# Patient Record
Sex: Male | Born: 1940 | Race: White | Hispanic: No | Smoking: Never smoker
Health system: Southern US, Community
[De-identification: ages and names within clinical notes are randomized; demographics above are authoritative.]

## PROBLEM LIST (undated history)

## (undated) DIAGNOSIS — E785 Hyperlipidemia, unspecified: Secondary | ICD-10-CM

## (undated) DIAGNOSIS — J849 Interstitial pulmonary disease, unspecified: Secondary | ICD-10-CM

## (undated) DIAGNOSIS — I639 Cerebral infarction, unspecified: Secondary | ICD-10-CM

## (undated) DIAGNOSIS — I714 Abdominal aortic aneurysm, without rupture, unspecified: Secondary | ICD-10-CM

## (undated) DIAGNOSIS — I219 Acute myocardial infarction, unspecified: Secondary | ICD-10-CM

## (undated) DIAGNOSIS — M1990 Unspecified osteoarthritis, unspecified site: Secondary | ICD-10-CM

## (undated) DIAGNOSIS — M810 Age-related osteoporosis without current pathological fracture: Secondary | ICD-10-CM

## (undated) DIAGNOSIS — I509 Heart failure, unspecified: Secondary | ICD-10-CM

## (undated) DIAGNOSIS — N289 Disorder of kidney and ureter, unspecified: Secondary | ICD-10-CM

## (undated) DIAGNOSIS — I2510 Atherosclerotic heart disease of native coronary artery without angina pectoris: Secondary | ICD-10-CM

## (undated) HISTORY — DX: Hyperlipidemia, unspecified: E78.5

## (undated) HISTORY — PX: CORONARY STENT PLACEMENT: SHX1402

## (undated) HISTORY — DX: Abdominal aortic aneurysm, without rupture: I71.4

## (undated) HISTORY — PX: JOINT REPLACEMENT: SHX530

## (undated) HISTORY — DX: Age-related osteoporosis without current pathological fracture: M81.0

## (undated) HISTORY — DX: Abdominal aortic aneurysm, without rupture, unspecified: I71.40

## (undated) HISTORY — DX: Disorder of kidney and ureter, unspecified: N28.9

## (undated) HISTORY — DX: Acute myocardial infarction, unspecified: I21.9

## (undated) HISTORY — DX: Heart failure, unspecified: I50.9

## (undated) HISTORY — PX: EYE SURGERY: SHX253

## (undated) HISTORY — PX: CORONARY ANGIOPLASTY: SHX604

## (undated) HISTORY — PX: KNEE SURGERY: SHX244

---

## 2008-11-09 ENCOUNTER — Ambulatory Visit: Payer: Self-pay | Admitting: Cardiology

## 2008-11-09 ENCOUNTER — Inpatient Hospital Stay (HOSPITAL_COMMUNITY): Admission: EM | Admit: 2008-11-09 | Discharge: 2008-11-13 | Payer: Self-pay | Admitting: Cardiology

## 2008-11-09 DIAGNOSIS — I219 Acute myocardial infarction, unspecified: Secondary | ICD-10-CM

## 2008-11-09 HISTORY — DX: Acute myocardial infarction, unspecified: I21.9

## 2008-11-11 ENCOUNTER — Encounter: Payer: Self-pay | Admitting: Cardiology

## 2008-11-21 ENCOUNTER — Ambulatory Visit: Payer: Self-pay | Admitting: Cardiology

## 2008-11-21 LAB — CONVERTED CEMR LAB
ALT: 17 units/L (ref 0–53)
AST: 17 units/L (ref 0–37)
Albumin: 3.5 g/dL (ref 3.5–5.2)
Alkaline Phosphatase: 52 units/L (ref 39–117)
BUN: 20 mg/dL (ref 6–23)
Basophils Absolute: 0.1 10*3/uL (ref 0.0–0.1)
Basophils Relative: 0.6 % (ref 0.0–3.0)
Bilirubin, Direct: 0.1 mg/dL (ref 0.0–0.3)
CO2: 29 meq/L (ref 19–32)
Calcium: 9.1 mg/dL (ref 8.4–10.5)
Chloride: 105 meq/L (ref 96–112)
Cholesterol: 132 mg/dL (ref 0–200)
Creatinine, Ser: 1.4 mg/dL (ref 0.4–1.5)
Eosinophils Absolute: 0.4 10*3/uL (ref 0.0–0.7)
Eosinophils Relative: 4.8 % (ref 0.0–5.0)
GFR calc Af Amer: 65 mL/min
GFR calc non Af Amer: 54 mL/min
Glucose, Bld: 210 mg/dL — ABNORMAL HIGH (ref 70–99)
HCT: 43 % (ref 39.0–52.0)
HDL: 33.7 mg/dL — ABNORMAL LOW (ref >39.0)
Hemoglobin: 15 g/dL (ref 13.0–17.0)
LDL Cholesterol: 70 mg/dL (ref 0–99)
Lymphocytes Relative: 22.2 % (ref 12.0–46.0)
MCHC: 34.9 g/dL (ref 30.0–36.0)
MCV: 93.6 fL (ref 78.0–100.0)
Monocytes Absolute: 0.9 10*3/uL (ref 0.1–1.0)
Monocytes Relative: 9.7 % (ref 3.0–12.0)
Neutro Abs: 5.7 10*3/uL (ref 1.4–7.7)
Neutrophils Relative %: 62.7 % (ref 43.0–77.0)
Platelets: 204 10*3/uL (ref 150–400)
Potassium: 4.4 meq/L (ref 3.5–5.1)
RBC: 4.6 M/uL (ref 4.22–5.81)
RDW: 12.3 % (ref 11.5–14.6)
Sodium: 138 meq/L (ref 135–145)
Total Bilirubin: 0.9 mg/dL (ref 0.3–1.2)
Total CHOL/HDL Ratio: 3.9
Total Protein: 7.2 g/dL (ref 6.0–8.3)
Triglycerides: 141 mg/dL (ref 0–149)
VLDL: 28 mg/dL (ref 0–40)
WBC: 9.1 10*3/uL (ref 4.5–10.5)

## 2008-12-02 ENCOUNTER — Encounter: Payer: Self-pay | Admitting: Cardiology

## 2008-12-02 ENCOUNTER — Ambulatory Visit: Payer: Self-pay

## 2008-12-02 ENCOUNTER — Ambulatory Visit: Payer: Self-pay | Admitting: Cardiology

## 2008-12-02 LAB — CONVERTED CEMR LAB
BUN: 16 mg/dL (ref 6–23)
CO2: 29 meq/L (ref 19–32)
Calcium: 9.2 mg/dL (ref 8.4–10.5)
Chloride: 104 meq/L (ref 96–112)
Creatinine, Ser: 1.3 mg/dL (ref 0.4–1.5)
GFR calc non Af Amer: 58.48 mL/min (ref >60)
Glucose, Bld: 208 mg/dL — ABNORMAL HIGH (ref 70–99)
INR: 1 (ref 0.8–1.0)
Potassium: 4.4 meq/L (ref 3.5–5.1)
Prothrombin Time: 11.1 s (ref 10.9–13.3)
Sodium: 140 meq/L (ref 135–145)

## 2008-12-06 ENCOUNTER — Inpatient Hospital Stay (HOSPITAL_COMMUNITY): Admission: RE | Admit: 2008-12-06 | Discharge: 2008-12-07 | Payer: Self-pay | Admitting: Cardiology

## 2008-12-06 ENCOUNTER — Ambulatory Visit: Payer: Self-pay | Admitting: Cardiology

## 2008-12-26 ENCOUNTER — Encounter: Payer: Self-pay | Admitting: Cardiology

## 2008-12-26 ENCOUNTER — Ambulatory Visit: Payer: Self-pay | Admitting: Cardiology

## 2008-12-26 DIAGNOSIS — I2510 Atherosclerotic heart disease of native coronary artery without angina pectoris: Secondary | ICD-10-CM

## 2009-01-27 ENCOUNTER — Ambulatory Visit: Payer: Self-pay | Admitting: Cardiology

## 2009-01-27 DIAGNOSIS — E7800 Pure hypercholesterolemia, unspecified: Secondary | ICD-10-CM

## 2009-01-27 DIAGNOSIS — E119 Type 2 diabetes mellitus without complications: Secondary | ICD-10-CM

## 2009-05-12 ENCOUNTER — Ambulatory Visit: Payer: Self-pay | Admitting: Cardiology

## 2009-05-21 ENCOUNTER — Telehealth: Payer: Self-pay | Admitting: Cardiology

## 2009-05-22 ENCOUNTER — Telehealth: Payer: Self-pay | Admitting: Cardiology

## 2009-06-11 ENCOUNTER — Telehealth: Payer: Self-pay | Admitting: Cardiology

## 2009-07-03 ENCOUNTER — Telehealth: Payer: Self-pay | Admitting: Cardiology

## 2009-07-19 ENCOUNTER — Encounter: Payer: Self-pay | Admitting: Cardiology

## 2009-07-28 ENCOUNTER — Telehealth: Payer: Self-pay | Admitting: Cardiology

## 2009-10-06 ENCOUNTER — Telehealth: Payer: Self-pay | Admitting: Cardiology

## 2009-10-09 ENCOUNTER — Encounter (INDEPENDENT_AMBULATORY_CARE_PROVIDER_SITE_OTHER): Payer: Self-pay

## 2009-10-09 ENCOUNTER — Ambulatory Visit: Payer: Self-pay | Admitting: Cardiology

## 2009-10-10 ENCOUNTER — Inpatient Hospital Stay (HOSPITAL_COMMUNITY): Admission: EM | Admit: 2009-10-10 | Discharge: 2009-10-14 | Payer: Self-pay | Admitting: Emergency Medicine

## 2009-10-10 ENCOUNTER — Ambulatory Visit: Payer: Self-pay | Admitting: Cardiovascular Disease

## 2009-10-14 ENCOUNTER — Encounter: Payer: Self-pay | Admitting: Cardiology

## 2009-10-14 ENCOUNTER — Telehealth: Payer: Self-pay | Admitting: Cardiovascular Disease

## 2009-10-15 ENCOUNTER — Telehealth: Payer: Self-pay | Admitting: Cardiology

## 2009-10-28 ENCOUNTER — Telehealth: Payer: Self-pay | Admitting: Cardiology

## 2009-11-06 ENCOUNTER — Ambulatory Visit: Payer: Self-pay | Admitting: Cardiology

## 2009-12-08 ENCOUNTER — Ambulatory Visit: Payer: Self-pay | Admitting: Cardiology

## 2009-12-09 LAB — CONVERTED CEMR LAB
BUN: 19 mg/dL (ref 6–23)
Basophils Absolute: 0 10*3/uL (ref 0.0–0.1)
Basophils Relative: 0.1 % (ref 0.0–3.0)
CO2: 27 meq/L (ref 19–32)
Calcium: 9.2 mg/dL (ref 8.4–10.5)
Chloride: 102 meq/L (ref 96–112)
Cholesterol: 126 mg/dL (ref 0–200)
Creatinine, Ser: 1.4 mg/dL (ref 0.4–1.5)
Direct LDL: 67.7 mg/dL
Eosinophils Absolute: 0.3 10*3/uL (ref 0.0–0.7)
Eosinophils Relative: 4 % (ref 0.0–5.0)
GFR calc non Af Amer: 53.53 mL/min (ref >60)
Glucose, Bld: 350 mg/dL — ABNORMAL HIGH (ref 70–99)
HCT: 49 % (ref 39.0–52.0)
HDL: 38.5 mg/dL — ABNORMAL LOW (ref >39.00)
Hemoglobin: 16.3 g/dL (ref 13.0–17.0)
Lymphocytes Relative: 23.8 % (ref 12.0–46.0)
Lymphs Abs: 1.7 10*3/uL (ref 0.7–4.0)
MCHC: 33.3 g/dL (ref 30.0–36.0)
MCV: 94.8 fL (ref 78.0–100.0)
Monocytes Absolute: 0.8 10*3/uL (ref 0.1–1.0)
Monocytes Relative: 11.1 % (ref 3.0–12.0)
Neutro Abs: 4.3 10*3/uL (ref 1.4–7.7)
Neutrophils Relative %: 61 % (ref 43.0–77.0)
Platelets: 172 10*3/uL (ref 150.0–400.0)
Potassium: 5.3 meq/L — ABNORMAL HIGH (ref 3.5–5.1)
RBC: 5.17 M/uL (ref 4.22–5.81)
RDW: 12.2 % (ref 11.5–14.6)
Sodium: 136 meq/L (ref 135–145)
Total CHOL/HDL Ratio: 3
Triglycerides: 236 mg/dL — ABNORMAL HIGH (ref 0.0–149.0)
VLDL: 47.2 mg/dL — ABNORMAL HIGH (ref 0.0–40.0)
WBC: 7.1 10*3/uL (ref 4.5–10.5)

## 2009-12-21 ENCOUNTER — Ambulatory Visit: Payer: Self-pay | Admitting: Internal Medicine

## 2009-12-21 ENCOUNTER — Telehealth: Payer: Self-pay | Admitting: Nurse Practitioner

## 2010-01-19 ENCOUNTER — Ambulatory Visit: Payer: Self-pay | Admitting: Cardiology

## 2010-05-04 ENCOUNTER — Ambulatory Visit: Payer: Self-pay | Admitting: Cardiology

## 2010-05-04 DIAGNOSIS — I959 Hypotension, unspecified: Secondary | ICD-10-CM

## 2010-05-14 LAB — CONVERTED CEMR LAB
BUN: 16 mg/dL (ref 6–23)
Basophils Absolute: 0.1 10*3/uL (ref 0.0–0.1)
Basophils Relative: 0.7 % (ref 0.0–3.0)
CO2: 26 meq/L (ref 19–32)
Calcium: 9.6 mg/dL (ref 8.4–10.5)
Chloride: 105 meq/L (ref 96–112)
Creatinine, Ser: 1.3 mg/dL (ref 0.4–1.5)
Eosinophils Absolute: 0.3 10*3/uL (ref 0.0–0.7)
Eosinophils Relative: 3.7 % (ref 0.0–5.0)
GFR calc non Af Amer: 57.72 mL/min (ref >60)
Glucose, Bld: 190 mg/dL — ABNORMAL HIGH (ref 70–99)
HCT: 44 % (ref 39.0–52.0)
Hemoglobin: 15.3 g/dL (ref 13.0–17.0)
Lymphocytes Relative: 23.8 % (ref 12.0–46.0)
Lymphs Abs: 2.2 10*3/uL (ref 0.7–4.0)
MCHC: 34.9 g/dL (ref 30.0–36.0)
MCV: 95.6 fL (ref 78.0–100.0)
Monocytes Absolute: 0.9 10*3/uL (ref 0.1–1.0)
Monocytes Relative: 10.2 % (ref 3.0–12.0)
Neutro Abs: 5.6 10*3/uL (ref 1.4–7.7)
Neutrophils Relative %: 61.6 % (ref 43.0–77.0)
Platelets: 192 10*3/uL (ref 150.0–400.0)
Potassium: 5 meq/L (ref 3.5–5.1)
RBC: 4.6 M/uL (ref 4.22–5.81)
RDW: 13.1 % (ref 11.5–14.6)
Sodium: 137 meq/L (ref 135–145)
WBC: 9 10*3/uL (ref 4.5–10.5)

## 2010-06-30 ENCOUNTER — Ambulatory Visit: Payer: Self-pay | Admitting: Cardiology

## 2010-06-30 ENCOUNTER — Ambulatory Visit (HOSPITAL_COMMUNITY): Admission: RE | Admit: 2010-06-30 | Discharge: 2010-06-30 | Payer: Self-pay | Admitting: Cardiology

## 2010-06-30 DIAGNOSIS — R05 Cough: Secondary | ICD-10-CM

## 2010-06-30 DIAGNOSIS — R059 Cough, unspecified: Secondary | ICD-10-CM | POA: Insufficient documentation

## 2010-07-01 ENCOUNTER — Telehealth: Payer: Self-pay | Admitting: Cardiology

## 2010-07-02 LAB — CONVERTED CEMR LAB
BUN: 26 mg/dL — ABNORMAL HIGH (ref 6–23)
Basophils Absolute: 0.1 10*3/uL (ref 0.0–0.1)
Basophils Relative: 0.7 % (ref 0.0–3.0)
CO2: 23 meq/L (ref 19–32)
Calcium: 9.3 mg/dL (ref 8.4–10.5)
Chloride: 107 meq/L (ref 96–112)
Creatinine, Ser: 1.8 mg/dL — ABNORMAL HIGH (ref 0.4–1.5)
Eosinophils Absolute: 0.4 10*3/uL (ref 0.0–0.7)
Eosinophils Relative: 3.4 % (ref 0.0–5.0)
GFR calc non Af Amer: 39.99 mL/min (ref >60)
Glucose, Bld: 197 mg/dL — ABNORMAL HIGH (ref 70–99)
HCT: 43.9 % (ref 39.0–52.0)
Hemoglobin: 15 g/dL (ref 13.0–17.0)
Lymphocytes Relative: 18.1 % (ref 12.0–46.0)
Lymphs Abs: 2.1 10*3/uL (ref 0.7–4.0)
MCHC: 34.3 g/dL (ref 30.0–36.0)
MCV: 95.6 fL (ref 78.0–100.0)
Monocytes Absolute: 0.8 10*3/uL (ref 0.1–1.0)
Monocytes Relative: 6.7 % (ref 3.0–12.0)
Neutro Abs: 8.2 10*3/uL — ABNORMAL HIGH (ref 1.4–7.7)
Neutrophils Relative %: 71.1 % (ref 43.0–77.0)
Platelets: 212 10*3/uL (ref 150.0–400.0)
Potassium: 4.4 meq/L (ref 3.5–5.1)
RBC: 4.59 M/uL (ref 4.22–5.81)
RDW: 12.5 % (ref 11.5–14.6)
Sodium: 137 meq/L (ref 135–145)
WBC: 11.5 10*3/uL — ABNORMAL HIGH (ref 4.5–10.5)

## 2010-07-28 ENCOUNTER — Encounter: Payer: Self-pay | Admitting: Cardiology

## 2010-08-14 ENCOUNTER — Encounter: Payer: Self-pay | Admitting: Cardiology

## 2010-08-14 ENCOUNTER — Ambulatory Visit: Payer: Self-pay | Admitting: Cardiology

## 2010-08-14 DIAGNOSIS — J984 Other disorders of lung: Secondary | ICD-10-CM

## 2010-08-20 ENCOUNTER — Inpatient Hospital Stay (HOSPITAL_COMMUNITY): Admission: EM | Admit: 2010-08-20 | Discharge: 2009-12-23 | Payer: Self-pay | Admitting: Emergency Medicine

## 2010-10-13 NOTE — Assessment & Plan Note (Signed)
Summary: f41m   Visit Type:  3 months follow up Primary Provider:  Mechele Collin  CC:  Low blood pressure.  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  CAD: No chest pain, SOB, orthopnea or PND. He does not report any swelling in extremities. He can walk >2blocks without getting any chest pain/SOB. 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  HTN: He has been having Low BP recently with an episode of dizzyness last week after working out in sun. His BP at that time was 72/50.   Diabetes is under better control; acc to him but he dioes not remember his last HBa1c. Hba1c in hospital in April was 10.4.  No other complaints.  Problems Prior to Update: 1)  Hypotension  (ICD-458.9) 2)  Aodm  (ICD-250.00) 3)  Aodm  (ICD-250.00) 4)  Hypercholesterolemia Iia  (ICD-272.0) 5)  Cad, Native Vessel  (ICD-414.01)  Medications Prior to Update: 1)  Toprol Xl 50 Mg Xr24h-Tab (Metoprolol Succinate) .... Take 1 Tablet By Mouth Once A Day 2)  Nitroglycerin 0.4 Mg Subl (Nitroglycerin) .... One Tablet Under Tongue Every 5 Minutes As Needed For Chest Pain---May Repeat Times Three 3)  Metformin Hcl 500 Mg Tabs (Metformin Hcl) .... Take 1 Tablet By Mouth Two Times A Day 4)  Levemir 100 Unit/ml Soln (Insulin Detemir) .... 40 Units Daily 5)  Simvastatin 40 Mg Tabs (Simvastatin) .... Take One Tablet By Mouth Daily At Bedtime 6)  Glipizide 10 Mg Tabs (Glipizide) .... Take 1 Tablet By Mouth Two Times A Day 7)  Isosorbide Mononitrate Cr 30 Mg Xr24h-Tab (Isosorbide Mononitrate) .... Take One-Half  Tablet By Mouth Daily 8)  Effient 10 Mg Tabs (Prasugrel Hcl) .... Take 1 Tablet By Mouth Once A Day  Current Medications (verified): 1)  Toprol Xl 50 Mg Xr24h-Tab (Metoprolol Succinate) .... Take 1 Tablet By Mouth Once A Day 2)   Nitroglycerin 0.4 Mg Subl (Nitroglycerin) .... One Tablet Under Tongue Every 5 Minutes As Needed For Chest Pain---May Repeat Times Three 3)  Metformin Hcl 500 Mg Tabs (Metformin Hcl) .... Take 1 Tablet By Mouth Two Times A Day 4)  Levemir 100 Unit/ml Soln (Insulin Detemir) .... 50 Units At Night and 10 Units Am. 5)  Simvastatin 40 Mg Tabs (Simvastatin) .... Take One Tablet By Mouth Daily At Bedtime 6)  Glipizide 10 Mg Tabs (Glipizide) .... Take 1 Tablet By Mouth Two Times A Day 7)  Isosorbide Mononitrate Cr 30 Mg Xr24h-Tab (Isosorbide Mononitrate) .... Take One-Half  Tablet By Mouth Daily 8)  Effient 10 Mg Tabs (Prasugrel Hcl) .... Take 1 Tablet By Mouth Once A Day 9)  Lisinopril 2.5 Mg Tabs (Lisinopril) .... Take One Tablet By Mouth Daily  Allergies (verified): No Known Drug Allergies  Past History:  Past Medical History: Last updated: 11/20/2008  1. Diabetes.   2. Dyslipidemia.   3. Renal insufficiency  4. Myocardial infarction on February 27/10    Past Surgical History: Last updated: 11/20/2008 Knee surgery  Family History: Last updated: 11/20/2008  Positive for CAD.  He has had 2 brothers, died from   acute MIs.   Social History: Last updated: 11/20/2008 The patient lives in [place] with his family.   He denies any tobacco use.   Family History: Reviewed history from 11/20/2008  and no changes required.  Positive for CAD.  He has had 2 brothers, died from   acute MIs.   Social History: Reviewed history from 11/20/2008 and no changes required. The patient lives in [place] with his family.   He denies any tobacco use.   Review of Systems      See HPI  Vital Signs:  Patient profile:   70 year old male Height:      69 inches Weight:      192.25 pounds BMI:     28.49 Pulse rate:   74 / minute Pulse rhythm:   regular Resp:     18 per minute BP sitting:   90 / 60  (left arm) Cuff size:   large  Vitals Entered By: Vikki Ports (May 04, 2010 9:55 AM)  Physical Exam  Additional Exam:  Gen: AOx3, in no acute distress Eyes: PERRL, EOMI ENT:MMM, No erythema noted in posterior pharynx Neck: No JVD, No LAP Chest: Bibasilar crackles,  good respiratory effort, no  CVS: regular rhythmic rate, NO M/R/G, S1 S2 normal Abdo: soft,ND, BS+x4, Non tender and No hepatosplenomegaly EXT: No odema noted Neuro: Non focal, gait is normal Skin: no rashes noted.    Cardiac Cath  Procedure date:  12/22/2009  Findings:      FINAL ASSESSMENT: 1. Severe ostial diagonal stenosis with successful cutting balloon     angioplasty. 2. Continued patency of the left anterior descending (coronary artery)     and obtuse marginal stents with otherwise nonobstructive coronary     artery disease.   RECOMMENDATIONS:  We will continue the patient's dual antiplatelet therapy with aspirin and Plavix and we will check P2Y12 platelet testing in the morning to see if the patient may benefit from prasugrel.  EKG  Procedure date:  05/04/2010  Findings:      NSR. RBBB.  Inferior MI, old.  LAFB.  Bifascicular block.  Impression & Recommendations:  Problem # 1:  CAD, NATIVE VESSEL (ICD-414.01) Assessment Unchanged No chest pain or SOB. On aspirin and prsugrel as he was Plavix non responder.  The following medications were removed from the medication list:    Isosorbide Mononitrate Cr 30 Mg Xr24h-tab (Isosorbide mononitrate) .Marland Kitchen... Take one-half  tablet by mouth daily His updated medication list for this problem includes:    Toprol Xl 50 Mg Xr24h-tab (Metoprolol succinate) .Marland Kitchen... Take 1 tablet by mouth once a day    Nitroglycerin 0.4 Mg Subl (Nitroglycerin) ..... One tablet under tongue every 5 minutes as needed for chest pain---may repeat times three    Effient 10 Mg Tabs (Prasugrel hcl) .Marland Kitchen... Take 1 tablet by mouth once a day  Orders: EKG w/ Interpretation (93000) TLB-BMP (Basic Metabolic Panel-BMET) (80048-METABOL) TLB-CBC Platelet -  w/Differential (85025-CBCD)  < Template too large >  Problem # 2:  HYPOTENSION (ICD-458.9) Assessment: New Patient has had a couple fo very low BP readings recently specially last week when dixxy along with BP 72/50. We will stop Isosorbide nitrate and Lisinopril at this time. Have him take his BP readings repeatedly in next 4 weeks adn follow up after 4 weeks. We will check his CBC as this could be related to Bleeding as he is on prasulgrel which has higher risk of bleeding as compared to plavix. Repeat BP checked manually was 104/68.  Problem # 3:  AODM (ICD-250.00) Assessment: Unchanged Managed by Dr Pollyann Kennedy up in [place], [place]. He says that he is improving with his CBG levels. His updated medication  list for this problem includes:    Metformin Hcl 500 Mg Tabs (Metformin hcl) .Marland Kitchen... Take 1 tablet by mouth two times a day    Levemir 100 Unit/ml Soln (Insulin detemir) .Marland KitchenMarland KitchenMarland KitchenMarland Kitchen 50 units at night and 10 units am.    Glipizide 10 Mg Tabs (Glipizide) .Marland Kitchen... Take 1 tablet by mouth two times a day  Orders: EKG w/ Interpretation (93000) TLB-BMP (Basic Metabolic Panel-BMET) (80048-METABOL) TLB-CBC Platelet - w/Differential (85025-CBCD)  Labs Reviewed: Creat: 1.4 (12/08/2009)     Problem # 4:  HYPERCHOLESTEROLEMIA  IIA (ICD-272.0) Assessment: Unchanged Continue Simvastatin 40. His updated medication list for this problem includes:    Simvastatin 40 Mg Tabs (Simvastatin) .Marland Kitchen... Take one tablet by mouth daily at bedtime  Orders: EKG w/ Interpretation (93000) TLB-BMP (Basic Metabolic Panel-BMET) (80048-METABOL) TLB-CBC Platelet - w/Differential (85025-CBCD)  CHOL: 126 (12/08/2009)   LDL: 70 (11/21/2008)   HDL: 38.50 (12/08/2009)   TG: 236.0 (12/08/2009)  Patient Instructions: 1)  Your physician recommends that you schedule a follow-up appointment in: 1 MONTH 2)  Your physician recommends that you have lab work today: CBC, BMP 3)  Your physician has recommended you make the following change  in your medication: STOP Lisinopril, STOP Isosorbide MN  Vital Signs:  Patient profile:   70 year old male Height:      69 inches Weight:      192.25 pounds BMI:     28.49 Pulse rate:   74 / minute Pulse rhythm:   regular Resp:     18 per minute BP sitting:   90 / 60  (left arm) Cuff size:   large  Vitals Entered By: Vikki Ports (May 04, 2010 9:55 AM)   Problems Prior to Update: 1)  Hypotension  (ICD-458.9) 2)  Aodm  (ICD-250.00) 3)  Aodm  (ICD-250.00) 4)  Hypercholesterolemia Iia  (ICD-272.0) 5)  Cad, Native Vessel  (ICD-414.01)  Medications Prior to Update: 1)  Toprol Xl 50 Mg Xr24h-Tab (Metoprolol Succinate) .... Take 1 Tablet By Mouth Once A Day 2)  Nitroglycerin 0.4 Mg Subl (Nitroglycerin) .... One Tablet Under Tongue Every 5 Minutes As Needed For Chest Pain---May Repeat Times Three 3)  Metformin Hcl 500 Mg Tabs (Metformin Hcl) .... Take 1 Tablet By Mouth Two Times A Day 4)  Levemir 100 Unit/ml Soln (Insulin Detemir) .... 40 Units Daily 5)  Simvastatin 40 Mg Tabs (Simvastatin) .... Take One Tablet By Mouth Daily At Bedtime 6)  Glipizide 10 Mg Tabs (Glipizide) .... Take 1 Tablet By Mouth Two Times A Day 7)  Isosorbide Mononitrate Cr 30 Mg Xr24h-Tab (Isosorbide Mononitrate) .... Take One-Half  Tablet By Mouth Daily 8)  Effient 10 Mg Tabs (Prasugrel Hcl) .... Take 1 Tablet By Mouth Once A Day  Current Medications (verified): 1)  Toprol Xl 50 Mg Xr24h-Tab (Metoprolol Succinate) .... Take 1 Tablet By Mouth Once A Day 2)  Nitroglycerin 0.4 Mg Subl (Nitroglycerin) .... One Tablet Under Tongue Every 5 Minutes As Needed For Chest Pain---May Repeat Times Three 3)  Metformin Hcl 500 Mg Tabs (Metformin Hcl) .... Take 1 Tablet By Mouth Two Times A Day 4)  Levemir 100 Unit/ml Soln (Insulin Detemir) .... 50 Units At Night and 10 Units Am. 5)  Simvastatin 40 Mg Tabs (Simvastatin) .... Take One Tablet By Mouth Daily At Bedtime 6)  Glipizide 10 Mg Tabs (Glipizide) .... Take 1 Tablet  By Mouth Two Times A Day 7)  Isosorbide Mononitrate Cr 30 Mg Xr24h-Tab (Isosorbide Mononitrate) .... Take One-Half  Tablet By  Mouth Daily 8)  Effient 10 Mg Tabs (Prasugrel Hcl) .... Take 1 Tablet By Mouth Once A Day 9)  Lisinopril 2.5 Mg Tabs (Lisinopril) .... Take One Tablet By Mouth Daily  Allergies (verified): No Known Drug Allergies   Patient Instructions: 1)  Your physician recommends that you schedule a follow-up appointment in: 1 MONTH 2)  Your physician recommends that you have lab work today: CBC, BMP 3)  Your physician has recommended you make the following change in your medication: STOP Lisinopril, STOP Isosorbide MN  Appended Document: f11m Dr Eben Burow and I saw the patient together.  He was evaluated, and I agree with the notes. CBC and BMET were checked, and were satisfactory.  TS

## 2010-10-13 NOTE — Progress Notes (Signed)
Summary: Cardiology Phone Note - Chest Pain  Phone Note Call from Patient   Caller: Spouse Summary of Call: received call from ms. Theroux on the evening of 4/9, stating that her husband, the patient, developed c/p @ rest not relieved after 2 sl ntg tabs.  i advised that they call 911 immediatley for transport to their local hospital [facility]).  She agreed to call 911 and was grateful for the call back. Initial call taken by: Creig Hines, ANP-BC,  December 21, 2009 10:20 AM

## 2010-10-13 NOTE — Progress Notes (Signed)
Summary: Med Change  Phone Note Call from Patient   Caller: Lucas Morgan's wife Summary of Call: I spoke with the Lucas Morgan's wife and the Lucas Morgan is taking Lisinopril and had stopped Toprol XL.  I instructed her to STOP Lisinopril and START Toprol XL.  Rx sent to pharmacy.  I will have Dr Riley Kill review the Lucas Morgan's chest x-ray and labs.  Initial call taken by: Julieta Gutting, RN, BSN,  July 01, 2010 9:28 AM  Follow-up for Phone Call        Lucas Morgan wife states pharmacy says rx has not been called in. Lucas Morgan wife wants to know lab results. Follow-up by: Roe Coombs,  July 01, 2010 1:13 PM  Additional Follow-up for Phone Call Additional follow up Details #1::        Prescription called into the pharmacy. Julieta Gutting, RN, BSN  July 01, 2010 2:19 PM     Additional Follow-up for Phone Call Additional follow up Details #2::    Dr Riley Kill called and spoke with the Lucas Morgan's wife about lab and chest x-ray results.  Dr Riley Kill would like the Lucas Morgan to start Levaquin 500mg  by mouth once daily for 7 days #7, RF O.  Rx called to the pharmacy. Dr Riley Kill will call the Lucas Morgan's PCP tomorrow to arrange OV and CT scan.  Follow-up by: Julieta Gutting, RN, BSN,  July 01, 2010 6:58 PM  New/Updated Medications: LEVAQUIN 500 MG TABS (LEVOFLOXACIN) take one tablet by mouth daily for 7 days Prescriptions: TOPROL XL 50 MG XR24H-TAB (METOPROLOL SUCCINATE) Take 1 tablet by mouth once a day  #30 x 6   Entered by:   Julieta Gutting, RN, BSN   Authorized by:   Ronaldo Miyamoto, MD, [facility]   Signed by:   Julieta Gutting, RN, BSN on 07/01/2010   Method used:   Electronically to        [facility] [address] (retail)       [address] [place]  [address]       Ph: [phone]       Fax: [phone]   RxID:   [id number]

## 2010-10-13 NOTE — Assessment & Plan Note (Signed)
Summary: 6wk f/u   Visit Type:  POST-HOSPITAL Primary Provider:  Mechele Collin  CC:  No complains.  History of Present Illness: Feeling really good at this point.  No chest pain.  Mowing grass.  Rides a riding mower.     Current Medications (verified): 1)  Toprol Xl 50 Mg Xr24h-Tab (Metoprolol Succinate) .... Take 1 Tablet By Mouth Once A Day 2)  Nitroglycerin 0.4 Mg Subl (Nitroglycerin) .... One Tablet Under Tongue Every 5 Minutes As Needed For Chest Pain---May Repeat Times Three 3)  Metformin Hcl 500 Mg Tabs (Metformin Hcl) .... Take 1 Tablet By Mouth Two Times A Day 4)  Levemir 100 Unit/ml Soln (Insulin Detemir) .... 40 Units Daily 5)  Simvastatin 40 Mg Tabs (Simvastatin) .... Take One Tablet By Mouth Daily At Bedtime 6)  Glipizide 10 Mg Tabs (Glipizide) .... Take 1 Tablet By Mouth Two Times A Day 7)  Isosorbide Mononitrate Cr 30 Mg Xr24h-Tab (Isosorbide Mononitrate) .... Take One-Half  Tablet By Mouth Daily 8)  Effient 10 Mg Tabs (Prasugrel Hcl) .... Take 1 Tablet By Mouth Once A Day  Allergies (verified): No Known Drug Allergies  Vital Signs:  Patient profile:   70 year old male Height:      69 inches Weight:      196 pounds BMI:     29.05 Pulse rate:   70 / minute Pulse rhythm:   regular Resp:     18 per minute BP sitting:   102 / 68  (left arm) Cuff size:   large  Vitals Entered By: Vikki Ports (Jan 19, 2010 12:34 PM)  Physical Exam  General:  Well developed, well nourished, in no acute distress. Head:  normocephalic and atraumatic Eyes:  PERRLA/EOM intact; conjunctiva and lids normal. Ears:  TM's intact and clear with normal canals and hearing Lungs:  Clear bilaterally to auscultation and percussion. Heart:  PMI non displaced.  Normal S1 and S2.  No def murmur.   Abdomen:  Bowel sounds positive; abdomen soft and non-tender without masses, organomegaly, or hernias noted. No hepatosplenomegaly. Extremities:  No clubbing or cyanosis. Neurologic:  Alert and oriented x  3.   EKG  Procedure date:  01/19/2010  Findings:      NSR.  RBBB.  LAFB.  Inferior MI, old.    Impression & Recommendations:  Problem # 1:  CAD, NATIVE VESSEL (ICD-414.01)  Doing well.  No evidence of bleeding.  Tolerating meds well.  Had diagonal dilated through stent, and good results without recurrent symptoms.  The following medications were removed from the medication list:    Aspirin 81 Mg Tbec (Aspirin) .Marland Kitchen... Take one tablet by mouth daily    Plavix 75 Mg Tabs (Clopidogrel bisulfate) .Marland Kitchen... Take one tablet by mouth daily His updated medication list for this problem includes:    Toprol Xl 50 Mg Xr24h-tab (Metoprolol succinate) .Marland Kitchen... Take 1 tablet by mouth once a day    Nitroglycerin 0.4 Mg Subl (Nitroglycerin) ..... One tablet under tongue every 5 minutes as needed for chest pain---may repeat times three    Isosorbide Mononitrate Cr 30 Mg Xr24h-tab (Isosorbide mononitrate) .Marland Kitchen... Take one-half  tablet by mouth daily    Effient 10 Mg Tabs (Prasugrel hcl) .Marland Kitchen... Take 1 tablet by mouth once a day  Orders: EKG w/ Interpretation (93000)  Problem # 2:  HYPERCHOLESTEROLEMIA  IIA (ICD-272.0)  followed by Ms. Elliott in [place].  Encourage patient compliance.   His updated medication list for this problem includes:  Simvastatin 40 Mg Tabs (Simvastatin) .Marland Kitchen... Take one tablet by mouth daily at bedtime  His updated medication list for this problem includes:    Simvastatin 40 Mg Tabs (Simvastatin) .Marland Kitchen... Take one tablet by mouth daily at bedtime  Orders: EKG w/ Interpretation (93000)  Problem # 3:  AODM (ICD-250.00) stress importance of diabetes control on patient.   The following medications were removed from the medication list:    Aspirin 81 Mg Tbec (Aspirin) .Marland Kitchen... Take one tablet by mouth daily His updated medication list for this problem includes:    Metformin Hcl 500 Mg Tabs (Metformin hcl) .Marland Kitchen... Take 1 tablet by mouth two times a day    Levemir 100 Unit/ml Soln (Insulin  detemir) .Marland KitchenMarland KitchenMarland KitchenMarland Kitchen 40 units daily    Glipizide 10 Mg Tabs (Glipizide) .Marland Kitchen... Take 1 tablet by mouth two times a day  Patient Instructions: 1)  Your physician recommends that you schedule a follow-up appointment in: 3 MONTHS 2)  Your physician recommends that you continue on your current medications as directed. Please refer to the Current Medication list given to you today.

## 2010-10-13 NOTE — Progress Notes (Signed)
Summary: calling with question about medication  Phone Note Call from Patient Call back at Home Phone [phone]   Caller: Spouse/Barbara Summary of Call: Pt wife calling regarding the pt medication Novolog Initial call taken by: Judie Grieve,  October 15, 2009 8:54 AM  Follow-up for Phone Call        Wife had a question about what insulin the pt was taking before he went in the hospital and what the hospital had him on. I ask her to call his PCP to get that straightened out. She understands.  Follow-up by: Duncan Dull, RN, BSN,  October 15, 2009 9:14 AM

## 2010-10-13 NOTE — Progress Notes (Signed)
Summary: "Spells"  Phone Note Call from Patient Call back at Home Phone [phone]   Caller: Spouse/ Reason for Call: Talk to Nurse Summary of Call: pt having weakness... only happens with activity Initial call taken by: Migdalia Dk,  October 06, 2009 10:05 AM  Follow-up for Phone Call        I spoke with the pt and he has been having "spells" at least once a week for the past 2 months.  The spells come and go and last about 10 minutes.  The pt does get pale with these episodes. The pt c/o chest tightness and pressure (5/10).  The pt said it feels like indigestion and he feels like he needs to burp.  The pt will lay down and the tightness goes away.  The pt has not taken NTG during these symptoms.  The pt's last "spell" was Friday and he has been doing fine otherwise.  I attempted to make the pt an appt with the DOD but he would like to see Dr Riley Kill.  I will call the pt with an appt for this week after reviewing Dr Rosalyn Charters schedule. Follow-up by: Julieta Gutting, RN, BSN,  October 06, 2009 10:35 AM  Additional Follow-up for Phone Call Additional follow up Details #1::        I arranged appt for the pt to see Dr Riley Kill on 10/09/09 at 3:15.  Pt's wife aware. Additional Follow-up by: Julieta Gutting, RN, BSN,  October 07, 2009 9:38 AM

## 2010-10-13 NOTE — Assessment & Plan Note (Signed)
Summary: 4wk f/u    Visit Type:  4 weeks follow up Primary Provider:  Mechele Collin  CC:  No cardiac complains.  History of Present Illness: Currently he is doing well, and doing ok per wife as well.  Has not noted any blood in stool.  When he was not on isosorbide, he was having some chest pain, which improved when we went up on isosorbide.    Current Medications (verified): 1)  Toprol Xl 50 Mg Xr24h-Tab (Metoprolol Succinate) .... Take 1 Tablet By Mouth Once A Day 2)  Nitroglycerin 0.4 Mg Subl (Nitroglycerin) .... One Tablet Under Tongue Every 5 Minutes As Needed For Chest Pain---May Repeat Times Three 3)  Aspirin Ec 325 Mg Tbec (Aspirin) .... Take One Tablet By Mouth Daily 4)  Plavix 75 Mg Tabs (Clopidogrel Bisulfate) .... Take One Tablet By Mouth Daily 5)  Metformin Hcl 500 Mg Tabs (Metformin Hcl) .... Take 1 Tablet By Mouth Two Times A Day 6)  Novolin 70/30 70-30 % Susp (Insulin Isophane & Regular) .... 30 Units At Bedtime 7)  Simvastatin 40 Mg Tabs (Simvastatin) .... Take One Tablet By Mouth Daily At Bedtime 8)  Glipizide 10 Mg Tabs (Glipizide) .... Take 1 Tablet By Mouth Two Times A Day 9)  Isosorbide Mononitrate Cr 30 Mg Xr24h-Tab (Isosorbide Mononitrate) .... Take One Tablet By Mouth Daily  Allergies (verified): No Known Drug Allergies  Vital Signs:  Patient profile:   70 year old male Height:      69 inches Weight:      196.50 pounds BMI:     29.12 Pulse rate:   83 / minute Pulse rhythm:   regular Resp:     18 per minute BP sitting:   83 / 53  (left arm) Cuff size:   large  Vitals Entered By: Vikki Ports (December 08, 2009 9:17 AM)  Physical Exam  General:  Well developed, well nourished, in no acute distress. Head:  normocephalic and atraumatic Eyes:  PERRLA/EOM intact; conjunctiva and lids normal. Lungs:  Clear bilaterally to auscultation and percussion.  Some expiratory wheezes.   Heart:  Non-displaced PMI, chest non-tender; regular rate and rhythm, S1, S2 without  murmurs, rubs or gallops. Carotid upstroke normal, no bruit.  Abdomen:  Bowel sounds positive; abdomen soft and non-tender without masses, organomegaly, or hernias noted. No hepatosplenomegaly. Extremities:  No clubbing or cyanosis. Neurologic:  Alert and oriented x 3.   Impression & Recommendations:  Problem # 1:  CAD, NATIVE VESSEL (ICD-414.01)   No symptoms, but BP low.  He does not feel it.  No angina.  Therefore, will cut down medications at present and reduce isosorbide to 15 mg.  Also reduce ASA to 81mg , and check CBC to make sure not anemic. His updated medication list for this problem includes:    Toprol Xl 50 Mg Xr24h-tab (Metoprolol succinate) .Marland Kitchen... Take 1 tablet by mouth once a day    Nitroglycerin 0.4 Mg Subl (Nitroglycerin) ..... One tablet under tongue every 5 minutes as needed for chest pain---may repeat times three    Aspirin Ec 325 Mg Tbec (Aspirin) .Marland Kitchen... Take one tablet by mouth daily    Plavix 75 Mg Tabs (Clopidogrel bisulfate) .Marland Kitchen... Take one tablet by mouth daily    Isosorbide Mononitrate Cr 30 Mg Xr24h-tab (Isosorbide mononitrate) .Marland Kitchen... Take one tablet by mouth daily  Orders: TLB-CBC Platelet - w/Differential (85025-CBCD) TLB-BMP (Basic Metabolic Panel-BMET) (80048-METABOL) TLB-Lipid Panel (80061-LIPID)  Problem # 2:  HYPERCHOLESTEROLEMIA  IIA (ICD-272.0)  Check lipid and  liver profile.  His updated medication list for this problem includes:    Simvastatin 40 Mg Tabs (Simvastatin) .Marland Kitchen... Take one tablet by mouth daily at bedtime  His updated medication list for this problem includes:    Simvastatin 40 Mg Tabs (Simvastatin) .Marland Kitchen... Take one tablet by mouth daily at bedtime  Orders: TLB-CBC Platelet - w/Differential (85025-CBCD) TLB-BMP (Basic Metabolic Panel-BMET) (80048-METABOL) TLB-Lipid Panel (80061-LIPID)  Problem # 3:  AODM (ICD-250.00)  Check glucose. His updated medication list for this problem includes:    Aspirin 81 Mg Tbec (Aspirin) .Marland Kitchen... Take one  tablet by mouth daily    Metformin Hcl 500 Mg Tabs (Metformin hcl) .Marland Kitchen... Take 1 tablet by mouth two times a day    Novolin 70/30 70-30 % Susp (Insulin isophane & regular) .Marland KitchenMarland KitchenMarland KitchenMarland Kitchen 30 units at bedtime    Glipizide 10 Mg Tabs (Glipizide) .Marland Kitchen... Take 1 tablet by mouth two times a day  His updated medication list for this problem includes:    Aspirin 81 Mg Tbec (Aspirin) .Marland Kitchen... Take one tablet by mouth daily    Metformin Hcl 500 Mg Tabs (Metformin hcl) .Marland Kitchen... Take 1 tablet by mouth two times a day    Novolin 70/30 70-30 % Susp (Insulin isophane & regular) .Marland KitchenMarland KitchenMarland KitchenMarland Kitchen 30 units at bedtime    Glipizide 10 Mg Tabs (Glipizide) .Marland Kitchen... Take 1 tablet by mouth two times a day  Patient Instructions: 1)  Your physician recommends that you schedule a follow-up appointment in: 6 WEEKS 2)  Your physician recommends that you have lab work today: CBC, BMP, LIPID 3)  Your physician has recommended you make the following change in your medication: DEREASE Aspirin to 81mg  once a day, DECREASE Isosorbide MN to 30mg  one-half tablet daily 4)  Your physician has requested that you regularly monitor and record your blood pressure readings at home.  Please use the same machine at the same time of day to check your readings and record them to bring to your follow-up visit. Prescriptions: ISOSORBIDE MONONITRATE CR 30 MG XR24H-TAB (ISOSORBIDE MONONITRATE) Take one-half  tablet by mouth daily  #30 x 6   Entered by:   Julieta Gutting, RN, BSN   Authorized by:   Ronaldo Miyamoto, MD, [facility]   Signed by:   Julieta Gutting, RN, BSN on 12/08/2009   Method used:   Electronically to        [facility] [address]ail[facility]       [address], [place]  [address]       Ph: [phone]       Fax: [phone]   RxID:   [id number]

## 2010-10-13 NOTE — Assessment & Plan Note (Signed)
Summary: chest pain   Visit Type:  Follow-up Primary Provider:  Mechele Collin  CC:  Chest tightness.  History of Present Illness: Had one episode of chest pain, but he forgot and took the wrong medication.  He otherwise is without symptoms.  He underwent cath, and has high grade diagonal, with evidence of ischemia on radionuclear imaging.  However symptoms are minimal.  Dr. Excell Seltzer, Dr. Juanda Chance, and I reviewed films and leaned toward medical treatment if symptoms controlled as the diag dilitation likely would result in some stent deformation.    Current Medications (verified): 1)  Toprol Xl 50 Mg Xr24h-Tab (Metoprolol Succinate) .... Take 1 Tablet By Mouth Once A Day 2)  Nitroglycerin 0.4 Mg Subl (Nitroglycerin) .... One Tablet Under Tongue Every 5 Minutes As Needed For Chest Pain---May Repeat Times Three 3)  Aspirin Ec 325 Mg Tbec (Aspirin) .... Take One Tablet By Mouth Daily 4)  Plavix 75 Mg Tabs (Clopidogrel Bisulfate) .... Take One Tablet By Mouth Daily 5)  Metformin Hcl 500 Mg Tabs (Metformin Hcl) .... Take 1 Tablet By Mouth Two Times A Day 6)  Novolin 70/30 70-30 % Susp (Insulin Isophane & Regular) .... 30 Units At Bedtime 7)  Simvastatin 40 Mg Tabs (Simvastatin) .... Take One Tablet By Mouth Daily At Bedtime 8)  Glipizide 10 Mg Tabs (Glipizide) .... Take 1 Tablet By Mouth Two Times A Day 9)  Isosorbide Mononitrate Cr 30 Mg Xr24h-Tab (Isosorbide Mononitrate) .... Take One Tablet By Mouth Daily  Allergies (verified): No Known Drug Allergies  Vital Signs:  Patient profile:   70 year old male Height:      69 inches Weight:      199.50 pounds BMI:     29.57 Pulse rate:   70 / minute Pulse rhythm:   regular Resp:     18 per minute BP sitting:   107 / 60  (left arm) Cuff size:   large  Vitals Entered By: Vikki Ports (November 06, 2009 9:56 AM)  Physical Exam  General:  Well developed, well nourished, in no acute distress. Lungs:  Slight decrease chest sounds.   Slight ronchii R  base. Heart:  Non-displaced PMI, chest non-tender; regular rate and rhythm, S1, S2 without murmurs, rubs or gallops. Carotid upstroke normal, no bruit.  Abdomen:  Bowel sounds positive; abdomen soft and non-tender without masses, organomegaly, or hernias noted. No hepatosplenomegaly. Extremities:  No clubbing or cyanosis. Neurologic:  Alert and oriented x 3.   EKG  Procedure date:  11/06/2009  Findings:      NSR.  Inferior MI.  RBBB.   Cardiac Cath  Procedure date:  10/13/2009  Findings:       CONCLUSION:  Coronary artery disease status post prior percutaneous   coronary intervention procedures as described above with 0% stenosis at   the stent site in the proximal left anterior descending with 90% ostial   stenosis of a jailed diagonal branch, 0% stenosis in the stent site in   the circumflex marginal vessel, and 70% narrowing in a small nondominant   right coronary artery with mid inferior wall hypokinesis and an   estimated fraction of 55%.      RECOMMENDATIONS:  I think the culprit is likely the diagonal branch   which is jailed by the stent in the proximal LAD.  This is a difficult   lesion to treat percutaneously.  The takeoff of the ostial lesion would   make crossing difficult.  In order to fix the lesion, it  probably would   require an additional stent which the patient would need bifurcation   stenting with 2 stents in the LAD and diagonal branch.  Before   considering taking this on, we will plan   to evaluate him with a Myoview scan and try initial medical therapy.  If   he has recurrent symptoms and his Myoview scan shows ischemia in the   distribution of his diagonal branch, them we may consider percutaneous   intervention.         Nuclear ETT  Procedure date:  10/14/2009  Findings:       Mildly decreased left ventricular ejection fraction of 43% is   calculated on gated SPECT images at stress.   This is derived from an end-diastolic volume calculation of  129 ml   and end-systolic volume of 73 ml.   Wall motion analysis reveals mild hypokinesia of the inferior and   lateral walls of the left ventricle.    IMPRESSION:   Large perfusion defect identified at the lateral and inferior walls   of the left ventricle with evidence of significant reperfusion at   the lateral wall portion of the defect on resting exam.   Mildly decreased left ventricle ejection fraction of 43%.   Hypokinesia of the lateral and inferior walls of the left   ventricle.    Read By:  Lollie Marrow,  M.D.  Impression & Recommendations:  Problem # 1:  CAD, NATIVE VESSEL (ICD-414.01)  Stable symptoms.  No current complaints.  Long discussion and reviewed angios today in office.  Continue medical therapy.  Films reviewed with colleaugues and patient.  Nature of PCI, risks and benefits, and downsides reviewed.  He has no current symptoms other than mild on meds, and would prefer to continue this approach at present.  Continue close followup. His updated medication list for this problem includes:    Toprol Xl 50 Mg Xr24h-tab (Metoprolol succinate) .Marland Kitchen... Take 1 tablet by mouth once a day    Nitroglycerin 0.4 Mg Subl (Nitroglycerin) ..... One tablet under tongue every 5 minutes as needed for chest pain---may repeat times three    Aspirin Ec 325 Mg Tbec (Aspirin) .Marland Kitchen... Take one tablet by mouth daily    Plavix 75 Mg Tabs (Clopidogrel bisulfate) .Marland Kitchen... Take one tablet by mouth daily    Isosorbide Mononitrate Cr 30 Mg Xr24h-tab (Isosorbide mononitrate) .Marland Kitchen... Take one tablet by mouth daily  Orders: EKG w/ Interpretation (93000)  Problem # 2:  HYPERCHOLESTEROLEMIA  IIA (ICD-272.0)  Continue same. His updated medication list for this problem includes:    Simvastatin 40 Mg Tabs (Simvastatin) .Marland Kitchen... Take one tablet by mouth daily at bedtime  Orders: EKG w/ Interpretation ([address])  Patient Instructions: 1)  Your physician recommends that you schedule a follow-up appointment in: 4  WEEKS 2)  Your physician recommends that you continue on your current medications as directed. Please refer to the Current Medication list given to you today.

## 2010-10-13 NOTE — Progress Notes (Signed)
Summary: chest pain   Phone Note Call from Patient Call back at Home Phone [phone]   Caller: Spouse Summary of Call: has chest tightness and burning Initial call taken by: Migdalia Dk,  October 28, 2009 4:20 PM  Follow-up for Phone Call        isosorbide 30mg  daily, plavix 75 mg , metotprolol 50 mg, sl ntg  chest tightness in center of chest started 1 hour ago, no SOB N/V or any other s/s.  per wife pt hasnt tried the SL ntg yet but will.  Reviewed instructions on how to use nitroglycerin and will call back to check on him.  recent cath and the attempt was to treat him medically. Follow-up by: Charolotte Capuchin, RN,  October 28, 2009 4:26 PM  Additional Follow-up for Phone Call Additional follow up Details #1::        PER WIFE - TIGHTNESS WAS RELEAVED BY ONE SL NTG.  THEY HAVE NOT TAKEN HIS BP BUT WILL START CHECKING IT SO THAT DR Kaila Devries WILL KNOW WHAT IT IS IF PT'S ISOSORBIDE NEEDS TO BE INCREASED.  WILL SEND TO DR Riley Kill AND LAUREN FOR FOLLOW UP. Additional Follow-up by: Charolotte Capuchin, RN,  October 28, 2009 5:13 PM    Additional Follow-up for Phone Call Additional follow up Details #2::    I discussed this pt with Dr Riley Kill and he would like  to see this pt on his next scheduled day in the office.  The pt will also increase Isosorbide MN to 60mg  daily and start monitoring his BP.  I spoke with the pt's wife and arranged a follow-up appt on 11/06/09.  The pt's wife will check his BP tomorrow morning before giving his medications and call me with this reading.  Julieta Gutting, RN, BSN  October 28, 2009 5:57 PM  Additional Follow-up for Phone Call Additional follow up Details #3:: Details for Additional Follow-up Action Taken: Reviewed with Ms. Manson Passey and agree.  Any increase in symptoms and he should come for evaluation.  Additional Follow-up by: Ronaldo Miyamoto, MD, [facility],  October 29, 2009 8:47 AM  New/Updated Medications: ISOSORBIDE MONONITRATE CR 60 MG  XR24H-TAB (ISOSORBIDE MONONITRATE) Take one tablet by mouth daily Prescriptions: ISOSORBIDE MONONITRATE CR 60 MG XR24H-TAB (ISOSORBIDE MONONITRATE) Take one tablet by mouth daily  #30 x 3   Entered by:   Julieta Gutting, RN, BSN   Authorized by:   Ronaldo Miyamoto, MD, [facility]   Signed by:   Julieta Gutting, RN, BSN on 10/28/2009   Method used:   [facility] [address]       [address], [place]  [address]       Ph: [phone]       Fax: [phone]   RxID:   [id number]   Appended Document: chest pain  I called and spoke with the pt's wife to see how the pt's BP was this morning and it was in the 120s.  The pt's wife noticed last night that the pt mixed up his medications yesterday.  The pt's wife has his meds seperated into morning and evening and she noticed that the pt took his evening medications as his morning medications, so the pt did not take his Isosorbide yesterday morning.  This is the first time the pt has had chest tightness since leaving the hospital.  I told the pt's wife that missing that  dose could have caused his chest tightness.  At this time I will have the pt remain on Isosorbide 30mg  daily since the pt had symptoms after missing his dose.  I told the pt's wife to call the office if her husband had any further symptoms prior to his Dr Riley Kill appt on 11/06/09. Pt's wife agrees with plan.   Clinical Lists Changes  Medications: Changed medication from ISOSORBIDE MONONITRATE CR 60 MG XR24H-TAB (ISOSORBIDE MONONITRATE) Take one tablet by mouth daily to ISOSORBIDE MONONITRATE CR 30 MG XR24H-TAB (ISOSORBIDE MONONITRATE) Take one tablet by mouth daily      Appended Document: chest pain  reviewed in detail with nursing and patient at follow up.

## 2010-10-13 NOTE — Progress Notes (Signed)
Summary: FYI  Phone Note Call from Patient   Caller: Spouse Reason for Call: Acute Illness Summary of Call: please make Dr Excell Seltzer aware that they are still at the hospital awaiting dc Initial call taken by: Migdalia Dk,  October 14, 2009 3:51 PM  Follow-up for Phone Call        10/14/09-4pm--dr Benjamyn Hestand already addressed problem--nt Follow-up by: Ledon Snare, RN,  October 14, 2009 3:55 PM

## 2010-10-13 NOTE — Letter (Signed)
Summary: Cardiac Catheterization Instructions- [facility]  [facility], Main Office  [address]   [place], [place] [address]   Phone: [phone]  Fax: [phone]     10/09/2009 MRN: [id number]  Lucas Morgan [address], [place]  [address]  Dear Mr. Bayly,   You are scheduled for a Cardiac Catheterization on Monday October 20, 2009 with Dr. Riley Kill.  Please arrive to the 1st floor of the [facility] at [facility] at 6:30 am on the day of your procedure. Please do not arrive before 6:30 a.m. Call the Heart and Vascular Center at [phone] if you are unable to make your appointmnet. The Code to get into the parking garage under the building is 0900. Take the elevators to the 1st floor. You must have someone to drive you home. Someone must be with you for the first 24 hours after you arrive home. Please wear clothes that are easy to get on and off and wear slip-on shoes. Do not eat or drink after midnight except water with your medications that morning. (PLEASE DRINK A GLASS OF JUICE PRIOR TO PROCEDURE) Bring all your medications and current insurance cards with you.  _X__ DO NOT take these medications before your procedure: DO NOT TAKE GLIPIZIDE THE MORNING OF PROCEDURE.  TAKE HALF OF YOUR NOVOLIN DOSE THE NIGHT PRIOR TO PROCEDURE.  DO NOT TAKE METFORMIN THE NIGHT BEFORE, MORNING OF, OR 48 HOURS AFTER PROCEDURE.  _x__ Make sure you take your aspirin and plavix.  _x__ You may take ALL of your medications with water that morning.   PLEASE COME INTO THE OFFICE ON 10/15/09 OR 10/16/09 FOR PRE-PROCEDURE BLOODWORK.  LAB HOURS 8:30-2:00 AND 2:30-5:00  The usual length of stay after your procedure is 2 to 3 hours. This can vary.  If you have any questions, please call the office at the number listed above.   Julieta Gutting, RN, BSN

## 2010-10-13 NOTE — Assessment & Plan Note (Signed)
Summary: ROV   Visit Type:  Follow-up Primary Provider:  Mechele Collin  CC:  Chest pressure- tightness and spells.  History of Present Illness: About two months he noticed that he was getting tight when he went out in the cold, or when he was stirring around.  His wife says he would get quite pale.  Symptoms have not progressed at all.  It may come on from time to time, but it is variable.  He goes sits down and it goes away.  It is not a consistent thing.  Patient does not smoke.  He thinks the cold air causes it.  It has not gotten any worse, and occurs once every week or two weeks.  Duration of symptoms are about thirty minutes.  As soon as he relaxes, it all goes away.   Denies any rest pain whatsoever, or progression of pattern.  Current Medications (verified): 1)  Toprol Xl 25 Mg Xr24h-Tab (Metoprolol Succinate) .... Take 1 Tablet By Mouth Once A Day 2)  Nitroglycerin 0.4 Mg Subl (Nitroglycerin) .... One Tablet Under Tongue Every 5 Minutes As Needed For Chest Pain---May Repeat Times Three 3)  Aspirin Ec 325 Mg Tbec (Aspirin) .... Take One Tablet By Mouth Daily 4)  Plavix 75 Mg Tabs (Clopidogrel Bisulfate) .... Take One Tablet By Mouth Daily 5)  Metformin Hcl 500 Mg Tabs (Metformin Hcl) .... Take 1 Tablet By Mouth Two Times A Day 6)  Novolin 70/30 70-30 % Susp (Insulin Isophane & Regular) .... As Directed 7)  Simvastatin 40 Mg Tabs (Simvastatin) .... Take One Tablet By Mouth Daily At Bedtime 8)  Glipizide 10 Mg Tabs (Glipizide) .... Take 1 Tablet By Mouth Two Times A Day  Allergies (verified): No Known Drug Allergies  Vital Signs:  Patient profile:   70 year old male Height:      69 inches Weight:      198.75 pounds BMI:     29.46 Pulse rate:   70 / minute Pulse rhythm:   regular Resp:     18 per minute BP sitting:   116 / 70  (left arm) Cuff size:   large  Vitals Entered By: Vikki Ports (October 09, 2009 3:28 PM)  Physical Exam  General:  Well developed, well nourished, in  no acute distress. Head:  normocephalic and atraumatic Neck:  Neck supple, no JVD. No masses, thyromegaly or abnormal cervical nodes. Chest Wall:  no deformities or breast masses noted Lungs:  Clear bilaterally to auscultation and percussion. Heart:  Non-displaced PMI, chest non-tender; regular rate and rhythm, S1, S2 without murmurs, rubs or gallops. Carotid upstroke normal, no bruit. Normal abdominal aortic size, no bruits. Femorals normal pulses, no bruits. Pedals normal pulses. No edema, no varicosities. Abdomen:  Bowel sounds positive; abdomen soft and non-tender without masses, organomegaly, or hernias noted. No hepatosplenomegaly. Rectal:  normal external exam Pulses:  pulses normal in all 4 extremities Extremities:  No clubbing or cyanosis. Neurologic:  Alert and oriented x 3.   EKG  Procedure date:  10/09/2009  Findings:      NSR.  RBBB.  LAD.  RBBB.  Inferior MI of indeterminate age.  Impression & Recommendations:  Problem # 1:  CAD, NATIVE VESSEL (ICD-414.01) Currently with some recurrence of symptoms after stenting of both CFX and LAD.  Has residual diag ostial disease not dilated previously.  Has symptoms produced by over exertion, and yet new in onset, and history of dm.  Therefore, favor cardiac cath.  Have recommended patient come  in next week for diagnostic, but wife insistent that we wait until I return following week.  I explained risks, but she and the patient do not see this as urgent.  Will arrange for the following Monday.  Risk and benefits reviewed with patient and they consent to proceed.  Will cut insulin dose in half.   His updated medication list for this problem includes:    Toprol Xl 25 Mg Xr24h-tab (Metoprolol succinate) .Marland Kitchen... Take 1 tablet by mouth once a day    Nitroglycerin 0.4 Mg Subl (Nitroglycerin) ..... One tablet under tongue every 5 minutes as needed for chest pain---may repeat times three    Aspirin Ec 325 Mg Tbec (Aspirin) .Marland Kitchen... Take one tablet by  mouth daily    Plavix 75 Mg Tabs (Clopidogrel bisulfate) .Marland Kitchen... Take one tablet by mouth daily  Problem # 2:  HYPERCHOLESTEROLEMIA  IIA (ICD-272.0) Continue to monitor. The following medications were removed from the medication list:    Simvastatin 20 Mg Tabs (Simvastatin) .Marland Kitchen... Take 1 tablet by mouth once a day at bedtime His updated medication list for this problem includes:    Simvastatin 40 Mg Tabs (Simvastatin) .Marland Kitchen... 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 Take 1 tablet by mouth two times a day    Lantus 100 Unit/ml Soln (Insulin glargine) ..... Injct 40 units at bedtime His updated medication list for this problem includes:    Aspirin Ec 325 Mg Tbec (Aspirin) .Marland Kitchen... Take one tablet by mouth daily    Metformin Hcl 500 Mg Tabs (Metformin hcl) .Marland Kitchen... Take 1 tablet by mouth two times a day    Novolin 70/30 70-30 % Susp (Insulin isophane & regular) .Marland Kitchen... As directed    Glipizide 10 Mg Tabs (Glipizide) .Marland Kitchen... Take 1 tablet by mouth two times a day  Patient Instructions: 1)  Your physician recommends that you schedule a follow-up appointment in: 1 MONTH 2)  Your physician has requested that you have a cardiac catheterization.  Cardiac catheterization is used to diagnose and/or treat various heart conditions. Doctors may recommend this procedure for a number of different reasons. The most common reason is to evaluate chest pain. Chest pain can be a symptom of coronary artery disease (CAD), and cardiac catheterization can show whether plaque is narrowing or blocking your heart's arteries. This procedure is also used to evaluate the valves, as well as measure the blood flow and oxygen levels in different parts of your heart.  For further information please visit [email or web address]   Please follow instruction sheet, as given. 3)  PLEASE GO TO THE ER IF YOU HAVE ANY FURTHER EPISODES OF CHEST PAIN

## 2010-10-13 NOTE — Assessment & Plan Note (Signed)
Summary: f2w   Visit Type:  2 weeks follow up Primary Provider:  Pollyann Kennedy  [facility])  CC:  No cardiac complaints.  History of Present Illness: He is doing much better.  Cough has resolved.  I spoke with his primary MD, and they did a CT scan of the chest.   She told him he would need another in a year.  Denies chest pain.  Remains stable on effient, without any evidence of bleeding by history.   Problems Prior to Update: 1)  Pulmonary Nodule  (ICD-518.89) 2)  Cough  (ICD-786.2) 3)  Hypotension  (ICD-458.9) 4)  Aodm  (ICD-250.00) 5)  Aodm  (ICD-250.00) 6)  Hypercholesterolemia Iia  (ICD-272.0) 7)  Cad, Native Vessel  (ICD-414.01)  Current Medications (verified): 1)  Toprol Xl 50 Mg Xr24h-Tab (Metoprolol Succinate) .... Take 1 Tablet By Mouth Once A Day 2)  Nitroglycerin 0.4 Mg Subl (Nitroglycerin) .... One Tablet Under Tongue Every 5 Minutes As Needed For Chest Pain---May Repeat Times Three 3)  Levemir 100 Unit/ml Soln (Insulin Detemir) .... 56 Units Pm 4)  Simvastatin 40 Mg Tabs (Simvastatin) .... Take One Tablet By Mouth Daily At Bedtime 5)  Glipizide 10 Mg Tabs (Glipizide) .... Take 1 Tablet By Mouth Two Times A Day 6)  Effient 10 Mg Tabs (Prasugrel Hcl) .... Take 1 Tablet By Mouth Once A Day 7)  Aspirin 81 Mg Tbec (Aspirin) .... Take One Tablet By Mouth Daily 8)  Novolog 100 Unit/ml Soln (Insulin Aspart) .... 4 Units Before Dinner  Allergies (verified): No Known Drug Allergies  Past History:  Past Medical History: Last updated: 11/20/2008  1. Diabetes.   2. Dyslipidemia.   3. Renal insufficiency  4. Myocardial infarction on February 27/10    Past Surgical History: Last updated: 11/20/2008 Knee surgery  Family History: Last updated: 11/20/2008  Positive for CAD.  He has had 2 brothers, died from   acute MIs.   Social History: Last updated: 11/20/2008 The patient lives in [place] with his family.   He denies any tobacco use.   Vital Signs:  Patient  profile:   70 year old male Height:      69 inches Weight:      190.50 pounds BMI:     28.23 Pulse rate:   64 / minute Pulse rhythm:   regular Resp:     18 per minute BP sitting:   118 / 64  (left arm) Cuff size:   large  Vitals Entered By: Vikki Ports (August 14, 2010 10:20 AM)  Physical Exam  General:  Well developed, well nourished, in no acute distress. Head:  normocephalic and atraumatic Eyes:  PERRLA/EOM intact; conjunctiva and lids normal. Lungs:  Ronchii with some inspiratory crackles in the R base.   Heart:  PMI non displaced. Normal S1 and S2.  No murmur. Abdomen:  Bowel sounds positive; abdomen soft and non-tender without masses, organomegaly, or hernias noted. No hepatosplenomegaly. Extremities:  No clubbing or cyanosis. Neurologic:  Alert and oriented x 3.   EKG  Procedure date:  08/14/2010  Findings:      NSR.  RBBB.  Impression & Recommendations:  Problem # 1:  CAD, NATIVE VESSEL (ICD-414.01) Continues to remain stable at the present time.  Denies chest pain.  Will continue DAPT.  His updated medication list for this problem includes:    Toprol Xl 50 Mg Xr24h-tab (Metoprolol succinate) .Marland Kitchen... Take 1 tablet by mouth once a day    Nitroglycerin 0.4 Mg Subl (Nitroglycerin) .Marland KitchenMarland KitchenMarland KitchenMarland Kitchen  One tablet under tongue every 5 minutes as needed for chest pain---may repeat times three    Effient 10 Mg Tabs (Prasugrel hcl) .Marland Kitchen... Take 1 tablet by mouth once a day    Aspirin 81 Mg Tbec (Aspirin) .Marland Kitchen... Take one tablet by mouth daily  Orders: EKG w/ Interpretation (93000)  Problem # 2:  PULMONARY NODULE (ICD-518.89) See results of CXR.  I recommend CT in [place], and that has been done.  Dr. Pollyann Kennedy reveiwed that result with the family yesterday.  She told them the report would be faxed.  Will be followed in [place]  Problem # 3:  HYPERCHOLESTEROLEMIA  IIA (ICD-272.0) followed in [place].  His updated medication list for this problem includes:    Simvastatin 40 Mg Tabs  (Simvastatin) .Marland Kitchen... Take one tablet by mouth daily at bedtime  Patient Instructions: 1)  Your physician recommends that you schedule a follow-up appointment in: 3 MONTHS 2)  Your physician recommends that you continue on your current medications as directed. Please refer to the Current Medication list given to you today.

## 2010-10-13 NOTE — Assessment & Plan Note (Signed)
Summary: ROV   Visit Type:  Follow-up Primary Provider:  Mechele Collin  CC:  No complaints.  History of Present Illness: Overall pretty stable.  No chest pain.  Tolerating meds well.  Sugars are up and down.  He says he is not taking Toprol.  Not sure if he stopped Lisinopril. Main complaint is that of dry, hacking cough.    Current Medications (verified): 1)  Toprol Xl 50 Mg Xr24h-Tab (Metoprolol Succinate) .... Take 1 Tablet By Mouth Once A Day 2)  Nitroglycerin 0.4 Mg Subl (Nitroglycerin) .... One Tablet Under Tongue Every 5 Minutes As Needed For Chest Pain---May Repeat Times Three 3)  Metformin Hcl 500 Mg Tabs (Metformin Hcl) .... Take 1 Tablet By Mouth Two Times A Day 4)  Levemir 100 Unit/ml Soln (Insulin Detemir) .... 50 Units At Night and 10 Units Am. 5)  Simvastatin 40 Mg Tabs (Simvastatin) .... Take One Tablet By Mouth Daily At Bedtime 6)  Glipizide 10 Mg Tabs (Glipizide) .... Take 1 Tablet By Mouth Two Times A Day 7)  Effient 10 Mg Tabs (Prasugrel Hcl) .... Take 1 Tablet By Mouth Once A Day 8)  Aspirin 81 Mg Tbec (Aspirin) .... Take One Tablet By Mouth Daily  Allergies (verified): No Known Drug Allergies  Vital Signs:  Patient profile:   70 year old male Height:      69 inches Weight:      188.50 pounds BMI:     27.94 Pulse rate:   101 / minute Pulse rhythm:   regular Resp:     18 per minute BP sitting:   106 / 66  (left arm) Cuff size:   large  Vitals Entered By: Vikki Ports (June 30, 2010 2:59 PM)  Physical Exam  General:  Well developed, well nourished, in no acute distress. Head:  normocephalic and atraumatic Eyes:  PERRLA/EOM intact; conjunctiva and lids normal. Lungs:  Crackles R lower lung Heart:  PMI non displaced.  Normal S1 and S2.   Abdomen:  Bowel sounds positive; abdomen soft and non-tender without masses, organomegaly, or hernias noted. No hepatosplenomegaly. Pulses:  pulses normal in all 4 extremities Extremities:  No clubbing or  cyanosis. Neurologic:  Alert and oriented x 3.        CXR  Procedure date:  12/20/2009  Findings:       Clinical Data: Chest pain    CHEST - 2 VIEW    Comparison: 10/10/2009    Findings: Heart is upper limits normal in size.  There is stable   chronic bronchitic changes.  No acute opacities or effusions.   Degenerative changes in the thoracic spine and shoulders.    IMPRESSION:   Bronchitic changes, stable.    Read By:  Charlett Nose,  M.D.   Released By:  Charlett Nose,  M.D.  Impression & Recommendations:  Problem # 1:  CAD, NATIVE VESSEL (ICD-414.01) Cardiac situation stable.  No chest pain at present.  HR is clearly faster than it was, and I am suspicious that she got medications confused.  HR faster, patient has cough--instructions page from last visit said to stop Lisinopril.  She will go home, check, and call us back.  His updated medication list for this problem includes:    Toprol Xl 50 Mg Xr24h-tab (Metoprolol succinate) .Marland Kitchen... Take 1 tablet by mouth once a day    Nitroglycerin 0.4 Mg Subl (Nitroglycerin) ..... One tablet under tongue every 5 minutes as needed for chest pain---may repeat times three    Effient  10 Mg Tabs (Prasugrel hcl) .Marland Kitchen... Take 1 tablet by mouth once a day    Aspirin 81 Mg Tbec (Aspirin) .Marland Kitchen... Take one tablet by mouth daily  Orders: TLB-BMP (Basic Metabolic Panel-BMET) (80048-METABOL) TLB-CBC Platelet - w/Differential (85025-CBCD) EKG w/ Interpretation (93000)  Problem # 2:  COUGH (ICD-786.2) Has crackles in RLL, and also cough.  Prior Xray shows bronchitic changes.  Will recheck CXR and check wbc.  Has had several episodes of pneumonia.   His updated medication list for this problem includes:    Toprol Xl 50 Mg Xr24h-tab (Metoprolol succinate) .Marland Kitchen... Take 1 tablet by mouth once a day    Nitroglycerin 0.4 Mg Subl (Nitroglycerin) ..... One tablet under tongue every 5 minutes as needed for chest pain---may repeat times three    Effient 10 Mg Tabs  (Prasugrel hcl) .Marland Kitchen... Take 1 tablet by mouth once a day    Aspirin 81 Mg Tbec (Aspirin) .Marland Kitchen... Take one tablet by mouth daily  Problem # 3:  HYPERCHOLESTEROLEMIA  IIA (ICD-272.0) on LDL. His updated medication list for this problem includes:    Simvastatin 40 Mg Tabs (Simvastatin) .Marland Kitchen... Take one tablet by mouth daily at bedtime  Orders: TLB-BMP (Basic Metabolic Panel-BMET) (80048-METABOL) TLB-CBC Platelet - w/Differential (85025-CBCD)  Patient Instructions: 1)  Your physician recommends that you schedule a follow-up appointment in: 2 WEEKS 2)  Your physician recommends that you have lab work today: BMP and CBC 3)  Chest X-ray will be obtained today. 4)  Please call the office with a current list of your medications.

## 2010-11-12 ENCOUNTER — Ambulatory Visit: Payer: Self-pay | Admitting: Cardiology

## 2010-11-29 LAB — CBC
HCT: 44.5 % (ref 39.0–52.0)
HCT: 47.2 % (ref 39.0–52.0)
HCT: 49 % (ref 39.0–52.0)
Hemoglobin: 16 g/dL (ref 13.0–17.0)
Hemoglobin: 16.7 g/dL (ref 13.0–17.0)
MCHC: 34 g/dL (ref 30.0–36.0)
MCHC: 34 g/dL (ref 30.0–36.0)
MCHC: 34.2 g/dL (ref 30.0–36.0)
MCHC: 34.4 g/dL (ref 30.0–36.0)
MCV: 93.5 fL (ref 78.0–100.0)
MCV: 94.8 fL (ref 78.0–100.0)
MCV: 95.6 fL (ref 78.0–100.0)
Platelets: 139 10*3/uL — ABNORMAL LOW (ref 150–400)
Platelets: 140 10*3/uL — ABNORMAL LOW (ref 150–400)
Platelets: 150 10*3/uL (ref 150–400)
Platelets: 151 10*3/uL (ref 150–400)
RBC: 5.13 MIL/uL (ref 4.22–5.81)
RDW: 12.4 % (ref 11.5–15.5)
RDW: 12.7 % (ref 11.5–15.5)
RDW: 12.8 % (ref 11.5–15.5)
WBC: 7.1 10*3/uL (ref 4.0–10.5)
WBC: 8.1 10*3/uL (ref 4.0–10.5)
WBC: 9 10*3/uL (ref 4.0–10.5)

## 2010-11-29 LAB — BASIC METABOLIC PANEL
BUN: 19 mg/dL (ref 6–23)
BUN: 19 mg/dL (ref 6–23)
BUN: 21 mg/dL (ref 6–23)
BUN: 21 mg/dL (ref 6–23)
CO2: 23 mEq/L (ref 19–32)
Calcium: 8.8 mg/dL (ref 8.4–10.5)
Calcium: 8.9 mg/dL (ref 8.4–10.5)
Chloride: 102 mEq/L (ref 96–112)
Chloride: 104 mEq/L (ref 96–112)
Chloride: 98 mEq/L (ref 96–112)
Chloride: 99 mEq/L (ref 96–112)
Creatinine, Ser: 1.36 mg/dL (ref 0.4–1.5)
Creatinine, Ser: 1.38 mg/dL (ref 0.4–1.5)
Creatinine, Ser: 1.43 mg/dL (ref 0.4–1.5)
GFR calc Af Amer: 60 mL/min — ABNORMAL LOW (ref >60)
GFR calc Af Amer: >60 mL/min (ref >60)
GFR calc Af Amer: >60 mL/min (ref >60)
GFR calc non Af Amer: 52 mL/min — ABNORMAL LOW (ref >60)
GFR calc non Af Amer: 53 mL/min — ABNORMAL LOW (ref >60)
Glucose, Bld: 261 mg/dL — ABNORMAL HIGH (ref 70–99)
Glucose, Bld: 290 mg/dL — ABNORMAL HIGH (ref 70–99)
Potassium: 3.9 mEq/L (ref 3.5–5.1)
Potassium: 4.5 mEq/L (ref 3.5–5.1)
Potassium: 4.6 mEq/L (ref 3.5–5.1)
Sodium: 131 mEq/L — ABNORMAL LOW (ref 135–145)
Sodium: 135 mEq/L (ref 135–145)

## 2010-11-29 LAB — TSH: TSH: 1.687 u[IU]/mL (ref 0.350–4.500)

## 2010-11-29 LAB — GLUCOSE, CAPILLARY
Glucose-Capillary: 206 mg/dL — ABNORMAL HIGH (ref 70–99)
Glucose-Capillary: 247 mg/dL — ABNORMAL HIGH (ref 70–99)
Glucose-Capillary: 262 mg/dL — ABNORMAL HIGH (ref 70–99)
Glucose-Capillary: 267 mg/dL — ABNORMAL HIGH (ref 70–99)
Glucose-Capillary: 270 mg/dL — ABNORMAL HIGH (ref 70–99)
Glucose-Capillary: 278 mg/dL — ABNORMAL HIGH (ref 70–99)
Glucose-Capillary: 321 mg/dL — ABNORMAL HIGH (ref 70–99)
Glucose-Capillary: 328 mg/dL — ABNORMAL HIGH (ref 70–99)
Glucose-Capillary: 361 mg/dL — ABNORMAL HIGH (ref 70–99)

## 2010-11-29 LAB — CK TOTAL AND CKMB (NOT AT ARMC)
CK, MB: 5.5 ng/mL — ABNORMAL HIGH (ref 0.3–4.0)
Relative Index: 0.7 (ref 0.0–2.5)
Total CK: 746 U/L — ABNORMAL HIGH (ref 7–232)

## 2010-11-29 LAB — DIFFERENTIAL
Basophils Absolute: 0.1 10*3/uL (ref 0.0–0.1)
Basophils Relative: 1 % (ref 0–1)
Eosinophils Absolute: 0.2 10*3/uL (ref 0.0–0.7)
Eosinophils Relative: 3 % (ref 0–5)
Lymphocytes Relative: 27 % (ref 12–46)
Lymphs Abs: 1.9 10*3/uL (ref 0.7–4.0)
Monocytes Absolute: 0.8 10*3/uL (ref 0.1–1.0)
Monocytes Relative: 11 % (ref 3–12)
Neutro Abs: 4.1 10*3/uL (ref 1.7–7.7)
Neutrophils Relative %: 58 % (ref 43–77)

## 2010-11-29 LAB — POCT I-STAT, CHEM 8
BUN: 21 mg/dL (ref 6–23)
Calcium, Ion: 1.18 mmol/L (ref 1.12–1.32)
Chloride: 103 mEq/L (ref 96–112)
Creatinine, Ser: 0.8 mg/dL (ref 0.4–1.5)
Glucose, Bld: 386 mg/dL — ABNORMAL HIGH (ref 70–99)
HCT: 49 % (ref 39.0–52.0)
Hemoglobin: 16.7 g/dL (ref 13.0–17.0)
Potassium: 4.4 mEq/L (ref 3.5–5.1)
Sodium: 135 mEq/L (ref 135–145)
TCO2: 25 mmol/L (ref 0–100)

## 2010-11-29 LAB — HEPARIN LEVEL (UNFRACTIONATED)
Heparin Unfractionated: 0.37 IU/mL (ref 0.30–0.70)
Heparin Unfractionated: 0.44 IU/mL (ref 0.30–0.70)
Heparin Unfractionated: 0.55 IU/mL (ref 0.30–0.70)
Heparin Unfractionated: <0.1 IU/mL — ABNORMAL LOW (ref 0.30–0.70)

## 2010-11-29 LAB — CARDIAC PANEL(CRET KIN+CKTOT+MB+TROPI)
CK, MB: 3.6 ng/mL (ref 0.3–4.0)
CK, MB: 3.8 ng/mL (ref 0.3–4.0)
Relative Index: INVALID (ref 0.0–2.5)
Total CK: 95 U/L (ref 7–232)
Troponin I: 0.21 ng/mL — ABNORMAL HIGH (ref 0.00–0.06)
Troponin I: 0.29 ng/mL — ABNORMAL HIGH (ref 0.00–0.06)

## 2010-11-29 LAB — APTT: aPTT: 30 seconds (ref 24–37)

## 2010-11-29 LAB — TROPONIN I: Troponin I: 0.07 ng/mL — ABNORMAL HIGH (ref 0.00–0.06)

## 2010-11-29 LAB — LIPID PANEL
Cholesterol: 124 mg/dL (ref 0–200)
LDL Cholesterol: 54 mg/dL (ref 0–99)
Total CHOL/HDL Ratio: 3.6 RATIO
Triglycerides: 182 mg/dL — ABNORMAL HIGH (ref <150)
VLDL: 36 mg/dL (ref 0–40)

## 2010-11-29 LAB — POCT CARDIAC MARKERS
CKMB, poc: 2.6 ng/mL (ref 1.0–8.0)
Myoglobin, poc: 94.2 ng/mL (ref 12–200)
Troponin i, poc: <0.05 ng/mL (ref 0.00–0.09)

## 2010-11-29 LAB — MRSA PCR SCREENING: MRSA by PCR: NEGATIVE

## 2010-12-02 LAB — CBC
HCT: 42.2 % (ref 39.0–52.0)
HCT: 43.1 % (ref 39.0–52.0)
HCT: 44.8 % (ref 39.0–52.0)
Hemoglobin: 14.4 g/dL (ref 13.0–17.0)
Hemoglobin: 14.8 g/dL (ref 13.0–17.0)
Hemoglobin: 15.2 g/dL (ref 13.0–17.0)
MCHC: 34.2 g/dL (ref 30.0–36.0)
MCHC: 34.3 g/dL (ref 30.0–36.0)
MCHC: 33.9 g/dL (ref 30.0–36.0)
MCV: 94.9 fL (ref 78.0–100.0)
MCV: 96.2 fL (ref 78.0–100.0)
Platelets: 137 10*3/uL — ABNORMAL LOW (ref 150–400)
Platelets: 151 10*3/uL (ref 150–400)
RBC: 4.54 MIL/uL (ref 4.22–5.81)
RBC: 4.84 MIL/uL (ref 4.22–5.81)
RDW: 12.2 % (ref 11.5–15.5)
RDW: 12.5 % (ref 11.5–15.5)
RDW: 12.4 % (ref 11.5–15.5)
WBC: 7.6 10*3/uL (ref 4.0–10.5)

## 2010-12-02 LAB — BASIC METABOLIC PANEL
BUN: 17 mg/dL (ref 6–23)
CO2: 25 mEq/L (ref 19–32)
CO2: 24 mEq/L (ref 19–32)
Calcium: 8.6 mg/dL (ref 8.4–10.5)
Glucose, Bld: 111 mg/dL — ABNORMAL HIGH (ref 70–99)
Glucose, Bld: 258 mg/dL — ABNORMAL HIGH (ref 70–99)
Potassium: 3.8 mEq/L (ref 3.5–5.1)
Potassium: 3.9 mEq/L (ref 3.5–5.1)
Sodium: 140 mEq/L (ref 135–145)
Sodium: 133 mEq/L — ABNORMAL LOW (ref 135–145)

## 2010-12-02 LAB — COMPREHENSIVE METABOLIC PANEL
ALT: 18 U/L (ref 0–53)
AST: 44 U/L — ABNORMAL HIGH (ref 0–37)
Albumin: 3.5 g/dL (ref 3.5–5.2)
Alkaline Phosphatase: 58 U/L (ref 39–117)
Chloride: 103 mEq/L (ref 96–112)
GFR calc Af Amer: >60 mL/min (ref >60)
GFR calc non Af Amer: 58 mL/min — ABNORMAL LOW (ref >60)
Potassium: 3.9 mEq/L (ref 3.5–5.1)
Total Bilirubin: 0.6 mg/dL (ref 0.3–1.2)

## 2010-12-02 LAB — CK TOTAL AND CKMB (NOT AT ARMC)
CK, MB: 25.1 ng/mL (ref 0.3–4.0)
Total CK: 464 U/L — ABNORMAL HIGH (ref 7–232)

## 2010-12-02 LAB — GLUCOSE, CAPILLARY
Glucose-Capillary: 124 mg/dL — ABNORMAL HIGH (ref 70–99)
Glucose-Capillary: 189 mg/dL — ABNORMAL HIGH (ref 70–99)
Glucose-Capillary: 230 mg/dL — ABNORMAL HIGH (ref 70–99)
Glucose-Capillary: 233 mg/dL — ABNORMAL HIGH (ref 70–99)
Glucose-Capillary: 333 mg/dL — ABNORMAL HIGH (ref 70–99)

## 2010-12-02 LAB — POCT CARDIAC MARKERS: Myoglobin, poc: 361 ng/mL (ref 12–200)

## 2010-12-02 LAB — URINALYSIS, ROUTINE W REFLEX MICROSCOPIC
Glucose, UA: >1000 mg/dL — AB
Ketones, ur: NEGATIVE mg/dL
Leukocytes, UA: NEGATIVE
Nitrite: NEGATIVE
Protein, ur: NEGATIVE mg/dL
pH: 5.5 (ref 5.0–8.0)

## 2010-12-02 LAB — HEMOGLOBIN A1C
Hgb A1c MFr Bld: 10.4 % — ABNORMAL HIGH (ref 4.6–6.1)
Mean Plasma Glucose: 252 mg/dL

## 2010-12-02 LAB — LIPASE, BLOOD: Lipase: 25 U/L (ref 11–59)

## 2010-12-02 LAB — DIFFERENTIAL
Lymphocytes Relative: 24 % (ref 12–46)
Lymphs Abs: 1.8 10*3/uL (ref 0.7–4.0)
Monocytes Relative: 12 % (ref 3–12)
Neutro Abs: 4.6 10*3/uL (ref 1.7–7.7)
Neutrophils Relative %: 60 % (ref 43–77)

## 2010-12-02 LAB — PROTIME-INR: INR: 0.98 (ref 0.00–1.49)

## 2010-12-02 LAB — CARDIAC PANEL(CRET KIN+CKTOT+MB+TROPI)
CK, MB: 16.1 ng/mL (ref 0.3–4.0)
Relative Index: 6.5 — ABNORMAL HIGH (ref 0.0–2.5)
Total CK: 249 U/L — ABNORMAL HIGH (ref 7–232)
Total CK: 398 U/L — ABNORMAL HIGH (ref 7–232)
Troponin I: 5.84 ng/mL (ref 0.00–0.06)
Troponin I: 8.74 ng/mL (ref 0.00–0.06)

## 2010-12-02 LAB — LIPID PANEL
Cholesterol: 117 mg/dL (ref 0–200)
HDL: 31 mg/dL — ABNORMAL LOW (ref >39)

## 2010-12-02 LAB — TROPONIN I: Troponin I: 2.34 ng/mL (ref 0.00–0.06)

## 2010-12-02 LAB — MRSA PCR SCREENING: MRSA by PCR: NEGATIVE

## 2010-12-02 LAB — URINE MICROSCOPIC-ADD ON

## 2010-12-07 ENCOUNTER — Encounter: Payer: Self-pay | Admitting: *Deleted

## 2010-12-16 ENCOUNTER — Encounter: Payer: Self-pay | Admitting: Cardiology

## 2010-12-16 ENCOUNTER — Ambulatory Visit (INDEPENDENT_AMBULATORY_CARE_PROVIDER_SITE_OTHER): Payer: Medicare Other | Admitting: Cardiology

## 2010-12-16 DIAGNOSIS — I2510 Atherosclerotic heart disease of native coronary artery without angina pectoris: Secondary | ICD-10-CM

## 2010-12-16 DIAGNOSIS — E7800 Pure hypercholesterolemia, unspecified: Secondary | ICD-10-CM

## 2010-12-16 NOTE — Assessment & Plan Note (Signed)
See my note above.  We talked for a long time about DAPT, risk of bleed, benefit after one year, etc.  He will come off Efient at the present time.  We reviewed in detail.  Followup in one year or sooner should problems arise.

## 2010-12-16 NOTE — Progress Notes (Signed)
HPI:  Patient is in for followup.  He is doing well.  He says his CT is scheduled for next month.  Denies angina.  We reviewed his data.  Now out about a year from the time of cutting balloon in the diagonal.  Has DES in two vessels, both second generation stents.  He wants to stop Effient although he denies current bleeding.   Current Outpatient Prescriptions  Medication Sig Dispense Refill  . aspirin 81 MG tablet Take 81 mg by mouth daily.        Marland Kitchen glipiZIDE (GLUCOTROL) 10 MG tablet Take 10 mg by mouth 2 (two) times daily before a meal.        . insulin detemir (LEVEMIR) 100 UNIT/ML injection Inject 60 Units into the skin at bedtime.       . metoprolol (TOPROL-XL) 50 MG 24 hr tablet Take 50 mg by mouth daily.        . nitroGLYCERIN (NITROSTAT) 0.4 MG SL tablet Place 0.4 mg under the tongue every 5 (five) minutes as needed.        . prasugrel (EFFIENT) 10 MG TABS Take 10 mg by mouth daily.        . simvastatin (ZOCOR) 40 MG tablet Take 40 mg by mouth at bedtime.        . insulin aspart (NOVOLOG) 100 UNIT/ML injection Inject 4 Units into the skin. Before dinner,         No Known Allergies  Past Medical History  Diagnosis Date  . Diabetes mellitus   . Dyslipidemia   . Renal insufficiency   . MI (myocardial infarction) feb 27/10    Past Surgical History  Procedure Date  . Knee surgery     Family History  Problem Relation Age of Onset  . Heart attack Brother     x2 brothers    History   Social History  . Marital Status: Married    Spouse Name: N/A    Number of Children: N/A  . Years of Education: N/A   Occupational History  . Not on file.   Social History Main Topics  . Smoking status: Never Smoker   . Smokeless tobacco: Not on file  . Alcohol Use: No  . Drug Use: No  . Sexually Active: Not on file   Other Topics Concern  . Not on file   Social History Narrative   The patient lives in [place] with the family. He denies any tobacco use.     ROS: Please see the HPI.  All other systems reviewed and negative.  PHYSICAL EXAM:  BP 122/60  Pulse 61  Ht 5\' 9"  (1.753 m)  Wt 188 lb (85.276 kg)  BMI 27.76 kg/m2  General: Well developed, well nourished, in no acute distress. Head:  Normocephalic and atraumatic. Neck: no JVD Lungs: Prolonged expiration with decrease in BS.   Heart: Normal S1 and S2.  No murmur, rubs or gallops.  Abdomen:  Normal bowel sounds; soft; non tender; no organomegaly Pulses: Pulses normal in all 4 extremities. Extremities: No clubbing or cyanosis. No edema. Neurologic: Alert and oriented x 3.  EKG:  NSR with RBBB and LAFB.  Old inferior Q waves.    ASSESSMENT AND PLAN:

## 2010-12-16 NOTE — Patient Instructions (Addendum)
Your physician wants you to follow-up in: 6 MONTHS. You will receive a reminder letter in the mail two months in advance. If you don't receive a letter, please call our office to schedule the follow-up appointment.  Your physician has recommended you make the following change in your medication: STOP Effient

## 2010-12-16 NOTE — Assessment & Plan Note (Signed)
Managed by primary care. 

## 2010-12-24 LAB — COMPREHENSIVE METABOLIC PANEL
ALT: 27 U/L (ref 0–53)
AST: 37 U/L (ref 0–37)
AST: 52 U/L — ABNORMAL HIGH (ref 0–37)
Albumin: 3.1 g/dL — ABNORMAL LOW (ref 3.5–5.2)
Albumin: 3.3 g/dL — ABNORMAL LOW (ref 3.5–5.2)
Alkaline Phosphatase: 49 U/L (ref 39–117)
BUN: 22 mg/dL (ref 6–23)
CO2: 29 mEq/L (ref 19–32)
Calcium: 9 mg/dL (ref 8.4–10.5)
Chloride: 101 mEq/L (ref 96–112)
GFR calc Af Amer: >60 mL/min (ref >60)
Potassium: 4.5 mEq/L (ref 3.5–5.1)
Sodium: 135 mEq/L (ref 135–145)
Total Bilirubin: 0.8 mg/dL (ref 0.3–1.2)
Total Protein: 6.8 g/dL (ref 6.0–8.3)

## 2010-12-24 LAB — GLUCOSE, CAPILLARY
Glucose-Capillary: 228 mg/dL — ABNORMAL HIGH (ref 70–99)
Glucose-Capillary: 323 mg/dL — ABNORMAL HIGH (ref 70–99)
Glucose-Capillary: 158 mg/dL — ABNORMAL HIGH (ref 70–99)
Glucose-Capillary: 219 mg/dL — ABNORMAL HIGH (ref 70–99)
Glucose-Capillary: 252 mg/dL — ABNORMAL HIGH (ref 70–99)

## 2010-12-24 LAB — CBC
HCT: 40.9 % (ref 39.0–52.0)
Hemoglobin: 14.6 g/dL (ref 13.0–17.0)
Hemoglobin: 15.9 g/dL (ref 13.0–17.0)
Hemoglobin: 14 g/dL (ref 13.0–17.0)
MCHC: 34.2 g/dL (ref 30.0–36.0)
MCV: 94.4 fL (ref 78.0–100.0)
Platelets: 131 10*3/uL — ABNORMAL LOW (ref 150–400)
RBC: 4.95 MIL/uL (ref 4.22–5.81)
RBC: 4.33 MIL/uL (ref 4.22–5.81)
RDW: 13.1 % (ref 11.5–15.5)
RDW: 13.2 % (ref 11.5–15.5)
WBC: 6.9 10*3/uL (ref 4.0–10.5)

## 2010-12-24 LAB — BASIC METABOLIC PANEL
CO2: 27 mEq/L (ref 19–32)
Calcium: 8.6 mg/dL (ref 8.4–10.5)
Calcium: 9 mg/dL (ref 8.4–10.5)
Chloride: 103 mEq/L (ref 96–112)
GFR calc Af Amer: >60 mL/min (ref >60)
GFR calc non Af Amer: 55 mL/min — ABNORMAL LOW (ref >60)
Glucose, Bld: 190 mg/dL — ABNORMAL HIGH (ref 70–99)
Glucose, Bld: 211 mg/dL — ABNORMAL HIGH (ref 70–99)
Potassium: 3.9 mEq/L (ref 3.5–5.1)
Sodium: 134 mEq/L — ABNORMAL LOW (ref 135–145)
Sodium: 136 mEq/L (ref 135–145)

## 2010-12-24 LAB — HEPATIC FUNCTION PANEL
Albumin: 3 g/dL — ABNORMAL LOW (ref 3.5–5.2)
Alkaline Phosphatase: 48 U/L (ref 39–117)
Bilirubin, Direct: <0.1 mg/dL (ref 0.0–0.3)
Total Bilirubin: 0.5 mg/dL (ref 0.3–1.2)

## 2010-12-29 LAB — GLUCOSE, CAPILLARY
Glucose-Capillary: 180 mg/dL — ABNORMAL HIGH (ref 70–99)
Glucose-Capillary: 181 mg/dL — ABNORMAL HIGH (ref 70–99)
Glucose-Capillary: 196 mg/dL — ABNORMAL HIGH (ref 70–99)
Glucose-Capillary: 240 mg/dL — ABNORMAL HIGH (ref 70–99)

## 2010-12-29 LAB — CARDIAC PANEL(CRET KIN+CKTOT+MB+TROPI)
CK, MB: 342.2 ng/mL — ABNORMAL HIGH (ref 0.3–4.0)
Relative Index: 9.6 — ABNORMAL HIGH (ref 0.0–2.5)
Troponin I: >100 ng/mL (ref 0.00–0.06)

## 2010-12-29 LAB — LIPID PANEL
LDL Cholesterol: 53 mg/dL (ref 0–99)
Total CHOL/HDL Ratio: 3.5 RATIO
Triglycerides: 143 mg/dL (ref <150)
VLDL: 29 mg/dL (ref 0–40)

## 2010-12-29 LAB — CBC
HCT: 41.1 % (ref 39.0–52.0)
Platelets: 181 10*3/uL (ref 150–400)
RDW: 13 % (ref 11.5–15.5)

## 2010-12-29 LAB — COMPREHENSIVE METABOLIC PANEL
ALT: 53 U/L (ref 0–53)
AST: 254 U/L — ABNORMAL HIGH (ref 0–37)
Calcium: 8.7 mg/dL (ref 8.4–10.5)
Creatinine, Ser: 1.34 mg/dL (ref 0.4–1.5)
GFR calc non Af Amer: 53 mL/min — ABNORMAL LOW (ref >60)
Potassium: 4.1 mEq/L (ref 3.5–5.1)
Sodium: 135 mEq/L (ref 135–145)
Total Bilirubin: 0.6 mg/dL (ref 0.3–1.2)

## 2010-12-29 LAB — APTT: aPTT: 34 seconds (ref 24–37)

## 2010-12-29 LAB — PROTIME-INR: Prothrombin Time: 14.2 seconds (ref 11.6–15.2)

## 2011-01-09 ENCOUNTER — Other Ambulatory Visit: Payer: Self-pay | Admitting: Cardiology

## 2011-01-09 DIAGNOSIS — I2510 Atherosclerotic heart disease of native coronary artery without angina pectoris: Secondary | ICD-10-CM

## 2011-01-26 NOTE — Cardiovascular Report (Signed)
NAMEJOHNNY, Morgan NO.:  [id number]   MEDICAL RECORD NO.:  [id number]          PATIENT TYPE:  INP   LOCATION:  2502                         FACILITY:  [facility]   PHYSICIAN:  Arturo Morton. Riley Kill, MD, FACCDATE OF BIRTH:  31-Dec-1940   DATE OF PROCEDURE:  12/06/2008  DATE OF DISCHARGE:                            CARDIAC CATHETERIZATION   INDICATIONS:  Mr. Trentman is well known to me.  Approximately 1 month  ago, he presented with an acute myocardial infarction and had a total  occlusion of his circumflex.  This was successfully stented using a drug-  eluting stent.  The patient is an insulin-dependent diabetic.  We had  been following him.  He has known disease with a high-grade obstruction  in the mid left anterior descending artery with a moderately high-grade  obstruction in the diagonal as well just above the LAD area.  Dr. Excell Seltzer  and I and Dr. Juanda Chance had originally reviewed his films, and the  recommendation at that time was eventually to probably open the LAD  and/or diagonal.  Electively, the patient has had multiple weeks of  recovery now.  Followup ejection fraction does introduce some moderate  reduction in LV function.  We have discussed the situation with him in  detail twice in the office, and our general recommendation has been to  come in for restudy with possible revascularization.  The patient  fortunately has been asymptomatic.  Unfortunately, we did not feel that  a radionuclide imaging study would be to helpful given the circumflex  defect.  His BUN and creatinine were stable, and he was brought in today  for further evaluation.   PROCEDURE:  Percutaneous stenting of the left anterior descending artery  using a drug-eluting stent.   DESCRIPTION OF PROCEDURE:  The patient was brought to the  catheterization laboratory and prepped and draped in usual fashion.  Through an anterior puncture, the femoral artery was entered and we used  a 5-French  sheath.  Diagnostic views were then obtained.  I then  reviewed the films with Dr. Clifton James and I had previously reviewed the  old films with Dr. Juanda Chance.  There was a high-grade lesion of the LAD,  and the diagonal had a lesion of approximately 80% as well.  It looks  slightly less severe than on the previous study and it had a favorable  angle of takeoff from the LAD itself.  After careful discussion, and  consideration of thought, we elected to recommend percutaneous stenting  of the LAD.  I had extensively discussed all the options with the  patient's family and the patient prior to the intervention.  The patient  was then given bivalirudin and the sheath was upgraded to a 7-French  sheath.  We used a 7-French guide in order to have options with regard  to do stenting and dilatation.  We elected to protect the diagonal using  a traverse wire.  A [place] wire was placed down into the LAD.  The  strategy for this lesion was to dilate the main LAD, and only treat the  diagonal for  reduction in flow.  Because of his insulin-dependent  diabetes, it was felt that the likelihood of restenosis would be high at  the ostium of the diagonal, and there was good TIMI 3 flow.  The LAD was  dilated using a 2.5 x 12 apex balloon.  There was no reduction in flow  in the diagonal, and no real change in the appearance of the vessel with  this.  As a result, we elected to pullback the wire, and the LAD was  then stented using a 15 x 2.75 XIENCE drug-eluting platform.  This was  deployed between 13 and 14 atmospheres, and subsequently postdilated  using a 3.25 x 12 [place] Voyager balloon with several post dilatations to  make sure that the entire stent was dilated.  There was marked  improvement the LAD, and no deterioration in the appearance of the  diagonal.  Diagonal flow remained at TIMI 3 flow through the stent and  appeared no worse.  As a result, we elected to manage this  conservatively.  All catheters  were subsequently removed, and a 7-French  sheath sewn into place.  He was taken to the 2500 unit for convalescent  care.   ANGIOGRAPHIC DATA:  1. The left main is free of critical disease.  2. The circumflex is a dominant vessel.  The circumflex had been      previously stented with a drug-eluting stent in the large OM and      this appear to be widely patent without significant obstruction.      The AV circumflex coursing around posteriorly also remains widely      patent.  3. The LAD courses to the apex.  There was an eccentric 80% stenosis      at the takeoff of the moderate-sized diagonal.  Following stenting,      the LAD was reduced to 0% residual luminal narrowing with an      excellent angiographic result with no evidence of obvious edge      tear.  The diagonal itself remains patent, with residual 80%      stenosis, but TIMI 3 flow.  Based upon our preprocedural treatment      strategy, this was felt to be satisfactory.   CONCLUSION:  Successful percutaneous stenting of the left anterior  descending artery with initial wire protection of an ostial diagonal  stenosis, which remained patent following stenting.  The angle of  incidence of the diagonal was favorable and there was not large plaque  burden along the margins of the diagonal and is therefore stable.   DISPOSITION:  The patient will remain on aspirin and Plavix.  He will  have follow up with me in the office.  As an addendum, 300 mg of oral  clopidogrel was given prior to the procedure.  The patient had been on  chronic Plavix.      Arturo Morton. Riley Kill, MD, [facility]  Electronically Signed     TDS/MEDQ  D:  12/06/2008  T:  12/07/2008  Job:  [id number]

## 2011-01-26 NOTE — H&P (Signed)
Lucas Morgan, Lucas Morgan NO.:  [id number]   MEDICAL RECORD NO.:  [id number]          PATIENT TYPE:  INP   LOCATION:  2904                         FACILITY:  [facility]   PHYSICIAN:  Arturo Morton. Riley Kill, MD, FACCDATE OF BIRTH:  1941/03/27   DATE OF ADMISSION:  11/09/2008  DATE OF DISCHARGE:                              HISTORY & PHYSICAL   Lucas Morgan is a 70 year old Caucasian gentleman taken urgently to the  cath lab for a code STEMI.  Lucas Morgan was in his usual state of health  until today, he was out this afternoon about 1:30 chopping wood, when he  had a sudden onset of nausea, vomiting, diaphoresis, and chest  discomfort.  EMS was called.  The patient received nitroglycerin  sublingual x1 en route without relief.  On arrival to [facility],  EKG showed inferior infarction.  The patient was loaded with 600 of  Plavix, 4000 units of heparin, started on nitroglycerin drip, Zofran for  his nausea, and morphine 2 mg IV given.  Blood pressure stable at 140/83  and heart rate in the 90s.  The patient is currently rating his pain as  4 on a scale of 1-10.  The patient was previously seen by Dr. Earna Coder at  [place], [place] for questionable sinus tach.  Apparently, he had an  echocardiogram, but reportedly refused further workup including  catheterization at that time.  Details are somewhat sketchy.   PAST MEDICAL HISTORY:  1. Diabetes.  2. Dyslipidemia.  3. Remote knee surgery.   SOCIAL HISTORY:  The patient lives in [place] with his family.  He denies any tobacco use.   FAMILY HISTORY:  Positive for CAD.  He has had 2 brothers, died from  acute MIs.   REVIEW OF SYSTEMS:  Positive for diaphoresis, chest pain, shortness of  breath, nausea, and vomiting.  All other systems reviewed and negative.   ALLERGIES:  No known drug allergies.   CURRENT MEDICATIONS:  Blood pressure pill, although the patient states  he took an aspirin today.   PHYSICAL  EXAMINATION:  VITAL SIGNS:  Heart rate is 90, blood pressure  143/87, and respirations 22.  The patient is currently on 100% non-  rebreather, rating his pain a 4 on a scale of 1-10.  HEENT:  Unremarkable.  NECK:  Supple without bruits or JVD.  Note, assessment done in a supine  position.  CARDIOVASCULAR:  S1 and S2, slightly tachy.  LUNGS:  Clear to auscultation bilaterally.  SKIN:  Warm, mildly clammy.  ABDOMEN:  Soft, nontender, and positive bowel sounds.  LOWER EXTREMITIES:  Without clubbing, cyanosis, or edema.  NEUROLOGIC:  He is alert and oriented x3.   Chest x-ray is pending.  EKG obtained here is pending.  EKG obtained at  [facility] showing an inferior MI.  Lab work is pending.   DIAGNOSIS:  Code ST-segment elevation myocardial infarction.   The patient is being taken urgently to the cath lab for intervention by  Dr. Shawnie Pons.  Risks and benefits of catheterization have been  discussed with the patient.  The patient verbalizes  understanding and  agrees to proceed with cardiac catheterization emergently.      Dorian Pod, ACNP      Arturo Morton. Riley Kill, MD, [facility]  Electronically Signed    MB/MEDQ  D:  11/09/2008  T:  11/10/2008  Job:  [id number]

## 2011-01-26 NOTE — Assessment & Plan Note (Signed)
[facility]                            CARDIOLOGY OFFICE NOTE   Lucas Morgan, Lucas Morgan                         MRN:          [id number]  DATE:11/21/2008                            DOB:          01-04-1941    HISTORY OF PRESENT ILLNESS:  Lucas Morgan is in for a followup visit.  In  general, he is doing really quite well.  He has been walking up to a  mile a day without any significant chest discomfort.  The patient came  in with a circumflex infarct, had a drug-eluting stent placed.  He is an  insulin-dependent diabetic.  The patient had a significant lesion of the  left anterior descending artery, being about 75-80% as well as an 80-90%  lesion of the diagonal.  Dr. Excell Seltzer and I reviewed these films and our  leaning was towards possibility of percutaneous intervention.  He denies  any ongoing progressive chest pain.  He has been not short of breath.  We have talked about the various options, and I reviewed the various  options with the patient in detail today.  We have talked about going  back in and doing percutaneous intervention of the left anterior  descending artery and involving the diagonal.  I have also talked to him  about the possibility of revascularization surgery, but he is on  clopidogrel.  I have planned to review the films with the surgeons, and  our current plan is to bring him back in for restudy within another 2  weeks with hopefully maximum recovery of his LV function.   MEDICATIONS:  Include:  1. Lantus insulin.  2. Plavix 75 mg daily.  3. Toprol-XL 25 mg daily.  4. Enteric-coated aspirin 325 mg daily.  5. Simvastatin 20 mg daily.  6. Amaryl 4 mg b.i.d.   PHYSICAL EXAMINATION:  GENERAL:  He is alert and oriented, in no  distress.  VITAL SIGNS:  Blood pressure is 104/70, pulse is 69.  LUNGS:  The lung fields are clear.  CARDIAC:  Rhythm is regular.  EXTREMITIES:  Reveal no edema.  The groin is healing well with a small  amount  of ecchymosis.   The electrocardiogram demonstrates normal sinus rhythm.  There is right  bundle-branch block with possible left atrial enlargement.  There is T-  wave inversion in the inferior and somewhat in the lateral leads in V5  and V6.   IMPRESSION:  1. Recent inferolateral wall myocardial infarction treated with      stenting of the circumflex marginal.  2. Fairly high-grade residual disease of the left anterior descending      (coronary artery) and diagonal.   PLAN:  1. Return to clinic in 10 days.  2. At that time, plans will made for repeat catheterization on      Tuesday.  3. He is to continue to walk between half a mile and a mile a day.  4. We will review the films in the interim.  5. A 2-D echocardiogram will be obtained to reassess his left      ventricle recovery.  6. BMET, CBC, lipid and liver profile will be obtained today to      optimize medical therapy.     Arturo Morton. Riley Kill, MD, [facility]  Electronically Signed    TDS/MedQ  DD: 11/21/2008  DT: 11/21/2008  Job #: [id number]   cc:   Mariana Kaufman, MD in [place]

## 2011-01-26 NOTE — H&P (Signed)
Lucas Morgan, Lucas Morgan NO.:  [id number]   MEDICAL RECORD NO.:  [id number]          PATIENT TYPE:  OIB   LOCATION:  2899                         FACILITY:  [facility]   PHYSICIAN:  Arturo Morton. Riley Kill, MD, FACCDATE OF BIRTH:  28-Apr-1941   DATE OF ADMISSION:  12/06/2008  DATE OF DISCHARGE:                              HISTORY & PHYSICAL   CHIEF COMPLAINT:  I have heart disease.   HISTORY OF PRESENT ILLNESS:  Lucas Morgan is a 70 year old gentleman from  [place].  He presented to the [facility] Emergency Room with an acute  myocardial infarction.  There was some ST elevation inferiorly.  The  patient has insulin-dependent diabetes.  He was brought to the  catheterization laboratory emergently.  With this, ventriculography was  done in the RAO projection with inferior hypokinesis.  He had a diabetic-  appearing coronaries with a dominant circumflex.  There was a stump  occlusion of the first marginal.  He had residual high-grade disease of  the left anterior descending artery with a focal lesion of the LAD, and  a modestly high-grade disease of the diagonal.  We have treated  conservatively, and overall he has gotten along well since discharge  from the hospital.  He has not had recurrent symptoms.  He does,  however, have residual high-grade LAD disease.  As a result, he is  brought back in for repeat hospitalization and repeat catheterization.   PAST MEDICAL HISTORY:  The patient has:  1. Insulin-dependent diabetes.  2. Dyslipidemia.  3. Remote knee surgery.  4. 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   SOCIAL HISTORY:  The patient lives in [place] with his family  and he does not use tobacco.   FAMILY HISTORY:  He had 2 brothers who died from acute myocardial  infarction.   REVIEW OF SYSTEMS:  The patient has been getting along reasonably well  without significant chest pain.  His shortness of breath is improved.  He has had no  change in his stools.  The color of stools had been  normal.  Complete 10-point review of systems is negative.   ALLERGIES:  No known drug allergies.   MEDICATIONS:  1. Lantus insulin 25 units nightly.  2. Plavix 75 mg daily.  3. Toprol-XL 25 daily.  4. Enteric-coated aspirin 325 daily.  5. Simvastatin 20 daily.  6. Enbrel 4 b.i.d.   PHYSICAL EXAMINATION:  GENERAL:  He is alert and oriented, in no  distress.  VITAL SIGNS:  Blood pressure is 114/76, pulse 68.  LUNGS:  Lung fields clear.  CARDIAC:  Rhythm is regular.  There is an S4 gallop.  ABDOMEN:  Soft without obvious hepatosplenomegaly.  EXTREMITIES:  Femoral pulses are intact.  Distal pulses are intact.  NEUROLOGIC:  Nonfocal.   The electrocardiogram demonstrates normal sinus rhythm.  There is a low  atrial location possibly.  There is a right bundle branch block which is  incomplete.  There is T-wave inversion noted in the inferior leads,  evolutionary from his infarction.   IMPRESSION:  1. Prior acute lateral wall infarction.  2. High-grade residual left anterior descending artery diagonal      disease.   RECOMMENDATIONS:  We will recommend restudy at the present time.  Consideration might be given to percutaneous intervention of the LAD  with possible deferral of the diagonal.  Some of the findings will  depend upon the restudy.  Risks, benefits, and alternatives have been  discussed with the patient and his family in 2 office visits.      Arturo Morton. Riley Kill, MD, [facility]  Electronically Signed     TDS/MEDQ  D:  12/06/2008  T:  12/06/2008  Job:  [id number]

## 2011-01-26 NOTE — Discharge Summary (Signed)
NAMEKNOWLEDGE, ESCANDON NO.:  [id number]   MEDICAL RECORD NO.:  [id number]          PATIENT TYPE:  INP   LOCATION:  2502                         FACILITY:  [facility]   PHYSICIAN:  Arturo Morton. Riley Kill, MD, FACCDATE OF BIRTH:  07/29/1941   DATE OF ADMISSION:  12/06/2008  DATE OF DISCHARGE:  12/07/2008                               DISCHARGE SUMMARY   PRIMARY CARDIOLOGIST:  Maisie Fus D. Riley Kill, MD, [facility]   DISCHARGE DIAGNOSES:  1. Coronary artery disease (status post percutaneous coronary      intervention with drug-eluting stent to left anterior descending).  2. History of acute lateral wall infarction, treated with percutaneous      coronary intervention.   SECONDARY DIAGNOSES:  1. Insulin-dependent diabetes mellitus.  2. Dyslipidemia.  3. Remote knee surgery.   ALLERGIES:  NKDA.   PROCEDURES PERFORMED DURING THIS HOSPITALIZATION:  1. The patient had an EKG performed on December 06, 2008, that showed      sinus rhythm with a rate of 67 bpm, T-wave flattening in V4 and V5,      and T-wave inversion in V6.  Otherwise, no acute changes, no      significant Q-waves, incomplete right bundle-branch block.  2. Cardiac catheterization on December 06, 2008.  Successful percutaneous      stenting of the left anterior descending artery with initial wire      protection of an ostial diagonal stenosis, which remained patent      following stenting.  The angle of incidence of the diagonal was      favorable and there was not a large plaque burden along the margins      of the diagonal and is therefore stable.  3. The patient had an EKG on December 07, 2008, with no significant      changes from prior EKG.   BRIEF HISTORY OF PRESENT ILLNESS:  Mr. Turano is a 70 year old male who  presented to [facility] ED with an acute myocardial infarction.  There was  some ST elevation inferiorly.  The patient has insulin-dependent  diabetes.  He was brought to the cath lab emergently.  With this,  ventriculography was done in the RAO projection with inferior  hypokinesis.  He had diabetic appearing coronary arteries with a  dominant circumflex.  There was a stump occlusion of the first marginal.  He had residual high-grade disease of the LAD without focal lesion and a  modestly high-grade disease of diagonal.  We have treated him  conservatively and overall he has felt well since discharge from the  hospital.  He has not had recurrent symptoms.  He does, however, have  residual high-grade LAD disease.  As a result, he was brought back in  for repeat hospitalization and repeat catheterization.   HOSPITAL COURSE:  The patient admitted and underwent procedures as  described above.  The patient's vital signs remained stable and he  tolerated his procedures well without significant complications.  He was  deemed stable enough for discharge on the morning of December 07, 2008,  with most recent vital signs being temperature 97.7 degrees  Fahrenheit,  BP 102/65, pulse 72, respirations 18, and O2 saturation 95% on room air.  Please note, he was enrolled in [place] registry, clinical trial after  being fully informed of the risk, benefits, participation, and signing  the informed consent document.  The patient will be scheduled for follow  up with Dr. Riley Kill at a to be to determine date, office will call the  patient.  The patient received all his post cath, medication list, and  followup instructions in oral and written form and at the time of  discharge, he had no questions or concerns that had not been addressed.   DISCHARGE MEDICATIONS:  1. Lantus 25 units subcu injection nightly.  2. Plavix 75 mg p.o. daily.  3. Toprol XL 25 mg p.o. daily.  4. Enteric-coated aspirin 325 mg p.o. daily.  5. Simvastatin 20 mg p.o. daily.  6. Amaryl 4 mg p.o. b.i.d.   DURATION OF DISCHARGE ENCOUNTER INCLUDING PHYSICIAN TIME:  35 minutes.      Jarrett Ables, PAC      Arturo Morton. Riley Kill, MD, [facility]   Electronically Signed    MS/MEDQ  D:  12/07/2008  T:  12/07/2008  Job:  [id number]

## 2011-01-26 NOTE — Assessment & Plan Note (Signed)
[facility]                            CARDIOLOGY OFFICE NOTE   BRONSON, BRESSMAN                         MRN:          [id number]  DATE:12/02/2008                            DOB:          Aug 07, 1941    Mr. Youngberg is in for a followup visit.  He returns in followup about 2  weeks after his last visit.  From a clinical standpoint, he continues to  do well.  He denies any ongoing chest pain.   MEDICATIONS:  1. Lantus insulin 25 units q.h.s.  2. Plavix 75 mg daily.  3. Toprol-XL 25 daily.  4. Enteric-coated aspirin 325 mg daily.  5. Simvastatin 20 mg daily.  6. Amaryl 4 mg p.o. b.i.d.   On physical examination, he is alert and oriented, in no distress.  The  blood pressure is 114/76.  The pulse is 68.  The lung fields are clear.  The cardiac rhythm is regular.  There is an S4 gallop.  His groins look  good.   Electrocardiogram today demonstrates normal sinus rhythm with a right  bundle-branch block and T-wave abnormality demonstrating inferior  ischemia.  Echocardiography demonstrates mild increase in aortic valve  thickening with lower normal aortic valve leaflet excursion, ejection  fraction estimate is 45%.  There is mild mitral valvular regurgitation.   IMPRESSION:  1. Coronary artery disease, status post percutaneous stenting of the      circumflex in the setting of acute myocardial infarction with 2.5 x      23 XIENCE V drug-eluting stent.  2. Residual 80% high-grade focal stenosis in the left anterior      descending artery with residual 80% diagonal stenosis.   RECOMMENDATIONS:  I have reviewed the films in detail previously with  Dr. Excell Seltzer.  We have given the patient time to recover from his  myocardial infarction.  He has residual high-grade disease of the LAD.  He has underlying diabetes, and we debated the merits of various  approaches.  At the present time, we will recommend repeat coronary  arteriography.  He is diabetic with  high grade LAD disease.  Depending  upon the findings, we may opt for either dilatation of the LAD and/or  diagonal, or possibly even revascularization surgery.  The risks,  benefits, and alternatives have been discussed with the patient in  detail.  He would prefer percutaneous interventional approach, but I  have discussed with him the long-term implications  of the findings.  We will limit his exposure to contrast because of his  kidneys since we now have echocardiographic information.      Arturo Morton. Riley Kill, MD, [facility]  Electronically Signed    TDS/MedQ  DD: 12/06/2008  DT: 12/06/2008  Job #: [id number]

## 2011-01-26 NOTE — Cardiovascular Report (Signed)
NAMEBERRY, GODSEY NO.:  [id number]   MEDICAL RECORD NO.:  [id number]          PATIENT TYPE:  INP   LOCATION:  2904                         FACILITY:  [facility]   PHYSICIAN:  Arturo Morton. Riley Kill, MD, FACCDATE OF BIRTH:  1941/06/06   DATE OF PROCEDURE:  DATE OF DISCHARGE:                            CARDIAC CATHETERIZATION   INDICATIONS:  Mr. Canal is a 70 year old gentleman from [place].  He  presented today to the [facility] Emergency Room with an acute myocardial  infarction.  EKG revealed right bundle-branch block and evidence of some  inferior ST elevation.  He was seen in the [facility] Emergency Room,  given intravenous heparin and oral clopidogrel.  Following this, he was  transferred promptly by ambulance to the [facility] for re-  perfusion therapy.  He arrived and was evaluated.  Allergies were  reviewed.  Potential risks assessed.  He was quickly prepped for cardiac  catheterization.   PROCEDURES:  1. Left heart catheterization.  2. Selective coronary arteriography.  3. Selective left ventriculography.  4. Percutaneous stenting of the circumflex marginal with a 2.5 x 23      Xience V drug-eluting stent.  5. Femoral closure using an Angio-Seal device.   DESCRIPTION OF PROCEDURE:  The patient was brought to the  catheterization laboratory as noted and prepped and draped in the usual  fashion.  Emergency consent was obtained.  Through an anterior puncture,  the right femoral artery was easily entered and a 6-French sheath was  then placed.  Following this, bivalve routing was given according to  protocol.  The patient had been pretreated with intravenous heparin as  well as 600 mg of oral clopidogrel.  Following this, ACT was  subsequently checked.  Views of the left coronary artery were obtained  demonstrating a total occlusion of the large first marginal with an  evidence of a dominant circumflex system.  Following this, the right  coronary  artery was quickly injected.  ISTAT was obtained and  demonstrated a creatinine of 1.5, therefore every attempt was made to  limit contrast load.  With an appropriate ACT, a JL 3.5 guiding catheter  was then utilized.  We placed a [place] wire down across the total  occlusion.  Predilatation was done with both a 2-mm and 2.25-mm apex  balloons.  Reperfusion was established within 27 minutes of arrival at  [facility].  Following this, we elected to stent with a drug-  eluting stent given the patient's history of diabetes mellitus.  A 2.5 x  23 Xience eluting stent was then placed at the lesion site.  The stent  was then delivered at 14 atmospheres.  There was marked improvement in  the appearance of the artery.  Following this, we elected to post dilate  with a 3.0 x 20 [place] Voyager balloon.  This was taken up to approximately  10 atmospheres and then 12 atmospheres in the middle of the stent.  The  proximal stent was only dilated to about 6 atmospheres.  We subsequently  went back, and postdilated with 3.0 x 12 [place]  Voyager at the proximal end  of the stent and this was taken up to 15-16 atmospheres.  There was  marked improvement in the appearance of the artery.  Subsequently,  catheters were removed.  We did do an RAO cranial to better lay out the  LAD.  We elected to measure left ventricular pressures, and do only a 5  mL hand injection in order to limit contrast load.  Total contrast load  was less than 150 mL.  Following the removal of the pigtail, the  patient's groin was then prepped with Betadine for approximately 5  minutes.  Gloves were exchanged.  An Angio-Seal closure device was then  carefully placed in the right femoral artery without difficulty.  Excellent hemostasis was achieved.  Bivalve routing was subsequently  discontinued.  He was taken to the holding area and subsequently to the  CCU in satisfactory clinical condition.  I explained the results of the  procedure  to the patient and then subsequently to the family.   HEMODYNAMIC DATA:  1. Central aortic pressure was 135/79, mean 105.  2. Left ventricular pressure 128/28.  3. There was no gradient on pullback across the aortic valve.  As an      addendum, intracoronary verapamil was administered to improve      distal perfusion.  Intracoronary nitroglycerin was also done to      maximally vasodilate the vessel.   First medical contact at [facility] was at 14:24.  Code STEMI was  called at 14:49.  the patient arrived at [facility] at 16:04.  first inflation was performed at 16:30.  [place] door in and door out  time was 43 minutes.  [place] door to first device was 26 minutes.  First medical contact to first device was 124 minutes.   ANGIOGRAPHIC DATA:  1. Ventriculography was done with a hand injection.  Therefore,      complete regional wall motion was not assessed.  However, the      inferior wall appeared to move reasonably well.  Ejection fraction      estimate by hand would be in the range of about 50%.  2. The right coronary artery is a small nondominant vessel.  3. The overall coronaries have a modest diabetic appearance.  The left      is a dominant system.  4. The left main is without critical narrowing.  5. The LAD demonstrates some luminal irregularity.  Just after the      takeoff of the diagonal, there is an 80% area of focal stenosis      that overlies and proximal to the septal perforator.  The diagonal      itself is a fairly large vessel, and this vessel has an ostial      stenosis in the range of about 80%.  There is some mid plaquing of      this vessel as well.  The distal LAD has a firm, modest typical      diabetic appearance.  6. The circumflex coronary artery demonstrates a first marginal      branch, which has a stump occlusion.  The AV circumflex results in      a dominant vessel.  There is a small marginal branch, then several      small  posterior vessels constituting the posterior descending      system.  Importantly, the marginal was opened from 100 down to 0%      following stenting.  This is about 2.7 to 3-mm artery.  The vessel      courses distally, and does supply a branch that goes underneath the      heart compatible with a proportion of the inferior wall being      supplied by the distal portions of this vessel.  The vessel distal      to the stent demonstrated some mild spasm, but after intracoronary      nitroglycerin, opened up nicely.   CONCLUSIONS:  1. Reasonably preserved overall left ventricular function with an      estimated ejection fraction of approximately 50%.  2. Acute myocardial infarction due to total occlusion of the first      marginal branch with successful reperfusion using a drug-eluting      stent.  This was utilized in context of the patient's diabetes.  3. High-grade bifurcational stenosis of the LAD diagonal system.  The      actual takeoff of the diagonal is independent of the LAD stenosis.  4. Mild creatinine elevation at 1.5 with attempts to minimize contrast      load.   PLAN:  1. The patient will be treated with aspirin and clopidogrel.  2. Beta-blockade and probably ACE inhibition will be added if the      patient's creatinine remained stable.  3. I will plan to review with my colleagues a residual LAD diagonal      disease.  Potential options will be discussed in detail with my      colleagues.      Arturo Morton. Riley Kill, MD, [facility]  Electronically Signed     TDS/MEDQ  D:  11/09/2008  T:  11/10/2008  Job:  [id number]   cc:   Arturo Morton. Riley Kill, MD, [facility]  CV Laboratory  Dr. Earna Coder

## 2011-01-26 NOTE — Discharge Summary (Signed)
NAMECRUZ, BONG NO.:  [id number]   MEDICAL RECORD NO.:  [id number]          PATIENT TYPE:  INP   LOCATION:  4711                         FACILITY:  [facility]   PHYSICIAN:  Arturo Morton. Riley Kill, MD, FACCDATE OF BIRTH:  [birth year]   DATE OF ADMISSION:  11/09/2008  DATE OF DISCHARGE:  11/13/2008                               DISCHARGE SUMMARY   PRIMARY CARDIOLOGIST:  Maisie Fus D. Riley Kill, MD, [facility]   PRIMARY CARE PHYSICIAN:  Dr. Richarda Blade in [place].   PROCEDURES PERFORMED DURING HOSPITALIZATION:  1. Cardiac catheterization dated November 09, 2008.  A:  Total occlusion of OM-1, residual LAD/diagonal disease.  1. Status post drug-eluting stent to the OM-1 with successful      reperfusion.  This is a Xience 2.5-mm x 23-mm drug-eluting stent.   FINAL DISCHARGE DIAGNOSES:  1. ST-elevated myocardial infarction on November 09, 2008.  2. Status post drug-eluting stent to the first obtuse marginal      secondary to 100% occlusion.  3. Diabetes.  4. Renal insufficiency.   HOSPITAL COURSE:  This is a 70 year old gentleman who was admitted on  November 09, 2008, emergently to the Cardiac Catheterization Lab for a  code STEMI.  The patient had no prior history of coronary artery disease  with a long history of diabetes and hypercholesterolemia.  The patient  was seen by Dr. Shawnie Pons and taken urgently to the lab and  catheterization revealed 100% occlusion of the OM-1 which had successful  PCI using a drug-eluting stent reducing it from 100% to 0%.  The patient  had some residual disease in the first diagonal of the LAD and we will  have intervention on that at a later date.   It was found that the patient had some mild renal insufficiency post  procedure with elevated LFTs as well.  His troponins were greater than  100.  The patient was hydrated and monitored closely.   The patient was also found to have uncontrolled diabetes.  He had been  on Glucophage,  metformin, and Amaryl prior to admission.  The Glucophage  and metformin were discontinued and the patient was started on  subcutaneous insulin.  The patient was given diabetic instruction on  diet along with instructions on self injection.  The patient's  hemoglobin A1c was found to be greater than 8 on admission.  Chest x-ray  also revealed some bronchitic-type lung changes with no effusions or  focal infiltrates and the patient did have some inspiratory wheezing  throughout hospitalization.   The patient's creatinine did improve throughout hospitalization with  highest at 1.49, but discharge creatinine was 1.3.  The patient's LFTs  were also monitored and they did improve with an AST decreasing from 98-  52.  The patient was continued on low-dose simvastatin with titration to  be completed as an outpatient depending upon LFTs returning to normal.   On the day of discharge, the patient was seen and examined by Dr. Shawnie Pons and found to be stable.  He was mildly hypotensive with a blood  pressure of 94/60 and heart rate  of 73, respirations 20.  He was  afebrile.  His creatinine was 1.3 and the patient was anxious to return  home.  The patient's groin site was inspected without evidence of  bleeding, hematoma, or signs of infection.   DISCHARGE LABORATORY DATA:  Sodium 135, potassium 3.8, chloride 103, CO2  of 25, BUN 20, creatinine 1.3, and glucose 189.  AST 52 and ALT 31.  Troponin greater than 100 and decreased to 56.  Hemoglobin 14.3,  hematocrit 41.1, white blood cells 10.6, and platelets 181.  EKG dated  November 11, 2008, revealing normal sinus rhythm, right bundle-branch block  with inferior Q-waves noted.   DISCHARGE MEDICATIONS:  1. Lantus insulin 15 units at bedtime.  2. Plavix 75 mg p.o. daily.  3. Toprol-XL 25 mg daily.  4. Nitroglycerin 0.4 mg as needed for chest pain.  5. Aspirin 325 daily.  6. Simvastatin 20 mg daily.  7. Amaryl 4 mg twice daily.   Note, the  patient has been advised to not take metformin or Actos as an  outpatient, but to only take medications listed above.   FOLLOWUP PLANS AND APPOINTMENT:  1,  The patient will follow up with Dr.  Shawnie Pons on Thursday, November 21, 2008, at 2:45 p.m.  1. Discussion will be made at that time for staged PCI of the first      diagonal LAD at Dr. Rosalyn Charters discretion.  2. The patient will need followup lipids and LFTs within 6 weeks to      evaluate liver status with use of statin.  3. The patient is to follow up with primary care physician, Dr. Richarda Blade in [place].  She will continue to monitor diabetes      treatment.  4. The patient has been given postcardiac catheterization instructions      with particular emphasis on the right groin site for evidence of      bleeding, hematoma, or signs of infection.   Time spent with the patient to include physician time 40 minutes.      Bettey Mare. Lyman Bishop, NP      Arturo Morton. Riley Kill, MD, [facility]  Electronically Signed    KML/MEDQ  D:  11/13/2008  T:  11/13/2008  Job:  [id number]   cc:   Graciella Belton Dr. Mechele Collin

## 2011-07-19 ENCOUNTER — Telehealth: Payer: Self-pay | Admitting: Cardiology

## 2011-07-19 NOTE — Telephone Encounter (Signed)
Patient's wife called to let Dr. Rosalyn Charters nurse know that she will bring pt's disability papers today at the front desk for MD. It only need to fill out  a small part  of one  page and sign,  And she can send it in to the insurance.

## 2011-07-19 NOTE — Telephone Encounter (Signed)
New message Please call about paperwork about what he can physically do.  So he can turn it in to [employer].

## 2011-07-20 ENCOUNTER — Telehealth: Payer: Self-pay | Admitting: Cardiology

## 2011-07-20 NOTE — Telephone Encounter (Signed)
Walk in Pt Form " Pt Dropped off Physicians Statement" sent to Lauren  07/20/11/km

## 2011-07-22 NOTE — Telephone Encounter (Signed)
I spoke with the pt's wife and made her aware that I did get the paperwork she brought into the office.  I will have Dr Riley Kill complete this paperwork when he is back in the office.  The form will need to be faxed and the pt would like a copy of form mailed to his home.

## 2011-07-22 NOTE — Telephone Encounter (Signed)
Pt wife calling to make sure that Lauren received pt disability paper work pt brought in. Please return call to discuss further.

## 2011-07-23 ENCOUNTER — Ambulatory Visit: Payer: Medicare Other | Admitting: Cardiology

## 2011-07-27 NOTE — Telephone Encounter (Signed)
I spoke with the pt's wife and made her aware that the pt's paperwork is in Dr Rosalyn Charters folder for completion. Dr Riley Kill will be back in the office next week.

## 2011-07-27 NOTE — Telephone Encounter (Signed)
Pt's wife is calling about the paperwork. She wants you to call her

## 2011-08-03 NOTE — Telephone Encounter (Signed)
F/up previous call Pt wants to know about paperwork Please call

## 2011-08-03 NOTE — Telephone Encounter (Signed)
I spoke with the pt's wife and let her know that the form was completed today and will be faxed to [phone]. I will also mail a copy of the paperwork to the pt's home.

## 2011-08-17 ENCOUNTER — Ambulatory Visit: Payer: Medicare Other | Admitting: Cardiology

## 2011-09-14 ENCOUNTER — Other Ambulatory Visit: Payer: Self-pay | Admitting: Cardiology

## 2011-10-14 ENCOUNTER — Ambulatory Visit: Payer: Medicare Other | Admitting: Cardiology

## 2012-03-21 ENCOUNTER — Ambulatory Visit: Payer: Medicare Other | Admitting: Cardiology

## 2012-04-05 ENCOUNTER — Ambulatory Visit (INDEPENDENT_AMBULATORY_CARE_PROVIDER_SITE_OTHER): Payer: Medicare Other | Admitting: Cardiology

## 2012-04-05 ENCOUNTER — Encounter: Payer: Self-pay | Admitting: Cardiology

## 2012-04-05 VITALS — BP 108/63 | HR 80 | Ht 69.0 in | Wt 196.8 lb

## 2012-04-05 DIAGNOSIS — E7800 Pure hypercholesterolemia, unspecified: Secondary | ICD-10-CM

## 2012-04-05 DIAGNOSIS — I2510 Atherosclerotic heart disease of native coronary artery without angina pectoris: Secondary | ICD-10-CM

## 2012-04-05 DIAGNOSIS — J984 Other disorders of lung: Secondary | ICD-10-CM

## 2012-04-05 NOTE — Assessment & Plan Note (Signed)
He is scheduled to get his lipid and liver profile with his primary care Dr. Dr. Jorene Guest next week in [place]

## 2012-04-05 NOTE — Assessment & Plan Note (Signed)
Patient did not have his followup CAT scan and then was was anticipated. His wife got sick, he thinks he just forgot. We will try to find out this was done, in the meantime we will make arrangements for a followup noncontrast CT interfacility here. He is wife will go over to the facility at which she had this, and see if they can get the records.

## 2012-04-05 NOTE — Assessment & Plan Note (Signed)
No recurrent symptoms at the present time. He continues to get along reasonably well.

## 2012-04-05 NOTE — Progress Notes (Signed)
   HPI:  Patient seen today in followup. From a clinical standpoint, is getting along well. He has not smoked in nearly 10 years. He did have an abnormal chest x-ray, and had a CAT scan done. Repeat was scheduled for one year, but his insurance changed and he was not able to follow with his primary care Dr. Mechele Collin in [place].  Now seeing Dr. Jorene Guest in [place].  No chest pain.  No cough or weight loss.    Current Outpatient Prescriptions  Medication Sig Dispense Refill  . aspirin 81 MG tablet Take 81 mg by mouth daily.        Marland Kitchen glipiZIDE (GLUCOTROL) 10 MG tablet Take 10 mg by mouth 2 (two) times daily before a meal.        . insulin aspart (NOVOLOG) 100 UNIT/ML injection Inject 4 Units into the skin. Before dinner,       . insulin glargine (LANTUS) 100 UNIT/ML injection Inject 80 Units into the skin at bedtime. 80 to 85 units at night      . metoprolol (TOPROL-XL) 50 MG 24 hr tablet TAKE 1 TABLET BY MOUTH ONCE A DAY  30 tablet  6  . nitroGLYCERIN (NITROSTAT) 0.4 MG SL tablet Place 0.4 mg under the tongue every 5 (five) minutes as needed.        . simvastatin (ZOCOR) 40 MG tablet Take 40 mg by mouth at bedtime.          No Known Allergies  Past Medical History  Diagnosis Date  . Diabetes mellitus   . Dyslipidemia   . Renal insufficiency   . MI (myocardial infarction) feb 27/10    Past Surgical History  Procedure Date  . Knee surgery     Family History  Problem Relation Age of Onset  . Heart attack Brother     x2 brothers    History   Social History  . Marital Status: Married    Spouse Name: N/A    Number of Children: N/A  . Years of Education: N/A   Occupational History  . Not on file.   Social History Main Topics  . Smoking status: Never Smoker   . Smokeless tobacco: Not on file  . Alcohol Use: No  . Drug Use: No  . Sexually Active: Not on file   Other Topics Concern  . Not on file   Social History Narrative   The patient lives in [place] with  the family. He denies any tobacco use.    ROS: Please see the HPI.  All other systems reviewed and negative.  PHYSICAL EXAM:  BP 108/63  Pulse 80  Ht 5\' 9"  (1.753 m)  Wt 196 lb 12.8 oz (89.268 kg)  BMI 29.06 kg/m2  General: Well developed, well nourished, in no acute distress. Head:  Normocephalic and atraumatic. Neck: no JVD Lungs: basilar ronchii.  No definite rales.   Heart: Normal S1 and S2.  No murmur, rubs or gallops.  Abdomen:  Normal bowel sounds; soft; non tender; no organomegaly Pulses: Pulses normal in all 4 extremities. Extremities: No clubbing or cyanosis. No edema. Neurologic: Alert and oriented x 3.  EKG:  NSR.  RBBB. LAFB/  Inferior MI, old. No change from prior tracing.   ASSESSMENT AND PLAN:

## 2012-04-05 NOTE — Patient Instructions (Addendum)
Your physician recommends that you schedule a follow-up appointment in: 3 MONTHS with Dr Riley Kill  Your physician recommends that you continue on your current medications as directed. Please refer to the Current Medication list given to you today.  Non-Cardiac CT scanning, (CAT scanning), is a noninvasive, special x-ray that produces cross-sectional images of the body using x-rays and a computer. CT scans help physicians diagnose and treat medical conditions. For some CT exams, a contrast material is used to enhance visibility in the area of the body being studied. CT scans provide greater clarity and reveal more details than regular x-ray exams. (CT of Chest without contrast)

## 2012-04-11 ENCOUNTER — Ambulatory Visit (INDEPENDENT_AMBULATORY_CARE_PROVIDER_SITE_OTHER)
Admission: RE | Admit: 2012-04-11 | Discharge: 2012-04-11 | Disposition: A | Discharge status: Home / Self Care | Payer: Medicare Other | Source: Ambulatory Visit | Attending: Cardiology | Admitting: Cardiology

## 2012-04-11 DIAGNOSIS — E7800 Pure hypercholesterolemia, unspecified: Secondary | ICD-10-CM

## 2012-04-11 DIAGNOSIS — J984 Other disorders of lung: Secondary | ICD-10-CM

## 2012-04-11 DIAGNOSIS — I2510 Atherosclerotic heart disease of native coronary artery without angina pectoris: Secondary | ICD-10-CM

## 2012-04-13 ENCOUNTER — Telehealth: Payer: Self-pay | Admitting: Cardiology

## 2012-04-13 DIAGNOSIS — R9389 Abnormal findings on diagnostic imaging of other specified body structures: Secondary | ICD-10-CM

## 2012-04-13 NOTE — Telephone Encounter (Signed)
Pt would like the results of his CT scan

## 2012-04-13 NOTE — Telephone Encounter (Signed)
Discussed with Dr Riley Kill  Patient needs to be set up with pulmonary and also needs to have an ultrasound of his thyroid.  I have called the patient and let him know the results.  He says they brought in CD's of the CT's from [place] when they had last appointment with Dr Riley Kill.  We will need to find these and have them sent over to [facility] so Dr  Purcell Mouton can compare the studies.  I let him know I would order the ultrasound and put in the referral for pulmonary.

## 2012-04-14 ENCOUNTER — Telehealth: Payer: Self-pay | Admitting: Cardiology

## 2012-04-14 NOTE — Telephone Encounter (Signed)
Spoke to patient's wife she stated husband did get a phone call yesterday 04/13/12 with results of chest ct, but did not understand what was told to him.States husband is hard of hearing.Wife was told Dr.Stuckey's nurse not in office today will forward message to her and she will call wife back next week.

## 2012-04-14 NOTE — Telephone Encounter (Signed)
Pt's wife calling re ct scan

## 2012-04-18 NOTE — Telephone Encounter (Signed)
CT scan has been compared and appended.  I spoke with the pt's wife and made her aware of CT findings.  Orders have been placed for thyroid ultrasound and referral to pulmonary. At this time the pt does not want to proceed with any further testing.  The pt's wife said she has tried to explain to the pt why he needs to have further testing.   I asked her to have the pt call our office and I would be happy to explain to the pt why further testing is recommended. At this time our patient care coordinator will hold off on contacting the pt for appointments until the pt agrees to have further testing. I will make Dr Riley Kill aware of this information.

## 2012-04-18 NOTE — Telephone Encounter (Signed)
CT scan has been compared and appended.  I will contact the pt's wife and make her aware that our [facility] will contact the pt to schedule thyroid ultrasound and referral to pulmonary. Please see further documentation in 04/14/12 phone note.

## 2012-04-18 NOTE — Telephone Encounter (Signed)
F/u   Patient wife calling for test result update

## 2012-04-24 ENCOUNTER — Other Ambulatory Visit: Payer: Self-pay | Admitting: Cardiology

## 2012-04-24 NOTE — Telephone Encounter (Signed)
Fax Received. Refill Completed. Lucas Morgan (R.M.A)   

## 2012-06-05 NOTE — Telephone Encounter (Signed)
I had spoken with him further, and he said he would agree to further testing.  Lauren--please make appointments.  Thanks. TS

## 2012-06-19 NOTE — Telephone Encounter (Signed)
Our patient care coordinator has called the pt's home multiple times to arrange appointments.  Lucas Morgan has spoken with the pt's wife and she will not make the pt's appointments and states that the pt will call our office.  The pt is scheduled to see Dr Riley Kill on 07/05/12 and we will discuss the need for appointments at that time if the pt has not contacted our office.

## 2012-07-05 ENCOUNTER — Ambulatory Visit: Payer: Medicare Other | Admitting: Cardiology

## 2012-08-08 ENCOUNTER — Ambulatory Visit: Payer: Medicare Other | Admitting: Cardiology

## 2012-09-18 ENCOUNTER — Ambulatory Visit: Payer: Medicare Other | Admitting: Cardiology

## 2012-10-06 ENCOUNTER — Ambulatory Visit: Payer: Medicare Other | Admitting: Cardiology

## 2012-10-27 ENCOUNTER — Ambulatory Visit: Payer: Medicare Other | Admitting: Cardiology

## 2013-01-11 ENCOUNTER — Emergency Department (HOSPITAL_COMMUNITY): Payer: Medicare Other

## 2013-01-11 ENCOUNTER — Encounter (HOSPITAL_COMMUNITY): Payer: Self-pay | Admitting: Emergency Medicine

## 2013-01-11 ENCOUNTER — Emergency Department (HOSPITAL_COMMUNITY)
Admission: EM | Admit: 2013-01-11 | Discharge: 2013-01-11 | Disposition: A | Discharge status: Home / Self Care | Payer: Medicare Other | Attending: Emergency Medicine | Admitting: Emergency Medicine

## 2013-01-11 DIAGNOSIS — Y92009 Unspecified place in unspecified non-institutional (private) residence as the place of occurrence of the external cause: Secondary | ICD-10-CM | POA: Insufficient documentation

## 2013-01-11 DIAGNOSIS — M5136 Other intervertebral disc degeneration, lumbar region: Secondary | ICD-10-CM

## 2013-01-11 DIAGNOSIS — Z79899 Other long term (current) drug therapy: Secondary | ICD-10-CM | POA: Insufficient documentation

## 2013-01-11 DIAGNOSIS — Z794 Long term (current) use of insulin: Secondary | ICD-10-CM | POA: Insufficient documentation

## 2013-01-11 DIAGNOSIS — E119 Type 2 diabetes mellitus without complications: Secondary | ICD-10-CM | POA: Insufficient documentation

## 2013-01-11 DIAGNOSIS — E785 Hyperlipidemia, unspecified: Secondary | ICD-10-CM | POA: Insufficient documentation

## 2013-01-11 DIAGNOSIS — M51379 Other intervertebral disc degeneration, lumbosacral region without mention of lumbar back pain or lower extremity pain: Secondary | ICD-10-CM | POA: Insufficient documentation

## 2013-01-11 DIAGNOSIS — Z87448 Personal history of other diseases of urinary system: Secondary | ICD-10-CM | POA: Insufficient documentation

## 2013-01-11 DIAGNOSIS — S32000A Wedge compression fracture of unspecified lumbar vertebra, initial encounter for closed fracture: Secondary | ICD-10-CM

## 2013-01-11 DIAGNOSIS — W010XXA Fall on same level from slipping, tripping and stumbling without subsequent striking against object, initial encounter: Secondary | ICD-10-CM | POA: Insufficient documentation

## 2013-01-11 DIAGNOSIS — M5137 Other intervertebral disc degeneration, lumbosacral region: Secondary | ICD-10-CM | POA: Insufficient documentation

## 2013-01-11 DIAGNOSIS — Y939 Activity, unspecified: Secondary | ICD-10-CM | POA: Insufficient documentation

## 2013-01-11 DIAGNOSIS — S32009A Unspecified fracture of unspecified lumbar vertebra, initial encounter for closed fracture: Secondary | ICD-10-CM | POA: Insufficient documentation

## 2013-01-11 DIAGNOSIS — I252 Old myocardial infarction: Secondary | ICD-10-CM | POA: Insufficient documentation

## 2013-01-11 DIAGNOSIS — Z7982 Long term (current) use of aspirin: Secondary | ICD-10-CM | POA: Insufficient documentation

## 2013-01-11 LAB — GLUCOSE, CAPILLARY

## 2013-01-11 MED ORDER — ONDANSETRON HCL 4 MG PO TABS
4.0000 mg | ORAL_TABLET | Freq: Once | ORAL | Status: AC
  Administered 2013-01-11: 4 mg via ORAL
  Filled 2013-01-11: qty 1

## 2013-01-11 MED ORDER — HYDROCODONE-ACETAMINOPHEN 5-325 MG PO TABS: ORAL_TABLET | ORAL | Status: DC

## 2013-01-11 MED ORDER — METHOCARBAMOL 500 MG PO TABS: 500.0000 mg | ORAL_TABLET | Freq: Three times a day (TID) | ORAL | Status: DC

## 2013-01-11 MED ORDER — HYDROCODONE-ACETAMINOPHEN 5-325 MG PO TABS
1.0000 | ORAL_TABLET | Freq: Once | ORAL | Status: AC
  Administered 2013-01-11: 1 via ORAL
  Filled 2013-01-11: qty 1

## 2013-01-11 MED ORDER — IBUPROFEN 800 MG PO TABS
800.0000 mg | ORAL_TABLET | Freq: Once | ORAL | Status: AC
  Administered 2013-01-11: 800 mg via ORAL
  Filled 2013-01-11: qty 1

## 2013-01-11 NOTE — ED Notes (Signed)
Slipped at home on Sunday hurting to lower middle back. Pt able to walk with difficulty and pain.

## 2013-01-11 NOTE — ED Provider Notes (Signed)
History     CSN: [id number]  Arrival date & time 01/11/13  5621   First MD Initiated Contact with Patient 01/11/13 1028      Chief Complaint  Patient presents with  . Fall  . Back Pain    (Consider location/radiation/quality/duration/timing/severity/associated sxs/prior treatment) Patient is a 72 y.o. male presenting with fall and back pain. The history is provided by the patient.  Fall The accident occurred more than 2 days ago. Fall occurred: Pt slipped on a wet floor and injured the lower back. He landed on a hard floor. Point of impact: left side and lower back. Pain location: lower back. The pain is at a severity of 10/10. The pain is moderate. He was ambulatory at the scene. Pertinent negatives include no fever, no numbness, no abdominal pain, no bowel incontinence, no hematuria and no loss of consciousness. Exacerbated by: change of position and standing.  Back Pain Associated symptoms: no abdominal pain, no bowel incontinence, no chest pain, no dysuria, no fever and no numbness     Past Medical History  Diagnosis Date  . Diabetes mellitus   . Dyslipidemia   . Renal insufficiency   . MI (myocardial infarction) feb 27/10    Past Surgical History  Procedure Laterality Date  . Knee surgery    . Coronary stent placement      Family History  Problem Relation Age of Onset  . Heart attack Brother     x2 brothers    History  Substance Use Topics  . Smoking status: Never Smoker   . Smokeless tobacco: Not on file  . Alcohol Use: No      Review of Systems  Constitutional: Negative for fever and activity change.       All ROS Neg except as noted in HPI  HENT: Negative for nosebleeds and neck pain.   Eyes: Negative for photophobia and discharge.  Respiratory: Negative for cough, shortness of breath and wheezing.   Cardiovascular: Negative for chest pain and palpitations.  Gastrointestinal: Negative for abdominal pain, blood in stool and bowel incontinence.   Genitourinary: Negative for dysuria, frequency and hematuria.  Musculoskeletal: Positive for back pain. Negative for arthralgias.  Skin: Negative.   Neurological: Negative for dizziness, seizures, loss of consciousness, speech difficulty and numbness.  Psychiatric/Behavioral: Negative for hallucinations and confusion.    Allergies  Review of patient's allergies indicates no known allergies.  Home Medications   Current Outpatient Rx  Name  Route  Sig  Dispense  Refill  . aspirin 81 MG tablet   Oral   Take 81 mg by mouth daily.           Marland Kitchen glipiZIDE (GLUCOTROL) 10 MG tablet   Oral   Take 10 mg by mouth 2 (two) times daily before a meal.           . insulin glargine (LANTUS) 100 UNIT/ML injection   Subcutaneous   Inject 80 Units into the skin at bedtime. 80 to 85 units at night         . simvastatin (ZOCOR) 40 MG tablet   Oral   Take 40 mg by mouth at bedtime.             BP 125/74  Pulse 100  Temp(Src) 97.4 F (36.3 C) (Oral)  Resp 18  Ht 5\' 8"  (1.727 m)  Wt 190 lb (86.183 kg)  BMI 28.9 kg/m2  SpO2 95%  Physical Exam  Nursing note and vitals reviewed. Constitutional: He is oriented  to person, place, and time. He appears well-developed and well-nourished.  Non-toxic appearance.  HENT:  Head: Normocephalic.  Right Ear: Tympanic membrane and external ear normal.  Left Ear: Tympanic membrane and external ear normal.  Eyes: EOM and lids are normal. Pupils are equal, round, and reactive to light.  Neck: Normal range of motion. Neck supple. Carotid bruit is not present.  Cardiovascular: Normal rate, regular rhythm, normal heart sounds, intact distal pulses and normal pulses.   Pulmonary/Chest: Breath sounds normal. No respiratory distress.  Coarse breath sounds present with rhonchi bilaterally.  Abdominal: Soft. Bowel sounds are normal. There is no tenderness. There is no guarding.  Musculoskeletal: Normal range of motion.  No bruising of the back noted. No  palpable step off of the lumbar area. Mild to moderate tenderness in the sacral coccyx area to palpation. Pain to the lower back with change of position. No hot areas appreciated.  Lymphadenopathy:       Head (right side): No submandibular adenopathy present.       Head (left side): No submandibular adenopathy present.    He has no cervical adenopathy.  Neurological: He is alert and oriented to person, place, and time. He has normal strength. No cranial nerve deficit or sensory deficit.  Skin: Skin is warm and dry.  Psychiatric: He has a normal mood and affect. His speech is normal.    ED Course  Procedures (including critical care time)  Labs Reviewed  GLUCOSE, CAPILLARY - Abnormal; Notable for the following:    Glucose-Capillary 184 (*)    All other components within normal limits   No results found.   No diagnosis found.    MDM  I have reviewed nursing notes, vital signs, and all appropriate lab and imaging results for this patient. Patient states that he slipped on wet floor on April 27. He sustained injury to the lower back. He has not had any hemoptysis, or hematochezia. He's had increasing pain and stiffness of his lower back area.  After the examination and explanation of the plan, the family reports that the patient was seen earlier today at the [facility] family medicine facility. They report that the practitioners there, the patient to have a" CT scan". Patient will receive medication for his discomfort and proceed with imaging.  CT scan of L spine reveals compression fx of L4, and DDD of the L4L5 area. Xray of the sacrum/coccyx is negative for fx. Results given to pt and family. Results called to [facility]. Plan - norco and robaxin ordered.     Kathie Dike, PA-C 01/15/13 2250

## 2013-01-11 NOTE — ED Notes (Signed)
Pt's daughter states they have a wrap around corsett in the car he dad has been using. RX given to have pt fitted if he feels like the one he has is not working

## 2013-01-17 NOTE — ED Provider Notes (Signed)
Medical screening examination/treatment/procedure(s) were performed by non-physician practitioner and as supervising physician I was immediately available for consultation/collaboration.  Allysen Lazo R. Shanvi Moyd, MD 01/17/13 0804 

## 2013-02-10 ENCOUNTER — Emergency Department (HOSPITAL_COMMUNITY)
Admission: EM | Admit: 2013-02-10 | Discharge: 2013-02-10 | Disposition: A | Discharge status: Home / Self Care | Payer: Medicare Other | Attending: Emergency Medicine | Admitting: Emergency Medicine

## 2013-02-10 ENCOUNTER — Encounter (HOSPITAL_COMMUNITY): Payer: Self-pay | Admitting: *Deleted

## 2013-02-10 DIAGNOSIS — S32000D Wedge compression fracture of unspecified lumbar vertebra, subsequent encounter for fracture with routine healing: Secondary | ICD-10-CM

## 2013-02-10 DIAGNOSIS — Z7982 Long term (current) use of aspirin: Secondary | ICD-10-CM | POA: Insufficient documentation

## 2013-02-10 DIAGNOSIS — M549 Dorsalgia, unspecified: Secondary | ICD-10-CM

## 2013-02-10 DIAGNOSIS — S32009A Unspecified fracture of unspecified lumbar vertebra, initial encounter for closed fracture: Secondary | ICD-10-CM | POA: Insufficient documentation

## 2013-02-10 DIAGNOSIS — E785 Hyperlipidemia, unspecified: Secondary | ICD-10-CM | POA: Insufficient documentation

## 2013-02-10 DIAGNOSIS — Z87448 Personal history of other diseases of urinary system: Secondary | ICD-10-CM | POA: Insufficient documentation

## 2013-02-10 DIAGNOSIS — IMO0002 Reserved for concepts with insufficient information to code with codable children: Secondary | ICD-10-CM | POA: Insufficient documentation

## 2013-02-10 DIAGNOSIS — E119 Type 2 diabetes mellitus without complications: Secondary | ICD-10-CM | POA: Insufficient documentation

## 2013-02-10 DIAGNOSIS — W010XXA Fall on same level from slipping, tripping and stumbling without subsequent striking against object, initial encounter: Secondary | ICD-10-CM | POA: Insufficient documentation

## 2013-02-10 DIAGNOSIS — Y92009 Unspecified place in unspecified non-institutional (private) residence as the place of occurrence of the external cause: Secondary | ICD-10-CM | POA: Insufficient documentation

## 2013-02-10 DIAGNOSIS — Y93E5 Activity, floor mopping and cleaning: Secondary | ICD-10-CM | POA: Insufficient documentation

## 2013-02-10 DIAGNOSIS — Z79899 Other long term (current) drug therapy: Secondary | ICD-10-CM | POA: Insufficient documentation

## 2013-02-10 DIAGNOSIS — R209 Unspecified disturbances of skin sensation: Secondary | ICD-10-CM | POA: Insufficient documentation

## 2013-02-10 DIAGNOSIS — I252 Old myocardial infarction: Secondary | ICD-10-CM | POA: Insufficient documentation

## 2013-02-10 DIAGNOSIS — Z9861 Coronary angioplasty status: Secondary | ICD-10-CM | POA: Insufficient documentation

## 2013-02-10 MED ORDER — OXYCODONE-ACETAMINOPHEN 7.5-325 MG PO TABS: 1.0000 | ORAL_TABLET | ORAL | Status: DC | PRN

## 2013-02-10 MED ORDER — OXYCODONE-ACETAMINOPHEN 5-325 MG PO TABS
2.0000 | ORAL_TABLET | Freq: Once | ORAL | Status: AC
  Administered 2013-02-10: 2 via ORAL
  Filled 2013-02-10: qty 2

## 2013-02-10 MED ORDER — NAPROXEN 500 MG PO TABS: 500.0000 mg | ORAL_TABLET | Freq: Two times a day (BID) | ORAL | Status: DC

## 2013-02-10 NOTE — ED Notes (Signed)
Pt's MRI scheduled for 4pm Monday, pt verbalized understanding

## 2013-02-10 NOTE — ED Notes (Signed)
Pt alert & oriented x4, stable gait. Patient given discharge instructions, paperwork & prescription(s). Patient  instructed to stop at the registration desk to finish any additional paperwork. Patient verbalized understanding. Pt left department w/ no further questions. 

## 2013-02-10 NOTE — ED Notes (Signed)
Pt states that he injured his lower back three weeks ago after a fall in the kitchen, has been seen by [facility] with no improvement, presents to er with c/o pain is not getting any better, denies any new injury. No problems with urinary or bowel movement,

## 2013-02-10 NOTE — ED Provider Notes (Signed)
History    This chart was scribed for Vida Roller, MD by Leone Payor, ED Scribe. This patient was seen in room APA10/[facility]10 and the patient's care was started 7:44 AM.   CSN: [id number]  Arrival date & time 02/10/13  0722   First MD Initiated Contact with Patient 02/10/13 0725      Chief Complaint  Patient presents with  . Back Pain     The history is provided by the patient. No language interpreter was used.    HPI Comments: Lucas Morgan is a 72 y.o. male who presents to the Emergency Department complaining of ongoing, constant, unchanged lower back pain starting 3 weeks ago after a fall. Reports he slipped and fell in the kitchen while mopping a wet floor. He had a CT scan of his back 2 weeks ago that showed a fracture. He denies having similar back pain prior to his fall. Pain is worsened with standing and alleviated by sitting down. States it does not hurt him to climb stairs. He is taking Hydrocodone 7.5 and robaxin 500 along with advil. He denies abdominal pain, dysuria, numbness in legs, change in bowel or bladder function. Pt is able to ambulate and denies numbness, urinary complaints, or fever. No pathelogical red flags. Pt has had multiple evaluations and is being followed closely by family doctor.  Past Medical History  Diagnosis Date  . Diabetes mellitus   . Dyslipidemia   . Renal insufficiency   . MI (myocardial infarction) feb 27/10    Past Surgical History  Procedure Laterality Date  . Knee surgery    . Coronary stent placement      Family History  Problem Relation Age of Onset  . Heart attack Brother     x2 brothers    History  Substance Use Topics  . Smoking status: Never Smoker   . Smokeless tobacco: Not on file  . Alcohol Use: No      Review of Systems  Constitutional: Negative for fever.  Musculoskeletal: Positive for back pain.  Neurological: Positive for numbness.     Allergies  Review of patient's allergies indicates no known  allergies.  Home Medications   Current Outpatient Rx  Name  Route  Sig  Dispense  Refill  . aspirin 81 MG tablet   Oral   Take 81 mg by mouth daily.           Marland Kitchen glipiZIDE (GLUCOTROL) 10 MG tablet   Oral   Take 10 mg by mouth 2 (two) times daily before a meal.           . HYDROcodone-acetaminophen (NORCO/VICODIN) 5-325 MG per tablet      1 or 2 po q4h prn pain   20 tablet   0   . insulin glargine (LANTUS) 100 UNIT/ML injection   Subcutaneous   Inject 80 Units into the skin at bedtime. 80 to 85 units at night         . methocarbamol (ROBAXIN) 500 MG tablet   Oral   Take 1 tablet (500 mg total) by mouth 3 (three) times daily.   21 tablet   0   . naproxen (NAPROSYN) 500 MG tablet   Oral   Take 1 tablet (500 mg total) by mouth 2 (two) times daily with a meal.   30 tablet   0   . oxyCODONE-acetaminophen (PERCOCET) 7.5-325 MG per tablet   Oral   Take 1 tablet by mouth every 4 (four) hours as needed  for pain.   20 tablet   0   . simvastatin (ZOCOR) 40 MG tablet   Oral   Take 40 mg by mouth at bedtime.             BP 142/84  Pulse 85  Temp(Src) 97 F (36.1 C) (Oral)  Resp 20  SpO2 98%  Physical Exam  Nursing note and vitals reviewed. Constitutional: He is oriented to person, place, and time. He appears well-developed and well-nourished. No distress.  HENT:  Head: Normocephalic and atraumatic.  Eyes: Conjunctivae are normal. Right eye exhibits no discharge. Left eye exhibits no discharge.  Neck: Neck supple. No tracheal deviation present.  Cardiovascular: Normal rate, regular rhythm and normal heart sounds.   Pulmonary/Chest: Effort normal and breath sounds normal. No respiratory distress.  Abdominal: Soft. There is no tenderness.  Musculoskeletal: Normal range of motion. He exhibits no edema and no tenderness.  No tenderness over lumbar or sacral spine.     Neurological: He is alert and oriented to person, place, and time.  Neurologic  exam:  Speech clear, pupils equal round reactive to light, extraocular movements intact  Normal peripheral visual fields Cranial nerves III through XII normal including no facial droop Follows commands, moves all extremities x4, normal strength to bilateral upper and lower extremities at all major muscle groups including grip Sensation normal to light touch and pinprick Coordination intact, no limb ataxia, finger-nose-finger normal Rapid alternating movements normal No pronator drift Gait normal     Skin: Skin is warm and dry.  Psychiatric: He has a normal mood and affect. His behavior is normal.    ED Course  Procedures (including critical care time)  DIAGNOSTIC STUDIES: Oxygen Saturation is 98% on room air, normal by my interpretation.    COORDINATION OF CARE: 7:44 AM Discussed treatment plan with pt at bedside and pt agreed to plan.   Labs Reviewed - No data to display No results found.   1. Compression fracture of lumbar vertebra, with routine healing, subsequent encounter   2. Back pain       MDM  Pt is well appearing and though he has RLB pain he has no red flags for pathologic sources - he has CT confirming superior endplate compression fracture and some spinal stenosis - MRI ordered, pt otherwise appears stable and has been given return precautions.  The pt was given opiate type medications while in the emergency dept.  The patient was instructed on the possible side effects and potential allergic reactions associated with said medications and agreed to their use.  I also instructed the patient not to perform certain activities after use of these medications such as driving a vehicle and performing child care.  They were instructed not to ingest alcohol or other medications that may cause excessive sleepiness, tranquilizers or CNS depressant medications.  They have expressed their understanding.  If the pt was given opiate medications for home by prescription they were  reminded of these precautions as well.  Meds given in ED:  Medications  oxyCODONE-acetaminophen (PERCOCET/ROXICET) 5-325 MG per tablet 2 tablet (2 tablets Oral Given 02/10/13 0800)    Discharge Medication List as of 02/10/2013  7:53 AM    START taking these medications   Details  naproxen (NAPROSYN) 500 MG tablet Take 1 tablet (500 mg total) by mouth 2 (two) times daily with a meal., Starting 02/10/2013, Until Discontinued, Print    oxyCODONE-acetaminophen (PERCOCET) 7.5-325 MG per tablet Take 1 tablet by mouth every 4 (four)  hours as needed for pain., Starting 02/10/2013, Until Discontinued, Print             I personally performed the services described in this documentation, which was scribed in my presence. The recorded information has been reviewed and is accurate.      Vida Roller, MD 02/10/13 [phone]

## 2013-02-12 ENCOUNTER — Ambulatory Visit (HOSPITAL_COMMUNITY)
Admission: RE | Admit: 2013-02-12 | Discharge: 2013-02-12 | Disposition: A | Discharge status: Home / Self Care | Payer: Medicare Other | Source: Ambulatory Visit | Attending: Emergency Medicine | Admitting: Emergency Medicine

## 2013-02-12 ENCOUNTER — Ambulatory Visit (HOSPITAL_COMMUNITY)
Admit: 2013-02-12 | Discharge: 2013-02-12 | Disposition: A | Discharge status: Home / Self Care | Payer: Medicare Other | Source: Ambulatory Visit | Attending: Emergency Medicine | Admitting: Emergency Medicine

## 2013-02-12 ENCOUNTER — Other Ambulatory Visit (HOSPITAL_COMMUNITY): Payer: Self-pay | Admitting: Emergency Medicine

## 2013-02-12 DIAGNOSIS — I714 Abdominal aortic aneurysm, without rupture, unspecified: Secondary | ICD-10-CM | POA: Insufficient documentation

## 2013-02-12 DIAGNOSIS — Z139 Encounter for screening, unspecified: Secondary | ICD-10-CM

## 2013-02-12 DIAGNOSIS — M545 Low back pain, unspecified: Secondary | ICD-10-CM | POA: Insufficient documentation

## 2013-02-12 DIAGNOSIS — R262 Difficulty in walking, not elsewhere classified: Secondary | ICD-10-CM | POA: Insufficient documentation

## 2013-03-12 ENCOUNTER — Ambulatory Visit: Payer: Medicare Other | Admitting: Physician Assistant

## 2013-03-12 ENCOUNTER — Encounter: Payer: Self-pay | Admitting: Physician Assistant

## 2013-03-12 ENCOUNTER — Ambulatory Visit (INDEPENDENT_AMBULATORY_CARE_PROVIDER_SITE_OTHER): Payer: Medicare Other | Admitting: Physician Assistant

## 2013-03-12 VITALS — BP 141/77 | HR 87 | Ht 69.0 in | Wt 185.0 lb

## 2013-03-12 DIAGNOSIS — I714 Abdominal aortic aneurysm, without rupture, unspecified: Secondary | ICD-10-CM | POA: Insufficient documentation

## 2013-03-12 DIAGNOSIS — E785 Hyperlipidemia, unspecified: Secondary | ICD-10-CM

## 2013-03-12 DIAGNOSIS — R918 Other nonspecific abnormal finding of lung field: Secondary | ICD-10-CM

## 2013-03-12 DIAGNOSIS — E041 Nontoxic single thyroid nodule: Secondary | ICD-10-CM

## 2013-03-12 DIAGNOSIS — I2510 Atherosclerotic heart disease of native coronary artery without angina pectoris: Secondary | ICD-10-CM

## 2013-03-12 NOTE — Progress Notes (Signed)
[address], [address] [place], [place]  [address] Phone: [phone] Fax:  [phone]  Date:  03/12/2013   ID:  Lucas Morgan, DOB 01/17/1941, MRN [id number]  PCP:  Alleen Borne  Cardiologist:  Dr.  Shawnie Pons => Dr. Tonny Bollman     History of Present Illness: Lucas Morgan is a 72 y.o. male who returns for f/u.  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 Echo 11/2008: EF 45%, mild MR, LAE.  Last LHC 12/2009 the setting of non-STEMI: Proximal LAD stent patent, ostial Dx 95-99%, and OM1 stent patent. PCI: Cutting Balloon angioplasty to the ostial diagonal. Patient has a history of being a non-responder Plavix. In 2011 he was switched to Effient.  Last seen by Dr. Riley Kill 03/2012.  At that time, the patient had a history of an abnormal chest x-ray and CT. He apparently had a lung nodule. Followup CT was never performed prior to his visit with Dr. Riley Kill. Chest CT was arranged and performed 04/11/12 and demonstrated extensive chronic lung disease with biapical bleb formation, peripheral fibrosis and bronchiectasis; ill-defined dependent nodular densities at both lung bases; a few prominent mediastinal lymph nodes; aortic and coronary artery atherosclerosis; left thyroid nodule. Previous scan was obtained and an addendum was made to the report dated 04/14/12. Most of the mediastinal and hilar lymph nodes appeared unchanged when compared to a prior study. Findings were all felt to be likely postinflammatory. Pulmonary findings were similar to prior studies and most likely postinflammatory. Mediastinal adenopathy was likely felt to be reactive. Plan was to arrange referral to pulmonology and also to schedule a thyroid ultrasound. Patient missed several points with Dr. Riley Kill after this visit.  No thyroid U/S was done.  Most recently he was seen in the ED with lumbar compression fx.   MRI  02/12/13:  IMPRESSION:  Most notable finding on the present examination is the progressive compression of the L4 vertebral body superimposed upon degenerative changes as detailed above.  Fusiform aneurysm of the abdominal aorta with focal posterior bulge at the L2-3 level and maximal transverse dimension of 3.1 cm.  He has apparently seen someone for his back who recommended he see cardiology for the AAA noted.  He has continued LBP with LLE radicular symptoms.  No plans for surgery at this time.  Patient denies chest pain, significant dyspnea, orthopnea, PND, edema.  No syncope.  He describes NYHA Class II symptoms.    Labs (10/11):  K 4.4, Cr 1.8, ALT 18, Hgb 15   Wt Readings from Last 3 Encounters:  03/12/13 185 lb (83.915 kg)  01/11/13 190 lb (86.183 kg)  04/05/12 196 lb 12.8 oz (89.268 kg)     Past Medical History  Diagnosis Date  . Diabetes mellitus   . Dyslipidemia   . Renal insufficiency   . MI (myocardial infarction) feb 27/10    Current Outpatient Prescriptions  Medication Sig Dispense Refill  . aspirin 81 MG tablet Take 81 mg by mouth daily.        Marland Kitchen glipiZIDE (GLUCOTROL) 10 MG tablet Take 10 mg by mouth 2 (two) times daily before a meal.        . HYDROcodone-acetaminophen (NORCO/VICODIN) 5-325 MG per tablet 1 or 2 po q4h prn pain  20 tablet  0  . insulin glargine (LANTUS) 100 UNIT/ML injection Inject 80 Units into the skin  at bedtime. 80 to 85 units at night      . methocarbamol (ROBAXIN) 500 MG tablet Take 1 tablet (500 mg total) by mouth 3 (three) times daily.  21 tablet  0  . oxyCODONE-acetaminophen (PERCOCET) 7.5-325 MG per tablet Take 1 tablet by mouth every 4 (four) hours as needed for pain.  20 tablet  0  . simvastatin (ZOCOR) 40 MG tablet Take 40 mg by mouth at bedtime.         No current facility-administered medications for this visit.    Allergies:   No Known Allergies  Social History:  The patient  reports that he has never smoked. He does not have any  smokeless tobacco history on file. He reports that he does not drink alcohol or use illicit drugs.   ROS:  Please see the history of present illness.   All other systems reviewed and negative.   PHYSICAL EXAM: VS:  BP 141/77  Pulse 87  Ht 5\' 9"  (1.753 m)  Wt 185 lb (83.915 kg)  BMI 27.31 kg/m2 Well nourished, well developed, in no acute distress HEENT: normal Neck: no JVD Cardiac:  normal S1, S2; RRR; no murmur Lungs:  clear to auscultation bilaterally, no wheezing, rhonchi or rales Abd: soft, nontender, no hepatomegaly, no pulsatile masses Ext: no edema MSK:  Neg SLR test bilaterally Skin: warm and dry Neuro:  CNs 2-12 intact, no focal abnormalities noted  EKG:  NSR, HR 87, LAFB, RBBB, no change from prior tracing.     ASSESSMENT AND PLAN:  1. CAD:  No angina.  Continue ASA and statin.  He no longer takes Toprol due to a reported hx of low BP.  2. Lung Nodules:  I had a long d/w the patient today regarding the importance of follow up on this.  I recommended referral to pulmonology.  He states he is more concerned about his back.  After a prolonged discussion, he agrees to f/u with CLAGGETT,ELIN, PA-C to arrange referral to pulmonology.   3. Thyroid Nodule:  As above, the patient is not interested in f/u of this at this time.  He agrees to see his PCP, CLAGGETT,ELIN, PA-C regarding further testing and follow up.  4. AAA:  Schedule f/u abdominal U/S.  He prefers this be done at [facility] which is closer to his home. 5. Hyperlipidemia:  Continue statin. 6. Disposition:  F/u with Dr. Tonny Bollman 3-4 mos.  Signed, Tereso Newcomer, PA-C  03/12/2013 12:16 PM

## 2013-03-12 NOTE — Patient Instructions (Signed)
PLEASE SCHEDULE TO HAVE AN ABDOMINAL AORTIC U/S DONE AT [facility] PER PT REQUEST; DX AAA  PLEASE FOLLOW UP WITH DR. Excell Seltzer IN 3-4 MONTHS   MAKE SURE TO FOLLOW UP WITH YOUR PRIMARY CARE PHYSICIAN ABOUT LUNG NODULES AND THYROID NODULE

## 2013-03-22 ENCOUNTER — Telehealth: Payer: Self-pay | Admitting: Physician Assistant

## 2013-03-22 ENCOUNTER — Encounter: Payer: Self-pay | Admitting: Physician Assistant

## 2013-03-22 ENCOUNTER — Telehealth: Payer: Self-pay

## 2013-03-22 NOTE — Telephone Encounter (Signed)
s/w pt's daughter Eunice Blase who has been notified about pt's Abdominal U/S  that was done @ [facility]. Per Bing Neighbors. PA very small AAA 3.3 cm, repeat Abdominal U/S in 1 yr. Daughter verbalized plan of care.

## 2013-03-22 NOTE — Telephone Encounter (Signed)
Abdominal U/S 7/14: mild aneurysmal dilatation 3x3 cm; cholelithiasis without evid of cholecystitis Very small AAA Repeat in 1 year. Tereso Newcomer, PA-C   03/22/2013 2:02 PM

## 2013-03-30 ENCOUNTER — Encounter: Payer: Self-pay | Admitting: Physician Assistant

## 2013-05-01 ENCOUNTER — Ambulatory Visit: Payer: Medicare Other | Admitting: Cardiology

## 2013-05-11 ENCOUNTER — Telehealth: Payer: Self-pay | Admitting: Cardiovascular Disease

## 2013-05-11 NOTE — Telephone Encounter (Signed)
New Problem  Pt does not want to disclose information.

## 2013-05-11 NOTE — Telephone Encounter (Signed)
I called the [place] primary care offices and they currently do not have any samples of Lantus.  I did find information about the pt assistance program and will mail this to the pt's home. I spoke with Kennon Rounds Pharm-D and she said the pt should contact PCP and see if Lantus can be changed to another form of insulin. I spoke with the pt's wife and made her aware of this information.

## 2013-05-11 NOTE — Telephone Encounter (Signed)
I spoke with the pt's wife and she said the pt has gone into the donut hole and his Lantus is $364/month.  She states that our office is the last place that might possibly to be able to help them.  They have contacted the PCP and health department and cannot get any assistance. I made her aware that we do not keep samples of this medication in our office and that I can try to get information about pt assistance program.

## 2013-06-20 ENCOUNTER — Encounter: Payer: Self-pay | Admitting: Cardiology

## 2013-07-04 ENCOUNTER — Ambulatory Visit: Payer: Medicare Other | Admitting: Cardiology

## 2013-07-30 ENCOUNTER — Ambulatory Visit: Payer: Medicare Other | Admitting: Cardiology

## 2013-09-13 HISTORY — PX: BACK SURGERY: SHX140

## 2013-09-24 ENCOUNTER — Other Ambulatory Visit (HOSPITAL_COMMUNITY): Payer: Self-pay | Admitting: Orthopedic Surgery

## 2013-09-24 ENCOUNTER — Encounter: Payer: Self-pay | Admitting: Cardiology

## 2013-09-24 ENCOUNTER — Ambulatory Visit (INDEPENDENT_AMBULATORY_CARE_PROVIDER_SITE_OTHER): Payer: Medicare Other | Admitting: Cardiology

## 2013-09-24 VITALS — BP 121/77 | HR 92 | Ht 68.0 in | Wt 174.0 lb

## 2013-09-24 DIAGNOSIS — M545 Low back pain, unspecified: Secondary | ICD-10-CM

## 2013-09-24 DIAGNOSIS — I2510 Atherosclerotic heart disease of native coronary artery without angina pectoris: Secondary | ICD-10-CM

## 2013-09-24 DIAGNOSIS — E785 Hyperlipidemia, unspecified: Secondary | ICD-10-CM

## 2013-09-24 DIAGNOSIS — I714 Abdominal aortic aneurysm, without rupture, unspecified: Secondary | ICD-10-CM

## 2013-09-24 NOTE — Patient Instructions (Signed)
Your physician recommends that you schedule a follow-up appointment in: 1 year with Dr. Branch. You should receive a letter in the mail in 10 months. If you do not receive this letter by November 2015 call our office to schedule this appointment.   Your physician recommends that you continue on your current medications as directed. Please refer to the Current Medication list given to you today.  

## 2013-09-24 NOTE — Progress Notes (Signed)
Clinical Summary Lucas Morgan is a 73 y.o.male former patient of Dr Riley Kill, last seen by PA Alben Spittle. This is our first visit together.    1. CAD - prior inferior MI 10/2008 with DES to LCX and staged PCI of the LAD with a DES. LVEF reported at 45% by clinic notes, do not see actual echo report in our system - last cath 12/2009 with patent stents, severe diagonal disease that was opened with cutting balloon angioplasty.  - reported non-responder to plavix, previously managed with effient. Reported low blood pressures on metoprolol, has been stopped. Limited by low bp  - no recent chest pain. No SOB or DOE. Fairly sedentary lifestyle after recent back injury. - compliant with meds  2. Hyperlipidemia - followed by PCP  3. Abdominal aortic aneurysm - incidental finding on lumbar MRI 02/2013 - 03/2013 AAA ultrasoudn showed 3x 3 cm dilatation, mild   Past Medical History  Diagnosis Date  . Diabetes mellitus   . Dyslipidemia   . Renal insufficiency   . MI (myocardial infarction) feb 27/10  . AAA (abdominal aortic aneurysm)     a. Abd U/S 7/14: mild aneurysmal dilatation 3x3 cm; cholelithiasis without evid of cholecystitis => repeat 1 year     No Known Allergies   Current Outpatient Prescriptions  Medication Sig Dispense Refill  . aspirin 81 MG tablet Take 81 mg by mouth daily.        Marland Kitchen glipiZIDE (GLUCOTROL) 10 MG tablet Take 10 mg by mouth 2 (two) times daily before a meal.        . HYDROcodone-acetaminophen (NORCO/VICODIN) 5-325 MG per tablet 1 or 2 po q4h prn pain  20 tablet  0  . insulin glargine (LANTUS) 100 UNIT/ML injection Inject 80 Units into the skin at bedtime. 80 to 85 units at night      . methocarbamol (ROBAXIN) 500 MG tablet Take 1 tablet (500 mg total) by mouth 3 (three) times daily.  21 tablet  0  . oxyCODONE-acetaminophen (PERCOCET) 7.5-325 MG per tablet Take 1 tablet by mouth every 4 (four) hours as needed for pain.  20 tablet  0  . simvastatin (ZOCOR) 40 MG  tablet Take 40 mg by mouth at bedtime.         No current facility-administered medications for this visit.     Past Surgical History  Procedure Laterality Date  . Knee surgery    . Coronary stent placement       No Known Allergies    Family History  Problem Relation Age of Onset  . Heart attack Brother     x2 brothers     Social History Mr. Cranshaw reports that he has never smoked. He does not have any smokeless tobacco history on file. Mr. Rossin reports that he does not drink alcohol.   Review of Systems CONSTITUTIONAL: No weight loss, fever, chills, weakness or fatigue.  HEENT: Eyes: No visual loss, blurred vision, double vision or yellow sclerae.No hearing loss, sneezing, congestion, runny nose or sore throat.  SKIN: No rash or itching.  CARDIOVASCULAR: per HPI RESPIRATORY: No shortness of breath, cough or sputum.  GASTROINTESTINAL: No anorexia, nausea, vomiting or diarrhea. No abdominal pain or blood.  GENITOURINARY: No burning on urination, no polyuria NEUROLOGICAL: No headache, dizziness, syncope, paralysis, ataxia, numbness or tingling in the extremities. No change in bowel or bladder control.  MUSCULOSKELETAL: low back pain LYMPHATICS: No enlarged nodes. No history of splenectomy.  PSYCHIATRIC: No history of depression or  anxiety.  ENDOCRINOLOGIC: No reports of sweating, cold or heat intolerance. No polyuria or polydipsia.  Marland Kitchen.   Physical Examination p 92 bp 121/77 Wt 174 lbs BMI 26 Gen: resting comfortably, no acute distress HEENT: no scleral icterus, pupils equal round and reactive, no palptable cervical adenopathy,  CV: RRR, no m/r/g, no JVD, no carotid bruits Resp: Clear to auscultation bilaterally GI: abdomen is soft, non-tender, non-distended, normal bowel sounds, no hepatosplenomegaly MSK: extremities are warm, no edema.  Skin: warm, no rash Neuro:  no focal deficits Psych: appropriate affect   Diagnostic Studies Cath 12/2009 PROCEDURAL  FINDINGS:  The right coronary artery is small and nondominant.  There is no significant obstructive disease.  Left mainstem:  The left main is widely patent.  There is no significant stenosis.  The left main divides into the LAD and left circumflex.  LAD:  The LAD is patent throughout its course.  There is a widely patent stent in the proximal LAD with no significant in-stent restenosis. There are luminal irregularities throughout the mid LAD without obstructive disease.  The first diagonal is a large Kripa Foskey with severe ostial narrowing.  There is TIMI III flow but the ostium of the diagonal appears hypodense with 95-99% stenosis.  Left circumflex:  The circumflex is patent.  The first OM Issa Luster has a patent stent and it is a large Shin Lamour.  The stent has no significant restenosis.  The AV groove circumflex courses down and has minor luminal irregularities but no significant stenosis is noted.  The left circumflex is dominant and supplies a small left PDA.  FINAL ASSESSMENT: 1. Severe ostial diagonal stenosis with successful cutting balloon     angioplasty. 2. Continued patency of the left anterior descending (coronary artery)     and obtuse marginal stents with otherwise nonobstructive coronary     artery disease.   Assessment and Plan  1. CAD - no current symptoms - continue risk factor modification and secondary prevention - consider ACE-I in setting of known CAD and diabetes  2. Hyperlipidemia - followed by PCP, no recent panel in our system - in setting of known CAD, consider changing to high dose statin (atorva 80 or crestor 20) based on most recent lipid guidelines  3. Abdominal aortic aneurysm - mild dilatation by recent US, continue to follow.    Follow up 1 year   Antoine PocheJonathan F. Maryn Freelove, M.D., F.A.C.C.

## 2013-09-26 ENCOUNTER — Ambulatory Visit (HOSPITAL_COMMUNITY)
Admission: RE | Admit: 2013-09-26 | Discharge: 2013-09-26 | Disposition: A | Discharge status: Home / Self Care | Payer: Medicare Other | Source: Ambulatory Visit | Attending: Orthopedic Surgery | Admitting: Orthopedic Surgery

## 2013-09-26 DIAGNOSIS — M5126 Other intervertebral disc displacement, lumbar region: Secondary | ICD-10-CM | POA: Insufficient documentation

## 2013-09-26 DIAGNOSIS — M8448XA Pathological fracture, other site, initial encounter for fracture: Secondary | ICD-10-CM | POA: Insufficient documentation

## 2013-09-26 DIAGNOSIS — M545 Low back pain, unspecified: Secondary | ICD-10-CM | POA: Insufficient documentation

## 2013-09-26 DIAGNOSIS — M48061 Spinal stenosis, lumbar region without neurogenic claudication: Secondary | ICD-10-CM | POA: Insufficient documentation

## 2014-04-29 ENCOUNTER — Other Ambulatory Visit: Payer: Self-pay | Admitting: Neurosurgery

## 2014-04-29 DIAGNOSIS — M48061 Spinal stenosis, lumbar region without neurogenic claudication: Secondary | ICD-10-CM

## 2014-04-30 ENCOUNTER — Ambulatory Visit
Admission: RE | Admit: 2014-04-30 | Discharge: 2014-04-30 | Disposition: A | Discharge status: Home / Self Care | Payer: Medicare Other | Source: Ambulatory Visit | Attending: Neurosurgery | Admitting: Neurosurgery

## 2014-04-30 VITALS — BP 113/60 | HR 76

## 2014-04-30 DIAGNOSIS — M48061 Spinal stenosis, lumbar region without neurogenic claudication: Secondary | ICD-10-CM

## 2014-04-30 MED ORDER — IOHEXOL 180 MG/ML  SOLN
15.0000 mL | Freq: Once | INTRAMUSCULAR | Status: AC | PRN
  Administered 2014-04-30: 15 mL via INTRATHECAL

## 2014-04-30 MED ORDER — DIAZEPAM 5 MG PO TABS
5.0000 mg | ORAL_TABLET | Freq: Once | ORAL | Status: AC
  Administered 2014-04-30: 5 mg via ORAL

## 2014-04-30 NOTE — Discharge Instructions (Signed)

## 2014-06-10 ENCOUNTER — Encounter: Payer: Self-pay | Admitting: Cardiology

## 2014-06-11 ENCOUNTER — Encounter: Payer: Self-pay | Admitting: Cardiology

## 2014-09-19 ENCOUNTER — Other Ambulatory Visit: Payer: Self-pay | Admitting: Neurosurgery

## 2014-09-23 ENCOUNTER — Encounter: Payer: Self-pay | Admitting: Cardiology

## 2014-09-25 ENCOUNTER — Encounter: Payer: Self-pay | Admitting: *Deleted

## 2014-09-25 ENCOUNTER — Ambulatory Visit (INDEPENDENT_AMBULATORY_CARE_PROVIDER_SITE_OTHER): Payer: Medicare Other | Admitting: Cardiology

## 2014-09-25 ENCOUNTER — Encounter: Payer: Self-pay | Admitting: Cardiology

## 2014-09-25 VITALS — BP 128/80 | HR 85 | Ht 69.0 in | Wt 178.0 lb

## 2014-09-25 DIAGNOSIS — Z01810 Encounter for preprocedural cardiovascular examination: Secondary | ICD-10-CM

## 2014-09-25 DIAGNOSIS — I714 Abdominal aortic aneurysm, without rupture, unspecified: Secondary | ICD-10-CM

## 2014-09-25 DIAGNOSIS — E785 Hyperlipidemia, unspecified: Secondary | ICD-10-CM

## 2014-09-25 DIAGNOSIS — I25700 Atherosclerosis of coronary artery bypass graft(s), unspecified, with unstable angina pectoris: Secondary | ICD-10-CM

## 2014-09-25 NOTE — Patient Instructions (Signed)
Your physician wants you to follow-up in: 1 year with Dr. Lurena JoinerBranch You will receive a reminder letter in the mail two months in advance. If you don't receive a letter, please call our office to schedule the follow-up appointment.  Your physician recommends that you continue on your current medications as directed. Please refer to the Current Medication list given to you today.  Your physician has requested that you have a lexiscan myoview. For further information please visit [email or web address]. Please follow instruction sheet, as given.  AAA US  [facility]!!

## 2014-09-25 NOTE — Progress Notes (Addendum)
Clinical Summary [name] is a 74 y.o.male seen today for follow up of the following medical problems.   1. CAD - prior inferior MI 10/2008 with DES to LCX and staged PCI of the LAD with a DES.  - last cath 12/2009 with patent stents, severe diagonal disease that was opened with cutting balloon angioplasty. - echo 05/2014 LVEF 50-55%, abnormal diastolic function, multiple WMAs.  - reported non-responder to plavix, previously managed with effient. Reported low blood pressures on metoprolol, has been stopped.   - no recent chest pain since last visit. No SOB or DOE.  - compliant with meds  2. Hyperlipidemia - followed by PCP  3. Abdominal aortic aneurysm - incidental finding on lumbar MRI 02/2013 - 03/2013 AAA ultrasound showed 3x 3 cm dilatation, mild  4. Preoperative cardiac evaluation - patient being considered for lower back surgery   Past Medical History  Diagnosis Date  . Diabetes mellitus   . Dyslipidemia   . Renal insufficiency   . MI (myocardial infarction) feb 27/10  . AAA (abdominal aortic aneurysm)     a. Abd U/S 7/14: mild aneurysmal dilatation 3x3 cm; cholelithiasis without evid of cholecystitis => repeat 1 year     No Known Allergies   Current Outpatient Prescriptions  Medication Sig Dispense Refill  . aspirin 81 MG tablet Take 81 mg by mouth daily.      Marland Kitchen. glipiZIDE (GLUCOTROL) 10 MG tablet Take 10 mg by mouth 2 (two) times daily before a meal.      . insulin glargine (LANTUS) 100 UNIT/ML injection Inject 50 Units into the skin at bedtime.     . simvastatin (ZOCOR) 40 MG tablet Take 40 mg by mouth at bedtime.       No current facility-administered medications for this visit.     Past Surgical History  Procedure Laterality Date  . Knee surgery    . Coronary stent placement       No Known Allergies    Family History  Problem Relation Age of Onset  . Heart attack Brother     x2 brothers     Social History [name] reports that he  quit smoking about 21 years ago. He does not have any smokeless tobacco history on file. [name] reports that he does not drink alcohol.   Review of Systems CONSTITUTIONAL: No weight loss, fever, chills, weakness or fatigue.  HEENT: Eyes: No visual loss, blurred vision, double vision or yellow sclerae.No hearing loss, sneezing, congestion, runny nose or sore throat.  SKIN: No rash or itching.  CARDIOVASCULAR:  RESPIRATORY: No shortness of breath, cough or sputum.  GASTROINTESTINAL: No anorexia, nausea, vomiting or diarrhea. No abdominal pain or blood.  GENITOURINARY: No burning on urination, no polyuria NEUROLOGICAL: No headache, dizziness, syncope, paralysis, ataxia, numbness or tingling in the extremities. No change in bowel or bladder control.  MUSCULOSKELETAL: + back pain LYMPHATICS: No enlarged nodes. No history of splenectomy.  PSYCHIATRIC: No history of depression or anxiety.  ENDOCRINOLOGIC: No reports of sweating, cold or heat intolerance. No polyuria or polydipsia.  Marland Kitchen.   Physical Examination p 85 bp 128/80 Wt 178 lbs BMI 26 Gen: resting comfortably, no acute distress HEENT: no scleral icterus, pupils equal round and reactive, no palptable cervical adenopathy,  CV: RRR, no m/r/g, no JVD Resp: Clear to auscultation bilaterally GI: abdomen is soft, non-tender, non-distended, normal bowel sounds, no hepatosplenomegaly MSK: extremities are warm, no edema.  Skin: warm, no rash Neuro:  no  focal deficits Psych: appropriate affect   Diagnostic Studies Cath 12/2009 PROCEDURAL FINDINGS: The right coronary artery is small and nondominant. There is no significant obstructive disease.  Left mainstem: The left main is widely patent. There is no significant stenosis. The left main divides into the LAD and left circumflex.  LAD: The LAD is patent throughout its course. There is a widely patent stent in the proximal LAD with no significant in-stent restenosis. There are  luminal irregularities throughout the mid LAD without obstructive disease. The first diagonal is a large Murry Khiev with severe ostial narrowing. There is TIMI III flow but the ostium of the diagonal appears hypodense with 95-99% stenosis.  Left circumflex: The circumflex is patent. The first OM Atianna Haidar has a patent stent and it is a large Lovie Zarling. The stent has no significant restenosis. The AV groove circumflex courses down and has minor luminal irregularities but no significant stenosis is noted. The left circumflex is dominant and supplies a small left PDA.  FINAL ASSESSMENT: 1. Severe ostial diagonal stenosis with successful cutting balloon  angioplasty. 2. Continued patency of the left anterior descending (coronary artery)  and obtuse marginal stents with otherwise nonobstructive coronary  artery disease.  10/2009 MPI IMPRESSION: Large perfusion defect identified at the lateral and inferior walls of the left ventricle with evidence of significant reperfusion at the lateral wall portion of the defect on resting exam. Mildly decreased left ventricle ejection fraction of 43%. Hypokinesia of the lateral and inferior walls of the left ventricle.  05/2014 Echo LVEF 50-55%, abnormal diastolic function.   Assessment and Plan  1. CAD - no current symptoms - continue risk factor modification and secondary prevention   2. Hyperlipidemia - followed by PCP, no recent panel in our system - in setting of known CAD, consider changing to high dose statin (atorva 80 or crestor 20) based on most recent lipid guidelines  3. Abdominal aortic aneurysm - will repeat AAA [place]  4. Preoperative cardiac evaluation - patient being considered for intermediate risk lower back surgery. He has no active acute cardiac conditions. Unable to assess his exercise functional capacity by history, he is severely limited by lower back pain. Known hx of CAD with prior stending. Will obtain Lexiscan MPI  to better risk stratify prior to elective surgery   F/u 6 months      Antoine Poche, M.D    10/10/14 Addendum Stress test reviewed. No evidence of current ischemia, only prior scar. Mildly decreased LVEF by MPI however low normal by echo 05/2014. Recommend proceeding with surgery as planned. May hold ASA as needed.   Dominga Ferry MD

## 2014-09-26 ENCOUNTER — Inpatient Hospital Stay (HOSPITAL_COMMUNITY): Admission: RE | Admit: 2014-09-26 | Payer: Self-pay | Source: Ambulatory Visit

## 2014-09-27 ENCOUNTER — Inpatient Hospital Stay (HOSPITAL_COMMUNITY): Admission: RE | Admit: 2014-09-27 | Payer: Medicare Other | Source: Ambulatory Visit | Admitting: Neurosurgery

## 2014-09-27 ENCOUNTER — Encounter (HOSPITAL_COMMUNITY): Admission: RE | Payer: Self-pay | Source: Ambulatory Visit

## 2014-09-27 SURGERY — LUMBAR LAMINECTOMY/DECOMPRESSION MICRODISCECTOMY 1 LEVEL
Anesthesia: General | Site: Back

## 2014-10-03 ENCOUNTER — Encounter (INDEPENDENT_AMBULATORY_CARE_PROVIDER_SITE_OTHER): Payer: Medicare Other

## 2014-10-03 ENCOUNTER — Telehealth: Payer: Self-pay | Admitting: *Deleted

## 2014-10-03 DIAGNOSIS — I714 Abdominal aortic aneurysm, without rupture, unspecified: Secondary | ICD-10-CM

## 2014-10-03 NOTE — Telephone Encounter (Signed)
Pt wife made aware, forwarded to Dr. Donnie Mesalaggett

## 2014-10-03 NOTE — Telephone Encounter (Signed)
-----   Message from Antoine PocheJonathan F Branch, MD sent at 10/03/2014 11:47 AM EST ----- Small stable in size abdominal aneurysm, unchanged from prior study. We will continue to monitor, nothing else to do at this time  Dominga FerryJ Branch MD

## 2014-10-04 ENCOUNTER — Encounter (HOSPITAL_COMMUNITY): Payer: Medicare Other

## 2014-10-04 ENCOUNTER — Inpatient Hospital Stay (HOSPITAL_COMMUNITY): Admission: RE | Admit: 2014-10-04 | Payer: Medicare Other | Source: Ambulatory Visit

## 2014-10-10 ENCOUNTER — Ambulatory Visit (HOSPITAL_COMMUNITY)
Admission: RE | Admit: 2014-10-10 | Discharge: 2014-10-10 | Disposition: A | Discharge status: Home / Self Care | Payer: Medicare Other | Source: Ambulatory Visit | Attending: Cardiology | Admitting: Cardiology

## 2014-10-10 ENCOUNTER — Encounter (HOSPITAL_COMMUNITY)
Admission: RE | Admit: 2014-10-10 | Discharge: 2014-10-10 | Disposition: A | Discharge status: Home / Self Care | Payer: Medicare Other | Source: Ambulatory Visit | Attending: Cardiology | Admitting: Cardiology

## 2014-10-10 ENCOUNTER — Encounter (HOSPITAL_COMMUNITY): Payer: Self-pay

## 2014-10-10 DIAGNOSIS — Z01818 Encounter for other preprocedural examination: Secondary | ICD-10-CM | POA: Diagnosis not present

## 2014-10-10 DIAGNOSIS — I2510 Atherosclerotic heart disease of native coronary artery without angina pectoris: Secondary | ICD-10-CM | POA: Diagnosis not present

## 2014-10-10 DIAGNOSIS — I25700 Atherosclerosis of coronary artery bypass graft(s), unspecified, with unstable angina pectoris: Secondary | ICD-10-CM | POA: Diagnosis not present

## 2014-10-10 MED ORDER — SODIUM CHLORIDE 0.9 % IJ SOLN
10.0000 mL | INTRAMUSCULAR | Status: DC | PRN
  Administered 2014-10-10: 10 mL via INTRAVENOUS
  Filled 2014-10-10: qty 10

## 2014-10-10 MED ORDER — TECHNETIUM TC 99M SESTAMIBI - CARDIOLITE
10.0000 | Freq: Once | INTRAVENOUS | Status: AC | PRN
  Administered 2014-10-10: 07:00:00 10 via INTRAVENOUS

## 2014-10-10 MED ORDER — REGADENOSON 0.4 MG/5ML IV SOLN
INTRAVENOUS | Status: AC
  Administered 2014-10-10: 0.4 mg via INTRAVENOUS
  Filled 2014-10-10: qty 5

## 2014-10-10 MED ORDER — TECHNETIUM TC 99M SESTAMIBI GENERIC - CARDIOLITE
30.0000 | Freq: Once | INTRAVENOUS | Status: AC | PRN
  Administered 2014-10-10: 30 via INTRAVENOUS

## 2014-10-10 MED ORDER — REGADENOSON 0.4 MG/5ML IV SOLN
0.4000 mg | Freq: Once | INTRAVENOUS | Status: AC | PRN
  Administered 2014-10-10: 0.4 mg via INTRAVENOUS

## 2014-10-10 MED ORDER — SODIUM CHLORIDE 0.9 % IJ SOLN
INTRAMUSCULAR | Status: AC
  Administered 2014-10-10: 10 mL via INTRAVENOUS
  Filled 2014-10-10: qty 3

## 2014-10-10 NOTE — Progress Notes (Signed)
Stress Lab Nurses Notes - Lucas Morgan  Lucas Morgan 10/10/2014 Reason for doing test: CAD and Surgical Clearance Type of test: Marlane HatcherLexiscan Cardiolite Nurse performing test: Lucas PoissonPhyllis Billingsly, RN Nuclear Medicine Tech: Lucas Morgan Echo Tech: Not Applicable MD performing test: Lucas Morgan Family MD: Lucas PattyElin Claggett PA-C Test explained and consent signed: Yes.   IV started: Saline lock flushed, No redness or edema and Saline lock started in radiology Symptoms: None Treatment/Intervention: None Reason test stopped: protocol completed After recovery IV was: Discontinued via X-ray tech and No redness or edema Patient to return to Nuc. Med at : 9:30 Patient discharged: Home Patient's Condition upon discharge was: stable Comments: During test BP 129/73 & HR 101.  Recovery BP 141/83 & HR 86 .  Symptoms resolved in recovery. Lucas Morgan, Lucas Morgan

## 2014-10-11 ENCOUNTER — Telehealth: Payer: Self-pay | Admitting: *Deleted

## 2014-10-11 NOTE — Telephone Encounter (Signed)
-----   Message from Jonathan F BrAntoine Pocheanch, MD sent at 10/10/2014  6:41 PM EST ----- Please let patient know stress test overall looks good. Only evidence of old blockages, no new ones. He is ok for planned surgery, please see my clinic note with the addendum I added to his surgeon.   Dominga FerryJ Branch MD

## 2014-10-11 NOTE — Telephone Encounter (Signed)
Pt made aware, forwarded to Dr. Jordan LikesPool and Dr. Donnie Mesalaggett

## 2014-10-15 ENCOUNTER — Encounter (HOSPITAL_COMMUNITY): Payer: Self-pay | Admitting: Vascular Surgery

## 2014-10-15 NOTE — Progress Notes (Signed)
Anesthesia Chart Review: Patient is a 74 year old male posted for one level lumbar laminectomy/decompression microdiscectomy on 10/18/14. I was just faxed cardiac clearance records this afternoon.  I don't see a PAT appointment scheduled as of yet.  History includes former smoker, DM2, CAD/posteroinferior MI s/p DES to LCX and staged DES LAD '10 and cutting balloon angioplasty to DIAG '11 (reported non-responder to Plavix and was previously managed on Effient), AAA, CKD, dyslipidemia. PCP is listed as Scientific laboratory technicianlin Claggett, PA-C. Cardiologist is Dr. Dina RichJonathan Branch.   10/10/14 Nuclear stress test: 1. Large inferior and inferoapical infarct with no peri-infarct ischemia. 2. The inferior wall was hypokinetic. 3. Left ventricular ejection fraction 44% 4. Intermediate-risk stress test findings*. Intermediate risk study based on borderline decreased left ventricular ejection fraction, recommend correlating with echocardiogram. There is no current myocardium at jeopardy.  Report reviewed by Dr. Wyline MoodBranch who wrote, "No evidence of current ischemia, only prior scar. Mildly decreased LVEF by MPI however low normal by echo 05/2014. Recommend proceeding with surgery as planned. May hold ASA as needed."  06/11/14 Echo [facility]([place]): Mild concentric LVH. Global LV wall motion and contractility are WNL. Estimated LVEF 50-55%. Abnormal LV diastolic function. The basal anterolateral, mid anterolateral, and mid inferolateral wall segments are hypokinetic (score 2). Mild AV sclerosis. Trace MR.  12/22/09 Cardiac cath: 1. The right coronary artery is small and nondominant. There is no significant obstructive disease. 2. Left mainstem: The left main is widely patent. There is no significant stenosis. The left main divides into the LAD and left circumflex. 3. LAD: The LAD is patent throughout its course. There is a widely patent stent in the proximal LAD with no significant in-stent restenosis. There are luminal irregularities  throughout the mid LAD without obstructive disease. The first diagonal is a large branch with severe ostial narrowing. There is TIMI III flow but the ostium of the diagonal appears hypodense with 95-99% stenosis. 4. Left circumflex: The circumflex is patent. The first OM branch has a patent stent and it is a large branch. The stent has no significant restenosis. The AV groove circumflex courses down and has minor luminal irregularities but no significant stenosis is noted. The left circumflex is dominant and supplies a small left PDA. FINAL ASSESSMENT: 1. Severe ostial diagonal stenosis with successful cutting balloon angioplasty. 2. Continued patency of the left anterior descending (coronary artery) and obtuse marginal stents with otherwise nonobstructive coronary artery disease.  06/10/14 EKG: SR, probable LAE, right BBB, LAD, consider LAFB. He had findings for right BBB, LAD, consider inferior infarct on prior EKG from 06/20/13.  10/03/14 abdominal aorta duplex: Stable dimensions of small, infrarenal fusiform AAA at 2.9 cm X 2.9 cm.  Normal caliber common and EIA bilaterally.  IVC is patent.  Aorta-iliac atherosclerosis, without stenosis.  F/U in one year.   Preoperative labs are pending his PAT visit.  Hopefully we can still get him an appointment, otherwise labs will be done on the day of surgery if not done elsewhere in the past two weeks.  Velna Ochsllison Benedetto Ryder, PA-C [facility]esthesiology Phone [phone] 10/15/2014 5:14 PM

## 2014-10-17 ENCOUNTER — Encounter (HOSPITAL_COMMUNITY): Payer: Self-pay

## 2014-10-17 MED ORDER — DEXAMETHASONE SODIUM PHOSPHATE 10 MG/ML IJ SOLN
10.0000 mg | INTRAMUSCULAR | Status: AC
  Administered 2014-10-18: 10 mg via INTRAVENOUS
  Filled 2014-10-17: qty 1

## 2014-10-17 MED ORDER — MUPIROCIN 2 % EX OINT
1.0000 "application " | TOPICAL_OINTMENT | Freq: Once | CUTANEOUS | Status: AC
  Administered 2014-10-18: 1 via TOPICAL
  Filled 2014-10-17: qty 22

## 2014-10-17 MED ORDER — CEFAZOLIN SODIUM-DEXTROSE 2-3 GM-% IV SOLR
2.0000 g | INTRAVENOUS | Status: AC
  Administered 2014-10-18: 2 g via INTRAVENOUS
  Filled 2014-10-17: qty 50

## 2014-10-18 ENCOUNTER — Inpatient Hospital Stay (HOSPITAL_COMMUNITY): Payer: Medicare Other | Admitting: Vascular Surgery

## 2014-10-18 ENCOUNTER — Inpatient Hospital Stay (HOSPITAL_COMMUNITY): Payer: Medicare Other

## 2014-10-18 ENCOUNTER — Observation Stay (HOSPITAL_COMMUNITY): Payer: Medicare Other

## 2014-10-18 ENCOUNTER — Encounter (HOSPITAL_COMMUNITY): Payer: Self-pay | Admitting: Surgery

## 2014-10-18 ENCOUNTER — Encounter (HOSPITAL_COMMUNITY)
Admission: RE | Disposition: A | Discharge status: Home / Self Care | Payer: Self-pay | Source: Ambulatory Visit | Attending: Neurosurgery

## 2014-10-18 ENCOUNTER — Observation Stay (HOSPITAL_COMMUNITY)
Admission: RE | Admit: 2014-10-18 | Discharge: 2014-10-19 | Disposition: A | Discharge status: Home / Self Care | Payer: Medicare Other | Source: Ambulatory Visit | Attending: Neurosurgery | Admitting: Neurosurgery

## 2014-10-18 DIAGNOSIS — M1990 Unspecified osteoarthritis, unspecified site: Secondary | ICD-10-CM | POA: Insufficient documentation

## 2014-10-18 DIAGNOSIS — Z7982 Long term (current) use of aspirin: Secondary | ICD-10-CM | POA: Insufficient documentation

## 2014-10-18 DIAGNOSIS — E119 Type 2 diabetes mellitus without complications: Secondary | ICD-10-CM | POA: Insufficient documentation

## 2014-10-18 DIAGNOSIS — Z794 Long term (current) use of insulin: Secondary | ICD-10-CM | POA: Insufficient documentation

## 2014-10-18 DIAGNOSIS — M48061 Spinal stenosis, lumbar region without neurogenic claudication: Secondary | ICD-10-CM | POA: Diagnosis present

## 2014-10-18 DIAGNOSIS — M48062 Spinal stenosis, lumbar region with neurogenic claudication: Secondary | ICD-10-CM | POA: Diagnosis present

## 2014-10-18 DIAGNOSIS — E785 Hyperlipidemia, unspecified: Secondary | ICD-10-CM | POA: Insufficient documentation

## 2014-10-18 DIAGNOSIS — Z419 Encounter for procedure for purposes other than remedying health state, unspecified: Secondary | ICD-10-CM

## 2014-10-18 DIAGNOSIS — I2510 Atherosclerotic heart disease of native coronary artery without angina pectoris: Secondary | ICD-10-CM | POA: Diagnosis not present

## 2014-10-18 DIAGNOSIS — I213 ST elevation (STEMI) myocardial infarction of unspecified site: Secondary | ICD-10-CM | POA: Diagnosis not present

## 2014-10-18 DIAGNOSIS — J45909 Unspecified asthma, uncomplicated: Secondary | ICD-10-CM

## 2014-10-18 DIAGNOSIS — Z87891 Personal history of nicotine dependence: Secondary | ICD-10-CM | POA: Insufficient documentation

## 2014-10-18 DIAGNOSIS — M4806 Spinal stenosis, lumbar region: Principal | ICD-10-CM | POA: Insufficient documentation

## 2014-10-18 DIAGNOSIS — I714 Abdominal aortic aneurysm, without rupture: Secondary | ICD-10-CM | POA: Diagnosis not present

## 2014-10-18 HISTORY — DX: Unspecified osteoarthritis, unspecified site: M19.90

## 2014-10-18 HISTORY — DX: Atherosclerotic heart disease of native coronary artery without angina pectoris: I25.10

## 2014-10-18 HISTORY — PX: LUMBAR LAMINECTOMY/DECOMPRESSION MICRODISCECTOMY: SHX5026

## 2014-10-18 LAB — GLUCOSE, CAPILLARY
GLUCOSE-CAPILLARY: 108 mg/dL — AB (ref 70–99)
GLUCOSE-CAPILLARY: 129 mg/dL — AB (ref 70–99)
Glucose-Capillary: 165 mg/dL — ABNORMAL HIGH (ref 70–99)
Glucose-Capillary: 295 mg/dL — ABNORMAL HIGH (ref 70–99)
Glucose-Capillary: 361 mg/dL — ABNORMAL HIGH (ref 70–99)

## 2014-10-18 LAB — CBC WITH DIFFERENTIAL/PLATELET
Basophils Absolute: 0.1 10*3/uL (ref 0.0–0.1)
Basophils Relative: 1 % (ref 0–1)
EOS ABS: 0.2 10*3/uL (ref 0.0–0.7)
Eosinophils Relative: 3 % (ref 0–5)
HCT: 46.4 % (ref 39.0–52.0)
Hemoglobin: 16.6 g/dL (ref 13.0–17.0)
LYMPHS ABS: 1.8 10*3/uL (ref 0.7–4.0)
LYMPHS PCT: 23 % (ref 12–46)
MCH: 31.9 pg (ref 26.0–34.0)
MCHC: 35.8 g/dL (ref 30.0–36.0)
MCV: 89.1 fL (ref 78.0–100.0)
MONOS PCT: 12 % (ref 3–12)
Monocytes Absolute: 1 10*3/uL (ref 0.1–1.0)
NEUTROS ABS: 5.1 10*3/uL (ref 1.7–7.7)
NEUTROS PCT: 61 % (ref 43–77)
PLATELETS: 171 10*3/uL (ref 150–400)
RBC: 5.21 MIL/uL (ref 4.22–5.81)
RDW: 12.4 % (ref 11.5–15.5)
WBC: 8.1 10*3/uL (ref 4.0–10.5)

## 2014-10-18 LAB — BASIC METABOLIC PANEL
ANION GAP: 7 (ref 5–15)
BUN: 18 mg/dL (ref 6–23)
CO2: 26 mmol/L (ref 19–32)
CREATININE: 1.29 mg/dL (ref 0.50–1.35)
Calcium: 9 mg/dL (ref 8.4–10.5)
Chloride: 105 mmol/L (ref 96–112)
GFR calc non Af Amer: 53 mL/min — ABNORMAL LOW (ref >90)
GFR, EST AFRICAN AMERICAN: 62 mL/min — AB (ref >90)
GLUCOSE: 106 mg/dL — AB (ref 70–99)
Potassium: 4 mmol/L (ref 3.5–5.1)
SODIUM: 138 mmol/L (ref 135–145)

## 2014-10-18 LAB — SURGICAL PCR SCREEN
MRSA, PCR: NEGATIVE
Staphylococcus aureus: NEGATIVE

## 2014-10-18 SURGERY — LUMBAR LAMINECTOMY/DECOMPRESSION MICRODISCECTOMY 1 LEVEL
Anesthesia: General | Site: Back

## 2014-10-18 MED ORDER — MENTHOL 3 MG MT LOZG: 1.0000 | LOZENGE | OROMUCOSAL | Status: DC | PRN

## 2014-10-18 MED ORDER — NEOSTIGMINE METHYLSULFATE 10 MG/10ML IV SOLN
INTRAVENOUS | Status: DC | PRN
  Administered 2014-10-18: 3 mg via INTRAVENOUS

## 2014-10-18 MED ORDER — HYDROMORPHONE HCL 1 MG/ML IJ SOLN
INTRAMUSCULAR | Status: AC
  Filled 2014-10-18: qty 1

## 2014-10-18 MED ORDER — PROPOFOL 10 MG/ML IV BOLUS
INTRAVENOUS | Status: DC | PRN
  Administered 2014-10-18: 100 mg via INTRAVENOUS

## 2014-10-18 MED ORDER — GLYCOPYRROLATE 0.2 MG/ML IJ SOLN
INTRAMUSCULAR | Status: AC
  Filled 2014-10-18: qty 2

## 2014-10-18 MED ORDER — BUPIVACAINE HCL (PF) 0.25 % IJ SOLN
INTRAMUSCULAR | Status: DC | PRN
  Administered 2014-10-18: 20 mL

## 2014-10-18 MED ORDER — ARTIFICIAL TEARS OP OINT
TOPICAL_OINTMENT | OPHTHALMIC | Status: DC | PRN
  Administered 2014-10-18: 1 via OPHTHALMIC

## 2014-10-18 MED ORDER — LIDOCAINE HCL (CARDIAC) 20 MG/ML IV SOLN
INTRAVENOUS | Status: DC | PRN
  Administered 2014-10-18: 40 mg via INTRAVENOUS

## 2014-10-18 MED ORDER — ACETAMINOPHEN 650 MG RE SUPP: 650.0000 mg | RECTAL | Status: DC | PRN

## 2014-10-18 MED ORDER — HYDROMORPHONE HCL 1 MG/ML IJ SOLN
0.2500 mg | INTRAMUSCULAR | Status: DC | PRN
  Administered 2014-10-18 (×4): 0.5 mg via INTRAVENOUS

## 2014-10-18 MED ORDER — MIDAZOLAM HCL 2 MG/2ML IJ SOLN
INTRAMUSCULAR | Status: AC
  Filled 2014-10-18: qty 2

## 2014-10-18 MED ORDER — ONDANSETRON HCL 4 MG/2ML IJ SOLN
INTRAMUSCULAR | Status: DC | PRN
  Administered 2014-10-18: 4 mg via INTRAVENOUS

## 2014-10-18 MED ORDER — PROPOFOL 10 MG/ML IV BOLUS
INTRAVENOUS | Status: AC
  Filled 2014-10-18: qty 20

## 2014-10-18 MED ORDER — HEMOSTATIC AGENTS (NO CHARGE) OPTIME
TOPICAL | Status: DC | PRN
  Administered 2014-10-18: 1 via TOPICAL

## 2014-10-18 MED ORDER — SODIUM CHLORIDE 0.9 % IJ SOLN
3.0000 mL | INTRAMUSCULAR | Status: DC | PRN
Start: 2014-10-18 — End: 2014-10-19

## 2014-10-18 MED ORDER — INFLUENZA VAC SPLIT QUAD 0.5 ML IM SUSY
0.5000 mL | PREFILLED_SYRINGE | INTRAMUSCULAR | Status: DC
  Filled 2014-10-18: qty 0.5

## 2014-10-18 MED ORDER — ROCURONIUM BROMIDE 100 MG/10ML IV SOLN
INTRAVENOUS | Status: DC | PRN
  Administered 2014-10-18: 50 mg via INTRAVENOUS

## 2014-10-18 MED ORDER — ALUM & MAG HYDROXIDE-SIMETH 200-200-20 MG/5ML PO SUSP: 30.0000 mL | Freq: Four times a day (QID) | ORAL | Status: DC | PRN

## 2014-10-18 MED ORDER — LACTATED RINGERS IV SOLN
INTRAVENOUS | Status: DC | PRN
  Administered 2014-10-18: 09:00:00 via INTRAVENOUS

## 2014-10-18 MED ORDER — PHENYLEPHRINE HCL 10 MG/ML IJ SOLN
10.0000 mg | INTRAVENOUS | Status: DC | PRN
  Administered 2014-10-18: 40 ug/min via INTRAVENOUS

## 2014-10-18 MED ORDER — ACETAMINOPHEN 325 MG PO TABS: 650.0000 mg | ORAL_TABLET | ORAL | Status: DC | PRN

## 2014-10-18 MED ORDER — ALBUTEROL SULFATE HFA 108 (90 BASE) MCG/ACT IN AERS
INHALATION_SPRAY | RESPIRATORY_TRACT | Status: DC | PRN
  Administered 2014-10-18 (×4): 3 via RESPIRATORY_TRACT

## 2014-10-18 MED ORDER — SODIUM CHLORIDE 0.9 % IV SOLN: 250.0000 mL | INTRAVENOUS | Status: DC

## 2014-10-18 MED ORDER — EPHEDRINE SULFATE 50 MG/ML IJ SOLN
INTRAMUSCULAR | Status: AC
  Filled 2014-10-18: qty 1

## 2014-10-18 MED ORDER — SENNA 8.6 MG PO TABS
1.0000 | ORAL_TABLET | Freq: Two times a day (BID) | ORAL | Status: DC
  Administered 2014-10-18: 8.6 mg via ORAL
  Filled 2014-10-18 (×3): qty 1

## 2014-10-18 MED ORDER — HYDROMORPHONE HCL 1 MG/ML IJ SOLN: 0.5000 mg | INTRAMUSCULAR | Status: DC | PRN

## 2014-10-18 MED ORDER — ROCURONIUM BROMIDE 50 MG/5ML IV SOLN
INTRAVENOUS | Status: AC
  Filled 2014-10-18: qty 1

## 2014-10-18 MED ORDER — SODIUM CHLORIDE 0.9 % IJ SOLN
3.0000 mL | Freq: Two times a day (BID) | INTRAMUSCULAR | Status: DC
  Administered 2014-10-18: 3 mL via INTRAVENOUS

## 2014-10-18 MED ORDER — FENTANYL CITRATE 0.05 MG/ML IJ SOLN
INTRAMUSCULAR | Status: AC
  Filled 2014-10-18: qty 5

## 2014-10-18 MED ORDER — KETOROLAC TROMETHAMINE 30 MG/ML IJ SOLN
15.0000 mg | Freq: Four times a day (QID) | INTRAMUSCULAR | Status: DC
  Administered 2014-10-18 – 2014-10-19 (×3): 15 mg via INTRAVENOUS
  Filled 2014-10-18 (×6): qty 1

## 2014-10-18 MED ORDER — ONDANSETRON HCL 4 MG/2ML IJ SOLN: 4.0000 mg | INTRAMUSCULAR | Status: DC | PRN

## 2014-10-18 MED ORDER — MIDAZOLAM HCL 2 MG/2ML IJ SOLN: 0.5000 mg | Freq: Once | INTRAMUSCULAR | Status: DC | PRN

## 2014-10-18 MED ORDER — KETOROLAC TROMETHAMINE 15 MG/ML IJ SOLN
INTRAMUSCULAR | Status: DC | PRN
  Administered 2014-10-18: 15 mg via INTRAVENOUS

## 2014-10-18 MED ORDER — NEOSTIGMINE METHYLSULFATE 10 MG/10ML IV SOLN
INTRAVENOUS | Status: AC
  Filled 2014-10-18: qty 1

## 2014-10-18 MED ORDER — SUCCINYLCHOLINE CHLORIDE 20 MG/ML IJ SOLN
INTRAMUSCULAR | Status: AC
  Filled 2014-10-18: qty 1

## 2014-10-18 MED ORDER — CYCLOBENZAPRINE HCL 10 MG PO TABS
10.0000 mg | ORAL_TABLET | Freq: Three times a day (TID) | ORAL | Status: DC | PRN
  Administered 2014-10-18: 10 mg via ORAL

## 2014-10-18 MED ORDER — ONDANSETRON HCL 4 MG/2ML IJ SOLN
INTRAMUSCULAR | Status: AC
Start: 2014-10-18 — End: 2014-10-18
  Filled 2014-10-18: qty 2

## 2014-10-18 MED ORDER — MEPERIDINE HCL 25 MG/ML IJ SOLN: 6.2500 mg | INTRAMUSCULAR | Status: DC | PRN

## 2014-10-18 MED ORDER — PHENOL 1.4 % MT LIQD
1.0000 | OROMUCOSAL | Status: DC | PRN
Start: 2014-10-18 — End: 2014-10-19

## 2014-10-18 MED ORDER — SODIUM CHLORIDE 0.9 % IR SOLN
Status: DC | PRN
  Administered 2014-10-18: 09:00:00

## 2014-10-18 MED ORDER — OXYCODONE-ACETAMINOPHEN 5-325 MG PO TABS: 1.0000 | ORAL_TABLET | ORAL | Status: DC | PRN

## 2014-10-18 MED ORDER — FENTANYL CITRATE 0.05 MG/ML IJ SOLN
INTRAMUSCULAR | Status: DC | PRN
  Administered 2014-10-18: 250 ug via INTRAVENOUS

## 2014-10-18 MED ORDER — HYDROCODONE-ACETAMINOPHEN 10-325 MG PO TABS
1.0000 | ORAL_TABLET | Freq: Four times a day (QID) | ORAL | Status: DC | PRN
  Administered 2014-10-18: 1 via ORAL
  Filled 2014-10-18: qty 1

## 2014-10-18 MED ORDER — LACTATED RINGERS IV SOLN
INTRAVENOUS | Status: DC
  Administered 2014-10-18: 07:00:00 via INTRAVENOUS

## 2014-10-18 MED ORDER — CYCLOBENZAPRINE HCL 10 MG PO TABS
ORAL_TABLET | ORAL | Status: AC
  Filled 2014-10-18: qty 1

## 2014-10-18 MED ORDER — PROMETHAZINE HCL 25 MG/ML IJ SOLN: 6.2500 mg | INTRAMUSCULAR | Status: DC | PRN

## 2014-10-18 MED ORDER — THROMBIN 5000 UNITS EX SOLR
CUTANEOUS | Status: DC | PRN
  Administered 2014-10-18 (×2): 5000 [IU] via TOPICAL

## 2014-10-18 MED ORDER — INSULIN GLARGINE 100 UNIT/ML ~~LOC~~ SOLN
70.0000 [IU] | Freq: Every day | SUBCUTANEOUS | Status: DC
  Administered 2014-10-18: 70 [IU] via SUBCUTANEOUS
  Filled 2014-10-18 (×2): qty 0.7

## 2014-10-18 MED ORDER — ARTIFICIAL TEARS OP OINT
TOPICAL_OINTMENT | OPHTHALMIC | Status: AC
  Filled 2014-10-18: qty 3.5

## 2014-10-18 MED ORDER — 0.9 % SODIUM CHLORIDE (POUR BTL) OPTIME
TOPICAL | Status: DC | PRN
  Administered 2014-10-18: 1000 mL

## 2014-10-18 MED ORDER — CEFAZOLIN SODIUM 1-5 GM-% IV SOLN
1.0000 g | Freq: Three times a day (TID) | INTRAVENOUS | Status: AC
  Administered 2014-10-18 (×2): 1 g via INTRAVENOUS
  Filled 2014-10-18 (×2): qty 50

## 2014-10-18 MED ORDER — GLYCOPYRROLATE 0.2 MG/ML IJ SOLN
INTRAMUSCULAR | Status: DC | PRN
  Administered 2014-10-18: .4 mg via INTRAVENOUS

## 2014-10-18 MED ORDER — SODIUM CHLORIDE 0.9 % IJ SOLN
INTRAMUSCULAR | Status: AC
  Filled 2014-10-18: qty 10

## 2014-10-18 SURGICAL SUPPLY — 49 items
BAG DECANTER FOR FLEXI CONT (MISCELLANEOUS) ×3 IMPLANT
BENZOIN TINCTURE PRP APPL 2/3 (GAUZE/BANDAGES/DRESSINGS) ×3 IMPLANT
BLADE CLIPPER SURG (BLADE) IMPLANT
BRUSH SCRUB EZ PLAIN DRY (MISCELLANEOUS) ×3 IMPLANT
BUR CUTTER 7.0 ROUND (BURR) ×3 IMPLANT
CANISTER SUCT 3000ML (MISCELLANEOUS) ×3 IMPLANT
CLOSURE WOUND 1/2 X4 (GAUZE/BANDAGES/DRESSINGS) ×1
CONT SPEC 4OZ CLIKSEAL STRL BL (MISCELLANEOUS) ×3 IMPLANT
DECANTER SPIKE VIAL GLASS SM (MISCELLANEOUS) ×3 IMPLANT
DRAPE LAPAROTOMY 100X72X124 (DRAPES) ×3 IMPLANT
DRAPE MICROSCOPE LEICA (MISCELLANEOUS) ×3 IMPLANT
DRAPE POUCH INSTRU U-SHP 10X18 (DRAPES) ×3 IMPLANT
DRAPE PROXIMA HALF (DRAPES) ×3 IMPLANT
DRAPE SURG 17X23 STRL (DRAPES) ×6 IMPLANT
DRSG OPSITE POSTOP 3X4 (GAUZE/BANDAGES/DRESSINGS) ×3 IMPLANT
DURAPREP 26ML APPLICATOR (WOUND CARE) ×3 IMPLANT
ELECT REM PT RETURN 9FT ADLT (ELECTROSURGICAL) ×3
ELECTRODE REM PT RTRN 9FT ADLT (ELECTROSURGICAL) ×1 IMPLANT
GAUZE SPONGE 4X4 12PLY STRL (GAUZE/BANDAGES/DRESSINGS) ×3 IMPLANT
GAUZE SPONGE 4X4 16PLY XRAY LF (GAUZE/BANDAGES/DRESSINGS) IMPLANT
GLOVE ECLIPSE 7.5 STRL STRAW (GLOVE) ×12 IMPLANT
GLOVE ECLIPSE 9.0 STRL (GLOVE) ×3 IMPLANT
GLOVE EXAM NITRILE LRG STRL (GLOVE) IMPLANT
GLOVE EXAM NITRILE MD LF STRL (GLOVE) IMPLANT
GLOVE EXAM NITRILE XL STR (GLOVE) IMPLANT
GLOVE EXAM NITRILE XS STR PU (GLOVE) IMPLANT
GLOVE INDICATOR 7.5 STRL GRN (GLOVE) ×6 IMPLANT
GOWN STRL REUS W/ TWL LRG LVL3 (GOWN DISPOSABLE) IMPLANT
GOWN STRL REUS W/ TWL XL LVL3 (GOWN DISPOSABLE) ×3 IMPLANT
GOWN STRL REUS W/TWL 2XL LVL3 (GOWN DISPOSABLE) ×3 IMPLANT
GOWN STRL REUS W/TWL LRG LVL3 (GOWN DISPOSABLE)
GOWN STRL REUS W/TWL XL LVL3 (GOWN DISPOSABLE) ×6
KIT BASIN OR (CUSTOM PROCEDURE TRAY) ×3 IMPLANT
KIT ROOM TURNOVER OR (KITS) ×3 IMPLANT
LIQUID BAND (GAUZE/BANDAGES/DRESSINGS) ×3 IMPLANT
NEEDLE HYPO 22GX1.5 SAFETY (NEEDLE) ×3 IMPLANT
NEEDLE SPNL 22GX3.5 QUINCKE BK (NEEDLE) ×3 IMPLANT
NS IRRIG 1000ML POUR BTL (IV SOLUTION) ×3 IMPLANT
PACK LAMINECTOMY NEURO (CUSTOM PROCEDURE TRAY) ×3 IMPLANT
PAD ARMBOARD 7.5X6 YLW CONV (MISCELLANEOUS) ×9 IMPLANT
RUBBERBAND STERILE (MISCELLANEOUS) ×6 IMPLANT
SPONGE SURGIFOAM ABS GEL SZ50 (HEMOSTASIS) ×3 IMPLANT
STRIP CLOSURE SKIN 1/2X4 (GAUZE/BANDAGES/DRESSINGS) ×2 IMPLANT
SUT VIC AB 2-0 CT1 18 (SUTURE) ×3 IMPLANT
SUT VIC AB 3-0 SH 8-18 (SUTURE) ×3 IMPLANT
SYR 20ML ECCENTRIC (SYRINGE) ×3 IMPLANT
TOWEL OR 17X24 6PK STRL BLUE (TOWEL DISPOSABLE) ×3 IMPLANT
TOWEL OR 17X26 10 PK STRL BLUE (TOWEL DISPOSABLE) ×3 IMPLANT
WATER STERILE IRR 1000ML POUR (IV SOLUTION) ×3 IMPLANT

## 2014-10-18 NOTE — Brief Op Note (Signed)
10/18/2014  10:59 AM  PATIENT:  Lucas Morgan  74 y.o. male  PRE-OPERATIVE DIAGNOSIS:  stenosis  POST-OPERATIVE DIAGNOSIS:  stenosis  PROCEDURE:  Procedure(s): LUMBAR LAMINECTOMY/DECOMPRESSION MICRODISCECTOMYLUMBAR THREE-FOUR  (N/A)  SURGEON:  Surgeon(s) and Role:    * Temple PaciniHenry A Lavena Loretto, MD - Primary    * Barnett AbuHenry Elsner, MD - Assisting  PHYSICIAN ASSISTANT:   ASSISTANTS:    ANESTHESIA:   general  EBL:     BLOOD ADMINISTERED:none  DRAINS: none   LOCAL MEDICATIONS USED:  MARCAINE     SPECIMEN:  No Specimen  DISPOSITION OF SPECIMEN:  N/A  COUNTS:  YES  TOURNIQUET:  * No tourniquets in log *  DICTATION: .Dragon Dictation  PLAN OF CARE: Admit for overnight observation  PATIENT DISPOSITION:  PACU - hemodynamically stable.   Delay start of Pharmacological VTE agent (>24hrs) due to surgical blood loss or risk of bleeding: yes

## 2014-10-18 NOTE — Anesthesia Preprocedure Evaluation (Addendum)
Anesthesia Evaluation  Patient identified by MRN, date of birth, ID band Patient awake    Reviewed: Allergy & Precautions, NPO status , Patient's Chart, lab work & pertinent test results  History of Anesthesia Complications Negative for: history of anesthetic complications  Airway Mallampati: II  TM Distance: >3 FB Neck ROM: Full    Dental  (+) Edentulous Upper, Edentulous Lower   Pulmonary COPDformer smoker (quit '95),    + wheezing (improved with albuterol)      Cardiovascular - angina+ CAD, + Past MI, + Cardiac Stents and + Peripheral Vascular Disease (AAA 3x3cm) Rhythm:Regular Rate:Normal  1/16 Myoview: 1. Large inferior and inferoapical infarct with no peri-infarct Ischemia 2. The inferior wall was hypokinetic.3. Left ventricular ejection fraction 44% 4. Intermediate-risk stress test findings*.  06/11/14 Echo [facility]([place]): Mild concentric LVH. Global LV wall motion and contractility are WNL. Estimated LVEF 50-55%. Abnormal LV diastolic function. The basal anterolateral, mid anterolateral, and mid inferolateral wall segments are hypokinetic (score 2). Mild AV sclerosis. Trace MR.   Neuro/Psych Chronic back pain: narcotics negative neurological ROS     GI/Hepatic negative GI ROS, Neg liver ROS,   Endo/Other  diabetes (glu 108), Insulin Dependent  Renal/GU Renal InsufficiencyRenal disease (creat 1.29)     Musculoskeletal  (+) Arthritis -,   Abdominal   Peds  Hematology negative hematology ROS (+)   Anesthesia Other Findings   Reproductive/Obstetrics                          Anesthesia Physical Anesthesia Plan  ASA: III  Anesthesia Plan: General   Post-op Pain Management:    Induction: Intravenous  Airway Management Planned: Oral ETT  Additional Equipment:   Intra-op Plan:   Post-operative Plan: Extubation in OR  Informed Consent: I have reviewed the patients History and Physical,  chart, labs and discussed the procedure including the risks, benefits and alternatives for the proposed anesthesia with the patient or authorized representative who has indicated his/her understanding and acceptance.   Dental advisory given  Plan Discussed with: CRNA and Surgeon  Anesthesia Plan Comments: (Plan routine monitors, GETA Pre-treat with albuterol MDI)        Anesthesia Quick Evaluation

## 2014-10-18 NOTE — Op Note (Signed)
Date of procedure: 10/18/2014  Date of dictation: Same  Service: Neurosurgery  Preoperative diagnosis: L3-L4 stenosis with neurogenic claudication  Postoperative diagnosis: Same  Procedure Name: Bilateral L3-L4 decompressive laminotomies with bilateral L3 and L4 decompressive foraminotomies  Surgeon:Deija Buhrman A.Nguyen Butler, M.D.  Asst. Surgeon: Danielle DessElsner  Anesthesia: General  Indication: 74 year old male with progressive back and bilateral lower extremity pain. Patient status post previous L1 and L4 vertebral plasties. At L3-4 the patient has evidence of moderately severe stenosis. Patient has failed conservative management and presents now for decompressive surgery.  Operative note: After induction of anesthesia, patient position prone onto Wilson frame and a properly padded. Lumbar region prepped and draped. Incision made overlying L3-4. Dissection performed bilaterally. Retractor placed. X-ray taken. Level confirmed. Bilateral decompressive laminotomies performed using high-speed drill and Kerrison rongeurs to remove the inferior aspect lamina of L3 medial aspect the L3-4 facet joint and the superior rim of the L4 lamina. Ligament flavum was elevated and resected thecal fashion. Underlying thecal sac identified. Decompressive foraminotomies and performed on course exiting L3 and L4 nerve roots bilaterally. At this point a very thorough decompression been achieved. There was no evidence of injury to thecal sac or nerve roots. Wound was then irrigated out like solution. Gelfoam was placed topically for hemostasis. Microscope and retractor system were removed. Hemostasis muscles she will chart. Wounds and close in layers with Vicryl sutures. Steri-Strips triggers were applied. No apparent complications . Patient tolerated the procedure well. He returns to the recovery room postop.

## 2014-10-18 NOTE — Anesthesia Postprocedure Evaluation (Signed)
  Anesthesia Post-op Note  Patient: Lucas Morgan  Procedure(s) Performed: Procedure(s): LUMBAR LAMINECTOMY/DECOMPRESSION MICRODISCECTOMYLUMBAR THREE-FOUR  (N/A)  Patient Location: PACU  Anesthesia Type:General  Level of Consciousness: awake, alert , oriented and patient cooperative  Airway and Oxygen Therapy: Patient Spontanous Breathing  Post-op Pain: none  Post-op Assessment: Post-op Vital signs reviewed, Patient's Cardiovascular Status Stable, Respiratory Function Stable, Patent Airway, No signs of Nausea or vomiting and Pain level controlled  Post-op Vital Signs: Reviewed and stable  Last Vitals:  Filed Vitals:   10/18/14 1240  BP:   Pulse:   Temp: 36.2 C  Resp:     Complications: No apparent anesthesia complications

## 2014-10-18 NOTE — Transfer of Care (Signed)
Immediate Anesthesia Transfer of Care Note  Patient: Lucas Morgan  Procedure(s) Performed: Procedure(s): LUMBAR LAMINECTOMY/DECOMPRESSION MICRODISCECTOMYLUMBAR THREE-FOUR  (N/A)  Patient Location: PACU  Anesthesia Type:General  Level of Consciousness: awake, oriented and patient cooperative  Airway & Oxygen Therapy: Patient Spontanous Breathing and Patient connected to face mask oxygen  Post-op Assessment: Report given to RN, Post -op Vital signs reviewed and stable, Patient moving all extremities and Patient moving all extremities X 4  Post vital signs: Reviewed and stable  Last Vitals:  Filed Vitals:   10/18/14 0706  BP: 127/71  Pulse: 73  Temp: 36.5 C  Resp: 20    Complications: No apparent anesthesia complications

## 2014-10-18 NOTE — Anesthesia Procedure Notes (Signed)
Procedure Name: Intubation Date/Time: 10/18/2014 9:47 AM Performed by: Izora Gala Pre-anesthesia Checklist: Patient identified, Emergency Drugs available, Suction available and Patient being monitored Patient Re-evaluated:Patient Re-evaluated prior to inductionOxygen Delivery Method: Circle system utilized Preoxygenation: Pre-oxygenation with 100% oxygen Intubation Type: IV induction Ventilation: Mask ventilation without difficulty Laryngoscope Size: Miller and 3 Grade View: Grade I Tube type: Oral Tube size: 7.5 mm Number of attempts: 1 Airway Equipment and Method: Stylet,  LTA kit utilized and Bite block Placement Confirmation: ETT inserted through vocal cords under direct vision,  positive ETCO2 and breath sounds checked- equal and bilateral Secured at: 22 cm Tube secured with: Tape Dental Injury: Teeth and Oropharynx as per pre-operative assessment

## 2014-10-18 NOTE — H&P (Signed)
Lucas Morgan is an 74 y.o. male.   Chief Complaint: Bilateral leg pain HPI: 74 year old male status post L1 and L4 osteoporotic compression fracture status post vertebroplasty presents with progressive symptoms of back and lower extremity pain consistent with neurogenic claudication. Workup demonstrates evidence of significant stenosis at L3-4. Patient presents now for decompressive surgery at L3-4.  Past Medical History  Diagnosis Date  . Diabetes mellitus   . Dyslipidemia   . Renal insufficiency   . MI (myocardial infarction) feb 27/10  . AAA (abdominal aortic aneurysm)     a. Abd U/S 7/14: mild aneurysmal dilatation 3x3 cm; cholelithiasis without evid of cholecystitis => repeat 1 year  . Coronary artery disease   . Arthritis     stenosis, lumbar region    Past Surgical History  Procedure Laterality Date  . Knee surgery    . Coronary stent placement    . Coronary angioplasty    . Back surgery  2015    lumbar fusion  . Joint replacement Left   . 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 Heart attack Brother     x2 brothers   Social History:  reports that he quit smoking about 21 years ago. He has never used smokeless tobacco. He reports that he does not drink alcohol or use illicit drugs.  Allergies: No Known Allergies  Medications Prior to Admission  Medication Sig Dispense Refill  . aspirin 81 MG tablet Take 81 mg by mouth daily.      Marland Kitchen HYDROcodone-acetaminophen (NORCO) 10-325 MG per tablet Take 1 tablet by mouth every 6 (six) hours as needed (pain).   0  . insulin glargine (LANTUS) 100 UNIT/ML injection Inject 70 Units into the skin at bedtime.       Results for orders placed or performed during the hospital encounter of 10/18/14 (from the past 48 hour(s))  Glucose, capillary     Status: Abnormal   Collection Time: 10/18/14  7:05 AM  Result Value Ref Range   Glucose-Capillary 108 (H)  70 - 99 mg/dL  Basic metabolic panel     Status: Abnormal   Collection Time: 10/18/14  7:08 AM  Result Value Ref Range   Sodium 138 135 - 145 mmol/L   Potassium 4.0 3.5 - 5.1 mmol/L   Chloride 105 96 - 112 mmol/L   CO2 26 19 - 32 mmol/L   Glucose, Bld 106 (H) 70 - 99 mg/dL   BUN 18 6 - 23 mg/dL   Creatinine, Ser 1.29 0.50 - 1.35 mg/dL   Calcium 9.0 8.4 - 10.5 mg/dL   GFR calc non Af Amer 53 (L) >90 mL/min   GFR calc Af Amer 62 (L) >90 mL/min    Comment: (NOTE) The eGFR has been calculated using the CKD EPI equation. This calculation has not been validated in all clinical situations. eGFR's persistently <90 mL/min signify possible Chronic Kidney Disease.    Anion gap 7 5 - 15  CBC WITH DIFFERENTIAL     Status: None   Collection Time: 10/18/14  7:08 AM  Result Value Ref Range   WBC 8.1 4.0 - 10.5 K/uL   RBC 5.21 4.22 - 5.81 MIL/uL   Hemoglobin 16.6 13.0 - 17.0 g/dL   HCT 46.4 39.0 - 52.0 %   MCV 89.1 78.0 - 100.0 fL   MCH 31.9 26.0 - 34.0 pg  MCHC 35.8 30.0 - 36.0 g/dL   RDW 12.4 11.5 - 15.5 %   Platelets 171 150 - 400 K/uL   Neutrophils Relative % 61 43 - 77 %   Neutro Abs 5.1 1.7 - 7.7 K/uL   Lymphocytes Relative 23 12 - 46 %   Lymphs Abs 1.8 0.7 - 4.0 K/uL   Monocytes Relative 12 3 - 12 %   Monocytes Absolute 1.0 0.1 - 1.0 K/uL   Eosinophils Relative 3 0 - 5 %   Eosinophils Absolute 0.2 0.0 - 0.7 K/uL   Basophils Relative 1 0 - 1 %   Basophils Absolute 0.1 0.0 - 0.1 K/uL   No results found.  Review of Systems  All other systems reviewed and are negative.   Blood pressure 127/71, pulse 73, temperature 97.7 F (36.5 C), temperature source Oral, resp. rate 20, SpO2 98 %. Physical Exam  Constitutional: He is oriented to person, place, and time. He appears well-developed and well-nourished. No distress.  HENT:  Head: Normocephalic and atraumatic.  Right Ear: External ear normal.  Left Ear: External ear normal.  Nose: Nose normal.  Mouth/Throat: Oropharynx is  clear and moist.  Eyes: Conjunctivae and EOM are normal. Pupils are equal, round, and reactive to light.  Neck: Normal range of motion. Neck supple. No tracheal deviation present. No thyromegaly present.  Cardiovascular: Normal rate, regular rhythm, normal heart sounds and intact distal pulses.  Exam reveals no friction rub.   No murmur heard. Respiratory: Effort normal and breath sounds normal. No respiratory distress. He has no wheezes.  GI: Soft. Bowel sounds are normal. He exhibits no distension. There is no tenderness.  Musculoskeletal: Normal range of motion. He exhibits no edema or tenderness.  Neurological: He is alert and oriented to person, place, and time. He has normal reflexes. No cranial nerve deficit. Coordination normal.  Skin: Skin is warm and dry. No rash noted. He is not diaphoretic. No erythema. No pallor.  Psychiatric: He has a normal mood and affect. His behavior is normal. Judgment and thought content normal.     Assessment/Plan L3-4 stenosis with neurogenic claudication. Plan L3-4 bilateral decompressive laminotomies and foraminotomies. Risks and benefits been explained. Patient wishes to proceed.  Savon Cobbs A 10/18/2014, 8:37 AM

## 2014-10-19 DIAGNOSIS — M4806 Spinal stenosis, lumbar region: Secondary | ICD-10-CM | POA: Diagnosis not present

## 2014-10-19 LAB — GLUCOSE, CAPILLARY: GLUCOSE-CAPILLARY: 267 mg/dL — AB (ref 70–99)

## 2014-10-19 MED ORDER — HYDROCODONE-ACETAMINOPHEN 10-325 MG PO TABS: 1.0000 | ORAL_TABLET | Freq: Four times a day (QID) | ORAL | Status: DC | PRN

## 2014-10-19 NOTE — Progress Notes (Signed)
Pt doing well. Pt and daughter given D/C instructions with Rx, verbal understanding was provided. Pt's incision is covered with Honeycomb dressing and is clean and dry with no sign of infection. Pt's IV was removed prior to D/C. Pt D/C'd home via wheelchair @ 0915 per MD order. Pt is stable @ D/C and has no other needs at this time. Rema FendtAshley Zeniya Lapidus, RN

## 2014-10-19 NOTE — Discharge Summary (Signed)
Physician Discharge Summary  Patient ID: Lucas Morgan MRN: [id number] DOB/AGE: 73/09/1940 73 y.o.  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 rate 18, SpO2 96 %. Neurologic: Alert and oriented X 3, normal strength and tone. Normal symmetric reflexes. Normal coordination and gait Wound:CDI  Disposition: Home     Medication List    TAKE these medications        aspirin 81 MG tablet  Take 81 mg by mouth daily.     HYDROcodone-acetaminophen 10-325 MG per tablet  Commonly known as:  NORCO  Take 1 tablet by mouth every 6 (six) hours as needed (pain).     HYDROcodone-acetaminophen 10-325 MG per tablet  Commonly known as:  NORCO  Take 1 tablet by mouth every 6 (six) hours as needed (pain).     insulin glargine 100 UNIT/ML injection  Commonly known as:  LANTUS  Inject 70 Units into the skin at bedtime.         Signed: Dorian HeckleSTERN,Bodey Frizell D, MD 10/19/2014, 8:50 AM

## 2014-10-19 NOTE — Discharge Instructions (Signed)
Wound Care °Keep incision covered and dry for one week.  If you shower prior to then, cover incision with plastic wrap.  °You may remove outer bandage after one week and shower.  °Do not put any creams, lotions, or ointments on incision. °Leave steri-strips on neck.  They will fall off by themselves. °Activity °Walk each and every day, increasing distance each day. °No lifting greater than 5 lbs.  Avoid excessive neck motion. °No driving for 2 weeks; may ride as a passenger locally. °If provided with back brace, wear when out of bed.  It is not necessary to wear brace in bed. °Diet °Resume your normal diet.  °Return to Work °Will be discussed at you follow up appointment. °Call Your Doctor If Any of These Occur °Redness, drainage, or swelling at the wound.  °Temperature greater than 101 degrees. °Severe pain not relieved by pain medication. °Incision starts to come apart. °Follow Up Appt °Call today for appointment in 1-2 weeks ([phone]) or for problems.  If you have any hardware placed in your spine, you will need an x-ray before your appointment. °

## 2014-10-19 NOTE — Progress Notes (Signed)
UR completed 

## 2014-10-21 ENCOUNTER — Encounter (HOSPITAL_COMMUNITY): Payer: Self-pay | Admitting: Neurosurgery

## 2014-12-25 ENCOUNTER — Institutional Professional Consult (permissible substitution): Payer: Medicare Other | Admitting: Internal Medicine

## 2015-03-03 ENCOUNTER — Encounter (HOSPITAL_COMMUNITY): Payer: Self-pay | Admitting: *Deleted

## 2015-03-03 ENCOUNTER — Emergency Department (HOSPITAL_COMMUNITY): Payer: Medicare Other

## 2015-03-03 ENCOUNTER — Emergency Department (HOSPITAL_COMMUNITY)
Admission: EM | Admit: 2015-03-03 | Discharge: 2015-03-04 | Disposition: A | Discharge status: Home / Self Care | Payer: Medicare Other | Attending: Emergency Medicine | Admitting: Emergency Medicine

## 2015-03-03 DIAGNOSIS — R112 Nausea with vomiting, unspecified: Secondary | ICD-10-CM | POA: Diagnosis not present

## 2015-03-03 DIAGNOSIS — Z87891 Personal history of nicotine dependence: Secondary | ICD-10-CM | POA: Diagnosis not present

## 2015-03-03 DIAGNOSIS — I2510 Atherosclerotic heart disease of native coronary artery without angina pectoris: Secondary | ICD-10-CM | POA: Diagnosis not present

## 2015-03-03 DIAGNOSIS — T184XXA Foreign body in colon, initial encounter: Secondary | ICD-10-CM | POA: Insufficient documentation

## 2015-03-03 DIAGNOSIS — M545 Low back pain: Secondary | ICD-10-CM | POA: Diagnosis not present

## 2015-03-03 DIAGNOSIS — Y929 Unspecified place or not applicable: Secondary | ICD-10-CM | POA: Insufficient documentation

## 2015-03-03 DIAGNOSIS — Z794 Long term (current) use of insulin: Secondary | ICD-10-CM | POA: Insufficient documentation

## 2015-03-03 DIAGNOSIS — Z8619 Personal history of other infectious and parasitic diseases: Secondary | ICD-10-CM | POA: Diagnosis not present

## 2015-03-03 DIAGNOSIS — Z87448 Personal history of other diseases of urinary system: Secondary | ICD-10-CM | POA: Insufficient documentation

## 2015-03-03 DIAGNOSIS — G8929 Other chronic pain: Secondary | ICD-10-CM | POA: Diagnosis not present

## 2015-03-03 DIAGNOSIS — M549 Dorsalgia, unspecified: Secondary | ICD-10-CM | POA: Diagnosis present

## 2015-03-03 DIAGNOSIS — Z9861 Coronary angioplasty status: Secondary | ICD-10-CM | POA: Insufficient documentation

## 2015-03-03 DIAGNOSIS — E119 Type 2 diabetes mellitus without complications: Secondary | ICD-10-CM | POA: Insufficient documentation

## 2015-03-03 DIAGNOSIS — X58XXXA Exposure to other specified factors, initial encounter: Secondary | ICD-10-CM | POA: Insufficient documentation

## 2015-03-03 DIAGNOSIS — T189XXA Foreign body of alimentary tract, part unspecified, initial encounter: Secondary | ICD-10-CM

## 2015-03-03 DIAGNOSIS — Y939 Activity, unspecified: Secondary | ICD-10-CM | POA: Diagnosis not present

## 2015-03-03 DIAGNOSIS — Y999 Unspecified external cause status: Secondary | ICD-10-CM | POA: Insufficient documentation

## 2015-03-03 DIAGNOSIS — M47896 Other spondylosis, lumbar region: Secondary | ICD-10-CM | POA: Insufficient documentation

## 2015-03-03 DIAGNOSIS — Z7982 Long term (current) use of aspirin: Secondary | ICD-10-CM | POA: Diagnosis not present

## 2015-03-03 DIAGNOSIS — I252 Old myocardial infarction: Secondary | ICD-10-CM | POA: Diagnosis not present

## 2015-03-03 LAB — URINALYSIS, ROUTINE W REFLEX MICROSCOPIC
BILIRUBIN URINE: NEGATIVE
GLUCOSE, UA: 100 mg/dL — AB
HGB URINE DIPSTICK: NEGATIVE
KETONES UR: NEGATIVE mg/dL
LEUKOCYTES UA: NEGATIVE
Nitrite: NEGATIVE
PROTEIN: NEGATIVE mg/dL
Specific Gravity, Urine: 1.015 (ref 1.005–1.030)
Urobilinogen, UA: 0.2 mg/dL (ref 0.0–1.0)
pH: 7 (ref 5.0–8.0)

## 2015-03-03 LAB — BASIC METABOLIC PANEL
ANION GAP: 8 (ref 5–15)
BUN: 18 mg/dL (ref 6–20)
CHLORIDE: 102 mmol/L (ref 101–111)
CO2: 25 mmol/L (ref 22–32)
Calcium: 9 mg/dL (ref 8.9–10.3)
Creatinine, Ser: 1.22 mg/dL (ref 0.61–1.24)
GFR calc non Af Amer: 57 mL/min — ABNORMAL LOW (ref >60)
Glucose, Bld: 190 mg/dL — ABNORMAL HIGH (ref 65–99)
POTASSIUM: 3.8 mmol/L (ref 3.5–5.1)
SODIUM: 135 mmol/L (ref 135–145)

## 2015-03-03 LAB — CBC WITH DIFFERENTIAL/PLATELET
BASOS ABS: 0.1 10*3/uL (ref 0.0–0.1)
BASOS PCT: 1 % (ref 0–1)
Eosinophils Absolute: 0.3 10*3/uL (ref 0.0–0.7)
Eosinophils Relative: 3 % (ref 0–5)
HCT: 47.6 % (ref 39.0–52.0)
Hemoglobin: 16.8 g/dL (ref 13.0–17.0)
Lymphocytes Relative: 25 % (ref 12–46)
Lymphs Abs: 2.7 10*3/uL (ref 0.7–4.0)
MCH: 31.8 pg (ref 26.0–34.0)
MCHC: 35.3 g/dL (ref 30.0–36.0)
MCV: 90.2 fL (ref 78.0–100.0)
MONOS PCT: 8 % (ref 3–12)
Monocytes Absolute: 0.9 10*3/uL (ref 0.1–1.0)
NEUTROS PCT: 63 % (ref 43–77)
Neutro Abs: 6.8 10*3/uL (ref 1.7–7.7)
PLATELETS: 184 10*3/uL (ref 150–400)
RBC: 5.28 MIL/uL (ref 4.22–5.81)
RDW: 12.7 % (ref 11.5–15.5)
WBC: 10.7 10*3/uL — ABNORMAL HIGH (ref 4.0–10.5)

## 2015-03-03 LAB — TROPONIN I

## 2015-03-03 MED ORDER — HYDROCODONE-ACETAMINOPHEN 5-325 MG PO TABS
2.0000 | ORAL_TABLET | Freq: Once | ORAL | Status: AC
  Administered 2015-03-03: 2 via ORAL
  Filled 2015-03-03: qty 2

## 2015-03-03 MED ORDER — ONDANSETRON 4 MG PO TBDP
4.0000 mg | ORAL_TABLET | Freq: Once | ORAL | Status: AC
  Administered 2015-03-03: 4 mg via ORAL
  Filled 2015-03-03: qty 1

## 2015-03-03 NOTE — ED Notes (Signed)
Patient complaining of lower back pain for a few days, ear pain with balance deficits, and difficulty sleeping. Family at bedside at this time. No needs voiced.

## 2015-03-03 NOTE — ED Notes (Signed)
Back pain, nausea, vomiting,   Being treated for lyme disease,  Had a tick removed this week  From lt lat chest that is red.

## 2015-03-03 NOTE — ED Provider Notes (Signed)
CSN: [id number]     Arrival date & time 03/03/15  1803 History  This chart was scribed for Glynn Octave, MD by Evon Slack, ED Scribe. This patient was seen in room APA04/APA04 and the patient's care was started at 10:05 PM.     Chief Complaint  Patient presents with  . Back Pain   The history is provided by the patient. No language interpreter was used.   HPI Comments: Lucas Morgan is a 74 y.o. male who presents to the Emergency Department complaining of worsening back pain onset February 2016. Pt reports recently having back surgery in February and has had pain since but states that its recently worsening. Pt states that the pain is radiating both of his legs. Family states that his knees intermittently give out on him as well. Pt is prescribed Norco 3x daily for his back pain but states that he has not had any today due to the recent antibiotic he has been taking making him sick. Pt report HX of tick bite on left chest 6 days prior. Family states that he is having worsening redness to the area of the tick bite. Family states that he is being treated for lyme disease. She states he was prescribed doxycyline which is making him sick every time he takes it. Pt family reports nausea and vomiting whenever he takes the doxycycline.  Patient states he only has dry heaves. Pt denies fever, CP, SOB bowel/bladder incontinence or dizziness.      Past Medical History  Diagnosis Date  . Diabetes mellitus   . Dyslipidemia   . MI (myocardial infarction) feb 27/10  . AAA (abdominal aortic aneurysm)     a. Abd U/S 7/14: mild aneurysmal dilatation 3x3 cm; cholelithiasis without evid of cholecystitis => repeat 1 year  . Coronary artery disease   . Arthritis     stenosis, lumbar region  . Renal insufficiency    Past Surgical History  Procedure Laterality Date  . Knee surgery    . Coronary stent placement    . Back surgery  2015    lumbar fusion  . Joint replacement Left   . 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 Lumbar laminectomy/decompression microdiscectomy N/A 10/18/2014    Procedure: LUMBAR LAMINECTOMY/DECOMPRESSION MICRODISCECTOMYLUMBAR THREE-FOUR ;  Surgeon: Temple Pacini, MD;  Location: [facility];  Service: Neurosurgery;  Laterality: N/A;  . Coronary angioplasty     Family History  Problem Relation Age of Onset  . Heart attack Brother     x2 brothers   History  Substance Use Topics  . Smoking status: Former Smoker    Quit date: 09/13/1993  . Smokeless tobacco: Never Used  . Alcohol Use: No    Review of Systems  Constitutional: Negative for fever.  Respiratory: Negative for shortness of breath.   Cardiovascular: Negative for chest pain.  Gastrointestinal: Positive for nausea and vomiting. Negative for abdominal pain.  Musculoskeletal: Positive for back pain.  Neurological: Negative for dizziness.  All other systems reviewed and are negative.     Allergies  Review of patient's allergies indicates no known allergies.  Home Medications   Prior to Admission medications   Medication Sig Start Date End Date Taking? Authorizing Provider  ALPRAZolam Prudy Feeler) 0.5 MG tablet Take 0.5 mg by mouth 2 (two) times daily as needed. 02/21/15  Yes Historical Provider, MD  aspirin 81 MG tablet Take 81 mg by mouth daily.  Yes Historical Provider, MD  doxycycline (VIBRAMYCIN) 100 MG capsule Take 100 mg by mouth every 12 (twelve) hours. 21 day supply starting 02/19/15 02/19/15  Yes Historical Provider, MD  HYDROcodone-acetaminophen (NORCO) 10-325 MG per tablet Take 1 tablet by mouth every 6 (six) hours as needed (pain). 10/19/14  Yes Maeola Harman, MD  LANTUS SOLOSTAR 100 UNIT/ML Solostar Pen Inject 100 Units into the skin at bedtime. 02/26/15  Yes Historical Provider, MD  ondansetron (ZOFRAN) 4 MG tablet Take 1 tablet (4 mg total) by mouth every 6 (six) hours. 03/04/15   Glynn Octave, MD   BP 136/74 mmHg  Pulse 86  Temp(Src) 97.7 F (36.5 C)  (Oral)  Resp 18  Ht  (1.753 m)  Wt 180 lb (81.647 kg)  BMI 26.57 kg/m2  SpO2 95%   Physical Exam  Constitutional: He is oriented to person, place, and time. He appears well-developed and well-nourished. No distress.  HENT:  Head: Normocephalic and atraumatic.  Mouth/Throat: Oropharynx is clear and moist. No oropharyngeal exudate.  Eyes: Conjunctivae and EOM are normal. Pupils are equal, round, and reactive to light.  Neck: Normal range of motion. Neck supple.  No meningismus.  Cardiovascular: Normal rate, regular rhythm, normal heart sounds and intact distal pulses.   No murmur heard. Pulmonary/Chest: Effort normal and breath sounds normal. No respiratory distress.  Left chest wall 3 cm area of erythema at site of tick bite.    Abdominal: Soft. There is no tenderness. There is no rebound and no guarding.  Musculoskeletal: Normal range of motion. He exhibits tenderness. He exhibits no edema.  Right para lumbar spinal tenderness. 5/5 strength in bilateral lower extremities. Ankle plantar and dorsiflexion intact. Great toe extension intact bilaterally. +2 DP and PT pulses. +2 patellar reflexes bilaterally. Normal gait.   Neurological: He is alert and oriented to person, place, and time. No cranial nerve deficit. He exhibits normal muscle tone. Coordination normal.  No ataxia on finger to nose bilaterally. No pronator drift. 5/5 strength throughout. CN 2-12 intact. Negative Romberg. Equal grip strength. Sensation intact. Gait is normal.   Skin: Skin is warm.  Psychiatric: He has a normal mood and affect. His behavior is normal.  Nursing note and vitals reviewed.   ED Course  Procedures (including critical care time) DIAGNOSTIC STUDIES: Oxygen Saturation is 98% on RA, normal by my interpretation.    COORDINATION OF CARE: 10:19 PM-Discussed treatment plan with pt at bedside and pt agreed to plan.     Labs Review Labs Reviewed  CBC WITH DIFFERENTIAL/PLATELET - Abnormal;  Notable for the following:    WBC 10.7 (*)    All other components within normal limits  BASIC METABOLIC PANEL - Abnormal; Notable for the following:    Glucose, Bld 190 (*)    GFR calc non Af Amer 57 (*)    All other components within normal limits  URINALYSIS, ROUTINE W REFLEX MICROSCOPIC (NOT AT  [facility]) - Abnormal; Notable for the following:    Glucose, UA 100 (*)    All other components within normal limits  TROPONIN I    Imaging Review Ct Renal Stone Study  03/03/2015   CLINICAL DATA:  Acute onset of back pain, nausea and vomiting. Currently being treated for Lyme disease. Initial encounter.  EXAM: CT ABDOMEN AND PELVIS WITHOUT CONTRAST  TECHNIQUE: Multidetector CT imaging of the abdomen and pelvis was performed following the standard protocol without IV contrast.  COMPARISON:  CT of the lumbar spine performed 04/30/2014, and MRI of the  lumbar spine performed 09/26/2013  FINDINGS: Bilateral lower lobe bronchiectasis is noted, with underlying patchy bilateral airspace opacification. This is similar in appearance to 2014 and likely reflects chronic sequelae of prior infection. Mild coronary artery calcifications are seen.  The liver and spleen are unremarkable in appearance. A few stones are seen dependently within the gallbladder. The gallbladder is otherwise unremarkable. The pancreas and adrenal glands are unremarkable.  A 1.2 cm hypodensity is noted at the anterior aspect of the upper pole of the left kidney, likely reflecting a small cyst. Mild nonspecific perinephric stranding is noted bilaterally. The kidneys are otherwise unremarkable. There is no evidence of hydronephrosis. No renal or ureteral stones are seen.  No free fluid is identified. The small bowel is unremarkable in appearance. The stomach is within normal limits. No acute vascular abnormalities are seen.  There is mild aneurysmal dilatation of the infrarenal abdominal aorta, measuring up to 3.2 cm in AP dimension. Mild thrombus is  noted along the distal abdominal aorta, without significant luminal narrowing. Mild scattered calcification is seen along the abdominal aorta and branches. Aneurysmal dilatation resolves proximal to the aortic bifurcation.  The appendix is not well characterized; there is no evidence for appendicitis.  There is a high density thin triangular structure measuring 1.7 cm along the mid transverse colon. This may reflect a small ingested bone fragment. The colon is otherwise unremarkable in appearance.  The bladder is mildly distended and grossly unremarkable. The prostate is borderline normal in size. No inguinal lymphadenopathy is seen.  No acute osseous abnormalities are identified. Compression deformities at vertebral bodies L1 and L4 are relatively stable from prior studies, with associated changes of vertebroplasty at both levels.  IMPRESSION: 1. No acute abnormality seen to explain the patient's symptoms. 2. Chronic compression deformities at L1 and L4 are unchanged in appearance, with associated changes of vertebroplasty again noted at both levels. 3. High-density thin triangular structure measuring 1.7 cm along the mid transverse colon. This may reflect a small ingested bone fragment. The colon is otherwise unremarkable in appearance. This remains within the lumen of the colon. 4. Bilateral lower lobe bronchiectasis, with underlying patchy bilateral airspace opacification, similar in appearance to 2014 and likely reflecting chronic sequelae of prior infection. 5. Cholelithiasis; gallbladder otherwise unremarkable. 6. Mild coronary artery calcifications seen. 7. Mild aneurysmal dilatation of the infrarenal abdominal aorta to 3.2 cm in AP dimension. Mild thrombus along the distal abdominal aorta, without significant luminal narrowing. Mild scattered calcification along the abdominal aorta and its branches. Recommend followup by ultrasound in 3 years. This recommendation follows ACR consensus guidelines: White  Paper of the ACR Incidental Findings Committee II on Vascular Findings. J Am Coll Radiol 2013; 16:109-604 8. Small left renal cyst noted.   Electronically Signed   By: Roanna Raider M.D.   On: 03/03/2015 23:27     EKG Interpretation   Date/Time:  Monday March 03 2015 23:45:34 EDT Ventricular Rate:  84 PR Interval:  205 QRS Duration: 111 QT Interval:  389 QTC Calculation: 460 R Axis:   -43 Text Interpretation:  Sinus rhythm Probable left atrial enlargement  Incomplete RBBB and LAFB No significant change was found Confirmed by  Manus Gunning  MD, Sherod Cisse [phone]) on 03/04/2015 12:17:34 AM      MDM   Final diagnoses:  Back pain  FB GI (foreign body in gastrointestinal tract), initial encounter   acute on chronic back pain since February. No recent change. Did not take pain medicine today. No focal weakness, numbness  or tingling. No bowel or bladder incontinence.  Being treated with empiric doxycycline for tick bite he sustained last week. States is making him nauseated. Denies any chest pain or shortness of breath. UA negative  Imaging shows stable AAA. Stable compression deformities of lumbar spine. Incidental finding of foreign body in the transverse colon. Patient does not know what this is.  Dw/ Dr. Derrell Lolling who defers management to GI.  Discussed with Dr. Darrick Penna of GI who recommends x-ray in 2 days to assess progression. She will contact patient. Patient with no abdominal pain  Ambulatory in the ED and pain controlled with home pain meds.  No nausea or vomiting.  Follow up for Xray regarding colon FB>  Return to the ED with abdominal pain, fever, vomiting, or any other concerns.  I personally performed the services described in this documentation, which was scribed in my presence. The recorded information has been reviewed and is accurate.      Glynn Octave, MD 03/04/15 [id number]

## 2015-03-03 NOTE — ED Notes (Signed)
Patient ambulated with assist, tolerated well. 

## 2015-03-04 ENCOUNTER — Telehealth: Payer: Self-pay | Admitting: Gastroenterology

## 2015-03-04 LAB — CBG MONITORING, ED: Glucose-Capillary: 159 mg/dL — ABNORMAL HIGH (ref 65–99)

## 2015-03-04 MED ORDER — ONDANSETRON HCL 4 MG PO TABS: 4.0000 mg | ORAL_TABLET | Freq: Four times a day (QID) | ORAL | Status: DC

## 2015-03-04 NOTE — Telephone Encounter (Signed)
Pt scheduled for 03/07/15, pt aware and confirmed.

## 2015-03-04 NOTE — ED Notes (Signed)
Patient verbalizes understanding of discharge instructions, prescription medications, home care and follow up care. Patient ambulatory out of department at this time with family. 

## 2015-03-04 NOTE — Telephone Encounter (Signed)
CALLED PT. SPOKE TO DAUGHTER. EXPLAINED CT SHOWS STOOL INCLUDING A SMALL BONE IN HIS COLON. I PERSONALLY CT WITH DR. Tyron Russell.  PT RECENTLY MADE CHICKEN AND DUMPLINGS. REASSURED DAUGHTER IT SHOULD PASS WITHOUT ANY PROBLEMS.  DAUGHTER CONCERNED THAT THE MALE DR. IN ED WAS RUDE AND ABOUT THE WAIT IN THE ED. REFERRED TO PT RELATIONS.  SEE DR. BRANCH TO EVALUATE FOR CLAUDICATION/VASCULAR DISEASE. LAST SEEN JAN/FEB 2016 FOR AAA/CAD. SHE WILL CONTACT DR. BRANCH FOR AN APPT IN [place]. PH# FOR [facility] GIVEN TO DAUGHTER. FOLLOW UP WITH PAIN MANAGEMENT IN [place], [place].

## 2015-03-04 NOTE — Discharge Instructions (Signed)
Back Pain, Adult Take nausea medication as prescribed. Follow-up with Dr. Darrick Penna for an x-ray in 2 days. Return to the ED with worsening abdominal pain, vomiting, inability to eat or drink or any other concerns. Low back pain is very common. About 1 in 5 people have back pain.The cause of low back pain is rarely dangerous. The pain often gets better over time.About half of people with a sudden onset of back pain feel better in just 2 weeks. About 8 in 10 people feel better by 6 weeks.  CAUSES Some common causes of back pain include:  Strain of the muscles or ligaments supporting the spine.  Wear and tear (degeneration) of the spinal discs.  Arthritis.  Direct injury to the back. DIAGNOSIS Most of the time, the direct cause of low back pain is not known.However, back pain can be treated effectively even when the exact cause of the pain is unknown.Answering your caregiver's questions about your overall health and symptoms is one of the most accurate ways to make sure the cause of your pain is not dangerous. If your caregiver needs more information, he or she may order lab work or imaging tests (X-rays or MRIs).However, even if imaging tests show changes in your back, this usually does not require surgery. HOME CARE INSTRUCTIONS For many people, back pain returns.Since low back pain is rarely dangerous, it is often a condition that people can learn to [facility] their own.   Remain active. It is stressful on the back to sit or stand in one place. Do not sit, drive, or stand in one place for more than 30 minutes at a time. Take short walks on level surfaces as soon as pain allows.Try to increase the length of time you walk each day.  Do not stay in bed.Resting more than 1 or 2 days can delay your recovery.  Do not avoid exercise or work.Your body is made to move.It is not dangerous to be active, even though your back may hurt.Your back will likely heal faster if you return to being active  before your pain is gone.  Pay attention to your body when you bend and lift. Many people have less discomfortwhen lifting if they bend their knees, keep the load close to their bodies,and avoid twisting. Often, the most comfortable positions are those that put less stress on your recovering back.  Find a comfortable position to sleep. Use a firm mattress and lie on your side with your knees slightly bent. If you lie on your back, put a pillow under your knees.  Only take over-the-counter or prescription medicines as directed by your caregiver. Over-the-counter medicines to reduce pain and inflammation are often the most helpful.Your caregiver may prescribe muscle relaxant drugs.These medicines help dull your pain so you can more quickly return to your normal activities and healthy exercise.  Put ice on the injured area.  Put ice in a plastic bag.  Place a towel between your skin and the bag.  Leave the ice on for 15-20 minutes, 03-04 times a day for the first 2 to 3 days. After that, ice and heat may be alternated to reduce pain and spasms.  Ask your caregiver about trying back exercises and gentle massage. This may be of some benefit.  Avoid feeling anxious or stressed.Stress increases muscle tension and can worsen back pain.It is important to recognize when you are anxious or stressed and learn ways to manage it.Exercise is a great option. SEEK MEDICAL CARE IF:  You have  pain that is not relieved with rest or medicine.  You have pain that does not improve in 1 week.  You have new symptoms.  You are generally not feeling well. SEEK IMMEDIATE MEDICAL CARE IF:   You have pain that radiates from your back into your legs.  You develop new bowel or bladder control problems.  You have unusual weakness or numbness in your arms or legs.  You develop nausea or vomiting.  You develop abdominal pain.  You feel faint. Document Released: 08/30/2005 Document Revised: 02/29/2012  Document Reviewed: 01/01/2014 [facility] Patient Information 2015 [place], [place]. This information is not intended to replace advice given to you by your health care provider. Make sure you discuss any questions you have with your health care provider.

## 2015-03-04 NOTE — Telephone Encounter (Signed)
Thanks for message Dr Darrick Penna, Geanie [place] can we please add patient on for Friday to evalute his leg pains  Dominga Ferry MD

## 2015-03-05 NOTE — Telephone Encounter (Signed)
REVIEWED.  

## 2015-03-07 ENCOUNTER — Ambulatory Visit (INDEPENDENT_AMBULATORY_CARE_PROVIDER_SITE_OTHER): Payer: Medicare Other | Admitting: Cardiology

## 2015-03-07 ENCOUNTER — Encounter: Payer: Self-pay | Admitting: Cardiology

## 2015-03-07 VITALS — BP 142/87 | HR 92 | Ht 69.0 in | Wt 173.4 lb

## 2015-03-07 DIAGNOSIS — M79606 Pain in leg, unspecified: Secondary | ICD-10-CM

## 2015-03-07 NOTE — Patient Instructions (Signed)
Your physician wants you to follow-up in: 6 months with Dr. Lurena Joiner will receive a reminder letter in the mail two months in advance. If you don't receive a letter, please call our office to schedule the follow-up appointment.  Your physician recommends that you continue on your current medications as directed. Please refer to the Current Medication list given to you today.  Your physician has requested that you have an ankle brachial index (ABI). During this test an ultrasound and blood pressure cuff are used to evaluate the arteries that supply the arms and legs with blood. Allow thirty minutes for this exam. There are no restrictions or special instructions.  Thank you for choosing [facility]!!

## 2015-03-07 NOTE — Progress Notes (Signed)
Clinical Summary Lucas Morgan is a 74 y.o.male seen today for follow up of the following medical problems. This is a focused visit on his history of leg pains, for more detailed medical history refer to previous clinic notes  1. Leg pain - history of lumbar stenosis with neurogenic claudication, s/p laminectomy 10/2014.  - leg symptoms have not improved since back surgery - describes tingling feeling bilateral calves. Worst with walking. No foot sores.    Past Medical History  Diagnosis Date  . Diabetes mellitus   . Dyslipidemia   . MI (myocardial infarction) feb 27/10  . AAA (abdominal aortic aneurysm)     a. Abd U/S 7/14: mild aneurysmal dilatation 3x3 cm; cholelithiasis without evid of cholecystitis => repeat 1 year  . Coronary artery disease   . Arthritis     stenosis, lumbar region  . Renal insufficiency      No Known Allergies   Current Outpatient Prescriptions  Medication Sig Dispense Refill  . ALPRAZolam (XANAX) 0.5 MG tablet Take 0.5 mg by mouth 2 (two) times daily as needed.  0  . aspirin 81 MG tablet Take 81 mg by mouth daily.      Marland Kitchen doxycycline (VIBRAMYCIN) 100 MG capsule Take 100 mg by mouth every 12 (twelve) hours. 21 day supply starting 02/19/15  0  . HYDROcodone-acetaminophen (NORCO) 10-325 MG per tablet Take 1 tablet by mouth every 6 (six) hours as needed (pain). 60 tablet 0  . LANTUS SOLOSTAR 100 UNIT/ML Solostar Pen Inject 100 Units into the skin at bedtime.  0  . ondansetron (ZOFRAN) 4 MG tablet Take 1 tablet (4 mg total) by mouth every 6 (six) hours. 12 tablet 0   No current facility-administered medications for this visit.     Past Surgical History  Procedure Laterality Date  . Knee surgery    . Coronary stent placement    . Back surgery  2015    lumbar fusion  . Joint replacement Left   . 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 Lumbar laminectomy/decompression microdiscectomy N/A 10/18/2014    Procedure: LUMBAR  LAMINECTOMY/DECOMPRESSION MICRODISCECTOMYLUMBAR THREE-FOUR ;  Surgeon: Temple Pacini, MD;  Location: [facility];  Service: Neurosurgery;  Laterality: N/A;  . Coronary angioplasty       No Known Allergies    Family History  Problem Relation Age of Onset  . Heart attack Brother     x2 brothers     Social History Lucas Morgan reports that he quit smoking about 21 years ago. He has never used smokeless tobacco. Lucas Morgan reports that he does not drink alcohol.   Review of Systems CONSTITUTIONAL: No weight loss, fever, chills, weakness or fatigue.  HEENT: Eyes: No visual loss, blurred vision, double vision or yellow sclerae.No hearing loss, sneezing, congestion, runny nose or sore throat.  SKIN: No rash or itching.  CARDIOVASCULAR:no chest pain, no palpitaitons  RESPIRATORY: No shortness of breath, cough or sputum.  GASTROINTESTINAL: No anorexia, nausea, vomiting or diarrhea. No abdominal pain or blood.  GENITOURINARY: No burning on urination, no polyuria NEUROLOGICAL: No headache, dizziness, syncope, paralysis, ataxia, numbness or tingling in the extremities. No change in bowel or bladder control.  MUSCULOSKELETAL: back pain, leg pains LYMPHATICS: No enlarged nodes. No history of splenectomy.  PSYCHIATRIC: No history of depression or anxiety.  ENDOCRINOLOGIC: No reports of sweating, cold or heat intolerance. No polyuria or polydipsia.  Marland Kitchen   Physical  Examination Filed Vitals:   03/07/15 1139  BP: 142/87  Pulse: 92   Filed Vitals:   03/07/15 1139  Height: 5\' 9"  (1.753 m)  Weight: 173 lb 6.4 oz (78.654 kg)    Gen: resting comfortably, no acute distress HEENT: no scleral icterus, pupils equal round and reactive, no palptable cervical adenopathy,  CV: RRR, no m/r/g. Decreased bilateral DP pulses Resp: Clear to auscultation bilaterally GI: abdomen is soft, non-tender, non-distended, normal bowel sounds, no hepatosplenomegaly MSK: extremities are warm, no edema.  Skin:  warm, no rash Neuro:  no focal deficits Psych: appropriate affect   Diagnostic Studies Cath 12/2009 PROCEDURAL FINDINGS: The right coronary artery is small and nondominant. There is no significant obstructive disease.  Left mainstem: The left main is widely patent. There is no significant stenosis. The left main divides into the LAD and left circumflex.  LAD: The LAD is patent throughout its course. There is a widely patent stent in the proximal LAD with no significant in-stent restenosis. There are luminal irregularities throughout the mid LAD without obstructive disease. The first diagonal is a large Garlin Batdorf with severe ostial narrowing. There is TIMI III flow but the ostium of the diagonal appears hypodense with 95-99% stenosis.  Left circumflex: The circumflex is patent. The first OM Davieon Stockham has a patent stent and it is a large Julyana [place]. The stent has no significant restenosis. The AV groove circumflex courses down and has minor luminal irregularities but no significant stenosis is noted. The left circumflex is dominant and supplies a small left PDA.  FINAL ASSESSMENT: 1. Severe ostial diagonal stenosis with successful cutting balloon  angioplasty. 2. Continued patency of the left anterior descending (coronary artery)  and obtuse marginal stents with otherwise nonobstructive coronary  artery disease.  10/2009 MPI IMPRESSION: Large perfusion defect identified at the lateral and inferior walls of the left ventricle with evidence of significant reperfusion at the lateral wall portion of the defect on resting exam. Mildly decreased left ventricle ejection fraction of 43%. Hypokinesia of the lateral and inferior walls of the left ventricle.  05/2014 Echo LVEF 50-55%, abnormal diastolic function.      Assessment and Plan  1. Leg pain - potntially vascular clauditation, will obtain ABI   F/u 6 months     Antoine Poche, M.D

## 2015-03-12 ENCOUNTER — Ambulatory Visit (INDEPENDENT_AMBULATORY_CARE_PROVIDER_SITE_OTHER): Payer: Medicare Other

## 2015-03-12 DIAGNOSIS — M79606 Pain in leg, unspecified: Secondary | ICD-10-CM | POA: Diagnosis not present

## 2015-03-14 ENCOUNTER — Encounter: Payer: Self-pay | Admitting: Gastroenterology

## 2015-03-14 ENCOUNTER — Telehealth: Payer: Self-pay | Admitting: *Deleted

## 2015-03-14 NOTE — Telephone Encounter (Signed)
-----   Message from Antoine PocheJonathan F Branch, MD sent at 03/12/2015  1:26 PM EDT ----- Normal circulation in legs based on ABIs. Does not appear his leg pains are related to circulation  Dominga FerryJ Branch MD

## 2015-03-14 NOTE — Telephone Encounter (Signed)
Pt aware, routed to pcp 

## 2015-03-18 ENCOUNTER — Encounter: Payer: Medicare Other | Admitting: Cardiology

## 2015-04-08 ENCOUNTER — Encounter: Payer: Self-pay | Admitting: Nurse Practitioner

## 2015-04-08 ENCOUNTER — Ambulatory Visit (HOSPITAL_COMMUNITY)
Admission: RE | Admit: 2015-04-08 | Discharge: 2015-04-08 | Disposition: A | Discharge status: Home / Self Care | Payer: Medicare Other | Source: Ambulatory Visit | Attending: Nurse Practitioner | Admitting: Nurse Practitioner

## 2015-04-08 ENCOUNTER — Ambulatory Visit (INDEPENDENT_AMBULATORY_CARE_PROVIDER_SITE_OTHER): Payer: Medicare Other | Admitting: Nurse Practitioner

## 2015-04-08 VITALS — BP 115/61 | HR 87 | Temp 97.8°F | Ht 69.0 in | Wt 177.8 lb

## 2015-04-08 DIAGNOSIS — X58XXXD Exposure to other specified factors, subsequent encounter: Secondary | ICD-10-CM | POA: Diagnosis not present

## 2015-04-08 DIAGNOSIS — T184XXA Foreign body in colon, initial encounter: Secondary | ICD-10-CM | POA: Insufficient documentation

## 2015-04-08 DIAGNOSIS — T184XXD Foreign body in colon, subsequent encounter: Secondary | ICD-10-CM

## 2015-04-08 NOTE — Assessment & Plan Note (Signed)
74 year old male in the emergency room approximately 1 month ago with abdominal pain. On CT imaging without contrast to his found to have a triangular foreign object in his transverse colon likely chicken bone remnant from a chicken and dumpling meal he had previously eaten. He was assured that it would likely passed without issue. Follows up with [place] today to reevaluate. His abdominal pain has since resolved. He has no current GI complaints and is asymptomatic from a GI standpoint. I will check an abdominal x-ray today just to verify no residual foreign body. Return for follow-up as needed for any GI complaints.

## 2015-04-08 NOTE — Patient Instructions (Signed)
1. Have your x-ray done when you're able. 2. Return for follow-up if he needed for any stomach/colon problems.

## 2015-04-08 NOTE — Progress Notes (Signed)
Primary Care Physician:  Phyllis Ginger Primary Gastroenterologist:  Dr. Darrick Penna  Chief Complaint  Patient presents with  . Abdominal Pain    HPI:   74 year old male presents for ER follow-up. She was seen in the emergency room 03/03/15 for abdominal pain and back pain and was subsequently found to have a small aortic aneurysm and a small piece of ingested bone in his intestines. Was referred to cardiology and GI. Per telephone notes Dr. Darrick Penna contact the patient's daughter and reassured her that the small piece of bone fragment was likely from her recent chicken and dumplings meal and should pass normally without problem. Per PCP notes, which are reviewed in addition to ER notes, patient has had interval improvement in his abdominal pain since ER discharge.  Today he states his abdominal pain is resolved. Continues to have back pain. Has had spinal fusion for fractured vertebrae. Has had chronic joint pain with worsening pain with walking. Has started doing pool exercises at a friends pool which has helped him quite a bit. Denies hematochezia and melena, N/V, passed FOB. Denies any GI signs or symptoms currently.  Past Medical History  Diagnosis Date  . Diabetes mellitus   . Dyslipidemia   . MI (myocardial infarction) feb 27/10  . AAA (abdominal aortic aneurysm)     a. Abd U/S 7/14: mild aneurysmal dilatation 3x3 cm; cholelithiasis without evid of cholecystitis => repeat 1 year  . Coronary artery disease   . Arthritis     stenosis, lumbar region  . Renal insufficiency     Past Surgical History  Procedure Laterality Date  . Knee surgery    . Coronary stent placement    . Back surgery  2015    lumbar fusion  . Joint replacement Left   . 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 Lumbar laminectomy/decompression microdiscectomy N/A 10/18/2014    Procedure: LUMBAR LAMINECTOMY/DECOMPRESSION MICRODISCECTOMYLUMBAR THREE-FOUR ;  Surgeon: Temple Pacini,  MD;  Location: [facility];  Service: Neurosurgery;  Laterality: N/A;  . Coronary angioplasty      Current Outpatient Prescriptions  Medication Sig Dispense Refill  . aspirin 81 MG tablet Take 81 mg by mouth daily.      Marland Kitchen glipiZIDE (GLUCOTROL) 10 MG tablet Take 10 mg by mouth daily before breakfast.     . HYDROcodone-acetaminophen (NORCO) 10-325 MG per tablet Take 1 tablet by mouth every 6 (six) hours as needed (pain). 60 tablet 0  . LANTUS SOLOSTAR 100 UNIT/ML Solostar Pen Inject 75 Units into the skin at bedtime.   0  . lisinopril (PRINIVIL,ZESTRIL) 5 MG tablet Take 5 mg by mouth daily.     . traZODone (DESYREL) 50 MG tablet     . ALPRAZolam (XANAX) 0.5 MG tablet Take 0.5 mg by mouth 2 (two) times daily as needed.  0  . doxycycline (VIBRAMYCIN) 100 MG capsule Take 100 mg by mouth every 12 (twelve) hours. 21 day supply starting 02/19/15  0  . ondansetron (ZOFRAN) 4 MG tablet Take 1 tablet (4 mg total) by mouth every 6 (six) hours. (Patient not taking: Reported on 04/08/2015) 12 tablet 0   No current facility-administered medications for this visit.    Allergies as of 04/08/2015  . (No Known Allergies)    Family History  Problem Relation Age of Onset  . Heart attack Brother     x2 brothers    History   Social  History  . Marital Status: Married    Spouse Name: N/A  . Number of Children: N/A  . Years of Education: N/A   Occupational History  . Not on file.   Social History Main Topics  . Smoking status: Former Smoker    Quit date: 09/13/1993  . Smokeless tobacco: Never Used  . Alcohol Use: No  . Drug Use: No  . Sexual Activity: Not on file   Other Topics Concern  . Not on file   Social History Narrative   The patient lives in [place] with the family. He denies any tobacco use.    Review of Systems: 10 point ROS negative except as per HPI.    Physical Exam: BP 115/61 mmHg  Pulse 87  Temp(Src) 97.8 F (36.6 C) (Oral)  Ht  (1.753 m)  Wt 177 lb  12.8 oz (80.65 kg)  BMI 26.24 kg/m2 General:   Alert and oriented. Pleasant and cooperative. Well-nourished and well-developed.  Head:  Normocephalic and atraumatic. Eyes:  Without icterus, sclera clear and conjunctiva pink.  Ears:  Normal auditory acuity. Cardiovascular:  S1, S2 present without murmurs appreciated. Normal pulses noted. Extremities without clubbing or edema. Respiratory:  Clear to auscultation bilaterally. No wheezes, rales, or rhonchi. No distress.  Gastrointestinal:  +BS, soft, non-tender and non-distended. No HSM noted. No guarding or rebound. No masses appreciated.  Rectal:  Deferred  Neurologic:  Alert and oriented x4;  grossly normal neurologically. Psych:  Alert and cooperative. Normal mood and affect. Heme/Lymph/Immune: No excessive bruising noted.    04/08/2015 11:09 AM

## 2015-04-15 NOTE — Progress Notes (Signed)
CC'ED TO PCP 

## 2015-04-21 ENCOUNTER — Other Ambulatory Visit (HOSPITAL_COMMUNITY): Payer: Self-pay | Admitting: Orthopedic Surgery

## 2015-04-21 DIAGNOSIS — M545 Low back pain: Secondary | ICD-10-CM

## 2015-05-02 ENCOUNTER — Ambulatory Visit (HOSPITAL_COMMUNITY)
Admission: RE | Admit: 2015-05-02 | Discharge: 2015-05-02 | Disposition: A | Discharge status: Home / Self Care | Payer: Medicare Other | Source: Ambulatory Visit | Attending: Orthopedic Surgery | Admitting: Orthopedic Surgery

## 2015-05-02 DIAGNOSIS — I714 Abdominal aortic aneurysm, without rupture: Secondary | ICD-10-CM | POA: Insufficient documentation

## 2015-05-02 DIAGNOSIS — R531 Weakness: Secondary | ICD-10-CM | POA: Diagnosis not present

## 2015-05-02 DIAGNOSIS — M4806 Spinal stenosis, lumbar region: Secondary | ICD-10-CM | POA: Insufficient documentation

## 2015-05-02 DIAGNOSIS — M4856XG Collapsed vertebra, not elsewhere classified, lumbar region, subsequent encounter for fracture with delayed healing: Secondary | ICD-10-CM | POA: Diagnosis not present

## 2015-05-02 DIAGNOSIS — M5126 Other intervertebral disc displacement, lumbar region: Secondary | ICD-10-CM | POA: Insufficient documentation

## 2015-05-02 DIAGNOSIS — M545 Low back pain: Secondary | ICD-10-CM | POA: Insufficient documentation

## 2015-05-02 DIAGNOSIS — R200 Anesthesia of skin: Secondary | ICD-10-CM | POA: Diagnosis not present

## 2015-05-24 ENCOUNTER — Emergency Department (HOSPITAL_COMMUNITY): Payer: Medicare Other

## 2015-05-24 ENCOUNTER — Encounter (HOSPITAL_COMMUNITY): Payer: Self-pay | Admitting: Emergency Medicine

## 2015-05-24 ENCOUNTER — Inpatient Hospital Stay (HOSPITAL_COMMUNITY)
Admission: EM | Admit: 2015-05-24 | Discharge: 2015-05-27 | DRG: 065 | Disposition: A | Discharge status: Rehabilitation Facility | Payer: Medicare Other | Attending: Internal Medicine | Admitting: Internal Medicine

## 2015-05-24 DIAGNOSIS — N189 Chronic kidney disease, unspecified: Secondary | ICD-10-CM | POA: Diagnosis not present

## 2015-05-24 DIAGNOSIS — Z7982 Long term (current) use of aspirin: Secondary | ICD-10-CM

## 2015-05-24 DIAGNOSIS — E1122 Type 2 diabetes mellitus with diabetic chronic kidney disease: Secondary | ICD-10-CM | POA: Diagnosis present

## 2015-05-24 DIAGNOSIS — I959 Hypotension, unspecified: Secondary | ICD-10-CM | POA: Diagnosis present

## 2015-05-24 DIAGNOSIS — I714 Abdominal aortic aneurysm, without rupture: Secondary | ICD-10-CM | POA: Diagnosis present

## 2015-05-24 DIAGNOSIS — N183 Chronic kidney disease, stage 3 (moderate): Secondary | ICD-10-CM | POA: Diagnosis present

## 2015-05-24 DIAGNOSIS — I63312 Cerebral infarction due to thrombosis of left middle cerebral artery: Secondary | ICD-10-CM | POA: Diagnosis not present

## 2015-05-24 DIAGNOSIS — I6529 Occlusion and stenosis of unspecified carotid artery: Secondary | ICD-10-CM | POA: Insufficient documentation

## 2015-05-24 DIAGNOSIS — M545 Low back pain: Secondary | ICD-10-CM | POA: Diagnosis present

## 2015-05-24 DIAGNOSIS — I63412 Cerebral infarction due to embolism of left middle cerebral artery: Secondary | ICD-10-CM | POA: Diagnosis not present

## 2015-05-24 DIAGNOSIS — N182 Chronic kidney disease, stage 2 (mild): Secondary | ICD-10-CM | POA: Diagnosis not present

## 2015-05-24 DIAGNOSIS — I5022 Chronic systolic (congestive) heart failure: Secondary | ICD-10-CM | POA: Diagnosis present

## 2015-05-24 DIAGNOSIS — M48062 Spinal stenosis, lumbar region with neurogenic claudication: Secondary | ICD-10-CM | POA: Diagnosis present

## 2015-05-24 DIAGNOSIS — G8929 Other chronic pain: Secondary | ICD-10-CM | POA: Diagnosis present

## 2015-05-24 DIAGNOSIS — I63419 Cerebral infarction due to embolism of unspecified middle cerebral artery: Secondary | ICD-10-CM | POA: Diagnosis not present

## 2015-05-24 DIAGNOSIS — Z87891 Personal history of nicotine dependence: Secondary | ICD-10-CM

## 2015-05-24 DIAGNOSIS — E118 Type 2 diabetes mellitus with unspecified complications: Secondary | ICD-10-CM | POA: Diagnosis present

## 2015-05-24 DIAGNOSIS — Z888 Allergy status to other drugs, medicaments and biological substances status: Secondary | ICD-10-CM | POA: Diagnosis not present

## 2015-05-24 DIAGNOSIS — I69320 Aphasia following cerebral infarction: Secondary | ICD-10-CM | POA: Diagnosis not present

## 2015-05-24 DIAGNOSIS — Z794 Long term (current) use of insulin: Secondary | ICD-10-CM

## 2015-05-24 DIAGNOSIS — I6789 Other cerebrovascular disease: Secondary | ICD-10-CM | POA: Diagnosis not present

## 2015-05-24 DIAGNOSIS — I69351 Hemiplegia and hemiparesis following cerebral infarction affecting right dominant side: Secondary | ICD-10-CM | POA: Diagnosis not present

## 2015-05-24 DIAGNOSIS — M4806 Spinal stenosis, lumbar region: Secondary | ICD-10-CM | POA: Diagnosis present

## 2015-05-24 DIAGNOSIS — E785 Hyperlipidemia, unspecified: Secondary | ICD-10-CM | POA: Diagnosis present

## 2015-05-24 DIAGNOSIS — I252 Old myocardial infarction: Secondary | ICD-10-CM

## 2015-05-24 DIAGNOSIS — I2510 Atherosclerotic heart disease of native coronary artery without angina pectoris: Secondary | ICD-10-CM | POA: Diagnosis not present

## 2015-05-24 DIAGNOSIS — R05 Cough: Secondary | ICD-10-CM

## 2015-05-24 DIAGNOSIS — I129 Hypertensive chronic kidney disease with stage 1 through stage 4 chronic kidney disease, or unspecified chronic kidney disease: Secondary | ICD-10-CM | POA: Diagnosis present

## 2015-05-24 DIAGNOSIS — I429 Cardiomyopathy, unspecified: Secondary | ICD-10-CM | POA: Diagnosis not present

## 2015-05-24 DIAGNOSIS — R059 Cough, unspecified: Secondary | ICD-10-CM

## 2015-05-24 DIAGNOSIS — I6340 Cerebral infarction due to embolism of unspecified cerebral artery: Secondary | ICD-10-CM | POA: Diagnosis present

## 2015-05-24 DIAGNOSIS — D72829 Elevated white blood cell count, unspecified: Secondary | ICD-10-CM | POA: Diagnosis present

## 2015-05-24 DIAGNOSIS — G8190 Hemiplegia, unspecified affecting unspecified side: Secondary | ICD-10-CM | POA: Diagnosis not present

## 2015-05-24 DIAGNOSIS — N179 Acute kidney failure, unspecified: Secondary | ICD-10-CM | POA: Diagnosis present

## 2015-05-24 DIAGNOSIS — I69920 Aphasia following unspecified cerebrovascular disease: Secondary | ICD-10-CM | POA: Diagnosis not present

## 2015-05-24 DIAGNOSIS — I639 Cerebral infarction, unspecified: Secondary | ICD-10-CM | POA: Diagnosis present

## 2015-05-24 DIAGNOSIS — R4701 Aphasia: Secondary | ICD-10-CM | POA: Diagnosis not present

## 2015-05-24 DIAGNOSIS — I6522 Occlusion and stenosis of left carotid artery: Secondary | ICD-10-CM | POA: Diagnosis not present

## 2015-05-24 HISTORY — DX: Cerebral infarction, unspecified: I63.9

## 2015-05-24 LAB — PROTIME-INR
INR: 1.09 (ref 0.00–1.49)
PROTHROMBIN TIME: 14.3 s (ref 11.6–15.2)

## 2015-05-24 LAB — GLUCOSE, CAPILLARY: Glucose-Capillary: 203 mg/dL — ABNORMAL HIGH (ref 65–99)

## 2015-05-24 LAB — I-STAT CHEM 8, ED
BUN: 43 mg/dL — AB (ref 6–20)
CHLORIDE: 100 mmol/L — AB (ref 101–111)
Calcium, Ion: 1.18 mmol/L (ref 1.13–1.30)
Creatinine, Ser: 1.8 mg/dL — ABNORMAL HIGH (ref 0.61–1.24)
Glucose, Bld: 181 mg/dL — ABNORMAL HIGH (ref 65–99)
HEMATOCRIT: 46 % (ref 39.0–52.0)
Hemoglobin: 15.6 g/dL (ref 13.0–17.0)
Potassium: 4.5 mmol/L (ref 3.5–5.1)
SODIUM: 136 mmol/L (ref 135–145)
TCO2: 25 mmol/L (ref 0–100)

## 2015-05-24 LAB — COMPREHENSIVE METABOLIC PANEL
ALBUMIN: 3.3 g/dL — AB (ref 3.5–5.0)
ALK PHOS: 52 U/L (ref 38–126)
ALT: 14 U/L — ABNORMAL LOW (ref 17–63)
ANION GAP: 9 (ref 5–15)
AST: 17 U/L (ref 15–41)
BUN: 37 mg/dL — ABNORMAL HIGH (ref 6–20)
CHLORIDE: 103 mmol/L (ref 101–111)
CO2: 23 mmol/L (ref 22–32)
Calcium: 9 mg/dL (ref 8.9–10.3)
Creatinine, Ser: 1.87 mg/dL — ABNORMAL HIGH (ref 0.61–1.24)
GFR calc Af Amer: 39 mL/min — ABNORMAL LOW (ref >60)
GFR calc non Af Amer: 34 mL/min — ABNORMAL LOW (ref >60)
GLUCOSE: 182 mg/dL — AB (ref 65–99)
POTASSIUM: 4.5 mmol/L (ref 3.5–5.1)
SODIUM: 135 mmol/L (ref 135–145)
Total Bilirubin: 0.7 mg/dL (ref 0.3–1.2)
Total Protein: 6.6 g/dL (ref 6.5–8.1)

## 2015-05-24 LAB — CBC
HCT: 43.2 % (ref 39.0–52.0)
Hemoglobin: 15.4 g/dL (ref 13.0–17.0)
MCH: 31.8 pg (ref 26.0–34.0)
MCHC: 35.6 g/dL (ref 30.0–36.0)
MCV: 89.3 fL (ref 78.0–100.0)
PLATELETS: 214 10*3/uL (ref 150–400)
RBC: 4.84 MIL/uL (ref 4.22–5.81)
RDW: 12.5 % (ref 11.5–15.5)
WBC: 14.1 10*3/uL — ABNORMAL HIGH (ref 4.0–10.5)

## 2015-05-24 LAB — I-STAT TROPONIN, ED: Troponin i, poc: 0.02 ng/mL (ref 0.00–0.08)

## 2015-05-24 LAB — ETHANOL: Alcohol, Ethyl (B): <5 mg/dL (ref <5)

## 2015-05-24 LAB — DIFFERENTIAL
BASOS PCT: 0 % (ref 0–1)
Basophils Absolute: 0 10*3/uL (ref 0.0–0.1)
EOS ABS: 0.2 10*3/uL (ref 0.0–0.7)
EOS PCT: 1 % (ref 0–5)
LYMPHS PCT: 17 % (ref 12–46)
Lymphs Abs: 2.4 10*3/uL (ref 0.7–4.0)
Monocytes Absolute: 1.4 10*3/uL — ABNORMAL HIGH (ref 0.1–1.0)
Monocytes Relative: 10 % (ref 3–12)
NEUTROS PCT: 72 % (ref 43–77)
Neutro Abs: 10.1 10*3/uL — ABNORMAL HIGH (ref 1.7–7.7)

## 2015-05-24 LAB — CBG MONITORING, ED: GLUCOSE-CAPILLARY: 187 mg/dL — AB (ref 65–99)

## 2015-05-24 LAB — APTT: aPTT: 27 seconds (ref 24–37)

## 2015-05-24 MED ORDER — ASPIRIN 325 MG PO TABS
325.0000 mg | ORAL_TABLET | Freq: Every day | ORAL | Status: DC
  Administered 2015-05-24 – 2015-05-27 (×4): 325 mg via ORAL
  Filled 2015-05-24 (×5): qty 1

## 2015-05-24 MED ORDER — ACETAMINOPHEN 650 MG RE SUPP: 650.0000 mg | RECTAL | Status: DC | PRN

## 2015-05-24 MED ORDER — ALPRAZOLAM 0.5 MG PO TABS
0.5000 mg | ORAL_TABLET | Freq: Two times a day (BID) | ORAL | Status: DC
  Administered 2015-05-24 – 2015-05-27 (×6): 0.5 mg via ORAL
  Filled 2015-05-24 (×7): qty 1

## 2015-05-24 MED ORDER — SODIUM CHLORIDE 0.9 % IV SOLN
INTRAVENOUS | Status: DC
  Administered 2015-05-24: 23:00:00 via INTRAVENOUS

## 2015-05-24 MED ORDER — INSULIN GLARGINE 100 UNIT/ML SOLOSTAR PEN: 30.0000 [IU] | PEN_INJECTOR | Freq: Every day | SUBCUTANEOUS | Status: DC

## 2015-05-24 MED ORDER — ACETAMINOPHEN 325 MG PO TABS
650.0000 mg | ORAL_TABLET | ORAL | Status: DC | PRN
  Administered 2015-05-24: 650 mg via ORAL
  Filled 2015-05-24: qty 2

## 2015-05-24 MED ORDER — HYDROCODONE-ACETAMINOPHEN 10-325 MG PO TABS
1.0000 | ORAL_TABLET | Freq: Four times a day (QID) | ORAL | Status: DC | PRN
  Administered 2015-05-25 – 2015-05-27 (×6): 1 via ORAL
  Filled 2015-05-24 (×6): qty 1

## 2015-05-24 MED ORDER — ASPIRIN 300 MG RE SUPP: 300.0000 mg | Freq: Every day | RECTAL | Status: DC

## 2015-05-24 MED ORDER — SENNOSIDES-DOCUSATE SODIUM 8.6-50 MG PO TABS
1.0000 | ORAL_TABLET | Freq: Every evening | ORAL | Status: DC | PRN
  Administered 2015-05-26: 1 via ORAL
  Filled 2015-05-24: qty 1

## 2015-05-24 MED ORDER — SIMVASTATIN 40 MG PO TABS
40.0000 mg | ORAL_TABLET | Freq: Every day | ORAL | Status: DC
  Administered 2015-05-24 – 2015-05-26 (×3): 40 mg via ORAL
  Filled 2015-05-24 (×3): qty 1

## 2015-05-24 MED ORDER — INSULIN GLARGINE 100 UNIT/ML ~~LOC~~ SOLN
30.0000 [IU] | Freq: Every day | SUBCUTANEOUS | Status: DC
  Administered 2015-05-24 – 2015-05-26 (×3): 30 [IU] via SUBCUTANEOUS
  Filled 2015-05-24 (×4): qty 0.3

## 2015-05-24 MED ORDER — STROKE: EARLY STAGES OF RECOVERY BOOK
Freq: Once | Status: AC
  Administered 2015-05-24: 23:00:00

## 2015-05-24 MED ORDER — SODIUM CHLORIDE 0.9 % IV BOLUS (SEPSIS)
500.0000 mL | Freq: Once | INTRAVENOUS | Status: AC
  Administered 2015-05-24: 500 mL via INTRAVENOUS

## 2015-05-24 NOTE — ED Notes (Signed)
Pt given ice water per Dr. Adela Glimpse

## 2015-05-24 NOTE — Consult Note (Signed)
Stroke Consult    Chief Complaint: right sided weakness and aphasia HPI: KEYNAN HEFFERN is an 74 y.o. male hx of HTN, DM, CAD, recent CVA 4 days ago presenting with worsening of his speech and right sided weakness. Per EMS he had a confirmed stroke on Tuesday for which he was treated at [facility]. He had aphasia and right sided weakness which did not resolve from that stroke. Today at 1300 family notes that his speech difficulties and weakness appeared to worsen. Though they note it seemed worse when he woke up. Family is not present/able to be reached so it is unclear how severe his symptoms were after recent stroke and how worse his symptoms are now.   CT head imaging reviewed with neuroradiology, shows well defined area of cytotoxic edema affecting the left superior frontal cortex and subcortical white matter consistent with a late acute cerebral infarction.   Date last known well: 05/20/2015 Time last known well:  unclear tPA Given: no, recent CVA 4 days ago without resolution of symptoms, unclear last seen well Modified Rankin: unclear  Past Medical History  Diagnosis Date  . Diabetes mellitus   . Dyslipidemia   . MI (myocardial infarction) feb 27/10  . AAA (abdominal aortic aneurysm)     a. Abd U/S 7/14: mild aneurysmal dilatation 3x3 cm; cholelithiasis without evid of cholecystitis => repeat 1 year  . Coronary artery disease   . Arthritis     stenosis, lumbar region  . Renal insufficiency     Past Surgical History  Procedure Laterality Date  . Knee surgery    . Coronary stent placement    . Back surgery  2015    lumbar fusion  . Joint replacement Left   . 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 Lumbar laminectomy/decompression microdiscectomy N/A 10/18/2014    Procedure: LUMBAR LAMINECTOMY/DECOMPRESSION MICRODISCECTOMYLUMBAR THREE-FOUR ;  Surgeon: Temple Pacini, MD;  Location: [facility];  Service: Neurosurgery;  Laterality: N/A;  .  Coronary angioplasty      Family History  Problem Relation Age of Onset  . Heart attack Brother     x2 brothers   Social History:  reports that he quit smoking about 21 years ago. He has never used smokeless tobacco. He reports that he does not drink alcohol or use illicit drugs.  Allergies: No Known Allergies   (Not in a hospital admission)  ROS: Out of a complete 14 system review, the patient complains of only the following symptoms, and all other reviewed systems are negative. + speech difficulty, right sided weakness  Physical Examination: There were no vitals filed for this visit. Physical Exam  Constitutional: He appears well-developed and well-nourished.  Psych: Affect appropriate to situation Eyes: No scleral injection HENT: No OP obstrucion Head: Normocephalic.  Cardiovascular: Normal rate and regular rhythm.  Respiratory: Effort normal and breath sounds normal.  GI: Soft. Bowel sounds are normal. No distension. There is no tenderness.  Skin: WDI  Neurologic Examination: Mental Status: Alert, oriented, makes eye contact. Expressive> receptive aphasia. Mild dysarthria. Will intermittently follow simple commands, unable to follow multistep Cranial Nerves: II: unable to visualize optic discs, visual fields grossly normal, pupils equal, round, reactive to light and accommodation III,IV, VI: ptosis not present, extra-ocular motions intact bilaterally V,VII: right facial droop, facial light touch sensation normal bilaterally VIII: hearing normal bilaterally IX,X: gag reflex present XI: trapezius strength/neck flexion strength normal bilaterally XII: tongue strength  normal  Motor: Right : Upper extremity    Left:     Upper extremity 4+/5 deltoid       5/5 deltoid 4+/5 biceps      5/5 biceps  4+/5 triceps      5/5 triceps 4+/5 hand grip      5/5 hand grip  Lower extremity     Lower extremity 4+/5 hip flexor      5/5 hip flexor 5-/5 quadricep      5/5  quadriceps  5-/5 hamstrings     5/5 hamstrings 5-/5 plantar flexion       5/5 plantar flexion 5-/5 plantar extension     5/5 plantar extension Tone and bulk:normal tone throughout; no atrophy noted Sensory: Pinprick and light touch intact throughout, bilaterally Deep Tendon Reflexes: 2+ and symmetric throughout Plantars: Right: downgoing   Left: downgoing Cerebellar: Difficulty to test as not following commands Gait: deferred  Laboratory Studies:   Basic Metabolic Panel:  Recent Labs Lab 05/24/15 1747  NA 136  K 4.5  CL 100*  GLUCOSE 181*  BUN 43*  CREATININE 1.80*    Liver Function Tests: No results for input(s): AST, ALT, ALKPHOS, BILITOT, PROT, ALBUMIN in the last 168 hours. No results for input(s): LIPASE, AMYLASE in the last 168 hours. No results for input(s): AMMONIA in the last 168 hours.  CBC:  Recent Labs Lab 05/24/15 1740 05/24/15 1747  WBC 14.1*  --   NEUTROABS 10.1*  --   HGB 15.4 15.6  HCT 43.2 46.0  MCV 89.3  --   PLT 214  --     Cardiac Enzymes: No results for input(s): CKTOTAL, CKMB, CKMBINDEX, TROPONINI in the last 168 hours.  BNP: Invalid input(s): POCBNP  CBG:  Recent Labs Lab 05/24/15 1741  GLUCAP 187*    Microbiology: Results for orders placed or performed during the hospital encounter of 10/18/14  Surgical pcr screen     Status: None   Collection Time: 10/18/14  7:20 AM  Result Value Ref Range Status   MRSA, PCR NEGATIVE NEGATIVE Final   Staphylococcus aureus NEGATIVE NEGATIVE Final    Comment:        The Xpert SA Assay (FDA approved for NASAL specimens in patients over 24 years of age), is one component of a comprehensive surveillance program.  Test performance has been validated by [facility] for patients greater than or equal to 42 year old. It is not intended to diagnose infection nor to guide or monitor treatment.     Coagulation Studies: No results for input(s): LABPROT, INR in the last 72  hours.  Urinalysis: No results for input(s): COLORURINE, LABSPEC, PHURINE, GLUCOSEU, HGBUR, BILIRUBINUR, KETONESUR, PROTEINUR, UROBILINOGEN, NITRITE, LEUKOCYTESUR in the last 168 hours.  Invalid input(s): APPERANCEUR  Lipid Panel:     Component Value Date/Time   CHOL  12/21/2009 0450    117        ATP III CLASSIFICATION:  <200     mg/dL   Desirable  161-096  mg/dL   Borderline High  >=045    mg/dL   High          TRIG 409* 12/21/2009 0450   HDL 31* 12/21/2009 0450   CHOLHDL 3.8 12/21/2009 0450   VLDL 33 12/21/2009 0450   LDLCALC  12/21/2009 0450    53        Total Cholesterol/HDL:CHD Risk Coronary Heart Disease Risk Table  Men   Women  1/2 Average Risk   3.4   3.3  Average Risk       5.0   4.4  2 X Average Risk   9.6   7.1  3 X Average Risk  23.4   11.0        Use the calculated Patient Ratio above and the CHD Risk Table to determine the patient's CHD Risk.        ATP III CLASSIFICATION (LDL):  <100     mg/dL   Optimal  478-295  mg/dL   Near or Above                    Optimal  130-159  mg/dL   Borderline  621-308  mg/dL   High  >657     mg/dL   Very High    QION6E:  Lab Results  Component Value Date   HGBA1C * 12/21/2009    10.4 (NOTE) The ADA recommends the following therapeutic goal for glycemic control related to Hgb A1c measurement: Goal of therapy: <6.5 Hgb A1c  Reference: American Diabetes Association: Clinical Practice Recommendations 2010, Diabetes Care, 2010, 33: (Suppl  1).    Urine Drug Screen:  No results found for: LABOPIA, COCAINSCRNUR, LABBENZ, AMPHETMU, THCU, LABBARB  Alcohol Level: No results for input(s): ETH in the last 168 hours.  Other results:  Imaging: No results found.  Assessment: 74 y.o. male hx of HTN, CAD, DM, recent CVA 4 days ago with residual aphasia and right sided weakness. Presents today as code stroke with reported worsening of his symptoms. Due to recent stroke with unclear baseline and last seen well  he is not a tPA or intervention candidate. Admit for further workup.    Plan: 1. MRI brain and MRA head 2. Get records from May 3. Hold on further stroke workup pending records 4. ASA 325mg  daily 5. Rehab evaluation 6. Check CBC, UA, CMP, CXR   Elspeth Cho, DO [facility] [phone]  If 7pm- 7am, please page neurology on call as listed in AMION. 05/24/2015, 6:03 PM

## 2015-05-24 NOTE — ED Notes (Signed)
Neuro MD waiting for family to arrive for more information. Unable to reach by phone.

## 2015-05-24 NOTE — ED Notes (Signed)
Had a stroke on Tuesday, seen in [facility]; right deficits and aphasia. Family called EMS because symptoms seemed worse today. Family has not arrived. Patient alert and oriented. Moves all extremities, right weaker. Garbled speech. Right facial droop. Sent to CT on arrival.

## 2015-05-24 NOTE — ED Provider Notes (Signed)
CSN: [id number]     Arrival date & time 05/24/15  1739 History   First MD Initiated Contact with Patient 05/24/15 1822     Chief Complaint  Patient presents with  . Code Stroke     HPI Had a stroke on Tuesday, seen in [facility]; right deficits and aphasia. Family called EMS because symptoms seemed worse today. Family has not arrived. Patient alert and oriented. Moves all extremities, right weaker. Garbled speech. Right facial droop. Sent to CT on arrival. Past Medical History  Diagnosis Date  . Diabetes mellitus   . Dyslipidemia   . MI (myocardial infarction) 11/09/2008    2.5 x 23 Xience V DES to the CFX  . AAA (abdominal aortic aneurysm)     a. Abd U/S 7/14: mild aneurysmal dilatation 3x3 cm; cholelithiasis without evid of cholecystitis => repeat 1 year  . Coronary artery disease   . Arthritis     stenosis, lumbar region  . Renal insufficiency   . Stroke    Past Surgical History  Procedure Laterality Date  . Knee surgery    . Coronary stent placement    . Back surgery  2015    lumbar fusion  . Joint replacement Left   . 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 Lumbar laminectomy/decompression microdiscectomy N/A 10/18/2014    Procedure: LUMBAR LAMINECTOMY/DECOMPRESSION MICRODISCECTOMYLUMBAR THREE-FOUR ;  Surgeon: Temple Pacini, MD;  Location: [facility] NEURO ORS;  Service: Neurosurgery;  Laterality: N/A;  . Coronary angioplasty    . Ep implantable device N/A 05/26/2015    Procedure: Loop Recorder Insertion;  Surgeon: Marinus Maw, MD;  Location: [facility] INVASIVE CV LAB;  Service: Cardiovascular;  Laterality: N/A;  . Tee without cardioversion N/A 05/26/2015    Procedure: TRANSESOPHAGEAL ECHOCARDIOGRAM (TEE);  Surgeon: Vesta Mixer, MD;  Location: [facility] ENDOSCOPY;  Service: Cardiovascular;  Laterality: N/A;   Family History  Problem Relation Age of Onset  . Heart attack Brother     x2 brothers  . CAD Brother    Social History  Substance Use Topics   . Smoking status: Former Smoker    Quit date: 09/13/1993  . Smokeless tobacco: Never Used  . Alcohol Use: No    Review of Systems  Neurological: Positive for dizziness and speech difficulty.  All other systems reviewed and are negative.     Allergies  Trazodone and nefazodone  Home Medications   Prior to Admission medications   Medication Sig Start Date End Date Taking? Authorizing Provider  ALPRAZolam Prudy Feeler) 0.5 MG tablet Take 0.5 mg by mouth 2 (two) times daily.  02/21/15  Yes Historical Provider, MD  glipiZIDE (GLUCOTROL) 10 MG tablet Take 10 mg by mouth daily before breakfast.  04/07/15  Yes Historical Provider, MD  simvastatin (ZOCOR) 40 MG tablet Take 40 mg by mouth at bedtime. 05/08/15  Yes Historical Provider, MD  aspirin 325 MG tablet Take 1 tablet (325 mg total) by mouth daily. 05/27/15   Shanker Levora Dredge, MD  LANTUS SOLOSTAR 100 UNIT/ML Solostar Pen Inject 30 Units into the skin at bedtime. 05/27/15   Shanker Levora Dredge, MD   BP 99/51 mmHg  Pulse 99  Temp(Src) 98.3 F (36.8 C) (Oral)  Resp 18  Wt 168 lb 11.2 oz (76.522 kg)  SpO2 96% Physical Exam  Constitutional: He is oriented to person, place, and time. He appears well-developed and well-nourished. No distress.  HENT:  Head: Normocephalic and atraumatic.  Eyes: Pupils are equal, round, and reactive to light.  Neck: Normal range of motion.  Cardiovascular: Normal rate and intact distal pulses.   Pulmonary/Chest: No respiratory distress.  Abdominal: Normal appearance. He exhibits no distension.  Musculoskeletal: Normal range of motion.  Neurological: He is alert and oriented to person, place, and time. No cranial nerve deficit. GCS eye subscore is 4. GCS verbal subscore is 5. GCS motor subscore is 6.  Skin: Skin is warm and dry. No rash noted.  Psychiatric: He has a normal mood and affect. His behavior is normal.  Nursing note and vitals reviewed.   ED Course  Procedures (including critical care time) Labs  Review Labs Reviewed  CBC - Abnormal; Notable for the following:    WBC 14.1 (*)    All other components within normal limits  DIFFERENTIAL - Abnormal; Notable for the following:    Neutro Abs 10.1 (*)    Monocytes Absolute 1.4 (*)    All other components within normal limits  COMPREHENSIVE METABOLIC PANEL - Abnormal; Notable for the following:    Glucose, Bld 182 (*)    BUN 37 (*)    Creatinine, Ser 1.87 (*)    Albumin 3.3 (*)    ALT 14 (*)    GFR calc non Af Amer 34 (*)    GFR calc Af Amer 39 (*)    All other components within normal limits  HEMOGLOBIN A1C - Abnormal; Notable for the following:    Hgb A1c MFr Bld 7.5 (*)    All other components within normal limits  LIPID PANEL - Abnormal; Notable for the following:    HDL 33 (*)    All other components within normal limits  GLUCOSE, CAPILLARY - Abnormal; Notable for the following:    Glucose-Capillary 203 (*)    All other components within normal limits  CBC - Abnormal; Notable for the following:    MCHC 36.7 (*)    All other components within normal limits  BASIC METABOLIC PANEL - Abnormal; Notable for the following:    Glucose, Bld 119 (*)    BUN 26 (*)    Creatinine, Ser 1.31 (*)    GFR calc non Af Amer 52 (*)    All other components within normal limits  GLUCOSE, CAPILLARY - Abnormal; Notable for the following:    Glucose-Capillary 212 (*)    All other components within normal limits  GLUCOSE, CAPILLARY - Abnormal; Notable for the following:    Glucose-Capillary 261 (*)    All other components within normal limits  GLUCOSE, CAPILLARY - Abnormal; Notable for the following:    Glucose-Capillary 123 (*)    All other components within normal limits  CBC - Abnormal; Notable for the following:    WBC 11.6 (*)    All other components within normal limits  CREATININE, SERUM - Abnormal; Notable for the following:    Creatinine, Ser 1.39 (*)    GFR calc non Af Amer 49 (*)    GFR calc Af Amer 56 (*)    All other  components within normal limits  GLUCOSE, CAPILLARY - Abnormal; Notable for the following:    Glucose-Capillary 107 (*)    All other components within normal limits  GLUCOSE, CAPILLARY - Abnormal; Notable for the following:    Glucose-Capillary 242 (*)    All other components within normal limits  GLUCOSE, CAPILLARY - Abnormal; Notable for the following:    Glucose-Capillary 243 (*)    All other components within normal  limits  BASIC METABOLIC PANEL - Abnormal; Notable for the following:    Sodium 134 (*)    Chloride 100 (*)    Glucose, Bld 171 (*)    Creatinine, Ser 1.31 (*)    GFR calc non Af Amer 52 (*)    All other components within normal limits  GLUCOSE, CAPILLARY - Abnormal; Notable for the following:    Glucose-Capillary 172 (*)    All other components within normal limits  GLUCOSE, CAPILLARY - Abnormal; Notable for the following:    Glucose-Capillary 232 (*)    All other components within normal limits  GLUCOSE, CAPILLARY - Abnormal; Notable for the following:    Glucose-Capillary 184 (*)    All other components within normal limits  I-STAT CHEM 8, ED - Abnormal; Notable for the following:    Chloride 100 (*)    BUN 43 (*)    Creatinine, Ser 1.80 (*)    Glucose, Bld 181 (*)    All other components within normal limits  CBG MONITORING, ED - Abnormal; Notable for the following:    Glucose-Capillary 187 (*)    All other components within normal limits  URINE CULTURE  ETHANOL  PROTIME-INR  APTT  CBC  I-STAT TROPOININ, ED    Imaging Review No results found. I have personally reviewed and evaluated these images and lab results as part of my medical decision-making.   EKG Interpretation   Date/Time:  Saturday May 24 2015 18:10:00 EDT Ventricular Rate:  84 PR Interval:  167 QRS Duration: 131 QT Interval:  426 QTC Calculation: 504 R Axis:   -53 Text Interpretation:  Sinus rhythm Multiform ventricular premature  complexes RBBB and LAFB Left ventricular  hypertrophy Lateral infarct, age  indeterminate Abnormal ekg Confirmed by Radford Pax  MD, Rosmarie Esquibel [phone]) on  05/24/2015 6:23:59 PM     Results for orders placed or performed during the hospital encounter of 05/27/15  Glucose, capillary  Result Value Ref Range   Glucose-Capillary 341 (H) 65 - 99 mg/dL  Glucose, capillary  Result Value Ref Range   Glucose-Capillary 173 (H) 65 - 99 mg/dL  Glucose, capillary  Result Value Ref Range   Glucose-Capillary 223 (H) 65 - 99 mg/dL   Comment 1 Notify RN   Glucose, capillary  Result Value Ref Range   Glucose-Capillary 206 (H) 65 - 99 mg/dL   Comment 1 Notify RN   Basic metabolic panel  Result Value Ref Range   Sodium 134 (L) 135 - 145 mmol/L   Potassium 3.9 3.5 - 5.1 mmol/L   Chloride 98 (L) 101 - 111 mmol/L   CO2 26 22 - 32 mmol/L   Glucose, Bld 250 (H) 65 - 99 mg/dL   BUN 22 (H) 6 - 20 mg/dL   Creatinine, Ser 6.04 (H) 0.61 - 1.24 mg/dL   Calcium 9.0 8.9 - 54.0 mg/dL   GFR calc non Af Amer 51 (L) >60 mL/min   GFR calc Af Amer 60 (L) >60 mL/min   Anion gap 10 5 - 15  Glucose, capillary  Result Value Ref Range   Glucose-Capillary 192 (H) 65 - 99 mg/dL  Glucose, capillary  Result Value Ref Range   Glucose-Capillary 169 (H) 65 - 99 mg/dL  Glucose, capillary  Result Value Ref Range   Glucose-Capillary 151 (H) 65 - 99 mg/dL  Glucose, capillary  Result Value Ref Range   Glucose-Capillary 155 (H) 65 - 99 mg/dL  Basic metabolic panel  Result Value Ref Range   Sodium 137  135 - 145 mmol/L   Potassium 3.9 3.5 - 5.1 mmol/L   Chloride 106 101 - 111 mmol/L   CO2 25 22 - 32 mmol/L   Glucose, Bld 114 (H) 65 - 99 mg/dL   BUN 18 6 - 20 mg/dL   Creatinine, Ser 5.28 0.61 - 1.24 mg/dL   Calcium 8.8 (L) 8.9 - 10.3 mg/dL   GFR calc non Af Amer >60 >60 mL/min   GFR calc Af Amer >60 >60 mL/min   Anion gap 6 5 - 15  Glucose, capillary  Result Value Ref Range   Glucose-Capillary 256 (H) 65 - 99 mg/dL  Glucose, capillary  Result Value Ref Range    Glucose-Capillary 101 (H) 65 - 99 mg/dL  Urinalysis, Routine w reflex microscopic (not at [facility])  Result Value Ref Range   Color, Urine YELLOW YELLOW   APPearance CLEAR CLEAR   Specific Gravity, Urine 1.013 1.005 - 1.030   pH 6.0 5.0 - 8.0   Glucose, UA 250 (A) NEGATIVE mg/dL   Hgb urine dipstick NEGATIVE NEGATIVE   Bilirubin Urine NEGATIVE NEGATIVE   Ketones, ur NEGATIVE NEGATIVE mg/dL   Protein, ur NEGATIVE NEGATIVE mg/dL   Urobilinogen, UA 1.0 0.0 - 1.0 mg/dL   Nitrite NEGATIVE NEGATIVE   Leukocytes, UA NEGATIVE NEGATIVE  Glucose, capillary  Result Value Ref Range   Glucose-Capillary 101 (H) 65 - 99 mg/dL  Glucose, capillary  Result Value Ref Range   Glucose-Capillary 206 (H) 65 - 99 mg/dL  Basic metabolic panel  Result Value Ref Range   Sodium 136 135 - 145 mmol/L   Potassium 4.1 3.5 - 5.1 mmol/L   Chloride 102 101 - 111 mmol/L   CO2 24 22 - 32 mmol/L   Glucose, Bld 176 (H) 65 - 99 mg/dL   BUN 21 (H) 6 - 20 mg/dL   Creatinine, Ser 4.13 0.61 - 1.24 mg/dL   Calcium 9.0 8.9 - 24.4 mg/dL   GFR calc non Af Amer 56 (L) >60 mL/min   GFR calc Af Amer >60 >60 mL/min   Anion gap 10 5 - 15  Glucose, capillary  Result Value Ref Range   Glucose-Capillary 132 (H) 65 - 99 mg/dL   Comment 1 Notify RN   Glucose, capillary  Result Value Ref Range   Glucose-Capillary 160 (H) 65 - 99 mg/dL  Glucose, capillary  Result Value Ref Range   Glucose-Capillary 146 (H) 65 - 99 mg/dL  Glucose, capillary  Result Value Ref Range   Glucose-Capillary 188 (H) 65 - 99 mg/dL  Glucose, capillary  Result Value Ref Range   Glucose-Capillary 119 (H) 65 - 99 mg/dL  Glucose, capillary  Result Value Ref Range   Glucose-Capillary 124 (H) 65 - 99 mg/dL  Glucose, capillary  Result Value Ref Range   Glucose-Capillary 169 (H) 65 - 99 mg/dL  Glucose, capillary  Result Value Ref Range   Glucose-Capillary 105 (H) 65 - 99 mg/dL  Glucose, capillary  Result Value Ref Range   Glucose-Capillary 263 (H) 65 -  99 mg/dL   Comment 1 Notify RN   Glucose, capillary  Result Value Ref Range   Glucose-Capillary 117 (H) 65 - 99 mg/dL  Glucose, capillary  Result Value Ref Range   Glucose-Capillary 68 65 - 99 mg/dL  Glucose, capillary  Result Value Ref Range   Glucose-Capillary 117 (H) 65 - 99 mg/dL  Glucose, capillary  Result Value Ref Range   Glucose-Capillary 202 (H) 65 - 99 mg/dL  Creatinine, serum  Result Value Ref Range   Creatinine, Ser 1.30 (H) 0.61 - 1.24 mg/dL   GFR calc non Af Amer 53 (L) >60 mL/min   GFR calc Af Amer >60 >60 mL/min  Glucose, capillary  Result Value Ref Range   Glucose-Capillary 222 (H) 65 - 99 mg/dL   Comment 1 Notify RN   Glucose, capillary  Result Value Ref Range   Glucose-Capillary 110 (H) 65 - 99 mg/dL   Comment 1 Notify RN   Glucose, capillary  Result Value Ref Range   Glucose-Capillary 76 65 - 99 mg/dL  Glucose, capillary  Result Value Ref Range   Glucose-Capillary 144 (H) 65 - 99 mg/dL  Glucose, capillary  Result Value Ref Range   Glucose-Capillary 211 (H) 65 - 99 mg/dL  Glucose, capillary  Result Value Ref Range   Glucose-Capillary 102 (H) 65 - 99 mg/dL  Glucose, capillary  Result Value Ref Range   Glucose-Capillary 135 (H) 65 - 99 mg/dL  Glucose, capillary  Result Value Ref Range   Glucose-Capillary 98 65 - 99 mg/dL  Glucose, capillary  Result Value Ref Range   Glucose-Capillary 134 (H) 65 - 99 mg/dL  Glucose, capillary  Result Value Ref Range   Glucose-Capillary 78 65 - 99 mg/dL   Dg Chest 2 View  1/91/4782   CLINICAL DATA:  Cough.  Recent stroke.  EXAM: CHEST  2 VIEW  COMPARISON:  10/18/2014  FINDINGS: The patient is mildly rotated to the right which partially limits evaluation of the mediastinum. Cardiomediastinal silhouette is grossly unchanged, with cardiac silhouette upper limits of normal in size. The patient has taken a slightly shallower inspiration than on the prior study. Chronic interstitial coarsening is again seen and may  reflect underlying COPD/chronic bronchitis. There is slightly increased heterogeneous opacity in the right lung base which projects posteriorly on the lateral image. No pleural effusion or pneumothorax is identified. Degenerative changes are noted at the [facility] joints.  IMPRESSION: COPD with mild right basilar infiltrate versus atelectasis.   Electronically Signed   By: Sebastian Ache M.D.   On: 05/25/2015 12:17   Dg Abd 1 View  05/30/2015   CLINICAL DATA:  Abdominal pain and tenderness  EXAM: ABDOMEN - 1 VIEW  COMPARISON:  04/08/2015  FINDINGS: The bowel gas pattern is normal. Moderate stool burden identified throughout the colon. No radio-opaque calculi or other significant radiographic abnormality are seen.  IMPRESSION: 1. Moderate stool burden suggesting constipation.   Electronically Signed   By: Signa Kell M.D.   On: 05/30/2015 08:58   Ct Head Wo Contrast  05/24/2015   CLINICAL DATA:  Code stroke. RIGHT-sided weakness. Symptoms began 4 days ago.  EXAM: CT HEAD WITHOUT CONTRAST  TECHNIQUE: Contiguous axial images were obtained from the base of the skull through the vertex without intravenous contrast.  COMPARISON:  CT head 06/10/2014.  MR brain 06/10/2014.  FINDINGS: Well-defined area of cytotoxic edema affects the LEFT superior frontal cortex and subcortical white matter consistent with an ischemic event onset 4 days ago. No hemorrhagic transformation. No other areas of acute infarction are suspected.  No CT findings to suggest proximal vascular thrombosis.  Mild atrophy. Chronic LEFT frontal and parietal subcortical infarction related to previous LEFT MCA territory ischemic insult documented on September 2015 MR. An additional LEFT parietal insult predated that stroke.  Hypoattenuation of the periventricular and subcortical regions consistent with small vessel disease.  No mass lesion, hydrocephalus, or extra-axial fluid.  Calvarium intact.  No sinus or mastoid air fluid level.  IMPRESSION: Well-defined  area of cytotoxic edema affecting the LEFT superior frontal cortex and subcortical white matter consistent with a late acute cerebral infarction. No features to suggest proximal vascular occlusion or hemorrhagic transformation.  Chronic changes as described.  Critical Value/emergent results were called by telephone at the time of interpretation on 05/24/2015 at 6:00 pm to Dr. Nelva Nay , who verbally acknowledged these results.   Electronically Signed   By: Elsie Stain M.D.   On: 05/24/2015 18:01   Ct Angio Neck W/cm &/or Wo/cm  05/28/2015   CLINICAL DATA:  Functional deficits secondary to bilateral multi vascular territory infarcts.  EXAM: CT ANGIOGRAPHY NECK  TECHNIQUE: Multidetector CT imaging of the neck was performed using the standard protocol during bolus administration of intravenous contrast. Multiplanar CT image reconstructions and MIPs were obtained to evaluate the vascular anatomy. Carotid stenosis measurements (when applicable) are obtained utilizing NASCET criteria, using the distal internal carotid diameter as the denominator.  CONTRAST:  50mL OMNIPAQUE IOHEXOL 350 MG/ML SOLN  COMPARISON:  MRI of the brain May 25, 2015  FINDINGS: Normal appearance of the thoracic arch, normal branch pattern, mild calcific atherosclerosis. The origins of the innominate, left Common carotid artery and subclavian artery are widely patent.  Bilateral Common carotid arteries are widely patent, coursing in a straight line fashion. Mild intimal thickening of the distal Common carotid arteries. Calcific atherosclerosis of bilateral carotid bulbs. 15 mm segment of 50% stenosis RIGHT ICA origin. 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 No pseudoaneurysm. Normal appearance of the included internal carotid arteries.  Left vertebral artery is dominant. Normal  appearance of the vertebral arteries, which appear widely patent. RIGHT vertebral artery predominately terminates in the posterior inferior cerebellar artery.  No contrast extravasation.  17 mm LEFT thyroid nodule. LEFT parotid sialolith. Subcentimeter radiopaque foreign body within the RIGHT superficial neck subcutaneous fat may represent catheter fragment or other non metallic object. No acute osseous process though bone windows have not been submitted ; severe RIGHT C5-6 neural foraminal narrowing. Mild bronchial wall thickening and patchy upper lobe airspace opacities which could represent atelectasis or infiltrate, biapical bullous changes.  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  50% stenosis RIGHT ICA origin.  17 mm LEFT thyroid nodule for which follow-up dedicated thyroid sonogram is recommended on a nonemergent basis.   Electronically Signed   By: Awilda Metro M.D.   On: 05/28/2015 22:33   Mr Brain Wo Contrast  05/25/2015   CLINICAL DATA:  Recent left-sided infarct with right-sided weakness and aphasia. The weakness has improved but the aphasia continues. The patient was discharged from an outside hospital 2 days ago.  EXAM: MRI HEAD WITHOUT CONTRAST  MRA HEAD WITHOUT CONTRAST  TECHNIQUE: Multiplanar, multiecho pulse sequences of the brain and surrounding structures were obtained without intravenous contrast. Angiographic images of the head were obtained using MRA technique without contrast.  COMPARISON:  None.  CT head without contrast 05/24/2015.  FINDINGS: MRI HEAD FINDINGS  The acute/subacute cortical and subcortical infarct in the high left frontal lobe is confirmed. This involves the [employer]. Additional scattered foci are present in the frontal lobe and parietal lobe on the left and what appears to be watershed distribution. There is  additional involvement of  the more superior and medial primary motor cortex. Portions of the inferior parietal and left occipital lobe are noted as well.  Additionally, at least 4 punctate areas of restricted diffusion are present in the right parietal and medial occipital lobe. There is no hemorrhage associated with these lesions. T2 changes are evident within the areas of acute/subacute infarct.  Moderate periventricular and subcortical T2 changes are additionally present bilaterally. Brainstem is unremarkable. Flow is present in the major intracranial arteries.  The study is moderately degraded by patient motion and could not be completed.  The left globe is collapsed with increased signal. The right globe is intact. The orbits are otherwise unremarkable. The paranasal sinuses and mastoid air cells are clear. Midline structures are unremarkable.  MRA HEAD FINDINGS  The internal carotid arteries are within normal limits from the high cervical segments through the ICA termini bilaterally. Moderate narrowing is present in the mid right A1 segment. More mild narrowing is present in the mid left A1 segment. The M1 segments are intact. The left ACA is duplicated. The anterior communicating artery appears to be patent. The MCA bifurcations are intact bilaterally. There is moderate attenuation of MCA branch vessels distally, right greater than left.  The right vertebral artery terminates at the PICA. The left vertebral artery is within normal limits. The PICA origin is visualized and normal. The basilar artery is within normal limits. Both posterior cerebral arteries originate from the basilar tip. There is some attenuation of distal PCA branch vessels.  IMPRESSION: 1. Acute/subacute nonhemorrhagic infarct involving the left frontal lobe to level of the precentral gyrus. 2. Additional smaller non confluent punctate areas of infarction within the more anterior left frontal lobe, the left parietal lobe, and left occipital  lobe seen to follow a watershed distribution. This suggests more proximal disease, potentially in the neck, or and episode of hypotension. 3. At least 4 punctate foci of acute nonhemorrhagic infarct are noted within the right parietal and occipital lobe. Given the bilateral distribution, a central embolic source also needs to be considered. 4. Age advanced atrophy and diffuse white matter disease. 5. Moderate right and mild left mid A1 segment stenoses. 6. Moderate distal small vessel disease, most evident within the right MCA branch vessels.   Electronically Signed   By: Marin Roberts M.D.   On: 05/25/2015 10:41   Mr Maxine Glenn Head/brain Wo Cm  05/25/2015   CLINICAL DATA:  Recent left-sided infarct with right-sided weakness and aphasia. The weakness has improved but the aphasia continues. The patient was discharged from an outside hospital 2 days ago.  EXAM: MRI HEAD WITHOUT CONTRAST  MRA HEAD WITHOUT CONTRAST  TECHNIQUE: Multiplanar, multiecho pulse sequences of the brain and surrounding structures were obtained without intravenous contrast. Angiographic images of the head were obtained using MRA technique without contrast.  COMPARISON:  None.  CT head without contrast 05/24/2015.  FINDINGS: MRI HEAD FINDINGS  The acute/subacute cortical and subcortical infarct in the high left frontal lobe is confirmed. This involves the [employer]. Additional scattered foci are present in the frontal lobe and parietal lobe on the left and what appears to be watershed distribution. There is additional involvement of the more superior and medial primary motor cortex. Portions of the inferior parietal and left occipital lobe are noted as well.  Additionally, at least 4 punctate areas of restricted diffusion are present in the right parietal and medial occipital lobe. There is no hemorrhage associated with these lesions. T2 changes are evident within the areas  of acute/subacute infarct.  Moderate periventricular and  subcortical T2 changes are additionally present bilaterally. Brainstem is unremarkable. Flow is present in the major intracranial arteries.  The study is moderately degraded by patient motion and could not be completed.  The left globe is collapsed with increased signal. The right globe is intact. The orbits are otherwise unremarkable. The paranasal sinuses and mastoid air cells are clear. Midline structures are unremarkable.  MRA HEAD FINDINGS  The internal carotid arteries are within normal limits from the high cervical segments through the ICA termini bilaterally. Moderate narrowing is present in the mid right A1 segment. More mild narrowing is present in the mid left A1 segment. The M1 segments are intact. The left ACA is duplicated. The anterior communicating artery appears to be patent. The MCA bifurcations are intact bilaterally. There is moderate attenuation of MCA branch vessels distally, right greater than left.  The right vertebral artery terminates at the PICA. The left vertebral artery is within normal limits. The PICA origin is visualized and normal. The basilar artery is within normal limits. Both posterior cerebral arteries originate from the basilar tip. There is some attenuation of distal PCA branch vessels.  IMPRESSION: 1. Acute/subacute nonhemorrhagic infarct involving the left frontal lobe to level of the precentral gyrus. 2. Additional smaller non confluent punctate areas of infarction within the more anterior left frontal lobe, the left parietal lobe, and left occipital lobe seen to follow a watershed distribution. This suggests more proximal disease, potentially in the neck, or and episode of hypotension. 3. At least 4 punctate foci of acute nonhemorrhagic infarct are noted within the right parietal and occipital lobe. Given the bilateral distribution, a central embolic source also needs to be considered. 4. Age advanced atrophy and diffuse white matter disease. 5. Moderate right and mild  left mid A1 segment stenoses. 6. Moderate distal small vessel disease, most evident within the right MCA branch vessels.   Electronically Signed   By: Marin Roberts M.D.   On: 05/25/2015 10:41    Neurology consulted. 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

## 2015-05-24 NOTE — ED Notes (Signed)
Attempted report 

## 2015-05-24 NOTE — ED Notes (Signed)
Admitting at bedside 

## 2015-05-24 NOTE — ED Notes (Signed)
Family at the bedside speaking with the neurologist

## 2015-05-24 NOTE — H&P (Signed)
PCP:  Abran Richard PA-C    Referring provider [place]   Chief Complaint:  Right side weakness  HPI: Lucas Morgan is a 74 y.o. male   has a past medical history of Diabetes mellitus; Dyslipidemia; MI (myocardial infarction) (feb 27/10); AAA (abdominal aortic aneurysm); Coronary artery disease; Arthritis; Renal insufficiency; and Stroke.   Presented with  Per Family patient had a stroke on Tuesday 4 days ago and was seen at [facility]. Family states they did MRI but not sure about the echo. At the time he presented with aphasia and right-sided weakness which lasted only few  hours. The aphasia seem to continue. He was discharged from Hospital yesterday.  His symptoms were worse today after we woke up from a nap. He had more pronounced right side weakness and garbled speech. Family is unsure if the change occurred when he woke up or little bit later. Patient was brought to emergency department by EMS. CT of the head showed well-defined area of edema affecting left superior frontal cortex and subcortical white matter consistent with late acute CVA. Patient was seen by Elspeth Cho with neurology Recommend admission to medicine with obtaining MRI of the brain and MRA. Make sure patient on daily aspirin.  Hospitalist was called for admission for neurological findings consistent with stroke of unknown chronicity. Of note patient was also noted to have elevated creatinine up from baseline 1.2 today 1.8. Family thinks he has been drinking plenty of fluids. No recent vomiting but some nausea. He has chronic dry cough.  Of note while at [facility] he had severe constipation. After he was treated for that he had a loose BM. Per family he had a stool study done and was started on Flagyl but they were never told why. Since discharge he have not had anymore diarrhea. HE only had 1 tablet of Flagyl.   Patient has hx of DM 2 which has been well controlled.  Patient has chronic back pain  he went to  [facility] on Friday 2 nd September and had an epidural injection done.  Review of Systems:    Pertinent positives include: Right-sided weakness and aphasia,  nausea,   Constitutional:  No weight loss, night sweats, Fevers, chills, fatigue, weight loss  HEENT:  No headaches, Difficulty swallowing,Tooth/dental problems,Sore throat,  No sneezing, itching, ear ache, nasal congestion, post nasal drip,  Cardio-vascular:  No chest pain, Orthopnea, PND, anasarca, dizziness, palpitations.no Bilateral lower extremity swelling  GI:  No heartburn, indigestion, abdominal pain,vomiting, diarrhea, change in bowel habits, loss of appetite, melena, blood in stool, hematemesis Resp:  no shortness of breath at rest. No dyspnea on exertion, No excess mucus, no productive cough, No non-productive cough, No coughing up of blood.No change in color of mucus.No wheezing. Skin:  no rash or lesions. No jaundice GU:  no dysuria, change in color of urine, no urgency or frequency. No straining to urinate.  No flank pain.  Musculoskeletal:  No joint pain or no joint swelling. No decreased range of motion. No back pain.  Psych:  No change in mood or affect. No depression or anxiety. No memory loss.  Neuro: no localizing neurological complaints, no tingling, no weakness, no double vision, no gait abnormality, no slurred speech, no confusion  Otherwise ROS are negative except for above, 10 systems were reviewed  Past Medical History: Past Medical History  Diagnosis Date  . Diabetes mellitus   . Dyslipidemia   . MI (myocardial infarction) feb 27/10  . AAA (abdominal aortic  aneurysm)     a. Abd U/S 7/14: mild aneurysmal dilatation 3x3 cm; cholelithiasis without evid of cholecystitis => repeat 1 year  . Coronary artery disease   . Arthritis     stenosis, lumbar region  . Renal insufficiency   . Stroke    Past Surgical History  Procedure Laterality Date  . Knee surgery    . Coronary stent placement    .  Back surgery  2015    lumbar fusion  . Joint replacement Left   . 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 Lumbar laminectomy/decompression microdiscectomy N/A 10/18/2014    Procedure: LUMBAR LAMINECTOMY/DECOMPRESSION MICRODISCECTOMYLUMBAR THREE-FOUR ;  Surgeon: Temple Pacini, MD;  Location: [facility];  Service: Neurosurgery;  Laterality: N/A;  . Coronary angioplasty       Medications: Prior to Admission medications   Medication Sig Start Date End Date Taking? Authorizing Provider  ALPRAZolam Prudy Feeler) 0.5 MG tablet Take 0.5 mg by mouth 2 (two) times daily as needed. 02/21/15   Historical Provider, MD  aspirin 81 MG tablet Take 81 mg by mouth daily.      Historical Provider, MD  doxycycline (VIBRAMYCIN) 100 MG capsule Take 100 mg by mouth every 12 (twelve) hours. 21 day supply starting 02/19/15 02/19/15   Historical Provider, MD  glipiZIDE (GLUCOTROL) 10 MG tablet Take 10 mg by mouth daily before breakfast.  04/07/15   Historical Provider, MD  HYDROcodone-acetaminophen (NORCO) 10-325 MG per tablet Take 1 tablet by mouth every 6 (six) hours as needed (pain). 10/19/14   Maeola Harman, MD  LANTUS SOLOSTAR 100 UNIT/ML Solostar Pen Inject 100 Units into the skin at bedtime.  02/26/15   Historical Provider, MD  lisinopril (PRINIVIL,ZESTRIL) 2.5 MG tablet Take 2.5 mg by mouth daily. 05/23/15   Historical Provider, MD  lisinopril (PRINIVIL,ZESTRIL) 5 MG tablet Take 5 mg by mouth daily.  04/07/15   Historical Provider, MD  metroNIDAZOLE (FLAGYL) 500 MG tablet Take 500 mg by mouth. 05/23/15   Historical Provider, MD  ondansetron (ZOFRAN) 4 MG tablet Take 1 tablet (4 mg total) by mouth every 6 (six) hours. Patient not taking: Reported on 04/08/2015 03/04/15   Glynn Octave, MD  simvastatin (ZOCOR) 40 MG tablet Take 40 mg by mouth at bedtime. 05/08/15   Historical Provider, MD  traZODone (DESYREL) 50 MG tablet  04/07/15   Historical Provider, MD    Allergies:  No Known Allergies  Social  History:  Ambulatory  Independently  Lives at home  With family    reports that he quit smoking about 21 years ago. He has never used smokeless tobacco. He reports that he does not drink alcohol or use illicit drugs.    Family History: family history includes CAD in his brother; Heart attack in his brother.    Physical Exam: Patient Vitals for the past 24 hrs:  BP Pulse Resp SpO2  05/24/15 1901 104/60 mmHg 87 17 91 %  05/24/15 1845 98/61 mmHg 84 20 92 %  05/24/15 1830 135/85 mmHg 90 23 95 %  05/24/15 1815 108/55 mmHg 81 17 92 %    1. General:  in No Acute distress 2. Psychological: Alert and  Oriented 3. Head/ENT:     Dry Mucous Membranes                          Head Non traumatic, neck supple  Normal   Dentition 4. SKIN:   decreased Skin turgor,  Skin clean Dry and intact no rash 5. Heart: Regular rate and rhythm no Murmur, Rub or gallop 6. Lungs: Clear to auscultation bilaterally, no wheezes or crackles   7. Abdomen: Soft, non-tender, Non distended 8. Lower extremities: no clubbing, cyanosis, or edema 9. Neurologically right side weakness, right arm drift 10. MSK: Normal range of motion  body mass index is unknown because there is no weight on file.   Labs on Admission:   Results for orders placed or performed during the hospital encounter of 05/24/15 (from the past 24 hour(s))  Ethanol     Status: None   Collection Time: 05/24/15  5:40 PM  Result Value Ref Range   Alcohol, Ethyl (B) <5 <5 mg/dL  Protime-INR     Status: None   Collection Time: 05/24/15  5:40 PM  Result Value Ref Range   Prothrombin Time 14.3 11.6 - 15.2 seconds   INR 1.09 0.00 - 1.49  APTT     Status: None   Collection Time: 05/24/15  5:40 PM  Result Value Ref Range   aPTT 27 24 - 37 seconds  CBC     Status: Abnormal   Collection Time: 05/24/15  5:40 PM  Result Value Ref Range   WBC 14.1 (H) 4.0 - 10.5 K/uL   RBC 4.84 4.22 - 5.81 MIL/uL   Hemoglobin 15.4 13.0 -  17.0 g/dL   HCT 40.9 81.1 - 91.4 %   MCV 89.3 78.0 - 100.0 fL   MCH 31.8 26.0 - 34.0 pg   MCHC 35.6 30.0 - 36.0 g/dL   RDW 78.2 95.6 - 21.3 %   Platelets 214 150 - 400 K/uL  Differential     Status: Abnormal   Collection Time: 05/24/15  5:40 PM  Result Value Ref Range   Neutrophils Relative % 72 43 - 77 %   Neutro Abs 10.1 (H) 1.7 - 7.7 K/uL   Lymphocytes Relative 17 12 - 46 %   Lymphs Abs 2.4 0.7 - 4.0 K/uL   Monocytes Relative 10 3 - 12 %   Monocytes Absolute 1.4 (H) 0.1 - 1.0 K/uL   Eosinophils Relative 1 0 - 5 %   Eosinophils Absolute 0.2 0.0 - 0.7 K/uL   Basophils Relative 0 0 - 1 %   Basophils Absolute 0.0 0.0 - 0.1 K/uL  Comprehensive metabolic panel     Status: Abnormal   Collection Time: 05/24/15  5:40 PM  Result Value Ref Range   Sodium 135 135 - 145 mmol/L   Potassium 4.5 3.5 - 5.1 mmol/L   Chloride 103 101 - 111 mmol/L   CO2 23 22 - 32 mmol/L   Glucose, Bld 182 (H) 65 - 99 mg/dL   BUN 37 (H) 6 - 20 mg/dL   Creatinine, Ser 0.86 (H) 0.61 - 1.24 mg/dL   Calcium 9.0 8.9 - 57.8 mg/dL   Total Protein 6.6 6.5 - 8.1 g/dL   Albumin 3.3 (L) 3.5 - 5.0 g/dL   AST 17 15 - 41 U/L   ALT 14 (L) 17 - 63 U/L   Alkaline Phosphatase 52 38 - 126 U/L   Total Bilirubin 0.7 0.3 - 1.2 mg/dL   GFR calc non Af Amer 34 (L) >60 mL/min   GFR calc Af Amer 39 (L) >60 mL/min   Anion gap 9 5 - 15  CBG monitoring, ED     Status: Abnormal   Collection Time: 05/24/15  5:41 PM  Result Value Ref Range   Glucose-Capillary 187 (H) 65 - 99 mg/dL  I-stat troponin, ED (not at [facility], [facility])     Status: None   Collection Time: 05/24/15  5:46 PM  Result Value Ref Range   Troponin i, poc 0.02 0.00 - 0.08 ng/mL   Comment 3          I-Stat Chem 8, ED  (not at [facility], [facility])     Status: Abnormal   Collection Time: 05/24/15  5:47 PM  Result Value Ref Range   Sodium 136 135 - 145 mmol/L   Potassium 4.5 3.5 - 5.1 mmol/L   Chloride 100 (L) 101 - 111 mmol/L   BUN 43 (H) 6 - 20 mg/dL   Creatinine, Ser 4.09 (H)  0.61 - 1.24 mg/dL   Glucose, Bld 811 (H) 65 - 99 mg/dL   Calcium, Ion 9.14 7.82 - 1.30 mmol/L   TCO2 25 0 - 100 mmol/L   Hemoglobin 15.6 13.0 - 17.0 g/dL   HCT 95.6 21.3 - 08.6 %    UA odered  Lab Results  Component Value Date   HGBA1C * 12/21/2009    10.4 (NOTE) The ADA recommends the following therapeutic goal for glycemic control related to Hgb A1c measurement: Goal of therapy: <6.5 Hgb A1c  Reference: American Diabetes Association: Clinical Practice Recommendations 2010, Diabetes Care, 2010, 33: (Suppl  1).    CrCl cannot be calculated (Unknown ideal weight.).  BNP (last 3 results) No results for input(s): PROBNP in the last 8760 hours.  Other results:  I have pearsonaly reviewed this: ECG REPORT  Rate: 84  Rhythm: RBBB with PVC's ST&T Change: no ischemic changes QTC 507  There were no vitals filed for this visit.   Cultures: No results found for: SDES, SPECREQUEST, CULT, REPTSTATUS   Radiological Exams on Admission: Ct Head Wo Contrast  05/24/2015   CLINICAL DATA:  Code stroke. RIGHT-sided weakness. Symptoms began 4 days ago.  EXAM: CT HEAD WITHOUT CONTRAST  TECHNIQUE: Contiguous axial images were obtained from the base of the skull through the vertex without intravenous contrast.  COMPARISON:  CT head 06/10/2014.  MR brain 06/10/2014.  FINDINGS: Well-defined area of cytotoxic edema affects the LEFT superior frontal cortex and subcortical white matter consistent with an ischemic event onset 4 days ago. No hemorrhagic transformation. No other areas of acute infarction are suspected.  No CT findings to suggest proximal vascular thrombosis.  Mild atrophy. Chronic LEFT frontal and parietal subcortical infarction related to previous LEFT MCA territory ischemic insult documented on September 2015 MR. An additional LEFT parietal insult predated that stroke.  Hypoattenuation of the periventricular and subcortical regions consistent with small vessel disease.  No mass lesion,  hydrocephalus, or extra-axial fluid.  Calvarium intact.  No sinus or mastoid air fluid level.  IMPRESSION: Well-defined area of cytotoxic edema affecting the LEFT superior frontal cortex and subcortical white matter consistent with a late acute cerebral infarction. No features to suggest proximal vascular occlusion or hemorrhagic transformation.  Chronic changes as described.  Critical Value/emergent results were called by telephone at the time of interpretation on 05/24/2015 at 6:00 pm to Dr. Nelva Nay , who verbally acknowledged these results.   Electronically Signed   By: Elsie Stain M.D.   On: 05/24/2015 18:01    Chart has been reviewed  Family   at  Bedside  plan of care was discussed with  Daughter, America Brown [phone], He also has a daughter who lives with him  Eunice Blase [phone] Assessment/Plan  74 yo With hx of CAD, DM2 was admitted to [facility] ago with CVA and was discharged to home yesterday developed worsened symptoms. CT showing subacute CVA. Per neurology will obtain MRI/MRA and records from [facility] to avoid test multiplication.   Present on Admission:  . CVA (cerebral infarction) -  - will admit based on TIA/CVA protocol, await results of MRA/MRI,and records from [place], obtain cardiac enzymes,  ECG,   Lipid panel, TSH. Order PT/OT evaluation. Will make sure patient is on antiplatelet agent.   Neurology consulted.    Aspiration precaution and swallow eval . CAD, NATIVE VESSEL - currently stable , continue aspirin and statin . Lumbar stenosis with neurogenic claudication - continue pain managemnt . 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 Acute on chronic renal failure - rehydrate and check urine electrolytes . Leukocytosis - no fever or chills, hold off on Flagyl for now, since no diarrhea, apears dehydrated, possibly hemoconcentration Hypotension - will hold lisinopril, rehydrate, per family no hx of high  blood pressure, usually runs low Repeat BP has improved up to 107/80.    Prophylaxis: SCD   CODE STATUS:  FULL CODE   as per patient    Disposition:   likely will need placement for rehabilitation                  Other plan as per orders.  I have spent a total of 55 min on this admission  Tawonna Esquer 05/24/2015, 7:12 PM  [facility]  Pager [phone]   after 2 AM please page floor coverage PA If 7AM-7PM, please contact the day team taking care of the patient  [email or web address]  Password [email or web address]

## 2015-05-25 ENCOUNTER — Inpatient Hospital Stay (HOSPITAL_COMMUNITY): Payer: Medicare Other

## 2015-05-25 DIAGNOSIS — M4806 Spinal stenosis, lumbar region: Secondary | ICD-10-CM

## 2015-05-25 LAB — CBC
HEMATOCRIT: 43.6 % (ref 39.0–52.0)
Hemoglobin: 16 g/dL (ref 13.0–17.0)
MCH: 32.2 pg (ref 26.0–34.0)
MCHC: 36.7 g/dL — ABNORMAL HIGH (ref 30.0–36.0)
MCV: 87.7 fL (ref 78.0–100.0)
Platelets: 208 10*3/uL (ref 150–400)
RBC: 4.97 MIL/uL (ref 4.22–5.81)
RDW: 12.5 % (ref 11.5–15.5)
WBC: 10.5 10*3/uL (ref 4.0–10.5)

## 2015-05-25 LAB — LIPID PANEL
CHOLESTEROL: 119 mg/dL (ref 0–200)
HDL: 33 mg/dL — AB (ref >40)
LDL Cholesterol: 60 mg/dL (ref 0–99)
Total CHOL/HDL Ratio: 3.6 RATIO
Triglycerides: 129 mg/dL (ref <150)
VLDL: 26 mg/dL (ref 0–40)

## 2015-05-25 LAB — BASIC METABOLIC PANEL
ANION GAP: 7 (ref 5–15)
BUN: 26 mg/dL — ABNORMAL HIGH (ref 6–20)
CO2: 24 mmol/L (ref 22–32)
Calcium: 8.9 mg/dL (ref 8.9–10.3)
Chloride: 105 mmol/L (ref 101–111)
Creatinine, Ser: 1.31 mg/dL — ABNORMAL HIGH (ref 0.61–1.24)
GFR calc Af Amer: >60 mL/min (ref >60)
GFR calc non Af Amer: 52 mL/min — ABNORMAL LOW (ref >60)
GLUCOSE: 119 mg/dL — AB (ref 65–99)
POTASSIUM: 4.2 mmol/L (ref 3.5–5.1)
Sodium: 136 mmol/L (ref 135–145)

## 2015-05-25 LAB — GLUCOSE, CAPILLARY
GLUCOSE-CAPILLARY: 261 mg/dL — AB (ref 65–99)
Glucose-Capillary: 212 mg/dL — ABNORMAL HIGH (ref 65–99)

## 2015-05-25 MED ORDER — INSULIN ASPART 100 UNIT/ML ~~LOC~~ SOLN
0.0000 [IU] | Freq: Three times a day (TID) | SUBCUTANEOUS | Status: DC
  Administered 2015-05-25 – 2015-05-26 (×2): 3 [IU] via SUBCUTANEOUS
  Administered 2015-05-26: 1 [IU] via SUBCUTANEOUS
  Administered 2015-05-27: 3 [IU] via SUBCUTANEOUS
  Administered 2015-05-27 (×2): 2 [IU] via SUBCUTANEOUS

## 2015-05-25 MED ORDER — ENOXAPARIN SODIUM 40 MG/0.4ML ~~LOC~~ SOLN
40.0000 mg | SUBCUTANEOUS | Status: DC
  Administered 2015-05-25: 40 mg via SUBCUTANEOUS
  Filled 2015-05-25: qty 0.4

## 2015-05-25 NOTE — Progress Notes (Signed)
STROKE TEAM PROGRESS NOTE  Hx Lucas Morgan is an 73 y.o. male hx of HTN, DM, CAD, recent CVA 4 days ago presenting with worsening of his speech and right sided weakness. Per EMS he had a confirmed stroke on Tuesday for which he was treated at [facility]. He had aphasia and right sided weakness which did not resolve from that stroke. Today at 1300 family notes that his speech difficulties and weakness appeared to worsen. Though they note it seemed worse when he woke up. Family is not present/able to be reached so it is unclear how severe his symptoms were after recent stroke and how worse his symptoms are now.   CT head imaging reviewed with neuroradiology, shows well defined area of cytotoxic edema affecting the left superior frontal cortex and subcortical white matter consistent with a late acute cerebral infarction.   Date last known well: 05/20/2015 Time last known well: unclear tPA Given: no, recent CVA 4 days ago without resolution of symptoms, unclear last seen well Modified Rankin: unclear      SUBJECTIVE (INTERVAL HISTORY) Multiple family members present. Dr.Rhiana Morash answered all questions. The patient feels he is somewhat improved.   OBJECTIVE Temp:  [97.5 F (36.4 C)-97.8 F (36.6 C)] 97.7 F (36.5 C) (09/11 0800) Pulse Rate:  [76-97] 79 (09/11 0800) Cardiac Rhythm:  [-] Normal sinus rhythm (09/11 0700) Resp:  [12-23] 16 (09/11 0800) BP: (96-149)/(53-88) 120/72 mmHg (09/11 0800) SpO2:  [91 %-99 %] 99 % (09/11 0800) Weight:  [76.522 kg (168 lb 11.2 oz)] 76.522 kg (168 lb 11.2 oz) (09/10 2138)  CBC:  Recent Labs Lab 05/24/15 1740 05/24/15 1747 05/25/15 0705  WBC 14.1*  --  10.5  NEUTROABS 10.1*  --   --   HGB 15.4 15.6 16.0  HCT 43.2 46.0 43.6  MCV 89.3  --  87.7  PLT 214  --  208    Basic Metabolic Panel:  Recent Labs Lab 05/24/15 1740 05/24/15 1747 05/25/15 0705  NA 135 136 136  K 4.5 4.5 4.2  CL 103 100* 105  CO2 23  --  24  GLUCOSE 182*  181* 119*  BUN 37* 43* 26*  CREATININE 1.87* 1.80* 1.31*  CALCIUM 9.0  --  8.9    Lipid Panel:    Component Value Date/Time   CHOL 119 05/25/2015 0705   TRIG 129 05/25/2015 0705   HDL 33* 05/25/2015 0705   CHOLHDL 3.6 05/25/2015 0705   VLDL 26 05/25/2015 0705   LDLCALC 60 05/25/2015 0705   HgbA1c:  Lab Results  Component Value Date   HGBA1C * 12/21/2009    10.4 (NOTE) The ADA recommends the following therapeutic goal for glycemic control related to Hgb A1c measurement: Goal of therapy: <6.5 Hgb A1c  Reference: American Diabetes Association: Clinical Practice Recommendations 2010, Diabetes Care, 2010, 33: (Suppl  1).   Urine Drug Screen: No results found for: LABOPIA, COCAINSCRNUR, LABBENZ, AMPHETMU, THCU, LABBARB    IMAGING  Ct Head Wo Contrast 05/24/2015    Well-defined area of cytotoxic edema affecting the LEFT superior frontal cortex and subcortical white matter consistent with a late acute cerebral infarction. No features to suggest proximal vascular occlusion or hemorrhagic transformation.  Chronic changes as described.    MR MRA head/brain without contrast  05/25/2015 1. Acute/subacute nonhemorrhagic infarct involving the left frontal lobe to level of the precentral gyrus. 2. Additional smaller non confluent punctate areas of infarction within the more anterior left frontal lobe, the left parietal lobe, and  left occipital lobe seen to follow a watershed distribution.This suggests more proximal disease, potentially in the neck, or and episode of hypotension. 3. At least 4 punctate foci of acute nonhemorrhagic infarct are noted within the right parietal and occipital lobe. Given the bilateral distribution, a central embolic source also needs to be considered. 4. Age advanced atrophy and diffuse white matter disease. 5. Moderate right and mild left mid A1 segment stenoses. 6. Moderate distal small vessel disease, most evident within the right MCA branch  vessels.   Neurologic Exam  Mental Status: Alert, oriented, thought content appropriate.  Mild aphasia  Able to follow simple commands without difficulty. Cranial Nerves: II: Discs not visualized; Visual fields grossly normal, pupils equal, round, reactive to light and accommodation III,IV, VI: ptosis not present, extra-ocular motions intact bilaterally V,VII: Slight right facial droop, facial light touch sensation normal bilaterally VIII: hearing normal bilaterally IX,X: gag reflex present XI: bilateral shoulder shrug XII: midline tongue extension Motor: Right : Upper extremity   4/5    Left:     Upper extremity   5/5  Lower extremity   5/5     Lower extremity   5/5 Tone and bulk:normal tone throughout; no atrophy noted Sensory: Pinprick and light touch intact throughout, bilaterally Cerebellar: Unable to test Gait: Not tested    ASSESSMENT/PLAN [name] is a 74 y.o. male with history of diabetes mellitus, hyperlipidemia, coronary artery disease with previous MI, abdominal aortic aneurysm, renal insufficiency, and recent previous stroke presenting with worsening speech and right hemiparesis. He did not receive IV t-PA due to late presentation.  Strokes: Multiple infarcts possibly embolic of unknown etiology  Resultant  speech difficulties was mild right hemiparesis  MRI  multiple infarcts as noted above  MRA  moderate areas of stenosis  Carotid Doppler not ordered  2D Echo not ordered  LDL 60  HgbA1c pending  VTE prophylaxis - SCDs  Diet heart healthy/carb modified Room service appropriate?: Yes; Fluid consistency:: Thin  aspirin 81 mg orally every day prior to admission, now on aspirin 325 mg orally every day  Patient counseled to be compliant with his antithrombotic medications  Ongoing aggressive stroke risk factor management  Therapy recommendations: Pending  Disposition:  Pending  Hypertension  Occasionally low blood pressures otherwise  stable. Not currently on antihypertensives medications.  Permissive hypertension (OK if < 220/120) but gradually normalize in 5-7 days  Hyperlipidemia  Home meds:  Zocor 40 mg daily resumed in hospital  LDL 60, goal < 70  Continue statin at discharge  Diabetes  HgbA1c pending, goal < 7.0  Uncontrolled  Other Stroke Risk Factors  Advanced age  Cigarette smoker, quit smoking 21 years ago.  Hx stroke/TIA  Coronary artery disease   Other Active Problems  Renal insufficiency  Plan TEE and possible loop to look for embolic source - Message sent to cardiology. NPO after midnight.  Hospital day # 1  Delton See [facility] Pager [phone] 05/25/2015, 10:03 AM     To contact Stroke Continuity provider, please refer to [email or web address]. After hours, contact General Neurology

## 2015-05-25 NOTE — Progress Notes (Signed)
PATIENT DETAILS Name: Lucas Morgan Age: 74 y.o. Sex: male Date of Birth: 1940/12/14 Admit Date: 05/24/2015 Admitting Physician Therisa Doyne, MD ZOX:[id number], JENNY B, PA-C  Subjective: Dysarthric, no movement in right upper extremity.  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 But has bilateral acute infarcts. Continue aspirin, will likely need a TEE. LDL 60 (goal<70)-continue statin. A1c pending. Now with significant dysarthria and significant right upper extremity weakness.  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 Creatinine improved following hydration.  Type 2 diabetes: CBGs moderately well controlled-continue Lantus 30 units, add SSI  Dyslipidemia: Continue statin  Essential hypertension: Allow permissive hypertension-especially with extension of acute CVA. Hold all anti-hypertensives  Leukocytosis: Resolved, afebrile. Chest x-ray suggestive of right basilar infiltrate vs atelectasis-monitor off Abx.   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  MEDICATIONS: Scheduled Meds: . ALPRAZolam  0.5 mg Oral BID  . aspirin  300 mg Rectal Daily   Or  . aspirin  325 mg Oral Daily  . insulin glargine  30 Units Subcutaneous QHS  . simvastatin  40 mg Oral QHS   Continuous Infusions: .  sodium chloride 75 mL/hr at 05/24/15 2244   PRN Meds:.acetaminophen **OR** acetaminophen, HYDROcodone-acetaminophen, senna-docusate    PHYSICAL EXAM: Vital signs in last 24 hours: Filed Vitals:   05/25/15 0345 05/25/15 0545 05/25/15 0800 05/25/15 1032  BP: 116/58 123/64 120/72 128/73  Pulse: 82 76 79 90  Temp: 97.7 F (36.5 C) 97.8 F (36.6 C) 97.7 F (36.5 C) 97.8 F (36.6 C)  TempSrc: Oral Oral Oral Oral  Resp: 20 20 16 16   Weight:      SpO2: 97% 96% 99% 98%    Weight change:  Filed Weights   05/24/15 2138  Weight: 76.522 kg (168 lb 11.2 oz)   Body mass index is 24.9 kg/(m^2).   Gen Exam: Awake,but dysarthric Neck: Supple, No JVD. Chest: B/L Clear.   CVS: S1 S2 Regular, no murmurs.  Abdomen: soft, BS +, non tender, non distended.  Extremities: no edema, lower extremities warm to touch. Neurologic: RUE 0/5,RLE 4/5, LUE/LLE-5/5 Skin: No Rash.   Wounds: N/A.   Intake/Output from previous day:  Intake/Output Summary (Last 24 hours) at 05/25/15 1324 Last data filed at 05/25/15 0000  Gross per 24 hour  Intake    240 ml  Output      0 ml  Net    240 ml     LAB RESULTS: CBC  Recent Labs Lab 05/24/15 1740 05/24/15 1747 05/25/15 0705  WBC 14.1*  --  10.5  HGB 15.4 15.6 16.0  HCT 43.2 46.0 43.6  PLT 214  --  208  MCV 89.3  --  87.7  MCH 31.8  --  32.2  MCHC 35.6  --  36.7*  RDW 12.5  --  12.5  LYMPHSABS 2.4  --   --   MONOABS 1.4*  --   --   EOSABS 0.2  --   --   BASOSABS 0.0  --   --     Chemistries   Recent Labs Lab 05/24/15 1740 05/24/15 1747 05/25/15 0705  NA 135 136 136  K 4.5 4.5 4.2  CL 103 100* 105  CO2 23  --  24  GLUCOSE 182* 181* 119*  BUN 37* 43* 26*  CREATININE 1.87* 1.80* 1.31*  CALCIUM 9.0  --  8.9    CBG:  Recent Labs Lab 05/24/15 1741 05/24/15 2241  GLUCAP 187* 203*    GFR Estimated Creatinine Clearance: 50.2 mL/min (by C-G formula based on Cr of 1.31).  Coagulation profile  Recent Labs Lab 05/24/15 1740   INR 1.09    Cardiac Enzymes No results for input(s): CKMB, TROPONINI, MYOGLOBIN in the last 168 hours.  Invalid input(s): CK  Invalid input(s): POCBNP No results for input(s): DDIMER in the last 72 hours. No results for input(s): HGBA1C in the last 72 hours.  Recent Labs  05/25/15 0705  CHOL 119  HDL 33*  LDLCALC 60  TRIG 478  CHOLHDL 3.6   No results for input(s): TSH, T4TOTAL, T3FREE, THYROIDAB in the last 72 hours.  Invalid input(s): FREET3 No results for input(s): VITAMINB12, FOLATE, FERRITIN, TIBC, IRON, RETICCTPCT in the last 72 hours. No results for input(s): LIPASE, AMYLASE in the last 72 hours.  Urine Studies No results for input(s): UHGB, CRYS in the last 72 hours.  Invalid input(s): UACOL, UAPR, USPG, UPH, UTP, UGL, UKET, UBIL, UNIT, UROB, ULEU, UEPI, UWBC, URBC, UBAC, CAST, UCOM, BILUA  MICROBIOLOGY: No results found for this or any previous visit (from the past 240 hour(s)).  RADIOLOGY STUDIES/RESULTS: Dg Chest 2 View  05/25/2015   CLINICAL DATA:  Cough.  Recent stroke.  EXAM: CHEST  2 VIEW  COMPARISON:  10/18/2014  FINDINGS: The patient is mildly rotated to the right which partially limits evaluation of the mediastinum. Cardiomediastinal silhouette is grossly unchanged, with cardiac silhouette upper limits of normal in size. The patient has taken a slightly shallower inspiration than on the prior study. Chronic interstitial coarsening is again seen and may reflect underlying COPD/chronic bronchitis. There is slightly increased heterogeneous opacity in the right lung base which projects posteriorly on the lateral image. No pleural effusion or pneumothorax is identified. Degenerative changes are noted at the [facility] joints.  IMPRESSION: COPD with mild right basilar infiltrate versus atelectasis.   Electronically Signed   By: Sebastian Ache M.D.   On: 05/25/2015 12:17   Ct Head Wo Contrast  05/24/2015   CLINICAL DATA:  Code stroke. RIGHT-sided weakness. Symptoms began 4  days ago.  EXAM: CT HEAD WITHOUT CONTRAST  TECHNIQUE: Contiguous axial images were obtained from the base of the skull through the vertex without intravenous contrast.  COMPARISON:  CT head 06/10/2014.  MR brain 06/10/2014.  FINDINGS: Well-defined area of cytotoxic edema affects the LEFT superior frontal cortex and subcortical white matter consistent with an ischemic event onset 4 days ago. No hemorrhagic transformation. No other areas of acute infarction are suspected.  No CT findings to suggest proximal vascular thrombosis.  Mild atrophy. Chronic LEFT frontal and parietal subcortical infarction related to previous LEFT MCA territory ischemic insult documented on September 2015 MR. An additional LEFT parietal insult predated that stroke.  Hypoattenuation of the periventricular and subcortical regions consistent with small vessel disease.  No mass lesion, hydrocephalus, or extra-axial  fluid.  Calvarium intact.  No sinus or mastoid air fluid level.  IMPRESSION: Well-defined area of cytotoxic edema affecting the LEFT superior frontal cortex and subcortical white matter consistent with a late acute cerebral infarction. No features to suggest proximal vascular occlusion or hemorrhagic transformation.  Chronic changes as described.  Critical Value/emergent results were called by telephone at the time of interpretation on 05/24/2015 at 6:00 pm to Dr. Nelva Nay , who verbally acknowledged these results.   Electronically Signed   By: Elsie Stain M.D.   On: 05/24/2015 18:01   Mr Brain Wo Contrast  05/25/2015   CLINICAL DATA:  Recent left-sided infarct with right-sided weakness and aphasia. The weakness has improved but the aphasia continues. The patient was discharged from an outside hospital 2 days ago.  EXAM: MRI HEAD WITHOUT CONTRAST  MRA HEAD WITHOUT CONTRAST  TECHNIQUE: Multiplanar, multiecho pulse sequences of the brain and surrounding structures were obtained without intravenous contrast. Angiographic images  of the head were obtained using MRA technique without contrast.  COMPARISON:  None.  CT head without contrast 05/24/2015.  FINDINGS: MRI HEAD FINDINGS  The acute/subacute cortical and subcortical infarct in the high left frontal lobe is confirmed. This involves the [employer]. Additional scattered foci are present in the frontal lobe and parietal lobe on the left and what appears to be watershed distribution. There is additional involvement of the more superior and medial primary motor cortex. Portions of the inferior parietal and left occipital lobe are noted as well.  Additionally, at least 4 punctate areas of restricted diffusion are present in the right parietal and medial occipital lobe. There is no hemorrhage associated with these lesions. T2 changes are evident within the areas of acute/subacute infarct.  Moderate periventricular and subcortical T2 changes are additionally present bilaterally. Brainstem is unremarkable. Flow is present in the major intracranial arteries.  The study is moderately degraded by patient motion and could not be completed.  The left globe is collapsed with increased signal. The right globe is intact. The orbits are otherwise unremarkable. The paranasal sinuses and mastoid air cells are clear. Midline structures are unremarkable.  MRA HEAD FINDINGS  The internal carotid arteries are within normal limits from the high cervical segments through the ICA termini bilaterally. Moderate narrowing is present in the mid right A1 segment. More mild narrowing is present in the mid left A1 segment. The M1 segments are intact. The left ACA is duplicated. The anterior communicating artery appears to be patent. The MCA bifurcations are intact bilaterally. There is moderate attenuation of MCA branch vessels distally, right greater than left.  The right vertebral artery terminates at the PICA. The left vertebral artery is within normal limits. The PICA origin is visualized and normal. The  basilar artery is within normal limits. Both posterior cerebral arteries originate from the basilar tip. There is some attenuation of distal PCA branch vessels.  IMPRESSION: 1. Acute/subacute nonhemorrhagic infarct involving the left frontal lobe to level of the precentral gyrus. 2. Additional smaller non confluent punctate areas of infarction within the more anterior left frontal lobe, the left parietal lobe, and left occipital lobe seen to follow a watershed distribution. This suggests more proximal disease, potentially in the neck, or and episode of hypotension. 3. At least 4 punctate foci of acute nonhemorrhagic infarct are noted within the right parietal and occipital lobe. Given the bilateral distribution, a central embolic source also needs to be considered. 4. Age advanced atrophy and diffuse white matter disease. 5.  Moderate right and mild left mid A1 segment stenoses. 6. Moderate distal small vessel disease, most evident within the right MCA branch vessels.   Electronically Signed   By: Marin Roberts M.D.   On: 05/25/2015 10:41   Mr Lumbar Spine Wo Contrast  05/02/2015   CLINICAL DATA:  Chronic worsening low back pain with bilateral leg weakness and numbness.  EXAM: MRI LUMBAR SPINE WITHOUT CONTRAST  TECHNIQUE: Multiplanar, multisequence MR imaging of the lumbar spine was performed. No intravenous contrast was administered.  COMPARISON:  02/12/2013  FINDINGS: There are chronic L1 and L4 vertebral body compression fractures with remote vertebral body augmentation with methylmethacrylate within the vertebral bodies. There is 9 mm of retropulsion of the superior posterior margin of the L1 vertebral body. There is approximately 70% height loss of the L1 vertebral body. There is approximately 50% height loss of the L4 vertebral body.  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  The vertebral bodies of the lumbar spine are  normal in alignment. There is normal bone marrow signal demonstrated throughout the vertebra. The intervertebral disc spaces are well-maintained.  The spinal cord is normal in signal and contour. The cord terminates normally at L1 . The nerve roots of the cauda equina and the filum terminale are normal.  The visualized portions of the SI joints are unremarkable.  There is a mild infrarenal abdominal aortic aneurysm measuring 3 cm in AP diameter.  T12-L1: Mild eccentric left broad-based disc bulge. Retropulsion of the superior posterior margin of the L1 vertebral body flattens the ventral thecal sac and results in mild spinal stenosis. No evidence of neural foraminal stenosis.  L1-L2: Mild broad-based disc bulge. No evidence of neural foraminal stenosis. No central canal stenosis.  L2-L3: Mild broad-based disc bulge. No evidence of neural foraminal stenosis. No central canal stenosis.  L3-L4: Prior laminectomy. Mild broad-based disc bulge. Moderate right and mild left facet arthropathy. Moderate right foraminal stenosis. No left foraminal stenosis. No central canal stenosis.  L4-L5: Mild broad-based disc bulge. Moderate bilateral facet arthropathy with ligamentum flavum infolding resulting in moderate spinal stenosis and left lateral recess stenosis. Severe left foraminal stenosis. Mild right foraminal stenosis.  L5-S1: No significant disc bulge. No evidence of neural foraminal stenosis. No central canal stenosis. Moderate bilateral facet arthropathy.  IMPRESSION: 1. At T12-L1 there is a mild eccentric left broad-based disc bulge. Retropulsion of the superior posterior margin of the L1 vertebral body flattens the ventral thecal sac and results in mild spinal stenosis. 2. At L3-4 there is a mild broad-based disc bulge. Prior laminectomy. Moderate right and mild left facet arthropathy. Moderate right foraminal stenosis. 3. At L4-5 there is a mild broad-based disc bulge. Moderate bilateral facet arthropathy with  ligamentum flavum infolding resulting in moderate spinal stenosis and left lateral recess stenosis. Severe left foraminal stenosis. Mild right foraminal stenosis. 4. 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 5. 3 cm infrarenal abdominal aortic aneurysm.   Electronically Signed   By: Elige Ko   On: 05/02/2015 12:07   Mr Maxine Glenn Head/brain Wo Cm  05/25/2015   CLINICAL DATA:  Recent left-sided infarct with right-sided weakness and aphasia. The weakness has improved but the aphasia continues. The patient was discharged from an outside hospital 2 days ago.  EXAM: MRI HEAD WITHOUT CONTRAST  MRA HEAD WITHOUT CONTRAST  TECHNIQUE: Multiplanar, multiecho pulse sequences of the brain and surrounding structures were obtained without intravenous  contrast. Angiographic images of the head were obtained using MRA technique without contrast.  COMPARISON:  None.  CT head without contrast 05/24/2015.  FINDINGS: MRI HEAD FINDINGS  The acute/subacute cortical and subcortical infarct in the high left frontal lobe is confirmed. This involves the [employer]. Additional scattered foci are present in the frontal lobe and parietal lobe on the left and what appears to be watershed distribution. There is additional involvement of the more superior and medial primary motor cortex. Portions of the inferior parietal and left occipital lobe are noted as well.  Additionally, at least 4 punctate areas of restricted diffusion are present in the right parietal and medial occipital lobe. There is no hemorrhage associated with these lesions. T2 changes are evident within the areas of acute/subacute infarct.  Moderate periventricular and subcortical T2 changes are additionally present bilaterally. Brainstem is unremarkable. Flow is present in the major intracranial arteries.  The study is moderately degraded by patient motion and could not be completed.  The left globe is collapsed with increased signal. The right globe is  intact. The orbits are otherwise unremarkable. The paranasal sinuses and mastoid air cells are clear. Midline structures are unremarkable.  MRA HEAD FINDINGS  The internal carotid arteries are within normal limits from the high cervical segments through the ICA termini bilaterally. Moderate narrowing is present in the mid right A1 segment. More mild narrowing is present in the mid left A1 segment. The M1 segments are intact. The left ACA is duplicated. The anterior communicating artery appears to be patent. The MCA bifurcations are intact bilaterally. There is moderate attenuation of MCA branch vessels distally, right greater than left.  The right vertebral artery terminates at the PICA. The left vertebral artery is within normal limits. The PICA origin is visualized and normal. The basilar artery is within normal limits. Both posterior cerebral arteries originate from the basilar tip. There is some attenuation of distal PCA branch vessels.  IMPRESSION: 1. Acute/subacute nonhemorrhagic infarct involving the left frontal lobe to level of the precentral gyrus. 2. Additional smaller non confluent punctate areas of infarction within the more anterior left frontal lobe, the left parietal lobe, and left occipital lobe seen to follow a watershed distribution. This suggests more proximal disease, potentially in the neck, or and episode of hypotension. 3. At least 4 punctate foci of acute nonhemorrhagic infarct are noted within the right parietal and occipital lobe. Given the bilateral distribution, a central embolic source also needs to be considered. 4. Age advanced atrophy and diffuse white matter disease. 5. Moderate right and mild left mid A1 segment stenoses. 6. Moderate distal small vessel disease, most evident within the right MCA branch vessels.   Electronically Signed   By: Marin Roberts M.D.   On: 05/25/2015 10:41    Jeoffrey Massed, MD  [facility] Pager:[phone]  If 7PM-7AM, please  contact night-coverage [email or web address] Password [email or web address] 05/25/2015, 1:24 PM   LOS: 1 day

## 2015-05-25 NOTE — Progress Notes (Signed)
Patient arrived to 5M13 alert, oriented, and no complaints at 2130.  Family at the bedside until patient was settled.

## 2015-05-25 NOTE — Progress Notes (Signed)
Patient exhibiting neuro decline. MD notified.

## 2015-05-25 NOTE — Progress Notes (Signed)
OT Cancellation Note  Patient Details Name: Lucas Morgan MRN: [id number] DOB: 09-25-1940   Cancelled Treatment:    Reason Eval/Treat Not Completed: Patient at procedure or test/ unavailable  Earlie Raveling OTR/L [phone]  05/25/2015, 9:52 AM

## 2015-05-25 NOTE — Evaluation (Signed)
Physical Therapy Evaluation Patient Details Name: GAYLE COLLARD MRN: [id number] DOB: Apr 17, 1941 Today's Date: 05/25/2015   History of Present Illness  74 y.o. 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 But has bilateral acute infarcts.  Clinical Impression  Pt admitted with the above complications. Pt currently with functional limitations due to the deficits listed below (see PT Problem List). Non-verbal throughout therapy session. Requires extra time and cues to follow directions. Pt with ataxic gait, demonstrating scissoring and loss of balance requiring mod assist to prevent falls at times. Pt will benefit from skilled PT to increase their independence and safety with mobility to allow discharge to the venue listed below.       Follow Up Recommendations CIR    Equipment Recommendations   (TBD at next venue of care)    Recommendations for Other Services Rehab consult     Precautions / Restrictions Precautions Precautions: Fall Restrictions Weight Bearing Restrictions: No      Mobility  Bed Mobility Overal bed mobility: Needs Assistance Bed Mobility: Supine to Sit     Supine to sit: Min guard     General bed mobility comments: Min guard for safety. Cues for use of RUE as able.  Did not require physical assist but uses rail.  Transfers Overall transfer level: Needs assistance Equipment used: 1 person hand held assist Transfers: Sit to/from Stand Sit to Stand: Min assist         General transfer comment: Min assist for balance upon rising, Moderate sway, reaching for rail.   Ambulation/Gait Ambulation/Gait assistance: Mod assist Ambulation Distance (Feet): 75 Feet Assistive device: 1 person hand held assist Gait Pattern/deviations: Step-through pattern;Decreased stride length;Scissoring;Ataxic;Staggering left;Staggering right;Narrow base of support Gait velocity: Decreased Gait velocity interpretation: Below normal speed for  age/gender General Gait Details: Most of bout at min assist level for stability however a couple of instances of staggering and LOB to left and right requiring mod assist to correct. Cues for awareness and correction with wide base of support, especially with turns.   Stairs            Wheelchair Mobility    Modified Rankin (Stroke Patients Only) Modified Rankin (Stroke Patients Only) Pre-Morbid Rankin Score: Moderately severe disability Modified Rankin: Moderately severe disability     Balance Overall balance assessment: Needs assistance Sitting-balance support: No upper extremity supported;Feet supported Sitting balance-Leahy Scale: Good     Standing balance support: No upper extremity supported Standing balance-Leahy Scale: Fair                               Pertinent Vitals/Pain Pain Assessment: Faces Faces Pain Scale: Hurts little more Pain Location: abdomen Pain Intervention(s): Monitored during session;Repositioned    Home Living Family/patient expects to be discharged to:: Unsure Living Arrangements: Children (daughter) Available Help at Discharge: Family;Available 24 hours/day (initially) Type of Home: House Home Access: Level entry     Home Layout: One level Home Equipment: Cane - single point;Shower seat      Prior Function Level of Independence: Independent               Hand Dominance   Dominant Hand: Right    Extremity/Trunk Assessment   Upper Extremity Assessment: Defer to OT evaluation           Lower Extremity Assessment: RLE deficits/detail;Difficult to assess due to impaired cognition RLE Deficits / Details: Grossly 4/5 strength compared to LLE  at 5/5.       Communication   Communication: Expressive difficulties  Cognition Arousal/Alertness: Awake/alert Behavior During Therapy: WFL for tasks assessed/performed Overall Cognitive Status: Difficult to assess Area of Impairment: Problem solving;Following  commands       Following Commands: Follows one step commands with increased time     Problem Solving: Slow processing;Difficulty sequencing;Requires verbal cues      General Comments General comments (skin integrity, edema, etc.): aphasic most information obtained from family. Pt with abdominal pain, RN notified.    Exercises        Assessment/Plan    PT Assessment Patient needs continued PT services  PT Diagnosis Difficulty walking;Abnormality of gait;Hemiplegia dominant side;Acute pain   PT Problem List Decreased strength;Decreased range of motion;Decreased balance;Decreased activity tolerance;Decreased mobility;Decreased coordination;Decreased cognition;Decreased knowledge of use of DME;Decreased safety awareness;Decreased knowledge of precautions;Pain  PT Treatment Interventions DME instruction;Gait training;Functional mobility training;Therapeutic activities;Therapeutic exercise;Balance training;Neuromuscular re-education;Cognitive remediation;Patient/family education   PT Goals (Current goals can be found in the Care Plan section) Acute Rehab PT Goals Patient Stated Goal: none stated PT Goal Formulation: With patient/family Time For Goal Achievement: 06/08/15 Potential to Achieve Goals: Good    Frequency Min 4X/week   Barriers to discharge        Co-evaluation               End of Session Equipment Utilized During Treatment: Gait belt Activity Tolerance: Patient tolerated treatment well Patient left: in chair;with call bell/phone within reach;with chair alarm set;with family/visitor present Nurse Communication: Mobility status;Precautions         Time: 1610-9604 PT Time Calculation (min) (ACUTE ONLY): 17 min   Charges:   PT Evaluation $Initial PT Evaluation Tier I: 1 Procedure     PT G CodesBerton Mount 05/25/2015, 4:39 PM Sunday Spill[place], [place] [phone]

## 2015-05-26 ENCOUNTER — Inpatient Hospital Stay (HOSPITAL_COMMUNITY)
Admit: 2015-05-26 | Discharge: 2015-05-26 | Disposition: A | Discharge status: Home / Self Care | Payer: Medicare Other | Attending: Physician Assistant | Admitting: Physician Assistant

## 2015-05-26 ENCOUNTER — Encounter (HOSPITAL_COMMUNITY)
Admission: EM | Disposition: A | Discharge status: Rehabilitation Facility | Payer: Self-pay | Source: Home / Self Care | Attending: Internal Medicine

## 2015-05-26 ENCOUNTER — Encounter (HOSPITAL_COMMUNITY): Payer: Self-pay | Admitting: *Deleted

## 2015-05-26 DIAGNOSIS — I63419 Cerebral infarction due to embolism of unspecified middle cerebral artery: Secondary | ICD-10-CM

## 2015-05-26 DIAGNOSIS — I639 Cerebral infarction, unspecified: Secondary | ICD-10-CM

## 2015-05-26 DIAGNOSIS — N189 Chronic kidney disease, unspecified: Secondary | ICD-10-CM

## 2015-05-26 DIAGNOSIS — R4701 Aphasia: Secondary | ICD-10-CM

## 2015-05-26 DIAGNOSIS — I2510 Atherosclerotic heart disease of native coronary artery without angina pectoris: Secondary | ICD-10-CM

## 2015-05-26 DIAGNOSIS — N182 Chronic kidney disease, stage 2 (mild): Secondary | ICD-10-CM

## 2015-05-26 DIAGNOSIS — N179 Acute kidney failure, unspecified: Secondary | ICD-10-CM

## 2015-05-26 DIAGNOSIS — E1122 Type 2 diabetes mellitus with diabetic chronic kidney disease: Secondary | ICD-10-CM

## 2015-05-26 HISTORY — PX: TEE WITHOUT CARDIOVERSION: SHX5443

## 2015-05-26 HISTORY — PX: EP IMPLANTABLE DEVICE: SHX172B

## 2015-05-26 LAB — HEMOGLOBIN A1C
HEMOGLOBIN A1C: 7.5 % — AB (ref 4.8–5.6)
MEAN PLASMA GLUCOSE: 169 mg/dL

## 2015-05-26 LAB — GLUCOSE, CAPILLARY
GLUCOSE-CAPILLARY: 107 mg/dL — AB (ref 65–99)
GLUCOSE-CAPILLARY: 243 mg/dL — AB (ref 65–99)
Glucose-Capillary: 123 mg/dL — ABNORMAL HIGH (ref 65–99)
Glucose-Capillary: 242 mg/dL — ABNORMAL HIGH (ref 65–99)

## 2015-05-26 LAB — CBC
HCT: 45.7 % (ref 39.0–52.0)
Hemoglobin: 16.3 g/dL (ref 13.0–17.0)
MCH: 32 pg (ref 26.0–34.0)
MCHC: 35.7 g/dL (ref 30.0–36.0)
MCV: 89.8 fL (ref 78.0–100.0)
PLATELETS: 201 10*3/uL (ref 150–400)
RBC: 5.09 MIL/uL (ref 4.22–5.81)
RDW: 12.3 % (ref 11.5–15.5)
WBC: 11.6 10*3/uL — ABNORMAL HIGH (ref 4.0–10.5)

## 2015-05-26 LAB — CREATININE, SERUM
CREATININE: 1.39 mg/dL — AB (ref 0.61–1.24)
GFR calc Af Amer: 56 mL/min — ABNORMAL LOW (ref >60)
GFR calc non Af Amer: 49 mL/min — ABNORMAL LOW (ref >60)

## 2015-05-26 SURGERY — LOOP RECORDER INSERTION

## 2015-05-26 SURGERY — ECHOCARDIOGRAM, TRANSESOPHAGEAL
Anesthesia: Moderate Sedation

## 2015-05-26 MED ORDER — SODIUM CHLORIDE 0.9 % IV SOLN
INTRAVENOUS | Status: DC
  Administered 2015-05-26: 10:00:00 via INTRAVENOUS

## 2015-05-26 MED ORDER — BUTAMBEN-TETRACAINE-BENZOCAINE 2-2-14 % EX AERO
INHALATION_SPRAY | CUTANEOUS | Status: DC | PRN
  Administered 2015-05-26: 2 via TOPICAL

## 2015-05-26 MED ORDER — DIPHENHYDRAMINE HCL 50 MG/ML IJ SOLN
INTRAMUSCULAR | Status: AC
  Filled 2015-05-26: qty 1

## 2015-05-26 MED ORDER — ONDANSETRON HCL 4 MG/2ML IJ SOLN: 4.0000 mg | Freq: Four times a day (QID) | INTRAMUSCULAR | Status: DC | PRN

## 2015-05-26 MED ORDER — LIDOCAINE-EPINEPHRINE 1 %-1:100000 IJ SOLN
INTRAMUSCULAR | Status: AC
  Filled 2015-05-26: qty 1

## 2015-05-26 MED ORDER — LIDOCAINE-EPINEPHRINE 1 %-1:100000 IJ SOLN
INTRAMUSCULAR | Status: DC | PRN
  Administered 2015-05-26: 10 mL

## 2015-05-26 MED ORDER — MIDAZOLAM HCL 5 MG/ML IJ SOLN
INTRAMUSCULAR | Status: AC
  Filled 2015-05-26: qty 2

## 2015-05-26 MED ORDER — MIDAZOLAM HCL 10 MG/2ML IJ SOLN
INTRAMUSCULAR | Status: DC | PRN
  Administered 2015-05-26: 2 mg via INTRAVENOUS

## 2015-05-26 MED ORDER — ACETAMINOPHEN 325 MG PO TABS
325.0000 mg | ORAL_TABLET | ORAL | Status: DC | PRN
Start: 2015-05-26 — End: 2015-05-26

## 2015-05-26 MED ORDER — HEPARIN SODIUM (PORCINE) 5000 UNIT/ML IJ SOLN
5000.0000 [IU] | Freq: Three times a day (TID) | INTRAMUSCULAR | Status: DC
  Administered 2015-05-26 (×2): 5000 [IU] via SUBCUTANEOUS
  Filled 2015-05-26 (×3): qty 1

## 2015-05-26 MED ORDER — FENTANYL CITRATE (PF) 100 MCG/2ML IJ SOLN
INTRAMUSCULAR | Status: DC | PRN
  Administered 2015-05-26: 25 ug via INTRAVENOUS

## 2015-05-26 MED ORDER — FENTANYL CITRATE (PF) 100 MCG/2ML IJ SOLN
INTRAMUSCULAR | Status: AC
  Filled 2015-05-26: qty 2

## 2015-05-26 SURGICAL SUPPLY — 2 items
LOOP REVEAL LINQSYS (Prosthesis & Implant Heart) ×3 IMPLANT
PACK LOOP INSERTION (CUSTOM PROCEDURE TRAY) ×3 IMPLANT

## 2015-05-26 NOTE — Consult Note (Signed)
Physical Medicine and Rehabilitation Consult   Reason for Consult: Ataxic gait and slurred speech Referring Physician:  Dr. Carolin Coy.    HPI: Lucas Morgan is a 74 y.o. 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  He was readmitted on  June 19, 2015 with pronounced right sided weakness and garbled speech. CT head revealed well defined area of cytotoxic edema left superior frontal cortex and subcortical white matter.   MRI/MRA brain done revealing acute/subacute hon-hemorrhagic infarct left frontal and additional areas left parietal and left occipital  (question hypoperfusion), right parietal and right occipital lobes with question of embolic source.  Age advanced atrophy with moderate distal SVD most evident in R-MCA branches.  Neurology consulted and recommended TEE as well as loop recorder for work up of embolic source. He is to continue on ASA for secondary stroke prevention.  PT evaluations done this weekend and CIR recommended for follow up therapy.    Review of Systems  Unable to perform ROS: language  Respiratory: Negative for shortness of breath.   Cardiovascular: Negative for chest pain.  Neurological: Positive for speech change and focal weakness.  Psychiatric/Behavioral: Positive for depression. The patient is nervous/anxious.       Past Medical History  Diagnosis Date  . Diabetes mellitus   . Dyslipidemia   . MI (myocardial infarction) feb 27/10  . AAA (abdominal aortic aneurysm)     a. Abd U/S 7/14: mild aneurysmal dilatation 3x3 cm; cholelithiasis without evid of cholecystitis => repeat 1 year  . Coronary artery disease   . Arthritis     stenosis, lumbar region  . Renal insufficiency   . Stroke     Past Surgical History  Procedure Laterality Date  . Knee surgery    . Coronary stent placement    . Back  surgery  2015    lumbar fusion  . Joint replacement Left   . 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 Lumbar laminectomy/decompression microdiscectomy N/A 10/18/2014    Procedure: LUMBAR LAMINECTOMY/DECOMPRESSION MICRODISCECTOMYLUMBAR THREE-FOUR ;  Surgeon: Temple Pacini, MD;  Location: [facility];  Service: Neurosurgery;  Laterality: N/A;  . Coronary angioplasty      Family History  Problem Relation Age of Onset  . Heart attack Brother     x2 brothers  . CAD Brother     Social History:  Indicated that he lives with a daughter. Per reports that he quit smoking about 21 years ago. He has never used smokeless tobacco. Per reports that he does not drink alcohol or use illicit drugs.     Allergies  Allergen Reactions  . Trazodone And Nefazodone Other (See Comments)    High blood sugar    Medications Prior to Admission  Medication Sig Dispense Refill  . ALPRAZolam (XANAX) 0.5 MG tablet Take 0.5 mg by mouth 2 (two) times daily.   0  . aspirin 81 MG tablet Take 81 mg by mouth daily.      Marland Kitchen glipiZIDE (GLUCOTROL) 10 MG tablet Take 10 mg by mouth daily before breakfast.     . HYDROcodone-acetaminophen (NORCO) 10-325 MG per tablet Take 1 tablet by mouth every 6 (six) hours as needed (pain). 60 tablet 0  . LANTUS SOLOSTAR 100 UNIT/ML Solostar Pen Inject 75 Units into  the skin at bedtime.   0  . lisinopril (PRINIVIL,ZESTRIL) 2.5 MG tablet Take 2.5 mg by mouth daily.    . metroNIDAZOLE (FLAGYL) 500 MG tablet Take 500 mg by mouth 3 (three) times daily.     . simvastatin (ZOCOR) 40 MG tablet Take 40 mg by mouth at bedtime.  6  . ondansetron (ZOFRAN) 4 MG tablet Take 1 tablet (4 mg total) by mouth every 6 (six) hours. (Patient not taking: Reported on 04/08/2015) 12 tablet 0    Home: Home Living Family/patient expects to be discharged to:: Unsure Living Arrangements: Children (daughter) Available Help at Discharge: Family, Available 24 hours/day (initially) Type  of Home: House Home Access: Level entry Home Layout: One level Home Equipment: Cane - single point, Banker History: Prior Function Level of Independence: Independent Functional Status:  Mobility: Bed Mobility Overal bed mobility: Needs Assistance Bed Mobility: Supine to Sit Supine to sit: Min guard General bed mobility comments: Min guard for safety. Cues for use of RUE as able.  Did not require physical assist but uses rail. Transfers Overall transfer level: Needs assistance Equipment used: 1 person hand held assist Transfers: Sit to/from Stand Sit to Stand: Min assist General transfer comment: Min assist for balance upon rising, Moderate sway, reaching for rail.  Ambulation/Gait Ambulation/Gait assistance: Mod assist Ambulation Distance (Feet): 75 Feet Assistive device: 1 person hand held assist Gait Pattern/deviations: Step-through pattern, Decreased stride length, Scissoring, Ataxic, Staggering left, Staggering right, Narrow base of support General Gait Details: Most of bout at min assist level for stability however a couple of instances of staggering and LOB to left and right requiring mod assist to correct. Cues for awareness and correction with wide base of support, especially with turns.  Gait velocity: Decreased Gait velocity interpretation: Below normal speed for age/gender    ADL:    Cognition: Cognition Overall Cognitive Status: Difficult to assess Orientation Level: Oriented X4 (able to nod yes and no) Cognition Arousal/Alertness: Awake/alert Behavior During Therapy: WFL for tasks assessed/performed Overall Cognitive Status: Difficult to assess Area of Impairment: Problem solving, Following commands Following Commands: Follows one step commands with increased time Problem Solving: Slow processing, Difficulty sequencing, Requires verbal cues Difficult to assess due to: Impaired communication  Blood pressure 128/68, pulse 83, temperature 98.9  F (37.2 C), temperature source Oral, resp. rate 18, weight 76.522 kg (168 lb 11.2 oz), SpO2 98 %. Physical Exam  Nursing note and vitals reviewed. Constitutional: He is oriented to person, place, and time. He appears well-developed and well-nourished.  Tearful during the exam and needed redirection.    HENT:  Head: Normocephalic and atraumatic.  Eyes: Conjunctivae are normal. Pupils are equal, round, and reactive to light.  Neck: Normal range of motion. Neck supple.  Cardiovascular: Normal rate and regular rhythm.   Respiratory: Effort normal and breath sounds normal. No respiratory distress.  GI: Soft. Bowel sounds are normal. He exhibits no distension. There is no tenderness.  Musculoskeletal: He exhibits no edema or tenderness.  Neurological: He is alert and oriented to person, place, and time.  Expressive aphasia with dysarthria. Able to answer simple Y/N questions only. Emotional. He was distracted and impulsive needing redirection to attend to task. Able to follow simple motor commands but breaks down with 2 step commands. Right sided weakness with question right inattention.   Skin: Skin is warm and dry. No erythema.  Psychiatric: His speech is slurred. He expresses impulsivity. He exhibits a depressed mood.    Results for  orders placed or performed during the hospital encounter of 05/24/15 (from the past 24 hour(s))  Glucose, capillary     Status: Abnormal   Collection Time: 05/25/15  4:33 PM  Result Value Ref Range   Glucose-Capillary 212 (H) 65 - 99 mg/dL  Glucose, capillary     Status: Abnormal   Collection Time: 05/25/15  9:52 PM  Result Value Ref Range   Glucose-Capillary 261 (H) 65 - 99 mg/dL   Comment 1 Notify RN    Comment 2 Document in Chart   Glucose, capillary     Status: Abnormal   Collection Time: 05/26/15  6:30 AM  Result Value Ref Range   Glucose-Capillary 123 (H) 65 - 99 mg/dL   Comment 1 Notify RN    Comment 2 Document in Chart    Dg Chest 2  View  05/25/2015   CLINICAL DATA:  Cough.  Recent stroke.  EXAM: CHEST  2 VIEW  COMPARISON:  10/18/2014  FINDINGS: The patient is mildly rotated to the right which partially limits evaluation of the mediastinum. Cardiomediastinal silhouette is grossly unchanged, with cardiac silhouette upper limits of normal in size. The patient has taken a slightly shallower inspiration than on the prior study. Chronic interstitial coarsening is again seen and may reflect underlying COPD/chronic bronchitis. There is slightly increased heterogeneous opacity in the right lung base which projects posteriorly on the lateral image. No pleural effusion or pneumothorax is identified. Degenerative changes are noted at the [facility] joints.  IMPRESSION: COPD with mild right basilar infiltrate versus atelectasis.   Electronically Signed   By: Sebastian Ache M.D.   On: 05/25/2015 12:17   Ct Head Wo Contrast  05/24/2015   CLINICAL DATA:  Code stroke. RIGHT-sided weakness. Symptoms began 4 days ago.  EXAM: CT HEAD WITHOUT CONTRAST  TECHNIQUE: Contiguous axial images were obtained from the base of the skull through the vertex without intravenous contrast.  COMPARISON:  CT head 06/10/2014.  MR brain 06/10/2014.  FINDINGS: Well-defined area of cytotoxic edema affects the LEFT superior frontal cortex and subcortical white matter consistent with an ischemic event onset 4 days ago. No hemorrhagic transformation. No other areas of acute infarction are suspected.  No CT findings to suggest proximal vascular thrombosis.  Mild atrophy. Chronic LEFT frontal and parietal subcortical infarction related to previous LEFT MCA territory ischemic insult documented on September 2015 MR. An additional LEFT parietal insult predated that stroke.  Hypoattenuation of the periventricular and subcortical regions consistent with small vessel disease.  No mass lesion, hydrocephalus, or extra-axial fluid.  Calvarium intact.  No sinus or mastoid air fluid level.  IMPRESSION:  Well-defined area of cytotoxic edema affecting the LEFT superior frontal cortex and subcortical white matter consistent with a late acute cerebral infarction. No features to suggest proximal vascular occlusion or hemorrhagic transformation.  Chronic changes as described.  Critical Value/emergent results were called by telephone at the time of interpretation on 05/24/2015 at 6:00 pm to Dr. Nelva Nay , who verbally acknowledged these results.   Electronically Signed   By: Elsie Stain M.D.   On: 05/24/2015 18:01   Mr Brain Wo Contrast  05/25/2015   CLINICAL DATA:  Recent left-sided infarct with right-sided weakness and aphasia. The weakness has improved but the aphasia continues. The patient was discharged from an outside hospital 2 days ago.  EXAM: MRI HEAD WITHOUT CONTRAST  MRA HEAD WITHOUT CONTRAST  TECHNIQUE: Multiplanar, multiecho pulse sequences of the brain and surrounding structures were obtained without intravenous contrast. Angiographic  images of the head were obtained using MRA technique without contrast.  COMPARISON:  None.  CT head without contrast 05/24/2015.  FINDINGS: MRI HEAD FINDINGS  The acute/subacute cortical and subcortical infarct in the high left frontal lobe is confirmed. This involves the [employer]. Additional scattered foci are present in the frontal lobe and parietal lobe on the left and what appears to be watershed distribution. There is additional involvement of the more superior and medial primary motor cortex. Portions of the inferior parietal and left occipital lobe are noted as well.  Additionally, at least 4 punctate areas of restricted diffusion are present in the right parietal and medial occipital lobe. There is no hemorrhage associated with these lesions. T2 changes are evident within the areas of acute/subacute infarct.  Moderate periventricular and subcortical T2 changes are additionally present bilaterally. Brainstem is unremarkable. Flow is present in the  major intracranial arteries.  The study is moderately degraded by patient motion and could not be completed.  The left globe is collapsed with increased signal. The right globe is intact. The orbits are otherwise unremarkable. The paranasal sinuses and mastoid air cells are clear. Midline structures are unremarkable.  MRA HEAD FINDINGS  The internal carotid arteries are within normal limits from the high cervical segments through the ICA termini bilaterally. Moderate narrowing is present in the mid right A1 segment. More mild narrowing is present in the mid left A1 segment. The M1 segments are intact. The left ACA is duplicated. The anterior communicating artery appears to be patent. The MCA bifurcations are intact bilaterally. There is moderate attenuation of MCA branch vessels distally, right greater than left.  The right vertebral artery terminates at the PICA. The left vertebral artery is within normal limits. The PICA origin is visualized and normal. The basilar artery is within normal limits. Both posterior cerebral arteries originate from the basilar tip. There is some attenuation of distal PCA branch vessels.  IMPRESSION: 1. Acute/subacute nonhemorrhagic infarct involving the left frontal lobe to level of the precentral gyrus. 2. Additional smaller non confluent punctate areas of infarction within the more anterior left frontal lobe, the left parietal lobe, and left occipital lobe seen to follow a watershed distribution. This suggests more proximal disease, potentially in the neck, or and episode of hypotension. 3. At least 4 punctate foci of acute nonhemorrhagic infarct are noted within the right parietal and occipital lobe. Given the bilateral distribution, a central embolic source also needs to be considered. 4. Age advanced atrophy and diffuse white matter disease. 5. Moderate right and mild left mid A1 segment stenoses. 6. Moderate distal small vessel disease, most evident within the right MCA branch  vessels.   Electronically Signed   By: Marin Roberts M.D.   On: 05/25/2015 10:41   Mr Maxine Glenn Head/brain Wo Cm  05/25/2015   CLINICAL DATA:  Recent left-sided infarct with right-sided weakness and aphasia. The weakness has improved but the aphasia continues. The patient was discharged from an outside hospital 2 days ago.  EXAM: MRI HEAD WITHOUT CONTRAST  MRA HEAD WITHOUT CONTRAST  TECHNIQUE: Multiplanar, multiecho pulse sequences of the brain and surrounding structures were obtained without intravenous contrast. Angiographic images of the head were obtained using MRA technique without contrast.  COMPARISON:  None.  CT head without contrast 05/24/2015.  FINDINGS: MRI HEAD FINDINGS  The acute/subacute cortical and subcortical infarct in the high left frontal lobe is confirmed. This involves the [employer]. Additional scattered foci are present in the frontal  lobe and parietal lobe on the left and what appears to be watershed distribution. There is additional involvement of the more superior and medial primary motor cortex. Portions of the inferior parietal and left occipital lobe are noted as well.  Additionally, at least 4 punctate areas of restricted diffusion are present in the right parietal and medial occipital lobe. There is no hemorrhage associated with these lesions. T2 changes are evident within the areas of acute/subacute infarct.  Moderate periventricular and subcortical T2 changes are additionally present bilaterally. Brainstem is unremarkable. Flow is present in the major intracranial arteries.  The study is moderately degraded by patient motion and could not be completed.  The left globe is collapsed with increased signal. The right globe is intact. The orbits are otherwise unremarkable. The paranasal sinuses and mastoid air cells are clear. Midline structures are unremarkable.  MRA HEAD FINDINGS  The internal carotid arteries are within normal limits from the high cervical segments  through the ICA termini bilaterally. Moderate narrowing is present in the mid right A1 segment. More mild narrowing is present in the mid left A1 segment. The M1 segments are intact. The left ACA is duplicated. The anterior communicating artery appears to be patent. The MCA bifurcations are intact bilaterally. There is moderate attenuation of MCA branch vessels distally, right greater than left.  The right vertebral artery terminates at the PICA. The left vertebral artery is within normal limits. The PICA origin is visualized and normal. The basilar artery is within normal limits. Both posterior cerebral arteries originate from the basilar tip. There is some attenuation of distal PCA branch vessels.  IMPRESSION: 1. Acute/subacute nonhemorrhagic infarct involving the left frontal lobe to level of the precentral gyrus. 2. Additional smaller non confluent punctate areas of infarction within the more anterior left frontal lobe, the left parietal lobe, and left occipital lobe seen to follow a watershed distribution. This suggests more proximal disease, potentially in the neck, or and episode of hypotension. 3. At least 4 punctate foci of acute nonhemorrhagic infarct are noted within the right parietal and occipital lobe. Given the bilateral distribution, a central embolic source also needs to be considered. 4. Age advanced atrophy and diffuse white matter disease. 5. Moderate right and mild left mid A1 segment stenoses. 6. Moderate distal small vessel disease, most evident within the right MCA branch vessels.   Electronically Signed   By: Marin Roberts M.D.   On: 05/25/2015 10:41    Assessment/Plan: Diagnosis: embolic cerebral infarcts  1. Does the need for close, 24 hr/day medical supervision in concert with the patient's rehab needs make it unreasonable for this patient to be served in a less intensive setting? Yes 2. Co-Morbidities requiring supervision/potential complications: dm2, cad, 3. Due to  bladder management, bowel management, safety, skin/wound care, disease management, medication administration, pain management and patient education, does the patient require 24 hr/day rehab nursing? Yes 4. Does the patient require coordinated care of a physician, rehab nurse, PT (1-2 hrs/day, 5 days/week), OT (1-2 hrs/day, 5 days/week) and SLP (1-2 hrs/day, 5 days/week) to address physical and functional deficits in the context of the above medical diagnosis(es)? Yes Addressing deficits in the following areas: balance, endurance, locomotion, strength, transferring, bowel/bladder control, bathing, dressing, feeding, grooming, toileting, cognition and psychosocial support 5. Can the patient actively participate in an intensive therapy program of at least 3 hrs of therapy per day at least 5 days per week? Yes 6. The potential for patient to make measurable gains while on inpatient  rehab is excellent 7. Anticipated functional outcomes upon discharge from inpatient rehab are supervision  with PT, supervision and min assist with OT, supervision and min assist with SLP. 8. Estimated rehab length of stay to reach the above functional goals is: 14-18 days 9. Does the patient have adequate social supports and living environment to accommodate these discharge functional goals? Yes and Potentially 10. Anticipated D/C setting: Home 11. Anticipated post D/C treatments: HH therapy and Outpatient therapy 12. Overall Rehab/Functional Prognosis: excellent  RECOMMENDATIONS: This patient's condition is appropriate for continued rehabilitative care in the following setting: CIR Patient has agreed to participate in recommended program. Potentially Note that insurance prior authorization may be required for reimbursement for recommended care.  Comment: Rehab Admissions Coordinator to follow up.  Thanks,  Ranelle Oyster, MD, [place] [facility]     05/26/2015

## 2015-05-26 NOTE — Care Management Note (Signed)
Case Management Note  Patient Details  Name: Lucas Morgan MRN: [id number] Date of Birth: March 01, 1941  Subjective/Objective:                    Action/Plan: Pt admitted with extension of previous CVA and new CVA. Pt is from home with family. CM will cont to follow for discharge needs.   Expected Discharge Date:                  Expected Discharge Plan:  Home/Self Care  In-House Referral:     Discharge planning Services     Post Acute Care Choice:    Choice offered to:     DME Arranged:    DME Agency:     HH Arranged:    HH Agency:     Status of Service:  In process, will continue to follow  Medicare Important Message Given:    Date Medicare IM Given:    Medicare IM give by:    Date Additional Medicare IM Given:    Additional Medicare Important Message give by:     If discussed at Long Length of Stay Meetings, dates discussed:    Additional Comments:  Florene Glen, RN 05/26/2015, 11:16 AM

## 2015-05-26 NOTE — Progress Notes (Signed)
Physical Therapy Treatment Patient Details Name: Lucas Morgan MRN: [id number] DOB: [birth year] Today's Date: 05/26/2015    History of Present Illness 74 y.o. 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 But has bilateral acute infarcts.    PT Comments    Pt con't to present with severe expressive aphasia, R sided weakness, impaired balance/increased falls risk, and impaired comprehension requiring min/modA for safe OOB mobility due freq LOB. Con't to recommend CIR upon d/c for maximal functional recovery to decrease burden of care on family.  Follow Up Recommendations  CIR     Equipment Recommendations       Recommendations for Other Services Rehab consult     Precautions / Restrictions Precautions Precautions: Fall Restrictions Weight Bearing Restrictions: No    Mobility  Bed Mobility Overal bed mobility: Needs Assistance Bed Mobility: Sit to Supine       Sit to supine: Min guard   General bed mobility comments: sitting up in chair  Transfers Overall transfer level: Needs assistance Equipment used: 1 person hand held assist Transfers: Sit to/from Stand Sit to Stand: Min assist Stand pivot transfers: Mod assist;Min assist       General transfer comment: pt requires min/modA when turning due to LOB  Ambulation/Gait Ambulation/Gait assistance: Min assist;Mod assist Ambulation Distance (Feet): 150 Feet Assistive device: 1 person hand held assist Gait Pattern/deviations: Step-through pattern Gait velocity: Decreased   General Gait Details: pt with decreased R LE foot clearance with staggering to the R, worsening with increase distance due to onset of fatigue. Pt with scissored gait pattern occasionally to correct balance.   Stairs Stairs: Yes Stairs assistance: Min assist Stair Management: Two rails Number of Stairs: 10 General stair comments: requires 2 rails and minA to maintain balance due to difficulty sequencing  Wheelchair  Mobility    Modified Rankin (Stroke Patients Only) Modified Rankin (Stroke Patients Only) Pre-Morbid Rankin Score: Moderately severe disability Modified Rankin: Moderately severe disability     Balance Overall balance assessment: Needs assistance Sitting-balance support: Feet supported Sitting balance-Leahy Scale: Fair Sitting balance - Comments: Pt occasionally loses balance to Rt when attempting to don/doff socks    Standing balance support: During functional activity Standing balance-Leahy Scale: Poor Standing balance comment: Pt requires min guard to min A for standing balance.  Intermittently loses balance during grooming and dressing tasks                     Cognition Arousal/Alertness: Awake/alert Behavior During Therapy: Impulsive Overall Cognitive Status: Difficult to assess Area of Impairment: Problem solving;Safety/judgement;Attention   Current Attention Level: Selective   Following Commands: Follows one step commands with increased time;Follows multi-step commands inconsistently Safety/Judgement: Decreased awareness of safety   Problem Solving: Requires verbal cues      Exercises      General Comments General comments (skin integrity, edema, etc.): Daughter present during eval       Pertinent Vitals/Pain Pain Assessment: No/denies pain Faces Pain Scale: No hurt    Home Living Family/patient expects to be discharged to:: Inpatient rehab Living Arrangements: Children (daughter) Available Help at Discharge: Family;Available 24 hours/day (initially) Type of Home: House Home Access: Level entry   Home Layout: One level Home Equipment: Cane - single point;Shower seat      Prior Function Level of Independence: Independent      Comments: daughter reports pt was independent with yard work, cooking, Education officer, environmental, etc    PT Goals (current goals can now be found  in the care plan section) Acute Rehab PT Goals Patient Stated Goal: to get better   Progress towards PT goals: Progressing toward goals    Frequency  Min 4X/week    PT Plan Discharge plan needs to be updated    Co-evaluation             End of Session Equipment Utilized During Treatment: Gait belt Activity Tolerance: Patient tolerated treatment well Patient left: in chair;with call bell/phone within reach;with family/visitor present     Time: 1610-9604 PT Time Calculation (min) (ACUTE ONLY): 19 min  Charges:  $Gait Training: 8-22 mins                    G Codes:      Marcene Brawn 05/26/2015, 4:46 PM  Lewis Shock, PT, DPT Pager #: [phone] Office #: [phone]

## 2015-05-26 NOTE — H&P (View-Only) (Signed)
ELECTROPHYSIOLOGY CONSULT NOTE  Patient ID: MELBURN TREIBER MRN: [id number], DOB/AGE: 73-Jul-1942   Admit date: 05/24/2015 Date of Consult: 05/26/2015  Primary Physician: Phyllis Ginger Primary Cardiologist: Dr Wyline Mood Reason for Consultation: Cryptogenic stroke - TEE pending; recommendations regarding Implantable Loop Recorder  Patient Profile: 74 y.o. male has a past medical history of Diabetes mellitus; Dyslipidemia; MI (myocardial infarction) (Feb 2010); AAA (abdominal aortic aneurysm); Coronary artery disease; Arthritis; Renal insufficiency; and recent Stroke.   History of Present Illness  Lucas Morgan was admitted on 05/24/2015 with CVA.  He first developed symptoms while in [place] on 05/20/2015. His symptoms resolved and he was discharged on 09/09. On 09/10, his symptoms worsened after he woke from a nap.  Imaging demonstrated left superior frontal cortex and subcortical white matter consistent with late acute CVA.  He has undergone workup for stroke including MRI. It showed non-hemorrhagic CVA, both watershed and bilateral distributions, raising concerns for carotid disease and central embolic disease.  The patient has been monitored on telemetry which has demonstrated sinus rhythm with no arrhythmias except occasional PVCs.  Inpatient stroke work-up is to be completed with a TEE.   His EF has been normal with an echo at [facility] 05/2014 showing an EF of 50-55%. Lab work is reviewed.  Prior to admission, the patient denies chest pain, shortness of breath, dizziness, palpitations, or syncope.  They are recovering from their stroke with plans to go to inpatient rehab at discharge.  EP has been asked to evaluate for placement of an implantable loop recorder to monitor for atrial fibrillation.  Cath results 2010: LAD 80%, D1 80%, EF 50% CFX 100%>>0% with 2.5 x 23 Xience V drug-eluting stent.  Past Medical History  Diagnosis Date  . Diabetes mellitus   . Dyslipidemia   . MI  (myocardial infarction) 11/09/2008    2.5 x 23 Xience V DES to the CFX  . AAA (abdominal aortic aneurysm)     a. Abd U/S 7/14: mild aneurysmal dilatation 3x3 cm; cholelithiasis without evid of cholecystitis => repeat 1 year  . Coronary artery disease   . Arthritis     stenosis, lumbar region  . Renal insufficiency   . Stroke      Surgical History:  Past Surgical History  Procedure Laterality Date  . Knee surgery    . Coronary stent placement    . Back surgery  2015    lumbar fusion  . Joint replacement Left   . 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 Lumbar laminectomy/decompression microdiscectomy N/A 10/18/2014    Procedure: LUMBAR LAMINECTOMY/DECOMPRESSION MICRODISCECTOMYLUMBAR THREE-FOUR ;  Surgeon: Temple Pacini, MD;  Location: [facility];  Service: Neurosurgery;  Laterality: N/A;  . Coronary angioplasty       Prescriptions prior to admission  Medication Sig Dispense Refill Last Dose  . ALPRAZolam (XANAX) 0.5 MG tablet Take 0.5 mg by mouth 2 (two) times daily.   0 05/23/2015 at Unknown time  . aspirin 81 MG tablet Take 81 mg by mouth daily.     05/24/2015 at Unknown time  . glipiZIDE (GLUCOTROL) 10 MG tablet Take 10 mg by mouth daily before breakfast.    05/23/2015 at Unknown time  . HYDROcodone-acetaminophen (NORCO) 10-325 MG per tablet Take 1 tablet by mouth every 6 (six) hours as needed (pain). 60 tablet 0 05/23/2015 at Unknown time  . LANTUS SOLOSTAR 100 UNIT/ML Solostar Pen Inject 75 Units  into the skin at bedtime.   0 05/23/2015 at Unknown time  . lisinopril (PRINIVIL,ZESTRIL) 2.5 MG tablet Take 2.5 mg by mouth daily.   05/23/2015 at Unknown time  . metroNIDAZOLE (FLAGYL) 500 MG tablet Take 500 mg by mouth 3 (three) times daily.    05/24/2015 at Unknown time  . simvastatin (ZOCOR) 40 MG tablet Take 40 mg by mouth at bedtime.  6 05/23/2015 at Unknown time  . ondansetron (ZOFRAN) 4 MG tablet Take 1 tablet (4 mg total) by mouth every 6 (six) hours. (Patient  not taking: Reported on 04/08/2015) 12 tablet 0 Not Taking at Unknown time    Inpatient Medications:  . ALPRAZolam  0.5 mg Oral BID  . aspirin  300 mg Rectal Daily   Or  . aspirin  325 mg Oral Daily  . enoxaparin (LOVENOX) injection  40 mg Subcutaneous Q24H  . insulin aspart  0-9 Units Subcutaneous TID WC  . insulin glargine  30 Units Subcutaneous QHS  . simvastatin  40 mg Oral QHS    Allergies:  Allergies  Allergen Reactions  . Trazodone And Nefazodone Other (See Comments)    High blood sugar    Social History   Social History  . Marital Status: Widowed    Spouse Name: N/A  . Number of Children: N/A  . Years of Education: N/A   Occupational History  . Retired    Social History Main Topics  . Smoking status: Former Smoker    Quit date: 09/13/1993  . Smokeless tobacco: Never Used  . Alcohol Use: No  . Drug Use: No  . Sexual Activity: Not on file   Other Topics Concern  . Not on file   Social History Narrative   The patient lives in [place] with the family. He denies any tobacco use.    Family Status  Relation Status Death Age  . Brother Deceased   . Brother Deceased     2 brothers died of acute MI's  . Mother Deceased   . Father Deceased    Family History  Problem Relation Age of Onset  . Heart attack Brother     x2 brothers  . CAD Brother       Review of Systems: Unable to obtain, pt communicates poorly. He denies any symptoms at all.  Physical Exam: Filed Vitals:   05/25/15 2122 05/26/15 0119 05/26/15 0500 05/26/15 0915  BP: 122/66 133/70 128/68 137/83  Pulse: 78 88 83 78  Temp: 98 F (36.7 C) 99.1 F (37.3 C) 98.9 F (37.2 C) 97.9 F (36.6 C)  TempSrc: Oral Oral Oral Oral  Resp: Weight:      SpO2: 98% 98% 98% 98%    GEN- The patient is well appearing, alert and oriented x 2 today.   Head- normocephalic, atraumatic; R facial droop Eyes-  Sclera clear, conjunctiva pink Ears- hearing intact Oropharynx-  clear Neck- supple Lungs- rales R base, normal work of breathing Heart- Regular rate and rhythm, no murmurs, rubs or gallops  GI- soft, NT, ND, + BS Extremities- no clubbing, cyanosis, or edema MS- no significant deformity or atrophy Skin- no rash or lesion Psych- cries when asked about his daughter   Labs:   Lab Results  Component Value Date   WBC 10.5 05/25/2015   HGB 16.0 05/25/2015   HCT 43.6 05/25/2015   MCV 87.7 05/25/2015   PLT 208 05/25/2015    Recent Labs Lab 05/24/15 1740  05/25/15 0705  NA 135  < > 136  K 4.5  < > 4.2  CL 103  < > 105  CO2 23  --  24  BUN 37*  < > 26*  CREATININE 1.87*  < > 1.31*  CALCIUM 9.0  --  8.9  PROT 6.6  --   --   BILITOT 0.7  --   --   ALKPHOS 52  --   --   ALT 14*  --   --   AST 17  --   --   GLUCOSE 182*  < > 119*  < > = values in this interval not displayed.  Lab Results  Component Value Date   CHOL 119 05/25/2015   HDL 33* 05/25/2015   LDLCALC 60 05/25/2015   LDLDIRECT 67.7 12/08/2009   TRIG 129 05/25/2015   CHOLHDL 3.6 05/25/2015    Radiology/Studies: Dg Chest 2 View 05/25/2015   CLINICAL DATA:  Cough.  Recent stroke.  EXAM: CHEST  2 VIEW  COMPARISON:  10/18/2014  FINDINGS: The patient is mildly rotated to the right which partially limits evaluation of the mediastinum. Cardiomediastinal silhouette is grossly unchanged, with cardiac silhouette upper limits of normal in size. The patient has taken a slightly shallower inspiration than on the prior study. Chronic interstitial coarsening is again seen and may reflect underlying COPD/chronic bronchitis. There is slightly increased heterogeneous opacity in the right lung base which projects posteriorly on the lateral image. No pleural effusion or pneumothorax is identified. Degenerative changes are noted at the [facility] joints.  IMPRESSION: COPD with mild right basilar infiltrate versus atelectasis.   Electronically Signed   By: Sebastian Ache M.D.   On: 05/25/2015 12:17   Ct Head Wo  Contrast 05/24/2015   CLINICAL DATA:  Code stroke. RIGHT-sided weakness. Symptoms began 4 days ago.  EXAM: CT HEAD WITHOUT CONTRAST  TECHNIQUE: Contiguous axial images were obtained from the base of the skull through the vertex without intravenous contrast.  COMPARISON:  CT head 06/10/2014.  MR brain 06/10/2014.  FINDINGS: Well-defined area of cytotoxic edema affects the LEFT superior frontal cortex and subcortical white matter consistent with an ischemic event onset 4 days ago. No hemorrhagic transformation. No other areas of acute infarction are suspected.  No CT findings to suggest proximal vascular thrombosis.  Mild atrophy. Chronic LEFT frontal and parietal subcortical infarction related to previous LEFT MCA territory ischemic insult documented on September 2015 MR. An additional LEFT parietal insult predated that stroke.  Hypoattenuation of the periventricular and subcortical regions consistent with small vessel disease.  No mass lesion, hydrocephalus, or extra-axial fluid.  Calvarium intact.  No sinus or mastoid air fluid level.  IMPRESSION: Well-defined area of cytotoxic edema affecting the LEFT superior frontal cortex and subcortical white matter consistent with a late acute cerebral infarction. No features to suggest proximal vascular occlusion or hemorrhagic transformation.  Chronic changes as described.  Critical Value/emergent results were called by telephone at the time of interpretation on 05/24/2015 at 6:00 pm to Dr. Nelva Nay , who verbally acknowledged these results.   Electronically Signed   By: Elsie Stain M.D.   On: 05/24/2015 18:01   Mr Brain Wo Contrast 05/25/2015   CLINICAL DATA:  Recent left-sided infarct with right-sided weakness and aphasia. The weakness has improved but the aphasia continues. The patient was discharged from an outside hospital 2 days ago.  EXAM: MRI HEAD WITHOUT CONTRAST  MRA HEAD WITHOUT CONTRAST  TECHNIQUE: Multiplanar, multiecho pulse sequences of the brain and  surrounding structures were obtained without intravenous contrast. Angiographic images of the head were obtained using MRA technique without contrast.  COMPARISON:  None.  CT head without contrast 05/24/2015.  FINDINGS: MRI HEAD FINDINGS  The acute/subacute cortical and subcortical infarct in the high left frontal lobe is confirmed. This involves the [employer]. Additional scattered foci are present in the frontal lobe and parietal lobe on the left and what appears to be watershed distribution. There is additional involvement of the more superior and medial primary motor cortex. Portions of the inferior parietal and left occipital lobe are noted as well.  Additionally, at least 4 punctate areas of restricted diffusion are present in the right parietal and medial occipital lobe. There is no hemorrhage associated with these lesions. T2 changes are evident within the areas of acute/subacute infarct.  Moderate periventricular and subcortical T2 changes are additionally present bilaterally. Brainstem is unremarkable. Flow is present in the major intracranial arteries.  The study is moderately degraded by patient motion and could not be completed.  The left globe is collapsed with increased signal. The right globe is intact. The orbits are otherwise unremarkable. The paranasal sinuses and mastoid air cells are clear. Midline structures are unremarkable.  MRA HEAD FINDINGS  The internal carotid arteries are within normal limits from the high cervical segments through the ICA termini bilaterally. Moderate narrowing is present in the mid right A1 segment. More mild narrowing is present in the mid left A1 segment. The M1 segments are intact. The left ACA is duplicated. The anterior communicating artery appears to be patent. The MCA bifurcations are intact bilaterally. There is moderate attenuation of MCA branch vessels distally, right greater than left.  The right vertebral artery terminates at the PICA. The left  vertebral artery is within normal limits. The PICA origin is visualized and normal. The basilar artery is within normal limits. Both posterior cerebral arteries originate from the basilar tip. There is some attenuation of distal PCA branch vessels.  IMPRESSION: 1. Acute/subacute nonhemorrhagic infarct involving the left frontal lobe to level of the precentral gyrus. 2. Additional smaller non confluent punctate areas of infarction within the more anterior left frontal lobe, the left parietal lobe, and left occipital lobe seen to follow a watershed distribution. This suggests more proximal disease, potentially in the neck, or and episode of hypotension. 3. At least 4 punctate foci of acute nonhemorrhagic infarct are noted within the right parietal and occipital lobe. Given the bilateral distribution, a central embolic source also needs to be considered. 4. Age advanced atrophy and diffuse white matter disease. 5. Moderate right and mild left mid A1 segment stenoses. 6. Moderate distal small vessel disease, most evident within the right MCA branch vessels.   Electronically Signed   By: Marin Roberts M.D.   On: 05/25/2015 10:41   Mr Maxine Glenn Head/brain Wo Cm 05/25/2015   CLINICAL DATA:  Recent left-sided infarct with right-sided weakness and aphasia. The weakness has improved but the aphasia continues. The patient was discharged from an outside hospital 2 days ago.  EXAM: MRI HEAD WITHOUT CONTRAST  MRA HEAD WITHOUT CONTRAST  TECHNIQUE: Multiplanar, multiecho pulse sequences of the brain and surrounding structures were obtained without intravenous contrast. Angiographic images of the head were obtained using MRA technique without contrast.  COMPARISON:  None.  CT head without contrast 05/24/2015.  FINDINGS: MRI HEAD FINDINGS  The acute/subacute cortical and subcortical infarct in the high left frontal lobe is confirmed. This involves the [employer]. Additional scattered  foci are present in the frontal  lobe and parietal lobe on the left and what appears to be watershed distribution. There is additional involvement of the more superior and medial primary motor cortex. Portions of the inferior parietal and left occipital lobe are noted as well.  Additionally, at least 4 punctate areas of restricted diffusion are present in the right parietal and medial occipital lobe. There is no hemorrhage associated with these lesions. T2 changes are evident within the areas of acute/subacute infarct.  Moderate periventricular and subcortical T2 changes are additionally present bilaterally. Brainstem is unremarkable. Flow is present in the major intracranial arteries.  The study is moderately degraded by patient motion and could not be completed.  The left globe is collapsed with increased signal. The right globe is intact. The orbits are otherwise unremarkable. The paranasal sinuses and mastoid air cells are clear. Midline structures are unremarkable.  MRA HEAD FINDINGS  The internal carotid arteries are within normal limits from the high cervical segments through the ICA termini bilaterally. Moderate narrowing is present in the mid right A1 segment. More mild narrowing is present in the mid left A1 segment. The M1 segments are intact. The left ACA is duplicated. The anterior communicating artery appears to be patent. The MCA bifurcations are intact bilaterally. There is moderate attenuation of MCA branch vessels distally, right greater than left.  The right vertebral artery terminates at the PICA. The left vertebral artery is within normal limits. The PICA origin is visualized and normal. The basilar artery is within normal limits. Both posterior cerebral arteries originate from the basilar tip. There is some attenuation of distal PCA branch vessels.  IMPRESSION: 1. Acute/subacute nonhemorrhagic infarct involving the left frontal lobe to level of the precentral gyrus. 2. Additional smaller non confluent punctate areas of  infarction within the more anterior left frontal lobe, the left parietal lobe, and left occipital lobe seen to follow a watershed distribution. This suggests more proximal disease, potentially in the neck, or and episode of hypotension. 3. At least 4 punctate foci of acute nonhemorrhagic infarct are noted within the right parietal and occipital lobe. Given the bilateral distribution, a central embolic source also needs to be considered. 4. Age advanced atrophy and diffuse white matter disease. 5. Moderate right and mild left mid A1 segment stenoses. 6. Moderate distal small vessel disease, most evident within the right MCA branch vessels.   Electronically Signed   By: Marin Roberts M.D.   On: 05/25/2015 10:41    12-lead ECG SR, occ PVCs, RBBB and LAFB are same as 02/2015 Prior EKG's in [facility] reviewed with no documented atrial fibrillation  Telemetry SR, occ PVCs, No afib noted on telemetry  Assessment and Plan:  1. Cryptogenic stroke The patient presents with cryptogenic stroke.  The patient has a TEE planned for this AM. Risks, benefits, and alteratives to implantable loop recorder were discussed with the patient's daughter Liliane Channel) today.   At this time, the family is very clear in their decision to proceed with implantable loop recorder if the TEE is negative.   Wound care was reviewed with the patient (keep incision clean and dry for 3 days).  Wound check scheduled for 09/22 and is on AVS.  Please call with questions.   Leanna Battles 05/26/2015  EP Attending  Patient seen and examined. Agree with the findings as documented by Theodore Demark, PA-C. The patient has had a cryptogenic stroke. He is pending TEE. If no obvious cause of the stroke  is identified, will plan to place ILR.  Leonia Reeves.D.

## 2015-05-26 NOTE — Progress Notes (Signed)
STROKE TEAM PROGRESS NOTE   SUBJECTIVE (INTERVAL HISTORY) Daughter at bedside. Patient seen in [place] for stroke last week. She shared history with Dr. Roda Shutters. Patient tearful, upset with stroke dx, but agrees he is better than last week. Admitted with Cre 1.8, which is up from baseline 1.2. Currently Cre 1.3 and his symptoms much improved.  TEE done showed EF 30-35%, loop placed.   OBJECTIVE Temp:  [97.7 F (36.5 C)-99.1 F (37.3 C)] 97.7 F (36.5 C) (09/12 1216) Pulse Rate:  [66-98] 76 (09/12 1216) Cardiac Rhythm:  [-] Normal sinus rhythm (09/12 0753) Resp:  [16-24] 18 (09/12 1216) BP: (91-142)/(39-100) 138/75 mmHg (09/12 1216) SpO2:  [96 %-99 %] 99 % (09/12 1216)  CBC:   Recent Labs Lab 05/24/15 1740 05/24/15 1747 05/25/15 0705  WBC 14.1*  --  10.5  NEUTROABS 10.1*  --   --   HGB 15.4 15.6 16.0  HCT 43.2 46.0 43.6  MCV 89.3  --  87.7  PLT 214  --  208    Basic Metabolic Panel:   Recent Labs Lab 05/24/15 1740 05/24/15 1747 05/25/15 0705  NA 135 136 136  K 4.5 4.5 4.2  CL 103 100* 105  CO2 23  --  24  GLUCOSE 182* 181* 119*  BUN 37* 43* 26*  CREATININE 1.87* 1.80* 1.31*  CALCIUM 9.0  --  8.9    Lipid Panel:     Component Value Date/Time   CHOL 119 05/25/2015 0705   TRIG 129 05/25/2015 0705   HDL 33* 05/25/2015 0705   CHOLHDL 3.6 05/25/2015 0705   VLDL 26 05/25/2015 0705   LDLCALC 60 05/25/2015 0705   HgbA1c:  Lab Results  Component Value Date   HGBA1C 7.5* 05/25/2015   Urine Drug Screen: No results found for: LABOPIA, COCAINSCRNUR, LABBENZ, AMPHETMU, THCU, LABBARB    IMAGING Ct Head Wo Contrast 05/24/2015    Well-defined area of cytotoxic edema affecting the LEFT superior frontal cortex and subcortical white matter consistent with a late acute cerebral infarction. No features to suggest proximal vascular occlusion or hemorrhagic transformation.  Chronic changes as described.    MR MRA head/brain without contrast  05/25/2015 1. Acute/subacute  nonhemorrhagic infarct involving the left frontal lobe to level of the precentral gyrus. 2. Additional smaller non confluent punctate areas of infarction within the more anterior left frontal lobe, the left parietal lobe, and left occipital lobe seen to follow a watershed distribution.This suggests more proximal disease, potentially in the neck, or and episode of hypotension. 3. At least 4 punctate foci of acute nonhemorrhagic infarct are noted within the right parietal and occipital lobe. Given the bilateral distribution, a central embolic source also needs to be considered. 4. Age advanced atrophy and diffuse white matter disease. 5. Moderate right and mild left mid A1 segment stenoses. 6. Moderate distal small vessel disease, most evident within the right MCA branch vessels.  TEE  Left Ventrical: Moderate LV dysfunction. EF 30-35% Mitral Valve: MILD MR Aortic Valve: normal AV  Tricuspid Valve: trace TR  Pulmonic Valve: normal PV Left Atrium/ Left atrial appendage: no thrombi  Atrial septum: no evidence of PFO or ASD by color Doppler flow or bubble study  Aorta: mild - moderate aortic calcifications  CUS - pending  Neurologic Exam  Mental Status: Alert, awake, intermittent crying.  Expressive aphasia.  Able to follow simple commands without difficulty. Cranial Nerves: II: Discs not visualized; Visual fields grossly normal, pupils equal, round, reactive to light and accommodation III,IV, VI: ptosis  not present, extra-ocular motions intact bilaterally V,VII: Slight right facial droop, facial light touch sensation normal bilaterally VIII: hearing normal bilaterally IX,X: gag reflex present XI: bilateral shoulder shrug XII: midline tongue extension  Motor: Right : Upper extremity   4/5    Left:     Upper extremity   5/5  Lower extremity   4+/5     Lower extremity   5/5 Tone and bulk:normal tone throughout; no atrophy noted Sensory: Pinprick and light touch intact  throughout, bilaterally Cerebellar: Unable to test Gait: Not tested    ASSESSMENT/PLAN [name] is a 74 y.o. male with history of diabetes mellitus, hyperlipidemia, coronary artery disease with previous MI, abdominal aortic aneurysm, renal insufficiency, and recent stroke presenting with worsening speech and right hemiparesis. He did not receive IV t-PA due to late presentation.  Strokes: suspect multiple bilateral anterior and posterior circulation infarcts possibly embolic of unknown etiology from last week, with recrudesce of stroke symptoms since Friday due to dehydration and elevated Cre  Resultant  speech difficulties was mild right hemiparesis (hand> Leg), both improving  MRI  multiple infarcts as above  MRA  moderate areas of stenosis  Carotid Doppler pending    TEE no source of embolus, no PFO, but EF 30-35% down from 05/2014 50-55% EF.  Loop recorder placed 05/26/15 by Dr. Ladona Ridgel  LDL 60  HgbA1c 7.5  VTE prophylaxis - SCDs Diet Heart Room service appropriate?: Yes; Fluid consistency:: Thin  aspirin 81 mg orally every day prior to admission, now on aspirin 325 mg orally every day. Continue ASA on discharge.  Patient counseled to be compliant with his antithrombotic medications  Ongoing aggressive stroke risk factor management  Therapy recommendations: CIR  Disposition:  pending (daughter lives with pt)  Essential Hypertension  Occasionally low blood pressures otherwise stable. Not currently on antihypertensives medications.  BP goal normotensive  Hyperlipidemia  Home meds:  Zocor 40 mg daily resumed in hospital  LDL 60, goal < 70  Continue statin at discharge  Diabetes type II  HgbA1c 7.5, goal < 7.0  Uncontrolled  Other Stroke Risk Factors  Advanced age  Cigarette smoker, quit smoking 21 years ago.  Hx stroke/TIA  Coronary artery disease - prior inferior MI 10/2008 with DES to LCX  Other Active Problems  Acute on chronic kidney  disease stage III  Leukocytosis  AAA  Chronic back pain  Hospital day # 2  Marvel Plan, MD PhD Stroke Neurology 05/26/2015 9:56 PM    To contact Stroke Continuity provider, please refer to [email or web address]. After hours, contact General Neurology

## 2015-05-26 NOTE — Progress Notes (Signed)
Inpatient Rehabilitation   We have received request for prescreen for possible IP Rehab.  I note pt. Has IP Rehab consult orders.  Will await consult completion then admissions coordinator will follow up.    Madelynn Malson [facility] Admissions Coordinator Cell [phone] Office [phone]

## 2015-05-26 NOTE — Interval H&P Note (Signed)
History and Physical Interval Note:  05/26/2015 10:03 AM  Lucas Morgan  has presented today for surgery, with the diagnosis of stroke  The various methods of treatment have been discussed with the patient and family. After consideration of risks, benefits and other options for treatment, the patient has consented to  Procedure(s): TRANSESOPHAGEAL ECHOCARDIOGRAM (TEE) (N/A) as a surgical intervention .  The patient's history has been reviewed, patient examined, no change in status, stable for surgery.  I have reviewed the patient's chart and labs.  Questions were answered to the patient's satisfaction.     [place], Deloris Ping

## 2015-05-26 NOTE — Progress Notes (Signed)
Nutrition Brief Note  Patient identified on the Malnutrition Screening Tool (MST) Report  Wt Readings from Last 15 Encounters:  05/24/15 168 lb 11.2 oz (76.522 kg)  05/02/15 185 lb (83.915 kg)  04/08/15 177 lb 12.8 oz (80.65 kg)  03/07/15 173 lb 6.4 oz (78.654 kg)  03/03/15 180 lb (81.647 kg)  09/25/14 178 lb (80.74 kg)  09/24/13 174 lb (78.926 kg)  03/12/13 185 lb (83.915 kg)  01/11/13 190 lb (86.183 kg)  04/05/12 196 lb 12.8 oz (89.268 kg)  12/16/10 188 lb (85.276 kg)  08/14/10 190 lb 8 oz (86.41 kg)  06/30/10 188 lb 8 oz (85.503 kg)  05/04/10 192 lb 4 oz (87.204 kg)  01/19/10 196 lb (88.905 kg)    Body mass index is 24.9 kg/(m^2). Patient meets criteria for Normal Weight based on current BMI. Pt unable to speak but, able to answer yes or no questions; family at bedside assisted in answering questions. Pt states that his appetite is good and he is eating well. He denies any unintentional weight loss and reports eating well PTA which family confirms. Per pt's daughter, pt tries to follow a low carb heart healthy diet at home. Pt denies any nutrition education needs at this time.   Current diet order is Heart Healthy, patient is consuming approximately 100% of meals at this time. Labs and medications reviewed.   No nutrition interventions warranted at this time. If nutrition issues arise, please consult RD.   Dorothea Ogle RD, LDN Inpatient Clinical Dietitian Pager: [phone] After Hours Pager: [phone]

## 2015-05-26 NOTE — Progress Notes (Addendum)
PATIENT DETAILS Name: Lucas Morgan Age: 74 y.o. Sex: male Date of Birth: 1941-05-31 Admit Date: 05/24/2015 Admitting Physician Therisa Doyne, MD NWG:[id number], JENNY B, PA-C  Subjective: Still very dysarthric-able to speak a few words, some movement in right upper extremity.  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 Improved RUQ strength. Continue aspirin, TEE negative for embolic source but EF around 08%.LDL 60 (goal<70)-continue statin. A1c 7.5-see below.Plans are for Loop recorder implantation, and await further recommendations from neurology.  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 Creatinine improved following hydration-and back to usual baseline.   Chronic systolic heart failure: EF on TEE around 30-35%, clinically compensated. Further workup deferred to the outpatient setting  Type 2 diabetes: CBGs moderately well controlled-continue Lantus 30 units, add SSI  Dyslipidemia: Continue statin  Essential hypertension: Allow permissive hypertension-especially with extension of acute CVA. Hold all anti-hypertensives  Leukocytosis: Resolved, afebrile. Chest x-ray suggestive of right basilar infiltrate vs atelectasis-monitor off Abx.   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  MEDICATIONS: Scheduled Meds: . ALPRAZolam  0.5 mg Oral BID  . aspirin  300 mg Rectal Daily   Or  . aspirin  325 mg Oral Daily  . heparin subcutaneous  5,000 Units Subcutaneous 3 times per day  . insulin aspart  0-9 Units Subcutaneous TID WC  . insulin glargine  30 Units Subcutaneous QHS  . simvastatin  40 mg Oral QHS   Continuous Infusions:   PRN Meds:.acetaminophen **OR** acetaminophen, HYDROcodone-acetaminophen, ondansetron (ZOFRAN) IV, senna-docusate    PHYSICAL EXAM: Vital signs in last 24 hours: Filed Vitals:   05/26/15 1150 05/26/15 1200 05/26/15 1216 05/26/15 1356  BP: 127/39 111/65 138/75 111/60  Pulse: 66 81 76 87  Temp:   97.7 F (36.5 C) 97.8 F (36.6 C)  TempSrc:   Oral Oral  Resp:  Weight:      SpO2: 99% 98% 99% 98%    Weight change:  Filed Weights   05/24/15 2138  Weight: 76.522 kg (168 lb 11.2 oz)   Body mass index is 24.9 kg/(m^2).   Gen Exam: Awake,but dysarthric Neck: Supple, No JVD. Chest: B/L Clear.   CVS: S1 S2 Regular, no murmurs.  Abdomen: soft, BS +, non tender, non distended.  Extremities: no edema, lower extremities warm to touch. Neurologic: RUE 4/5,RLE 4/5, LUE/LLE-5/5 Skin: No Rash.   Wounds: N/A.   Intake/Output from previous day:  Intake/Output Summary (Last 24 hours) at 05/26/15 1447 Last data filed at 05/26/15 1300  Gross per 24 hour  Intake    240 ml  Output      0 ml  Net    240 ml     LAB RESULTS: CBC  Recent Labs Lab 05/24/15 1740 05/24/15 1747 05/25/15  1610 05/26/15 1347  WBC 14.1*  --  10.5 11.6*  HGB 15.4 15.6 16.0 16.3  HCT 43.2 46.0 43.6 45.7  PLT 214  --  208 201  MCV 89.3  --  87.7 89.8  MCH 31.8  --  32.2 32.0  MCHC 35.6  --  36.7* 35.7  RDW 12.5  --  12.5 12.3  LYMPHSABS 2.4  --   --   --   MONOABS 1.4*  --   --   --   EOSABS 0.2  --   --   --   BASOSABS 0.0  --   --   --     Chemistries   Recent Labs Lab 05/24/15 1740  05/24/15 1747 05/25/15 0705 05/26/15 1347  NA 135 136 136  --   K 4.5 4.5 4.2  --   CL 103 100* 105  --   CO2 23  --  24  --   GLUCOSE 182* 181* 119*  --   BUN 37* 43* 26*  --   CREATININE 1.87* 1.80* 1.31* 1.39*  CALCIUM 9.0  --  8.9  --     CBG:  Recent Labs Lab 05/24/15 2241 05/25/15 1633 05/25/15 2152 05/26/15 0630 05/26/15 1228  GLUCAP 203* 212* 261* 123* 107*    GFR Estimated Creatinine Clearance: 47.3 mL/min (by C-G formula based on Cr of 1.39).  Coagulation profile  Recent Labs Lab 05/24/15 1740  INR 1.09    Cardiac Enzymes No results for input(s): CKMB, TROPONINI, MYOGLOBIN in the last 168 hours.  Invalid input(s): CK  Invalid input(s): POCBNP No results for input(s): DDIMER in the last 72 hours.  Recent Labs  05/25/15 0705  HGBA1C 7.5*    Recent Labs  05/25/15 0705  CHOL 119  HDL 33*  LDLCALC 60  TRIG 960  CHOLHDL 3.6   No results for input(s): TSH, T4TOTAL, T3FREE, THYROIDAB in the last 72 hours.  Invalid input(s): FREET3 No results for input(s): VITAMINB12, FOLATE, FERRITIN, TIBC, IRON, RETICCTPCT in the last 72 hours. No results for input(s): LIPASE, AMYLASE in the last 72 hours.  Urine Studies No results for input(s): UHGB, CRYS in the last 72 hours.  Invalid input(s): UACOL, UAPR, USPG, UPH, UTP, UGL, UKET, UBIL, UNIT, UROB, ULEU, UEPI, UWBC, URBC, UBAC, CAST, UCOM, BILUA  MICROBIOLOGY: No results found for this or any previous visit (from the past 240 hour(s)).  RADIOLOGY STUDIES/RESULTS: Dg Chest 2 View  05/25/2015   CLINICAL DATA:  Cough.  Recent stroke.  EXAM: CHEST  2 VIEW  COMPARISON:  10/18/2014  FINDINGS: The patient is mildly rotated to the right which partially limits evaluation of the mediastinum. Cardiomediastinal silhouette is grossly unchanged, with cardiac silhouette upper limits of normal in size. The patient has taken a slightly shallower inspiration than on the prior study. Chronic interstitial coarsening is  again seen and may reflect underlying COPD/chronic bronchitis. There is slightly increased heterogeneous opacity in the right lung base which projects posteriorly on the lateral image. No pleural effusion or pneumothorax is identified. Degenerative changes are noted at the [facility] joints.  IMPRESSION: COPD with mild right basilar infiltrate versus atelectasis.   Electronically Signed   By: Sebastian Ache M.D.   On: 05/25/2015 12:17   Ct Head Wo Contrast  05/24/2015   CLINICAL DATA:  Code stroke. RIGHT-sided weakness. Symptoms began 4 days ago.  EXAM: CT HEAD WITHOUT CONTRAST  TECHNIQUE: Contiguous axial images were obtained from the base of the skull through the vertex  without intravenous contrast.  COMPARISON:  CT head 06/10/2014.  MR brain 06/10/2014.  FINDINGS: Well-defined area of cytotoxic edema affects the LEFT superior frontal cortex and subcortical white matter consistent with an ischemic event onset 4 days ago. No hemorrhagic transformation. No other areas of acute infarction are suspected.  No CT findings to suggest proximal vascular thrombosis.  Mild atrophy. Chronic LEFT frontal and parietal subcortical infarction related to previous LEFT MCA territory ischemic insult documented on September 2015 MR. An additional LEFT parietal insult predated that stroke.  Hypoattenuation of the periventricular and subcortical regions consistent with small vessel disease.  No mass lesion, hydrocephalus, or extra-axial fluid.  Calvarium intact.  No sinus or mastoid air fluid level.  IMPRESSION: Well-defined area of cytotoxic edema affecting the LEFT superior frontal cortex and subcortical white matter consistent with a late acute cerebral infarction. No features to suggest proximal vascular occlusion or hemorrhagic transformation.  Chronic changes as described.  Critical Value/emergent results were called by telephone at the time of interpretation on 05/24/2015 at 6:00 pm to Dr. Nelva Nay , who verbally acknowledged these  results.   Electronically Signed   By: Elsie Stain M.D.   On: 05/24/2015 18:01   Mr Brain Wo Contrast  05/25/2015   CLINICAL DATA:  Recent left-sided infarct with right-sided weakness and aphasia. The weakness has improved but the aphasia continues. The patient was discharged from an outside hospital 2 days ago.  EXAM: MRI HEAD WITHOUT CONTRAST  MRA HEAD WITHOUT CONTRAST  TECHNIQUE: Multiplanar, multiecho pulse sequences of the brain and surrounding structures were obtained without intravenous contrast. Angiographic images of the head were obtained using MRA technique without contrast.  COMPARISON:  None.  CT head without contrast 05/24/2015.  FINDINGS: MRI HEAD FINDINGS  The acute/subacute cortical and subcortical infarct in the high left frontal lobe is confirmed. This involves the [employer]. Additional scattered foci are present in the frontal lobe and parietal lobe on the left and what appears to be watershed distribution. There is additional involvement of the more superior and medial primary motor cortex. Portions of the inferior parietal and left occipital lobe are noted as well.  Additionally, at least 4 punctate areas of restricted diffusion are present in the right parietal and medial occipital lobe. There is no hemorrhage associated with these lesions. T2 changes are evident within the areas of acute/subacute infarct.  Moderate periventricular and subcortical T2 changes are additionally present bilaterally. Brainstem is unremarkable. Flow is present in the major intracranial arteries.  The study is moderately degraded by patient motion and could not be completed.  The left globe is collapsed with increased signal. The right globe is intact. The orbits are otherwise unremarkable. The paranasal sinuses and mastoid air cells are clear. Midline structures are unremarkable.  MRA HEAD FINDINGS  The internal carotid arteries are within normal limits from the high cervical segments through the  ICA termini bilaterally. Moderate narrowing is present in the mid right A1 segment. More mild narrowing is present in the mid left A1 segment. The M1 segments are intact. The left ACA is duplicated. The anterior communicating artery appears to be patent. The MCA bifurcations are intact bilaterally. There is moderate attenuation of MCA branch vessels distally, right greater than left.  The right vertebral artery terminates at the PICA. The left vertebral artery is within normal limits. The PICA origin is visualized and normal. The basilar artery is within normal limits. Both posterior cerebral arteries originate from the basilar tip. There is  some attenuation of distal PCA branch vessels.  IMPRESSION: 1. Acute/subacute nonhemorrhagic infarct involving the left frontal lobe to level of the precentral gyrus. 2. Additional smaller non confluent punctate areas of infarction within the more anterior left frontal lobe, the left parietal lobe, and left occipital lobe seen to follow a watershed distribution. This suggests more proximal disease, potentially in the neck, or and episode of hypotension. 3. At least 4 punctate foci of acute nonhemorrhagic infarct are noted within the right parietal and occipital lobe. Given the bilateral distribution, a central embolic source also needs to be considered. 4. Age advanced atrophy and diffuse white matter disease. 5. Moderate right and mild left mid A1 segment stenoses. 6. Moderate distal small vessel disease, most evident within the right MCA branch vessels.   Electronically Signed   By: Marin Roberts M.D.   On: 05/25/2015 10:41   Mr Lumbar Spine Wo Contrast  05/02/2015   CLINICAL DATA:  Chronic worsening low back pain with bilateral leg weakness and numbness.  EXAM: MRI LUMBAR SPINE WITHOUT CONTRAST  TECHNIQUE: Multiplanar, multisequence MR imaging of the lumbar spine was performed. No intravenous contrast was administered.  COMPARISON:  02/12/2013  FINDINGS: There are  chronic L1 and L4 vertebral body compression fractures with remote vertebral body augmentation with methylmethacrylate within the vertebral bodies. There is 9 mm of retropulsion of the superior posterior margin of the L1 vertebral body. There is approximately 70% height loss of the L1 vertebral body. There is approximately 50% height loss of the L4 vertebral body.  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  The vertebral bodies of the lumbar spine are normal in alignment. There is normal bone marrow signal demonstrated throughout the vertebra. The intervertebral disc spaces are well-maintained.  The spinal cord is normal in signal and contour. The cord terminates normally at L1 . The nerve roots of the cauda equina and the filum terminale are normal.  The visualized portions of the SI joints are unremarkable.  There is a mild infrarenal abdominal aortic aneurysm measuring 3 cm in AP diameter.  T12-L1: Mild eccentric left broad-based disc bulge. Retropulsion of the superior posterior margin of the L1 vertebral body flattens the ventral thecal sac and results in mild spinal stenosis. No evidence of neural foraminal stenosis.  L1-L2: Mild broad-based disc bulge. No evidence of neural foraminal stenosis. No central canal stenosis.  L2-L3: Mild broad-based disc bulge. No evidence of neural foraminal stenosis. No central canal stenosis.  L3-L4: Prior laminectomy. Mild broad-based disc bulge. Moderate right and mild left facet arthropathy. Moderate right foraminal stenosis. No left foraminal stenosis. No central canal stenosis.  L4-L5: Mild broad-based disc bulge. Moderate bilateral facet arthropathy with ligamentum flavum infolding resulting in moderate spinal stenosis and left lateral recess stenosis. Severe left foraminal stenosis. Mild right foraminal stenosis.  L5-S1: No significant disc bulge. No evidence of neural foraminal stenosis. No  central canal stenosis. Moderate bilateral facet arthropathy.  IMPRESSION: 1. At T12-L1 there is a mild eccentric left broad-based disc bulge. Retropulsion of the superior posterior margin of the L1 vertebral body flattens the ventral thecal sac and results in mild spinal stenosis. 2. At L3-4 there is a mild broad-based disc bulge. Prior laminectomy. Moderate right and mild left facet arthropathy. Moderate right foraminal stenosis. 3. At L4-5 there is a mild broad-based disc bulge. Moderate bilateral facet arthropathy with ligamentum flavum infolding resulting in moderate spinal stenosis and left  lateral recess stenosis. Severe left foraminal stenosis. Mild right foraminal stenosis. 4. 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 5. 3 cm infrarenal abdominal aortic aneurysm.   Electronically Signed   By: Elige Ko   On: 05/02/2015 12:07   Mr Lucas Morgan Head/brain Wo Cm  05/25/2015   CLINICAL DATA:  Recent left-sided infarct with right-sided weakness and aphasia. The weakness has improved but the aphasia continues. The patient was discharged from an outside hospital 2 days ago.  EXAM: MRI HEAD WITHOUT CONTRAST  MRA HEAD WITHOUT CONTRAST  TECHNIQUE: Multiplanar, multiecho pulse sequences of the brain and surrounding structures were obtained without intravenous contrast. Angiographic images of the head were obtained using MRA technique without contrast.  COMPARISON:  None.  CT head without contrast 05/24/2015.  FINDINGS: MRI HEAD FINDINGS  The acute/subacute cortical and subcortical infarct in the high left frontal lobe is confirmed. This involves the [employer]. Additional scattered foci are present in the frontal lobe and parietal lobe on the left and what appears to be watershed distribution. There is additional involvement of the more superior and medial primary motor cortex. Portions of the inferior parietal and left occipital lobe are noted as well.  Additionally, at least 4 punctate areas  of restricted diffusion are present in the right parietal and medial occipital lobe. There is no hemorrhage associated with these lesions. T2 changes are evident within the areas of acute/subacute infarct.  Moderate periventricular and subcortical T2 changes are additionally present bilaterally. Brainstem is unremarkable. Flow is present in the major intracranial arteries.  The study is moderately degraded by patient motion and could not be completed.  The left globe is collapsed with increased signal. The right globe is intact. The orbits are otherwise unremarkable. The paranasal sinuses and mastoid air cells are clear. Midline structures are unremarkable.  MRA HEAD FINDINGS  The internal carotid arteries are within normal limits from the high cervical segments through the ICA termini bilaterally. Moderate narrowing is present in the mid right A1 segment. More mild narrowing is present in the mid left A1 segment. The M1 segments are intact. The left ACA is duplicated. The anterior communicating artery appears to be patent. The MCA bifurcations are intact bilaterally. There is moderate attenuation of MCA branch vessels distally, right greater than left.  The right vertebral artery terminates at the PICA. The left vertebral artery is within normal limits. The PICA origin is visualized and normal. The basilar artery is within normal limits. Both posterior cerebral arteries originate from the basilar tip. There is some attenuation of distal PCA branch vessels.  IMPRESSION: 1. Acute/subacute nonhemorrhagic infarct involving the left frontal lobe to level of the precentral gyrus. 2. Additional smaller non confluent punctate areas of infarction within the more anterior left frontal lobe, the left parietal lobe, and left occipital lobe seen to follow a watershed distribution. This suggests more proximal disease, potentially in the neck, or and episode of hypotension. 3. At least 4 punctate foci of acute nonhemorrhagic  infarct are noted within the right parietal and occipital lobe. Given the bilateral distribution, a central embolic source also needs to be considered. 4. Age advanced atrophy and diffuse white matter disease. 5. Moderate right and mild left mid A1 segment stenoses. 6. Moderate distal small vessel disease, most evident within the right MCA branch vessels.   Electronically Signed   By: Marin Roberts M.D.   On: 05/25/2015 10:41    Jeoffrey Massed, MD  [facility] Pager:[phone]  If 7PM-7AM,  please contact night-coverage [email or web address] Password [email or web address] 05/26/2015, 2:47 PM   LOS: 2 days

## 2015-05-26 NOTE — Interval H&P Note (Signed)
History and Physical Interval Note:  05/26/2015 10:16 AM  Lucas Morgan  has presented today for surgery, with the diagnosis of snycope  The various methods of treatment have been discussed with the patient and family. After consideration of risks, benefits and other options for treatment, the patient has consented to  Procedure(s): Loop Recorder Insertion (N/A) as a surgical intervention .  The patient's history has been reviewed, patient examined, no change in status, stable for surgery.  I have reviewed the patient's chart and labs.  Questions were answered to the patient's satisfaction.     Lewayne Bunting

## 2015-05-26 NOTE — Consult Note (Signed)
ELECTROPHYSIOLOGY CONSULT NOTE  Patient ID: Lucas Morgan MRN: [id number], DOB/AGE: 73-Jul-1942   Admit date: 05/24/2015 Date of Consult: 05/26/2015  Primary Physician: Phyllis Ginger Primary Cardiologist: Dr Wyline Mood Reason for Consultation: Cryptogenic stroke - TEE pending; recommendations regarding Implantable Loop Recorder  Patient Profile: 74 y.o. male has a past medical history of Diabetes mellitus; Dyslipidemia; MI (myocardial infarction) (Feb 2010); AAA (abdominal aortic aneurysm); Coronary artery disease; Arthritis; Renal insufficiency; and recent Stroke.   History of Present Illness  Lucas Morgan was admitted on 05/24/2015 with CVA.  He first developed symptoms while in [place] on 05/20/2015. His symptoms resolved and he was discharged on 09/09. On 09/10, his symptoms worsened after he woke from a nap.  Imaging demonstrated left superior frontal cortex and subcortical white matter consistent with late acute CVA.  He has undergone workup for stroke including MRI. It showed non-hemorrhagic CVA, both watershed and bilateral distributions, raising concerns for carotid disease and central embolic disease.  The patient has been monitored on telemetry which has demonstrated sinus rhythm with no arrhythmias except occasional PVCs.  Inpatient stroke work-up is to be completed with a TEE.   His EF has been normal with an echo at [facility] 05/2014 showing an EF of 50-55%. Lab work is reviewed.  Prior to admission, the patient denies chest pain, shortness of breath, dizziness, palpitations, or syncope.  They are recovering from their stroke with plans to go to inpatient rehab at discharge.  EP has been asked to evaluate for placement of an implantable loop recorder to monitor for atrial fibrillation.  Cath results 2010: LAD 80%, D1 80%, EF 50% CFX 100%>>0% with 2.5 x 23 Xience V drug-eluting stent.  Past Medical History  Diagnosis Date  . Diabetes mellitus   . Dyslipidemia   . MI  (myocardial infarction) 11/09/2008    2.5 x 23 Xience V DES to the CFX  . AAA (abdominal aortic aneurysm)     a. Abd U/S 7/14: mild aneurysmal dilatation 3x3 cm; cholelithiasis without evid of cholecystitis => repeat 1 year  . Coronary artery disease   . Arthritis     stenosis, lumbar region  . Renal insufficiency   . Stroke      Surgical History:  Past Surgical History  Procedure Laterality Date  . Knee surgery    . Coronary stent placement    . Back surgery  2015    lumbar fusion  . Joint replacement Left   . 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 Lumbar laminectomy/decompression microdiscectomy N/A 10/18/2014    Procedure: LUMBAR LAMINECTOMY/DECOMPRESSION MICRODISCECTOMYLUMBAR THREE-FOUR ;  Surgeon: Temple Pacini, MD;  Location: [facility];  Service: Neurosurgery;  Laterality: N/A;  . Coronary angioplasty       Prescriptions prior to admission  Medication Sig Dispense Refill Last Dose  . ALPRAZolam (XANAX) 0.5 MG tablet Take 0.5 mg by mouth 2 (two) times daily.   0 05/23/2015 at Unknown time  . aspirin 81 MG tablet Take 81 mg by mouth daily.     05/24/2015 at Unknown time  . glipiZIDE (GLUCOTROL) 10 MG tablet Take 10 mg by mouth daily before breakfast.    05/23/2015 at Unknown time  . HYDROcodone-acetaminophen (NORCO) 10-325 MG per tablet Take 1 tablet by mouth every 6 (six) hours as needed (pain). 60 tablet 0 05/23/2015 at Unknown time  . LANTUS SOLOSTAR 100 UNIT/ML Solostar Pen Inject 75 Units  into the skin at bedtime.   0 05/23/2015 at Unknown time  . lisinopril (PRINIVIL,ZESTRIL) 2.5 MG tablet Take 2.5 mg by mouth daily.   05/23/2015 at Unknown time  . metroNIDAZOLE (FLAGYL) 500 MG tablet Take 500 mg by mouth 3 (three) times daily.    05/24/2015 at Unknown time  . simvastatin (ZOCOR) 40 MG tablet Take 40 mg by mouth at bedtime.  6 05/23/2015 at Unknown time  . ondansetron (ZOFRAN) 4 MG tablet Take 1 tablet (4 mg total) by mouth every 6 (six) hours. (Patient  not taking: Reported on 04/08/2015) 12 tablet 0 Not Taking at Unknown time    Inpatient Medications:  . ALPRAZolam  0.5 mg Oral BID  . aspirin  300 mg Rectal Daily   Or  . aspirin  325 mg Oral Daily  . enoxaparin (LOVENOX) injection  40 mg Subcutaneous Q24H  . insulin aspart  0-9 Units Subcutaneous TID WC  . insulin glargine  30 Units Subcutaneous QHS  . simvastatin  40 mg Oral QHS    Allergies:  Allergies  Allergen Reactions  . Trazodone And Nefazodone Other (See Comments)    High blood sugar    Social History   Social History  . Marital Status: Widowed    Spouse Name: N/A  . Number of Children: N/A  . Years of Education: N/A   Occupational History  . Retired    Social History Main Topics  . Smoking status: Former Smoker    Quit date: 09/13/1993  . Smokeless tobacco: Never Used  . Alcohol Use: No  . Drug Use: No  . Sexual Activity: Not on file   Other Topics Concern  . Not on file   Social History Narrative   The patient lives in [place] with the family. He denies any tobacco use.    Family Status  Relation Status Death Age  . Brother Deceased   . Brother Deceased     2 brothers died of acute MI's  . Mother Deceased   . Father Deceased    Family History  Problem Relation Age of Onset  . Heart attack Brother     x2 brothers  . CAD Brother       Review of Systems: Unable to obtain, pt communicates poorly. He denies any symptoms at all.  Physical Exam: Filed Vitals:   05/25/15 2122 05/26/15 0119 05/26/15 0500 05/26/15 0915  BP: 122/66 133/70 128/68 137/83  Pulse: 78 88 83 78  Temp: 98 F (36.7 C) 99.1 F (37.3 C) 98.9 F (37.2 C) 97.9 F (36.6 C)  TempSrc: Oral Oral Oral Oral  Resp: Weight:      SpO2: 98% 98% 98% 98%    GEN- The patient is well appearing, alert and oriented x 2 today.   Head- normocephalic, atraumatic; R facial droop Eyes-  Sclera clear, conjunctiva pink Ears- hearing intact Oropharynx-  clear Neck- supple Lungs- rales R base, normal work of breathing Heart- Regular rate and rhythm, no murmurs, rubs or gallops  GI- soft, NT, ND, + BS Extremities- no clubbing, cyanosis, or edema MS- no significant deformity or atrophy Skin- no rash or lesion Psych- cries when asked about his daughter   Labs:   Lab Results  Component Value Date   WBC 10.5 05/25/2015   HGB 16.0 05/25/2015   HCT 43.6 05/25/2015   MCV 87.7 05/25/2015   PLT 208 05/25/2015    Recent Labs Lab 05/24/15 1740  05/25/15 0705  NA 135  < > 136  K 4.5  < > 4.2  CL 103  < > 105  CO2 23  --  24  BUN 37*  < > 26*  CREATININE 1.87*  < > 1.31*  CALCIUM 9.0  --  8.9  PROT 6.6  --   --   BILITOT 0.7  --   --   ALKPHOS 52  --   --   ALT 14*  --   --   AST 17  --   --   GLUCOSE 182*  < > 119*  < > = values in this interval not displayed.  Lab Results  Component Value Date   CHOL 119 05/25/2015   HDL 33* 05/25/2015   LDLCALC 60 05/25/2015   LDLDIRECT 67.7 12/08/2009   TRIG 129 05/25/2015   CHOLHDL 3.6 05/25/2015    Radiology/Studies: Dg Chest 2 View 05/25/2015   CLINICAL DATA:  Cough.  Recent stroke.  EXAM: CHEST  2 VIEW  COMPARISON:  10/18/2014  FINDINGS: The patient is mildly rotated to the right which partially limits evaluation of the mediastinum. Cardiomediastinal silhouette is grossly unchanged, with cardiac silhouette upper limits of normal in size. The patient has taken a slightly shallower inspiration than on the prior study. Chronic interstitial coarsening is again seen and may reflect underlying COPD/chronic bronchitis. There is slightly increased heterogeneous opacity in the right lung base which projects posteriorly on the lateral image. No pleural effusion or pneumothorax is identified. Degenerative changes are noted at the [facility] joints.  IMPRESSION: COPD with mild right basilar infiltrate versus atelectasis.   Electronically Signed   By: Sebastian Ache M.D.   On: 05/25/2015 12:17   Ct Head Wo  Contrast 05/24/2015   CLINICAL DATA:  Code stroke. RIGHT-sided weakness. Symptoms began 4 days ago.  EXAM: CT HEAD WITHOUT CONTRAST  TECHNIQUE: Contiguous axial images were obtained from the base of the skull through the vertex without intravenous contrast.  COMPARISON:  CT head 06/10/2014.  MR brain 06/10/2014.  FINDINGS: Well-defined area of cytotoxic edema affects the LEFT superior frontal cortex and subcortical white matter consistent with an ischemic event onset 4 days ago. No hemorrhagic transformation. No other areas of acute infarction are suspected.  No CT findings to suggest proximal vascular thrombosis.  Mild atrophy. Chronic LEFT frontal and parietal subcortical infarction related to previous LEFT MCA territory ischemic insult documented on September 2015 MR. An additional LEFT parietal insult predated that stroke.  Hypoattenuation of the periventricular and subcortical regions consistent with small vessel disease.  No mass lesion, hydrocephalus, or extra-axial fluid.  Calvarium intact.  No sinus or mastoid air fluid level.  IMPRESSION: Well-defined area of cytotoxic edema affecting the LEFT superior frontal cortex and subcortical white matter consistent with a late acute cerebral infarction. No features to suggest proximal vascular occlusion or hemorrhagic transformation.  Chronic changes as described.  Critical Value/emergent results were called by telephone at the time of interpretation on 05/24/2015 at 6:00 pm to Dr. Nelva Nay , who verbally acknowledged these results.   Electronically Signed   By: Elsie Stain M.D.   On: 05/24/2015 18:01   Mr Brain Wo Contrast 05/25/2015   CLINICAL DATA:  Recent left-sided infarct with right-sided weakness and aphasia. The weakness has improved but the aphasia continues. The patient was discharged from an outside hospital 2 days ago.  EXAM: MRI HEAD WITHOUT CONTRAST  MRA HEAD WITHOUT CONTRAST  TECHNIQUE: Multiplanar, multiecho pulse sequences of the brain and  surrounding structures were obtained without intravenous contrast. Angiographic images of the head were obtained using MRA technique without contrast.  COMPARISON:  None.  CT head without contrast 05/24/2015.  FINDINGS: MRI HEAD FINDINGS  The acute/subacute cortical and subcortical infarct in the high left frontal lobe is confirmed. This involves the [employer]. Additional scattered foci are present in the frontal lobe and parietal lobe on the left and what appears to be watershed distribution. There is additional involvement of the more superior and medial primary motor cortex. Portions of the inferior parietal and left occipital lobe are noted as well.  Additionally, at least 4 punctate areas of restricted diffusion are present in the right parietal and medial occipital lobe. There is no hemorrhage associated with these lesions. T2 changes are evident within the areas of acute/subacute infarct.  Moderate periventricular and subcortical T2 changes are additionally present bilaterally. Brainstem is unremarkable. Flow is present in the major intracranial arteries.  The study is moderately degraded by patient motion and could not be completed.  The left globe is collapsed with increased signal. The right globe is intact. The orbits are otherwise unremarkable. The paranasal sinuses and mastoid air cells are clear. Midline structures are unremarkable.  MRA HEAD FINDINGS  The internal carotid arteries are within normal limits from the high cervical segments through the ICA termini bilaterally. Moderate narrowing is present in the mid right A1 segment. More mild narrowing is present in the mid left A1 segment. The M1 segments are intact. The left ACA is duplicated. The anterior communicating artery appears to be patent. The MCA bifurcations are intact bilaterally. There is moderate attenuation of MCA branch vessels distally, right greater than left.  The right vertebral artery terminates at the PICA. The left  vertebral artery is within normal limits. The PICA origin is visualized and normal. The basilar artery is within normal limits. Both posterior cerebral arteries originate from the basilar tip. There is some attenuation of distal PCA branch vessels.  IMPRESSION: 1. Acute/subacute nonhemorrhagic infarct involving the left frontal lobe to level of the precentral gyrus. 2. Additional smaller non confluent punctate areas of infarction within the more anterior left frontal lobe, the left parietal lobe, and left occipital lobe seen to follow a watershed distribution. This suggests more proximal disease, potentially in the neck, or and episode of hypotension. 3. At least 4 punctate foci of acute nonhemorrhagic infarct are noted within the right parietal and occipital lobe. Given the bilateral distribution, a central embolic source also needs to be considered. 4. Age advanced atrophy and diffuse white matter disease. 5. Moderate right and mild left mid A1 segment stenoses. 6. Moderate distal small vessel disease, most evident within the right MCA branch vessels.   Electronically Signed   By: Marin Roberts M.D.   On: 05/25/2015 10:41   Mr Maxine Glenn Head/brain Wo Cm 05/25/2015   CLINICAL DATA:  Recent left-sided infarct with right-sided weakness and aphasia. The weakness has improved but the aphasia continues. The patient was discharged from an outside hospital 2 days ago.  EXAM: MRI HEAD WITHOUT CONTRAST  MRA HEAD WITHOUT CONTRAST  TECHNIQUE: Multiplanar, multiecho pulse sequences of the brain and surrounding structures were obtained without intravenous contrast. Angiographic images of the head were obtained using MRA technique without contrast.  COMPARISON:  None.  CT head without contrast 05/24/2015.  FINDINGS: MRI HEAD FINDINGS  The acute/subacute cortical and subcortical infarct in the high left frontal lobe is confirmed. This involves the [employer]. Additional scattered  foci are present in the frontal  lobe and parietal lobe on the left and what appears to be watershed distribution. There is additional involvement of the more superior and medial primary motor cortex. Portions of the inferior parietal and left occipital lobe are noted as well.  Additionally, at least 4 punctate areas of restricted diffusion are present in the right parietal and medial occipital lobe. There is no hemorrhage associated with these lesions. T2 changes are evident within the areas of acute/subacute infarct.  Moderate periventricular and subcortical T2 changes are additionally present bilaterally. Brainstem is unremarkable. Flow is present in the major intracranial arteries.  The study is moderately degraded by patient motion and could not be completed.  The left globe is collapsed with increased signal. The right globe is intact. The orbits are otherwise unremarkable. The paranasal sinuses and mastoid air cells are clear. Midline structures are unremarkable.  MRA HEAD FINDINGS  The internal carotid arteries are within normal limits from the high cervical segments through the ICA termini bilaterally. Moderate narrowing is present in the mid right A1 segment. More mild narrowing is present in the mid left A1 segment. The M1 segments are intact. The left ACA is duplicated. The anterior communicating artery appears to be patent. The MCA bifurcations are intact bilaterally. There is moderate attenuation of MCA branch vessels distally, right greater than left.  The right vertebral artery terminates at the PICA. The left vertebral artery is within normal limits. The PICA origin is visualized and normal. The basilar artery is within normal limits. Both posterior cerebral arteries originate from the basilar tip. There is some attenuation of distal PCA branch vessels.  IMPRESSION: 1. Acute/subacute nonhemorrhagic infarct involving the left frontal lobe to level of the precentral gyrus. 2. Additional smaller non confluent punctate areas of  infarction within the more anterior left frontal lobe, the left parietal lobe, and left occipital lobe seen to follow a watershed distribution. This suggests more proximal disease, potentially in the neck, or and episode of hypotension. 3. At least 4 punctate foci of acute nonhemorrhagic infarct are noted within the right parietal and occipital lobe. Given the bilateral distribution, a central embolic source also needs to be considered. 4. Age advanced atrophy and diffuse white matter disease. 5. Moderate right and mild left mid A1 segment stenoses. 6. Moderate distal small vessel disease, most evident within the right MCA branch vessels.   Electronically Signed   By: Marin Roberts M.D.   On: 05/25/2015 10:41    12-lead ECG SR, occ PVCs, RBBB and LAFB are same as 02/2015 Prior EKG's in [facility] reviewed with no documented atrial fibrillation  Telemetry SR, occ PVCs, No afib noted on telemetry  Assessment and Plan:  1. Cryptogenic stroke The patient presents with cryptogenic stroke.  The patient has a TEE planned for this AM. Risks, benefits, and alteratives to implantable loop recorder were discussed with the patient's daughter Liliane Channel) today.   At this time, the family is very clear in their decision to proceed with implantable loop recorder if the TEE is negative.   Wound care was reviewed with the patient (keep incision clean and dry for 3 days).  Wound check scheduled for 09/22 and is on AVS.  Please call with questions.   Leanna Battles 05/26/2015  EP Attending  Patient seen and examined. Agree with the findings as documented by Theodore Demark, PA-C. The patient has had a cryptogenic stroke. He is pending TEE. If no obvious cause of the stroke  is identified, will plan to place ILR.  Leonia Reeves.D.

## 2015-05-26 NOTE — Evaluation (Signed)
Occupational Therapy Evaluation Patient Details Name: Lucas Morgan MRN: [id number] DOB: Jun 29, 1941 Today's Date: 05/26/2015    History of Present Illness 74 y.o. 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 But has bilateral acute infarcts.   Clinical Impression   Pt admitted with above. He demonstrates the below listed deficits and will benefit from continued OT to maximize safety and independence with BADLs.  Pt presents to OT with Rt hemiparesis, Rt inattention, impaired cognition and communication deficits. He requires mod A, overall for ADLs.  Recommend CIR.       Follow Up Recommendations  CIR;Supervision/Assistance - 24 hour    Equipment Recommendations  3 in 1 bedside comode;Tub/shower bench    Recommendations for Other Services Rehab consult     Precautions / Restrictions Precautions Precautions: Fall      Mobility Bed Mobility Overal bed mobility: Needs Assistance Bed Mobility: Sit to Supine       Sit to supine: Min guard   General bed mobility comments: min guard for safety  Transfers Overall transfer level: Needs assistance Equipment used: 1 person hand held assist Transfers: Sit to/from [employer] Sit to Stand: Min guard Stand pivot transfers: Mod assist;Min assist       General transfer comment: Min A most of time for balance, but when fatigued and turning to Rt, lost balance x 2 requiring mod A to recover     Balance Overall balance assessment: Needs assistance Sitting-balance support: Feet supported Sitting balance-Leahy Scale: Fair Sitting balance - Comments: Pt occasionally loses balance to Rt when attempting to don/doff socks    Standing balance support: During functional activity Standing balance-Leahy Scale: Poor Standing balance comment: Pt requires min guard to min A for standing balance.  Intermittently loses balance during grooming and dressing tasks                              ADL Overall ADL's : Needs assistance/impaired Eating/Feeding: Moderate assistance;Bed level;Sitting   Grooming: Wash/dry hands;Wash/dry face;Brushing hair;Oral care;Moderate assistance;Standing Grooming Details (indicate cue type and reason): Pt requires assist for thoroughness.  Assist to locate items and min A for standing balance  Upper Body Bathing: Moderate assistance;Sitting   Lower Body Bathing: Moderate assistance;Sit to/from stand   Upper Body Dressing : Moderate assistance;Sitting   Lower Body Dressing: Moderate assistance;Sit to/from stand Lower Body Dressing Details (indicate cue type and reason): Pt able to doff socks with supervision while seated.   Requires mod A to don socks.   Toilet Transfer: Minimal assistance;Moderate assistance;Ambulation;Comfort height toilet Toilet Transfer Details (indicate cue type and reason): Pt ambulated into BR with min A, but lost balance as the turned toward rt requiring mod A to recover  Toileting- Clothing Manipulation and Hygiene: Moderate assistance;Sit to/from stand       Functional mobility during ADLs: Minimal assistance;Moderate assistance General ADL Comments: Pt demonstrates Rt inattention requiring mod cues to attend fully to Rt UE (intermittent), and cues to locate items on Rt.      Vision Vision Assessment?: Yes Additional Comments: Pt demonstrated difficulty following commands for full visual assessment.   He was noted to "loose" item on Rt during pursuits, and was inconsistent with locating items on Rt during confrontation testing.  While performing grooming, pt primarly searched for items on his Lt.  Required min - mod cues to scan to Rt.   Once cued to scan to Rt, he was able to  locate needed items    Perception Perception Perception Tested?: Yes Perception Deficits: Inattention/neglect Inattention/Neglect: Does not attend to right visual field;Does not attend to right side of body Spatial deficits: will attend to  Rt with cues    Praxis Praxis Praxis tested?: Within functional limits    Pertinent Vitals/Pain Pain Assessment: Faces Faces Pain Scale: No hurt     Hand Dominance Right   Extremity/Trunk Assessment Upper Extremity Assessment Upper Extremity Assessment: RUE deficits/detail RUE Deficits / Details: Pt able to reach ~70* forward flexion.  elbow flexion ~3/5, pt demonstrates minimal thumb movement and minimal finger flexion during functional tasks.  Pt does not flex/extend digits on command  RUE Coordination: decreased fine motor;decreased gross motor   Lower Extremity Assessment Lower Extremity Assessment: Defer to PT evaluation   Cervical / Trunk Assessment Cervical / Trunk Assessment: Other exceptions Cervical / Trunk Exceptions: Rt trunk weakness    Communication Communication Communication: Expressive difficulties   Cognition Arousal/Alertness: Awake/alert Behavior During Therapy: Impulsive;Flat affect Overall Cognitive Status: Difficult to assess Area of Impairment: Problem solving;Safety/judgement;Attention   Current Attention Level: Selective   Following Commands: Follows one step commands with increased time Safety/Judgement: Decreased awareness of safety   Problem Solving: Requires verbal cues     General Comments       Exercises       Shoulder Instructions      Home Living Family/patient expects to be discharged to:: Inpatient rehab Living Arrangements: Children (daughter) Available Help at Discharge: Family;Available 24 hours/day (initially) Type of Home: House Home Access: Level entry     Home Layout: One level               Home Equipment: Cane - single point;Shower seat          Prior Functioning/Environment Level of Independence: Independent        Comments: daughter reports pt was independent with yard work, cooking, Education officer, environmental, etc     OT Diagnosis: Generalized weakness;Cognitive deficits;Disturbance of vision;Hemiplegia  dominant side   OT Problem List: Decreased strength;Decreased range of motion;Decreased activity tolerance;Impaired balance (sitting and/or standing);Impaired vision/perception;Decreased coordination;Decreased cognition;Decreased safety awareness;Decreased knowledge of use of DME or AE;Decreased knowledge of precautions;Impaired tone;Impaired UE functional use   OT Treatment/Interventions: Self-care/ADL training;DME and/or AE instruction;Therapeutic activities;Cognitive remediation/compensation;Visual/perceptual remediation/compensation;Patient/family education;Balance training    OT Goals(Current goals can be found in the care plan section) Acute Rehab OT Goals Patient Stated Goal: to get better  OT Goal Formulation: With patient/family Time For Goal Achievement: 06/09/15 Potential to Achieve Goals: Good ADL Goals Pt Will Perform Eating: with supervision;with set-up;sitting Pt Will Perform Grooming: with supervision;standing Pt Will Perform Upper Body Bathing: with supervision;sitting Pt Will Perform Lower Body Bathing: with supervision;sit to/from stand Pt Will Perform Upper Body Dressing: with min assist;sitting Pt Will Perform Lower Body Dressing: with min assist;sit to/from stand Pt Will Transfer to Toilet: with supervision;ambulating;regular height toilet;bedside commode;grab bars Pt Will Perform Toileting - Clothing Manipulation and hygiene: with supervision;sit to/from stand Pt/caregiver will Perform Home Exercise Program: Increased ROM;Increased strength;Right Upper extremity;With Supervision;With written HEP provided Additional ADL Goal #1: Pt will locate ADL items on Rt with min verbal cues   OT Frequency: Min 2X/week   Barriers to D/C:            Co-evaluation              End of Session Nurse Communication: Mobility status  Activity Tolerance: Patient tolerated treatment well Patient left: in bed;with call bell/phone within reach;with bed  alarm set;with  family/visitor present   Time: 1315-1341 OT Time Calculation (min): 26 min Charges:  OT General Charges $OT Visit: 1 Procedure OT Evaluation $Initial OT Evaluation Tier I: 1 Procedure OT Treatments $Self Care/Home Management : 8-22 mins G-Codes:    Krystn Dermody M 06-08-15, 2:04 PM

## 2015-05-26 NOTE — CV Procedure (Signed)
    Transesophageal Echocardiogram Note  HRIDHAAN YOHN [id number] 19-Oct-1940  Procedure: Transesophageal Echocardiogram Indications: cva   Procedure Details Consent: Obtained Time Out: Verified patient identification, verified procedure, site/side was marked, verified correct patient position, special equipment/implants available, Radiology Safety Procedures followed,  medications/allergies/relevent history reviewed, required imaging and test results available.  Performed  Medications: Fentanyl: 25 mcg iv  Versed:  2 mg iv   Left Ventrical:  Moderate LV dysfunction.  EF 30-35%  Mitral Valve: MILD MR   Aortic Valve: normal AV   Tricuspid Valve: trace TR   Pulmonic Valve: normal PV  Left Atrium/ Left atrial appendage: no thrombi   Atrial septum: no evidence of PFO or ASD by color Doppler flow or bubble study   Aorta: mild - moderate aortic calcification s   Complications: No apparent complications Patient did tolerate procedure well.  Will proceed with implantable loop recorder   Alvia Grove., MD, [facility] 05/26/2015, 10:17 AM

## 2015-05-26 NOTE — Progress Notes (Signed)
[facility]  Patient Status: Active (receiving services up to time of hospitalization)  [facility] is providing the following services: RN, PT and ST  If patient discharges after hours, please call [phone].   Lucas Morgan 05/26/2015, 10:49 AM

## 2015-05-26 NOTE — Clinical Documentation Improvement (Signed)
Internal Medicine Neurology  Based on the clinical findings below, please document any associated diagnoses/conditions the patient has or may have.   Cerebral edema  Vasogenic edema  Other  Clinically Undetermined  Supporting Information:  Ct Head Wo Contrast 05/24/2015  Well-defined area of cytotoxic edema affecting the LEFT superior frontal cortex and subcortical white matter consistent with a late acute cerebral infarction. No features to suggest proximal vascular occlusion or hemorrhagic transformation. Chronic changes as described.  ASSESSMENT/PLAN Strokes: suspect multiple bilateral anterior and posterior circulation infarcts possibly embolic of unknown etiology from last week with recrudesce of stroke symptoms since Friday due to dehydration  Resultant speech difficulties was mild right hemiparesis (hand> Leg), both improving  MRI multiple infarcts as noted above  MRA moderate areas of stenosis  Treatments CT head MRA/MRI  Please exercise your independent, professional judgment when responding. A specific answer is not anticipated or expected.   Thank You, Harless Litten [facility] [employer] [facility] [place] [phone]

## 2015-05-26 NOTE — H&P (View-Only) (Signed)
PATIENT DETAILS Name: Lucas Morgan Age: 74 y.o. Sex: male Date of Birth: 1940/12/14 Admit Date: 05/24/2015 Admitting Physician Therisa Doyne, MD ZOX:[id number], JENNY B, PA-C  Subjective: Dysarthric, no movement in right upper extremity.  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 But has bilateral acute infarcts. Continue aspirin, will likely need a TEE. LDL 60 (goal<70)-continue statin. A1c pending. Now with significant dysarthria and significant right upper extremity weakness.  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 Creatinine improved following hydration.  Type 2 diabetes: CBGs moderately well controlled-continue Lantus 30 units, add SSI  Dyslipidemia: Continue statin  Essential hypertension: Allow permissive hypertension-especially with extension of acute CVA. Hold all anti-hypertensives  Leukocytosis: Resolved, afebrile. Chest x-ray suggestive of right basilar infiltrate vs atelectasis-monitor off Abx.   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  MEDICATIONS: Scheduled Meds: . ALPRAZolam  0.5 mg Oral BID  . aspirin  300 mg Rectal Daily   Or  . aspirin  325 mg Oral Daily  . insulin glargine  30 Units Subcutaneous QHS  . simvastatin  40 mg Oral QHS   Continuous Infusions: .  sodium chloride 75 mL/hr at 05/24/15 2244   PRN Meds:.acetaminophen **OR** acetaminophen, HYDROcodone-acetaminophen, senna-docusate    PHYSICAL EXAM: Vital signs in last 24 hours: Filed Vitals:   05/25/15 0345 05/25/15 0545 05/25/15 0800 05/25/15 1032  BP: 116/58 123/64 120/72 128/73  Pulse: 82 76 79 90  Temp: 97.7 F (36.5 C) 97.8 F (36.6 C) 97.7 F (36.5 C) 97.8 F (36.6 C)  TempSrc: Oral Oral Oral Oral  Resp: 20 20 16 16   Weight:      SpO2: 97% 96% 99% 98%    Weight change:  Filed Weights   05/24/15 2138  Weight: 76.522 kg (168 lb 11.2 oz)   Body mass index is 24.9 kg/(m^2).   Gen Exam: Awake,but dysarthric Neck: Supple, No JVD. Chest: B/L Clear.   CVS: S1 S2 Regular, no murmurs.  Abdomen: soft, BS +, non tender, non distended.  Extremities: no edema, lower extremities warm to touch. Neurologic: RUE 0/5,RLE 4/5, LUE/LLE-5/5 Skin: No Rash.   Wounds: N/A.   Intake/Output from previous day:  Intake/Output Summary (Last 24 hours) at 05/25/15 1324 Last data filed at 05/25/15 0000  Gross per 24 hour  Intake    240 ml  Output      0 ml  Net    240 ml     LAB RESULTS: CBC  Recent Labs Lab 05/24/15 1740 05/24/15 1747 05/25/15 0705  WBC 14.1*  --  10.5  HGB 15.4 15.6 16.0  HCT 43.2 46.0 43.6  PLT 214  --  208  MCV 89.3  --  87.7  MCH 31.8  --  32.2  MCHC 35.6  --  36.7*  RDW 12.5  --  12.5  LYMPHSABS 2.4  --   --   MONOABS 1.4*  --   --   EOSABS 0.2  --   --   BASOSABS 0.0  --   --     Chemistries   Recent Labs Lab 05/24/15 1740 05/24/15 1747 05/25/15 0705  NA 135 136 136  K 4.5 4.5 4.2  CL 103 100* 105  CO2 23  --  24  GLUCOSE 182* 181* 119*  BUN 37* 43* 26*  CREATININE 1.87* 1.80* 1.31*  CALCIUM 9.0  --  8.9    CBG:  Recent Labs Lab 05/24/15 1741 05/24/15 2241  GLUCAP 187* 203*    GFR Estimated Creatinine Clearance: 50.2 mL/min (by C-G formula based on Cr of 1.31).  Coagulation profile  Recent Labs Lab 05/24/15 1740   INR 1.09    Cardiac Enzymes No results for input(s): CKMB, TROPONINI, MYOGLOBIN in the last 168 hours.  Invalid input(s): CK  Invalid input(s): POCBNP No results for input(s): DDIMER in the last 72 hours. No results for input(s): HGBA1C in the last 72 hours.  Recent Labs  05/25/15 0705  CHOL 119  HDL 33*  LDLCALC 60  TRIG 478  CHOLHDL 3.6   No results for input(s): TSH, T4TOTAL, T3FREE, THYROIDAB in the last 72 hours.  Invalid input(s): FREET3 No results for input(s): VITAMINB12, FOLATE, FERRITIN, TIBC, IRON, RETICCTPCT in the last 72 hours. No results for input(s): LIPASE, AMYLASE in the last 72 hours.  Urine Studies No results for input(s): UHGB, CRYS in the last 72 hours.  Invalid input(s): UACOL, UAPR, USPG, UPH, UTP, UGL, UKET, UBIL, UNIT, UROB, ULEU, UEPI, UWBC, URBC, UBAC, CAST, UCOM, BILUA  MICROBIOLOGY: No results found for this or any previous visit (from the past 240 hour(s)).  RADIOLOGY STUDIES/RESULTS: Dg Chest 2 View  05/25/2015   CLINICAL DATA:  Cough.  Recent stroke.  EXAM: CHEST  2 VIEW  COMPARISON:  10/18/2014  FINDINGS: The patient is mildly rotated to the right which partially limits evaluation of the mediastinum. Cardiomediastinal silhouette is grossly unchanged, with cardiac silhouette upper limits of normal in size. The patient has taken a slightly shallower inspiration than on the prior study. Chronic interstitial coarsening is again seen and may reflect underlying COPD/chronic bronchitis. There is slightly increased heterogeneous opacity in the right lung base which projects posteriorly on the lateral image. No pleural effusion or pneumothorax is identified. Degenerative changes are noted at the [facility] joints.  IMPRESSION: COPD with mild right basilar infiltrate versus atelectasis.   Electronically Signed   By: Sebastian Ache M.D.   On: 05/25/2015 12:17   Ct Head Wo Contrast  05/24/2015   CLINICAL DATA:  Code stroke. RIGHT-sided weakness. Symptoms began 4  days ago.  EXAM: CT HEAD WITHOUT CONTRAST  TECHNIQUE: Contiguous axial images were obtained from the base of the skull through the vertex without intravenous contrast.  COMPARISON:  CT head 06/10/2014.  MR brain 06/10/2014.  FINDINGS: Well-defined area of cytotoxic edema affects the LEFT superior frontal cortex and subcortical white matter consistent with an ischemic event onset 4 days ago. No hemorrhagic transformation. No other areas of acute infarction are suspected.  No CT findings to suggest proximal vascular thrombosis.  Mild atrophy. Chronic LEFT frontal and parietal subcortical infarction related to previous LEFT MCA territory ischemic insult documented on September 2015 MR. An additional LEFT parietal insult predated that stroke.  Hypoattenuation of the periventricular and subcortical regions consistent with small vessel disease.  No mass lesion, hydrocephalus, or extra-axial  fluid.  Calvarium intact.  No sinus or mastoid air fluid level.  IMPRESSION: Well-defined area of cytotoxic edema affecting the LEFT superior frontal cortex and subcortical white matter consistent with a late acute cerebral infarction. No features to suggest proximal vascular occlusion or hemorrhagic transformation.  Chronic changes as described.  Critical Value/emergent results were called by telephone at the time of interpretation on 05/24/2015 at 6:00 pm to Dr. Nelva Nay , who verbally acknowledged these results.   Electronically Signed   By: Elsie Stain M.D.   On: 05/24/2015 18:01   Mr Brain Wo Contrast  05/25/2015   CLINICAL DATA:  Recent left-sided infarct with right-sided weakness and aphasia. The weakness has improved but the aphasia continues. The patient was discharged from an outside hospital 2 days ago.  EXAM: MRI HEAD WITHOUT CONTRAST  MRA HEAD WITHOUT CONTRAST  TECHNIQUE: Multiplanar, multiecho pulse sequences of the brain and surrounding structures were obtained without intravenous contrast. Angiographic images  of the head were obtained using MRA technique without contrast.  COMPARISON:  None.  CT head without contrast 05/24/2015.  FINDINGS: MRI HEAD FINDINGS  The acute/subacute cortical and subcortical infarct in the high left frontal lobe is confirmed. This involves the [employer]. Additional scattered foci are present in the frontal lobe and parietal lobe on the left and what appears to be watershed distribution. There is additional involvement of the more superior and medial primary motor cortex. Portions of the inferior parietal and left occipital lobe are noted as well.  Additionally, at least 4 punctate areas of restricted diffusion are present in the right parietal and medial occipital lobe. There is no hemorrhage associated with these lesions. T2 changes are evident within the areas of acute/subacute infarct.  Moderate periventricular and subcortical T2 changes are additionally present bilaterally. Brainstem is unremarkable. Flow is present in the major intracranial arteries.  The study is moderately degraded by patient motion and could not be completed.  The left globe is collapsed with increased signal. The right globe is intact. The orbits are otherwise unremarkable. The paranasal sinuses and mastoid air cells are clear. Midline structures are unremarkable.  MRA HEAD FINDINGS  The internal carotid arteries are within normal limits from the high cervical segments through the ICA termini bilaterally. Moderate narrowing is present in the mid right A1 segment. More mild narrowing is present in the mid left A1 segment. The M1 segments are intact. The left ACA is duplicated. The anterior communicating artery appears to be patent. The MCA bifurcations are intact bilaterally. There is moderate attenuation of MCA branch vessels distally, right greater than left.  The right vertebral artery terminates at the PICA. The left vertebral artery is within normal limits. The PICA origin is visualized and normal. The  basilar artery is within normal limits. Both posterior cerebral arteries originate from the basilar tip. There is some attenuation of distal PCA branch vessels.  IMPRESSION: 1. Acute/subacute nonhemorrhagic infarct involving the left frontal lobe to level of the precentral gyrus. 2. Additional smaller non confluent punctate areas of infarction within the more anterior left frontal lobe, the left parietal lobe, and left occipital lobe seen to follow a watershed distribution. This suggests more proximal disease, potentially in the neck, or and episode of hypotension. 3. At least 4 punctate foci of acute nonhemorrhagic infarct are noted within the right parietal and occipital lobe. Given the bilateral distribution, a central embolic source also needs to be considered. 4. Age advanced atrophy and diffuse white matter disease. 5.  Moderate right and mild left mid A1 segment stenoses. 6. Moderate distal small vessel disease, most evident within the right MCA branch vessels.   Electronically Signed   By: Marin Roberts M.D.   On: 05/25/2015 10:41   Mr Lumbar Spine Wo Contrast  05/02/2015   CLINICAL DATA:  Chronic worsening low back pain with bilateral leg weakness and numbness.  EXAM: MRI LUMBAR SPINE WITHOUT CONTRAST  TECHNIQUE: Multiplanar, multisequence MR imaging of the lumbar spine was performed. No intravenous contrast was administered.  COMPARISON:  02/12/2013  FINDINGS: There are chronic L1 and L4 vertebral body compression fractures with remote vertebral body augmentation with methylmethacrylate within the vertebral bodies. There is 9 mm of retropulsion of the superior posterior margin of the L1 vertebral body. There is approximately 70% height loss of the L1 vertebral body. There is approximately 50% height loss of the L4 vertebral body.  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  The vertebral bodies of the lumbar spine are  normal in alignment. There is normal bone marrow signal demonstrated throughout the vertebra. The intervertebral disc spaces are well-maintained.  The spinal cord is normal in signal and contour. The cord terminates normally at L1 . The nerve roots of the cauda equina and the filum terminale are normal.  The visualized portions of the SI joints are unremarkable.  There is a mild infrarenal abdominal aortic aneurysm measuring 3 cm in AP diameter.  T12-L1: Mild eccentric left broad-based disc bulge. Retropulsion of the superior posterior margin of the L1 vertebral body flattens the ventral thecal sac and results in mild spinal stenosis. No evidence of neural foraminal stenosis.  L1-L2: Mild broad-based disc bulge. No evidence of neural foraminal stenosis. No central canal stenosis.  L2-L3: Mild broad-based disc bulge. No evidence of neural foraminal stenosis. No central canal stenosis.  L3-L4: Prior laminectomy. Mild broad-based disc bulge. Moderate right and mild left facet arthropathy. Moderate right foraminal stenosis. No left foraminal stenosis. No central canal stenosis.  L4-L5: Mild broad-based disc bulge. Moderate bilateral facet arthropathy with ligamentum flavum infolding resulting in moderate spinal stenosis and left lateral recess stenosis. Severe left foraminal stenosis. Mild right foraminal stenosis.  L5-S1: No significant disc bulge. No evidence of neural foraminal stenosis. No central canal stenosis. Moderate bilateral facet arthropathy.  IMPRESSION: 1. At T12-L1 there is a mild eccentric left broad-based disc bulge. Retropulsion of the superior posterior margin of the L1 vertebral body flattens the ventral thecal sac and results in mild spinal stenosis. 2. At L3-4 there is a mild broad-based disc bulge. Prior laminectomy. Moderate right and mild left facet arthropathy. Moderate right foraminal stenosis. 3. At L4-5 there is a mild broad-based disc bulge. Moderate bilateral facet arthropathy with  ligamentum flavum infolding resulting in moderate spinal stenosis and left lateral recess stenosis. Severe left foraminal stenosis. Mild right foraminal stenosis. 4. 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 5. 3 cm infrarenal abdominal aortic aneurysm.   Electronically Signed   By: Elige Ko   On: 05/02/2015 12:07   Mr Maxine Glenn Head/brain Wo Cm  05/25/2015   CLINICAL DATA:  Recent left-sided infarct with right-sided weakness and aphasia. The weakness has improved but the aphasia continues. The patient was discharged from an outside hospital 2 days ago.  EXAM: MRI HEAD WITHOUT CONTRAST  MRA HEAD WITHOUT CONTRAST  TECHNIQUE: Multiplanar, multiecho pulse sequences of the brain and surrounding structures were obtained without intravenous  contrast. Angiographic images of the head were obtained using MRA technique without contrast.  COMPARISON:  None.  CT head without contrast 05/24/2015.  FINDINGS: MRI HEAD FINDINGS  The acute/subacute cortical and subcortical infarct in the high left frontal lobe is confirmed. This involves the [employer]. Additional scattered foci are present in the frontal lobe and parietal lobe on the left and what appears to be watershed distribution. There is additional involvement of the more superior and medial primary motor cortex. Portions of the inferior parietal and left occipital lobe are noted as well.  Additionally, at least 4 punctate areas of restricted diffusion are present in the right parietal and medial occipital lobe. There is no hemorrhage associated with these lesions. T2 changes are evident within the areas of acute/subacute infarct.  Moderate periventricular and subcortical T2 changes are additionally present bilaterally. Brainstem is unremarkable. Flow is present in the major intracranial arteries.  The study is moderately degraded by patient motion and could not be completed.  The left globe is collapsed with increased signal. The right globe is  intact. The orbits are otherwise unremarkable. The paranasal sinuses and mastoid air cells are clear. Midline structures are unremarkable.  MRA HEAD FINDINGS  The internal carotid arteries are within normal limits from the high cervical segments through the ICA termini bilaterally. Moderate narrowing is present in the mid right A1 segment. More mild narrowing is present in the mid left A1 segment. The M1 segments are intact. The left ACA is duplicated. The anterior communicating artery appears to be patent. The MCA bifurcations are intact bilaterally. There is moderate attenuation of MCA branch vessels distally, right greater than left.  The right vertebral artery terminates at the PICA. The left vertebral artery is within normal limits. The PICA origin is visualized and normal. The basilar artery is within normal limits. Both posterior cerebral arteries originate from the basilar tip. There is some attenuation of distal PCA branch vessels.  IMPRESSION: 1. Acute/subacute nonhemorrhagic infarct involving the left frontal lobe to level of the precentral gyrus. 2. Additional smaller non confluent punctate areas of infarction within the more anterior left frontal lobe, the left parietal lobe, and left occipital lobe seen to follow a watershed distribution. This suggests more proximal disease, potentially in the neck, or and episode of hypotension. 3. At least 4 punctate foci of acute nonhemorrhagic infarct are noted within the right parietal and occipital lobe. Given the bilateral distribution, a central embolic source also needs to be considered. 4. Age advanced atrophy and diffuse white matter disease. 5. Moderate right and mild left mid A1 segment stenoses. 6. Moderate distal small vessel disease, most evident within the right MCA branch vessels.   Electronically Signed   By: Marin Roberts M.D.   On: 05/25/2015 10:41    Jeoffrey Massed, MD  [facility] Pager:[phone]  If 7PM-7AM, please  contact night-coverage [email or web address] Password [email or web address] 05/25/2015, 1:24 PM   LOS: 1 day

## 2015-05-27 ENCOUNTER — Inpatient Hospital Stay (HOSPITAL_COMMUNITY)
Admission: EM | Admit: 2015-05-27 | Discharge: 2015-06-11 | DRG: 056 | Disposition: A | Discharge status: Home Health | Payer: Medicare Other | Source: Intra-hospital | Attending: Physical Medicine & Rehabilitation | Admitting: Physical Medicine & Rehabilitation

## 2015-05-27 ENCOUNTER — Inpatient Hospital Stay (HOSPITAL_COMMUNITY): Payer: Medicare Other

## 2015-05-27 DIAGNOSIS — E1122 Type 2 diabetes mellitus with diabetic chronic kidney disease: Secondary | ICD-10-CM | POA: Diagnosis present

## 2015-05-27 DIAGNOSIS — R109 Unspecified abdominal pain: Secondary | ICD-10-CM

## 2015-05-27 DIAGNOSIS — I714 Abdominal aortic aneurysm, without rupture: Secondary | ICD-10-CM | POA: Diagnosis present

## 2015-05-27 DIAGNOSIS — G894 Chronic pain syndrome: Secondary | ICD-10-CM | POA: Diagnosis present

## 2015-05-27 DIAGNOSIS — G8191 Hemiplegia, unspecified affecting right dominant side: Secondary | ICD-10-CM

## 2015-05-27 DIAGNOSIS — I6789 Other cerebrovascular disease: Secondary | ICD-10-CM | POA: Diagnosis not present

## 2015-05-27 DIAGNOSIS — F4322 Adjustment disorder with anxiety: Secondary | ICD-10-CM | POA: Diagnosis present

## 2015-05-27 DIAGNOSIS — Z634 Disappearance and death of family member: Secondary | ICD-10-CM

## 2015-05-27 DIAGNOSIS — E119 Type 2 diabetes mellitus without complications: Secondary | ICD-10-CM | POA: Diagnosis present

## 2015-05-27 DIAGNOSIS — E11649 Type 2 diabetes mellitus with hypoglycemia without coma: Secondary | ICD-10-CM | POA: Diagnosis not present

## 2015-05-27 DIAGNOSIS — I63412 Cerebral infarction due to embolism of left middle cerebral artery: Secondary | ICD-10-CM

## 2015-05-27 DIAGNOSIS — E118 Type 2 diabetes mellitus with unspecified complications: Secondary | ICD-10-CM | POA: Diagnosis present

## 2015-05-27 DIAGNOSIS — I129 Hypertensive chronic kidney disease with stage 1 through stage 4 chronic kidney disease, or unspecified chronic kidney disease: Secondary | ICD-10-CM | POA: Diagnosis present

## 2015-05-27 DIAGNOSIS — G936 Cerebral edema: Secondary | ICD-10-CM | POA: Diagnosis present

## 2015-05-27 DIAGNOSIS — I69392 Facial weakness following cerebral infarction: Secondary | ICD-10-CM | POA: Diagnosis not present

## 2015-05-27 DIAGNOSIS — I429 Cardiomyopathy, unspecified: Secondary | ICD-10-CM | POA: Insufficient documentation

## 2015-05-27 DIAGNOSIS — E785 Hyperlipidemia, unspecified: Secondary | ICD-10-CM | POA: Diagnosis present

## 2015-05-27 DIAGNOSIS — I6340 Cerebral infarction due to embolism of unspecified cerebral artery: Secondary | ICD-10-CM

## 2015-05-27 DIAGNOSIS — I69322 Dysarthria following cerebral infarction: Secondary | ICD-10-CM | POA: Diagnosis not present

## 2015-05-27 DIAGNOSIS — H5347 Heteronymous bilateral field defects: Secondary | ICD-10-CM | POA: Diagnosis present

## 2015-05-27 DIAGNOSIS — N182 Chronic kidney disease, stage 2 (mild): Secondary | ICD-10-CM | POA: Diagnosis present

## 2015-05-27 DIAGNOSIS — I639 Cerebral infarction, unspecified: Secondary | ICD-10-CM | POA: Diagnosis present

## 2015-05-27 DIAGNOSIS — I2510 Atherosclerotic heart disease of native coronary artery without angina pectoris: Secondary | ICD-10-CM | POA: Diagnosis present

## 2015-05-27 DIAGNOSIS — I69398 Other sequelae of cerebral infarction: Secondary | ICD-10-CM

## 2015-05-27 DIAGNOSIS — I6529 Occlusion and stenosis of unspecified carotid artery: Secondary | ICD-10-CM | POA: Insufficient documentation

## 2015-05-27 DIAGNOSIS — I10 Essential (primary) hypertension: Secondary | ICD-10-CM | POA: Diagnosis present

## 2015-05-27 DIAGNOSIS — Z87891 Personal history of nicotine dependence: Secondary | ICD-10-CM | POA: Diagnosis not present

## 2015-05-27 DIAGNOSIS — I69351 Hemiplegia and hemiparesis following cerebral infarction affecting right dominant side: Principal | ICD-10-CM

## 2015-05-27 DIAGNOSIS — N289 Disorder of kidney and ureter, unspecified: Secondary | ICD-10-CM | POA: Diagnosis present

## 2015-05-27 DIAGNOSIS — E1143 Type 2 diabetes mellitus with diabetic autonomic (poly)neuropathy: Secondary | ICD-10-CM | POA: Diagnosis present

## 2015-05-27 DIAGNOSIS — Z955 Presence of coronary angioplasty implant and graft: Secondary | ICD-10-CM | POA: Diagnosis not present

## 2015-05-27 DIAGNOSIS — I69920 Aphasia following unspecified cerebrovascular disease: Secondary | ICD-10-CM | POA: Diagnosis not present

## 2015-05-27 DIAGNOSIS — G8190 Hemiplegia, unspecified affecting unspecified side: Secondary | ICD-10-CM | POA: Diagnosis not present

## 2015-05-27 DIAGNOSIS — K5900 Constipation, unspecified: Secondary | ICD-10-CM | POA: Diagnosis present

## 2015-05-27 DIAGNOSIS — I7771 Dissection of carotid artery: Secondary | ICD-10-CM | POA: Diagnosis present

## 2015-05-27 DIAGNOSIS — E860 Dehydration: Secondary | ICD-10-CM | POA: Diagnosis present

## 2015-05-27 DIAGNOSIS — I252 Old myocardial infarction: Secondary | ICD-10-CM

## 2015-05-27 DIAGNOSIS — F5104 Psychophysiologic insomnia: Secondary | ICD-10-CM | POA: Diagnosis present

## 2015-05-27 DIAGNOSIS — I6522 Occlusion and stenosis of left carotid artery: Secondary | ICD-10-CM | POA: Diagnosis not present

## 2015-05-27 DIAGNOSIS — I69320 Aphasia following cerebral infarction: Secondary | ICD-10-CM | POA: Diagnosis not present

## 2015-05-27 LAB — BASIC METABOLIC PANEL
Anion gap: 8 (ref 5–15)
BUN: 19 mg/dL (ref 6–20)
CHLORIDE: 100 mmol/L — AB (ref 101–111)
CO2: 26 mmol/L (ref 22–32)
CREATININE: 1.31 mg/dL — AB (ref 0.61–1.24)
Calcium: 9.1 mg/dL (ref 8.9–10.3)
GFR calc non Af Amer: 52 mL/min — ABNORMAL LOW (ref >60)
Glucose, Bld: 171 mg/dL — ABNORMAL HIGH (ref 65–99)
POTASSIUM: 4.5 mmol/L (ref 3.5–5.1)
SODIUM: 134 mmol/L — AB (ref 135–145)

## 2015-05-27 LAB — CBC
HEMATOCRIT: 44.9 % (ref 39.0–52.0)
Hemoglobin: 15.9 g/dL (ref 13.0–17.0)
MCH: 31.5 pg (ref 26.0–34.0)
MCHC: 35.4 g/dL (ref 30.0–36.0)
MCV: 89.1 fL (ref 78.0–100.0)
PLATELETS: 189 10*3/uL (ref 150–400)
RBC: 5.04 MIL/uL (ref 4.22–5.81)
RDW: 12.4 % (ref 11.5–15.5)
WBC: 8.3 10*3/uL (ref 4.0–10.5)

## 2015-05-27 LAB — GLUCOSE, CAPILLARY
GLUCOSE-CAPILLARY: 232 mg/dL — AB (ref 65–99)
GLUCOSE-CAPILLARY: 184 mg/dL — AB (ref 65–99)
Glucose-Capillary: 172 mg/dL — ABNORMAL HIGH (ref 65–99)

## 2015-05-27 MED ORDER — SENNOSIDES-DOCUSATE SODIUM 8.6-50 MG PO TABS
1.0000 | ORAL_TABLET | Freq: Every evening | ORAL | Status: DC | PRN
  Administered 2015-05-30: 1 via ORAL
  Filled 2015-05-27 (×2): qty 1

## 2015-05-27 MED ORDER — ALUM & MAG HYDROXIDE-SIMETH 200-200-20 MG/5ML PO SUSP
30.0000 mL | ORAL | Status: DC | PRN
Start: 2015-05-27 — End: 2015-06-11
  Administered 2015-06-01: 30 mL via ORAL
  Filled 2015-05-27: qty 30

## 2015-05-27 MED ORDER — FLEET ENEMA 7-19 GM/118ML RE ENEM: 1.0000 | ENEMA | Freq: Once | RECTAL | Status: DC | PRN

## 2015-05-27 MED ORDER — INSULIN ASPART 100 UNIT/ML ~~LOC~~ SOLN
0.0000 [IU] | Freq: Three times a day (TID) | SUBCUTANEOUS | Status: DC
  Administered 2015-05-28: 5 [IU] via SUBCUTANEOUS
  Administered 2015-05-28: 3 [IU] via SUBCUTANEOUS
  Administered 2015-05-28: 5 [IU] via SUBCUTANEOUS
  Administered 2015-05-29 (×3): 3 [IU] via SUBCUTANEOUS
  Administered 2015-05-30: 5 [IU] via SUBCUTANEOUS
  Administered 2015-05-31: 3 [IU] via SUBCUTANEOUS
  Administered 2015-05-31: 2 [IU] via SUBCUTANEOUS
  Administered 2015-05-31 – 2015-06-01 (×2): 3 [IU] via SUBCUTANEOUS
  Administered 2015-06-02: 5 [IU] via SUBCUTANEOUS
  Administered 2015-06-03 – 2015-06-04 (×2): 2 [IU] via SUBCUTANEOUS
  Administered 2015-06-05 – 2015-06-06 (×3): 3 [IU] via SUBCUTANEOUS
  Administered 2015-06-07: 11 [IU] via SUBCUTANEOUS
  Administered 2015-06-08: 5 [IU] via SUBCUTANEOUS
  Administered 2015-06-09 – 2015-06-10 (×3): 3 [IU] via SUBCUTANEOUS
  Administered 2015-06-11: 2 [IU] via SUBCUTANEOUS

## 2015-05-27 MED ORDER — HYDROCODONE-ACETAMINOPHEN 10-325 MG PO TABS
1.0000 | ORAL_TABLET | Freq: Four times a day (QID) | ORAL | Status: DC | PRN
  Administered 2015-05-27 – 2015-06-05 (×24): 1 via ORAL
  Filled 2015-05-27 (×27): qty 1

## 2015-05-27 MED ORDER — ASPIRIN 325 MG PO TABS: 325.0000 mg | ORAL_TABLET | Freq: Every day | ORAL | Status: DC

## 2015-05-27 MED ORDER — INFLUENZA VAC SPLIT QUAD 0.5 ML IM SUSY
0.5000 mL | PREFILLED_SYRINGE | INTRAMUSCULAR | Status: AC
  Administered 2015-05-28: 0.5 mL via INTRAMUSCULAR
  Filled 2015-05-27: qty 0.5

## 2015-05-27 MED ORDER — SIMVASTATIN 20 MG PO TABS
40.0000 mg | ORAL_TABLET | Freq: Every day | ORAL | Status: DC
  Administered 2015-05-27 – 2015-06-10 (×15): 40 mg via ORAL
  Filled 2015-05-27 (×17): qty 2

## 2015-05-27 MED ORDER — ASPIRIN EC 325 MG PO TBEC
325.0000 mg | DELAYED_RELEASE_TABLET | Freq: Every day | ORAL | Status: DC
  Administered 2015-05-28 – 2015-06-11 (×15): 325 mg via ORAL
  Filled 2015-05-27 (×15): qty 1

## 2015-05-27 MED ORDER — ALPRAZOLAM 0.25 MG PO TABS
0.5000 mg | ORAL_TABLET | Freq: Every day | ORAL | Status: DC
  Administered 2015-05-27: 0.5 mg via ORAL
  Administered 2015-05-28 – 2015-06-03 (×8): 1 mg via ORAL
  Administered 2015-06-04 – 2015-06-10 (×8): 0.5 mg via ORAL
  Filled 2015-05-27 (×6): qty 4
  Filled 2015-05-27 (×3): qty 2
  Filled 2015-05-27 (×2): qty 4
  Filled 2015-05-27 (×4): qty 2

## 2015-05-27 MED ORDER — TRAMADOL HCL 50 MG PO TABS
50.0000 mg | ORAL_TABLET | Freq: Four times a day (QID) | ORAL | Status: DC | PRN
Start: 2015-05-27 — End: 2015-06-11
  Administered 2015-05-27 – 2015-06-05 (×9): 50 mg via ORAL
  Filled 2015-05-27 (×9): qty 1

## 2015-05-27 MED ORDER — PROCHLORPERAZINE 25 MG RE SUPP: 12.5000 mg | Freq: Four times a day (QID) | RECTAL | Status: DC | PRN

## 2015-05-27 MED ORDER — INSULIN ASPART 100 UNIT/ML ~~LOC~~ SOLN
3.0000 [IU] | Freq: Three times a day (TID) | SUBCUTANEOUS | Status: DC
  Administered 2015-05-28 – 2015-06-05 (×20): 3 [IU] via SUBCUTANEOUS

## 2015-05-27 MED ORDER — INSULIN ASPART 100 UNIT/ML ~~LOC~~ SOLN
0.0000 [IU] | Freq: Every day | SUBCUTANEOUS | Status: DC
  Administered 2015-05-27: 4 [IU] via SUBCUTANEOUS
  Administered 2015-05-29 – 2015-06-01 (×2): 3 [IU] via SUBCUTANEOUS
  Administered 2015-06-02 – 2015-06-10 (×4): 2 [IU] via SUBCUTANEOUS

## 2015-05-27 MED ORDER — ENOXAPARIN SODIUM 40 MG/0.4ML ~~LOC~~ SOLN
40.0000 mg | SUBCUTANEOUS | Status: DC
  Administered 2015-05-27 – 2015-06-10 (×15): 40 mg via SUBCUTANEOUS
  Filled 2015-05-27 (×15): qty 0.4

## 2015-05-27 MED ORDER — ALPRAZOLAM 0.25 MG PO TABS
0.5000 mg | ORAL_TABLET | Freq: Every day | ORAL | Status: DC
  Administered 2015-05-28 – 2015-06-11 (×14): 0.5 mg via ORAL
  Filled 2015-05-27 (×15): qty 2

## 2015-05-27 MED ORDER — PNEUMOCOCCAL VAC POLYVALENT 25 MCG/0.5ML IJ INJ
0.5000 mL | INJECTION | INTRAMUSCULAR | Status: AC
  Administered 2015-05-28: 0.5 mL via INTRAMUSCULAR
  Filled 2015-05-27: qty 0.5

## 2015-05-27 MED ORDER — GUAIFENESIN-DM 100-10 MG/5ML PO SYRP: 5.0000 mL | ORAL_SOLUTION | Freq: Four times a day (QID) | ORAL | Status: DC | PRN

## 2015-05-27 MED ORDER — METHOCARBAMOL 500 MG PO TABS
500.0000 mg | ORAL_TABLET | Freq: Four times a day (QID) | ORAL | Status: DC | PRN
  Administered 2015-05-27 – 2015-06-04 (×3): 500 mg via ORAL
  Filled 2015-05-27 (×4): qty 1

## 2015-05-27 MED ORDER — INSULIN GLARGINE 100 UNIT/ML ~~LOC~~ SOLN
40.0000 [IU] | Freq: Every day | SUBCUTANEOUS | Status: DC
  Administered 2015-05-27 – 2015-05-28 (×2): 40 [IU] via SUBCUTANEOUS
  Filled 2015-05-27 (×3): qty 0.4

## 2015-05-27 MED ORDER — ACETAMINOPHEN 325 MG PO TABS
325.0000 mg | ORAL_TABLET | ORAL | Status: DC | PRN
  Filled 2015-05-27: qty 2

## 2015-05-27 MED ORDER — LANTUS SOLOSTAR 100 UNIT/ML ~~LOC~~ SOPN: 30.0000 [IU] | PEN_INJECTOR | Freq: Every day | SUBCUTANEOUS | Status: DC

## 2015-05-27 MED ORDER — PROCHLORPERAZINE MALEATE 5 MG PO TABS
5.0000 mg | ORAL_TABLET | Freq: Four times a day (QID) | ORAL | Status: DC | PRN
  Administered 2015-05-28: 10 mg via ORAL
  Filled 2015-05-27: qty 2

## 2015-05-27 MED ORDER — BISACODYL 10 MG RE SUPP
10.0000 mg | Freq: Every day | RECTAL | Status: DC | PRN
  Administered 2015-05-28 – 2015-06-07 (×2): 10 mg via RECTAL
  Filled 2015-05-27 (×2): qty 1

## 2015-05-27 MED ORDER — PROCHLORPERAZINE EDISYLATE 5 MG/ML IJ SOLN: 5.0000 mg | Freq: Four times a day (QID) | INTRAMUSCULAR | Status: DC | PRN

## 2015-05-27 NOTE — Progress Notes (Signed)
Physical Therapy Treatment Patient Details Name: MARYLAND LUPPINO MRN: [id number] DOB: [birth year] Today's Date: 05/27/2015    History of Present Illness 74 y.o. 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 But has bilateral acute infarcts.    PT Comments    Pt remains to have impaired balance, R sided weakness, decreased insight to safety and expressive aphasia. Pt con't to require min/modA for ambulation due to freq episodes of LOB. Con't to recommend CIR for maximal functional recovery.   Follow Up Recommendations  CIR     Equipment Recommendations       Recommendations for Other Services Rehab consult     Precautions / Restrictions Precautions Precautions: Fall Precaution Comments: expressive aphasia Restrictions Weight Bearing Restrictions: No    Mobility  Bed Mobility Overal bed mobility: Needs Assistance Bed Mobility: Supine to Sit     Supine to sit: Min guard     General bed mobility comments: min guard for safety due to impulsivity  Transfers Overall transfer level: Needs assistance Equipment used: 1 person hand held assist Transfers: Sit to/from Stand Sit to Stand: Min assist         General transfer comment: minA due to stagger left/right  Ambulation/Gait Ambulation/Gait assistance: Min assist;Mod assist Ambulation Distance (Feet): 150 Feet Assistive device: None Gait Pattern/deviations: Step-through pattern;Shuffle Gait velocity: dec   General Gait Details: pt at much slower cadence this date and reaching for hallway rails and furniture to hold onto. pt with 2 episodes of LOB during turing to R requring modA to regain balance. pt with slow reactiontime   Stairs            Wheelchair Mobility    Modified Rankin (Stroke Patients Only) Modified Rankin (Stroke Patients Only) Pre-Morbid Rankin Score: Slight disability Modified Rankin: Moderately severe disability     Balance Overall balance assessment: Needs  assistance         Standing balance support: During functional activity Standing balance-Leahy Scale: Poor                      Cognition Arousal/Alertness: Awake/alert Behavior During Therapy: WFL for tasks assessed/performed Overall Cognitive Status: Impaired/Different from baseline Area of Impairment: Problem solving;Safety/judgement;Attention   Current Attention Level: Selective   Following Commands: Follows one step commands with increased time;Follows multi-step commands inconsistently Safety/Judgement: Decreased awareness of safety   Problem Solving: Requires verbal cues      Exercises      General Comments General comments (skin integrity, edema, etc.): pt unable to navigate back to room required max v/c's      Pertinent Vitals/Pain Pain Assessment: No/denies pain    Home Living     Available Help at Discharge: Family;Available 24 hours/day Type of Home: House              Prior Function            PT Goals (current goals can now be found in the care plan section) Progress towards PT goals: Progressing toward goals    Frequency  Min 4X/week    PT Plan Discharge plan needs to be updated    Co-evaluation             End of Session Equipment Utilized During Treatment: Gait belt Activity Tolerance: Patient tolerated treatment well Patient left: in chair;with call bell/phone within reach;with chair alarm set     Time: 4098-1191 PT Time Calculation (min) (ACUTE ONLY): 23 min  Charges:  $Gait Training:  23-37 mins                    G Codes:      Marcene Brawn 05/27/2015, 12:56 PM   Lewis Shock, PT, DPT Pager #: [phone] Office #: [phone]

## 2015-05-27 NOTE — Progress Notes (Signed)
Pt admitted to unit at 1830. Reviewed rehab schedule and safety plan with pt with pt nodding to understanding. Family to arrive this later this evening. Bedalarm on, call bell within reach, SRx3 in place.

## 2015-05-27 NOTE — Progress Notes (Signed)
I met with pt at bedside and then contacted his daughter, [name], by phone. She prefers inpt rehab for her son was previous pt at [facility]. They can provide 24/7 assist for pt at d/c. He was completely independent and driving pta. I discussed with Dr. Nigel Bridgeman and he is ready for d/c. I will seek insurance approval for possible admit today. [phone]

## 2015-05-27 NOTE — PMR Pre-admission (Signed)
PMR Admission Coordinator Pre-Admission Assessment  Patient: Lucas Morgan is an 74 y.o., male MRN: [id number] DOB: 1941/02/19 Height:   Weight: 76.522 kg (168 lb 11.2 oz)              Insurance Information HMO: yes    PPO:      PCP:      IPA:      80/20:      OTHER: Medicare replacement policy PRIMARY: [employer] Medicare      Policy#: [id number]      Subscriber: pt CM Name: Gweneth Dimitri      Phone#: [phone]     Fax#: UHC online portal Pre-Cert#: [id number]      Employer: retired approved for 7 days. F/U CM Amy Coltrane phone [phone] EMR access Benefits:  Phone #: [phone]     Name: 05/27/15 Eff. Date: 04/14/15     Deduct: none      Out of Pocket Max: [phone]      Life Max: none CIR: $430 copay per day days 1-4 then covers 100%      SNF: no copay days 1-20; $160 copay per day days 21-62; no copay days 63-100 Outpatient: $40 copay per visit     Co-Pay: no visit limit Home Health: 100%      Co-Pay: no visit limit DME: 80%     Co-Pay: 20% Providers: in network  SECONDARY: none       Medicaid Application Date:       Case Manager:  Disability Application Date:       Case Worker:  Daughter to check into medicaid eligibility in [place]  Emergency Contact Information Contact Information    Name Relation Home Work Mobile   [place] Daughter [phone]  [phone]   [place],[name] Daughter [phone]  [phone]   [place], [place]  [phone]       Current Medical History  Patient Admitting Diagnosis: embolic cerebral infarcts  History of Present Illness: Lucas Morgan is a 74 y.o. 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 He was readmitted on 16-Jun-2015 with pronounced right sided weakness and garbled speech. He was noted ot be dehydrated with BUN/Cr- 37/1.87 and was started on IVF for hydration. CT head  revealed well defined area of cytotoxic edema left superior frontal cortex and subcortical white matter consistent with late acute cerebral infarction.  MRI/MRA brain done revealing acute/subacute hon-hemorrhagic infarct left frontal and additional areas left parietal and left occipital (question hypoperfusion), right parietal and right occipital lobes with question of embolic source. Age advanced atrophy with moderate distal SVD most evident in R-MCA branches. Carotid dopplers with 1- 39% ICA stenosis bilaterally. Neurology consulted felt that patient had recurrent symptoms due to dehydration and episode of hypotension and recommended work up for embolic source. TEE done revealing EF 30-35%, no PFO or ASD and loop recorder placed by Dr Melburn Popper. 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 Patient with resultant right sided weakness, right inattention, aphasia with expressive > receptive deficits.   Total: 10 NIH    Past Medical History  Past Medical History  Diagnosis Date  . Diabetes mellitus   . Dyslipidemia   . MI (myocardial infarction) 11/09/2008    2.5 x 23 Xience V DES to the CFX  . AAA (abdominal aortic aneurysm)     a. Abd  U/S 7/14: mild aneurysmal dilatation 3x3 cm; cholelithiasis without evid of cholecystitis => repeat 1 year  . Coronary artery disease   . Arthritis     stenosis, lumbar region  . Renal insufficiency   . Stroke     Family History  family history includes CAD in his brother; Heart attack in his brother.  Prior Rehab/Hospitalizations:  Has the patient had major surgery during 100 days prior to admission? No  Current Medications   Current facility-administered medications:  .  acetaminophen (TYLENOL) tablet 650 mg, 650 mg, Oral, Q4H PRN, 650 mg at 05/24/15 2344 **OR** acetaminophen (TYLENOL) suppository 650 mg, 650 mg, Rectal, Q4H PRN, Therisa Doyne, MD .  ALPRAZolam Prudy Feeler) tablet 0.5 mg, 0.5 mg, Oral, BID, Therisa Doyne, MD,  0.5 mg at 05/27/15 0826 .  aspirin suppository 300 mg, 300 mg, Rectal, Daily **OR** aspirin tablet 325 mg, 325 mg, Oral, Daily, Therisa Doyne, MD, 325 mg at 05/27/15 0827 .  heparin injection 5,000 Units, 5,000 Units, Subcutaneous, 3 times per day, Marinus Maw, MD, 5,000 Units at 05/26/15 2222 .  HYDROcodone-acetaminophen (NORCO) 10-325 MG per tablet 1 tablet, 1 tablet, Oral, Q6H PRN, Therisa Doyne, MD, 1 tablet at 05/27/15 0827 .  insulin aspart (novoLOG) injection 0-9 Units, 0-9 Units, Subcutaneous, TID WC, Maretta Bees, MD, 3 Units at 05/27/15 1151 .  insulin glargine (LANTUS) injection 30 Units, 30 Units, Subcutaneous, QHS, Therisa Doyne, MD, 30 Units at 05/26/15 2224 .  ondansetron (ZOFRAN) injection 4 mg, 4 mg, Intravenous, Q6H PRN, Marinus Maw, MD .  senna-docusate (Senokot-S) tablet 1 tablet, 1 tablet, Oral, QHS PRN, Therisa Doyne, MD, 1 tablet at 05/26/15 2222 .  simvastatin (ZOCOR) tablet 40 mg, 40 mg, Oral, QHS, Therisa Doyne, MD, 40 mg at 05/26/15 2222  Patients Current Diet: Diet Heart Room service appropriate?: Yes; Fluid consistency:: Thin Diet - low sodium heart healthy Diet Carb Modified  Precautions / Restrictions Precautions Precautions: Fall Precaution Comments: expressive aphasia Restrictions Weight Bearing Restrictions: No   Has the patient had 2 or more falls or a fall with injury in the past year?No  Prior Activity Level Community (5-7x/wk): Pt independent, driving, cooking, mows, Sales promotion account executive / Equipment Home Equipment: Cane - single point, Shower seat  Prior Device Use: Indicate devices/aids used by the patient prior to current illness, exacerbation or injury? None of the above  Prior Functional Level Prior Function Level of Independence: Independent Comments: daughter reports pt was independent with yard work, cooking, Education officer, environmental, etc   Self Care: Did the patient need help bathing, dressing, using the  toilet or eating?  Independent  Indoor Mobility: Did the patient need assistance with walking from room to room (with or without device)? Independent  Stairs: Did the patient need assistance with internal or external stairs (with or without device)? Independent  Functional Cognition: Did the patient need help planning regular tasks such as shopping or remembering to take medications? Independent  Current Functional Level Cognition  Arousal/Alertness: Awake/alert Overall Cognitive Status: Impaired/Different from baseline Difficult to assess due to: Impaired communication Current Attention Level: Selective Orientation Level: Oriented to place, Oriented to person Following Commands: Follows one step commands with increased time, Follows multi-step commands inconsistently Safety/Judgement: Decreased awareness of safety Attention: Sustained Sustained Attention: Appears intact    Extremity Assessment (includes Sensation/Coordination)  Upper Extremity Assessment: RUE deficits/detail RUE Deficits / Details: Pt able to reach ~70* forward flexion.  elbow flexion ~3/5, pt demonstrates minimal thumb movement and minimal finger flexion  during functional tasks.  Pt does not flex/extend digits on command  RUE Coordination: decreased fine motor, decreased gross motor  Lower Extremity Assessment: Defer to PT evaluation RLE Deficits / Details: Grossly 4/5 strength compared to LLE at 5/5.    ADLs  Overall ADL's : Needs assistance/impaired Eating/Feeding: Moderate assistance, Bed level, Sitting Grooming: Wash/dry hands, Wash/dry face, Brushing hair, Oral care, Moderate assistance, Standing Grooming Details (indicate cue type and reason): Pt requires assist for thoroughness.  Assist to locate items and min A for standing balance  Upper Body Bathing: Moderate assistance, Sitting Lower Body Bathing: Moderate assistance, Sit to/from stand Upper Body Dressing : Moderate assistance, Sitting Lower Body  Dressing: Moderate assistance, Sit to/from stand Lower Body Dressing Details (indicate cue type and reason): Pt able to doff socks with supervision while seated.   Requires mod A to don socks.   Toilet Transfer: Minimal assistance, Moderate assistance, Ambulation, Comfort height toilet Toilet Transfer Details (indicate cue type and reason): Pt ambulated into BR with min A, but lost balance as the turned toward rt requiring mod A to recover  Toileting- Clothing Manipulation and Hygiene: Moderate assistance, Sit to/from stand Functional mobility during ADLs: Minimal assistance, Moderate assistance General ADL Comments: Pt demonstrates Rt inattention requiring mod cues to attend fully to Rt UE (intermittent), and cues to locate items on Rt.     Mobility  Overal bed mobility: Needs Assistance Bed Mobility: Supine to Sit Supine to sit: Min guard Sit to supine: Min guard General bed mobility comments: min guard for safety due to impulsivity    Transfers  Overall transfer level: Needs assistance Equipment used: 1 person hand held assist Transfers: Sit to/from Stand Sit to Stand: Min assist Stand pivot transfers: Mod assist, Min assist General transfer comment: minA due to stagger left/right    Ambulation / Gait / Stairs / Wheelchair Mobility  Ambulation/Gait Ambulation/Gait assistance: Min assist, Mod assist Ambulation Distance (Feet): 150 Feet Assistive device: None Gait Pattern/deviations: Step-through pattern, Shuffle General Gait Details: pt at much slower cadence this date and reaching for hallway rails and furniture to hold onto. pt with 2 episodes of LOB during turing to R requring modA to regain balance. pt with slow reactiontime Gait velocity: dec Gait velocity interpretation: Below normal speed for age/gender Stairs: Yes Stairs assistance: Min assist Stair Management: Two rails Number of Stairs: 10 General stair comments: requires 2 rails and minA to maintain balance due to  difficulty sequencing    Posture / Balance Dynamic Sitting Balance Sitting balance - Comments: Pt occasionally loses balance to Rt when attempting to don/doff socks  Balance Overall balance assessment: Needs assistance Sitting-balance support: Feet supported Sitting balance-Leahy Scale: Fair Sitting balance - Comments: Pt occasionally loses balance to Rt when attempting to don/doff socks  Standing balance support: During functional activity Standing balance-Leahy Scale: Poor Standing balance comment: Pt requires min guard to min A for standing balance.  Intermittently loses balance during grooming and dressing tasks     Special needs/care consideration Skin LOOP recorder implanted 9/12 Bowel mgmt: LBM 9/12 continent Bladder mgmt: continent Diabetic mgmt yes   Previous Home Environment Living Arrangements: Children  Lives With: Daughter Available Help at Discharge: Family, Available 24 hours/day Type of Home: House Home Layout: One level Home Access: Level entry Bathroom Shower/Tub: Engineer, manufacturing systems: Standard Bathroom Accessibility: Yes How Accessible: Accessible via walker Home Care Services: Yes Type of Home Care Services: Home RN, Home PT, Other (Comment) (SLP) Home Care Agency (if known): [facility]  Discharge Living Setting Plans for Discharge Living Setting: House, Lives with (comment), Other (Comment) (dtr, [name]) Type of Home at Discharge: House Discharge Home Layout: One level Discharge Home Access: Level entry Discharge Bathroom Shower/Tub: Tub/shower unit Discharge Bathroom Toilet: Standard Discharge Bathroom Accessibility: Yes How Accessible: Accessible via walker Does the patient have any problems obtaining your medications?: No  Social/Family/Support Systems Patient Roles: Parent Contact Information: Geophysical data processor, daughters Anticipated Caregiver: daughters and other family members Anticipated Industrial/product designer Information:  see above Ability/Limitations of Caregiver: daughters both work, but will arrange 24/7 supervision at d/c Caregiver Availability: 24/7 Discharge Plan Discussed with Primary Caregiver: Yes Is Caregiver In Agreement with Plan?: Yes Does Caregiver/Family have Issues with Lodging/Transportation while Pt is in Rehab?: No  Goals/Additional Needs Patient/Family Goal for Rehab: supervision with PT, supervision to min assist with OT and SLP Expected length of stay: ELOS 14-18 days Pt/Family Agrees to Admission and willing to participate: Yes Program Orientation Provided & Reviewed with Pt/Caregiver Including Roles  & Responsibilities: Yes  Decrease burden of Care through IP rehab admission: n/a  Possible need for SNF placement upon discharge: not anticipated  Patient Condition: This patient's medical and functional status are the same since consult dated: 05/26/2015 in which the Rehabilitation Physician determined and documented that the patient's condition is appropriate for intensive rehabilitative care in an inpatient rehabilitation facility. Functional changes are: overall min to mod assist.. After evaluating the patient today and speaking with the Rehabilitation physician and acute team, the patient remains appropriate for inpatient rehab. Will admit to inpatient rehab today.  Preadmission Screen Completed By:  Clois Dupes, 05/27/2015 3:48 PM ______________________________________________________________________   Discussed status with Dr. Laruth Bouchard on 05/27/2015 at  1547 and received telephone approval for admission today.  Admission Coordinator:  Clois Dupes, time [id number] Date 05/27/2015.

## 2015-05-27 NOTE — Progress Notes (Signed)
I have insurance approval and will arrange admission to inpt rehab today. Dr. Dina Rich, RN CM and SW made aware. I will contact his daughter, Eunice Blase, also. [phone]

## 2015-05-27 NOTE — Interval H&P Note (Signed)
MAHAD NEWSTROM was admitted today to Inpatient Rehabilitation with the diagnosis of CVA.  The patient's history has been reviewed, patient examined, and there is no change in status.  Patient continues to be appropriate for intensive inpatient rehabilitation.  I have reviewed the patient's chart and labs.  Questions were answered to the patient's satisfaction. The PAPE has been reviewed and assessment remains appropriate.  SWARTZ,ZACHARY T 05/27/2015, 9:09 PM

## 2015-05-27 NOTE — Progress Notes (Signed)
Physical Medicine and Rehabilitation Consult   Reason for Consult: Ataxic gait and slurred speech Referring Physician: Dr. Carolin Coy.    HPI: Lucas Morgan is a 74 y.o. 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 He was readmitted on 07-Jun-2015 with pronounced right sided weakness and garbled speech. CT head revealed well defined area of cytotoxic edema left superior frontal cortex and subcortical white matter. MRI/MRA brain done revealing acute/subacute hon-hemorrhagic infarct left frontal and additional areas left parietal and left occipital (question hypoperfusion), right parietal and right occipital lobes with question of embolic source. Age advanced atrophy with moderate distal SVD most evident in R-MCA branches. Neurology consulted and recommended TEE as well as loop recorder for work up of embolic source. He is to continue on ASA for secondary stroke prevention. PT evaluations done this weekend and CIR recommended for follow up therapy.    Review of Systems  Unable to perform ROS: language  Respiratory: Negative for shortness of breath.  Cardiovascular: Negative for chest pain.  Neurological: Positive for speech change and focal weakness.  Psychiatric/Behavioral: Positive for depression. The patient is nervous/anxious.      Past Medical History  Diagnosis Date  . Diabetes mellitus   . Dyslipidemia   . MI (myocardial infarction) feb 27/10  . AAA (abdominal aortic aneurysm)     a. Abd U/S 7/14: mild aneurysmal dilatation 3x3 cm; cholelithiasis without evid of cholecystitis => repeat 1 year  . Coronary artery disease   . Arthritis     stenosis, lumbar region  . Renal insufficiency   . Stroke     Past Surgical History  Procedure Laterality Date  . Knee surgery    .  Coronary stent placement    . Back surgery  2015    lumbar fusion  . Joint replacement Left   . 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 Lumbar laminectomy/decompression microdiscectomy N/A 10/18/2014    Procedure: LUMBAR LAMINECTOMY/DECOMPRESSION MICRODISCECTOMYLUMBAR THREE-FOUR ; Surgeon: Temple Pacini, MD; Location: [facility]; Service: Neurosurgery; Laterality: N/A;  . Coronary angioplasty      Family History  Problem Relation Age of Onset  . Heart attack Brother     x2 brothers  . CAD Brother     Social History: Indicated that he lives with a daughter. Per reports that he quit smoking about 21 years ago. He has never used smokeless tobacco. Per reports that he does not drink alcohol or use illicit drugs.     Allergies  Allergen Reactions  . Trazodone And Nefazodone Other (See Comments)    High blood sugar    Medications Prior to Admission  Medication Sig Dispense Refill  . ALPRAZolam (XANAX) 0.5 MG tablet Take 0.5 mg by mouth 2 (two) times daily.   0  . aspirin 81 MG tablet Take 81 mg by mouth daily.     Marland Kitchen glipiZIDE (GLUCOTROL) 10 MG tablet Take 10 mg by mouth daily before breakfast.     . HYDROcodone-acetaminophen (NORCO) 10-325 MG per tablet Take 1 tablet by mouth every 6 (six) hours as needed (pain). 60 tablet 0  . LANTUS SOLOSTAR 100 UNIT/ML Solostar Pen Inject 75 Units into the skin at bedtime.   0  . lisinopril (PRINIVIL,ZESTRIL) 2.5 MG tablet Take 2.5 mg by mouth daily.    Marland Kitchen  metroNIDAZOLE (FLAGYL) 500 MG tablet Take 500 mg by mouth 3 (three) times daily.     . simvastatin (ZOCOR) 40 MG tablet Take 40 mg by mouth at bedtime.  6  . ondansetron (ZOFRAN) 4 MG tablet Take 1 tablet (4 mg total) by mouth every 6 (six) hours. (Patient not taking: Reported on 04/08/2015) 12 tablet 0    Home: Home Living Family/patient expects to  be discharged to:: Unsure Living Arrangements: Children (daughter) Available Help at Discharge: Family, Available 24 hours/day (initially) Type of Home: House Home Access: Level entry Home Layout: One level Home Equipment: Cane - single point, Banker History: Prior Function Level of Independence: Independent Functional Status:  Mobility: Bed Mobility Overal bed mobility: Needs Assistance Bed Mobility: Supine to Sit Supine to sit: Min guard General bed mobility comments: Min guard for safety. Cues for use of RUE as able. Did not require physical assist but uses rail. Transfers Overall transfer level: Needs assistance Equipment used: 1 person hand held assist Transfers: Sit to/from Stand Sit to Stand: Min assist General transfer comment: Min assist for balance upon rising, Moderate sway, reaching for rail.  Ambulation/Gait Ambulation/Gait assistance: Mod assist Ambulation Distance (Feet): 75 Feet Assistive device: 1 person hand held assist Gait Pattern/deviations: Step-through pattern, Decreased stride length, Scissoring, Ataxic, Staggering left, Staggering right, Narrow base of support General Gait Details: Most of bout at min assist level for stability however a couple of instances of staggering and LOB to left and right requiring mod assist to correct. Cues for awareness and correction with wide base of support, especially with turns.  Gait velocity: Decreased Gait velocity interpretation: Below normal speed for age/gender    ADL:    Cognition: Cognition Overall Cognitive Status: Difficult to assess Orientation Level: Oriented X4 (able to nod yes and no) Cognition Arousal/Alertness: Awake/alert Behavior During Therapy: WFL for tasks assessed/performed Overall Cognitive Status: Difficult to assess Area of Impairment: Problem solving, Following commands Following Commands: Follows one step commands with increased time Problem Solving: Slow processing,  Difficulty sequencing, Requires verbal cues Difficult to assess due to: Impaired communication  Blood pressure 128/68, pulse 83, temperature 98.9 F (37.2 C), temperature source Oral, resp. rate 18, weight 76.522 kg (168 lb 11.2 oz), SpO2 98 %. Physical Exam  Nursing note and vitals reviewed. Constitutional: He is oriented to person, place, and time. He appears well-developed and well-nourished.  Tearful during the exam and needed redirection.  HENT:  Head: Normocephalic and atraumatic.  Eyes: Conjunctivae are normal. Pupils are equal, round, and reactive to light.  Neck: Normal range of motion. Neck supple.  Cardiovascular: Normal rate and regular rhythm.  Respiratory: Effort normal and breath sounds normal. No respiratory distress.  GI: Soft. Bowel sounds are normal. He exhibits no distension. There is no tenderness.  Musculoskeletal: He exhibits no edema or tenderness.  Neurological: He is alert and oriented to person, place, and time.  Expressive aphasia with dysarthria. Able to answer simple Y/N questions only. Emotional. He was distracted and impulsive needing redirection to attend to task. Able to follow simple motor commands but breaks down with 2 step commands. Right sided weakness with question right inattention.  Skin: Skin is warm and dry. No erythema.  Psychiatric: His speech is slurred. He expresses impulsivity. He exhibits a depressed mood.     Lab Results Last 24 Hours    Results for orders placed or performed during the hospital encounter of 05/24/15 (from the past 24 hour(s))  Glucose, capillary Status: Abnormal  Collection Time: 05/25/15 4:33 PM  Result Value Ref Range   Glucose-Capillary 212 (H) 65 - 99 mg/dL  Glucose, capillary Status: Abnormal   Collection Time: 05/25/15 9:52 PM  Result Value Ref Range   Glucose-Capillary 261 (H) 65 - 99 mg/dL   Comment 1 Notify RN    Comment 2 Document in Chart   Glucose,  capillary Status: Abnormal   Collection Time: 05/26/15 6:30 AM  Result Value Ref Range   Glucose-Capillary 123 (H) 65 - 99 mg/dL   Comment 1 Notify RN    Comment 2 Document in Chart       Imaging Results (Last 48 hours)    Dg Chest 2 View  05/25/2015 CLINICAL DATA: Cough. Recent stroke. EXAM: CHEST 2 VIEW COMPARISON: 10/18/2014 FINDINGS: The patient is mildly rotated to the right which partially limits evaluation of the mediastinum. Cardiomediastinal silhouette is grossly unchanged, with cardiac silhouette upper limits of normal in size. The patient has taken a slightly shallower inspiration than on the prior study. Chronic interstitial coarsening is again seen and may reflect underlying COPD/chronic bronchitis. There is slightly increased heterogeneous opacity in the right lung base which projects posteriorly on the lateral image. No pleural effusion or pneumothorax is identified. Degenerative changes are noted at the [facility] joints. IMPRESSION: COPD with mild right basilar infiltrate versus atelectasis. Electronically Signed By: Sebastian Ache M.D. On: 05/25/2015 12:17   Ct Head Wo Contrast  05/24/2015 CLINICAL DATA: Code stroke. RIGHT-sided weakness. Symptoms began 4 days ago. EXAM: CT HEAD WITHOUT CONTRAST TECHNIQUE: Contiguous axial images were obtained from the base of the skull through the vertex without intravenous contrast. COMPARISON: CT head 06/10/2014. MR brain 06/10/2014. FINDINGS: Well-defined area of cytotoxic edema affects the LEFT superior frontal cortex and subcortical white matter consistent with an ischemic event onset 4 days ago. No hemorrhagic transformation. No other areas of acute infarction are suspected. No CT findings to suggest proximal vascular thrombosis. Mild atrophy. Chronic LEFT frontal and parietal subcortical infarction related to previous LEFT MCA territory ischemic insult documented on September 2015 MR. An additional LEFT  parietal insult predated that stroke. Hypoattenuation of the periventricular and subcortical regions consistent with small vessel disease. No mass lesion, hydrocephalus, or extra-axial fluid. Calvarium intact. No sinus or mastoid air fluid level. IMPRESSION: Well-defined area of cytotoxic edema affecting the LEFT superior frontal cortex and subcortical white matter consistent with a late acute cerebral infarction. No features to suggest proximal vascular occlusion or hemorrhagic transformation. Chronic changes as described. Critical Value/emergent results were called by telephone at the time of interpretation on 05/24/2015 at 6:00 pm to Dr. Nelva Nay , who verbally acknowledged these results. Electronically Signed By: Elsie Stain M.D. On: 05/24/2015 18:01   Mr Brain Wo Contrast  05/25/2015 CLINICAL DATA: Recent left-sided infarct with right-sided weakness and aphasia. The weakness has improved but the aphasia continues. The patient was discharged from an outside hospital 2 days ago. EXAM: MRI HEAD WITHOUT CONTRAST MRA HEAD WITHOUT CONTRAST TECHNIQUE: Multiplanar, multiecho pulse sequences of the brain and surrounding structures were obtained without intravenous contrast. Angiographic images of the head were obtained using MRA technique without contrast. COMPARISON: None. CT head without contrast 05/24/2015. FINDINGS: MRI HEAD FINDINGS The acute/subacute cortical and subcortical infarct in the high left frontal lobe is confirmed. This involves the [employer]. Additional scattered foci are present in the frontal lobe and parietal lobe on the left and what appears to be watershed distribution. There is additional involvement of the more superior and  medial primary motor cortex. Portions of the inferior parietal and left occipital lobe are noted as well. Additionally, at least 4 punctate areas of restricted diffusion are present in the right parietal and medial occipital  lobe. There is no hemorrhage associated with these lesions. T2 changes are evident within the areas of acute/subacute infarct. Moderate periventricular and subcortical T2 changes are additionally present bilaterally. Brainstem is unremarkable. Flow is present in the major intracranial arteries. The study is moderately degraded by patient motion and could not be completed. The left globe is collapsed with increased signal. The right globe is intact. The orbits are otherwise unremarkable. The paranasal sinuses and mastoid air cells are clear. Midline structures are unremarkable. MRA HEAD FINDINGS The internal carotid arteries are within normal limits from the high cervical segments through the ICA termini bilaterally. Moderate narrowing is present in the mid right A1 segment. More mild narrowing is present in the mid left A1 segment. The M1 segments are intact. The left ACA is duplicated. The anterior communicating artery appears to be patent. The MCA bifurcations are intact bilaterally. There is moderate attenuation of MCA branch vessels distally, right greater than left. The right vertebral artery terminates at the PICA. The left vertebral artery is within normal limits. The PICA origin is visualized and normal. The basilar artery is within normal limits. Both posterior cerebral arteries originate from the basilar tip. There is some attenuation of distal PCA branch vessels. IMPRESSION: 1. Acute/subacute nonhemorrhagic infarct involving the left frontal lobe to level of the precentral gyrus. 2. Additional smaller non confluent punctate areas of infarction within the more anterior left frontal lobe, the left parietal lobe, and left occipital lobe seen to follow a watershed distribution. This suggests more proximal disease, potentially in the neck, or and episode of hypotension. 3. At least 4 punctate foci of acute nonhemorrhagic infarct are noted within the right parietal and occipital lobe. Given the bilateral  distribution, a central embolic source also needs to be considered. 4. Age advanced atrophy and diffuse white matter disease. 5. Moderate right and mild left mid A1 segment stenoses. 6. Moderate distal small vessel disease, most evident within the right MCA branch vessels. Electronically Signed By: Marin Roberts M.D. On: 05/25/2015 10:41   Mr Maxine Glenn Head/brain Wo Cm  05/25/2015 CLINICAL DATA: Recent left-sided infarct with right-sided weakness and aphasia. The weakness has improved but the aphasia continues. The patient was discharged from an outside hospital 2 days ago. EXAM: MRI HEAD WITHOUT CONTRAST MRA HEAD WITHOUT CONTRAST TECHNIQUE: Multiplanar, multiecho pulse sequences of the brain and surrounding structures were obtained without intravenous contrast. Angiographic images of the head were obtained using MRA technique without contrast. COMPARISON: None. CT head without contrast 05/24/2015. FINDINGS: MRI HEAD FINDINGS The acute/subacute cortical and subcortical infarct in the high left frontal lobe is confirmed. This involves the [employer]. Additional scattered foci are present in the frontal lobe and parietal lobe on the left and what appears to be watershed distribution. There is additional involvement of the more superior and medial primary motor cortex. Portions of the inferior parietal and left occipital lobe are noted as well. Additionally, at least 4 punctate areas of restricted diffusion are present in the right parietal and medial occipital lobe. There is no hemorrhage associated with these lesions. T2 changes are evident within the areas of acute/subacute infarct. Moderate periventricular and subcortical T2 changes are additionally present bilaterally. Brainstem is unremarkable. Flow is present in the major intracranial arteries. The study is moderately degraded by  patient motion and could not be completed. The left globe is collapsed with increased signal. The  right globe is intact. The orbits are otherwise unremarkable. The paranasal sinuses and mastoid air cells are clear. Midline structures are unremarkable. MRA HEAD FINDINGS The internal carotid arteries are within normal limits from the high cervical segments through the ICA termini bilaterally. Moderate narrowing is present in the mid right A1 segment. More mild narrowing is present in the mid left A1 segment. The M1 segments are intact. The left ACA is duplicated. The anterior communicating artery appears to be patent. The MCA bifurcations are intact bilaterally. There is moderate attenuation of MCA branch vessels distally, right greater than left. The right vertebral artery terminates at the PICA. The left vertebral artery is within normal limits. The PICA origin is visualized and normal. The basilar artery is within normal limits. Both posterior cerebral arteries originate from the basilar tip. There is some attenuation of distal PCA branch vessels. IMPRESSION: 1. Acute/subacute nonhemorrhagic infarct involving the left frontal lobe to level of the precentral gyrus. 2. Additional smaller non confluent punctate areas of infarction within the more anterior left frontal lobe, the left parietal lobe, and left occipital lobe seen to follow a watershed distribution. This suggests more proximal disease, potentially in the neck, or and episode of hypotension. 3. At least 4 punctate foci of acute nonhemorrhagic infarct are noted within the right parietal and occipital lobe. Given the bilateral distribution, a central embolic source also needs to be considered. 4. Age advanced atrophy and diffuse white matter disease. 5. Moderate right and mild left mid A1 segment stenoses. 6. Moderate distal small vessel disease, most evident within the right MCA branch vessels. Electronically Signed By: Marin Roberts M.D. On: 05/25/2015 10:41     Assessment/Plan: Diagnosis: embolic cerebral infarcts 1. Does the  need for close, 24 hr/day medical supervision in concert with the patient's rehab needs make it unreasonable for this patient to be served in a less intensive setting? Yes 2. Co-Morbidities requiring supervision/potential complications: dm2, cad, 3. Due to bladder management, bowel management, safety, skin/wound care, disease management, medication administration, pain management and patient education, does the patient require 24 hr/day rehab nursing? Yes 4. Does the patient require coordinated care of a physician, rehab nurse, PT (1-2 hrs/day, 5 days/week), OT (1-2 hrs/day, 5 days/week) and SLP (1-2 hrs/day, 5 days/week) to address physical and functional deficits in the context of the above medical diagnosis(es)? Yes Addressing deficits in the following areas: balance, endurance, locomotion, strength, transferring, bowel/bladder control, bathing, dressing, feeding, grooming, toileting, cognition and psychosocial support 5. Can the patient actively participate in an intensive therapy program of at least 3 hrs of therapy per day at least 5 days per week? Yes 6. The potential for patient to make measurable gains while on inpatient rehab is excellent 7. Anticipated functional outcomes upon discharge from inpatient rehab are supervision with PT, supervision and min assist with OT, supervision and min assist with SLP. 8. Estimated rehab length of stay to reach the above functional goals is: 14-18 days 9. Does the patient have adequate social supports and living environment to accommodate these discharge functional goals? Yes and Potentially 10. Anticipated D/C setting: Home 11. Anticipated post D/C treatments: HH therapy and Outpatient therapy 12. Overall Rehab/Functional Prognosis: excellent  RECOMMENDATIONS: This patient's condition is appropriate for continued rehabilitative care in the following setting: CIR Patient has agreed to participate in recommended program. Potentially Note that insurance  prior authorization may be required for reimbursement  for recommended care.  Comment: Rehab Admissions Coordinator to follow up.  Thanks,  Ranelle Oyster, MD, [facility]     05/26/2015       Revision History     Date/Time User Provider Type Action   05/26/2015 9:45 AM Ranelle Oyster, MD Physician Sign   05/26/2015 9:04 AM Jacquelynn Cree, PA-C Physician Assistant Share   View Details Report       Routing History     Date/Time From To Method   05/26/2015 9:45 AM Ranelle Oyster, MD Ranelle Oyster, MD In Basket   05/26/2015 9:45 AM Ranelle Oyster, MD Abran Richard, PA-C Fax

## 2015-05-27 NOTE — H&P (View-Only) (Signed)
  Physical Medicine and Rehabilitation Admission H&P    Chief Complaint  Patient presents with  . Right sided weakness and difficulty speaking   HPI:   Lucas Morgan is a 74 y.o. 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 He was readmitted on 05/24/15 with pronounced right sided weakness and garbled speech. He was noted ot be dehydrated with BUN/Cr- 37/1.87 and was started on IVF for hydration. CT head revealed well defined area of cytotoxic edema left superior frontal cortex and subcortical white matter consistent with late acute cerebral infarction.  MRI/MRA brain done revealing acute/subacute hon-hemorrhagic infarct left frontal and additional areas left parietal and left occipital (question hypoperfusion), right parietal and right occipital lobes with question of embolic source. Age advanced atrophy with moderate distal SVD most evident in R-MCA branches. Carotid dopplers with 1- 39% ICA stenosis bilaterally. Neurology consulted felt that patient had recurrent symptoms due to dehydration and episode of hypotension and recommended work up for embolic source. TEE done revealing EF 30-35%, no PFO or ASD and loop recorder placed by Dr Nasher.  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 Patient with resultant right sided weakness, right inattention, aphasia with expressive > receptive deficits. Therapy ongoing and CIR was recommended by MD and Rehab team.     Review of Systems  Eyes: Negative for blurred vision, double vision and pain.  Respiratory: Negative for cough, sputum production and shortness of breath.   Cardiovascular: Negative for chest pain, palpitations and leg swelling.  Gastrointestinal: Negative for heartburn, vomiting and abdominal pain.  Genitourinary: Negative for dysuria.    Musculoskeletal: Positive for myalgias and back pain (chronic low back pain).  Neurological: Positive for sensory change, speech change, focal weakness and weakness. Negative for headaches.  Psychiatric/Behavioral: Positive for depression. The patient is nervous/anxious and has insomnia (due to anxiety).       Past Medical History  Diagnosis Date  . Diabetes mellitus   . Dyslipidemia   . MI (myocardial infarction) 11/09/2008    2.5 x 23 Xience V DES to the CFX  . AAA (abdominal aortic aneurysm)     a. Abd U/S 7/14: mild aneurysmal dilatation 3x3 cm; cholelithiasis without evid of cholecystitis => repeat 1 year  . Coronary artery disease   . Arthritis     stenosis, lumbar region  . Renal insufficiency   . Stroke     Past Surgical History  Procedure Laterality Date  . Knee surgery    . Coronary stent placement    . Back surgery  2015    lumbar fusion  . Joint replacement Left   . 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 Lumbar laminectomy/decompression microdiscectomy N/A 10/18/2014    Procedure: LUMBAR LAMINECTOMY/DECOMPRESSION MICRODISCECTOMYLUMBAR THREE-FOUR ;  Surgeon: Henry A Pool, MD;  Location: [facility];  Service: Neurosurgery;  Laterality: N/A;  . Coronary angioplasty    . Ep implantable device N/A 05/26/2015    Procedure: Loop Recorder Insertion;  Surgeon: Gregg W Taylor, MD;  Location: [facility];  Service: Cardiovascular;  Laterality: N/A;    Family History  Problem Relation Age of Onset  . Heart attack Brother     x2 brothers  . CAD Brother     Social History:    reports that he quit smoking about 21 years ago. He has never used smokeless tobacco. He reports that he does not drink alcohol or use illicit drugs.     Allergies  Allergen Reactions  . Trazodone And Nefazodone Other (See Comments)    High blood sugar     Medications Prior to Admission  Medication Sig Dispense Refill  . ALPRAZolam (XANAX) 0.5 MG tablet  Take 0.5 mg by mouth 2 (two) times daily.   0  . aspirin 81 MG tablet Take 81 mg by mouth daily.      . glipiZIDE (GLUCOTROL) 10 MG tablet Take 10 mg by mouth daily before breakfast.     . HYDROcodone-acetaminophen (NORCO) 10-325 MG per tablet Take 1 tablet by mouth every 6 (six) hours as needed (pain). 60 tablet 0  . lisinopril (PRINIVIL,ZESTRIL) 2.5 MG tablet Take 2.5 mg by mouth daily.    . metroNIDAZOLE (FLAGYL) 500 MG tablet Take 500 mg by mouth 3 (three) times daily.     . simvastatin (ZOCOR) 40 MG tablet Take 40 mg by mouth at bedtime.  6  . [DISCONTINUED] LANTUS SOLOSTAR 100 UNIT/ML Solostar Pen Inject 75 Units into the skin at bedtime.   0  . ondansetron (ZOFRAN) 4 MG tablet Take 1 tablet (4 mg total) by mouth every 6 (six) hours. (Patient not taking: Reported on 04/08/2015) 12 tablet 0    Home: Home Living Family/patient expects to be discharged to:: Inpatient rehab Living Arrangements: Children (daughter) Available Help at Discharge: Family, Available 24 hours/day (initially) Type of Home: House Home Access: Level entry Home Layout: One level Home Equipment: Cane - single point, Shower seat   Functional History: Prior Function Level of Independence: Independent Comments: daughter reports pt was independent with yard work, cooking, cleaning, etc   Functional Status:  Mobility: Bed Mobility Overal bed mobility: Needs Assistance Bed Mobility: Sit to Supine Supine to sit: Min guard Sit to supine: Min guard General bed mobility comments: sitting up in chair Transfers Overall transfer level: Needs assistance Equipment used: 1 person hand held assist Transfers: Sit to/from Stand Sit to Stand: Min assist Stand pivot transfers: Mod assist, Min assist General transfer comment: pt requires min/modA when turning due to LOB Ambulation/Gait Ambulation/Gait assistance: Min assist, Mod assist Ambulation Distance (Feet): 150 Feet Assistive device: 1 person hand held assist Gait  Pattern/deviations: Step-through pattern General Gait Details: pt with decreased R LE foot clearance with staggering to the R, worsening with increase distance due to onset of fatigue. Pt with scissored gait pattern occasionally to correct balance. Gait velocity: Decreased Gait velocity interpretation: Below normal speed for age/gender Stairs: Yes Stairs assistance: Min assist Stair Management: Two rails Number of Stairs: 10 General stair comments: requires 2 rails and minA to maintain balance due to difficulty sequencing    ADL: ADL Overall ADL's : Needs assistance/impaired Eating/Feeding: Moderate assistance, Bed level, Sitting Grooming: Wash/dry hands, Wash/dry face, Brushing hair, Oral care, Moderate assistance, Standing Grooming Details (indicate cue type and reason): Pt requires assist for thoroughness.  Assist to locate items and min A for standing balance  Upper Body Bathing: Moderate assistance, Sitting Lower Body Bathing: Moderate assistance, Sit to/from stand Upper Body Dressing : Moderate assistance, Sitting Lower Body Dressing: Moderate assistance, Sit to/from stand Lower Body Dressing Details (indicate cue type and reason): Pt able to doff socks with supervision while seated.   Requires mod A to don socks.   Toilet Transfer: Minimal assistance, Moderate assistance, Ambulation, Comfort height toilet Toilet Transfer   Details (indicate cue type and reason): Pt ambulated into BR with min A, but lost balance as the turned toward rt requiring mod A to recover  Toileting- Clothing Manipulation and Hygiene: Moderate assistance, Sit to/from stand Functional mobility during ADLs: Minimal assistance, Moderate assistance General ADL Comments: Pt demonstrates Rt inattention requiring mod cues to attend fully to Rt UE (intermittent), and cues to locate items on Rt.   Cognition: Cognition Overall Cognitive Status: Difficult to assess Orientation Level: Oriented to person, Oriented to  place Cognition Arousal/Alertness: Awake/alert Behavior During Therapy: Impulsive Overall Cognitive Status: Difficult to assess Area of Impairment: Problem solving, Safety/judgement, Attention Current Attention Level: Selective Following Commands: Follows one step commands with increased time, Follows multi-step commands inconsistently Safety/Judgement: Decreased awareness of safety Problem Solving: Requires verbal cues Difficult to assess due to: Impaired communication     Blood pressure 103/62, pulse 81, temperature 98 F (36.7 C), temperature source Oral, resp. rate 20, weight 76.522 kg (168 lb 11.2 oz), SpO2 98 %. Physical Exam  Nursing note and vitals reviewed. Constitutional: He is oriented to person, place, and time. He appears well-developed and well-nourished.  HENT:  Head: Normocephalic and atraumatic.  Eyes: Conjunctivae are normal. Pupils are equal, round, and reactive to light.  Neck: Normal range of motion. Neck supple. No JVD present. No tracheal deviation present. No thyromegaly present.  Cardiovascular: Normal rate and regular rhythm.  Exam reveals no gallop and no friction rub.   No murmur heard. Respiratory: Effort normal and breath sounds normal. No respiratory distress. He has no wheezes. He has no rales. He exhibits no tenderness.  GI: Soft. Bowel sounds are normal. He exhibits no distension. There is no tenderness.  Musculoskeletal: He exhibits no edema or tenderness.  Neurological: He is alert and oriented to person, place, and time.  Right facial weakness and tongue deviation with severe dysarthria. Apparent right hemianopsia. Able to answer basic Y/N questions.  Able to follow simple one and two step commands.  Right sided weakness with sensory deficits UE> LE. RUE: 2 to 2+/5 deltoid, bicep, tricep, wrist, HI.  RLE: 3+ to 4- hf, 4/5 KE and ADF/APF. LUE and LLE grossly 4+/5 prox to distal. Senses pain grossly in RUE, more sensitive to pain in RLE. DTR's 1+    Skin: Skin is warm and dry. No erythema.  Psychiatric: His mood appears anxious. His affect is labile. His speech is slurred. He is slowed.    Results for orders placed or performed during the hospital encounter of 05/24/15 (from the past 48 hour(s))  Glucose, capillary     Status: Abnormal   Collection Time: 05/25/15  4:33 PM  Result Value Ref Range   Glucose-Capillary 212 (H) 65 - 99 mg/dL  Glucose, capillary     Status: Abnormal   Collection Time: 05/25/15  9:52 PM  Result Value Ref Range   Glucose-Capillary 261 (H) 65 - 99 mg/dL   Comment 1 Notify RN    Comment 2 Document in Chart   Glucose, capillary     Status: Abnormal   Collection Time: 05/26/15  6:30 AM  Result Value Ref Range   Glucose-Capillary 123 (H) 65 - 99 mg/dL   Comment 1 Notify RN    Comment 2 Document in Chart   Glucose, capillary     Status: Abnormal   Collection Time: 05/26/15 12:28 PM  Result Value Ref Range   Glucose-Capillary 107 (H) 65 - 99 mg/dL  CBC     Status: Abnormal   Collection  Time: 05/26/15  1:47 PM  Result Value Ref Range   WBC 11.6 (H) 4.0 - 10.5 K/uL   RBC 5.09 4.22 - 5.81 MIL/uL   Hemoglobin 16.3 13.0 - 17.0 g/dL   HCT 45.7 39.0 - 52.0 %   MCV 89.8 78.0 - 100.0 fL   MCH 32.0 26.0 - 34.0 pg   MCHC 35.7 30.0 - 36.0 g/dL   RDW 12.3 11.5 - 15.5 %   Platelets 201 150 - 400 K/uL  Creatinine, serum     Status: Abnormal   Collection Time: 05/26/15  1:47 PM  Result Value Ref Range   Creatinine, Ser 1.39 (H) 0.61 - 1.24 mg/dL   GFR calc non Af Amer 49 (L) >60 mL/min   GFR calc Af Amer 56 (L) >60 mL/min    Comment: (NOTE) The eGFR has been calculated using the CKD EPI equation. This calculation has not been validated in all clinical situations. eGFR's persistently <60 mL/min signify possible Chronic Kidney Disease.   Glucose, capillary     Status: Abnormal   Collection Time: 05/26/15  4:58 PM  Result Value Ref Range   Glucose-Capillary 242 (H) 65 - 99 mg/dL   Comment 1 Notify RN     Comment 2 Document in Chart   Glucose, capillary     Status: Abnormal   Collection Time: 05/26/15  9:40 PM  Result Value Ref Range   Glucose-Capillary 243 (H) 65 - 99 mg/dL   Comment 1 Notify RN    Comment 2 Document in Chart   Basic metabolic panel     Status: Abnormal   Collection Time: 05/27/15  6:37 AM  Result Value Ref Range   Sodium 134 (L) 135 - 145 mmol/L   Potassium 4.5 3.5 - 5.1 mmol/L   Chloride 100 (L) 101 - 111 mmol/L   CO2 26 22 - 32 mmol/L   Glucose, Bld 171 (H) 65 - 99 mg/dL   BUN 19 6 - 20 mg/dL   Creatinine, Ser 1.31 (H) 0.61 - 1.24 mg/dL   Calcium 9.1 8.9 - 10.3 mg/dL   GFR calc non Af Amer 52 (L) >60 mL/min   GFR calc Af Amer >60 >60 mL/min    Comment: (NOTE) The eGFR has been calculated using the CKD EPI equation. This calculation has not been validated in all clinical situations. eGFR's persistently <60 mL/min signify possible Chronic Kidney Disease.    Anion gap 8 5 - 15  CBC     Status: None   Collection Time: 05/27/15  6:37 AM  Result Value Ref Range   WBC 8.3 4.0 - 10.5 K/uL   RBC 5.04 4.22 - 5.81 MIL/uL   Hemoglobin 15.9 13.0 - 17.0 g/dL   HCT 44.9 39.0 - 52.0 %   MCV 89.1 78.0 - 100.0 fL   MCH 31.5 26.0 - 34.0 pg   MCHC 35.4 30.0 - 36.0 g/dL   RDW 12.4 11.5 - 15.5 %   Platelets 189 150 - 400 K/uL  Glucose, capillary     Status: Abnormal   Collection Time: 05/27/15  6:41 AM  Result Value Ref Range   Glucose-Capillary 172 (H) 65 - 99 mg/dL   Comment 1 Notify RN    Comment 2 Document in Chart    No results found.     Medical Problem List and Plan: 1. 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  2.  DVT Prophylaxis/Anticoagulation: Pharmaceutical: Lovenox 3. Chronic pain/Pain Management: Continue hydrocodone prn for OA/DDD.     4. Adjustment reaction/ Mood: Team to provide ego support.  LCSW to follow for evaluation and support. Continue Xanax bid.  5. Neuropsych: This patient is capable of making decisions on his  own behalf. 6. Skin/Wound Care: routine pressure relief measures 7. Fluids/Electrolytes/Nutrition: Monitor I/O. Check lytes in am. Push po fluids 8. Acute Renal insufficiency: likely due to poor intake and hypotension.  Continue to hold lisinopril and avoid hypotension. Will monitor labs for trends 9.  CAD/AAA:  Stable on ASA and zocor.  10. DM type 2: Will monitor BS ac/hs. Was on lantus 75 and glypizide at home.  Will increase lantus to 40 units and add meal coverage for now.  Titrate towards home dose as intake improves. Use SSI for elevated BS.  11. HTN:  Blood pressures are still on low side. Continue to hold lisinopril and avoid hypotension/hypoperfusion. Offer fluids between meals.    12. Insomnia: Will increase pm dose xanax to help with sleep.     Post Admission Physician Evaluation: 1. Functional deficits secondary  to Embolic ACA/PCA infarcts.  2. Patient is admitted to receive collaborative, interdisciplinary care between the physiatrist, rehab nursing staff, and therapy team. 3. Patient's level of medical complexity and substantial therapy needs in context of that medical necessity cannot be provided at a lesser intensity of care such as a SNF. 4. Patient has experienced substantial functional loss from his/her baseline which was documented above under the "Functional History" and "Functional Status" headings.  Judging by the patient's diagnosis, physical exam, and functional history, the patient has potential for functional progress which will result in measurable gains while on inpatient rehab.  These gains will be of substantial and practical use upon discharge  in facilitating mobility and self-care at the household level. 5. Physiatrist will provide 24 hour management of medical needs as well as oversight of the therapy plan/treatment and provide guidance as appropriate regarding the interaction of the two. 6. 24 hour rehab nursing will assist with bladder management, bowel  management, safety, skin/wound care, disease management, medication administration, pain management and patient education  and help integrate therapy concepts, techniques,education, etc. 7. PT will assess and treat for/with: Lower extremity strength, range of motion, stamina, balance, functional mobility, safety, adaptive techniques and equipment, NMR, visual-perceptual awareness, stroke education, family ed.   Goals are: supervision to mod I. 8. OT will assess and treat for/with: ADL's, functional mobility, safety, upper extremity strength, adaptive techniques and equipment, NMR, visual-perceptual awareness, stroke education, ego support.   Goals are: supervision to min assist. Therapy may proceed with showering this patient. 9. SLP will assess and treat for/with: speech, swallowing, communication, education.  Goals are: mod I to min assist. 10. Case Management and Social Worker will assess and treat for psychological issues and discharge planning. 11. Team conference will be held weekly to assess progress toward goals and to determine barriers to discharge. 12. Patient will receive at least 3 hours of therapy per day at least 5 days per week. 13. ELOS: 11-15 days       14. Prognosis:  excellent     Zachary T. Swartz, MD, FAAPMR [facility] 05/27/2015   05/27/2015 

## 2015-05-27 NOTE — Care Management Important Message (Signed)
Important Message  Patient Details  Name: Lucas Morgan MRN: [id number] Date of Birth: 11-Nov-1940   Medicare Important Message Given:  Yes-second notification given    Orson Aloe 05/27/2015, 8:44 AM

## 2015-05-27 NOTE — Discharge Summary (Signed)
PATIENT DETAILS Name: Lucas Morgan Age: 74 y.o. Sex: male Date of Birth: December 31, 1940 MRN: [id number]. Admitting Physician: Therisa Doyne, MD [id number], Shaune Pollack, PA-C  Admit Date: 05/24/2015 Discharge date: 05/27/2015  Recommendations for Outpatient Follow-up:  1. Please ensure follow-up with cardiology for workup of new onset systolic heart failure 2. Please ensure follow-up with electrophysiology for wound check  3. 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 Diabetes mellitus   . Dyslipidemia   . MI (myocardial infarction) 11/09/2008    2.5 x 23 Xience V DES to the CFX  . AAA (abdominal aortic aneurysm)     a. Abd U/S 7/14: mild aneurysmal dilatation 3x3 cm; cholelithiasis without evid of cholecystitis => repeat 1 year  . Coronary artery disease   . Arthritis     stenosis, lumbar region  . Renal insufficiency   . Stroke     DISCHARGE MEDICATIONS: Current Discharge Medication List    CONTINUE these medications which have CHANGED   Details  aspirin 325 MG tablet Take 1 tablet (325 mg total) by mouth daily. Qty: 30 tablet, Refills: 0    LANTUS SOLOSTAR 100 UNIT/ML Solostar Pen Inject 30 Units into the skin at bedtime.      CONTINUE these medications which have NOT CHANGED   Details  ALPRAZolam (XANAX) 0.5 MG tablet Take 0.5 mg by mouth 2 (two) times daily.  Refills: 0    glipiZIDE (GLUCOTROL) 10 MG tablet Take 10 mg by mouth daily before breakfast.     simvastatin (ZOCOR) 40 MG tablet Take 40 mg  by mouth at bedtime. Refills: 6      STOP taking these medications     HYDROcodone-acetaminophen (NORCO) 10-325 MG per tablet      lisinopril (PRINIVIL,ZESTRIL) 2.5 MG tablet      metroNIDAZOLE (FLAGYL) 500 MG tablet      ondansetron (ZOFRAN) 4 MG tablet         ALLERGIES:   Allergies  Allergen Reactions  . Trazodone And Nefazodone Other (See Comments)    High blood sugar    BRIEF HPI:  See H&P, Labs, Consult and Test reports for all details in brief, patient is a 74 year old male with past medical history of diabetes, hypertension who was recently just discharged from  [facility] hospital after suffering a CVA, admitted with worsening right-sided weakness and aphasia.  CONSULTATIONS:   cardiology, neurology and rehabilitation medicine  PERTINENT RADIOLOGIC STUDIES: Dg Chest 2 View  05/25/2015   CLINICAL DATA:  Cough.  Recent stroke.  EXAM: CHEST  2 VIEW  COMPARISON:  10/18/2014  FINDINGS: The patient is mildly rotated to the right which partially limits evaluation of the mediastinum. Cardiomediastinal silhouette is grossly unchanged, with cardiac silhouette upper limits of normal in size. The patient has taken a slightly shallower inspiration than on the prior study. Chronic interstitial coarsening is again seen and may reflect underlying COPD/chronic bronchitis. There is slightly increased heterogeneous opacity in the right lung base which projects posteriorly on the lateral image.  No pleural effusion or pneumothorax is identified. Degenerative changes are noted at the [facility] joints.  IMPRESSION: COPD with mild right basilar infiltrate versus atelectasis.   Electronically Signed   By: Sebastian Ache M.D.   On: 05/25/2015 12:17   Ct Head Wo Contrast  05/24/2015   CLINICAL DATA:  Code stroke. RIGHT-sided weakness. Symptoms began 4 days ago.  EXAM: CT HEAD WITHOUT CONTRAST  TECHNIQUE: Contiguous axial images were obtained from the base of the skull through the vertex without intravenous  contrast.  COMPARISON:  CT head 06/10/2014.  MR brain 06/10/2014.  FINDINGS: Well-defined area of cytotoxic edema affects the LEFT superior frontal cortex and subcortical white matter consistent with an ischemic event onset 4 days ago. No hemorrhagic transformation. No other areas of acute infarction are suspected.  No CT findings to suggest proximal vascular thrombosis.  Mild atrophy. Chronic LEFT frontal and parietal subcortical infarction related to previous LEFT MCA territory ischemic insult documented on September 2015 MR. An additional LEFT parietal insult predated that stroke.  Hypoattenuation of the periventricular and subcortical regions consistent with small vessel disease.  No mass lesion, hydrocephalus, or extra-axial fluid.  Calvarium intact.  No sinus or mastoid air fluid level.  IMPRESSION: Well-defined area of cytotoxic edema affecting the LEFT superior frontal cortex and subcortical white matter consistent with a late acute cerebral infarction. No features to suggest proximal vascular occlusion or hemorrhagic transformation.  Chronic changes as described.  Critical Value/emergent results were called by telephone at the time of interpretation on 05/24/2015 at 6:00 pm to Dr. Nelva Nay , who verbally acknowledged these results.   Electronically Signed   By: Elsie Stain M.D.   On: 05/24/2015 18:01   Mr Brain Wo Contrast  05/25/2015   CLINICAL DATA:  Recent left-sided infarct with right-sided weakness and aphasia. The weakness has improved but the aphasia continues. The patient was discharged from an outside hospital 2 days ago.  EXAM: MRI HEAD WITHOUT CONTRAST  MRA HEAD WITHOUT CONTRAST  TECHNIQUE: Multiplanar, multiecho pulse sequences of the brain and surrounding structures were obtained without intravenous contrast. Angiographic images of the head were obtained using MRA technique without contrast.  COMPARISON:  None.  CT head without contrast 05/24/2015.  FINDINGS: MRI HEAD FINDINGS  The  acute/subacute cortical and subcortical infarct in the high left frontal lobe is confirmed. This involves the [employer]. Additional scattered foci are present in the frontal lobe and parietal lobe on the left and what appears to be watershed distribution. There is additional involvement of the more superior and medial primary motor cortex. Portions of the inferior parietal and left occipital lobe are noted as well.  Additionally, at least 4 punctate areas of restricted diffusion are present in the right parietal and medial occipital lobe. There is no hemorrhage associated with these lesions. T2 changes are evident within the areas of acute/subacute infarct.  Moderate periventricular and subcortical T2 changes are additionally present bilaterally. Brainstem is unremarkable. Flow is present in the major intracranial arteries.  The study is moderately degraded by patient motion and could not be completed.  The left globe is collapsed with increased signal. The right globe is intact. The orbits are otherwise unremarkable. The paranasal sinuses and mastoid air cells are clear. Midline structures are unremarkable.  MRA HEAD FINDINGS  The internal carotid arteries are within normal limits from the high cervical segments through the ICA termini bilaterally. Moderate narrowing is present in the mid right A1 segment. More mild narrowing is present  in the mid left A1 segment. The M1 segments are intact. The left ACA is duplicated. The anterior communicating artery appears to be patent. The MCA bifurcations are intact bilaterally. There is moderate attenuation of MCA branch vessels distally, right greater than left.  The right vertebral artery terminates at the PICA. The left vertebral artery is within normal limits. The PICA origin is visualized and normal. The basilar artery is within normal limits. Both posterior cerebral arteries originate from the basilar tip. There is some attenuation of distal PCA branch  vessels.  IMPRESSION: 1. Acute/subacute nonhemorrhagic infarct involving the left frontal lobe to level of the precentral gyrus. 2. Additional smaller non confluent punctate areas of infarction within the more anterior left frontal lobe, the left parietal lobe, and left occipital lobe seen to follow a watershed distribution. This suggests more proximal disease, potentially in the neck, or and episode of hypotension. 3. At least 4 punctate foci of acute nonhemorrhagic infarct are noted within the right parietal and occipital lobe. Given the bilateral distribution, a central embolic source also needs to be considered. 4. Age advanced atrophy and diffuse white matter disease. 5. Moderate right and mild left mid A1 segment stenoses. 6. Moderate distal small vessel disease, most evident within the right MCA branch vessels.   Electronically Signed   By: Marin Roberts M.D.   On: 05/25/2015 10:41   Mr Lumbar Spine Wo Contrast  05/02/2015   CLINICAL DATA:  Chronic worsening low back pain with bilateral leg weakness and numbness.  EXAM: MRI LUMBAR SPINE WITHOUT CONTRAST  TECHNIQUE: Multiplanar, multisequence MR imaging of the lumbar spine was performed. No intravenous contrast was administered.  COMPARISON:  02/12/2013  FINDINGS: There are chronic L1 and L4 vertebral body compression fractures with remote vertebral body augmentation with methylmethacrylate within the vertebral bodies. There is 9 mm of retropulsion of the superior posterior margin of the L1 vertebral body. There is approximately 70% height loss of the L1 vertebral body. There is approximately 50% height loss of the L4 vertebral body.  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  The vertebral bodies of the lumbar spine are normal in alignment. There is normal bone marrow signal demonstrated throughout the vertebra. The intervertebral disc spaces are well-maintained.  The  spinal cord is normal in signal and contour. The cord terminates normally at L1 . The nerve roots of the cauda equina and the filum terminale are normal.  The visualized portions of the SI joints are unremarkable.  There is a mild infrarenal abdominal aortic aneurysm measuring 3 cm in AP diameter.  T12-L1: Mild eccentric left broad-based disc bulge. Retropulsion of the superior posterior margin of the L1 vertebral body flattens the ventral thecal sac and results in mild spinal stenosis. No evidence of neural foraminal stenosis.  L1-L2: Mild broad-based disc bulge. No evidence of neural foraminal stenosis. No central canal stenosis.  L2-L3: Mild broad-based disc bulge. No evidence of neural foraminal stenosis. No central canal stenosis.  L3-L4: Prior laminectomy. Mild broad-based disc bulge. Moderate right and mild left facet arthropathy. Moderate right foraminal stenosis. No left foraminal stenosis. No central canal stenosis.  L4-L5: Mild broad-based disc bulge. Moderate bilateral facet arthropathy with ligamentum flavum infolding resulting in moderate spinal stenosis and left lateral recess stenosis. Severe left foraminal stenosis. Mild right foraminal stenosis.  L5-S1: No significant disc bulge. No evidence of neural foraminal stenosis. No central canal stenosis. Moderate bilateral facet  arthropathy.  IMPRESSION: 1. At T12-L1 there is a mild eccentric left broad-based disc bulge. Retropulsion of the superior posterior margin of the L1 vertebral body flattens the ventral thecal sac and results in mild spinal stenosis. 2. At L3-4 there is a mild broad-based disc bulge. Prior laminectomy. Moderate right and mild left facet arthropathy. Moderate right foraminal stenosis. 3. At L4-5 there is a mild broad-based disc bulge. Moderate bilateral facet arthropathy with ligamentum flavum infolding resulting in moderate spinal stenosis and left lateral recess stenosis. Severe left foraminal stenosis. Mild right foraminal  stenosis. 4. 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 5. 3 cm infrarenal abdominal aortic aneurysm.   Electronically Signed   By: Elige Ko   On: 05/02/2015 12:07   Mr Maxine Glenn Head/brain Wo Cm  05/25/2015   CLINICAL DATA:  Recent left-sided infarct with right-sided weakness and aphasia. The weakness has improved but the aphasia continues. The patient was discharged from an outside hospital 2 days ago.  EXAM: MRI HEAD WITHOUT CONTRAST  MRA HEAD WITHOUT CONTRAST  TECHNIQUE: Multiplanar, multiecho pulse sequences of the brain and surrounding structures were obtained without intravenous contrast. Angiographic images of the head were obtained using MRA technique without contrast.  COMPARISON:  None.  CT head without contrast 05/24/2015.  FINDINGS: MRI HEAD FINDINGS  The acute/subacute cortical and subcortical infarct in the high left frontal lobe is confirmed. This involves the [employer]. Additional scattered foci are present in the frontal lobe and parietal lobe on the left and what appears to be watershed distribution. There is additional involvement of the more superior and medial primary motor cortex. Portions of the inferior parietal and left occipital lobe are noted as well.  Additionally, at least 4 punctate areas of restricted diffusion are present in the right parietal and medial occipital lobe. There is no hemorrhage associated with these lesions. T2 changes are evident within the areas of acute/subacute infarct.  Moderate periventricular and subcortical T2 changes are additionally present bilaterally. Brainstem is unremarkable. Flow is present in the major intracranial arteries.  The study is moderately degraded by patient motion and could not be completed.  The left globe is collapsed with increased signal. The right globe is intact. The orbits are otherwise unremarkable. The paranasal sinuses and mastoid air cells are clear. Midline structures are unremarkable.  MRA HEAD  FINDINGS  The internal carotid arteries are within normal limits from the high cervical segments through the ICA termini bilaterally. Moderate narrowing is present in the mid right A1 segment. More mild narrowing is present in the mid left A1 segment. The M1 segments are intact. The left ACA is duplicated. The anterior communicating artery appears to be patent. The MCA bifurcations are intact bilaterally. There is moderate attenuation of MCA branch vessels distally, right greater than left.  The right vertebral artery terminates at the PICA. The left vertebral artery is within normal limits. The PICA origin is visualized and normal. The basilar artery is within normal limits. Both posterior cerebral arteries originate from the basilar tip. There is some attenuation of distal PCA branch vessels.  IMPRESSION: 1. Acute/subacute nonhemorrhagic infarct involving the left frontal lobe to level of the precentral gyrus. 2. Additional smaller non confluent punctate areas of infarction within the more anterior left frontal lobe, the left parietal lobe, and left occipital lobe seen to follow a watershed distribution. This suggests more proximal disease, potentially in the neck, or and episode of hypotension. 3. At least 4 punctate foci of acute nonhemorrhagic  infarct are noted within the right parietal and occipital lobe. Given the bilateral distribution, a central embolic source also needs to be considered. 4. Age advanced atrophy and diffuse white matter disease. 5. Moderate right and mild left mid A1 segment stenoses. 6. Moderate distal small vessel disease, most evident within the right MCA branch vessels.   Electronically Signed   By: Marin Roberts M.D.   On: 05/25/2015 10:41     PERTINENT LAB RESULTS: CBC:  Recent Labs  05/26/15 1347 05/27/15 0637  WBC 11.6* 8.3  HGB 16.3 15.9  HCT 45.7 44.9  PLT 201 189   CMET CMP     Component Value Date/Time   NA 134* 05/27/2015 0637   K 4.5 05/27/2015 0637    CL 100* 05/27/2015 0637   CO2 26 05/27/2015 0637   GLUCOSE 171* 05/27/2015 0637   BUN 19 05/27/2015 0637   CREATININE 1.31* 05/27/2015 0637   CALCIUM 9.1 05/27/2015 0637   PROT 6.6 05/24/2015 1740   ALBUMIN 3.3* 05/24/2015 1740   AST 17 05/24/2015 1740   ALT 14* 05/24/2015 1740   ALKPHOS 52 05/24/2015 1740   BILITOT 0.7 05/24/2015 1740   GFRNONAA 52* 05/27/2015 0637   GFRAA >60 05/27/2015 0637    GFR Estimated Creatinine Clearance: 50.2 mL/min (by C-G formula based on Cr of 1.31). No results for input(s): LIPASE, AMYLASE in the last 72 hours. No results for input(s): CKTOTAL, CKMB, CKMBINDEX, TROPONINI in the last 72 hours. Invalid input(s): POCBNP No results for input(s): DDIMER in the last 72 hours.  Recent Labs  05/25/15 0705  HGBA1C 7.5*    Recent Labs  05/25/15 0705  CHOL 119  HDL 33*  LDLCALC 60  TRIG 725  CHOLHDL 3.6   No results for input(s): TSH, T4TOTAL, T3FREE, THYROIDAB in the last 72 hours.  Invalid input(s): FREET3 No results for input(s): VITAMINB12, FOLATE, FERRITIN, TIBC, IRON, RETICCTPCT in the last 72 hours. Coags:  Recent Labs  05/24/15 1740  INR 1.09   Microbiology: No results found for this or any previous visit (from the past 240 hour(s)).   BRIEF HOSPITAL COURSE:   Acute CVA: Likely extension of most recent CVA. 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 Improved RUE strength. Spoke with neurology, recommendations are to continue full dose aspirin and statin. TEE  negative for embolic source but EF around 36% (see below).LDL 60 (goal<70)-continue statin. Loop recorder implanted, would require close follow-up with cardiology and neurology. Stable to transfer to CIR.  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 Creatinine improved following hydration-and back to usual baseline.   Chronic systolic heart failure: EF on TEE around 30-35%, clinically compensated. Unfortunately, blood pressure  soft to start any antihypertensives-furthermore-with recent suspected extension of the stroke in setting of dehydration/relative hypotension-suspect would best benefit from allowing permissive hypertension to further stability. Patient is completely compensated without any evidence of volume overload. Spoke with Dr. Hilty-cardiology on-call-who agreed with this plan. For now defer any cardiac meds like beta blocker/lisinopril till patient more stable. Further workup deferred to the outpatient setting-please ensure patient has follow-up with cardiology  Type 2 diabetes: CBGs moderately well controlled-continue Lantus 30 units, add SSI  Dyslipidemia: Continue statin-see above  Essential hypertension: Allow permissive hypertension-especially with extension of acute CVA (see above). Continue to Hold all anti-hypertensives  Leukocytosis: Resolved, afebrile. Chest x-ray suggestive of right basilar infiltrate vs atelectasis-monitor off Abx.   CAD, NATIVE VESSEL:prior inferior MI 10/2008 with DES to LCX,currently stable,  continue aspirin and statin  Chronic back pain: continue as needed narcotics  Abdominal aortic aneurysm: Further monitoring in the outpatient setting  TODAY-DAY OF DISCHARGE:  Subjective:   Fredrik Rigger today has no major complaints. Speech still very poor-able to speak only 1 or 2 words.  Objective:   Blood pressure 103/62, pulse 81, temperature 98 F (36.7 C), temperature source Oral, resp. rate 20, weight 76.522 kg (168 lb 11.2 oz), SpO2 98 %.  Intake/Output Summary (Last 24 hours) at 05/27/15 1329 Last data filed at 05/27/15 1317  Gross per 24 hour  Intake    480 ml  Output      0 ml  Net    480 ml   Filed Weights   05/24/15 2138  Weight: 76.522 kg (168 lb 11.2 oz)    Exam Awake Alert, Oriented *3, No new F.N deficits, Normal affect [place].AT,PERRAL Supple Neck,No JVD, No cervical lymphadenopathy appriciated.  Symmetrical Chest wall movement, Good air movement  bilaterally, CTAB RRR,No Gallops,Rubs or new Murmurs, No Parasternal Heave +ve B.Sounds, Abd Soft, Non tender, No organomegaly appriciated, No rebound -guarding or rigidity. No Cyanosis, Clubbing or edema, No new Rash or bruise  DISCHARGE CONDITION: Stable  DISPOSITION: CIR  DISCHARGE INSTRUCTIONS:    Activity:  As tolerated with Full fall precautions use walker/cane & assistance as needed  Get Medicines reviewed and adjusted: Please take all your medications with you for your next visit with your Primary MD  Please request your Primary MD to go over all hospital tests and procedure/radiological results at the follow up, please ask your Primary MD to get all Hospital records sent to his/her office.  If you experience worsening of your admission symptoms, develop shortness of breath, life threatening emergency, suicidal or homicidal thoughts you must seek medical attention immediately by calling 911 or calling your MD immediately  if symptoms less severe.  You must read complete instructions/literature along with all the possible adverse reactions/side effects for all the Medicines you take and that have been prescribed to you. Take any new Medicines after you have completely understood and accpet all the possible adverse reactions/side effects.   Do not drive when taking Pain medications.   Do not take more than prescribed Pain, Sleep and Anxiety Medications  Special Instructions: If you have smoked or chewed Tobacco  in the last 2 yrs please stop smoking, stop any regular Alcohol  and or any Recreational drug use.  Wear Seat belts while driving.  Please note  You were cared for by a hospitalist during your hospital stay. Once you are discharged, your primary care physician will handle any further medical issues. Please note that NO REFILLS for any discharge medications will be authorized once you are discharged, as it is imperative that you return to your primary care physician (or  establish a relationship with a primary care physician if you do not have one) for your aftercare needs so that they can reassess your need for medications and monitor your lab values.   Diet recommendation: Diabetic Diet Heart Healthy diet  Discharge Instructions    Diet - low sodium heart healthy    Complete by:  As directed      Diet Carb Modified    Complete by:  As directed      Increase activity slowly    Complete by:  As directed            Follow-up Information    Follow up with [facility]  On 06/05/2015.   Why:  See device clinic at noon for a wound check. Please arrive 15 minutes early for paperwork.   Contact information:   [address] [place] [address]       Follow up with BAUCOM, JENNY B, PA-C. Schedule an appointment as soon as possible for a visit in 2 weeks.   Specialty:  Physician Assistant   Why:  after discharge from [facility]   Contact information:   [address] [place] [address] [phone]       Follow up with [facility]. Schedule an appointment as soon as possible for a visit in 1 month.   Why:  FOR EVALUATIO OF CHF   Contact information:   [address] [place] [address] [phone]      Total Time spent on discharge equals 45 minutes.  SignedJeoffrey Massed 05/27/2015 1:29 PM

## 2015-05-27 NOTE — H&P (Signed)
Physical Medicine and Rehabilitation Admission H&P    Chief Complaint  Patient presents with  . Right sided weakness and difficulty speaking   HPI:   Lucas Morgan is a 74 y.o. 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 He was readmitted on Jun 03, 2015 with pronounced right sided weakness and garbled speech. He was noted ot be dehydrated with BUN/Cr- 37/1.87 and was started on IVF for hydration. CT head revealed well defined area of cytotoxic edema left superior frontal cortex and subcortical white matter consistent with late acute cerebral infarction.  MRI/MRA brain done revealing acute/subacute hon-hemorrhagic infarct left frontal and additional areas left parietal and left occipital (question hypoperfusion), right parietal and right occipital lobes with question of embolic source. Age advanced atrophy with moderate distal SVD most evident in R-MCA branches. Carotid dopplers with 1- 39% ICA stenosis bilaterally. Neurology consulted felt that patient had recurrent symptoms due to dehydration and episode of hypotension and recommended work up for embolic source. TEE done revealing EF 30-35%, no PFO or ASD and loop recorder placed by Dr Cathie Olden.  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 Patient with resultant right sided weakness, right inattention, aphasia with expressive > receptive deficits. Therapy ongoing and CIR was recommended by MD and Rehab team.     Review of Systems  Eyes: Negative for blurred vision, double vision and pain.  Respiratory: Negative for cough, sputum production and shortness of breath.   Cardiovascular: Negative for chest pain, palpitations and leg swelling.  Gastrointestinal: Negative for heartburn, vomiting and abdominal pain.  Genitourinary: Negative for dysuria.    Musculoskeletal: Positive for myalgias and back pain (chronic low back pain).  Neurological: Positive for sensory change, speech change, focal weakness and weakness. Negative for headaches.  Psychiatric/Behavioral: Positive for depression. The patient is nervous/anxious and has insomnia (due to anxiety).       Past Medical History  Diagnosis Date  . Diabetes mellitus   . Dyslipidemia   . MI (myocardial infarction) 11/09/2008    2.5 x 23 Xience V DES to the CFX  . AAA (abdominal aortic aneurysm)     a. Abd U/S 7/14: mild aneurysmal dilatation 3x3 cm; cholelithiasis without evid of cholecystitis => repeat 1 year  . Coronary artery disease   . Arthritis     stenosis, lumbar region  . Renal insufficiency   . Stroke     Past Surgical History  Procedure Laterality Date  . Knee surgery    . Coronary stent placement    . Back surgery  2015    lumbar fusion  . Joint replacement Left   . 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 Lumbar laminectomy/decompression microdiscectomy N/A 10/18/2014    Procedure: LUMBAR LAMINECTOMY/DECOMPRESSION MICRODISCECTOMYLUMBAR THREE-FOUR ;  Surgeon: Charlie Pitter, MD;  Location: [facility];  Service: Neurosurgery;  Laterality: N/A;  . Coronary angioplasty    . Ep implantable device N/A 05/26/2015    Procedure: Loop Recorder Insertion;  Surgeon: Evans Lance, MD;  Location: [place] [facility];  Service: Cardiovascular;  Laterality: N/A;    Family History  Problem Relation Age of Onset  . Heart attack Brother     x2 brothers  . CAD Brother     Social History:  reports that he quit smoking about 21 years ago. He has never used smokeless tobacco. He reports that he does not drink alcohol or use illicit drugs.     Allergies  Allergen Reactions  . Trazodone And Nefazodone Other (See Comments)    High blood sugar     Medications Prior to Admission  Medication Sig Dispense Refill  . ALPRAZolam (XANAX) 0.5 MG tablet  Take 0.5 mg by mouth 2 (two) times daily.   0  . aspirin 81 MG tablet Take 81 mg by mouth daily.      Marland Kitchen glipiZIDE (GLUCOTROL) 10 MG tablet Take 10 mg by mouth daily before breakfast.     . HYDROcodone-acetaminophen (NORCO) 10-325 MG per tablet Take 1 tablet by mouth every 6 (six) hours as needed (pain). 60 tablet 0  . lisinopril (PRINIVIL,ZESTRIL) 2.5 MG tablet Take 2.5 mg by mouth daily.    . metroNIDAZOLE (FLAGYL) 500 MG tablet Take 500 mg by mouth 3 (three) times daily.     . simvastatin (ZOCOR) 40 MG tablet Take 40 mg by mouth at bedtime.  6  . [DISCONTINUED] LANTUS SOLOSTAR 100 UNIT/ML Solostar Pen Inject 75 Units into the skin at bedtime.   0  . ondansetron (ZOFRAN) 4 MG tablet Take 1 tablet (4 mg total) by mouth every 6 (six) hours. (Patient not taking: Reported on 04/08/2015) 12 tablet 0    Home: Home Living Family/patient expects to be discharged to:: Inpatient rehab Living Arrangements: Children (daughter) Available Help at Discharge: Family, Available 24 hours/day (initially) Type of Home: House Home Access: Level entry Home Layout: One level Home Equipment: Cane - single point, Industrial/product designer History: Prior Function Level of Independence: Independent Comments: daughter reports pt was independent with yard work, cooking, Education administrator, Chief of Staff Status:  Mobility: Bed Mobility Overal bed mobility: Needs Assistance Bed Mobility: Sit to Supine Supine to sit: Min guard Sit to supine: Min guard General bed mobility comments: sitting up in chair Transfers Overall transfer level: Needs assistance Equipment used: 1 person hand held assist Transfers: Sit to/from Stand Sit to Stand: Min assist Stand pivot transfers: Mod assist, Min assist General transfer comment: pt requires min/modA when turning due to LOB Ambulation/Gait Ambulation/Gait assistance: Min assist, Mod assist Ambulation Distance (Feet): 150 Feet Assistive device: 1 person hand held assist Gait  Pattern/deviations: Step-through pattern General Gait Details: pt with decreased R LE foot clearance with staggering to the R, worsening with increase distance due to onset of fatigue. Pt with scissored gait pattern occasionally to correct balance. Gait velocity: Decreased Gait velocity interpretation: Below normal speed for age/gender Stairs: Yes Stairs assistance: Min assist Stair Management: Two rails Number of Stairs: 10 General stair comments: requires 2 rails and minA to maintain balance due to difficulty sequencing    ADL: ADL Overall ADL's : Needs assistance/impaired Eating/Feeding: Moderate assistance, Bed level, Sitting Grooming: Wash/dry hands, Wash/dry face, Brushing hair, Oral care, Moderate assistance, Standing Grooming Details (indicate cue type and reason): Pt requires assist for thoroughness.  Assist to locate items and min A for standing balance  Upper Body Bathing: Moderate assistance, Sitting Lower Body Bathing: Moderate assistance, Sit to/from stand Upper Body Dressing : Moderate assistance, Sitting Lower Body Dressing: Moderate assistance, Sit to/from stand Lower Body Dressing Details (indicate cue type and reason): Pt able to doff socks with supervision while seated.   Requires mod A to don socks.   Toilet Transfer: Minimal assistance, Moderate assistance, Ambulation, Comfort height toilet Toilet Transfer  Details (indicate cue type and reason): Pt ambulated into BR with min A, but lost balance as the turned toward rt requiring mod A to recover  Toileting- Clothing Manipulation and Hygiene: Moderate assistance, Sit to/from stand Functional mobility during ADLs: Minimal assistance, Moderate assistance General ADL Comments: Pt demonstrates Rt inattention requiring mod cues to attend fully to Rt UE (intermittent), and cues to locate items on Rt.   Cognition: Cognition Overall Cognitive Status: Difficult to assess Orientation Level: Oriented to person, Oriented to  place Cognition Arousal/Alertness: Awake/alert Behavior During Therapy: Impulsive Overall Cognitive Status: Difficult to assess Area of Impairment: Problem solving, Safety/judgement, Attention Current Attention Level: Selective Following Commands: Follows one step commands with increased time, Follows multi-step commands inconsistently Safety/Judgement: Decreased awareness of safety Problem Solving: Requires verbal cues Difficult to assess due to: Impaired communication     Blood pressure 103/62, pulse 81, temperature 98 F (36.7 C), temperature source Oral, resp. rate 20, weight 76.522 kg (168 lb 11.2 oz), SpO2 98 %. Physical Exam  Nursing note and vitals reviewed. Constitutional: He is oriented to person, place, and time. He appears well-developed and well-nourished.  HENT:  Head: Normocephalic and atraumatic.  Eyes: Conjunctivae are normal. Pupils are equal, round, and reactive to light.  Neck: Normal range of motion. Neck supple. No JVD present. No tracheal deviation present. No thyromegaly present.  Cardiovascular: Normal rate and regular rhythm.  Exam reveals no gallop and no friction rub.   No murmur heard. Respiratory: Effort normal and breath sounds normal. No respiratory distress. He has no wheezes. He has no rales. He exhibits no tenderness.  GI: Soft. Bowel sounds are normal. He exhibits no distension. There is no tenderness.  Musculoskeletal: He exhibits no edema or tenderness.  Neurological: He is alert and oriented to person, place, and time.  Right facial weakness and tongue deviation with severe dysarthria. Apparent right hemianopsia. Able to answer basic Y/N questions.  Able to follow simple one and two step commands.  Right sided weakness with sensory deficits UE> LE. RUE: 2 to 2+/5 deltoid, bicep, tricep, wrist, HI.  RLE: 3+ to 4- hf, 4/5 KE and ADF/APF. LUE and LLE grossly 4+/5 prox to distal. Senses pain grossly in RUE, more sensitive to pain in RLE. DTR's 1+    Skin: Skin is warm and dry. No erythema.  Psychiatric: His mood appears anxious. His affect is labile. His speech is slurred. He is slowed.    Results for orders placed or performed during the hospital encounter of 05/24/15 (from the past 48 hour(s))  Glucose, capillary     Status: Abnormal   Collection Time: 05/25/15  4:33 PM  Result Value Ref Range   Glucose-Capillary 212 (H) 65 - 99 mg/dL  Glucose, capillary     Status: Abnormal   Collection Time: 05/25/15  9:52 PM  Result Value Ref Range   Glucose-Capillary 261 (H) 65 - 99 mg/dL   Comment 1 Notify RN    Comment 2 Document in Chart   Glucose, capillary     Status: Abnormal   Collection Time: 05/26/15  6:30 AM  Result Value Ref Range   Glucose-Capillary 123 (H) 65 - 99 mg/dL   Comment 1 Notify RN    Comment 2 Document in Chart   Glucose, capillary     Status: Abnormal   Collection Time: 05/26/15 12:28 PM  Result Value Ref Range   Glucose-Capillary 107 (H) 65 - 99 mg/dL  CBC     Status: Abnormal   Collection  Time: 05/26/15  1:47 PM  Result Value Ref Range   WBC 11.6 (H) 4.0 - 10.5 K/uL   RBC 5.09 4.22 - 5.81 MIL/uL   Hemoglobin 16.3 13.0 - 17.0 g/dL   HCT 45.7 39.0 - 52.0 %   MCV 89.8 78.0 - 100.0 fL   MCH 32.0 26.0 - 34.0 pg   MCHC 35.7 30.0 - 36.0 g/dL   RDW 12.3 11.5 - 15.5 %   Platelets 201 150 - 400 K/uL  Creatinine, serum     Status: Abnormal   Collection Time: 05/26/15  1:47 PM  Result Value Ref Range   Creatinine, Ser 1.39 (H) 0.61 - 1.24 mg/dL   GFR calc non Af Amer 49 (L) >60 mL/min   GFR calc Af Amer 56 (L) >60 mL/min    Comment: (NOTE) The eGFR has been calculated using the CKD EPI equation. This calculation has not been validated in all clinical situations. eGFR's persistently <60 mL/min signify possible Chronic Kidney Disease.   Glucose, capillary     Status: Abnormal   Collection Time: 05/26/15  4:58 PM  Result Value Ref Range   Glucose-Capillary 242 (H) 65 - 99 mg/dL   Comment 1 Notify RN     Comment 2 Document in Chart   Glucose, capillary     Status: Abnormal   Collection Time: 05/26/15  9:40 PM  Result Value Ref Range   Glucose-Capillary 243 (H) 65 - 99 mg/dL   Comment 1 Notify RN    Comment 2 Document in Chart   Basic metabolic panel     Status: Abnormal   Collection Time: 05/27/15  6:37 AM  Result Value Ref Range   Sodium 134 (L) 135 - 145 mmol/L   Potassium 4.5 3.5 - 5.1 mmol/L   Chloride 100 (L) 101 - 111 mmol/L   CO2 26 22 - 32 mmol/L   Glucose, Bld 171 (H) 65 - 99 mg/dL   BUN 19 6 - 20 mg/dL   Creatinine, Ser 1.31 (H) 0.61 - 1.24 mg/dL   Calcium 9.1 8.9 - 10.3 mg/dL   GFR calc non Af Amer 52 (L) >60 mL/min   GFR calc Af Amer >60 >60 mL/min    Comment: (NOTE) The eGFR has been calculated using the CKD EPI equation. This calculation has not been validated in all clinical situations. eGFR's persistently <60 mL/min signify possible Chronic Kidney Disease.    Anion gap 8 5 - 15  CBC     Status: None   Collection Time: 05/27/15  6:37 AM  Result Value Ref Range   WBC 8.3 4.0 - 10.5 K/uL   RBC 5.04 4.22 - 5.81 MIL/uL   Hemoglobin 15.9 13.0 - 17.0 g/dL   HCT 44.9 39.0 - 52.0 %   MCV 89.1 78.0 - 100.0 fL   MCH 31.5 26.0 - 34.0 pg   MCHC 35.4 30.0 - 36.0 g/dL   RDW 12.4 11.5 - 15.5 %   Platelets 189 150 - 400 K/uL  Glucose, capillary     Status: Abnormal   Collection Time: 05/27/15  6:41 AM  Result Value Ref Range   Glucose-Capillary 172 (H) 65 - 99 mg/dL   Comment 1 Notify RN    Comment 2 Document in Chart    No results found.     Medical Problem List and Plan: 1. 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  2.  DVT Prophylaxis/Anticoagulation: Pharmaceutical: Lovenox 3. Chronic pain/Pain Management: Continue hydrocodone prn for OA/DDD.  4. Adjustment reaction/ Mood: Team to provide ego support.  LCSW to follow for evaluation and support. Continue Xanax bid.  5. Neuropsych: This patient is capable of making decisions on his  own behalf. 6. Skin/Wound Care: routine pressure relief measures 7. Fluids/Electrolytes/Nutrition: Monitor I/O. Check lytes in am. Push po fluids 8. Acute Renal insufficiency: likely due to poor intake and hypotension.  Continue to hold lisinopril and avoid hypotension. Will monitor labs for trends 9.  CAD/AAA:  Stable on ASA and zocor.  10. DM type 2: Will monitor BS ac/hs. Was on lantus 75 and glypizide at home.  Will increase lantus to 40 units and add meal coverage for now.  Titrate towards home dose as intake improves. Use SSI for elevated BS.  11. HTN:  Blood pressures are still on low side. Continue to hold lisinopril and avoid hypotension/hypoperfusion. Offer fluids between meals.    12. Insomnia: Will increase pm dose xanax to help with sleep.     Post Admission Physician Evaluation: 1. Functional deficits secondary  to Embolic ACA/PCA infarcts.  2. Patient is admitted to receive collaborative, interdisciplinary care between the physiatrist, rehab nursing staff, and therapy team. 3. Patient's level of medical complexity and substantial therapy needs in context of that medical necessity cannot be provided at a lesser intensity of care such as a SNF. 4. Patient has experienced substantial functional loss from his/her baseline which was documented above under the "Functional History" and "Functional Status" headings.  Judging by the patient's diagnosis, physical exam, and functional history, the patient has potential for functional progress which will result in measurable gains while on inpatient rehab.  These gains will be of substantial and practical use upon discharge  in facilitating mobility and self-care at the household level. 5. Physiatrist will provide 24 hour management of medical needs as well as oversight of the therapy plan/treatment and provide guidance as appropriate regarding the interaction of the two. 6. 24 hour rehab nursing will assist with bladder management, bowel  management, safety, skin/wound care, disease management, medication administration, pain management and patient education  and help integrate therapy concepts, techniques,education, etc. 7. PT will assess and treat for/with: Lower extremity strength, range of motion, stamina, balance, functional mobility, safety, adaptive techniques and equipment, NMR, visual-perceptual awareness, stroke education, family ed.   Goals are: supervision to mod I. 8. OT will assess and treat for/with: ADL's, functional mobility, safety, upper extremity strength, adaptive techniques and equipment, NMR, visual-perceptual awareness, stroke education, ego support.   Goals are: supervision to min assist. Therapy may proceed with showering this patient. 9. SLP will assess and treat for/with: speech, swallowing, communication, education.  Goals are: mod I to min assist. 10. Case Management and Social Worker will assess and treat for psychological issues and discharge planning. 11. Team conference will be held weekly to assess progress toward goals and to determine barriers to discharge. 12. Patient will receive at least 3 hours of therapy per day at least 5 days per week. 13. ELOS: 11-15 days       14. Prognosis:  excellent     Meredith Staggers, MD, [facility] 05/27/2015   05/27/2015

## 2015-05-27 NOTE — Progress Notes (Signed)
PMR Admission Coordinator Pre-Admission Assessment  Patient: Lucas Morgan is an 74 y.o., male MRN: [id number] DOB: 09-19-1940 Height:   Weight: 76.522 kg (168 lb 11.2 oz)  Insurance Information HMO: yes PPO: PCP: IPA: 80/20: OTHER: Medicare replacement policy PRIMARY: A[employer] Medicare Policy#: [id number] Subscriber: pt CM Name: Gweneth Dimitri Phone#: [phone] Fax#: UHC online portal Pre-Cert#: [id number] Employer: retired approved for 7 days. F/U CM Amy Coltrane phone [phone] EMR access Benefits: Phone #: [phone] Name: 05/27/15 Eff. Date: 04/14/15 Deduct: none Out of Pocket Max: [phone] Life Max: none CIR: $430 copay per day days 1-4 then covers 100% SNF: no copay days 1-20; $160 copay per day days 21-62; no copay days 63-100 Outpatient: $40 copay per visit Co-Pay: no visit limit Home Health: 100% Co-Pay: no visit limit DME: 80% Co-Pay: 20% Providers: in network  SECONDARY: none  Medicaid Application Date: Case Manager:  Disability Application Date: Case Worker:  Daughter to check into medicaid eligibility in [place]  Emergency Contact Information Contact Information    Name Relation Home Work Mobile   [place] Daughter [phone]  [phone]   Morgan,Lucas Daughter [phone]  [phone]   Morgan, Lucas  [phone]       Current Medical History  Patient Admitting Diagnosis: embolic cerebral infarcts  History of Present Illness: Lucas Morgan is a 74 y.o. 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 He was readmitted on Jun 09, 2015  with pronounced right sided weakness and garbled speech. He was noted ot be dehydrated with BUN/Cr- 37/1.87 and was started on IVF for hydration. CT head revealed well defined area of cytotoxic edema left superior frontal cortex and subcortical white matter consistent with late acute cerebral infarction.  MRI/MRA brain done revealing acute/subacute hon-hemorrhagic infarct left frontal and additional areas left parietal and left occipital (question hypoperfusion), right parietal and right occipital lobes with question of embolic source. Age advanced atrophy with moderate distal SVD most evident in R-MCA branches. Carotid dopplers with 1- 39% ICA stenosis bilaterally. Neurology consulted felt that patient had recurrent symptoms due to dehydration and episode of hypotension and recommended work up for embolic source. TEE done revealing EF 30-35%, no PFO or ASD and loop recorder placed by Dr Melburn Popper. 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 Patient with resultant right sided weakness, right inattention, aphasia with expressive > receptive deficits.   Total: 10 NIH    Past Medical History  Past Medical History  Diagnosis Date  . Diabetes mellitus   . Dyslipidemia   . MI (myocardial infarction) 11/09/2008    2.5 x 23 Xience V DES to the CFX  . AAA (abdominal aortic aneurysm)     a. Abd U/S 7/14: mild aneurysmal dilatation 3x3 cm; cholelithiasis without evid of cholecystitis => repeat 1 year  . Coronary artery disease   . Arthritis     stenosis, lumbar region  . Renal insufficiency   . Stroke     Family History  family history includes CAD in his brother; Heart attack in his brother.  Prior Rehab/Hospitalizations:  Has the patient had major surgery during 100 days prior to admission? No  Current Medications   Current facility-administered medications:  . acetaminophen (TYLENOL) tablet 650 mg, 650 mg, Oral, Q4H PRN, 650 mg at  06-09-2015 2344 **OR** acetaminophen (TYLENOL) suppository 650 mg, 650 mg, Rectal, Q4H PRN,  Therisa Doyne, MD . ALPRAZolam Prudy Feeler) tablet 0.5 mg, 0.5 mg, Oral, BID, Therisa Doyne, MD, 0.5 mg at 05/27/15 0826 . aspirin suppository 300 mg, 300 mg, Rectal, Daily **OR** aspirin tablet 325 mg, 325 mg, Oral, Daily, Therisa Doyne, MD, 325 mg at 05/27/15 0827 . heparin injection 5,000 Units, 5,000 Units, Subcutaneous, 3 times per day, Marinus Maw, MD, 5,000 Units at 05/26/15 2222 . HYDROcodone-acetaminophen (NORCO) 10-325 MG per tablet 1 tablet, 1 tablet, Oral, Q6H PRN, Therisa Doyne, MD, 1 tablet at 05/27/15 0827 . insulin aspart (novoLOG) injection 0-9 Units, 0-9 Units, Subcutaneous, TID WC, Maretta Bees, MD, 3 Units at 05/27/15 1151 . insulin glargine (LANTUS) injection 30 Units, 30 Units, Subcutaneous, QHS, Therisa Doyne, MD, 30 Units at 05/26/15 2224 . ondansetron (ZOFRAN) injection 4 mg, 4 mg, Intravenous, Q6H PRN, Marinus Maw, MD . senna-docusate (Senokot-S) tablet 1 tablet, 1 tablet, Oral, QHS PRN, Therisa Doyne, MD, 1 tablet at 05/26/15 2222 . simvastatin (ZOCOR) tablet 40 mg, 40 mg, Oral, QHS, Therisa Doyne, MD, 40 mg at 05/26/15 2222  Patients Current Diet: Diet Heart Room service appropriate?: Yes; Fluid consistency:: Thin Diet - low sodium heart healthy Diet Carb Modified  Precautions / Restrictions Precautions Precautions: Fall Precaution Comments: expressive aphasia Restrictions Weight Bearing Restrictions: No   Has the patient had 2 or more falls or a fall with injury in the past year?No  Prior Activity Level Community (5-7x/wk): Pt independent, driving, cooking, mows, Sales promotion account executive / Equipment Home Equipment: Cane - single point, Shower seat  Prior Device Use: Indicate devices/aids used by the patient prior to current illness, exacerbation or injury? None of the above  Prior Functional Level Prior  Function Level of Independence: Independent Comments: daughter reports pt was independent with yard work, cooking, Education officer, environmental, etc   Self Care: Did the patient need help bathing, dressing, using the toilet or eating? Independent  Indoor Mobility: Did the patient need assistance with walking from room to room (with or without device)? Independent  Stairs: Did the patient need assistance with internal or external stairs (with or without device)? Independent  Functional Cognition: Did the patient need help planning regular tasks such as shopping or remembering to take medications? Independent  Current Functional Level Cognition  Arousal/Alertness: Awake/alert Overall Cognitive Status: Impaired/Different from baseline Difficult to assess due to: Impaired communication Current Attention Level: Selective Orientation Level: Oriented to place, Oriented to person Following Commands: Follows one step commands with increased time, Follows multi-step commands inconsistently Safety/Judgement: Decreased awareness of safety Attention: Sustained Sustained Attention: Appears intact   Extremity Assessment (includes Sensation/Coordination)  Upper Extremity Assessment: RUE deficits/detail RUE Deficits / Details: Pt able to reach ~70* forward flexion. elbow flexion ~3/5, pt demonstrates minimal thumb movement and minimal finger flexion during functional tasks. Pt does not flex/extend digits on command  RUE Coordination: decreased fine motor, decreased gross motor  Lower Extremity Assessment: Defer to PT evaluation RLE Deficits / Details: Grossly 4/5 strength compared to LLE at 5/5.    ADLs  Overall ADL's : Needs assistance/impaired Eating/Feeding: Moderate assistance, Bed level, Sitting Grooming: Wash/dry hands, Wash/dry face, Brushing hair, Oral care, Moderate assistance, Standing Grooming Details (indicate cue type and reason): Pt requires assist for thoroughness. Assist to locate items  and min A for standing balance  Upper Body Bathing: Moderate assistance, Sitting Lower Body Bathing: Moderate assistance, Sit to/from stand Upper Body Dressing : Moderate assistance, Sitting Lower Body Dressing: Moderate assistance, Sit to/from stand Lower Body Dressing Details (indicate cue  type and reason): Pt able to doff socks with supervision while seated. Requires mod A to don socks.  Toilet Transfer: Minimal assistance, Moderate assistance, Ambulation, Comfort height toilet Toilet Transfer Details (indicate cue type and reason): Pt ambulated into BR with min A, but lost balance as the turned toward rt requiring mod A to recover  Toileting- Clothing Manipulation and Hygiene: Moderate assistance, Sit to/from stand Functional mobility during ADLs: Minimal assistance, Moderate assistance General ADL Comments: Pt demonstrates Rt inattention requiring mod cues to attend fully to Rt UE (intermittent), and cues to locate items on Rt.     Mobility  Overal bed mobility: Needs Assistance Bed Mobility: Supine to Sit Supine to sit: Min guard Sit to supine: Min guard General bed mobility comments: min guard for safety due to impulsivity    Transfers  Overall transfer level: Needs assistance Equipment used: 1 person hand held assist Transfers: Sit to/from Stand Sit to Stand: Min assist Stand pivot transfers: Mod assist, Min assist General transfer comment: minA due to stagger left/right    Ambulation / Gait / Stairs / Wheelchair Mobility  Ambulation/Gait Ambulation/Gait assistance: Min assist, Mod assist Ambulation Distance (Feet): 150 Feet Assistive device: None Gait Pattern/deviations: Step-through pattern, Shuffle General Gait Details: pt at much slower cadence this date and reaching for hallway rails and furniture to hold onto. pt with 2 episodes of LOB during turing to R requring modA to regain balance. pt with slow reactiontime Gait velocity: dec Gait velocity  interpretation: Below normal speed for age/gender Stairs: Yes Stairs assistance: Min assist Stair Management: Two rails Number of Stairs: 10 General stair comments: requires 2 rails and minA to maintain balance due to difficulty sequencing    Posture / Balance Dynamic Sitting Balance Sitting balance - Comments: Pt occasionally loses balance to Rt when attempting to don/doff socks  Balance Overall balance assessment: Needs assistance Sitting-balance support: Feet supported Sitting balance-Leahy Scale: Fair Sitting balance - Comments: Pt occasionally loses balance to Rt when attempting to don/doff socks  Standing balance support: During functional activity Standing balance-Leahy Scale: Poor Standing balance comment: Pt requires min guard to min A for standing balance. Intermittently loses balance during grooming and dressing tasks     Special needs/care consideration Skin LOOP recorder implanted 9/12 Bowel mgmt: LBM 9/12 continent Bladder mgmt: continent Diabetic mgmt yes   Previous Home Environment Living Arrangements: Children Lives With: Daughter Available Help at Discharge: Family, Available 24 hours/day Type of Home: House Home Layout: One level Home Access: Level entry Bathroom Shower/Tub: Engineer, manufacturing systems: Standard Bathroom Accessibility: Yes How Accessible: Accessible via walker Home Care Services: Yes Type of Home Care Services: Home RN, Home PT, Other (Comment) (SLP) Home Care Agency (if known): [facility]  Discharge Living Setting Plans for Discharge Living Setting: House, Lives with (comment), Other (Comment) (dtr, [name]) Type of Home at Discharge: House Discharge Home Layout: One level Discharge Home Access: Level entry Discharge Bathroom Shower/Tub: Tub/shower unit Discharge Bathroom Toilet: Standard Discharge Bathroom Accessibility: Yes How Accessible: Accessible via walker Does the patient have any problems obtaining your  medications?: No  Social/Family/Support Systems Patient Roles: Parent Contact Information: Geophysical data processor, daughters Anticipated Caregiver: daughters and other family members Anticipated Industrial/product designer Information: see above Ability/Limitations of Caregiver: daughters both work, but will arrange 24/7 supervision at d/c Caregiver Availability: 24/7 Discharge Plan Discussed with Primary Caregiver: Yes Is Caregiver In Agreement with Plan?: Yes Does Caregiver/Family have Issues with Lodging/Transportation while Pt is in Rehab?: No  Goals/Additional  Needs Patient/Family Goal for Rehab: supervision with PT, supervision to min assist with OT and SLP Expected length of stay: ELOS 14-18 days Pt/Family Agrees to Admission and willing to participate: Yes Program Orientation Provided & Reviewed with Pt/Caregiver Including Roles & Responsibilities: Yes  Decrease burden of Care through IP rehab admission: n/a  Possible need for SNF placement upon discharge: not anticipated  Patient Condition: This patient's medical and functional status are the same since consult dated: 05/26/2015 in which the Rehabilitation Physician determined and documented that the patient's condition is appropriate for intensive rehabilitative care in an inpatient rehabilitation facility. Functional changes are: overall min to mod assist.. After evaluating the patient today and speaking with the Rehabilitation physician and acute team, the patient remains appropriate for inpatient rehab. Will admit to inpatient rehab today.  Preadmission Screen Completed By: Clois Dupes, 05/27/2015 3:48 PM ______________________________________________________________________  Discussed status with Dr. Laruth Bouchard on 05/27/2015 at 1547 and received telephone approval for admission today.  Admission Coordinator: Clois Dupes, time 1478 Date 05/27/2015.          Cosigned by: Ranelle Oyster, MD at 05/27/2015 4:14  PM  Revision History

## 2015-05-27 NOTE — Progress Notes (Signed)
STROKE TEAM PROGRESS NOTE   SUBJECTIVE (INTERVAL HISTORY) No family at bedside. Cre 1.31 today. Waiting for CUS. Still has aphasia.   OBJECTIVE Temp:  [97.6 F (36.4 C)-98.4 F (36.9 C)] 98.3 F (36.8 C) (09/13 1735) Pulse Rate:  [81-99] 99 (09/13 1735) Cardiac Rhythm:  [-] Heart block (09/13 0736) Resp:  [18-20] 18 (09/13 1735) BP: (99-145)/(39-84) 99/51 mmHg (09/13 1735) SpO2:  [96 %-100 %] 96 % (09/13 1735)  CBC:   Recent Labs Lab 05/24/15 1740  05/26/15 1347 05/27/15 0637  WBC 14.1*  < > 11.6* 8.3  NEUTROABS 10.1*  --   --   --   HGB 15.4  < > 16.3 15.9  HCT 43.2  < > 45.7 44.9  MCV 89.3  < > 89.8 89.1  PLT 214  < > 201 189  < > = values in this interval not displayed.  Basic Metabolic Panel:   Recent Labs Lab 05/25/15 0705 05/26/15 1347 05/27/15 0637  NA 136  --  134*  K 4.2  --  4.5  CL 105  --  100*  CO2 24  --  26  GLUCOSE 119*  --  171*  BUN 26*  --  19  CREATININE 1.31* 1.39* 1.31*  CALCIUM 8.9  --  9.1    Lipid Panel:     Component Value Date/Time   CHOL 119 05/25/2015 0705   TRIG 129 05/25/2015 0705   HDL 33* 05/25/2015 0705   CHOLHDL 3.6 05/25/2015 0705   VLDL 26 05/25/2015 0705   LDLCALC 60 05/25/2015 0705   HgbA1c:  Lab Results  Component Value Date   HGBA1C 7.5* 05/25/2015   Urine Drug Screen: No results found for: LABOPIA, COCAINSCRNUR, LABBENZ, AMPHETMU, THCU, LABBARB    IMAGING Ct Head Wo Contrast 05/24/2015    Well-defined area of cytotoxic edema affecting the LEFT superior frontal cortex and subcortical white matter consistent with a late acute cerebral infarction. No features to suggest proximal vascular occlusion or hemorrhagic transformation.  Chronic changes as described.    MR MRA head/brain without contrast  05/25/2015 1. Acute/subacute nonhemorrhagic infarct involving the left frontal lobe to level of the precentral gyrus. 2. Additional smaller non confluent punctate areas of infarction within the more anterior left  frontal lobe, the left parietal lobe, and left occipital lobe seen to follow a watershed distribution.This suggests more proximal disease, potentially in the neck, or and episode of hypotension. 3. At least 4 punctate foci of acute nonhemorrhagic infarct are noted within the right parietal and occipital lobe. Given the bilateral distribution, a central embolic source also needs to be considered. 4. Age advanced atrophy and diffuse white matter disease. 5. Moderate right and mild left mid A1 segment stenoses. 6. Moderate distal small vessel disease, most evident within the right MCA branch vessels.  TEE  Left Ventrical: Moderate LV dysfunction. EF 30-35% Mitral Valve: MILD MR Aortic Valve: normal AV  Tricuspid Valve: trace TR  Pulmonic Valve: normal PV Left Atrium/ Left atrial appendage: no thrombi  Atrial septum: no evidence of PFO or ASD by color Doppler flow or bubble study  Aorta: mild - moderate aortic calcifications  CUS - 1-39% right internal carotid artery stenosis and 60-79% left internal carotid artery stenosis. The right vertebral artery is patent with atypical flow antegrade flow. The left vertebral artery is patent with antegrade flow. The right subclavian artery is patent with antegrade biphasic flow.  Neurologic Exam  Mental Status: Alert, awake, intermittent crying.  Expressive aphasia, not  able to name or repeat.  Able to follow simple commands without difficulty. Cranial Nerves: II: Discs not visualized; Visual fields grossly normal, pupils equal, round, reactive to light and accommodation III,IV, VI: ptosis not present, extra-ocular motions intact bilaterally V,VII: Slight right facial droop, facial light touch sensation normal bilaterally VIII: hearing normal bilaterally IX,X: gag reflex present XI: bilateral shoulder shrug XII: midline tongue extension  Motor: Right : Upper extremity   4/5    Left:     Upper extremity   5/5  Lower extremity    4+/5     Lower extremity   5/5 Tone and bulk:normal tone throughout; no atrophy noted Sensory: Pinprick and light touch intact throughout, bilaterally Cerebellar: Unable to test Gait: Not tested   ASSESSMENT/PLAN [name] is a 74 y.o. male with history of diabetes mellitus, hyperlipidemia, coronary artery disease with previous MI, abdominal aortic aneurysm, renal insufficiency, and recent stroke presenting with worsening speech and right hemiparesis. He did not receive IV t-PA due to late presentation.  Strokes: suspect multiple bilateral anterior and posterior circulation infarcts possibly embolic of unknown etiology from last week, with recrudesce of stroke symptoms since Friday due to dehydration and elevated Cre  Resultant  speech difficulties was mild right hemiparesis (hand> Leg), both improving  MRI  multiple infarcts as above  MRA  moderate areas of stenosis  Carotid Doppler left ICA 50-69% stenosis  Will need CTA neck to further evaluation.   TEE no source of embolus, no PFO, but EF 30-35% down from 05/2014 50-55% EF.  Loop recorder placed 05/26/15 by Dr. Ladona Ridgel  LDL 60  HgbA1c 7.5  VTE prophylaxis - SCDs Diet Heart Room service appropriate?: Yes; Fluid consistency:: Thin Diet - low sodium heart healthy Diet Carb Modified  aspirin 81 mg orally every day prior to admission, now on aspirin 325 mg orally every day. Continue ASA on discharge.  Patient counseled to be compliant with his antithrombotic medications  Ongoing aggressive stroke risk factor management  Therapy recommendations: CIR  Disposition:  pending (daughter lives with pt)  Essential Hypertension  Occasionally low blood pressures otherwise stable. Not currently on antihypertensives medications.  BP goal normotensive Avoid hypotension  Hyperlipidemia  Home meds:  Zocor 40 mg daily resumed in hospital  LDL 60, goal < 70  Continue statin at discharge  Diabetes type II  HgbA1c 7.5,  goal < 7.0  Uncontrolled  Other Stroke Risk Factors  Advanced age  Cigarette smoker, quit smoking 21 years ago.  Hx stroke/TIA  Coronary artery disease - prior inferior MI 10/2008 with DES to LCX  Other Active Problems  Acute on chronic kidney disease stage III  Leukocytosis  AAA  Chronic back pain  Hospital day # 3  Marvel Plan, MD PhD Stroke Neurology 05/27/2015 5:48 PM    To contact Stroke Continuity provider, please refer to [email or web address]. After hours, contact General Neurology

## 2015-05-27 NOTE — Progress Notes (Signed)
Discharge orders received, Pt discharged to CIR. IV d/c'd. Family notified of the transfer. Staff bought pt downstairs via bed@ 05/27/15 1821. Called Dr. Roda Shutters to advise that the patient was being transferred and had not had CTA yet he advised that it could be done at [facility].

## 2015-05-27 NOTE — Evaluation (Signed)
Speech Language Pathology Evaluation Patient Details Name: Lucas Morgan MRN: [id number] DOB: [birth year] Today's Date: 05/27/2015 Time: 2440-1027 SLP Time Calculation (min) (ACUTE ONLY): 25 min  Problem List:  Patient Active Problem List   Diagnosis Date Noted  . Stroke 05/24/2015  . 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 Acute on chronic renal failure 05/24/2015  . CVA (cerebral infarction) 05/24/2015  . Leukocytosis 05/24/2015  . Foreign body in colon 04/08/2015  . Spinal stenosis of lumbar region 10/18/2014  . Lumbar stenosis with neurogenic claudication 10/18/2014  . Thyroid nodule 03/12/2013  . AAA (abdominal aortic aneurysm) 03/12/2013  . PULMONARY NODULE 08/14/2010  . COUGH 06/30/2010  . HYPOTENSION 05/04/2010  . AODM 01/27/2009  . HYPERCHOLESTEROLEMIA  IIA 01/27/2009  . CAD, NATIVE VESSEL 12/26/2008   Past Medical History:  Past Medical History  Diagnosis Date  . Diabetes mellitus   . Dyslipidemia   . MI (myocardial infarction) 11/09/2008    2.5 x 23 Xience V DES to the CFX  . AAA (abdominal aortic aneurysm)     a. Abd U/S 7/14: mild aneurysmal dilatation 3x3 cm; cholelithiasis without evid of cholecystitis => repeat 1 year  . Coronary artery disease   . Arthritis     stenosis, lumbar region  . Renal insufficiency   . Stroke    Past Surgical History:  Past Surgical History  Procedure Laterality Date  . Knee surgery    . Coronary stent placement    . Back surgery  2015    lumbar fusion  . Joint replacement Left   . 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 Lumbar laminectomy/decompression microdiscectomy N/A 10/18/2014    Procedure: LUMBAR LAMINECTOMY/DECOMPRESSION MICRODISCECTOMYLUMBAR THREE-FOUR ;  Surgeon: Temple Pacini, MD;  Location: [facility]URO ORS;  Service: Neurosurgery;  Laterality: N/A;  . Coronary angioplasty    . Ep implantable device N/A 05/26/2015    Procedure: Loop Recorder Insertion;  Surgeon: Marinus Maw, MD;   Location: [facility] INVASIVE CV LAB;  Service: Cardiovascular;  Laterality: N/A;   HPI:  74 y.o. male with history of diabetes mellitus, hyperlipidemia, coronary artery disease with previous MI, abdominal aortic aneurysm, renal insufficiency, and recent stroke presenting with worsening speech and right hemiparesis. MRI reveals acute/subacute nonhemorrhagic infarct involving the left frontal lobe to level of precentral gyrus; smaller non confluent punctate areas of infarction within the more anterior left frontal lobe, the left parietal lobe, and left occipital lobe seen to follow a watershed distribution; At least 4 punctate foci of acute nonhemorrhagic infarct are noted within the right parietal and occipital lobe.  Independent PTA.   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  Presents with relatively good comprehension, but impaired understanding of more complex/novel input.  Repetition is impaired. Speech output is marked by dysfluencies, paraphasias, complicated by underlying dysarthria due to the bilateral nature of infarcts.  Pt will require intensive speech intervention, addressing both short-term compensatory communication methods and longer-term rehabilitation of speech.       SLP Assessment  Patient needs continued Speech Lanaguage Pathology Services    Follow Up Recommendations  Inpatient Rehab    Frequency and Duration min 3x week  1 week   Pertinent Vitals/Pain Pain Assessment: No/denies pain   SLP Goals  Potential to Achieve Goals (ACUTE ONLY): Good  SLP Evaluation Prior Functioning  Cognitive/Linguistic Baseline: Within functional limits  Type of Home: House Available Help at Discharge: Family;Available 24 hours/day   Cognition  Arousal/Alertness: Awake/alert Attention: Sustained Sustained Attention: Appears intact    Comprehension  Auditory Comprehension Overall Auditory Comprehension:  Impaired Yes/No Questions: Impaired Paragraph Comprehension (via yes/no questions): 51-75% accurate Commands: Impaired Two Step Basic Commands: 50-74% accurate Conversation: Simple Visual Recognition/Discrimination Discrimination:  (90% accuracy on picture discrim subtest of BDAE) Reading Comprehension Reading Status: Not tested    Expression Expression Primary Mode of Expression: Verbal Verbal Expression Overall Verbal Expression: Impaired Initiation: No impairment Automatic Speech: Name;Counting (decreased motor programming) Level of Generative/Spontaneous Verbalization: Sentence Repetition: Impaired Level of Impairment: Word level (good repetition of C-V combos, more difficulty as phonemic c) Naming: Impairment Confrontation: Impaired Convergent: 25-49% accurate Written Expression Dominant Hand: Right Written Expression: Not tested   Oral / Motor Oral Motor/Sensory Function Overall Oral Motor/Sensory Function: Impaired (decrease CN VII and XII on right) Motor Speech Overall Motor Speech: Impaired Articulation: Impaired Intelligibility: Intelligibility reduced Word: 25-49% accurate Phrase: 25-49% accurate   GO     Blenda Mounts Laurice 05/27/2015, 12:14 PM

## 2015-05-27 NOTE — Progress Notes (Addendum)
*  PRELIMINARY RESULTS* Vascular Ultrasound Carotid Duplex (Doppler) has been completed.   Findings suggest 1-39% right internal carotid artery stenosis and 60-79% left internal carotid artery stenosis. The right vertebral artery is patent with atypical flow antegrade flow. The left vertebral artery is patent with antegrade flow. The right subclavian artery is patent with antegrade biphasic flow.  05/27/2015 2:48 PM Gertie Fey, RVT, RDCS, RDMS

## 2015-05-28 ENCOUNTER — Inpatient Hospital Stay (HOSPITAL_COMMUNITY): Payer: Medicare Other | Admitting: Speech Pathology

## 2015-05-28 ENCOUNTER — Inpatient Hospital Stay (HOSPITAL_COMMUNITY): Payer: Medicare Other | Admitting: Physical Therapy

## 2015-05-28 ENCOUNTER — Inpatient Hospital Stay (HOSPITAL_COMMUNITY): Payer: Medicare Other | Admitting: Occupational Therapy

## 2015-05-28 ENCOUNTER — Inpatient Hospital Stay (HOSPITAL_COMMUNITY): Payer: Medicare Other

## 2015-05-28 ENCOUNTER — Encounter (HOSPITAL_COMMUNITY): Payer: Self-pay | Admitting: Cardiovascular Disease

## 2015-05-28 DIAGNOSIS — I69320 Aphasia following cerebral infarction: Secondary | ICD-10-CM

## 2015-05-28 DIAGNOSIS — G8191 Hemiplegia, unspecified affecting right dominant side: Secondary | ICD-10-CM

## 2015-05-28 DIAGNOSIS — I69351 Hemiplegia and hemiparesis following cerebral infarction affecting right dominant side: Secondary | ICD-10-CM | POA: Diagnosis not present

## 2015-05-28 DIAGNOSIS — G8190 Hemiplegia, unspecified affecting unspecified side: Secondary | ICD-10-CM

## 2015-05-28 LAB — GLUCOSE, CAPILLARY
GLUCOSE-CAPILLARY: 173 mg/dL — AB (ref 65–99)
GLUCOSE-CAPILLARY: 223 mg/dL — AB (ref 65–99)
GLUCOSE-CAPILLARY: 192 mg/dL — AB (ref 65–99)
Glucose-Capillary: 341 mg/dL — ABNORMAL HIGH (ref 65–99)
Glucose-Capillary: 206 mg/dL — ABNORMAL HIGH (ref 65–99)

## 2015-05-28 MED ORDER — IOHEXOL 350 MG/ML SOLN
50.0000 mL | Freq: Once | INTRAVENOUS | Status: AC | PRN
  Administered 2015-05-28: 50 mL via INTRAVENOUS

## 2015-05-28 MED ORDER — SODIUM CHLORIDE 0.9 % IV SOLN
INTRAVENOUS | Status: DC
  Administered 2015-05-28 – 2015-05-30 (×3): via INTRAVENOUS

## 2015-05-28 NOTE — Progress Notes (Signed)
   Subjective/Complaints:   Objective: Vital Signs: Blood pressure 124/55, pulse 82, temperature 98.5 F (36.9 C), temperature source Oral, resp. rate 20, height  (1.676 m), weight 71.714 kg (158 lb 1.6 oz), SpO2 96 %. No results found. Results for orders placed or performed during the hospital encounter of 05/27/15 (from the past 72 hour(s))  Glucose, capillary     Status: Abnormal   Collection Time: 05/27/15  9:03 PM  Result Value Ref Range   Glucose-Capillary 341 (H) 65 - 99 mg/dL  Glucose, capillary     Status: Abnormal   Collection Time: 05/28/15  6:28 AM  Result Value Ref Range   Glucose-Capillary 173 (H) 65 - 99 mg/dL     HEENT: Left pupil opacified with reduced (?absent) vision Cardio: RRR and no murmur Resp: CTA B/L and unlabored GI: BS positive and no bowel sounds Extremity:  No Edema Skin:   Other loop recorder site with eccymosis and tenderness Neuro: Lethargic, Cranial Nerve Abnormalities Right central 7, Abnormal Sensory withdraws to pinch on right but unable to assess due to aphasia, Abnormal Motor 3- Right delt, bi, tri, 2- R WE, FE, 3- R HF, KE 2- ankle, Dysarthric and Aphasic Musc/Skel:  Swelling RIght knee Gen NAD   Assessment/Plan: 1. Functional deficits secondary to Bilateral ACA/PCA infarct which require 3+ hours per day of interdisciplinary therapy in a comprehensive inpatient rehab setting. Physiatrist is providing close team supervision and 24 hour management of active medical problems listed below. Physiatrist and rehab team continue to assess barriers to discharge/monitor patient progress toward functional and medical goals. FIM:                                  Medical Problem List and Plan: 1. 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   2.  DVT Prophylaxis/Anticoagulation: Pharmaceutical: Lovenox 3. Chronic pain/Pain Management: Continue hydrocodone prn for OA/DDD.    4. Adjustment reaction/  Mood: Team to provide ego support.  LCSW to follow for evaluation and support. Continue Xanax bid.  appears drowsy this am will reduce dose 5. Neuropsych: This patient is capable of making decisions on his own behalf. 6. Skin/Wound Care: routine pressure relief measures 7. Fluids/Electrolytes/Nutrition: Monitor I/O. Check lytes in am. Push po fluids may need nocturnal IVF 8. Acute Renal insufficiency: likely due to poor intake and hypotension.  Continue to hold lisinopril and avoid hypotension. Will monitor labs for trends 9.  CAD/AAA:  Stable on ASA and zocor.   10. DM type 2: Will monitor BS ac/hs. Was on lantus 75 and glypizide at home.  Will increase lantus to 40 units and add meal coverage for now.  Titrate towards home dose as intake improves. Use SSI for elevated BS.   11. HTN:  Blood pressures are still on low side. Continue to hold lisinopril and avoid hypotension/hypoperfusion. Offer fluids between meals.     12. Insomnia: should improve with increased activity  LOS (Days) 1 A FACE TO FACE EVALUATION WAS PERFORMED  Roch Quach E 05/28/2015, 7:44 AM

## 2015-05-28 NOTE — Care Management Note (Signed)
[facility] Individual Statement of Services  Patient Name:  Lucas Morgan  Date:  05/28/2015  Welcome to the [facility]patient [facility].  Our goal is to provide you with an individualized program based on your diagnosis and situation, designed to meet your specific needs.  With this comprehensive rehabilitation program, you will be expected to participate in at least 3 hours of rehabilitation therapies Monday-Friday, with modified therapy programming on the weekends.  Your rehabilitation program will include the following services:  Physical Therapy (PT), Occupational Therapy (OT), Speech Therapy (ST), 24 hour per day rehabilitation nursing, Therapeutic Recreaction (TR), Neuropsychology, Case Management (Social Worker), Rehabilitation Medicine, Nutrition Services and Pharmacy Services  Weekly team conferences will be held on Wednesday to discuss your progress.  Your Social Worker will talk with you frequently to get your input and to update you on team discussions.  Team conferences with you and your family in attendance may also be held.  Expected length of stay: 14-17 days  Overall anticipated outcome: supervision with cueing  Depending on your progress and recovery, your program may change. Your Social Worker will coordinate services and will keep you informed of any changes. Your Social Worker's name and contact numbers are listed  below.  The following services may also be recommended but are not provided by the Inpatient [facility]:   Driving Evaluations  Home Health Rehabiltiation Services  Outpatient Rehabilitation Service   Arrangements will be made to provide these services after discharge if needed.  Arrangements include referral to agencies that provide these services.  Your insurance has been verified to be:  [facility] Your primary doctor is:  Emilio Math  Pertinent information will be shared with your doctor and your insurance  company.  Social Worker:  Dossie Der, SW [phone] or [phone]  Information discussed with and copy given to patient by: Lucy Chris, 05/28/2015, 9:40 AM

## 2015-05-28 NOTE — Evaluation (Signed)
Occupational Therapy Assessment and Plan  Patient Details  Name: Lucas Morgan MRN: [id number] Date of Birth: Dec 19, 1940  OT Diagnosis: abnormal posture, cognitive deficits, disturbance of vision, hemiplegia affecting dominant side and coordination disorder Rehab Potential: Rehab Potential (ACUTE ONLY): Good ELOS: 14-17 days   Today's Date: 05/28/2015 OT Individual Time: 1000-1115 OT Individual Time Calculation (min): 75 min     Problem List:  Patient Active Problem List   Diagnosis Date Noted  . Aphasia S/P CVA 05/28/2015  . Right hemiparesis 05/28/2015  . Stroke, acute, embolic 17/51/0258  . Carotid stenosis   . Stroke 06/21/2015  . 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 Acute on chronic renal failure 2015/06/21  . CVA (cerebral infarction) Jun 21, 2015  . Leukocytosis 06-21-2015  . Foreign body in colon 04/08/2015  . Spinal stenosis of lumbar region 10/18/2014  . Lumbar stenosis with neurogenic claudication 10/18/2014  . Thyroid nodule 03/12/2013  . AAA (abdominal aortic aneurysm) 03/12/2013  . PULMONARY NODULE 08/14/2010  . COUGH 06/30/2010  . HYPOTENSION 05/04/2010  . AODM 01/27/2009  . HYPERCHOLESTEROLEMIA  IIA 01/27/2009  . CAD, NATIVE VESSEL 12/26/2008    Past Medical History:  Past Medical History  Diagnosis Date  . Diabetes mellitus   . Dyslipidemia   . MI (myocardial infarction) 11/09/2008    2.5 x 23 Xience V DES to the CFX  . AAA (abdominal aortic aneurysm)     a. Abd U/S 7/14: mild aneurysmal dilatation 3x3 cm; cholelithiasis without evid of cholecystitis => repeat 1 year  . Coronary artery disease   . Arthritis     stenosis, lumbar region  . Renal insufficiency   . Stroke    Past Surgical History:  Past Surgical History  Procedure Laterality Date  . Knee surgery    . Coronary stent placement    . Back surgery  2015    lumbar fusion  . Joint replacement Left   . 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 Lumbar  laminectomy/decompression microdiscectomy N/A 10/18/2014    Procedure: LUMBAR LAMINECTOMY/DECOMPRESSION MICRODISCECTOMYLUMBAR THREE-FOUR ;  Surgeon: Charlie Pitter, MD;  Location: [place] [facility];  Service: Neurosurgery;  Laterality: N/A;  . Coronary angioplasty    . Ep implantable device N/A 05/26/2015    Procedure: Loop Recorder Insertion;  Surgeon: Evans Lance, MD;  Location: [place] [facility];  Service: Cardiovascular;  Laterality: N/A;    Assessment & Plan Clinical Impression: Patient is a 74 y.o. year old male with history of DM type 2, CAD, DDD with neurogenic claudication, CVA with aphasia and right sided weakness which had resolved after 3 days and he was discharged from [facility] on 05/[facility]/15. He was readmitted on 2015/06/21 with pronounced right sided weakness and garbled speech. He was noted ot be dehydrated with BUN/Cr- 37/1.87 and was started on IVF for hydration. CT head revealed well defined area of cytotoxic edema left superior frontal cortex and subcortical white matter consistent with late acute cerebral infarction.  MRI/MRA brain done revealing acute/subacute hon-hemorrhagic infarct left frontal and additional areas left parietal and left occipital (question hypoperfusion), right parietal and right occipital lobes with question of embolic source. Age advanced atrophy with moderate distal SVD most evident in R-MCA branches. Carotid dopplers with 1- 39% ICA stenosis bilaterally. Neurology consulted felt that patient had recurrent symptoms due to dehydration and episode of hypotension and recommended work up for embolic source. TEE done revealing EF  30-35%, no PFO or ASD and loop recorder placed by Dr Cathie Olden. 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 Patient with resultant right sided weakness, right inattention, aphasia with expressive > receptive deficits.   Patient transferred to CIR on 05/27/2015 .    Patient currently requires max with basic self-care  skills secondary to muscle weakness, decreased cardiorespiratoy endurance, decreased coordination and decreased motor planning, field cut, decreased attention, decreased awareness, decreased problem solving, decreased safety awareness and delayed processing and decreased sitting balance, decreased standing balance, decreased postural control, hemiplegia and decreased balance strategies.  Prior to hospitalization, patient could complete ADLs and IADLs with independent .  Patient will benefit from skilled intervention to increase independence with basic self-care skills prior to discharge home with care partner.  Anticipate patient will require 24 hour supervision and follow up home health.  OT - End of Session Activity Tolerance: Decreased this session Endurance Deficit: Yes Endurance Deficit Description: Pt requiring multiple rest breaks secondary to fatigue this session OT Assessment Rehab Potential (ACUTE ONLY): Good Barriers to Discharge:  (none known at this time) OT Patient demonstrates impairments in the following area(s): Balance;Behavior;Cognition;Endurance;Motor;Sensory;Safety;Perception;Vision OT Basic ADL's Functional Problem(s): Eating;Grooming;Bathing;Toileting;Dressing OT Advanced ADL's Functional Problem(s): Simple Meal Preparation OT Transfers Functional Problem(s): Toilet;Tub/Shower OT Additional Impairment(s): Fuctional Use of Upper Extremity OT Plan OT Intensity: Minimum of 1-2 x/day, 45 to 90 minutes OT Frequency: 5 out of 7 days OT Duration/Estimated Length of Stay: 14-17 days OT Treatment/Interventions: Medical illustrator training;Community reintegration;Neuromuscular re-education;Patient/family education;Self Care/advanced ADL retraining;Therapeutic Exercise;UE/LE Coordination activities;Wheelchair propulsion/positioning;Cognitive remediation/compensation;Discharge planning;DME/adaptive equipment instruction;Functional mobility training;Psychosocial support;Therapeutic  Activities;UE/LE Strength taining/ROM;Visual/perceptual remediation/compensation OT Self Feeding Anticipated Outcome(s): supervision OT Basic Self-Care Anticipated Outcome(s): supervision OT Toileting Anticipated Outcome(s): supervision OT Bathroom Transfers Anticipated Outcome(s): supervision OT Recommendation Recommendations for Other Services:  (none) Patient destination: Home Follow Up Recommendations: Home health OT;24 hour supervision/assistance Equipment Recommended: None recommended by OT Equipment Details: pt has all needed equipment   Skilled Therapeutic Intervention Upon entering the room, pt seated in wheelchair with no c/o,signs, or symptoms of pain. Pt very impulsive this session and requiring mod verbal cues and tactile cues for safety awareness. Pt ambulating with hand held min - mod A into bathroom for bathing at shower level while seated on TTB. Pt requiring hand over hand assist to incorporate R UE into functional tasks this session. Pt requiring hand over hand assist as well secondary to perseveration when bathing. Pt seated on commode chair for dressing tasks when family arrived. Daughter very distracting to pt and required education regarding OT purpose, POC, and goals as family attempting to assist pt during evaluation. Pt seated in recliner chair with call bell and all needed items.   OT Evaluation Precautions/Restrictions  Precautions Precautions: Fall Precaution Comments: expressive aphasia Restrictions Weight Bearing Restrictions: No  Pain Pain Assessment Pain Assessment: No/denies pain Pain Score: 0-No pain Faces Pain Scale: Hurts little more Pain Type: Acute pain Pain Location: Back Pain Descriptors / Indicators: Aching Pain Intervention(s): Medication (See eMAR) Home Living/Prior Functioning Home Living Available Help at Discharge: Family, Available 24 hours/day Type of Home: House Home Access: Level entry Home Layout: One level Bathroom  Shower/Tub: Tub/shower unit, Air cabin crew Accessibility: Yes Additional Comments: has TTB and grab bars in shower, BSC, walker, cane, wheelchair per daughters report  Lives With: Daughter Prior Function Level of Independence: Independent with homemaking with ambulation, Independent with gait, Independent with transfers, Independent with basic ADLs  Able to Take Stairs?: Yes  Driving: No Comments: yard work, cooking, Clinical biochemist- History Baseline Vision/History: 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) Patient Visual Report: No change from baseline Vision- Assessment Vision Assessment?: Yes;Vision impaired- to be further tested in functional context Visual Fields: Right homonymous hemianopsia  Cognition Overall Cognitive Status: Impaired/Different from baseline Arousal/Alertness: Awake/alert Orientation Level: Other(comment) (secondary to aphasia) Awareness: Impaired Awareness Impairment: Intellectual impairment Problem Solving: Impaired Problem Solving Impairment: Verbal basic;Functional basic Behaviors: Impulsive;Perseveration Safety/Judgment: Impaired Comments: quick release belt to be placed on pt secondary to impulsivity with pt attempting to stand independently multiple times during evaluation Sensation Sensation Light Touch: Impaired by gross assessment Stereognosis: Not tested Hot/Cold: Impaired by gross assessment Coordination Gross Motor Movements are Fluid and Coordinated: No Fine Motor Movements are Fluid and Coordinated: No Coordination and Movement Description: hemiplegia dominant side Motor  Motor Motor: Hemiplegia;Abnormal postural alignment and control Motor - Skilled Clinical Observations: endurance, dominant side hemiplegia Mobility  Bed Mobility Bed Mobility: Sit to Supine;Scooting to HOB Sit to Supine: 4: Min guard Scooting to [facility]: 5: Supervision Scooting to [facility] Details: Verbal cues for sequencing;Visual  cues/gestures for sequencing Transfers Sit to Stand: 4: Min guard Sit to Stand Details: Verbal cues for precautions/safety Stand to Sit: 4: Min guard Stand to Sit Details (indicate cue type and reason): Verbal cues for precautions/safety  Trunk/Postural Assessment  Cervical Assessment Cervical Assessment: Exceptions to [facility] (forward head) Cervical AROM Overall Cervical AROM: Within functional limits for tasks performed Thoracic Assessment Thoracic Assessment: Exceptions to [facility] (rounded shoulders) Thoracic AROM Overall Thoracic AROM: Within functional limits for tasks performed Lumbar Assessment Lumbar Assessment: Exceptions to [facility] (posterior pelvic tilt) Postural Control Postural Control: Deficits on evaluation  Balance Balance Balance Assessed: Yes Standardized Balance Assessment Standardized Balance Assessment: Berg Balance Test Berg Balance Test Sit to Stand: Able to stand  independently using hands Standing Unsupported: Able to stand 30 seconds unsupported Sitting with Back Unsupported but Feet Supported on Floor or Stool: Able to sit safely and securely 2 minutes Stand to Sit: Uses backs of legs against chair to control descent Transfers: Able to transfer with verbal cueing and /or supervision Standing Unsupported with Eyes Closed: Unable to keep eyes closed 3 seconds but stays steady From Standing, Reach Forward with Outstretched Arm: Reaches forward but needs supervision From Standing Position, Pick up Object from Floor: Able to pick up shoe, needs supervision From Standing Position, Turn to Look Behind Over each Shoulder: Looks behind one side only/other side shows less weight shift Standing Unsupported, Alternately Place Feet on Step/Stool: Able to complete 4 steps without aid or supervision Static Sitting Balance Static Sitting - Balance Support: Feet supported Static Sitting - Level of Assistance: 5: Stand by assistance Dynamic Sitting Balance Dynamic Sitting -  Balance Support: Feet unsupported;During functional activity Dynamic Sitting - Level of Assistance: 4: Min assist Dynamic Sitting - Balance Activities: Forward lean/weight shifting;Lateral lean/weight shifting;Reaching for objects;Reaching across midline Static Standing Balance Static Standing - Balance Support: Right upper extremity supported;Left upper extremity supported;Bilateral upper extremity supported Static Standing - Level of Assistance: 4: Min assist Dynamic Standing Balance Dynamic Standing - Balance Support: During functional activity;Left upper extremity supported;No upper extremity supported Dynamic Standing - Level of Assistance: 4: Min assist;3: Mod assist Extremity/Trunk Assessment RUE Assessment RUE Assessment: Exceptions to [facility] RUE AROM (degrees) Overall AROM Right Upper Extremity: Deficits;Other (comment) (shoulder elevation and abduction ~ 90 degrees) RUE Strength RUE Overall Strength: Deficits RUE Overall Strength Comments: 3-/5 throughout shoulder,elbow,wrist, and 2-/5 in digits LUE Assessment LUE  Assessment: Within Functional Limits   See Function Navigator for Current Functional Status.   Refer to Care Plan for Long Term Goals  Recommendations for other services: None  Discharge Criteria: Patient will be discharged from OT if patient refuses treatment 3 consecutive times without medical reason, if treatment goals not met, if there is a change in medical status, if patient makes no progress towards goals or if patient is discharged from hospital.  The above assessment, treatment plan, treatment alternatives and goals were discussed and mutually agreed upon: by patient  Phineas Semen 05/28/2015, 12:23 PM

## 2015-05-28 NOTE — Evaluation (Signed)
Physical Therapy Assessment and Plan  Patient Details  Name: Lucas Morgan MRN: [id number] Date of Birth: Aug 07, 1941  PT Diagnosis: Abnormal posture, Abnormality of gait, Cognitive deficits, Coordination disorder, Hemiparesis dominant, Impaired cognition and Low back pain Rehab Potential: Good ELOS: 14-17 days   Today's Date: 05/28/2015 PT Individual Time: 4098-1191 PT Individual Time Calculation (min): 75 min    Problem List:  Patient Active Problem List   Diagnosis Date Noted  . Aphasia S/P CVA 05/28/2015  . Right hemiparesis 05/28/2015  . Stroke, acute, embolic 47/82/9562  . Carotid stenosis   . Stroke 2015-05-27  . 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 Acute on chronic renal failure 27-May-2015  . CVA (cerebral infarction) 2015/05/27  . Leukocytosis May 27, 2015  . Foreign body in colon 04/08/2015  . Spinal stenosis of lumbar region 10/18/2014  . Lumbar stenosis with neurogenic claudication 10/18/2014  . Thyroid nodule 03/12/2013  . AAA (abdominal aortic aneurysm) 03/12/2013  . PULMONARY NODULE 08/14/2010  . COUGH 06/30/2010  . HYPOTENSION 05/04/2010  . AODM 01/27/2009  . HYPERCHOLESTEROLEMIA  IIA 01/27/2009  . CAD, NATIVE VESSEL 12/26/2008    Past Medical History:  Past Medical History  Diagnosis Date  . Diabetes mellitus   . Dyslipidemia   . MI (myocardial infarction) 11/09/2008    2.5 x 23 Xience V DES to the CFX  . AAA (abdominal aortic aneurysm)     a. Abd U/S 7/14: mild aneurysmal dilatation 3x3 cm; cholelithiasis without evid of cholecystitis => repeat 1 year  . Coronary artery disease   . Arthritis     stenosis, lumbar region  . Renal insufficiency   . Stroke    Past Surgical History:  Past Surgical History  Procedure Laterality Date  . Knee surgery    . Coronary stent placement    . Back surgery  2015    lumbar fusion  . Joint replacement Left   . 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 Lumbar  laminectomy/decompression microdiscectomy N/A 10/18/2014    Procedure: LUMBAR LAMINECTOMY/DECOMPRESSION MICRODISCECTOMYLUMBAR THREE-FOUR ;  Surgeon: Charlie Pitter, MD;  Location: [place] [facility]URO ORS;  Service: Neurosurgery;  Laterality: N/A;  . Coronary angioplasty    . Ep implantable device N/A 05/26/2015    Procedure: Loop Recorder Insertion;  Surgeon: Evans Lance, MD;  Location: [place] CV LAB;  Service: Cardiovascular;  Laterality: N/A;  . Tee without cardioversion N/A 05/26/2015    Procedure: TRANSESOPHAGEAL ECHOCARDIOGRAM (TEE);  Surgeon: Thayer Headings, MD;  Location: [facility] ENDOSCOPY;  Service: Cardiovascular;  Laterality: N/A;    Assessment & Plan Clinical Impression: Lucas Morgan is a 74 y.o. RH-male with history of DM type 2, CAD, DDD with neurogenic claudication, CVA with aphasia and right sided weakness which had resolved after 3 days and he was discharged from [facility] on 05/23/15.  He was readmitted on  05/27/15 with pronounced right sided weakness and garbled speech. He was noted ot be dehydrated with BUN/Cr- 37/1.87 and was started on IVF for hydration. CT head revealed well defined area of cytotoxic edema left superior frontal cortex and subcortical white matter consistent with late acute cerebral infarction.    MRI/MRA brain done revealing acute/subacute hon-hemorrhagic infarct left frontal and additional areas left parietal and left occipital  (question hypoperfusion), right parietal and right occipital lobes with question of embolic source.  Age advanced atrophy with moderate distal SVD most evident in R-MCA branches.  Carotid dopplers with 1- 39% ICA stenosis bilaterally. Neurology consulted felt that patient had recurrent symptoms due to dehydration and episode of hypotension and recommended work up for embolic source. TEE done revealing EF 30-35%, no PFO or ASD and loop recorder placed by Dr Cathie Olden.  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 Patient with  resultant right sided weakness, right inattention, aphasia with expressive > receptive deficits.  Patient transferred to CIR on 05/27/2015 .   Patient currently requires min with mobility secondary to muscle weakness, decreased coordination and decreased motor planning, field cut and decreased standing balance, decreased postural control, hemiplegia and decreased balance strategies.  Prior to hospitalization, patient was independent  with mobility and lived with Daughter [name]) in a House home.  Home access is  Level entry.  Patient will benefit from skilled PT intervention to maximize safe functional mobility, minimize fall risk and decrease caregiver burden for planned discharge home with 24 hour supervision.  Anticipate patient will benefit from follow up [place] at discharge.  PT - End of Session Activity Tolerance: Tolerates 30+ min activity with multiple rests Endurance Deficit: Yes PT Assessment Rehab Potential (ACUTE/IP ONLY): Good Barriers to Discharge: Other (comment) (knowledge deficit) PT Patient demonstrates impairments in the following area(s): Balance;Endurance;Motor;Pain;Safety PT Transfers Functional Problem(s): Furniture;Car;Bed to Chair;Bed Mobility PT Locomotion Functional Problem(s): Ambulation;Stairs PT Plan PT Intensity: Minimum of 1-2 x/day ,45 to 90 minutes PT Frequency: 5 out of 7 days PT Duration Estimated Length of Stay: 14-17 days PT Treatment/Interventions: Ambulation/gait training;Balance/vestibular training;Discharge planning;Community reintegration;DME/adaptive equipment instruction;Functional mobility training;Neuromuscular re-education;Patient/family education;Splinting/orthotics;Stair training;Therapeutic Activities;Therapeutic Exercise;UE/LE Strength taining/ROM;Visual/perceptual remediation/compensation;UE/LE Coordination activities;Wheelchair propulsion/positioning PT Transfers Anticipated Outcome(s): supervision PT Locomotion Anticipated Outcome(s):  supervision PT Recommendation Recommendations for Other Services: Neuropsych consult Follow Up Recommendations: Home health PT;24 hour supervision/assistance Patient destination: Home Equipment Recommended: To be determined Equipment Details: none anticipated at this time  Skilled Therapeutic Intervention Pt received in bathroom with RN present and agreeable to therapy evaluation.  PT provided steady A while patient transferred off toilet using grab bar, performed hygiene, and adjusted clothing with verbal and visual cues for use of left hand to manage R side of underwear and pants.  PT provided steady A and verbal/visual cues for hand hygiene and management of dentures (see care tool).  Pt ambulates with steady A or HHA on R or L hand, but prefers HHA on L due to significant visual field deficits.  Pt negotiates 8 steps with 2 hand rails and steady A with verbal cues for attention to R hand.  Pt performs car transfer with steady A and verbal/visual cues for correct sequencing and safety.  PT initiated BERG balance screening but patient demos noticeable fatigue and requesting to continue tomorrow.  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    PT Evaluation Precautions/Restrictions Precautions Precautions: Fall Precaution Comments: expressive aphasia, impulsive Restrictions Weight Bearing Restrictions: No Pain Pain Assessment Pain Assessment: 0-10 Pain Score: 6  Faces Pain Scale: Hurts whole lot Pain Type: Chronic pain Pain Location: Back Pain Orientation: Lower Pain Descriptors / Indicators: Aching Pain Intervention(s): Repositioned;Emotional support Home Living/Prior Functioning Home Living Living Arrangements: Children Available Help at Discharge: Family;Available 24 hours/day Type of Home: House Home Access:  Level entry Home Layout: One level  Lives With: Daughter [name]) Prior Function Level of  Independence: Independent with transfers;Independent with gait;Independent with homemaking with ambulation;Independent with basic ADLs  Able to Take Stairs?: Yes Driving: Yes Vocation: Retired Comments: yard work, cooking, Clinical biochemist - Assessment Eye Alignment: Impaired (comment) Ocular Range of Motion: Impaired-to be further tested in functional context Tracking/Visual Pursuits: Left eye does not track medially;Decreased smoothness of horizontal tracking Convergence: Impaired - to be further tested in functional context  Cognition Overall Cognitive Status: Impaired/Different from baseline Arousal/Alertness: Awake/alert Orientation Level:  (difficult to assess given expressive aphasia) Awareness: Impaired Problem Solving: Impaired Problem Solving Impairment: Functional basic;Verbal basic Behaviors: Impulsive;Perseveration Safety/Judgment: Impaired Sensation Sensation Light Touch: Appears Intact (LEs and UEs) Hot/Cold: Impaired by gross assessment Coordination Gross Motor Movements are Fluid and Coordinated: No Fine Motor Movements are Fluid and Coordinated: No Coordination and Movement Description: hemiplegia dominant side Finger Nose Finger Test: dysmetria noted bilaterally R worse than L Heel Shin Test: decreased ROM bilaterally, decreased accuracy on R Motor  Motor Motor: Hemiplegia;Abnormal postural alignment and control Motor - Skilled Clinical Observations: hemiplegia of dominant side, decreased endurance  Mobility Bed Mobility Bed Mobility: Sit to Supine;Scooting to HOB Sit to Supine: 4: Min guard Scooting to [facility]: 5: Supervision Scooting to [facility] Details: Verbal cues for sequencing;Visual cues/gestures for sequencing Transfers Transfers: Yes Sit to Stand: 4: Min guard Sit to Stand Details: Verbal cues for precautions/safety Stand to Sit: 4: Min  guard Stand to Sit Details (indicate cue type and reason): Verbal cues for precautions/safety Locomotion  Ambulation Ambulation: Yes Ambulation/Gait Assistance: 4: Min assist Ambulation Distance (Feet): 200 Feet Assistive device: 1 person hand held assist Ambulation/Gait Assistance Details: Verbal cues for precautions/safety;Visual cues/gestures for precautions/safety Ambulation/Gait Assistance Details: hand held assist on R or L with tactile guiding cues for directional changes Gait Gait: Yes Gait Pattern: Impaired Gait Pattern: Decreased step length - right;Decreased step length - left;Step-to pattern;Shuffle Stairs / Additional Locomotion Stairs: Yes Stairs Assistance: 4: Min assist Stair Management Technique: Two rails Number of Stairs: 8 Wheelchair Mobility Wheelchair Mobility: No  Trunk/Postural Assessment  Cervical Assessment Cervical Assessment: Exceptions to [facility] (resting forward head posture) Cervical AROM Overall Cervical AROM: Within functional limits for tasks performed Thoracic Assessment Thoracic Assessment: Exceptions to [facility] (rounded shoulders at rest ) Thoracic AROM Overall Thoracic AROM: Within functional limits for tasks performed Postural Control Postural Control: Deficits on evaluation  Balance Balance Balance Assessed: Yes Standardized Balance Assessment Standardized Balance Assessment: Berg Balance Test Berg Balance Test Sit to Stand: Able to stand  independently using hands Standing Unsupported: Able to stand 30 seconds unsupported Sitting with Back Unsupported but Feet Supported on Floor or Stool: Able to sit safely and securely 2 minutes Stand to Sit: Uses backs of legs against chair to control descent Transfers: Able to transfer with verbal cueing and /or supervision Standing Unsupported with Eyes Closed: Unable to keep eyes closed 3 seconds but stays steady From Standing, Reach Forward with Outstretched Arm: Reaches forward but needs  supervision From Standing Position, Pick up Object from Floor: Able to pick up shoe, needs supervision From Standing Position, Turn to Look Behind Over each Shoulder: Looks behind one side only/other side shows less weight shift Standing Unsupported, Alternately Place Feet on Step/Stool: Able to complete 4 steps without aid or supervision Extremity Assessment      RLE Assessment RLE Assessment: Exceptions to [facility] RLE Strength RLE Overall Strength Comments: hip flexion 3/5, knee extension 4/5, knee flexion 3+/5, DF 4/5, PF 3+/5 LLE Assessment LLE Assessment: Exceptions to [facility] LLE Strength LLE  Overall Strength Comments: hip flexion 3+/5, knee extension 4/5, knee flexion 4/5, DF 4/5, PF 4/5    See Function Navigator for Current Functional Status.   Refer to Care Plan for Long Term Goals  Recommendations for other services: None  Discharge Criteria: Patient will be discharged from PT if patient refuses treatment 3 consecutive times without medical reason, if treatment goals not met, if there is a change in medical status, if patient makes no progress towards goals or if patient is discharged from hospital.  The above assessment, treatment plan, treatment alternatives and goals were discussed and mutually agreed upon: by patient  Urban Gibson E Penven-Crew 05/28/2015, 4:06 PM

## 2015-05-28 NOTE — Evaluation (Signed)
Speech Language Pathology Assessment and Plan  Patient Details  Name: Lucas Morgan MRN: [id number] Date of Birth: April 24, 1941  SLP Diagnosis: Aphasia;Dysarthria;Cognitive Impairments  Rehab Potential: Good ELOS: 14-17 days    Today's Date: 05/28/2015 SLP Individual Time: 8676-1950 SLP Individual Time Calculation (min): 57 min   Problem List:  Patient Active Problem List   Diagnosis Date Noted  . Aphasia S/P CVA 05/28/2015  . Right hemiparesis 05/28/2015  . Stroke, acute, embolic 93/26/7124  . Carotid stenosis   . Stroke 06-01-2015  . 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 Acute on chronic renal failure 06/01/15  . CVA (cerebral infarction) 06/01/15  . Leukocytosis 06/01/15  . Foreign body in colon 04/08/2015  . Spinal stenosis of lumbar region 10/18/2014  . Lumbar stenosis with neurogenic claudication 10/18/2014  . Thyroid nodule 03/12/2013  . AAA (abdominal aortic aneurysm) 03/12/2013  . PULMONARY NODULE 08/14/2010  . COUGH 06/30/2010  . HYPOTENSION 05/04/2010  . AODM 01/27/2009  . HYPERCHOLESTEROLEMIA  IIA 01/27/2009  . CAD, NATIVE VESSEL 12/26/2008   Past Medical History:  Past Medical History  Diagnosis Date  . Diabetes mellitus   . Dyslipidemia   . MI (myocardial infarction) 11/09/2008    2.5 x 23 Xience V DES to the CFX  . AAA (abdominal aortic aneurysm)     a. Abd U/S 7/14: mild aneurysmal dilatation 3x3 cm; cholelithiasis without evid of cholecystitis => repeat 1 year  . Coronary artery disease   . Arthritis     stenosis, lumbar region  . Renal insufficiency   . Stroke    Past Surgical History:  Past Surgical History  Procedure Laterality Date  . Knee surgery    . Coronary stent placement    . Back surgery  2015    lumbar fusion  . Joint replacement Left   . 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 Lumbar laminectomy/decompression microdiscectomy N/A 10/18/2014    Procedure: LUMBAR LAMINECTOMY/DECOMPRESSION  MICRODISCECTOMYLUMBAR THREE-FOUR ;  Surgeon: Charlie Pitter, MD;  Location: [place] NEURO ORS;  Service: Neurosurgery;  Laterality: N/A;  . Coronary angioplasty    . Ep implantable device N/A 05/26/2015    Procedure: Loop Recorder Insertion;  Surgeon: Evans Lance, MD;  Location: [place] CV LAB;  Service: Cardiovascular;  Laterality: N/A;  . Tee without cardioversion N/A 05/26/2015    Procedure: TRANSESOPHAGEAL ECHOCARDIOGRAM (TEE);  Surgeon: Thayer Headings, MD;  Location: [facility] ENDOSCOPY;  Service: Cardiovascular;  Laterality: N/A;    Assessment / Plan / Recommendation Clinical Impression Lucas Morgan is a 74 year old right handed male with history of DM type 2, CAD, DDD with neurogenic claudication, CVA with aphasia and right sided weakness which had resolved after 3 days and he was discharged from [facility] on 05/23/15.  He was readmitted on 06/01/15 with pronounced right sided weakness and garbled speech. CT head revealed well defined area of cytotoxic edema left superior frontal cortex and subcortical white matter consistent with late acute cerebral infarction.  MRI/MRA brain done revealing acute/subacute hon-hemorrhagic infarct left frontal and additional areas left parietal and left occipital  (question hypoperfusion), right parietal and right occipital lobes with question of embolic source.  Age advanced atrophy with moderate distal SVD most evident in R-MCA branches.  Patient with continued right sided weakness, right inattention, aphasia with expressive > receptive deficits. Therapy ongoing and CIR was recommended by MD and Rehab team.  Patient  was admitted to [facility] 05/27/15 and demonstrates moderate cognitive impairments characterized by poor intellectual awareness of deficits, impulsivity and right inattention impacting safety with basic self-care tasks.  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  Patient demonstrates relatively  intact comprehension of basic things, but has impairments with more complex information.  Patient also demonstrates verbal perseveration of his paraphasic errors with inconsistent awareness.  Patient would benefit from skilled SLP intervention in order to maximize his functional independence prior to discharge. Anticipate patient will require 24 hour supervision at home and follow up SLP services.    Skilled Therapeutic Interventions          Cognitive-linguistic and bedside swallow evaluations completed with results and recommendations reviewed with patient, no family present.     SLP Assessment  Patient will need skilled Speech Lanaguage Pathology Services during CIR admission    Recommendations  SLP Diet Recommendations: Age appropriate regular solids;Thin Liquid Administration via: Cup;Straw Medication Administration: Whole meds with liquid Supervision: Patient able to self feed;Intermittent supervision to cue for compensatory strategies Compensations: Slow rate;Small sips/bites Postural Changes and/or Swallow Maneuvers: Out of bed for meals Oral Care Recommendations: Oral care BID Patient destination: Home Follow up Recommendations: Outpatient SLP;24 hour supervision/assistance Equipment Recommended: None recommended by SLP    SLP Frequency 3 to 5 out of 7 days   SLP Treatment/Interventions Cognitive remediation/compensation;Cueing hierarchy;Environmental controls;Functional tasks;Patient/family education;Speech/Language facilitation;Therapeutic Activities   Pain Pain Assessment Pain Assessment: No/denies pain Prior Functioning Cognitive/Linguistic Baseline: Within functional limits (unable to read ) Type of Home: House  Lives With: Daughter [name]) Vocation: Retired  Function:  Eating Eating   Modified Consistency Diet: No Eating Assist Level: Set up assist for;Supervision or verbal cues   Eating Set Up Assist For: Opening containers       Cognition Comprehension  Comprehension assist level: Understands basic 75 - 89% of the time/ requires cueing 10 - 24% of the time  Expression   Expression assist level: Expresses basis less than 25% of the time/requires cueing >75% of the time.  Social Interaction Social Interaction assist level: Interacts appropriately 50 - 74% of the time - May be physically or verbally inappropriate.  Problem Solving Problem solving assist level: Solves basic 25 - 49% of the time - needs direction more than half the time to initiate, plan or complete simple activities  Memory Memory assist level: Recognizes or recalls 25 - 49% of the time/requires cueing 50 - 75% of the time   Short Term Goals: Week 1: SLP Short Term Goal 1 (Week 1): Pt will follow 2-3 step directions with Sueprvision level verbal cues SLP Short Term Goal 2 (Week 1): Pt will utilize pictures and gestures as non-verbal means of communicating basic needs and wants with Mod assist   SLP Short Term Goal 3 (Week 1): Pt will verbally express phrase length utterances durign structured tasks with Mod semantic cues.  SLP Short Term Goal 4 (Week 1): Pt will demonstrate basic problem solving with Min verbal and visual cues SLP Short Term Goal 5 (Week 1): Pt will attend to right upper extremity and environment durign structured tasks with Min verbal and visual cues SLP Short Term Goal 6 (Week 1): Pt will request help as needed during self-care tasks with Mod question cues  Refer to Care Plan for Long Term Goals  Recommendations for other services: None  Discharge Criteria: Patient will be discharged from SLP if patient refuses treatment 3 consecutive times without medical reason, if treatment  goals not met, if there is a change in medical status, if patient makes no progress towards goals or if patient is discharged from hospital.  The above assessment, treatment plan, treatment alternatives and goals were discussed and mutually agreed upon: by patient  Carmelia Roller.,  CCC-SLP [place] 05/28/2015, 7:57 PM

## 2015-05-28 NOTE — Progress Notes (Signed)
Social Work Assessment and Plan Social Work Assessment and Plan  Patient Details  Name: Lucas Morgan MRN: [id number] Date of Birth: 09-21-1940  Today's Date: 05/28/2015  Problem List:  Patient Active Problem List   Diagnosis Date Noted  . Aphasia S/P CVA 05/28/2015  . Right hemiparesis 05/28/2015  . Stroke, acute, embolic 05/27/2015  . Carotid stenosis   . Stroke 05/24/2015  . 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 Acute on chronic renal failure 05/24/2015  . CVA (cerebral infarction) 05/24/2015  . Leukocytosis 05/24/2015  . Foreign body in colon 04/08/2015  . Spinal stenosis of lumbar region 10/18/2014  . Lumbar stenosis with neurogenic claudication 10/18/2014  . Thyroid nodule 03/12/2013  . AAA (abdominal aortic aneurysm) 03/12/2013  . PULMONARY NODULE 08/14/2010  . COUGH 06/30/2010  . HYPOTENSION 05/04/2010  . AODM 01/27/2009  . HYPERCHOLESTEROLEMIA  IIA 01/27/2009  . CAD, NATIVE VESSEL 12/26/2008   Past Medical History:  Past Medical History  Diagnosis Date  . Diabetes mellitus   . Dyslipidemia   . MI (myocardial infarction) 11/09/2008    2.5 x 23 Xience V DES to the CFX  . AAA (abdominal aortic aneurysm)     a. Abd U/S 7/14: mild aneurysmal dilatation 3x3 cm; cholelithiasis without evid of cholecystitis => repeat 1 year  . Coronary artery disease   . Arthritis     stenosis, lumbar region  . Renal insufficiency   . Stroke    Past Surgical History:  Past Surgical History  Procedure Laterality Date  . Knee surgery    . Coronary stent placement    . Back surgery  2015    lumbar fusion  . Joint replacement Left   . 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 Lumbar laminectomy/decompression microdiscectomy N/A 10/18/2014    Procedure: LUMBAR LAMINECTOMY/DECOMPRESSION MICRODISCECTOMYLUMBAR THREE-FOUR ;  Surgeon: Temple Pacini, MD;  Location: [facility] NEURO ORS;  Service: Neurosurgery;  Laterality: N/A;  . Coronary angioplasty    . Ep  implantable device N/A 05/26/2015    Procedure: Loop Recorder Insertion;  Surgeon: Marinus Maw, MD;  Location: [facility] INVASIVE CV LAB;  Service: Cardiovascular;  Laterality: N/A;  . Tee without cardioversion N/A 05/26/2015    Procedure: TRANSESOPHAGEAL ECHOCARDIOGRAM (TEE);  Surgeon: Vesta Mixer, MD;  Location: [facility] ENDOSCOPY;  Service: Cardiovascular;  Laterality: N/A;   Social History:  reports that he quit smoking about 21 years ago. He has never used smokeless tobacco. He reports that he does not drink alcohol or use illicit drugs.  Family / Support Systems Marital Status: Widow/Widower Patient Roles: Parent Children: Eunice Blase [phone]  [phone] Other Supports: Meyer Russel  [phone]-cell   Johnny-son  [phone]-home Anticipated Caregiver: family members Ability/Limitations of Caregiver: Daughter's both work but will make arrangements for 24 hr supervision at home Caregiver Availability: 24/7 Family Dynamics: Close knit family three children who are supportive and involved, [name] lives with him., but works during the day. Family is aware of the recommendation of 24 hr care due to safety issues from inability to speak from his stroke.  Social History Preferred language: English Religion: None Cultural Background: No issues Education: High School Read: Yes Write: Yes Employment Status: Retired Fish farm manager Issues: No issues Guardian/Conservator: None-according to MD pt is capable of making his own decisions while here, but will make sure a child is here also if any need to be made   Abuse/Neglect Physical Abuse:  Denies Verbal Abuse: Denies Sexual Abuse: Denies Exploitation of patient/patient's resources: Denies Self-Neglect: Denies  Emotional Status Pt's affect, behavior adn adjustment status: Pt is motivated to recover he has always been independent and is participating in therapies and cooperative. His daughter reports he has  always been independent and done for others.  She feels he will do whatever is needed to improve. Recent Psychosocial Issues: Other health issues-recently discharged from [facility] three days prior to this admission Pyschiatric History: Daughter reports no history but wonders if he is having some depression now can not talk. Will monitor through team and reassess when able to communicate. Can have neuro-psych see when appropriate Substance Abuse History: No issues  Patient / Family Perceptions, Expectations & Goals Pt/Family understanding of illness & functional limitations: Daughter is able to explain Dad's stroke and deficits.  She has been here and talked with his MD's and feels her questions and concerns have been addressed. She is hopeful he will do well here. Premorbid pt/family roles/activities: Father, grandfather, church member, home owner, etc Anticipated changes in roles/activities/participation: resume Pt/family expectations/goals: Eunice Blase states: " We will do whatever he needs since he would for [place]." Pt is not able to participate due to his speech deficits from his stroke.  Community [employer] Agencies: None Premorbid Home Care/DME Agencies: Other (Comment) ([facility] set up from other hospital) Transportation available at discharge: Family Resource referrals recommended: Support group (specify), Neuropsychology  Discharge Planning Living Arrangements: Children Support Systems: Children, Other relatives, Friends/neighbors, Psychologist, clinical community Type of Residence: Private residence Insurance Resources: Media planner (specify) Investment banker, operational) Financial Resources: Restaurant manager, fast food Screen Referred: No Living Expenses: Own Money Management: Patient Does the patient have any problems obtaining your medications?: No Home Management: Pateint and daughter Patient/Family Preliminary Plans: Return home with daughter the family plans to arrange 24 hr care, they work during  the day, but are aware he will need someone with him at discharge from the hospital. Children plan to be here daily but may be later in the evenings due to working. Social Work Anticipated Follow Up Needs: HH/OP, Support Group  Clinical Impression Pleasant cooperative gentleman who is participating in therapies even though he is unable to communicate due to stroke. He is easily directed and appears to know the reason he is here on rehab. His family is supportive and involved and planning to arrange 24 hr care at discharge for pt's safety. Awaiting team's evaluations and will set up a safe discharge plan.  Lucy Chris 05/28/2015, 3:52 PM

## 2015-05-28 NOTE — Progress Notes (Signed)
Patient information reviewed and entered into eRehab System by Becky Koehn Salehi, covering PPS coordinator. Information including medical coding and functional independence measure will be reviewed and updated through discharge.  Per nursing, patient was given "Data Collection Information Summary for Patients in Inpatient Rehabilitation Facilities with attached Privacy Act Statement Health Care Records" upon admission.     

## 2015-05-29 ENCOUNTER — Inpatient Hospital Stay (HOSPITAL_COMMUNITY): Payer: Medicare Other | Admitting: Speech Pathology

## 2015-05-29 ENCOUNTER — Inpatient Hospital Stay (HOSPITAL_COMMUNITY): Payer: Medicare Other | Admitting: Physical Therapy

## 2015-05-29 ENCOUNTER — Inpatient Hospital Stay (HOSPITAL_COMMUNITY): Payer: Medicare Other

## 2015-05-29 DIAGNOSIS — I6789 Other cerebrovascular disease: Secondary | ICD-10-CM

## 2015-05-29 DIAGNOSIS — I6522 Occlusion and stenosis of left carotid artery: Secondary | ICD-10-CM

## 2015-05-29 DIAGNOSIS — I429 Cardiomyopathy, unspecified: Secondary | ICD-10-CM | POA: Insufficient documentation

## 2015-05-29 DIAGNOSIS — I69920 Aphasia following unspecified cerebrovascular disease: Secondary | ICD-10-CM

## 2015-05-29 LAB — BASIC METABOLIC PANEL
ANION GAP: 10 (ref 5–15)
BUN: 22 mg/dL — AB (ref 6–20)
CALCIUM: 9 mg/dL (ref 8.9–10.3)
CO2: 26 mmol/L (ref 22–32)
Chloride: 98 mmol/L — ABNORMAL LOW (ref 101–111)
Creatinine, Ser: 1.33 mg/dL — ABNORMAL HIGH (ref 0.61–1.24)
GFR calc Af Amer: 60 mL/min — ABNORMAL LOW (ref >60)
GFR calc non Af Amer: 51 mL/min — ABNORMAL LOW (ref >60)
GLUCOSE: 250 mg/dL — AB (ref 65–99)
Potassium: 3.9 mmol/L (ref 3.5–5.1)
Sodium: 134 mmol/L — ABNORMAL LOW (ref 135–145)

## 2015-05-29 LAB — GLUCOSE, CAPILLARY
GLUCOSE-CAPILLARY: 155 mg/dL — AB (ref 65–99)
GLUCOSE-CAPILLARY: 256 mg/dL — AB (ref 65–99)
Glucose-Capillary: 169 mg/dL — ABNORMAL HIGH (ref 65–99)
Glucose-Capillary: 151 mg/dL — ABNORMAL HIGH (ref 65–99)

## 2015-05-29 MED ORDER — INSULIN GLARGINE 100 UNIT/ML ~~LOC~~ SOLN
45.0000 [IU] | Freq: Every day | SUBCUTANEOUS | Status: DC
Start: 2015-05-29 — End: 2015-06-06
  Administered 2015-05-29 – 2015-06-05 (×8): 45 [IU] via SUBCUTANEOUS
  Filled 2015-05-29 (×8): qty 0.45

## 2015-05-29 MED ORDER — CLOPIDOGREL BISULFATE 75 MG PO TABS
75.0000 mg | ORAL_TABLET | Freq: Every day | ORAL | Status: DC
  Administered 2015-05-29 – 2015-06-11 (×14): 75 mg via ORAL
  Filled 2015-05-29 (×14): qty 1

## 2015-05-29 NOTE — Progress Notes (Signed)
Speech Language Pathology Daily Session Note  Patient Details  Name: Lucas Morgan MRN: [id number] Date of Birth: Dec 26, 1940  Today's Date: 05/29/2015 SLP Individual Time: 0900-1000 SLP Individual Time Calculation (min): 60 min  Short Term Goals: Week 1: SLP Short Term Goal 1 (Week 1): Pt will follow 2-3 step directions with Sueprvision level verbal cues SLP Short Term Goal 2 (Week 1): Pt will utilize pictures and gestures as non-verbal means of communicating basic needs and wants with Mod assist   SLP Short Term Goal 3 (Week 1): Pt will verbally express phrase length utterances durign structured tasks with Mod semantic cues.  SLP Short Term Goal 4 (Week 1): Pt will demonstrate basic problem solving with Min verbal and visual cues SLP Short Term Goal 5 (Week 1): Pt will attend to right upper extremity and environment durign structured tasks with Min verbal and visual cues SLP Short Term Goal 6 (Week 1): Pt will request help as needed during self-care tasks with Mod question cues  Skilled Therapeutic Interventions:   Skilled treatment session focused on addressing cognitive-linguistic goals. Patient 90% accurate with naming objects with basic CVC construction.  Patient required Max assist multimodal cues for approximations of compound, complex words.  SLP facilitated session by providing Mod assist semantic, phonemic and visual cues for production of phrases that described function of objects.  Patient also able to follow 2 step commands with Min cues, repeats assist with achieving accuracy.  Patient required Mod assist verbal and visual cues to attend to right upper extremity during wheelchair mobility tasks to and from room.  Continue with current plan of care.   Function:  Cognition Comprehension Comprehension assist level: Understands basic 90% of the time/cues < 10% of the time  Expression   Expression assist level: Expresses basis less than 25% of the time/requires cueing >75% of the  time.  Social Interaction Social Interaction assist level: Interacts appropriately 75 - 89% of the time - Needs redirection for appropriate language or to initiate interaction.  Problem Solving Problem solving assist level: Solves basic 50 - 74% of the time/requires cueing 25 - 49% of the time  Memory Memory assist level: Recognizes or recalls 50 - 74% of the time/requires cueing 25 - 49% of the time    Pain Pain Assessment Pain Assessment: No/denies pain  Therapy/Group: Individual Therapy  Charlane Ferretti., CCC-SLP [phone]  Albino Bufford 05/29/2015, 4:53 PM

## 2015-05-29 NOTE — Progress Notes (Signed)
Physical Therapy Session Note  Patient Details  Name: Lucas Morgan MRN: [id number] Date of Birth: 17-Mar-1941  Today's Date: 05/29/2015 PT Individual Time: 0830-0900 Treatment Session 2: 1330-1430 PT Individual Time Calculation (min): 30 min Treatment Session 2: 60 min  Short Term Goals: Week 1:  PT Short Term Goal 1 (Week 1): Pt will demonstrate attention to R side during 50% of observed functional tasks without cues PT Short Term Goal 2 (Week 1): Pt will transfer with supervision PT Short Term Goal 3 (Week 1): Pt will amb 200' with supervision and LRAD PT Short Term Goal 4 (Week 1): Pt will demonstrate dynamic standing balance grade of F+  Skilled Therapeutic Interventions/Progress Updates:    Treatment Session 1: Pt received in bed with IV pole attached - agreeable to PT, significant expressive aphasia verbally during session, but head nods/shakes appear accurate. Gait Training - see function tab for details with ambulation and stairs. Pt uses a modifed hand hold assist (L arm around PT's waist) as PT manages IV pole. PT facilitates R hand approximation on stair rail and req verbal cues to slow down for safety on stairs. Neuromuscular Reeducation - PT finishes Berg Balance Test which was started previous day - see flow sheet for data/score. Pt ended in bed, resting, with all needs in reach and bed alarm on.   Treatment Session 2: Pt received napping in bed with alarm on, agreeable to PT. Therapeutic Activity - see function tab for toileting transfer details - pt impulsively stands up without notifying PT. PT has pt fold 5 towels and 5 wash cloths with R hand acting as assist/stabilizer with increase d time. PT places pen in pt's hand and has him practice tracing capital letters A to Z in the alphabet with R hand - significant difficulty noted. Neuromuscular Reeducation: PT places R hand in planted position on low bench (pt in partial stand) and has pt stack and un-stack cups with L hand,  forcing approximation/strengthening. PT instructs pt in modified wall push-ups - pt completes partial ROM single R arm push-ups with PT assisting in approximation and than B hand wall push ups x 10 reps. Pt continues to have fine motor impairment, but is able to use R arm grossly as an assist - pt has distal weakness > proximal weakness. Continue per PT POC.    Therapy Documentation Precautions:  Precautions Precautions: Fall Precaution Comments: expressive aphasia, impulsive Restrictions Weight Bearing Restrictions: No Vital Signs: AM session after stairs Therapy Vitals Temp: 98 F (36.7 C) Temp Source: Oral Pulse Rate: 93 Resp: 18 BP: 118/76 mmHg Patient Position (if appropriate): Sitting Oxygen Therapy SpO2: 98 % O2 Device: Not Delivered Pain: Pain Assessment Pain Assessment: No/denies pain Treatment Session 2: Pt denies pain.    See Function Navigator for Current Functional Status.   Therapy/Group: Individual Therapy  Eunice Winecoff M 05/29/2015, 8:54 AM

## 2015-05-29 NOTE — IPOC Note (Signed)
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   Aphasia S/P CVA   Right hemiparesis   Cardiomyopathy     Functional Problem List: Nursing Endurance, Medication Management, Motor, Pain, Perception, Safety  PT Balance, Endurance, Motor, Pain, Safety  OT Balance, Behavior, Cognition, Endurance, Motor, Sensory, Safety, Perception, Vision  SLP Cognition, Linguistic, Safety  TR         Basic ADL's: OT Eating, Grooming, Bathing, Toileting, Dressing     Advanced  ADL's: OT Simple Meal Preparation     Transfers: PT Furniture, Car, Bed to Chair, Cablevision Systems, Tub/Shower     Locomotion: PT Ambulation, Stairs     Additional Impairments: OT Fuctional Use of Upper Extremity  SLP Communication, Social Cognition comprehension, expression Problem Solving, Memory, Attention, Awareness  TR      Anticipated Outcomes Item Anticipated Outcome  Self Feeding supervision  Swallowing      Basic self-care  supervision  Toileting  supervision   Bathroom Transfers supervision  Bowel/Bladder  manage bowel and bladder minimal assist  Transfers  supervision  Locomotion  supervision  Communication  Mod assist  Cognition  Supervision with basic  Pain  3 or less  Safety/Judgment  minimal assist   Therapy Plan: PT Intensity: Minimum of 1-2 x/day ,45 to 90 minutes PT Frequency: 5 out of 7 days PT Duration Estimated Length of Stay: 14-17 days OT Intensity: Minimum of 1-2 x/day, 45 to 90 minutes OT Frequency: 5 out of 7 days OT Duration/Estimated Length of Stay: 14-17 days SLP Intensity: Minumum of 1-2 x/day, 30 to 90 minutes SLP Frequency: 3 to 5 out of 7 days SLP Duration/Estimated Length of Stay: 14-17 days       Team Interventions: Nursing Interventions Patient/Family  Education, Disease Management/Prevention, Medication Management, Cognitive Remediation/Compensation  PT interventions Ambulation/gait training, Warden/ranger, Discharge planning, Community reintegration, DME/adaptive equipment instruction, Functional mobility training, Neuromuscular re-education, Patient/family education, Splinting/orthotics, Museum/gallery curator, Therapeutic Activities, Therapeutic Exercise, UE/LE Strength taining/ROM, Visual/perceptual remediation/compensation, UE/LE Coordination activities, Wheelchair propulsion/positioning  OT Interventions Warden/ranger, Firefighter, Chief of Staff, Equities trader education, Self Care/advanced ADL retraining, Therapeutic Exercise, UE/LE Coordination activities, Wheelchair propulsion/positioning, Cognitive remediation/compensation, Discharge planning, DME/adaptive equipment instruction, Functional mobility training, Psychosocial support, Therapeutic Activities, UE/LE Strength taining/ROM, Visual/perceptual remediation/compensation  SLP Interventions Cognitive remediation/compensation, Financial trader, Environmental controls, Functional tasks, Patient/family education, Speech/Language facilitation, Therapeutic Activities  TR Interventions    SW/CM Interventions Discharge Planning, Psychosocial Support, Patient/Family Education    Team Discharge Planning: Destination: PT-Home ,OT- Home , SLP-Home Projected Follow-up: PT-Home health PT, 24 hour supervision/assistance, OT-  Home health OT, 24 hour supervision/assistance, SLP-Outpatient SLP, 24 hour supervision/assistance Projected Equipment Needs: PT-To be determined, OT- None recommended by OT, SLP-None recommended by SLP Equipment Details: PT-none anticipated at this time, OT-pt has all needed equipment Patient/family involved in discharge planning: PT- Patient,  OT-Patient, Family member/caregiver, SLP-Patient  MD ELOS: 11-15d Medical Rehab Prognosis:   Good Assessment: 74 y.o. RH-male with history of DM type 2, CAD, DDD with neurogenic claudication, CVA with aphasia and right sided weakness which had resolved after 3 days and he was discharged from [facility] on 05/23/15. He was readmitted on 06/10/15 with pronounced right sided weakness and garbled speech. He was noted ot be dehydrated with BUN/Cr- 37/1.87 and was started on IVF for hydration. CT  head revealed well defined area of cytotoxic edema left superior frontal cortex and subcortical white matter consistent with late acute cerebral infarction.  MRI/MRA brain done revealing acute/subacute hon-hemorrhagic infarct left frontal and additional areas left parietal and left occipital (question hypoperfusion), right parietal and right occipital lobes with question of embolic source. Age advanced atrophy with moderate distal SVD most evident in R-MCA branches. Carotid dopplers with 1- 39% ICA stenosis bilaterally   Now requiring 24/7 Rehab RN,MD, as well as CIR level PT, OT and SLP.  Treatment team will focus on ADLs and mobility with goals set at Supervision  See Team Conference Notes for weekly updates to the plan of care

## 2015-05-29 NOTE — Consult Note (Signed)
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 Consult is from rehabilitation.  HPI: Lucas Morgan is a 74 y.o. male who had a left brain stroke associated with significant right upper extremity weakness and expressive aphasia. Duplex scan showed a 60-79% left carotid stenosis. 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  Because of his expressive aphasia, I am unable to obtain history from the patient. I have reviewed the records from his original admission on 05/25/2015. He presented with expressive aphasia and right upper extremity paralysis. 4 days prior to this admission he apparently had been admitted in [place] with right-sided weakness and expressive aphasia. He was discharged from the hospital the day before he was admitted here when he presented with worsening symptoms.  Past Medical History  Diagnosis Date  . Diabetes mellitus   . Dyslipidemia   . MI (myocardial infarction) 11/09/2008    2.5 x 23 Xience V DES to the CFX  . AAA (abdominal aortic aneurysm)     a. Abd U/S 7/14: mild aneurysmal dilatation 3x3 cm; cholelithiasis without evid of cholecystitis => repeat 1 year  . Coronary artery disease   . Arthritis     stenosis, lumbar region  . Renal insufficiency   . Stroke     Family History  Problem Relation Age of Onset  . Heart attack Brother     x2 brothers  . CAD Brother     SOCIAL HISTORY: Social History  Substance Use Topics  . Smoking status: Former Smoker    Quit date: 09/13/1993  . Smokeless tobacco: Never Used  . Alcohol Use: No    Allergies  Allergen Reactions  . Trazodone And Nefazodone Other (See Comments)    High blood sugar    Current Facility-Administered Medications  Medication Dose  Route Frequency Provider Last Rate Last Dose  . 0.9 %  sodium chloride infusion   Intravenous Continuous Jacquelynn Cree, PA-C 50 mL/hr at 05/28/15 1207    . acetaminophen (TYLENOL) tablet 325-650 mg  325-650 mg Oral Q4H PRN Jacquelynn Cree, PA-C      . ALPRAZolam Prudy Feeler) tablet 0.5 mg  0.5 mg Oral Q0600 Evlyn Kanner Love, PA-C   0.5 mg at 05/29/15 4098  . ALPRAZolam Prudy Feeler) tablet 0.5-1 mg  0.5-1 mg Oral QHS Evlyn Kanner Love, PA-C   1 mg at 05/28/15 2232  . alum & mag hydroxide-simeth (MAALOX/MYLANTA) 200-200-20 MG/5ML suspension 30 mL  30 mL Oral Q4H PRN Jacquelynn Cree, PA-C      . aspirin EC tablet 325 mg  325 mg Oral Daily Jacquelynn Cree, PA-C   325 mg at 05/29/15 1191  . bisacodyl (DULCOLAX) suppository 10 mg  10 mg Rectal Daily PRN Jacquelynn Cree, PA-C   10 mg at 05/28/15 0301  . clopidogrel (PLAVIX) tablet 75 mg  75 mg Oral Daily Pamela S Love, PA-C      . enoxaparin (LOVENOX) injection 40 mg  40 mg Subcutaneous Q24H Pamela S Love, PA-C   40 mg at 05/28/15 2034  . guaiFENesin-dextromethorphan (ROBITUSSIN DM) 100-10 MG/5ML syrup 5-10 mL  5-10 mL Oral Q6H PRN Jacquelynn Cree, PA-C      . HYDROcodone-acetaminophen (NORCO) 10-325 MG per tablet 1 tablet  1 tablet Oral QID PRN Jacquelynn Cree, PA-C  1 tablet at 05/29/15 0821  . insulin aspart (novoLOG) injection 0-15 Units  0-15 Units Subcutaneous TID WC Jacquelynn Cree, PA-C   3 Units at 05/29/15 4098  . insulin aspart (novoLOG) injection 0-5 Units  0-5 Units Subcutaneous QHS Jacquelynn Cree, PA-C   4 Units at 05/27/15 2110  . insulin aspart (novoLOG) injection 3 Units  3 Units Subcutaneous TID WC Jacquelynn Cree, PA-C   3 Units at 05/29/15 1191  . insulin glargine (LANTUS) injection 45 Units  45 Units Subcutaneous QHS Erick Colace, MD      . methocarbamol (ROBAXIN) tablet 500 mg  500 mg Oral Q6H PRN Jacquelynn Cree, PA-C   500 mg at 05/27/15 2344  . prochlorperazine (COMPAZINE) tablet 5-10 mg  5-10 mg Oral Q6H PRN Jacquelynn Cree, PA-C   10 mg at 05/28/15 0301   Or    . prochlorperazine (COMPAZINE) injection 5-10 mg  5-10 mg Intramuscular Q6H PRN Jacquelynn Cree, PA-C       Or  . prochlorperazine (COMPAZINE) suppository 12.5 mg  12.5 mg Rectal Q6H PRN Jacquelynn Cree, PA-C      . senna-docusate (Senokot-S) tablet 1 tablet  1 tablet Oral QHS PRN Jacquelynn Cree, PA-C      . simvastatin (ZOCOR) tablet 40 mg  40 mg Oral QHS Evlyn Kanner Love, PA-C   40 mg at 05/28/15 2231  . sodium phosphate (FLEET) 7-19 GM/118ML enema 1 enema  1 enema Rectal Once PRN Jacquelynn Cree, PA-C      . traMADol Janean Sark) tablet 50 mg  50 mg Oral Q6H PRN Jacquelynn Cree, PA-C   50 mg at 05/27/15 2344    REVIEW OF SYSTEMS: Because of his expressive aphasia I am unable to obtain a review of systems.  PHYSICAL EXAM: Filed Vitals:   05/28/15 0500 05/28/15 1533 05/29/15 0515 05/29/15 0853  BP: 124/55 100/59 114/59 118/76  Pulse: 82 50 93 93  Temp: 98.5 F (36.9 C) 97.5 F (36.4 C) 98 F (36.7 C)   TempSrc: Oral Oral Oral   Resp: 20 19 18    Height:      Weight:      SpO2: 96% 94% 99% 98%   Body mass index is 25.53 kg/(m^2). GENERAL: The patient is a well-nourished male, in no acute distress. The vital signs are documented above. CARDIAC: There is a regular rate and rhythm.  VASCULAR: I do not detect carotid bruits. He has palpable femoral, popliteal, and posterior tibial pulses bilaterally. He has no significant lower extremity swelling. PULMONARY: There is good air exchange bilaterally without wheezing or rales. ABDOMEN: Soft and non-tender with normal pitched bowel sounds. I do not appreciate an abdominal aortic aneurysm. MUSCULOSKELETAL: There are no major deformities. NEUROLOGIC: On the right side, he has 3 out of 5 grip strength, 4 or 5 biceps, 4-5 triceps, he has no significant weakness in the left upper extremity. He has good strength in both lower extremities. SKIN: There are no ulcers or rashes noted. PSYCHIATRIC: The patient has a normal affect.  DATA:  Lab Results   Component Value Date   WBC 8.3 05/27/2015   HGB 15.9 05/27/2015   HCT 44.9 05/27/2015   MCV 89.1 05/27/2015   PLT 189 05/27/2015   Lab Results  Component Value Date   NA 134* 05/29/2015   K 3.9 05/29/2015   CL 98* 05/29/2015   CO2 26 05/29/2015   Lab Results  Component Value Date   CREATININE  1.33* 05/29/2015   Lab Results  Component Value Date   INR 1.09 05/24/2015   INR 0.98 12/22/2009   INR 0.93 10/12/2009   Lab Results  Component Value Date   HGBA1C 7.5* 05/25/2015   CBG (last 3)   Recent Labs  05/28/15 1642 05/28/15 2040 05/29/15 0621  GLUCAP 206* 192* 169*     CT ANGIO NECK: although the report suggests a left carotid dissection, this did not appear to me to be a clear dissection. There may perhaps be some thrombus associated with the 60% stenosis in the left carotid artery. I have reviewed the films with radiology who agrees that this may not be a dissection.  MRI OF THE BRAIN: MRI of the brain on 05/25/2015 shows an acute/subacute nonhemorrhagic infarct involving the left frontal lobe to the level of the precentral gyrus. In addition there were some smaller non-confluent punctate areas of infarction within the anterior left frontal lobe, left parietal lobe, and left occipital lobe. Also for punctate foci of acute nonhemorrhagic infarcts were noted in the right parietal and occipital lobes.  TEE: TEE on 05/26/2015 shows systolic function was moderately to severely reduced with an ejection fraction ranging from 30-35%. There is no evidence of vegetation on the aortic valve or on the mitral valve.   MEDICAL ISSUES:  LEFT BRAIN STROKE: The patient has had a significant left brain stroke. He has significant weakness in the right upper extremity and a significant expressive aphasia. He has a moderate 60-79% left carotid stenosis that appears to be surgically accessible. If he makes good improvement with rehabilitation I think he could be considered for left carotid  endarterectomy in the next few weeks. He is on aspirin, Plavix, and a statin.  Waverly Ferrari [facility] [place]: [phone]

## 2015-05-29 NOTE — Progress Notes (Signed)
Contacted by Dr. Roda Shutters regarding CTA results. He recommends VVS consult for input regarding CEA and recommends starting  patient on plavix and ASA X 3 months. Neurology will follow up for repeat. CTA in 3 months. Marland Kitchen

## 2015-05-29 NOTE — Progress Notes (Signed)
STROKE TEAM PROGRESS NOTE   SUBJECTIVE (INTERVAL HISTORY) No family members present. The patient's speech is improving. The patient will be seen by vascular surgery for his left internal carotid artery stenosis.   OBJECTIVE Temp:  [97.5 F (36.4 C)-98 F (36.7 C)] 98 F (36.7 C) (09/15 0515) Pulse Rate:  [50-93] 93 (09/15 0853) Cardiac Rhythm:  [-]  Resp:  [18-19] 18 (09/15 0515) BP: (100-118)/(59-76) 118/76 mmHg (09/15 0853) SpO2:  [94 %-99 %] 98 % (09/15 0853)  CBC:   Recent Labs Lab 05/24/15 1740  05/26/15 1347 05/27/15 0637  WBC 14.1*  < > 11.6* 8.3  NEUTROABS 10.1*  --   --   --   HGB 15.4  < > 16.3 15.9  HCT 43.2  < > 45.7 44.9  MCV 89.3  < > 89.8 89.1  PLT 214  < > 201 189  < > = values in this interval not displayed.  Basic Metabolic Panel:   Recent Labs Lab 05/27/15 0637 05/29/15 0827  NA 134* 134*  K 4.5 3.9  CL 100* 98*  CO2 26 26  GLUCOSE 171* 250*  BUN 19 22*  CREATININE 1.31* 1.33*  CALCIUM 9.1 9.0    Lipid Panel:     Component Value Date/Time   CHOL 119 05/25/2015 0705   TRIG 129 05/25/2015 0705   HDL 33* 05/25/2015 0705   CHOLHDL 3.6 05/25/2015 0705   VLDL 26 05/25/2015 0705   LDLCALC 60 05/25/2015 0705   HgbA1c:  Lab Results  Component Value Date   HGBA1C 7.5* 05/25/2015   Urine Drug Screen: No results found for: LABOPIA, COCAINSCRNUR, LABBENZ, AMPHETMU, THCU, LABBARB    IMAGING  Ct Head Wo Contrast 05/24/2015    Well-defined area of cytotoxic edema affecting the LEFT superior frontal cortex and subcortical white matter consistent with a late acute cerebral infarction. No features to suggest proximal vascular occlusion or hemorrhagic transformation.  Chronic changes as described.    MR MRA head/brain without contrast  05/25/2015 1. Acute/subacute nonhemorrhagic infarct involving the left frontal lobe to level of the precentral gyrus. 2. Additional smaller non confluent punctate areas of infarction within the more anterior left  frontal lobe, the left parietal lobe, and left occipital lobe seen to follow a watershed distribution.This suggests more proximal disease, potentially in the neck, or and episode of hypotension. 3. At least 4 punctate foci of acute nonhemorrhagic infarct are noted within the right parietal and occipital lobe. Given the bilateral distribution, a central embolic source also needs to be considered. 4. Age advanced atrophy and diffuse white matter disease. 5. Moderate right and mild left mid A1 segment stenoses. 6. Moderate distal small vessel disease, most evident within the right MCA branch vessels.  CTA Neck 05/28/2015 5 mm segment of 60% stenosis LEFT internal carotid artery associated with fibromuscular dysplasia versus atherosclerosis, and dissection flap extending to the central lumen. 50% stenosis RIGHT ICA origin. 17 mm LEFT thyroid nodule for which follow-up dedicated thyroid sonogram is recommended on a nonemergent basis.  TEE  Left Ventrical: Moderate LV dysfunction. EF 30-35% Mitral Valve: MILD MR Aortic Valve: normal AV  Tricuspid Valve: trace TR  Pulmonic Valve: normal PV Left Atrium/ Left atrial appendage: no thrombi  Atrial septum: no evidence of PFO or ASD by color Doppler flow or bubble study  Aorta: mild - moderate aortic calcifications  CUS - 1-39% right internal carotid artery stenosis and 60-79% left internal carotid artery stenosis. The right vertebral artery is patent with atypical flow  antegrade flow. The left vertebral artery is patent with antegrade flow. The right subclavian artery is patent with antegrade biphasic flow.  Neurologic Exam  Mental Status: Alert, awake, intermittent crying.  Expressive aphasia, not able to name. However, able to repeat simple sentence in a severe dysarthira pattern. Able to follow simple commands without difficulty. Cranial Nerves: II: Discs not visualized; Visual fields grossly normal, pupils equal, round, reactive to  light and accommodation III,IV, VI: ptosis not present, extra-ocular motions intact bilaterally V,VII: Slight right facial droop, facial light touch sensation normal bilaterally VIII: hearing normal bilaterally IX,X: gag reflex present XI: bilateral shoulder shrug XII: midline tongue extension  Motor: Right : Upper extremity   4/5    Left:     Upper extremity   5/5  Lower extremity   5-/5     Lower extremity   5/5 Tone and bulk:normal tone throughout; no atrophy noted Sensory: Pinprick and light touch intact throughout, bilaterally Cerebellar: Unable to test Gait: Not tested   ASSESSMENT/PLAN Lucas Morgan is a 74 y.o. male with history of diabetes mellitus, hyperlipidemia, coronary artery disease with previous MI, abdominal aortic aneurysm, renal insufficiency, and recent stroke presenting with worsening speech and right hemiparesis. He did not receive IV t-PA due to late presentation.  Strokes: primary left MCA infarct could be due to left ICA stenosis and dissection distal to stenosis. However, there is also right MCA/PCA punctate infarcts, raising concern for cardioembolic infarcts.    Resultant  aphasia was mild right hemiparesis (hand> Leg), both improving  MRI  Primary left MCA and punctate right MCA/PCA  MRA  moderate intracranial stenosis  Carotid Doppler left ICA 60-79% stenosis  CTA Neck - 60% stenosis LEFT internal carotid artery associated with fibromuscular dysplasia versus atherosclerosis, and dissection flap extending to the central lumen.  TEE no source of embolus, no PFO, but EF 30-35% down from 05/2014 50-55% EF.  Loop recorder placed 05/26/15 by Dr. Ladona Ridgel  LDL 60  HgbA1c 7.5  VTE prophylaxis - SCDs Diet Carb Modified Fluid consistency:: Thin; Room service appropriate?: Yes  aspirin 81 mg orally every day prior to admission, now on aspirin 325 mg orally every day. Recommend dual antiplatelet for 3 months with ASA 325mg  and plavix 75mg  and then plavix  alone.  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 Not currently on antihypertensives medications.  BP goal normotensive  Avoid hypotension  Hyperlipidemia  Home meds:  Zocor 40 mg daily resumed in hospital  LDL 60, goal < 70  Continue statin at discharge  Diabetes type II  HgbA1c 7.5, goal < 7.0  Uncontrolled  DM education  Other Stroke Risk Factors  Advanced age   Cigarette smoker, quit smoking 21 years ago.  Hx stroke/TIA  Coronary artery disease - prior inferior MI 10/2008 with DES to LCX  Other Active Problems  Acute on chronic kidney disease stage III  Leukocytosis  AAA  Chronic back pain  Hospital day # 2  Neurology will sign off. Please call with questions. Pt will  follow up with [facility] Roda Shutters at [facility] in about 2 months. Thanks for the consult.  Marvel Plan, MD PhD Stroke Neurology 05/29/2015 3:54 PM       To contact Stroke Continuity provider, please refer to [email or web address]. After hours, contact General Neurology

## 2015-05-29 NOTE — Progress Notes (Signed)
Nutrition Brief Note  Patient identified on the Malnutrition Screening Tool (MST) Report  Wt Readings from Last 15 Encounters:  05/27/15 158 lb 1.6 oz (71.714 kg)  05/24/15 168 lb 11.2 oz (76.522 kg)  05/02/15 185 lb (83.915 kg)  04/08/15 177 lb 12.8 oz (80.65 kg)  03/07/15 173 lb 6.4 oz (78.654 kg)  03/03/15 180 lb (81.647 kg)  09/25/14 178 lb (80.74 kg)  09/24/13 174 lb (78.926 kg)  03/12/13 185 lb (83.915 kg)  01/11/13 190 lb (86.183 kg)  04/05/12 196 lb 12.8 oz (89.268 kg)  12/16/10 188 lb (85.276 kg)  08/14/10 190 lb 8 oz (86.41 kg)  06/30/10 188 lb 8 oz (85.503 kg)  05/04/10 192 lb 4 oz (87.204 kg)    Body mass index is 25.53 kg/(m^2). Patient meets criteria for overweight based on current BMI. Pt reports no unintentional weight loss. Question accuracy of most recent weight.   Current diet order is carbohydrate modifed, patient is consuming approximately 100% of meals at this time. Pt reports having a good appetite currently with no other difficulties. Labs and medications reviewed.   No nutrition interventions warranted at this time. If nutrition issues arise, please consult RD.   Roslyn Smiling, MS, RD, LDN Pager # [phone] After hours/ weekend pager # [phone]

## 2015-05-29 NOTE — Progress Notes (Signed)
Subjective/Complaints: Discussed with pt result of CTA, also have discussed with Neuro Pt feels ok today , remains aphasic  ROS difficult to obtain, does appear accurate with Y/N nods, Nods  no to SOB, Abd pain  Objective: Vital Signs: Blood pressure 114/59, pulse 93, temperature 98 F (36.7 C), temperature source Oral, resp. rate 18, height 5\' 6"  (1.676 m), weight 71.714 kg (158 lb 1.6 oz), SpO2 99 %. 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  EXAM: CT ANGIOGRAPHY NECK  TECHNIQUE: Multidetector CT imaging of the neck was performed using the standard protocol during bolus administration of intravenous contrast. Multiplanar CT image reconstructions and MIPs were obtained to evaluate the vascular anatomy. Carotid stenosis measurements (when applicable) are obtained utilizing NASCET criteria, using the distal internal carotid diameter as the denominator.  CONTRAST:  50mL OMNIPAQUE IOHEXOL 350 MG/ML SOLN  COMPARISON:  MRI of the brain May 25, 2015  FINDINGS: Normal appearance of the thoracic arch, normal branch pattern, mild calcific atherosclerosis. The origins of the innominate, left Common carotid artery and subclavian artery are widely patent.  Bilateral Common carotid arteries are widely patent, coursing in a straight line fashion. Mild intimal thickening of the distal Common carotid arteries. Calcific atherosclerosis of bilateral carotid bulbs. 15 mm segment of 50% stenosis RIGHT ICA origin. 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 No pseudoaneurysm. Normal appearance of the included internal carotid arteries.  Left vertebral artery is dominant. Normal appearance of the vertebral arteries, which appear widely patent. RIGHT vertebral artery  predominately terminates in the posterior inferior cerebellar artery.  No contrast extravasation.  17 mm LEFT thyroid nodule. LEFT parotid sialolith. Subcentimeter radiopaque foreign body within the RIGHT superficial neck subcutaneous fat may represent catheter fragment or other non metallic object. No acute osseous process though bone windows have not been submitted ; severe RIGHT C5-6 neural foraminal narrowing. Mild bronchial wall thickening and patchy upper lobe airspace opacities which could represent atelectasis or infiltrate, biapical bullous changes.  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  50% stenosis RIGHT ICA origin.  17 mm LEFT thyroid nodule for which follow-up dedicated thyroid sonogram is recommended on a nonemergent basis.   Electronically Signed   By: Awilda Metro M.D.   On: 05/28/2015 22:33   Results for orders placed or performed during the hospital encounter of 05/27/15 (from the past 72 hour(s))  Glucose, capillary     Status: Abnormal   Collection Time: 05/27/15  9:03 PM  Result Value Ref Range   Glucose-Capillary 341 (H) 65 - 99 mg/dL  Glucose, capillary     Status: Abnormal   Collection Time: 05/28/15  6:28 AM  Result Value Ref Range   Glucose-Capillary 173 (H) 65 - 99 mg/dL  Glucose, capillary     Status: Abnormal   Collection Time: 05/28/15 11:37 AM  Result Value Ref Range   Glucose-Capillary 223 (H) 65 - 99 mg/dL   Comment 1 Notify RN   Glucose, capillary     Status: Abnormal   Collection Time: 05/28/15  4:42 PM  Result Value Ref Range   Glucose-Capillary 206 (H) 65 - 99 mg/dL   Comment 1 Notify RN  Glucose, capillary     Status: Abnormal   Collection Time: 05/28/15  8:40 PM  Result Value Ref Range   Glucose-Capillary 192 (H) 65 - 99 mg/dL  Glucose, capillary     Status: Abnormal   Collection Time: 05/29/15  6:21  AM  Result Value Ref Range   Glucose-Capillary 169 (H) 65 - 99 mg/dL     HEENT: Left pupil opacified with reduced (?absent) vision Cardio: RRR and no murmur Resp: CTA B/L and unlabored GI: BS positive and no bowel sounds Extremity:  No Edema Skin:   Other loop recorder site with eccymosis and tenderness Neuro: Lethargic, Cranial Nerve Abnormalities Right central 7, Abnormal Sensory withdraws to pinch on right but unable to assess due to aphasia, Abnormal Motor 3- Right delt, bi, tri, 2- R WE, FE, 3- R HF, KE 2- ankle, Dysarthric and Aphasic Musc/Skel:  No pain with LE ROM Gen NAD   Assessment/Plan: 1. Functional deficits secondary to Bilateral ACA/PCA infarct which require 3+ hours per day of interdisciplinary therapy in a comprehensive inpatient rehab setting. Physiatrist is providing close team supervision and 24 hour management of active medical problems listed below. Physiatrist and rehab team continue to assess barriers to discharge/monitor patient progress toward functional and medical goals. FIM: Function - Bathing Position: Shower Body parts bathed by patient: Chest, Abdomen, Front perineal area, Buttocks, Right upper leg, Left upper leg Body parts bathed by helper: Right arm, Left arm, Right lower leg, Left lower leg, Back Assist Level: Supervision or verbal cues (mod A)  Function- Upper Body Dressing/Undressing What is the patient wearing?: Pull over shirt/dress Pull over shirt/dress - Perfomed by patient: Thread/unthread left sleeve, Put head through opening Pull over shirt/dress - Perfomed by helper: Pull shirt over trunk, Thread/unthread right sleeve Function - Lower Body Dressing/Undressing What is the patient wearing?: Underwear, Pants, Socks, Shoes Position: Other (comment) (sitting on BSC) Underwear - Performed by patient: Thread/unthread right underwear leg, Thread/unthread left underwear leg Underwear - Performed by helper: Pull underwear up/down Pants-  Performed by patient: Thread/unthread left pants leg Pants- Performed by helper: Thread/unthread right pants leg, Pull pants up/down Socks - Performed by helper: Don/doff right sock, Don/doff left sock Shoes - Performed by helper: Don/doff right shoe, Don/doff left shoe Assist Level: Supervision or verbal cues (max LB)  Function - Toileting Toileting steps completed by patient: Adjust clothing prior to toileting, Performs perineal hygiene, Adjust clothing after toileting Toileting steps completed by helper: Adjust clothing after toileting Toileting Assistive Devices: Grab bar or rail Assist level: Supervision or verbal cues  Function Programmer, multimedia transfer assistive device: Hospital doctor level to toilet: Touching or steadying assistance (Pt > 75%) Assist level from toilet: Touching or steadying assistance (Pt > 75%)  Function - Chair/bed transfer Chair/bed transfer method: Ambulatory Chair/bed transfer assist level: Touching or steadying assistance (Pt > 75%) Chair/bed transfer details: Verbal cues for precautions/safety, Visual cues/gestures for precautions/safety, Verbal cues for sequencing, Visual cues/gestures for sequencing  Function - Locomotion: Wheelchair Will patient use wheelchair at discharge?: No Function - Locomotion: Ambulation Assistive device: Hand held assist Max distance: 200 Assist level: Touching or steadying assistance (Pt > 75%) Assist level: Touching or steadying assistance (Pt > 75%) Assist level: Touching or steadying assistance (Pt > 75%) Assist level: Touching or steadying assistance (Pt > 75%) Assist level: Touching or steadying assistance (Pt > 75%)  Function - Comprehension Comprehension: Auditory Comprehension assist level: Understands basic 75 - 89% of the time/ requires cueing 10 - 24%  of the time  Function - Expression Expression: Verbal Expression assist level: Expresses basis less than 25% of the time/requires cueing >75% of the  time.  Function - Social Interaction Social Interaction assist level: Interacts appropriately 50 - 74% of the time - May be physically or verbally inappropriate.  Function - Problem Solving Problem solving assist level: Solves basic 25 - 49% of the time - needs direction more than half the time to initiate, plan or complete simple activities  Function - Memory Memory assist level: Recognizes or recalls 25 - 49% of the time/requires cueing 50 - 75% of the time Patient normally able to recall (first 3 days only): That he or she is in a hospital, Current season  Medical Problem List and Plan: 1. 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  DVT Prophylaxis/Anticoagulation: Pharmaceutical: Lovenox 3. Chronic pain/Pain Management: Continue hydrocodone prn for OA/DDD.    4. Adjustment reaction/ Mood: Team to provide ego support.  LCSW to follow for evaluation and support. Continue Xanax bid.  appears drowsy this am will reduce dose 5. Neuropsych: This patient is capable of making decisions on his own behalf. 6. Skin/Wound Care: routine pressure relief measures 7. Fluids/Electrolytes/Nutrition: Monitor I/O. Check lytes in am. Push po fluids may need nocturnal IVF 8. Acute Renal insufficiency: likely due to poor intake and hypotension.  Continue to hold lisinopril and avoid hypotension. Will monitor labs for trends 9.  CAD/AAA:  Stable on ASA and zocor.   10. DM type 2: Will monitor BS ac/hs. Was on lantus 75 and glipizide at home.  Will increase lantus to 45 units and add meal coverage for now.  Titrate towards home dose as intake improves. Use SSI for elevated BS.   11. HTN:  Blood pressures are still on low side (114/59). Continue to hold lisinopril and avoid hypotension/hypoperfusion. Offer fluids between meals.     12. Insomnia: should improve with increased activity  LOS  (Days) 2 A FACE TO FACE EVALUATION WAS PERFORMED  Lucas Morgan,Lucas Morgan 05/29/2015, 7:42 AM

## 2015-05-29 NOTE — Progress Notes (Signed)
Occupational Therapy Session Note  Patient Details  Name: Lucas Morgan MRN: [id number] Date of Birth: Feb 03, 1941  Today's Date: 05/29/2015 OT Individual Time: 1000-1100 OT Individual Time Calculation (min): 60 min    Short Term Goals: Week 1:  OT Short Term Goal 1 (Week 1): Pt will locate items to the right during self care task with min verbal cues. OT Short Term Goal 2 (Week 1): Pt will perform LB dressing with mod A in order to decrease the level of assist needed for self care.  OT Short Term Goal 3 (Week 1): Pt will perform UB dressing with min A in order to decrease the level of assist needed for self care.  OT Short Term Goal 4 (Week 1): Pt will perform shower transfer with min A onto TTB in order to increase I with functional transfers.  Skilled Therapeutic Interventions/Progress Updates: ADL-retraining with focus on improved attention, sequencing, right UE NMR, standing balance (static/dynamic), and adapted bathing/dressing technique.  Pt received in w/c and requesting grooming assistance using gestures and reinforced by staff.  After setup to shave, pt was able to shave his left cheek effectively but demonstrates rapid and reckless use of left hand with poor awareness complicated by visual impairment, therefore shaving completed by therapist.   Pt able to follow all cues presented during bathing/dressing session at w/c level, although his movements consistently lack effective modulation when needed.   Pt able to incorporate right UE during self-care as gross assist with min assist to grasp, place and hold washcloth.   Pt attempts verbalizations during session but communicates using gestures and head nods to dichotomous questions.  Standing balance is good throughout session and patient is able to wash buttocks and peri-area using his left hand while standing unsupported.   Pt returned to supine in bed at end of session as requested by vascular MD present to assess for f/u care.    Therapy  Documentation Precautions:  Precautions Precautions: Fall Precaution Comments: expressive aphasia, impulsive Restrictions Weight Bearing Restrictions: No  Pain: No/denies pain     See Function Navigator for Current Functional Status.   Therapy/Group: Individual Therapy  Naje Rice 05/29/2015, 12:53 PM

## 2015-05-30 ENCOUNTER — Inpatient Hospital Stay (HOSPITAL_COMMUNITY): Payer: Medicare Other | Admitting: Speech Pathology

## 2015-05-30 ENCOUNTER — Inpatient Hospital Stay (HOSPITAL_COMMUNITY): Payer: Medicare Other

## 2015-05-30 ENCOUNTER — Inpatient Hospital Stay (HOSPITAL_COMMUNITY): Payer: Medicare Other | Admitting: Physical Therapy

## 2015-05-30 DIAGNOSIS — K5901 Slow transit constipation: Secondary | ICD-10-CM

## 2015-05-30 LAB — URINALYSIS, ROUTINE W REFLEX MICROSCOPIC
Bilirubin Urine: NEGATIVE
GLUCOSE, UA: 250 mg/dL — AB
HGB URINE DIPSTICK: NEGATIVE
Ketones, ur: NEGATIVE mg/dL
Leukocytes, UA: NEGATIVE
Nitrite: NEGATIVE
Protein, ur: NEGATIVE mg/dL
SPECIFIC GRAVITY, URINE: 1.013 (ref 1.005–1.030)
Urobilinogen, UA: 1 mg/dL (ref 0.0–1.0)
pH: 6 (ref 5.0–8.0)

## 2015-05-30 LAB — BASIC METABOLIC PANEL
Anion gap: 6 (ref 5–15)
BUN: 18 mg/dL (ref 6–20)
CALCIUM: 8.8 mg/dL — AB (ref 8.9–10.3)
CO2: 25 mmol/L (ref 22–32)
CREATININE: 1.09 mg/dL (ref 0.61–1.24)
Chloride: 106 mmol/L (ref 101–111)
GFR calc Af Amer: >60 mL/min (ref >60)
GFR calc non Af Amer: >60 mL/min (ref >60)
GLUCOSE: 114 mg/dL — AB (ref 65–99)
Potassium: 3.9 mmol/L (ref 3.5–5.1)
Sodium: 137 mmol/L (ref 135–145)

## 2015-05-30 LAB — GLUCOSE, CAPILLARY
GLUCOSE-CAPILLARY: 132 mg/dL — AB (ref 65–99)
Glucose-Capillary: 101 mg/dL — ABNORMAL HIGH (ref 65–99)
Glucose-Capillary: 101 mg/dL — ABNORMAL HIGH (ref 65–99)
Glucose-Capillary: 206 mg/dL — ABNORMAL HIGH (ref 65–99)

## 2015-05-30 MED ORDER — SENNOSIDES-DOCUSATE SODIUM 8.6-50 MG PO TABS
1.0000 | ORAL_TABLET | Freq: Two times a day (BID) | ORAL | Status: DC
  Administered 2015-05-30 – 2015-06-11 (×23): 1 via ORAL
  Filled 2015-05-30 (×23): qty 1

## 2015-05-30 MED ORDER — FLEET ENEMA 7-19 GM/118ML RE ENEM
1.0000 | ENEMA | Freq: Once | RECTAL | Status: AC
  Administered 2015-05-30: 1 via RECTAL
  Filled 2015-05-30: qty 1

## 2015-05-30 MED ORDER — GLIPIZIDE 2.5 MG HALF TABLET
2.5000 mg | ORAL_TABLET | Freq: Every day | ORAL | Status: DC
  Administered 2015-05-30 – 2015-06-02 (×4): 2.5 mg via ORAL
  Filled 2015-05-30 (×5): qty 1

## 2015-05-30 NOTE — Progress Notes (Signed)
Occupational Therapy Session Note  Patient Details  Name: Lucas Morgan MRN: [id number] Date of Birth: 1940-09-18  Today's Date: 05/30/2015 OT Individual Time: 1000-1100 OT Individual Time Calculation (min): 60 min    Short Term Goals: Week 1:  OT Short Term Goal 1 (Week 1): Pt will locate items to the right during self care task with min verbal cues. OT Short Term Goal 2 (Week 1): Pt will perform LB dressing with mod A in order to decrease the level of assist needed for self care.  OT Short Term Goal 3 (Week 1): Pt will perform UB dressing with min A in order to decrease the level of assist needed for self care.  OT Short Term Goal 4 (Week 1): Pt will perform shower transfer with min A onto TTB in order to increase I with functional transfers.  Skilled Therapeutic Interventions/Progress Updates: ADL-retraining at shower level with focus on improved attention, safety awareness, and NMR of RUE.   Pt received with RN and RN tech assisting to toilet to collect urine sample.   Pt unable to void urine while standing therefore attempt to collect was terminated.   Pt undressed sitting at toilet with mod vc to attend to right LE and to sequence (to remove shoes before pants).   Pt alert and able to comprehend succinct directions.   Pt ambulates to tub bench with hand held assist and bathes sitting and standing, using RUE at diminished level to wash lower legs.   Pt unable to sustain grasp on washcloth therefore washing with right hand was facilitated with hand-over-hand guidance.   Pt washed approx 25% of body using right hand and 75% with left.   No LOB noted while standing to wash peri-area and buttocks.   Pt returned to EOB to dress seated, standing to pull up his underwear and pants.   Pt dons socks and shoes and tied shoe lace 75% d/t right pinch weakness.   OT provided right dorsal wrist cock-up splint to improve tip and tripod pinch performance.  Pt able to button 3 buttons on shirt with extra time.    Pt returned to w/c at end of session; QRB attached and call light placed within reach.     Therapy Documentation Precautions:  Precautions Precautions: Fall Precaution Comments: expressive aphasia, impulsive Restrictions Weight Bearing Restrictions: No   Pain: Pain Assessment Pain Score: 0-No pain   See Function Navigator for Current Functional Status.   Therapy/Group: Individual Therapy  BARTHOLD,FRANK 05/30/2015, 12:59 PM

## 2015-05-30 NOTE — Progress Notes (Signed)
Physical Therapy Session Note  Patient Details  Name: Lucas Morgan MRN: [id number] Date of Birth: 1941-03-25  Today's Date: 05/30/2015 PT Individual Time: 1330-1445 PT Individual Time Calculation (min): 75 min   Short Term Goals: Week 1:  PT Short Term Goal 1 (Week 1): Pt will demonstrate attention to R side during 50% of observed functional tasks without cues PT Short Term Goal 2 (Week 1): Pt will transfer with supervision PT Short Term Goal 3 (Week 1): Pt will amb 200' with supervision and LRAD PT Short Term Goal 4 (Week 1): Pt will demonstrate dynamic standing balance grade of F+  Skilled Therapeutic Interventions/Progress Updates:    Pt received on toilet with nurse tech and daughter Cecil Lions present. Therapeutic Activity - see function tab for toileting, bed mobility, and transfers details. Daughter [place] Lions trained and signed off to assist pt in toilet transfers and ambulating in room.PT instructs pt in floor transfer from mat on floor req CGA for safety. Gait Training - see function tab for ambulation details, as well as stairs. With fatigue, pt drags R foot slightly more and demonstrates increased variability in base of support. Neuromuscular Reeducation: PT instructs pt in forced use dynamic standing balance activity in placing/removing horseshoes from medium and then tall height of basketball hoop, reaching across body to pass with daughter. Pt drops 3 horseshoes but is able to get them off the floor with CGA for safety. Therapeutic Exercise: PT places pt on nu-step at L3 x 10 minutes total with rest breaks as needed using B UEs and LEs, req repeated cues to slide R hand back up to the top of the handle. Pt demonstrates inattention to R UE during nu-step and stair activity. Pt is progressing with activity tolerance and balance. Cont per PT POC.   Therapy Documentation Precautions:  Precautions Precautions: Fall Precaution Comments: expressive aphasia, impulsive Restrictions Weight Bearing  Restrictions: No Pain: Pain Assessment Pain Assessment: 0-10 Faces Pain Scale: Hurts even more Pain Type: Chronic pain Pain Location: Back Pain Orientation: Lower Pain Descriptors / Indicators: Aching;Sore Pain Onset: Gradual Pain Intervention(s): Medication (See eMAR);Rest Multiple Pain Sites: No   See Function Navigator for Current Functional Status.   Therapy/Group: Individual Therapy  HYSLOP,AMANDA M 05/30/2015, 2:29 PM

## 2015-05-30 NOTE — Progress Notes (Signed)
   VASCULAR SURGERY ASSESSMENT & PLAN:  * I will arrange a f/u office visit in 2 weeks. If he is making significant recovery, we could consider left CEA  * I will check back Monday. If any problems over the weekend, Dr. Myra Gianotti is on call.   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

## 2015-05-30 NOTE — Progress Notes (Signed)
Speech Language Pathology Daily Session Note  Patient Details  Name: Lucas Morgan MRN: [id number] Date of Birth: [birth year]  Today's Date: 05/30/2015 SLP Individual Time: 4098-1191 SLP Individual Time Calculation (min): 30 min and  Today's Date: 05/30/2015 SLP Missed Time: 30 Minutes Missed Time Reason: X-ray  Short Term Goals: Week 1: SLP Short Term Goal 1 (Week 1): Pt will follow 2-3 step directions with Sueprvision level verbal cues SLP Short Term Goal 2 (Week 1): Pt will utilize pictures and gestures as non-verbal means of communicating basic needs and wants with Mod assist   SLP Short Term Goal 3 (Week 1): Pt will verbally express phrase length utterances durign structured tasks with Mod semantic cues.  SLP Short Term Goal 4 (Week 1): Pt will demonstrate basic problem solving with Min verbal and visual cues SLP Short Term Goal 5 (Week 1): Pt will attend to right upper extremity and environment during structured tasks with Min verbal and visual cues SLP Short Term Goal 6 (Week 1): Pt will request help as needed during self-care tasks with Mod question cues  Skilled Therapeutic Interventions: Skilled treatment session focused on addressing cognitive-linguistic goals. SLP facilitated session by providing Max assist semantic and phonemic cues to elicit word-phrase level verbal expression during a picture description task.  SLP models made second attempts for fluent and intelligible, indicating improved repetition abilities.  Patient continues to demonstrate perseveration during spontaneous speech "I I I I I," which really limits his ability to verbally express wants, due to patient attempting phrase-sentence level of expression.  As a result, SLP rehearsed expression of basic needs with one word responses with gestures.  RN educated on cuing technique to facilitate carryover.  Continue with current plan of care.    Function:  Cognition Comprehension Comprehension assist level: Follows  basic conversation/direction with extra time/assistive device  Expression   Expression assist level: Expresses basic 25 - 49% of the time/requires cueing 50 - 75% of the time. Uses single words/gestures.  Social Interaction Social Interaction assist level: Interacts appropriately 75 - 89% of the time - Needs redirection for appropriate language or to initiate interaction.  Problem Solving Problem solving assist level: Solves basic 75 - 89% of the time/requires cueing 10 - 24% of the time  Memory Memory assist level: Recognizes or recalls 75 - 89% of the time/requires cueing 10 - 24% of the time    Pain Pain Assessment Pain Assessment: No/denies pain Faces Pain Scale: Hurts even more Pain Type: Chronic pain Pain Location: Back Pain Descriptors / Indicators: Aching Pain Intervention(s): Medication (See eMAR)  Therapy/Group: Individual Therapy  Charlane Ferretti., CCC-SLP [phone]  [name],[name] 05/30/2015, 2:26 PM

## 2015-05-30 NOTE — Progress Notes (Signed)
Subjective/Complaints: Appreciate Neuro and Vascular surgery notes Per RN pt appears to have abd discomfort, remains aphasic Reviewed CT abd from July 2016 and KUB, calcified gallstone some question of foreign body on CT scan wasn't seen on f/u KUB Working through Y/N questions, pt pointing to abd, nods yes to constipation, points to anus, nods yes to enema ROS difficult to obtain, does appear accurate with Y/N nods, no breathing issues , no Nausea or vomiting  Objective: Vital Signs: Blood pressure 132/71, pulse 83, temperature 97.8 F (36.6 C), temperature source Oral, resp. rate 20, height 5' 6"  (1.676 m), weight 71.714 kg (158 lb 1.6 oz), SpO2 97 %. 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  EXAM: CT ANGIOGRAPHY NECK  TECHNIQUE: Multidetector CT imaging of the neck was performed using the standard protocol during bolus administration of intravenous contrast. Multiplanar CT image reconstructions and MIPs were obtained to evaluate the vascular anatomy. Carotid stenosis measurements (when applicable) are obtained utilizing NASCET criteria, using the distal internal carotid diameter as the denominator.  CONTRAST:  13m OMNIPAQUE IOHEXOL 350 MG/ML SOLN  COMPARISON:  MRI of the brain May 25, 2015  FINDINGS: Normal appearance of the thoracic arch, normal branch pattern, mild calcific atherosclerosis. The origins of the innominate, left Common carotid artery and subclavian artery are widely patent.  Bilateral Common carotid arteries are widely patent, coursing in a straight line fashion. Mild intimal thickening of the distal Common carotid arteries. Calcific atherosclerosis of bilateral carotid bulbs. 15 mm segment of 50% stenosis RIGHT ICA origin. 5 mm segment of 60% stenosis LEFT internal carotid artery 2 cm from the origin with luminal regularity proximal to the stenosis and, dissection flap extending  to the central lumen, sagittal 139/204. No pseudoaneurysm. Normal appearance of the included internal carotid arteries.  Left vertebral artery is dominant. Normal appearance of the vertebral arteries, which appear widely patent. RIGHT vertebral artery predominately terminates in the posterior inferior cerebellar artery.  No contrast extravasation.  17 mm LEFT thyroid nodule. LEFT parotid sialolith. Subcentimeter radiopaque foreign body within the RIGHT superficial neck subcutaneous fat may represent catheter fragment or other non metallic object. No acute osseous process though bone windows have not been submitted ; severe RIGHT C5-6 neural foraminal narrowing. Mild bronchial wall thickening and patchy upper lobe airspace opacities which could represent atelectasis or infiltrate, biapical bullous changes.  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  50% stenosis RIGHT ICA origin.  17 mm LEFT thyroid nodule for which follow-up dedicated thyroid sonogram is recommended on a nonemergent basis.   Electronically Signed   By: CElon AlasM.D.   On: 05/28/2015 22:33   Results for orders placed or performed during the hospital encounter of 05/27/15 (from the past 72 hour(s))  Glucose, capillary     Status: Abnormal   Collection Time: 05/27/15  9:03 PM  Result Value Ref Range   Glucose-Capillary 341 (H) 65 - 99 mg/dL  Glucose, capillary     Status: Abnormal   Collection Time: 05/28/15  6:28 AM  Result Value Ref Range   Glucose-Capillary 173 (H) 65 - 99 mg/dL  Glucose, capillary     Status: Abnormal   Collection Time: 05/28/15 11:37 AM  Result Value Ref Range   Glucose-Capillary 223 (H) 65 - 99 mg/dL   Comment 1 Notify  RN   Glucose, capillary     Status: Abnormal   Collection Time: 05/28/15  4:42 PM  Result Value Ref Range   Glucose-Capillary 206 (H) 65 - 99  mg/dL   Comment 1 Notify RN   Glucose, capillary     Status: Abnormal   Collection Time: 05/28/15  8:40 PM  Result Value Ref Range   Glucose-Capillary 192 (H) 65 - 99 mg/dL  Glucose, capillary     Status: Abnormal   Collection Time: 05/29/15  6:21 AM  Result Value Ref Range   Glucose-Capillary 169 (H) 65 - 99 mg/dL  Basic metabolic panel     Status: Abnormal   Collection Time: 05/29/15  8:27 AM  Result Value Ref Range   Sodium 134 (L) 135 - 145 mmol/L   Potassium 3.9 3.5 - 5.1 mmol/L   Chloride 98 (L) 101 - 111 mmol/L   CO2 26 22 - 32 mmol/L   Glucose, Bld 250 (H) 65 - 99 mg/dL   BUN 22 (H) 6 - 20 mg/dL   Creatinine, Ser 1.33 (H) 0.61 - 1.24 mg/dL   Calcium 9.0 8.9 - 10.3 mg/dL   GFR calc non Af Amer 51 (L) >60 mL/min   GFR calc Af Amer 60 (L) >60 mL/min    Comment: (NOTE) The eGFR has been calculated using the CKD EPI equation. This calculation has not been validated in all clinical situations. eGFR's persistently <60 mL/min signify possible Chronic Kidney Disease.    Anion gap 10 5 - 15  Glucose, capillary     Status: Abnormal   Collection Time: 05/29/15 11:08 AM  Result Value Ref Range   Glucose-Capillary 151 (H) 65 - 99 mg/dL  Glucose, capillary     Status: Abnormal   Collection Time: 05/29/15  4:12 PM  Result Value Ref Range   Glucose-Capillary 155 (H) 65 - 99 mg/dL  Glucose, capillary     Status: Abnormal   Collection Time: 05/29/15  9:49 PM  Result Value Ref Range   Glucose-Capillary 256 (H) 65 - 99 mg/dL     HEENT: Left pupil opacified with reduced (?absent) vision Cardio: RRR and no murmur Resp: CTA B/L and unlabored GI: BS positive and no bowel sounds Extremity:  No Edema Skin:   Other loop recorder site with eccymosis and tenderness Neuro: Lethargic, Cranial Nerve Abnormalities Right central 7, Abnormal Sensory withdraws to pinch on right but unable to assess due to aphasia, Abnormal Motor 3- Right delt, bi, tri, 2- R WE, FE, 3- R HF, KE 2- ankle,  Dysarthric and Aphasic Musc/Skel:  No pain with LE ROM Gen NAD   Assessment/Plan: 1. Functional deficits secondary to Bilateral ACA/PCA infarct which require 3+ hours per day of interdisciplinary therapy in a comprehensive inpatient rehab setting. Physiatrist is providing close team supervision and 24 hour management of active medical problems listed below. Physiatrist and rehab team continue to assess barriers to discharge/monitor patient progress toward functional and medical goals. FIM: Function - Bathing Position: Wheelchair/chair at sink Body parts bathed by patient: Right arm, Chest, Abdomen, Front perineal area, Buttocks, Right upper leg, Left upper leg Body parts bathed by helper: Left lower leg, Right lower leg, Left arm Assist Level: Touching or steadying assistance(Pt > 75%)  Function- Upper Body Dressing/Undressing What is the patient wearing?: Pull over shirt/dress Pull over shirt/dress - Perfomed by patient: Thread/unthread left sleeve, Put head through opening, Pull shirt over trunk Pull over shirt/dress - Perfomed by helper: Thread/unthread right sleeve Assist Level: Touching  or steadying assistance(Pt > 75%) Function - Lower Body Dressing/Undressing What is the patient wearing?: Underwear, Pants, Socks, Shoes Position: Wheelchair/chair at sink Underwear - Performed by patient: Thread/unthread left underwear leg, Pull underwear up/down Underwear - Performed by helper: Thread/unthread right underwear leg Pants- Performed by patient: Thread/unthread left pants leg, Pull pants up/down, Fasten/unfasten pants Pants- Performed by helper: Thread/unthread right pants leg Socks - Performed by patient: Don/doff right sock, Don/doff left sock Socks - Performed by helper: Don/doff right sock, Don/doff left sock Shoes - Performed by patient: Don/doff right shoe, Don/doff left shoe Shoes - Performed by helper: Fasten right, Fasten left Assist Level: Touching or steadying assistance  (Pt > 75%)  Function - Toileting Toileting steps completed by patient: Adjust clothing prior to toileting, Performs perineal hygiene, Adjust clothing after toileting Toileting steps completed by helper: Adjust clothing after toileting Toileting Assistive Devices: Grab bar or rail Assist level: Supervision or verbal cues  Function - Air cabin crew transfer assistive device: Walker Assist level to toilet: Supervision or verbal cues Assist level from toilet: Supervision or verbal cues  Function - Chair/bed transfer Chair/bed transfer method: Ambulatory Chair/bed transfer assist level: Supervision or verbal cues Chair/bed transfer details: Verbal cues for precautions/safety  Function - Locomotion: Wheelchair Will patient use wheelchair at discharge?: No Function - Locomotion: Ambulation Assistive device: Other (comment) (none to HHA) Max distance: 150' x 2 reps Assist level: Touching or steadying assistance (Pt > 75%) Assist level: Touching or steadying assistance (Pt > 75%) Assist level: Touching or steadying assistance (Pt > 75%) Assist level: Touching or steadying assistance (Pt > 75%) Assist level: Touching or steadying assistance (Pt > 75%)  Function - Comprehension Comprehension: Auditory Comprehension assist level: Understands basic 90% of the time/cues < 10% of the time  Function - Expression Expression: Verbal Expression assist level: Expresses basis less than 25% of the time/requires cueing >75% of the time.  Function - Social Interaction Social Interaction assist level: Interacts appropriately 75 - 89% of the time - Needs redirection for appropriate language or to initiate interaction.  Function - Problem Solving Problem solving assist level: Solves basic 50 - 74% of the time/requires cueing 25 - 49% of the time  Function - Memory Memory assist level: Recognizes or recalls 50 - 74% of the time/requires cueing 25 - 49% of the time Patient normally able to  recall (first 3 days only): Current season, That he or she is in a hospital  Medical Problem List and Plan: 1. 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  DVT Prophylaxis/Anticoagulation: Pharmaceutical: Lovenox 3. Chronic pain/Pain Management: Continue hydrocodone prn for OA/DDD.    4. Adjustment reaction/ Mood: Team to provide ego support.  LCSW to follow for evaluation and support. Continue Xanax bid.  Low dose 5. Neuropsych: This patient is capable of making decisions on his own behalf. 6. Skin/Wound Care: routine pressure relief measures 7. Fluids/Electrolytes/Nutrition: Monitor I/O. Check lytes in am. Push po fluids may need nocturnal IVF 8. Acute Renal insufficiency: likely due to poor intake and hypotension.  Continue to hold lisinopril and avoid hypotension. Will monitor labs for trends 9.  CAD/AAA:  Stable on ASA and zocor.   10. DM type 2: Will monitor BS ac/hs. Was on lantus 75 and glipizide at home.  Will cont lantus to 45 units cont meal  coverage for now.  add glipizide low dose since taking 100% meals. Use SSI for elevated BS.   11. HTN:  Blood pressures are still on low side (132/71). Continue to hold lisinopril and avoid hypotension/hypoperfusion. Offer fluids between meals.     12. Insomnia: should improve with increased activity  LOS (Days) 3 A FACE TO FACE EVALUATION WAS PERFORMED  Lucas Morgan,Lucas Morgan 05/30/2015, 6:57 AM

## 2015-05-31 ENCOUNTER — Inpatient Hospital Stay (HOSPITAL_COMMUNITY): Payer: Medicare Other | Admitting: Physical Therapy

## 2015-05-31 ENCOUNTER — Inpatient Hospital Stay (HOSPITAL_COMMUNITY): Payer: Medicare Other | Admitting: Occupational Therapy

## 2015-05-31 ENCOUNTER — Inpatient Hospital Stay (HOSPITAL_COMMUNITY): Payer: Medicare Other | Admitting: Speech Pathology

## 2015-05-31 DIAGNOSIS — I429 Cardiomyopathy, unspecified: Secondary | ICD-10-CM

## 2015-05-31 LAB — BASIC METABOLIC PANEL
ANION GAP: 10 (ref 5–15)
BUN: 21 mg/dL — AB (ref 6–20)
CALCIUM: 9 mg/dL (ref 8.9–10.3)
CO2: 24 mmol/L (ref 22–32)
Chloride: 102 mmol/L (ref 101–111)
Creatinine, Ser: 1.23 mg/dL (ref 0.61–1.24)
GFR calc Af Amer: >60 mL/min (ref >60)
GFR calc non Af Amer: 56 mL/min — ABNORMAL LOW (ref >60)
GLUCOSE: 176 mg/dL — AB (ref 65–99)
POTASSIUM: 4.1 mmol/L (ref 3.5–5.1)
Sodium: 136 mmol/L (ref 135–145)

## 2015-05-31 LAB — GLUCOSE, CAPILLARY
GLUCOSE-CAPILLARY: 188 mg/dL — AB (ref 65–99)
GLUCOSE-CAPILLARY: 119 mg/dL — AB (ref 65–99)
Glucose-Capillary: 160 mg/dL — ABNORMAL HIGH (ref 65–99)
Glucose-Capillary: 146 mg/dL — ABNORMAL HIGH (ref 65–99)

## 2015-05-31 NOTE — Progress Notes (Signed)
Speech Language Pathology Daily Session Note  Patient Details  Name: Lucas Morgan MRN: [id number] Date of Birth: [birth year]  Today's Date: 05/31/2015 SLP Individual Time: 5784-6962 SLP Individual Time Calculation (min): 60 min  Short Term Goals: Week 1: SLP Short Term Goal 1 (Week 1): Pt will follow 2-3 step directions with Sueprvision level verbal cues SLP Short Term Goal 2 (Week 1): Pt will utilize pictures and gestures as non-verbal means of communicating basic needs and wants with Mod assist   SLP Short Term Goal 3 (Week 1): Pt will verbally express phrase length utterances durign structured tasks with Mod semantic cues.  SLP Short Term Goal 4 (Week 1): Pt will demonstrate basic problem solving with Min verbal and visual cues SLP Short Term Goal 5 (Week 1): Pt will attend to right upper extremity and environment during structured tasks with Min verbal and visual cues SLP Short Term Goal 6 (Week 1): Pt will request help as needed during self-care tasks with Mod question cues  Skilled Therapeutic Interventions: Skilled treatment focused on speech and cognitive goals.  SLP facilitated session by providing min-mod A providing sentence starters and fill-in-the-blank responses that corresponded with picture cards along with extra time to address expressive aphasia.   He required min A-Supervision with basic time orientation.  He required min A with immediate memory recall re: patient's therapy schedule.  After 10-15 minutes, patient demonstrated Supervision- Mod Independence with delayed memory recall re: therapy schedule.  Patient required min A sequencing 3 step sequencing picture cards and mod-max A sequencing 4 step sequencing picture cards.  He required max A identifying and correcting his errors when sequencing 4 step sequencing picture cards.  Patient required max A using mental math to determine how much time he had between therapy sessions.   Continue with current plan of care.    Function:   Cognition Comprehension Comprehension assist level: Understands basic 90% of the time/cues < 10% of the time  Expression   Expression assist level: Expresses basic 25 - 49% of the time/requires cueing 50 - 75% of the time. Uses single words/gestures.  Social Interaction Social Interaction assist level: Interacts appropriately 75 - 89% of the time - Needs redirection for appropriate language or to initiate interaction.  Problem Solving Problem solving assist level: Solves basic 50 - 74% of the time/requires cueing 25 - 49% of the time;Solves basic 25 - 49% of the time - needs direction more than half the time to initiate, plan or complete simple activities  Memory Memory assist level: Recognizes or recalls 75 - 89% of the time/requires cueing 10 - 24% of the time    Pain Pain Assessment Pain Assessment: No/denies pain   Therapy/Group: Individual Therapy  Cranford Mon, M.A. CCC-SLP Cranford Mon A 05/31/2015, 12:29 PM

## 2015-05-31 NOTE — Progress Notes (Signed)
Occupational Therapy Session Note  Patient Details  Name: Lucas Morgan MRN: [id number] Date of Birth: [birth year]  Today's Date: 05/31/2015   First session:  0730-0830  Total minutes = 60  Skilled Therapeutic Interventions/Progress Updates: Patient seen for self feeding  And self care independence with focus on expressive communication, given his aphasia.  Also addressed safety awareness related to visual deficits.   patient independently initiated using right upper extremity but had difficulty executing right wrist strength for mobility.  As well, he lacked fine motor coordination skills and digital strength along with forearm stability.  Patient demonstrated fair balance for standing at sink for completing self care and sat for doffing and donning pants and underwear over feet.  2nd session 1500-1530 total minutes=30  Patient participated in skilled OT to address scapular stability, protraction and retraction along with right UE strengthand stability throughout to digits to increase self care and IADLs.   Therapy Documentation Precautions:  Precautions Precautions: Fall Precaution Comments: expressive aphasia, impulsive Restrictions Weight Bearing Restrictions: No  Pain: none stated  See Function Navigator for Current Functional Status.   Therapy/Group: Individual Therapy  Bud Face [facility] 05/31/2015, 11:57 AM

## 2015-05-31 NOTE — Progress Notes (Signed)
Physical Therapy Session Note  Patient Details  Name: Lucas Morgan MRN: [id number] Date of Birth: 1940-12-22  Today's Date: 05/31/2015 PT Individual Time: 1400-1500 PT Individual Time Calculation (min): 60 min   Short Term Goals: Week 1:  PT Short Term Goal 1 (Week 1): Pt will demonstrate attention to R side during 50% of observed functional tasks without cues PT Short Term Goal 2 (Week 1): Pt will transfer with supervision PT Short Term Goal 3 (Week 1): Pt will amb 200' with supervision and LRAD PT Short Term Goal 4 (Week 1): Pt will demonstrate dynamic standing balance grade of F+  Skilled Therapeutic Interventions/Progress Updates:  Pt propelled w/c to and from the gym for LE strengthening and aerobic conditioning. Pt performed multiple sit to stand transfers without assistive device and S. Pt ambulated 150 feet with rolling walker and S to min guard. Pt ambulated 200 feet without assistive device and min guard to min A with verbal cues. Pt performed step taps and alternating step taps, 3 sets x 10 reps each for NMR. Pt rode Nu-step x 10 minutes at level 3 for LE strengthening and aerobic conditioning. Pt returned to room and left sitting up in w/c with quick release belt in place.   Therapy Documentation Precautions:  Precautions Precautions: Fall Precaution Comments: expressive aphasia, impulsive Restrictions Weight Bearing Restrictions: No General:   Pain: No c/o pain.   See Function Navigator for Current Functional Status.   Therapy/Group: Individual Therapy  Rayford Halsted 05/31/2015, 3:26 PM

## 2015-05-31 NOTE — Progress Notes (Signed)
Subjective/Complaints: Appreciate  Vascular surgery notes One large and one small BM yesterday remains aphasic  ROS difficult to obtain, does appear accurate with Y/N nods, no breathing issues , no Nausea or vomiting  Objective: Vital Signs: Blood pressure 168/69, pulse 88, temperature 98.8 F (37.1 C), temperature source Oral, resp. rate 16, height 5' 6"  (1.676 m), weight 71.714 kg (158 lb 1.6 oz), SpO2 97 %. Dg Abd 1 View  05/30/2015   CLINICAL DATA:  Abdominal pain and tenderness  EXAM: ABDOMEN - 1 VIEW  COMPARISON:  04/08/2015  FINDINGS: The bowel gas pattern is normal. Moderate stool burden identified throughout the colon. No radio-opaque calculi or other significant radiographic abnormality are seen.  IMPRESSION: 1. Moderate stool burden suggesting constipation.   Electronically Signed   By: Kerby Moors M.D.   On: 05/30/2015 08:58   Results for orders placed or performed during the hospital encounter of 05/27/15 (from the past 72 hour(s))  Glucose, capillary     Status: Abnormal   Collection Time: 05/28/15 11:37 AM  Result Value Ref Range   Glucose-Capillary 223 (H) 65 - 99 mg/dL   Comment 1 Notify RN   Glucose, capillary     Status: Abnormal   Collection Time: 05/28/15  4:42 PM  Result Value Ref Range   Glucose-Capillary 206 (H) 65 - 99 mg/dL   Comment 1 Notify RN   Glucose, capillary     Status: Abnormal   Collection Time: 05/28/15  8:40 PM  Result Value Ref Range   Glucose-Capillary 192 (H) 65 - 99 mg/dL  Glucose, capillary     Status: Abnormal   Collection Time: 05/29/15  6:21 AM  Result Value Ref Range   Glucose-Capillary 169 (H) 65 - 99 mg/dL  Basic metabolic panel     Status: Abnormal   Collection Time: 05/29/15  8:27 AM  Result Value Ref Range   Sodium 134 (L) 135 - 145 mmol/L   Potassium 3.9 3.5 - 5.1 mmol/L   Chloride 98 (L) 101 - 111 mmol/L   CO2 26 22 - 32 mmol/L   Glucose, Bld 250 (H) 65 - 99 mg/dL   BUN 22 (H) 6 - 20 mg/dL   Creatinine, Ser 1.33  (H) 0.61 - 1.24 mg/dL   Calcium 9.0 8.9 - 10.3 mg/dL   GFR calc non Af Amer 51 (L) >60 mL/min   GFR calc Af Amer 60 (L) >60 mL/min    Comment: (NOTE) The eGFR has been calculated using the CKD EPI equation. This calculation has not been validated in all clinical situations. eGFR's persistently <60 mL/min signify possible Chronic Kidney Disease.    Anion gap 10 5 - 15  Glucose, capillary     Status: Abnormal   Collection Time: 05/29/15 11:08 AM  Result Value Ref Range   Glucose-Capillary 151 (H) 65 - 99 mg/dL  Glucose, capillary     Status: Abnormal   Collection Time: 05/29/15  4:12 PM  Result Value Ref Range   Glucose-Capillary 155 (H) 65 - 99 mg/dL  Glucose, capillary     Status: Abnormal   Collection Time: 05/29/15  9:49 PM  Result Value Ref Range   Glucose-Capillary 256 (H) 65 - 99 mg/dL  Glucose, capillary     Status: Abnormal   Collection Time: 05/30/15  6:15 AM  Result Value Ref Range   Glucose-Capillary 101 (H) 65 - 99 mg/dL  Basic metabolic panel     Status: Abnormal   Collection Time: 05/30/15  6:20 AM  Result Value Ref Range   Sodium 137 135 - 145 mmol/L   Potassium 3.9 3.5 - 5.1 mmol/L   Chloride 106 101 - 111 mmol/L   CO2 25 22 - 32 mmol/L   Glucose, Bld 114 (H) 65 - 99 mg/dL   BUN 18 6 - 20 mg/dL   Creatinine, Ser 1.09 0.61 - 1.24 mg/dL   Calcium 8.8 (L) 8.9 - 10.3 mg/dL   GFR calc non Af Amer >60 >60 mL/min   GFR calc Af Amer >60 >60 mL/min    Comment: (NOTE) The eGFR has been calculated using the CKD EPI equation. This calculation has not been validated in all clinical situations. eGFR's persistently <60 mL/min signify possible Chronic Kidney Disease.    Anion gap 6 5 - 15  Urinalysis, Routine w reflex microscopic (not at [facility])     Status: Abnormal   Collection Time: 05/30/15 10:22 AM  Result Value Ref Range   Color, Urine YELLOW YELLOW   APPearance CLEAR CLEAR   Specific Gravity, Urine 1.013 1.005 - 1.030   pH 6.0 5.0 - 8.0   Glucose, UA 250 (A)  NEGATIVE mg/dL   Hgb urine dipstick NEGATIVE NEGATIVE   Bilirubin Urine NEGATIVE NEGATIVE   Ketones, ur NEGATIVE NEGATIVE mg/dL   Protein, ur NEGATIVE NEGATIVE mg/dL   Urobilinogen, UA 1.0 0.0 - 1.0 mg/dL   Nitrite NEGATIVE NEGATIVE   Leukocytes, UA NEGATIVE NEGATIVE    Comment: MICROSCOPIC NOT DONE ON URINES WITH NEGATIVE PROTEIN, BLOOD, LEUKOCYTES, NITRITE, OR GLUCOSE <1000 mg/dL.  Glucose, capillary     Status: Abnormal   Collection Time: 05/30/15 11:07 AM  Result Value Ref Range   Glucose-Capillary 101 (H) 65 - 99 mg/dL  Glucose, capillary     Status: Abnormal   Collection Time: 05/30/15  4:00 PM  Result Value Ref Range   Glucose-Capillary 206 (H) 65 - 99 mg/dL  Glucose, capillary     Status: Abnormal   Collection Time: 05/30/15  9:08 PM  Result Value Ref Range   Glucose-Capillary 132 (H) 65 - 99 mg/dL   Comment 1 Notify RN   Glucose, capillary     Status: Abnormal   Collection Time: 05/31/15  6:40 AM  Result Value Ref Range   Glucose-Capillary 160 (H) 65 - 99 mg/dL     HEENT: Left pupil opacified with reduced (?absent) vision Cardio: RRR and no murmur Resp: CTA B/L and unlabored GI: BS positive and no bowel sounds Extremity:  No Edema Skin:   Other loop recorder site with eccymosis and tenderness Neuro: Lethargic, Cranial Nerve Abnormalities Right central 7, Abnormal Sensory withdraws to pinch on right but unable to assess due to aphasia, Abnormal Motor 3- Right delt, bi, tri, 2- R WE, FE, 3- R HF, KE 2- ankle, Dysarthric and Aphasic Musc/Skel:  No pain with LE ROM Gen NAD   Assessment/Plan: 1. Functional deficits secondary to Bilateral ACA/PCA infarct which require 3+ hours per day of interdisciplinary therapy in a comprehensive inpatient rehab setting. Physiatrist is providing close team supervision and 24 hour management of active medical problems listed below. Physiatrist and rehab team continue to assess barriers to discharge/monitor patient progress toward  functional and medical goals. FIM: Function - Bathing Position: Wheelchair/chair at sink Body parts bathed by patient: Right arm, Chest, Abdomen, Front perineal area, Buttocks, Right upper leg, Left upper leg Body parts bathed by helper: Left lower leg, Right lower leg, Left arm Assist Level: Touching or steadying assistance(Pt > 75%)  Function- Upper Body  Dressing/Undressing What is the patient wearing?: Pull over shirt/dress Pull over shirt/dress - Perfomed by patient: Thread/unthread left sleeve, Put head through opening, Pull shirt over trunk Pull over shirt/dress - Perfomed by helper: Thread/unthread right sleeve Assist Level: Touching or steadying assistance(Pt > 75%) Function - Lower Body Dressing/Undressing What is the patient wearing?: Underwear, Pants, Socks, Shoes Position: Wheelchair/chair at [employer] - Performed by patient: Thread/unthread left underwear leg, Pull underwear up/down Underwear - Performed by helper: Thread/unthread right underwear leg Pants- Performed by patient: Thread/unthread left pants leg, Pull pants up/down, Fasten/unfasten pants Pants- Performed by helper: Thread/unthread right pants leg Socks - Performed by patient: Don/doff right sock, Don/doff left sock Socks - Performed by helper: Don/doff right sock, Don/doff left sock Shoes - Performed by patient: Don/doff right shoe, Don/doff left shoe Shoes - Performed by helper: Fasten right, Fasten left Assist Level: Touching or steadying assistance (Pt > 75%)  Function - Toileting Toileting steps completed by patient: Adjust clothing prior to toileting, Adjust clothing after toileting, Performs perineal hygiene Toileting steps completed by helper: Adjust clothing after toileting Toileting Assistive Devices: Grab bar or rail Assist level: Supervision or verbal cues  Function - Air cabin crew transfer assistive device: Grab bar Assist level to toilet: Supervision or verbal cues Assist level  from toilet: Supervision or verbal cues  Function - Chair/bed transfer Chair/bed transfer method: Ambulatory Chair/bed transfer assist level: Supervision or verbal cues Chair/bed transfer assistive device: Armrests Chair/bed transfer details: Verbal cues for precautions/safety  Function - Locomotion: Wheelchair Will patient use wheelchair at discharge?: No Function - Locomotion: Ambulation Assistive device:  (no AD) Max distance: 150' x 2 reps Assist level: Touching or steadying assistance (Pt > 75%) Assist level: Supervision or verbal cues Assist level: Touching or steadying assistance (Pt > 75%) Assist level: Touching or steadying assistance (Pt > 75%) Assist level: Touching or steadying assistance (Pt > 75%)  Function - Comprehension Comprehension: Auditory Comprehension assist level: Follows basic conversation/direction with extra time/assistive device  Function - Expression Expression: Verbal Expression assist level: Expresses basic 25 - 49% of the time/requires cueing 50 - 75% of the time. Uses single words/gestures.  Function - Social Interaction Social Interaction assist level: Interacts appropriately 75 - 89% of the time - Needs redirection for appropriate language or to initiate interaction.  Function - Problem Solving Problem solving assist level: Solves basic 75 - 89% of the time/requires cueing 10 - 24% of the time  Function - Memory Memory assist level: Recognizes or recalls 75 - 89% of the time/requires cueing 10 - 24% of the time Patient normally able to recall (first 3 days only): Current season, Staff names and faces, That he or she is in a hospital  Medical Problem List and Plan: 1. 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  DVT Prophylaxis/Anticoagulation:  Pharmaceutical: Lovenox 3. Chronic pain/Pain Management: Continue hydrocodone prn for OA/DDD.    4. Adjustment reaction/ Mood: Team to provide ego support.  LCSW to follow for evaluation and support. Continue Xanax bid.  Low dose 5. Neuropsych: This patient is capable of making decisions on his own behalf. 6. Skin/Wound Care: routine pressure relief measures 7. Fluids/Electrolytes/Nutrition: Monitor I/O. BUB ok no IVF. Push po fluids 8. Acute Renal insufficiency: resolved 9.  CAD/AAA:  Stable on ASA and zocor.   10. DM  type 2: Will monitor BS ac/hs. Was on lantus 75 and glipizide at home.  Will cont lantus to 45 units cont meal coverage for now.  add glipizide low dose since taking 100% meals. Use SSI for elevated BS.  Overall improving no further changes for now 11. HTN:  Blood pressures are still on low side (168/69). Continue to hold lisinopril and avoid hypotension/hypoperfusion. Offer fluids between meals.     12. Insomnia: should improve with increased activity 13.  "moderate" stool burden suggesting constipation, prophyllactic laxatives LOS (Days) 4 A FACE TO FACE EVALUATION WAS PERFORMED  KIRSTEINS,ANDREW E 05/31/2015, 8:58 AM

## 2015-06-01 ENCOUNTER — Inpatient Hospital Stay (HOSPITAL_COMMUNITY): Payer: Medicare Other | Admitting: *Deleted

## 2015-06-01 ENCOUNTER — Ambulatory Visit (HOSPITAL_COMMUNITY): Payer: Medicare Other | Admitting: Occupational Therapy

## 2015-06-01 LAB — GLUCOSE, CAPILLARY
GLUCOSE-CAPILLARY: 169 mg/dL — AB (ref 65–99)
GLUCOSE-CAPILLARY: 263 mg/dL — AB (ref 65–99)
Glucose-Capillary: 124 mg/dL — ABNORMAL HIGH (ref 65–99)
Glucose-Capillary: 105 mg/dL — ABNORMAL HIGH (ref 65–99)

## 2015-06-01 NOTE — Progress Notes (Signed)
Subjective/Complaints:  remains aphasic, nods no to dizziness. Sleepy this am, already had PT  ROS difficult to obtain, does appear accurate with Y/N nods, no breathing issues , no Nausea or vomiting  Objective: Vital Signs: Blood pressure 116/72, pulse 114, temperature 98.3 F (36.8 C), temperature source Oral, resp. rate 17, height _0  (1.676 m), weight 71.714 kg (158 lb 1.6 oz), SpO2 95 %. No results found. Results for orders placed or performed during the hospital encounter of 05/27/15 (from the past 72 hour(s))  Glucose, capillary     Status: Abnormal   Collection Time: 05/29/15 11:08 AM  Result Value Ref Range   Glucose-Capillary 151 (H) 65 - 99 mg/dL  Glucose, capillary     Status: Abnormal   Collection Time: 05/29/15  4:12 PM  Result Value Ref Range   Glucose-Capillary 155 (H) 65 - 99 mg/dL  Glucose, capillary     Status: Abnormal   Collection Time: 05/29/15  9:49 PM  Result Value Ref Range   Glucose-Capillary 256 (H) 65 - 99 mg/dL  Glucose, capillary     Status: Abnormal   Collection Time: 05/30/15  6:15 AM  Result Value Ref Range   Glucose-Capillary 101 (H) 65 - 99 mg/dL  Basic metabolic panel     Status: Abnormal   Collection Time: 05/30/15  6:20 AM  Result Value Ref Range   Sodium 137 135 - 145 mmol/L   Potassium 3.9 3.5 - 5.1 mmol/L   Chloride 106 101 - 111 mmol/L   CO2 25 22 - 32 mmol/L   Glucose, Bld 114 (H) 65 - 99 mg/dL   BUN 18 6 - 20 mg/dL   Creatinine, Ser 1.09 0.61 - 1.24 mg/dL   Calcium 8.8 (L) 8.9 - 10.3 mg/dL   GFR calc non Af Amer >60 >60 mL/min   GFR calc Af Amer >60 >60 mL/min    Comment: (NOTE) The eGFR has been calculated using the CKD EPI equation. This calculation has not been validated in all clinical situations. eGFR's persistently <60 mL/min signify possible Chronic Kidney Disease.    Anion gap 6 5 - 15  Urinalysis, Routine w reflex microscopic (not at [facility])     Status: Abnormal   Collection Time: 05/30/15 10:22 AM  Result  Value Ref Range   Color, Urine YELLOW YELLOW   APPearance CLEAR CLEAR   Specific Gravity, Urine 1.013 1.005 - 1.030   pH 6.0 5.0 - 8.0   Glucose, UA 250 (A) NEGATIVE mg/dL   Hgb urine dipstick NEGATIVE NEGATIVE   Bilirubin Urine NEGATIVE NEGATIVE   Ketones, ur NEGATIVE NEGATIVE mg/dL   Protein, ur NEGATIVE NEGATIVE mg/dL   Urobilinogen, UA 1.0 0.0 - 1.0 mg/dL   Nitrite NEGATIVE NEGATIVE   Leukocytes, UA NEGATIVE NEGATIVE    Comment: MICROSCOPIC NOT DONE ON URINES WITH NEGATIVE PROTEIN, BLOOD, LEUKOCYTES, NITRITE, OR GLUCOSE <1000 mg/dL.  Glucose, capillary     Status: Abnormal   Collection Time: 05/30/15 11:07 AM  Result Value Ref Range   Glucose-Capillary 101 (H) 65 - 99 mg/dL  Glucose, capillary     Status: Abnormal   Collection Time: 05/30/15  4:00 PM  Result Value Ref Range   Glucose-Capillary 206 (H) 65 - 99 mg/dL  Glucose, capillary     Status: Abnormal   Collection Time: 05/30/15  9:08 PM  Result Value Ref Range   Glucose-Capillary 132 (H) 65 - 99 mg/dL   Comment 1 Notify RN   Glucose, capillary  Status: Abnormal   Collection Time: 05/31/15  6:40 AM  Result Value Ref Range   Glucose-Capillary 160 (H) 65 - 99 mg/dL  Basic metabolic panel     Status: Abnormal   Collection Time: 05/31/15  8:07 AM  Result Value Ref Range   Sodium 136 135 - 145 mmol/L   Potassium 4.1 3.5 - 5.1 mmol/L   Chloride 102 101 - 111 mmol/L   CO2 24 22 - 32 mmol/L   Glucose, Bld 176 (H) 65 - 99 mg/dL   BUN 21 (H) 6 - 20 mg/dL   Creatinine, Ser 1.23 0.61 - 1.24 mg/dL   Calcium 9.0 8.9 - 10.3 mg/dL   GFR calc non Af Amer 56 (L) >60 mL/min   GFR calc Af Amer >60 >60 mL/min    Comment: (NOTE) The eGFR has been calculated using the CKD EPI equation. This calculation has not been validated in all clinical situations. eGFR's persistently <60 mL/min signify possible Chronic Kidney Disease.    Anion gap 10 5 - 15  Glucose, capillary     Status: Abnormal   Collection Time: 05/31/15 11:25 AM   Result Value Ref Range   Glucose-Capillary 146 (H) 65 - 99 mg/dL  Glucose, capillary     Status: Abnormal   Collection Time: 05/31/15  4:30 PM  Result Value Ref Range   Glucose-Capillary 188 (H) 65 - 99 mg/dL  Glucose, capillary     Status: Abnormal   Collection Time: 05/31/15  9:00 PM  Result Value Ref Range   Glucose-Capillary 119 (H) 65 - 99 mg/dL  Glucose, capillary     Status: Abnormal   Collection Time: 06/01/15  6:58 AM  Result Value Ref Range   Glucose-Capillary 124 (H) 65 - 99 mg/dL     HEENT: Left pupil opacified with reduced (?absent) vision Cardio: RRR and no murmur Resp: CTA B/L and unlabored GI: BS positive and no bowel sounds Extremity:  No Edema Skin:   Other loop recorder site with eccymosis and tenderness Neuro: Lethargic, Cranial Nerve Abnormalities Right central 7, Abnormal Sensory withdraws to pinch on right but unable to assess due to aphasia, Abnormal Motor 3- Right delt, bi, tri, 2- R WE, FE, 3- R HF, KE 2- ankle, Dysarthric and Aphasic Musc/Skel:  No pain with LE ROM Gen NAD   Assessment/Plan: 1. Functional deficits secondary to Bilateral ACA/PCA infarct which require 3+ hours per day of interdisciplinary therapy in a comprehensive inpatient rehab setting. Physiatrist is providing close team supervision and 24 hour management of active medical problems listed below. Physiatrist and rehab team continue to assess barriers to discharge/monitor patient progress toward functional and medical goals. FIM: Function - Bathing Position: Standing at sink Body parts bathed by patient: Right arm, Left arm, Chest, Abdomen, Front perineal area, Buttocks, Right upper leg, Left upper leg Body parts bathed by helper: Left lower leg, Right lower leg, Left arm Assist Level: Touching or steadying assistance(Pt > 75%)  Function- Upper Body Dressing/Undressing What is the patient wearing?: Pull over shirt/dress Pull over shirt/dress - Perfomed by patient: Thread/unthread  right sleeve, Thread/unthread left sleeve, Put head through opening, Pull shirt over trunk Pull over shirt/dress - Perfomed by helper: Thread/unthread right sleeve Assist Level: Touching or steadying assistance(Pt > 75%) Function - Lower Body Dressing/Undressing What is the patient wearing?: Pants, Underwear, Non-skid slipper socks Position: Wheelchair/chair at sink Underwear - Performed by patient: Thread/unthread right underwear leg, Thread/unthread left underwear leg, Pull underwear up/down Underwear - Performed by helper: Thread/unthread  right underwear leg Pants- Performed by patient: Thread/unthread right pants leg, Thread/unthread left pants leg, Pull pants up/down Pants- Performed by helper: Thread/unthread right pants leg Socks - Performed by patient: Don/doff right sock, Don/doff left sock Socks - Performed by helper: Don/doff right sock, Don/doff left sock Shoes - Performed by patient: Don/doff right shoe, Don/doff left shoe, Fasten left Shoes - Performed by helper: Fasten right, Fasten left Assist Level: Touching or steadying assistance (Pt > 75%)  Function - Toileting Toileting steps completed by patient: Adjust clothing prior to toileting, Performs perineal hygiene, Adjust clothing after toileting Toileting steps completed by helper: Adjust clothing after toileting Toileting Assistive Devices: Grab bar or rail Assist level: Touching or steadying assistance (Pt.75%)  Function - Toilet Transfers Toilet transfer assistive device: Grab bar Assist level to toilet: Supervision or verbal cues Assist level from toilet: Supervision or verbal cues  Function - Chair/bed transfer Chair/bed transfer method: Ambulatory Chair/bed transfer assist level: Supervision or verbal cues Chair/bed transfer assistive device: Armrests Chair/bed transfer details: Verbal cues for precautions/safety  Function - Locomotion: Wheelchair Will patient use wheelchair at discharge?: No Max wheelchair  distance: 150 Assist Level: Supervision or verbal cues Assist Level: Supervision or verbal cues Assist Level: Supervision or verbal cues Function - Locomotion: Ambulation Assistive device: No device (Hanh held assist) Max distance: 200 Assist level: Touching or steadying assistance (Pt > 75%) Assist level: Supervision or verbal cues Assist level: Touching or steadying assistance (Pt > 75%) Assist level: Touching or steadying assistance (Pt > 75%) Assist level: Touching or steadying assistance (Pt > 75%)  Function - Comprehension Comprehension: Auditory Comprehension assist level: Understands basic 75 - 89% of the time/ requires cueing 10 - 24% of the time  Function - Expression Expression: Verbal (aphasic, one word, points,gestures) Expression assist level: Expresses basic 25 - 49% of the time/requires cueing 50 - 75% of the time. Uses single words/gestures.  Function - Social Interaction Social Interaction assist level: Interacts appropriately 75 - 89% of the time - Needs redirection for appropriate language or to initiate interaction. (anti-anxiety meds scheduled)  Function - Problem Solving Problem solving assist level: Solves basic 25 - 49% of the time - needs direction more than half the time to initiate, plan or complete simple activities  Function - Memory Memory assist level: Recognizes or recalls 25 - 49% of the time/requires cueing 50 - 75% of the time Patient normally able to recall (first 3 days only): Staff names and faces, That he or she is in a hospital  Medical Problem List and Plan: 1. 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  DVT Prophylaxis/Anticoagulation: Pharmaceutical: Lovenox 3. Chronic pain/Pain Management: Continue hydrocodone prn for OA/DDD.    4. Adjustment  reaction/ Mood: Team to provide ego support.  LCSW to follow for evaluation and support. Continue Xanax bid.  Low dose 5. Neuropsych: This patient is capable of making decisions on his own behalf. 6. Skin/Wound Care: routine pressure relief measures 7. Fluids/Electrolytes/Nutrition: Monitor I/O. BUN ok no IVF. Push po fluids 8. Acute Renal insufficiency: resolved 9.  CAD/AAA:  Stable on ASA and zocor.   10. DM type 2: Will monitor BS ac/hs. Was on lantus 75 and glipizide at home.  Will cont lantus to 45 units cont meal coverage for now.  add glipizide low dose since taking  100% meals. Use SSI for elevated BS.  Overall improving no further changes for now 11. HTN:  Blood pressures  (116/72). Continue to hold lisinopril and avoid hypotension/hypoperfusion. Offer fluids between meals.     12. Insomnia: should improve with increased activity 13.  "moderate" stool burden suggesting constipation, prophyllactic laxatives 14.  Orthostatic drop in BP no BP meds, BMET showed mild elevation of BUN, fluid intake very good.  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

## 2015-06-01 NOTE — Progress Notes (Signed)
Physical Therapy Session Note  Patient Details  Name: Lucas Morgan MRN: [id number] Date of Birth: Sep 23, 1940  Today's Date: 06/01/2015 PT Individual Time: 0800-0845 PT Individual Time Calculation (min): 45 min    Skilled Therapeutic Interventions/Progress Updates:  Patient in room , in bed at the beginning of the session, agrees to therapy interventions. Transfer to sitting EOB with SBA and to stand SBA, cues for safety. Gait Training w/o AD to and from gym with min guard and CGA for safety and due to mild LOB to R.  NuStep 1 x 10 min wto increase activity tolerance and strength, verbal cues for continuation and to keep R UE engaged in exercise.  Training in navigating up and down low and high steps with B rails with minA 2 x staicase (12 steps) close guidance and cues for sequencing and reducing speed-patient impulsive.  Therapeutic exercises  With 3 lbs on B ankles in sitting LAQ 2 x 10 in standing alternate step ups on 7 inch step. In supine bridging and clams, all in order to increase coordination and strength. Alternate movements with max A ,due to decreased coordination and initiation.  Sit to stand x 10 w/o UE support while holding a weighted bar.  Patient returned to room ,to bed , with alarm activated and all needs within reach.  Therapy Documentation Precautions:  Precautions Precautions: Fall Precaution Comments: expressive aphasia, impulsive Restrictions Weight Bearing Restrictions: No  Therapy Vitals Temp: 98.3 F (36.8 C) Temp Source: Oral Pulse Rate: (!) 114 Resp: 17 BP: 116/72 mmHg Patient Position (if appropriate): Sitting Oxygen Therapy SpO2: 95 % O2 Device: Not Delivered   See Function Navigator for Current Functional Status.   Therapy/Group: Individual Therapy  Dorna Mai 06/01/2015, 8:45 AM

## 2015-06-02 ENCOUNTER — Inpatient Hospital Stay (HOSPITAL_COMMUNITY): Payer: Medicare Other | Admitting: Physical Therapy

## 2015-06-02 ENCOUNTER — Inpatient Hospital Stay (HOSPITAL_COMMUNITY): Payer: Medicare Other | Admitting: Speech Pathology

## 2015-06-02 ENCOUNTER — Inpatient Hospital Stay (HOSPITAL_COMMUNITY): Payer: Medicare Other

## 2015-06-02 LAB — GLUCOSE, CAPILLARY
Glucose-Capillary: 117 mg/dL — ABNORMAL HIGH (ref 65–99)
Glucose-Capillary: 68 mg/dL (ref 65–99)
Glucose-Capillary: 117 mg/dL — ABNORMAL HIGH (ref 65–99)
Glucose-Capillary: 202 mg/dL — ABNORMAL HIGH (ref 65–99)
Glucose-Capillary: 222 mg/dL — ABNORMAL HIGH (ref 65–99)

## 2015-06-02 MED ORDER — GLIPIZIDE 10 MG PO TABS
5.0000 mg | ORAL_TABLET | Freq: Every day | ORAL | Status: DC
  Administered 2015-06-03 – 2015-06-11 (×9): 5 mg via ORAL
  Filled 2015-06-02 (×9): qty 1

## 2015-06-02 NOTE — Progress Notes (Signed)
Speech Language Pathology Daily Session Note  Patient Details  Name: Lucas Morgan MRN: [id number] Date of Birth: 28-May-1941  Today's Date: 06/02/2015 SLP Individual Time: 1400-1500 SLP Individual Time Calculation (min): 60 min  Short Term Goals: Week 1: SLP Short Term Goal 1 (Week 1): Pt will follow 2-3 step directions with Sueprvision level verbal cues SLP Short Term Goal 2 (Week 1): Pt will utilize pictures and gestures as non-verbal means of communicating basic needs and wants with Mod assist   SLP Short Term Goal 3 (Week 1): Pt will verbally express phrase length utterances durign structured tasks with Mod semantic cues.  SLP Short Term Goal 4 (Week 1): Pt will demonstrate basic problem solving with Min verbal and visual cues SLP Short Term Goal 5 (Week 1): Pt will attend to right upper extremity and environment during structured tasks with Min verbal and visual cues SLP Short Term Goal 6 (Week 1): Pt will request help as needed during self-care tasks with Mod question cues  Skilled Therapeutic Interventions:  Pt was seen for skilled ST targeting communication goals.  Upon arrival, pt was asleep in bed but easily awakened to voice and light touch.  Pt was transferred to wheelchair to maximize attention and alertness during structured therapeutic tasks.  SLP facilitated the session with structured picture description task targeting verbal expression at the phrase level.  Pt identified inaccuracies and differences in pictures with mod-max assist multimodal cues for accurate production of names of objects in pictures.  Pt was able to expand length of utterance to short phrases in <50% of opportunities.  Pt was noted to become verbally perseverative intermittently throughout task and required mod assist verbal cues to recognize and correct errors.  Pt also benefited from mod-max assist verbal cues and extra time to increase vocal intensity and slow rate of speech in order to improve intelligibility  when producing words and phrases.    Pt was returned to room, transferred back to bed and left with call bell within reach, bed alarm activated.  Continue per current plan of care.    Function:  Eating Eating                 Cognition Comprehension Comprehension assist level: Understands basic 75 - 89% of the time/ requires cueing 10 - 24% of the time  Expression   Expression assist level: Expresses basis less than 25% of the time/requires cueing >75% of the time.  Social Interaction Social Interaction assist level: Interacts appropriately 50 - 74% of the time - May be physically or verbally inappropriate.  Problem Solving Problem solving assist level: Solves basic 25 - 49% of the time - needs direction more than half the time to initiate, plan or complete simple activities  Memory Memory assist level: Recognizes or recalls 25 - 49% of the time/requires cueing 50 - 75% of the time    Pain Pain Assessment Pain Assessment: No/denies pain   Therapy/Group: Individual Therapy  Page, Melanee Spry 06/02/2015, 4:07 PM

## 2015-06-02 NOTE — Progress Notes (Signed)
Subjective/Complaints:  Pt without c/o but is limited by aphasia Ate all of his B and G   ROS difficult to obtain, does appear accurate with Y/N nods, no breathing issues , no Nausea or vomiting  Objective: Vital Signs: Blood pressure 110/68, pulse 82, temperature 97.9 F (36.6 C), temperature source Oral, resp. rate 17, height _0  (1.676 m), weight 71.714 kg (158 lb 1.6 oz), SpO2 99 %. No results found. Results for orders placed or performed during the hospital encounter of 05/27/15 (from the past 72 hour(s))  Urinalysis, Routine w reflex microscopic (not at [facility])     Status: Abnormal   Collection Time: 05/30/15 10:22 AM  Result Value Ref Range   Color, Urine YELLOW YELLOW   APPearance CLEAR CLEAR   Specific Gravity, Urine 1.013 1.005 - 1.030   pH 6.0 5.0 - 8.0   Glucose, UA 250 (A) NEGATIVE mg/dL   Hgb urine dipstick NEGATIVE NEGATIVE   Bilirubin Urine NEGATIVE NEGATIVE   Ketones, ur NEGATIVE NEGATIVE mg/dL   Protein, ur NEGATIVE NEGATIVE mg/dL   Urobilinogen, UA 1.0 0.0 - 1.0 mg/dL   Nitrite NEGATIVE NEGATIVE   Leukocytes, UA NEGATIVE NEGATIVE    Comment: MICROSCOPIC NOT DONE ON URINES WITH NEGATIVE PROTEIN, BLOOD, LEUKOCYTES, NITRITE, OR GLUCOSE <1000 mg/dL.  Glucose, capillary     Status: Abnormal   Collection Time: 05/30/15 11:07 AM  Result Value Ref Range   Glucose-Capillary 101 (H) 65 - 99 mg/dL  Glucose, capillary     Status: Abnormal   Collection Time: 05/30/15  4:00 PM  Result Value Ref Range   Glucose-Capillary 206 (H) 65 - 99 mg/dL  Glucose, capillary     Status: Abnormal   Collection Time: 05/30/15  9:08 PM  Result Value Ref Range   Glucose-Capillary 132 (H) 65 - 99 mg/dL   Comment 1 Notify RN   Glucose, capillary     Status: Abnormal   Collection Time: 05/31/15  6:40 AM  Result Value Ref Range   Glucose-Capillary 160 (H) 65 - 99 mg/dL  Basic metabolic panel     Status: Abnormal   Collection Time: 05/31/15  8:07 AM  Result Value Ref Range   Sodium  136 135 - 145 mmol/L   Potassium 4.1 3.5 - 5.1 mmol/L   Chloride 102 101 - 111 mmol/L   CO2 24 22 - 32 mmol/L   Glucose, Bld 176 (H) 65 - 99 mg/dL   BUN 21 (H) 6 - 20 mg/dL   Creatinine, Ser 1.23 0.61 - 1.24 mg/dL   Calcium 9.0 8.9 - 10.3 mg/dL   GFR calc non Af Amer 56 (L) >60 mL/min   GFR calc Af Amer >60 >60 mL/min    Comment: (NOTE) The eGFR has been calculated using the CKD EPI equation. This calculation has not been validated in all clinical situations. eGFR's persistently <60 mL/min signify possible Chronic Kidney Disease.    Anion gap 10 5 - 15  Glucose, capillary     Status: Abnormal   Collection Time: 05/31/15 11:25 AM  Result Value Ref Range   Glucose-Capillary 146 (H) 65 - 99 mg/dL  Glucose, capillary     Status: Abnormal   Collection Time: 05/31/15  4:30 PM  Result Value Ref Range   Glucose-Capillary 188 (H) 65 - 99 mg/dL  Glucose, capillary     Status: Abnormal   Collection Time: 05/31/15  9:00 PM  Result Value Ref Range   Glucose-Capillary 119 (H) 65 - 99 mg/dL  Glucose,  capillary     Status: Abnormal   Collection Time: 06/01/15  6:58 AM  Result Value Ref Range   Glucose-Capillary 124 (H) 65 - 99 mg/dL  Glucose, capillary     Status: Abnormal   Collection Time: 06/01/15 11:23 AM  Result Value Ref Range   Glucose-Capillary 169 (H) 65 - 99 mg/dL  Glucose, capillary     Status: Abnormal   Collection Time: 06/01/15  4:33 PM  Result Value Ref Range   Glucose-Capillary 105 (H) 65 - 99 mg/dL  Glucose, capillary     Status: Abnormal   Collection Time: 06/01/15  9:02 PM  Result Value Ref Range   Glucose-Capillary 263 (H) 65 - 99 mg/dL   Comment 1 Notify RN   Glucose, capillary     Status: Abnormal   Collection Time: 06/02/15  6:45 AM  Result Value Ref Range   Glucose-Capillary 117 (H) 65 - 99 mg/dL     HEENT: Left pupil opacified with reduced (?absent) vision Cardio: RRR and no murmur Resp: CTA B/L and unlabored GI: BS positive and no bowel  sounds Extremity:  No Edema Skin:   Other loop recorder site with eccyhmosis and minmal tenderness Neuro: Lethargic, Cranial Nerve Abnormalities Right central 7, Abnormal Sensory withdraws to pinch on right but unable to assess due to aphasia, Abnormal Motor 3- Right delt, bi, tri, 2- R WE, FE, 3- R HF, KE 2- ankle, Dysarthric and Aphasic Musc/Skel:  No pain with LE ROM Gen NAD   Assessment/Plan: 1. Functional deficits secondary to Bilateral ACA/PCA infarct which require 3+ hours per day of interdisciplinary therapy in a comprehensive inpatient rehab setting. Physiatrist is providing close team supervision and 24 hour management of active medical problems listed below. Physiatrist and rehab team continue to assess barriers to discharge/monitor patient progress toward functional and medical goals. FIM: Function - Bathing Position: Standing at sink Body parts bathed by patient: Right arm, Left arm, Chest, Abdomen, Front perineal area, Buttocks, Right upper leg, Left upper leg Body parts bathed by helper: Left lower leg, Right lower leg, Left arm Assist Level: Touching or steadying assistance(Pt > 75%)  Function- Upper Body Dressing/Undressing What is the patient wearing?: Pull over shirt/dress Pull over shirt/dress - Perfomed by patient: Thread/unthread right sleeve, Thread/unthread left sleeve, Put head through opening, Pull shirt over trunk Pull over shirt/dress - Perfomed by helper: Thread/unthread right sleeve Assist Level: Touching or steadying assistance(Pt > 75%) Function - Lower Body Dressing/Undressing What is the patient wearing?: Pants, Underwear, Non-skid slipper socks Position: Wheelchair/chair at sink Underwear - Performed by patient: Thread/unthread right underwear leg, Thread/unthread left underwear leg, Pull underwear up/down Underwear - Performed by helper: Thread/unthread right underwear leg Pants- Performed by patient: Thread/unthread right pants leg, Thread/unthread  left pants leg, Pull pants up/down Pants- Performed by helper: Thread/unthread right pants leg Socks - Performed by patient: Don/doff right sock, Don/doff left sock Socks - Performed by helper: Don/doff right sock, Don/doff left sock Shoes - Performed by patient: Don/doff right shoe, Don/doff left shoe, Fasten left Shoes - Performed by helper: Fasten right, Fasten left Assist Level: Touching or steadying assistance (Pt > 75%)  Function - Toileting Toileting steps completed by patient: Adjust clothing prior to toileting, Performs perineal hygiene, Adjust clothing after toileting Toileting steps completed by helper: Adjust clothing after toileting Toileting Assistive Devices: Grab bar or rail Assist level: Touching or steadying assistance (Pt.75%)  Function - Toilet Transfers Toilet transfer assistive device: Grab bar Assist level to toilet: Supervision or verbal  cues Assist level from toilet: Supervision or verbal cues  Function - Chair/bed transfer Chair/bed transfer method: Ambulatory Chair/bed transfer assist level: Supervision or verbal cues Chair/bed transfer assistive device: Armrests Chair/bed transfer details: Verbal cues for precautions/safety  Function - Locomotion: Wheelchair Will patient use wheelchair at discharge?: No Max wheelchair distance: 150 Assist Level: Supervision or verbal cues Assist Level: Supervision or verbal cues Assist Level: Supervision or verbal cues Function - Locomotion: Ambulation Assistive device: No device (Hanh held assist) Max distance: 200 Assist level: Touching or steadying assistance (Pt > 75%) Assist level: Supervision or verbal cues Assist level: Touching or steadying assistance (Pt > 75%) Assist level: Touching or steadying assistance (Pt > 75%) Assist level: Touching or steadying assistance (Pt > 75%)  Function - Comprehension Comprehension: Auditory Comprehension assist level: Understands basic 75 - 89% of the time/ requires  cueing 10 - 24% of the time  Function - Expression Expression: Verbal Expression assist level: Expresses basic 50 - 74% of the time/requires cueing 25 - 49% of the time. Needs to repeat parts of sentences.  Function - Social Interaction Social Interaction assist level: Interacts appropriately 75 - 89% of the time - Needs redirection for appropriate language or to initiate interaction.  Function - Problem Solving Problem solving assist level: Solves basic 25 - 49% of the time - needs direction more than half the time to initiate, plan or complete simple activities  Function - Memory Memory assist level: Recognizes or recalls 25 - 49% of the time/requires cueing 50 - 75% of the time Patient normally able to recall (first 3 days only): Staff names and faces, That he or she is in a hospital  Medical Problem List and Plan: 1. 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  DVT Prophylaxis/Anticoagulation: Pharmaceutical: Lovenox 3. Chronic pain/Pain Management: Continue hydrocodone prn for OA/DDD.    4. Adjustment reaction/ Mood: Team to provide ego support.  LCSW to follow for evaluation and support. Continue Xanax bid.  Low dose 5. Neuropsych: This patient is capable of making decisions on his own behalf. 6. Skin/Wound Care: routine pressure relief measures 7. Fluids/Electrolytes/Nutrition: Monitor I/O.75-100% meals  BUN ok no IVF. Push po fluids 8. Acute Renal insufficiency: resolved 9.  CAD/AAA:  Stable on ASA and zocor.   10. DM type 2: Will monitor BS ac/hs. Was on lantus 75 and glipizide at home.  Will cont lantus to 45 units cont meal coverage for now.  Increase glipizide  Am to help with daytime CBGs (used at home). Use SSI for elevated BS.  Overall improving no further changes for now 11. HTN:   Blood pressures  (110/68). Continue to hold lisinopril and avoid hypotension/hypoperfusion. Offer fluids between meals.     12. Insomnia: should improve with increased activity 13.  "moderate" stool burden suggesting constipation, prophyllactic laxatives 14.  Orthostatic drop in BP no BP meds, BMET showed mild elevation of BUN, fluid intake very good.  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

## 2015-06-02 NOTE — Progress Notes (Signed)
Occupational Therapy Session Note  Patient Details  Name: Lucas Morgan MRN: [id number] Date of Birth: 1941/03/09  Today's Date: 06/02/2015 OT Individual Time: 1000-1100 OT Individual Time Calculation (min): 60 min    Short Term Goals: Week 1:  OT Short Term Goal 1 (Week 1): Pt will locate items to the right during self care task with min verbal cues. OT Short Term Goal 2 (Week 1): Pt will perform LB dressing with mod A in order to decrease the level of assist needed for self care.  OT Short Term Goal 3 (Week 1): Pt will perform UB dressing with min A in order to decrease the level of assist needed for self care.  OT Short Term Goal 4 (Week 1): Pt will perform shower transfer with min A onto TTB in order to increase I with functional transfers.  Skilled Therapeutic Interventions/Progress Updates: ADL-retraining with focus on improved attention, sequencing, safety awareness, dynamic standing balance, and visual scanning as compensation for impaired visual processing.   Pt received supine in bed, his legs partially out of bed.   With setup assist to manage DME, pt rises to sit and EOB and ambulates to shower with close supervision.   Pt bathes sitting/standing with setup to handle hand shower and verbal cues to progress through sequence as pt bathes.   Pt dresses at w/c level, standing to pull up pants with min vc to problem-solve.   Pt grooms at sink with setup and ambulates approx 200' in hallway while practicing visual scanning.  Pt requires 1 rest break during mobility.   Pt returned to room to sit in recliner with setup to apply QRB and place call light wtihin reach.  Therapy Documentation Precautions:  Precautions Precautions: Fall Precaution Comments: expressive aphasia, impulsive Restrictions Weight Bearing Restrictions: No  Pain: Pain Assessment Pain Assessment: No/denies pain  See Function Navigator for Current Functional Status.   Therapy/Group: Individual Therapy   Second  session: Time: 1300-1330 Time Calculation (min):  30 min  Pain Assessment: No/denies pain  Skilled Therapeutic Interventions: Therapeutic activity with focus on family ed (son and dtr-in-law) on pt's performance with self-care, pt re-ed on use of orthotic to improve right hand FMC (faciliating wrist extension), and NMR of RUE.   With son present, pt received in recliner and requesting his shoes.   Pt doffed non-skid socks and donned shoes, tying his left shoe laces as son watched.   Pt required assist with tying right shoe laces.   Pt then ambulated to gym with therapist and performed reach/pinch activity using clothespins X 15 minutes. Pt demo's excellent sustained attention to task during entire session although requesting rest break 1 time after ambulating to gym.   See FIM for current functional status  Therapy/Group: Individual Therapy  BARTHOLD,FRANK 06/02/2015, 3:01 PM

## 2015-06-02 NOTE — Progress Notes (Signed)
Physical Therapy Session Note  Patient Details  Name: Lucas Morgan MRN: [id number] Date of Birth: 19-Feb-1941  Today's Date: 06/02/2015 PT Individual Time: 1100-1200 PT Individual Time Calculation (min): 60 min   Short Term Goals: Week 1:  PT Short Term Goal 1 (Week 1): Pt will demonstrate attention to R side during 50% of observed functional tasks without cues PT Short Term Goal 2 (Week 1): Pt will transfer with supervision PT Short Term Goal 3 (Week 1): Pt will amb 200' with supervision and LRAD PT Short Term Goal 4 (Week 1): Pt will demonstrate dynamic standing balance grade of F+  Skilled Therapeutic Interventions/Progress Updates:   Pt received in recliner attempting to find sock and shoe for L foot; assisted pt with scanning his environment to locate items; provided verbal encouragement for use of RUE to assist with donning sock and shoe.  Engaged pt in conversation about D/C plan and home set up; pt able to answer intermittently with one word phrases but relied mostly on head nods which were consistent.  Pt transferred with supervision and performed gait in controlled environment x 150' with min HHA with intermittent mod A for balance recovery when dual tasking (carrying cup of water in RUE and drinking water during gait) due to LOB to R and inattention to RUE and R environment.  Performed low couch and flat bed mobility training; while seated on couch pt engaged in functional RUE task placing pillow cases on 3 pillow with extra time and cues for use of RUE.  Pt performed transfer from couch with supervision and bed mobility on elevated bed mod I.  In gym performed stair negotiation up/down 12 stairs with bilat UE support on rails to focus on attention to RUE position, use of alternating sequence to facilitate lateral and anterior weight shifting and full step length RLE.  Continued balance, attention to R and use of RUE training with pt reaching into bin on R to grab bean bags with RUE and  naming object pictured on bean bag before placing it in correct meal pile on floor with focus on weight shifting during reaching and squatting.  Required min-mod A overall and mod-max verbal cues for sequencing and technique.  Returned to room and to recliner where pt was left with quick release belt in place and all items within reach for lunch.   Therapy Documentation Precautions:  Precautions Precautions: Fall Precaution Comments: expressive aphasia, impulsive Restrictions Weight Bearing Restrictions: No Pain: Pain Assessment Pain Assessment: 0-10 Pain Score: 6  Faces Pain Scale: Hurts even more Pain Type: Chronic pain Pain Location: Back Pain Orientation: Lower;Left Pain Descriptors / Indicators: Aching Pain Frequency: Constant Pain Onset: On-going Patients Stated Pain Goal: 3 Pain Intervention(s): Medication (See eMAR)  See Function Navigator for Current Functional Status.   Therapy/Group: Individual Therapy  [facility] 06/02/2015, 12:25 PM

## 2015-06-02 NOTE — Significant Event (Signed)
Hypoglycemic Event  CBG: 68  Treatment: 15 GM carbohydrate snack  Symptoms: Shaky  Follow-up CBG: Time:1230 CBG Result:117  Possible Reasons for Event: Unknown  Comments/MD notified: Patient's lunch arrived, continue to monitor.    Kennyth Arnold G  Remember to initiate Hypoglycemia Order Set & complete

## 2015-06-02 NOTE — Progress Notes (Signed)
Having difficulty sleeping at nights--has nocturia and difficulty handling urinal. Will try condom cath at nights.

## 2015-06-03 ENCOUNTER — Inpatient Hospital Stay (HOSPITAL_COMMUNITY): Payer: Medicare Other | Admitting: Speech Pathology

## 2015-06-03 ENCOUNTER — Inpatient Hospital Stay (HOSPITAL_COMMUNITY): Payer: Medicare Other

## 2015-06-03 ENCOUNTER — Inpatient Hospital Stay (HOSPITAL_COMMUNITY): Payer: Medicare Other | Admitting: Physical Therapy

## 2015-06-03 LAB — GLUCOSE, CAPILLARY
GLUCOSE-CAPILLARY: 110 mg/dL — AB (ref 65–99)
GLUCOSE-CAPILLARY: 76 mg/dL (ref 65–99)
Glucose-Capillary: 144 mg/dL — ABNORMAL HIGH (ref 65–99)
Glucose-Capillary: 211 mg/dL — ABNORMAL HIGH (ref 65–99)

## 2015-06-03 LAB — CREATININE, SERUM
CREATININE: 1.3 mg/dL — AB (ref 0.61–1.24)
GFR calc Af Amer: >60 mL/min (ref >60)
GFR, EST NON AFRICAN AMERICAN: 53 mL/min — AB (ref >60)

## 2015-06-03 NOTE — Progress Notes (Signed)
 Subjective/Complaints:    Severe aphasia, poor safety awareness   ROS difficult to obtain, does appear accurate with Y/N nods, no breathing issues , no Nausea or vomiting  Objective: Vital Signs: Blood pressure 98/63, pulse 85, temperature 98.4 F (36.9 C), temperature source Oral, resp. rate 18, height 5' 6" (1.676 m), weight 71.714 kg (158 lb 1.6 oz), SpO2 95 %. No results found. Results for orders placed or performed during the hospital encounter of 05/27/15 (from the past 72 hour(s))  Basic metabolic panel     Status: Abnormal   Collection Time: 05/31/15  8:07 AM  Result Value Ref Range   Sodium 136 135 - 145 mmol/L   Potassium 4.1 3.5 - 5.1 mmol/L   Chloride 102 101 - 111 mmol/L   CO2 24 22 - 32 mmol/L   Glucose, Bld 176 (H) 65 - 99 mg/dL   BUN 21 (H) 6 - 20 mg/dL   Creatinine, Ser 1.23 0.61 - 1.24 mg/dL   Calcium 9.0 8.9 - 10.3 mg/dL   GFR calc non Af Amer 56 (L) >60 mL/min   GFR calc Af Amer >60 >60 mL/min    Comment: (NOTE) The eGFR has been calculated using the CKD EPI equation. This calculation has not been validated in all clinical situations. eGFR's persistently <60 mL/min signify possible Chronic Kidney Disease.    Anion gap 10 5 - 15  Glucose, capillary     Status: Abnormal   Collection Time: 05/31/15 11:25 AM  Result Value Ref Range   Glucose-Capillary 146 (H) 65 - 99 mg/dL  Glucose, capillary     Status: Abnormal   Collection Time: 05/31/15  4:30 PM  Result Value Ref Range   Glucose-Capillary 188 (H) 65 - 99 mg/dL  Glucose, capillary     Status: Abnormal   Collection Time: 05/31/15  9:00 PM  Result Value Ref Range   Glucose-Capillary 119 (H) 65 - 99 mg/dL  Glucose, capillary     Status: Abnormal   Collection Time: 06/01/15  6:58 AM  Result Value Ref Range   Glucose-Capillary 124 (H) 65 - 99 mg/dL  Glucose, capillary     Status: Abnormal   Collection Time: 06/01/15 11:23 AM  Result Value Ref Range   Glucose-Capillary 169 (H) 65 - 99 mg/dL   Glucose, capillary     Status: Abnormal   Collection Time: 06/01/15  4:33 PM  Result Value Ref Range   Glucose-Capillary 105 (H) 65 - 99 mg/dL  Glucose, capillary     Status: Abnormal   Collection Time: 06/01/15  9:02 PM  Result Value Ref Range   Glucose-Capillary 263 (H) 65 - 99 mg/dL   Comment 1 Notify RN   Glucose, capillary     Status: Abnormal   Collection Time: 06/02/15  6:45 AM  Result Value Ref Range   Glucose-Capillary 117 (H) 65 - 99 mg/dL  Glucose, capillary     Status: None   Collection Time: 06/02/15 12:02 PM  Result Value Ref Range   Glucose-Capillary 68 65 - 99 mg/dL  Glucose, capillary     Status: Abnormal   Collection Time: 06/02/15 12:30 PM  Result Value Ref Range   Glucose-Capillary 117 (H) 65 - 99 mg/dL  Glucose, capillary     Status: Abnormal   Collection Time: 06/02/15  4:24 PM  Result Value Ref Range   Glucose-Capillary 202 (H) 65 - 99 mg/dL  Glucose, capillary     Status: Abnormal   Collection Time: 06/02/15  8:45 PM    Result Value Ref Range   Glucose-Capillary 222 (H) 65 - 99 mg/dL   Comment 1 Notify RN   Glucose, capillary     Status: Abnormal   Collection Time: 06/03/15  6:28 AM  Result Value Ref Range   Glucose-Capillary 110 (H) 65 - 99 mg/dL   Comment 1 Notify RN   Creatinine, serum     Status: Abnormal   Collection Time: 06/03/15  6:39 AM  Result Value Ref Range   Creatinine, Ser 1.30 (H) 0.61 - 1.24 mg/dL   GFR calc non Af Amer 53 (L) >60 mL/min   GFR calc Af Amer >60 >60 mL/min    Comment: (NOTE) The eGFR has been calculated using the CKD EPI equation. This calculation has not been validated in all clinical situations. eGFR's persistently <60 mL/min signify possible Chronic Kidney Disease.      HEENT: Left pupil opacified with reduced (?absent) vision Cardio: RRR and no murmur Resp: CTA B/L and unlabored GI: BS positive and no bowel sounds Extremity:  No Edema Skin:   Other loop recorder site with eccyhmosis and minmal  tenderness Neuro: Lethargic, Cranial Nerve Abnormalities Right central 7, Abnormal Sensory withdraws to pinch on right but unable to assess due to aphasia, Abnormal Motor 3- Right delt, bi, tri, 2- R WE, FE, 3- R HF, KE 2- ankle, Dysarthric and Aphasic Musc/Skel:  No pain with LE ROM Gen NAD   Assessment/Plan: 1. Functional deficits secondary to Bilateral ACA/PCA infarct which require 3+ hours per day of interdisciplinary therapy in a comprehensive inpatient rehab setting. Physiatrist is providing close team supervision and 24 hour management of active medical problems listed below. Physiatrist and rehab team continue to assess barriers to discharge/monitor patient progress toward functional and medical goals. FIM: Function - Bathing Position: Shower Body parts bathed by patient: Right arm, Left arm, Chest, Abdomen, Front perineal area, Buttocks, Right upper leg, Left upper leg, Right lower leg, Left lower leg Body parts bathed by helper: Back Assist Level: Set up  Function- Upper Body Dressing/Undressing What is the patient wearing?: Button up shirt Pull over shirt/dress - Perfomed by patient: Thread/unthread right sleeve, Thread/unthread left sleeve, Put head through opening, Pull shirt over trunk Pull over shirt/dress - Perfomed by helper: Thread/unthread right sleeve Button up shirt - Perfomed by patient: Thread/unthread left sleeve, Pull shirt around back Button up shirt - Perfomed by helper: Thread/unthread right sleeve, Button/unbutton shirt Assist Level: Touching or steadying assistance(Pt > 75%) Function - Lower Body Dressing/Undressing What is the patient wearing?: Socks, Shoes, Pants Position: Wheelchair/chair at sink Underwear - Performed by patient: Thread/unthread right underwear leg, Thread/unthread left underwear leg Underwear - Performed by helper: Thread/unthread right underwear leg Pants- Performed by patient: Thread/unthread right pants leg, Thread/unthread left pants  leg, Pull pants up/down, Fasten/unfasten pants Pants- Performed by helper: Thread/unthread right pants leg Socks - Performed by patient: Don/doff right sock, Don/doff left sock Socks - Performed by helper: Don/doff right sock, Don/doff left sock Shoes - Performed by patient: Don/doff right shoe, Don/doff left shoe Shoes - Performed by helper: Fasten right, Fasten left Assist Level: Set up  Function - Toileting Toileting steps completed by patient: Adjust clothing prior to toileting, Performs perineal hygiene, Adjust clothing after toileting Toileting steps completed by helper: Adjust clothing after toileting Toileting Assistive Devices: Grab bar or rail Assist level: Supervision or verbal cues  Function - Toilet Transfers Toilet transfer assistive device: Grab bar Assist level to toilet: Supervision or verbal cues Assist level from toilet: Supervision  or verbal cues  Function - Chair/bed transfer Chair/bed transfer method: Ambulatory Chair/bed transfer assist level: Touching or steadying assistance (Pt > 75%) Chair/bed transfer assistive device: Armrests Chair/bed transfer details: Verbal cues for precautions/safety, Visual cues/gestures for precautions/safety  Function - Locomotion: Wheelchair Will patient use wheelchair at discharge?: No Max wheelchair distance: 150 Assist Level: Supervision or verbal cues Assist Level: Supervision or verbal cues Assist Level: Supervision or verbal cues Function - Locomotion: Ambulation Assistive device: No device Max distance: 150 Assist level: Moderate assist (Pt 50 - 74%) Assist level: Touching or steadying assistance (Pt > 75%) Assist level: Touching or steadying assistance (Pt > 75%) Assist level: Touching or steadying assistance (Pt > 75%) Assist level: Touching or steadying assistance (Pt > 75%)  Function - Comprehension Comprehension: Auditory Comprehension assist level: Understands basic 75 - 89% of the time/ requires cueing 10 -  24% of the time  Function - Expression Expression: Verbal Expression assist level: Expresses basis less than 25% of the time/requires cueing >75% of the time.  Function - Social Interaction Social Interaction assist level: Interacts appropriately 50 - 74% of the time - May be physically or verbally inappropriate.  Function - Problem Solving Problem solving assist level: Solves basic 25 - 49% of the time - needs direction more than half the time to initiate, plan or complete simple activities  Function - Memory Memory assist level: Recognizes or recalls 25 - 49% of the time/requires cueing 50 - 75% of the time Patient normally able to recall (first 3 days only): Staff names and faces, That he or she is in a hospital  Medical Problem List and Plan: 1. 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  DVT Prophylaxis/Anticoagulation: Pharmaceutical: Lovenox 3. Chronic pain/Pain Management: Continue hydrocodone prn for OA/DDD.    4. Adjustment reaction/ Mood: Team to provide ego support.  LCSW to follow for evaluation and support. Continue Xanax bid.  Low dose 5. Neuropsych: This patient is capable of making decisions on his own behalf. 6. Skin/Wound Care: routine pressure relief measures 7. Fluids/Electrolytes/Nutrition: Monitor I/O.75-100% meals  BUN ok no IVF. Push po fluids 8. Acute Renal insufficiency: resolved 9.  CAD/AAA:  Stable on ASA and zocor.   10. DM type 2: Will monitor BS ac/hs. Was on lantus 75 and glipizide at home.  Will cont lantus to 45 units cont meal coverage for now.  Increase glipizide  Am to help with daytime CBGs (used at home). Use SSI for elevated BS.  Overall improving no further changes for now, main elevation at 9p 11. HTN:  Blood pressures  (98/63). Continue to hold lisinopril  and avoid hypotension/hypoperfusion. Offer fluids between meals.     12. Insomnia: should improve with increased activity 13.  "moderate" stool burden suggesting constipation, prophyllactic laxatives, one cont BM 14.  Orthostatic drop in BP no BP meds, BMET showed mild elevation of BUN, fluid intake very good.  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

## 2015-06-03 NOTE — Progress Notes (Signed)
Physical Therapy Session Note  Patient Details  Name: Lucas Morgan MRN: [id number] Date of Birth: 19-Nov-1940  Today's Date: 06/03/2015 PT Individual Time: 1005-1100 PT Individual Time Calculation (min): 55 min   Short Term Goals: Week 1:  PT Short Term Goal 1 (Week 1): Pt will demonstrate attention to R side during 50% of observed functional tasks without cues PT Short Term Goal 2 (Week 1): Pt will transfer with supervision PT Short Term Goal 3 (Week 1): Pt will amb 200' with supervision and LRAD PT Short Term Goal 4 (Week 1): Pt will demonstrate dynamic standing balance grade of F+   Skilled Therapeutic Interventions/Progress Updates:   Session focused on NMR, standing balance, visual scanning, functional strengthening, activity tolerance, and safety awareness. Patient transferred to edge of bed and able to safely reach for shoes on floor to L with supervision, donned shoes with min A to line up heel with shoe and extra time. Gait without AD to gym with supervision overall, decreased R foot clearance but no LOB. Performed NuStep using BUE/BLE at level 6 x 12 min. In stairwell, pt negotiated up/down 22 stairs using L rail (reaching for R rail but not touching railing) with reciprocal pattern, min guard due to decreased safety awareness and impulsivity with fast pace and cues to take rest break at top of stairs due to SOB. Performed Dynavision in standing Mode A: Trial 1 = score of 8, 7.5 avg reaction time, Trial 2 = score of 13, 4.62 avg reaction time, Trial 3 = score of 15, 4.0 avg reaction time, demonstrating consistent improvement in scanning and utilization of BUE reaching. In ADL apartment, performed furniture transfers to low couch with supervision and put pillow cases on 2 pillows for rehab tech. Patient ambulated back to room from ADL apartment while kicking yoga block using RLE to increase RLE clearance with mod A overall and patient frequently kicking floor instead of yoga block. Patient  left sitting in recliner with quick release belt on and all needs within reach.   Therapy Documentation Precautions:  Precautions Precautions: Fall Precaution Comments: expressive aphasia, impulsive Restrictions Weight Bearing Restrictions: No Pain: Pain Assessment Pain Assessment: Faces Faces Pain Scale: Hurts a little bit Pain Type: Chronic pain Pain Location: Back Pain Orientation: Left;Lower Pain Descriptors / Indicators: Aching (pointing to location) Pain Frequency: Intermittent Pain Onset: Unable to tell Pain Intervention(s): Ambulation/increased activity  See Function Navigator for Current Functional Status.   Therapy/Group: Individual Therapy  Kerney Elbe 06/03/2015, 12:54 PM

## 2015-06-03 NOTE — Progress Notes (Signed)
Occupational Therapy Session Note  Patient Details  Name: Lucas Morgan MRN: [id number] Date of Birth: Mar 09, 1941  Today's Date: 06/03/2015 OT Individual Time: 4098-1191 OT Individual Time Calculation (min): 60 min   Short Term Goals: Week 1:  OT Short Term Goal 1 (Week 1): Pt will locate items to the right during self care task with min verbal cues. OT Short Term Goal 2 (Week 1): Pt will perform LB dressing with mod A in order to decrease the level of assist needed for self care.  OT Short Term Goal 3 (Week 1): Pt will perform UB dressing with min A in order to decrease the level of assist needed for self care.  OT Short Term Goal 4 (Week 1): Pt will perform shower transfer with min A onto TTB in order to increase I with functional transfers.  Skilled Therapeutic Interventions/Progress Updates: ADL-retraining with focus on UB dressing, visual scanning to the right, and lower body dressing.    Pt rises from supine in bed to ambulate to bathroom with min instructional cues.   Pt undresses sitting on toilet with min assist while standing to pull pants off his feet.   Pt bathes with setup and min vc for sequencing and then returns to EOB to dress.   Pt dons lower body clothing with min assist to tie right shoe lace and mod assist to don and button shirt.    Pt requires max assist to shave but will groom with setup and min vc to attend to his right when searching for items at sink.    Pt returns to bed at end of session reporting fatigue from activity d/t continued poor sleep pattern.     Therapy Documentation Precautions:  Precautions Precautions: Fall Precaution Comments: expressive aphasia, impulsive Restrictions Weight Bearing Restrictions: No  Pain: Pain Assessment Pain Assessment: Faces Faces Pain Scale: Hurts little more Pain Type: Chronic pain Pain Location: Back Pain Orientation: Lower Pain Descriptors / Indicators: Grimacing Pain Frequency: Intermittent Pain Onset: Unable to  tell Pain Intervention(s): Repositioned;Distraction  See Function Navigator for Current Functional Status.   Therapy/Group: Individual Therapy   Second session: Time: 1130-1200 Time Calculation (min):  30 min  Pain Assessment: No/denies pain  Skilled Therapeutic Interventions: Therapeutic activity with focus on improved coordination and FMC of right hand, improved visual scanning, problem-solving.   Pt presented with task to assemble nuts and bolts presented in large box.   Pt able to select correct bolt/nut pattern 75% but will perseverate with attempting to assemble incorrectly sized or mismatched items.   Pt improved scanning during session and assembled 16 pairs of bolts and nuts of varied sizes while seated at EOB.   See FIM for current functional status  Therapy/Group: Individual Therapy  BARTHOLD,FRANK 06/03/2015, 9:59 AM

## 2015-06-03 NOTE — Progress Notes (Signed)
Speech Language Pathology Daily Session Note  Patient Details  Name: Lucas Morgan MRN: [id number] Date of Birth: [birth year]  Today's Date: 06/03/2015 SLP Individual Time: 1300-1400 SLP Individual Time Calculation (min): 60 min  Short Term Goals: Week 1: SLP Short Term Goal 1 (Week 1): Pt will follow 2-3 step directions with Sueprvision level verbal cues SLP Short Term Goal 2 (Week 1): Pt will utilize pictures and gestures as non-verbal means of communicating basic needs and wants with Mod assist   SLP Short Term Goal 3 (Week 1): Pt will verbally express phrase length utterances durign structured tasks with Mod semantic cues.  SLP Short Term Goal 4 (Week 1): Pt will demonstrate basic problem solving with Min verbal and visual cues SLP Short Term Goal 5 (Week 1): Pt will attend to right upper extremity and environment during structured tasks with Min verbal and visual cues SLP Short Term Goal 6 (Week 1): Pt will request help as needed during self-care tasks with Mod question cues  Skilled Therapeutic Interventions: Skilled treatment session focused on cognitive-linguistic goals.  SLP facilitated session by providing extra time and Mod A sentence completion cues for naming functional items and Max A multimodal cues for phrase level verbal expression in regards to comparing/contrasting 2 items. However, patient demonstrated increased successful verbal expression at the phrase level with Mod A question cues and extra time when discussing biographical information (family, dog, work, Catering manager.).  Patient was transferred back to bed at end of session and required Mod A verbal cues for safety with task. Patient also attended to his RUE throughout the session with Min A verbal cues. Patient left supine in bed with all needs within reach. Continue with current plan of care.    Function:   Cognition Comprehension Comprehension assist level: Understands basic 75 - 89% of the time/ requires cueing 10 - 24% of  the time  Expression   Expression assist level: Expresses basis less than 25% of the time/requires cueing >75% of the time.  Social Interaction Social Interaction assist level: Interacts appropriately 50 - 74% of the time - May be physically or verbally inappropriate.  Problem Solving Problem solving assist level: Solves basic 25 - 49% of the time - needs direction more than half the time to initiate, plan or complete simple activities  Memory Memory assist level: Recognizes or recalls 25 - 49% of the time/requires cueing 50 - 75% of the time    Pain Pain Assessment Pain Assessment: No/denies pain  Therapy/Group: Individual Therapy  Sora Olivo 06/03/2015, 4:35 PM

## 2015-06-04 ENCOUNTER — Inpatient Hospital Stay (HOSPITAL_COMMUNITY): Payer: Medicare Other

## 2015-06-04 ENCOUNTER — Inpatient Hospital Stay (HOSPITAL_COMMUNITY): Payer: Medicare Other | Admitting: Physical Therapy

## 2015-06-04 ENCOUNTER — Inpatient Hospital Stay (HOSPITAL_COMMUNITY): Payer: Medicare Other | Admitting: Speech Pathology

## 2015-06-04 LAB — GLUCOSE, CAPILLARY
GLUCOSE-CAPILLARY: 98 mg/dL (ref 65–99)
GLUCOSE-CAPILLARY: 134 mg/dL — AB (ref 65–99)
Glucose-Capillary: 102 mg/dL — ABNORMAL HIGH (ref 65–99)
Glucose-Capillary: 135 mg/dL — ABNORMAL HIGH (ref 65–99)

## 2015-06-04 MED ORDER — MIRTAZAPINE 7.5 MG PO TABS
7.5000 mg | ORAL_TABLET | Freq: Every day | ORAL | Status: DC
  Administered 2015-06-04 – 2015-06-10 (×7): 7.5 mg via ORAL
  Filled 2015-06-04 (×7): qty 1

## 2015-06-04 NOTE — Progress Notes (Signed)
Occupational Therapy Session Note  Patient Details  Name: ZELMA MAZARIEGO MRN: [id number] Date of Birth: [birth year]  Today's Date: 06/04/2015 OT Individual Time: 0800-0900 OT Individual Time Calculation (min): 60 min    Short Term Goals: Week 1:  OT Short Term Goal 1 (Week 1): Pt will locate items to the right during self care task with min verbal cues. OT Short Term Goal 2 (Week 1): Pt will perform LB dressing with mod A in order to decrease the level of assist needed for self care.  OT Short Term Goal 3 (Week 1): Pt will perform UB dressing with min A in order to decrease the level of assist needed for self care.  OT Short Term Goal 4 (Week 1): Pt will perform shower transfer with min A onto TTB in order to increase I with functional transfers.  Skilled Therapeutic Interventions/Progress Updates:    Pt engaged in BADL retraining with focus on functional amb with RW, task initiation, sequencing, standing balance, and safety awareness.  Pt required HHA for amb to bathroom to shower with sit<>stand from shower seat.  Pt preferred to stand and stood for approx 75% of shower with steady A.  Pt performed dressing tasks when presented with clothing.  Pt required more than a reasonable amount of time to complete tasks with occasional rest breaks.    Therapy Documentation Precautions:  Precautions Precautions: Fall Precaution Comments: expressive aphasia, impulsive Restrictions Weight Bearing Restrictions: No  Pain: Pain Assessment Pain Assessment: No/denies pain  See Function Navigator for Current Functional Status.   Therapy/Group: Individual Therapy  Rich Brave 06/04/2015, 9:00 AM

## 2015-06-04 NOTE — Progress Notes (Signed)
Subjective/Complaints:    Severe aphasia, poor safety awareness   ROS difficult to obtain, does appear accurate with Y/N nods, no breathing issues , no Nausea or vomiting  Objective: Vital Signs: Blood pressure 90/57, pulse 74, temperature 97.8 F (36.6 C), temperature source Oral, resp. rate 16, height _0  (1.676 m), weight 71.714 kg (158 lb 1.6 oz), SpO2 97 %. No results found. Results for orders placed or performed during the hospital encounter of 05/27/15 (from the past 72 hour(s))  Glucose, capillary     Status: Abnormal   Collection Time: 06/01/15 11:23 AM  Result Value Ref Range   Glucose-Capillary 169 (H) 65 - 99 mg/dL  Glucose, capillary     Status: Abnormal   Collection Time: 06/01/15  4:33 PM  Result Value Ref Range   Glucose-Capillary 105 (H) 65 - 99 mg/dL  Glucose, capillary     Status: Abnormal   Collection Time: 06/01/15  9:02 PM  Result Value Ref Range   Glucose-Capillary 263 (H) 65 - 99 mg/dL   Comment 1 Notify RN   Glucose, capillary     Status: Abnormal   Collection Time: 06/02/15  6:45 AM  Result Value Ref Range   Glucose-Capillary 117 (H) 65 - 99 mg/dL  Glucose, capillary     Status: None   Collection Time: 06/02/15 12:02 PM  Result Value Ref Range   Glucose-Capillary 68 65 - 99 mg/dL  Glucose, capillary     Status: Abnormal   Collection Time: 06/02/15 12:30 PM  Result Value Ref Range   Glucose-Capillary 117 (H) 65 - 99 mg/dL  Glucose, capillary     Status: Abnormal   Collection Time: 06/02/15  4:24 PM  Result Value Ref Range   Glucose-Capillary 202 (H) 65 - 99 mg/dL  Glucose, capillary     Status: Abnormal   Collection Time: 06/02/15  8:45 PM  Result Value Ref Range   Glucose-Capillary 222 (H) 65 - 99 mg/dL   Comment 1 Notify RN   Glucose, capillary     Status: Abnormal   Collection Time: 06/03/15  6:28 AM  Result Value Ref Range   Glucose-Capillary 110 (H) 65 - 99 mg/dL   Comment 1 Notify RN   Creatinine, serum     Status: Abnormal   Collection Time: 06/03/15  6:39 AM  Result Value Ref Range   Creatinine, Ser 1.30 (H) 0.61 - 1.24 mg/dL   GFR calc non Af Amer 53 (L) >60 mL/min   GFR calc Af Amer >60 >60 mL/min    Comment: (NOTE) The eGFR has been calculated using the CKD EPI equation. This calculation has not been validated in all clinical situations. eGFR's persistently <60 mL/min signify possible Chronic Kidney Disease.   Glucose, capillary     Status: None   Collection Time: 06/03/15 11:50 AM  Result Value Ref Range   Glucose-Capillary 76 65 - 99 mg/dL  Glucose, capillary     Status: Abnormal   Collection Time: 06/03/15  4:56 PM  Result Value Ref Range   Glucose-Capillary 144 (H) 65 - 99 mg/dL  Glucose, capillary     Status: Abnormal   Collection Time: 06/03/15  8:58 PM  Result Value Ref Range   Glucose-Capillary 211 (H) 65 - 99 mg/dL  Glucose, capillary     Status: Abnormal   Collection Time: 06/04/15  6:51 AM  Result Value Ref Range   Glucose-Capillary 102 (H) 65 - 99 mg/dL     HEENT: Left pupil opacified with reduced (?  absent) vision Cardio: RRR and no murmur Resp: CTA B/L and unlabored GI: BS positive and no bowel sounds Extremity:  No Edema Skin:   Other loop recorder site with eccyhmosis and minmal tenderness Neuro: Lethargic, Cranial Nerve Abnormalities Right central 7, Abnormal Sensory withdraws to pinch on right but unable to assess due to aphasia, Abnormal Motor 3- Right delt, bi, tri, 2- R WE, FE, 3- R HF, KE 2- ankle, Dysarthric and Aphasic Musc/Skel:  No pain with LE ROM Gen NAD   Assessment/Plan: 1. Functional deficits secondary to Bilateral ACA/PCA infarct which require 3+ hours per day of interdisciplinary therapy in a comprehensive inpatient rehab setting. Physiatrist is providing close team supervision and 24 hour management of active medical problems listed below. Physiatrist and rehab team continue to assess barriers to discharge/monitor patient progress toward functional and  medical goals. Team conference today please see physician documentation under team conference tab, met with team face-to-face to discuss problems,progress, and goals. Formulized individual treatment plan based on medical history, underlying problem and comorbidities. FIM: Function - Bathing Position: Shower Body parts bathed by patient: Right arm, Left arm, Chest, Abdomen, Front perineal area, Buttocks, Left upper leg, Right upper leg, Right lower leg, Left lower leg Body parts bathed by helper: Back Assist Level: Touching or steadying assistance(Pt > 75%)  Function- Upper Body Dressing/Undressing What is the patient wearing?: Button up shirt Pull over shirt/dress - Perfomed by patient: Thread/unthread right sleeve, Thread/unthread left sleeve, Put head through opening, Pull shirt over trunk Pull over shirt/dress - Perfomed by helper: Thread/unthread right sleeve Button up shirt - Perfomed by patient: Thread/unthread left sleeve, Pull shirt around back Button up shirt - Perfomed by helper: Thread/unthread right sleeve, Button/unbutton shirt Assist Level: Touching or steadying assistance(Pt > 75%) Function - Lower Body Dressing/Undressing What is the patient wearing?: Socks, Shoes, Underwear, Pants Position: Sitting EOB Underwear - Performed by patient: Thread/unthread right underwear leg, Thread/unthread left underwear leg, Pull underwear up/down Underwear - Performed by helper: Thread/unthread right underwear leg Pants- Performed by patient: Thread/unthread right pants leg, Thread/unthread left pants leg, Pull pants up/down, Fasten/unfasten pants Pants- Performed by helper: Thread/unthread right pants leg Socks - Performed by patient: Don/doff right sock, Don/doff left sock Socks - Performed by helper: Don/doff right sock, Don/doff left sock Shoes - Performed by patient: Don/doff right shoe, Don/doff left shoe, Fasten left Shoes - Performed by helper: Fasten right Assist Level: Touching  or steadying assistance (Pt > 75%)  Function - Toileting Toileting steps completed by patient: Adjust clothing prior to toileting, Performs perineal hygiene, Adjust clothing after toileting Toileting steps completed by helper: Adjust clothing after toileting Toileting Assistive Devices: Grab bar or rail Assist level: Supervision or verbal cues  Function - Toilet Transfers Toilet transfer assistive device: Grab bar Assist level to toilet: Supervision or verbal cues Assist level from toilet: Supervision or verbal cues  Function - Chair/bed transfer Chair/bed transfer method: Ambulatory Chair/bed transfer assist level: Supervision or verbal cues Chair/bed transfer assistive device: Armrests Chair/bed transfer details: Verbal cues for precautions/safety, Visual cues/gestures for precautions/safety  Function - Locomotion: Wheelchair Will patient use wheelchair at discharge?: No Max wheelchair distance: 150 Assist Level: Supervision or verbal cues Assist Level: Supervision or verbal cues Assist Level: Supervision or verbal cues Function - Locomotion: Ambulation Assistive device: No device Max distance: 200 ft Assist level: Touching or steadying assistance (Pt > 75%) Assist level: Touching or steadying assistance (Pt > 75%) Assist level: Touching or steadying assistance (Pt > 75%) Assist level:  Touching or steadying assistance (Pt > 75%) Assist level: Touching or steadying assistance (Pt > 75%)  Function - Comprehension Comprehension: Auditory Comprehension assist level: Understands basic 75 - 89% of the time/ requires cueing 10 - 24% of the time  Function - Expression Expression: Verbal Expression assist level: Expresses basis less than 25% of the time/requires cueing >75% of the time.  Function - Social Interaction Social Interaction assist level: Interacts appropriately 50 - 74% of the time - May be physically or verbally inappropriate.  Function - Problem Solving Problem  solving assist level: Solves basic 25 - 49% of the time - needs direction more than half the time to initiate, plan or complete simple activities  Function - Memory Memory assist level: Recognizes or recalls 25 - 49% of the time/requires cueing 50 - 75% of the time Patient normally able to recall (first 3 days only): Staff names and faces, That he or she is in a hospital  Medical Problem List and Plan: 1. 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  DVT Prophylaxis/Anticoagulation: Pharmaceutical: Lovenox 3. Chronic pain/Pain Management: Continue hydrocodone prn for OA/DDD.    4. Adjustment reaction/ Mood: Team to provide ego support.  LCSW to follow for evaluation and support. Continue Xanax bid.  Low dose 5. Neuropsych: This patient is capable of making decisions on his own behalf. 6. Skin/Wound Care: routine pressure relief measures 7. Fluids/Electrolytes/Nutrition: Monitor I/O.75-100% meals  BUN ok no IVF. Push po fluids 8. Acute Renal insufficiency: resolved 9.  CAD/AAA:  Stable on ASA and zocor.   10. DM type 2: Will monitor BS ac/hs. Was on lantus 75 and glipizide at home.  Will cont lantus to 45 units cont meal coverage for now.  Increase glipizide  Am to help with daytime CBGs (used at home). Use SSI for elevated BS.  Overall improving no further changes for now, main elevation at 9p 11. HTN:  Blood pressures  (90/57). Continue to hold lisinopril and avoid hypotension/hypoperfusion. Offer fluids between meals.     12. Insomnia: should improve with increased activity 13.  "moderate" stool burden suggesting constipation, prophyllactic laxatives, one cont BM 14.  Orthostatic drop in BP no BP meds, BMET showed mild elevation of BUN, fluid intake very good.  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

## 2015-06-04 NOTE — Patient Care Conference (Signed)
Inpatient RehabilitationTeam Conference and Plan of Care Update Date: 06/04/2015   Time: 11;30 AM    Patient Name: Lucas Morgan      Medical Record Number: [id number]  Date of Birth: 12-31-1940 Sex: Male         Room/Bed: 4M07C/4M07C-01 Payor Info: Payor: [employer] / Plan: [employer] MEDICARE / Product Type: *No Product type* /    Admitting Diagnosis: cva  Admit Date/Time:  05/27/2015  6:25 PM Admission Comments: No comment available   Primary Diagnosis:  Stroke, acute, embolic Principal Problem: Stroke, acute, embolic  Patient Active Problem List   Diagnosis Date Noted  . Cardiomyopathy   . Aphasia S/P CVA 05/28/2015  . Right hemiparesis 05/28/2015  . Stroke, acute, embolic 05/27/2015  . Carotid stenosis   . Stroke 05/24/2015  . 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 Acute on chronic renal failure 05/24/2015  . CVA (cerebral infarction) 05/24/2015  . Leukocytosis 05/24/2015  . Foreign body in colon 04/08/2015  . Spinal stenosis of lumbar region 10/18/2014  . Lumbar stenosis with neurogenic claudication 10/18/2014  . Thyroid nodule 03/12/2013  . AAA (abdominal aortic aneurysm) 03/12/2013  . PULMONARY NODULE 08/14/2010  . COUGH 06/30/2010  . HYPOTENSION 05/04/2010  . AODM 01/27/2009  . HYPERCHOLESTEROLEMIA  IIA 01/27/2009  . CAD, NATIVE VESSEL 12/26/2008    Expected Discharge Date: Expected Discharge Date: 06/11/15  Team Members Present: Physician leading conference: Dr. Claudette Laws Social Worker Present: Dossie Der, LCSW Nurse Present: Carmie End, RN PT Present: Laurin Coder, PT OT Present: Roney Mans, OT SLP Present: Jackalyn Lombard, SLP PPS Coordinator present : Tora Duck, RN, CRRN     Current Status/Progress Goal Weekly Team Focus  Medical   poor safety awareness  call for assist  improve safety awareness   Bowel/Bladder   Continent to bowel and bladder.LBM 9/20  Min. A.  Assess bowel and bladder regimen.Continue toiletting Q 2-3  hrs. and PRN   Swallow/Nutrition/ Hydration     na        ADL's   Overall supervision for transfers/mobility, min assist for dynamic standing balance, upper and lower body dressing d/t incoordination at RUE and decreased right hand FMC  Overall Supervision for BADL  NMR and strengthening of RUE, improved attention (to right), adapted bathing/dressing skills, dynamic standing balance.   Mobility   steadying assist - supervision  supervision  activity tolerance, safety awareness, high level balance, use of R arm with functional mobility   Communication   Supervision for comprehension of basic information, Max A for verbal expression of wants/needs  Supervision for comprehension of complex information, Min-Mod A for functional communication/verbal expression  phrase level verbal expression, following mildly complex commands    Safety/Cognition/ Behavioral Observations  Min A for attention, Mod A for problem solving   Supervision   problem solving, attention, awareness   Pain   Chronic back pain.Norco prn q 4.  Less than 3,on 1 to 10 scale.  Assess for pain levels Q 2-3 hrs,and PRN   Skin   Clean and intact.  To keep free from breakdown with min A.  To assess skin Q shift and PRN      *See Care Plan and progress notes for long and short-term goals.  Barriers to Discharge: communication cognition    Possible Resolutions to Barriers:  Cont rehab    Discharge Planning/Teaching Needs:  Pt doing well, family aware he will need 24 hr supervision for safety due to speech deficits.  Team Discussion:  Goals of supervision level due to speech issues and balance issues, also impulsive. Poor right side awareness-which high risk of falls. Regular diet with thin liquids. Cont B & B-timed tolieting. Chronic back pain being managed. Sleep graph initiated  Revisions to Treatment Plan:  None   Continued Need for Acute Rehabilitation Level of Care: The patient requires daily medical management by  a physician with specialized training in physical medicine and rehabilitation for the following conditions: Daily direction of a multidisciplinary physical rehabilitation program to ensure safe treatment while eliciting the highest outcome that is of practical value to the patient.: Yes Daily medical management of patient stability for increased activity during participation in an intensive rehabilitation regime.: Yes Daily analysis of laboratory values and/or radiology reports with any subsequent need for medication adjustment of medical intervention for : Neurological problems  Dupree, Lemar Livings 06/04/2015, 2:59 PM

## 2015-06-04 NOTE — Progress Notes (Signed)
Speech Language Pathology Weekly Progress and Session Note  Patient Details  Name: Lucas Morgan MRN: [id number] Date of Birth: [birth year]  Beginning of progress report period: May 27, 2015 End of progress report period: June 04, 2015  Today's Date: 06/04/2015 SLP Individual Time: 1100-1200 SLP Individual Time Calculation (min): 60 min  Short Term Goals: Week 1: SLP Short Term Goal 1 (Week 1): Pt will follow 2-3 step directions with Sueprvision level verbal cues SLP Short Term Goal 1 - Progress (Week 1): Not met SLP Short Term Goal 2 (Week 1): Pt will utilize pictures and gestures as non-verbal means of communicating basic needs and wants with Mod assist   SLP Short Term Goal 2 - Progress (Week 1): Met SLP Short Term Goal 3 (Week 1): Pt will verbally express phrase length utterances durign structured tasks with Mod semantic cues.  SLP Short Term Goal 3 - Progress (Week 1): Not met SLP Short Term Goal 4 (Week 1): Pt will demonstrate basic problem solving with Min verbal and visual cues SLP Short Term Goal 4 - Progress (Week 1): Not met SLP Short Term Goal 5 (Week 1): Pt will attend to right upper extremity and environment during structured tasks with Min verbal and visual cues SLP Short Term Goal 5 - Progress (Week 1): Not met SLP Short Term Goal 6 (Week 1): Pt will request help as needed during self-care tasks with Mod question cues SLP Short Term Goal 6 - Progress (Week 1): Not met    New Short Term Goals: Week 2: SLP Short Term Goal 1 (Week 2): Pt will attend to right upper extremity and environment during structured tasks with Min verbal and visual cues SLP Short Term Goal 2 (Week 2): Pt will request help as needed during self-care tasks with Mod question cues SLP Short Term Goal 3 (Week 2): Pt will demonstrate basic problem solving with Min verbal and visual cues SLP Short Term Goal 4 (Week 2): Pt will utilize pictures and gestures as non-verbal means of communicating  basic needs and wants with Min assist   SLP Short Term Goal 5 (Week 2): Pt will follow 2-3 step directions with Sueprvision level verbal cues SLP Short Term Goal 6 (Week 2): Pt will verbally express phrase length utterances during structured tasks with Mod semantic cues.   Weekly Progress Updates: Patient has made minimal gains and has met 1 of 6 STG's this reporting due to increased communication of wants/needs.  Currently, patient continues to require total-Max A for verbal expression of wants/needs, however, suspect patient's moderate-severe dysarthria is also impacting his overall functional communication. Patient is also utilizing gestures for expression of wants/needs with Mod-Max A multimodal cues. Patient can consistently follow 1 step commands and his overall comprehension of basic information appears intact. Patient also requires overall Mod A multimodal cues for functional problem solving, attention and awareness of verbal errors. Patient and family education is ongoing. Patient would benefit from continued skilled SLP intervention to maximize his cognitive-linguistic function and overall functional independence.    Intensity: Minumum of 1-2 x/day, 30 to 90 minutes Frequency: 3 to 5 out of 7 days Duration/Length of Stay: 06/11/15 Treatment/Interventions: Cognitive remediation/compensation;Cueing hierarchy;Environmental controls;Functional tasks;Patient/family education;Speech/Language facilitation;Therapeutic Activities;Internal/external aids   Daily Session  Skilled Therapeutic Interventions: Skilled treatment session focused on cognitive-lingustic goals. Upon arrival, patient was asleep in bed but was easily awakened and agreeable to participate. SLP facilitated session by providing Min A verbal cues for problem solving and safety during bathroom and self-care tasks.  Patient also required Max A multimodal cues to attend to RUE, Mod A verbal and visual cues to scan to right field of  environment and Min A verbal cues for problem solving during a basic naming task. Patient was able to name functional items with extra time and Min A verbal and semantic cues. Patient perseverative on pain in his LUE and reported he had cut his hand on glass prior to hospitalization and feels there may be a piece in his hand. RN made aware. Patinet left upright in wheelchair with quick release belt in place and all needs within reach. Continue with current plan of care.       Function:   Cognition Comprehension Comprehension assist level: Understands basic 75 - 89% of the time/ requires cueing 10 - 24% of the time  Expression   Expression assist level: Expresses basis less than 25% of the time/requires cueing >75% of the time.  Social Interaction Social Interaction assist level: Interacts appropriately 50 - 74% of the time - May be physically or verbally inappropriate.  Problem Solving Problem solving assist level: Solves basic 25 - 49% of the time - needs direction more than half the time to initiate, plan or complete simple activities  Memory Memory assist level: Recognizes or recalls 25 - 49% of the time/requires cueing 50 - 75% of the time   Pain Reports back pain, RN made aware. Patient premedicated and repositioned   Therapy/Group: Individual Therapy  PAYNE, COURTNEY 06/04/2015, 4:09 PM

## 2015-06-04 NOTE — Progress Notes (Signed)
Pt did not sleep well last night. Refused condom cath. 2 voids overnight. Complaints of hand pain and back pain. Expressed frustration with lack of sleep, current condition. Stated "I'd rather be dead." Assessed for suicidal ideation. Pt denies, but feels sad and tired. Emotional support given. Video related to prayer played. Will report to oncoming RN.

## 2015-06-04 NOTE — Progress Notes (Signed)
Physical Therapy Weekly Progress Note  Patient Details  Name: Lucas Morgan MRN: [id number] Date of Birth: [birth year]  Beginning of progress report period: May 27, 2015 End of progress report period: June 04, 2015  Today's Date: 06/04/2015 PT Individual Time: 0930-1030 PT Individual Time Calculation (min): 60 min   Patient has met 3 of 4 short term goals.  Pt's balance has improved over this past week, evidenced by a 14 point increase on the [employer], but he continues to demonstrate impulsiveness, significant aphasia, balance deficits, and problems with safety awareness, necessitating further time in IPR.   Patient continues to demonstrate the following deficits: impaired safety awareness, impaired high level balance, significant aphasia, limited activity tolerance and therefore will continue to benefit from skilled PT intervention to enhance overall performance with activity tolerance, balance, ability to compensate for deficits, functional use of  right upper extremity and right lower extremity, attention, awareness and coordination.  Patient progressing toward long term goals..  Continue plan of care.  PT Short Term Goals Week 1:  PT Short Term Goal 1 (Week 1): Pt will demonstrate attention to R side during 50% of observed functional tasks without cues PT Short Term Goal 1 - Progress (Week 1): Not met PT Short Term Goal 2 (Week 1): Pt will transfer with supervision PT Short Term Goal 2 - Progress (Week 1): Met PT Short Term Goal 3 (Week 1): Pt will amb 200' with supervision and LRAD PT Short Term Goal 3 - Progress (Week 1): Met PT Short Term Goal 4 (Week 1): Pt will demonstrate dynamic standing balance grade of F+ PT Short Term Goal 4 - Progress (Week 1): Met Week 2:  PT Short Term Goal 1 (Week 2): STGs = LTGs due to ELOS  Skilled Therapeutic Interventions/Progress Updates:    Pt received napping in recliner with quick release belt in place - agreeable to PT. Gait  Training - see function tab for ambulation ability without AD. PT trials pt ambulating with RW and notes that pt intermittently picks up RW during turns and R hand falls under handhold due to inattention and supination synergy. PT trials ambulation with 4WW/Rollator and notes improved R hand placement, pt demonstrates good use of driving walker against wall prior to sitting down for safety (after demonstration), and improved balance to grossly SBA with use of this AD (no intermittent steadying assist req). Pt may need a home evaluation to assess if width of 4WW is practical for in home use. Pt has steps to enter/exit home and reports a ramp cannot be built, so daughter would need to be willing and able to get 4WW up/down stairs for community events such as medical appointments. Neuromuscular Reeducation: PT administers Berg Balance Test and notes pt scores 45/56, indicating a 14 point improvement in the past week in balance. Pt ended in bed with 2 rails up and bed alarm on. Pt will benefit from continued safety awareness, high level balance, functional mobility, and activity tolerance training. Continue per PT POC.   Therapy Documentation Precautions:  Precautions Precautions: Fall Precaution Comments: expressive aphasia, impulsive Restrictions Weight Bearing Restrictions: No Pain: Pain Assessment Pain Assessment: Faces Faces Pain Scale: Hurts even more Pain Type: Chronic pain Pain Location: Back Pain Orientation: Lower Pain Descriptors / Indicators: Sore Pain Onset: Gradual Pain Intervention(s): Rest;Repositioned Multiple Pain Sites: No Balance: Balance Balance Assessed: Yes Standardized Balance Assessment Standardized Balance Assessment: Berg Balance Test Berg Balance Test Sit to Stand: Able to stand without using hands and  stabilize independently Standing Unsupported: Able to stand safely 2 minutes Sitting with Back Unsupported but Feet Supported on Floor or Stool: Able to sit safely  and securely 2 minutes Stand to Sit: Sits safely with minimal use of hands Transfers: Able to transfer with verbal cueing and /or supervision Standing Unsupported with Eyes Closed: Able to stand 10 seconds safely Standing Ubsupported with Feet Together: Able to place feet together independently and stand for 1 minute with supervision From Standing, Reach Forward with Outstretched Arm: Can reach confidently >25 cm (10") From Standing Position, Pick up Object from Floor: Able to pick up shoe, needs supervision From Standing Position, Turn to Look Behind Over each Shoulder: Looks behind from both sides and weight shifts well Turn 360 Degrees: Able to turn 360 degrees safely but slowly Standing Unsupported, Alternately Place Feet on Step/Stool: Able to stand independently and complete 8 steps >20 seconds Standing Unsupported, One Foot in Front: Able to plae foot ahead of the other independently and hold 30 seconds Standing on One Leg: Tries to lift leg/unable to hold 3 seconds but remains standing independently Total Score: 45  See Function Navigator for Current Functional Status.  Therapy/Group: Individual Therapy  HYSLOP,AMANDA M 06/04/2015, 10:28 AM

## 2015-06-04 NOTE — Progress Notes (Signed)
Physical Therapy Session Note  Patient Details  Name: Lucas Morgan MRN: [id number] Date of Birth: 02-17-1941  Today's Date: 06/04/2015 PT Individual Time: 1430-1500 PT Individual Time Calculation (min): 30 min   Short Term Goals: Week 2:  PT Short Term Goal 1 (Week 2): STGs = LTGs due to ELOS  Skilled Therapeutic Interventions/Progress Updates:    Pt received supine in bed and agreeable to therapy.  Pt amb to therapy gym with close supervision and intermittent steady A due to impulsiveness.  PT instructed patient in partial OTAGO C exercises (sit<>stand, minisquats, side stepping, heel walking, and toe walking) 2x10 reps each exercise.  Pt amb back to room at end of session and positioned in bed with call bell in reach and needs met.    Therapy Documentation Precautions:  Precautions Precautions: Fall Precaution Comments: expressive aphasia, impulsive Restrictions Weight Bearing Restrictions: No Pain: Pain Assessment Pain Assessment: No/denies pain   See Function Navigator for Current Functional Status.   Therapy/Group: Individual Therapy  Lucas Morgan 06/04/2015, 4:57 PM

## 2015-06-04 NOTE — Progress Notes (Signed)
Social Work Patient ID: Lucas Morgan, male   DOB: 04/02/1941, 74 y.o.   MRN: [id number] Have left message for daughter-[name] to inform of team conference-supervision level goals and discharge 9/28.  She is aware of his communication issues and  Balance issues.  Will await her return call to answer any questions or concerns. Work toward discharge date.

## 2015-06-05 ENCOUNTER — Inpatient Hospital Stay (HOSPITAL_COMMUNITY): Payer: Medicare Other | Admitting: Physical Therapy

## 2015-06-05 ENCOUNTER — Inpatient Hospital Stay (HOSPITAL_COMMUNITY): Payer: Medicare Other

## 2015-06-05 ENCOUNTER — Ambulatory Visit: Payer: Medicare Other

## 2015-06-05 ENCOUNTER — Inpatient Hospital Stay (HOSPITAL_COMMUNITY): Payer: Medicare Other | Admitting: Speech Pathology

## 2015-06-05 ENCOUNTER — Inpatient Hospital Stay (HOSPITAL_COMMUNITY): Payer: Medicare Other | Admitting: Occupational Therapy

## 2015-06-05 LAB — GLUCOSE, CAPILLARY
GLUCOSE-CAPILLARY: 176 mg/dL — AB (ref 65–99)
Glucose-Capillary: 78 mg/dL (ref 65–99)
Glucose-Capillary: 73 mg/dL (ref 65–99)
Glucose-Capillary: 60 mg/dL — ABNORMAL LOW (ref 65–99)
Glucose-Capillary: 93 mg/dL (ref 65–99)

## 2015-06-05 MED ORDER — HYDROCODONE-ACETAMINOPHEN 10-325 MG PO TABS
1.0000 | ORAL_TABLET | Freq: Three times a day (TID) | ORAL | Status: DC
  Administered 2015-06-05 – 2015-06-10 (×20): 1 via ORAL
  Filled 2015-06-05 (×20): qty 1

## 2015-06-05 NOTE — Progress Notes (Signed)
Speech Language Pathology Daily Session Note  Patient Details  Name: Lucas Morgan MRN: [id number] Date of Birth: 1940-12-20  Today's Date: 06/05/2015 SLP Individual Time: 7829-5621 SLP Individual Time Calculation (min): 57 min  Short Term Goals: Week 2: SLP Short Term Goal 1 (Week 2): Pt will attend to right upper extremity and environment during structured tasks with Min verbal and visual cues SLP Short Term Goal 2 (Week 2): Pt will request help as needed during self-care tasks with Mod question cues SLP Short Term Goal 3 (Week 2): Pt will demonstrate basic problem solving with Min verbal and visual cues SLP Short Term Goal 4 (Week 2): Pt will utilize pictures and gestures as non-verbal means of communicating basic needs and wants with Min assist   SLP Short Term Goal 5 (Week 2): Pt will follow 2-3 step directions with Sueprvision level verbal cues SLP Short Term Goal 6 (Week 2): Pt will verbally express phrase length utterances during structured tasks with Mod semantic cues.   Skilled Therapeutic Interventions:  Pt was seen for skilled ST targeting cognitive-linguistic goals.  Upon arrival, pt was partially reclined in bed, awake, alert, and agreeable to participate in ST.   Pt's daughter was present briefly at the beginning of today's session but had to leave early to go to work.  Pt was transferred to wheelchair to maximize attention and alertness during structured tasks.  Pt required mod-max cues to place 3 step picture cards in sequential order due to decreased functional problem solving and awareness of errors.  However, pt was able to increase vocal intensity and reduce rate of speech with mod assist multimodal cues for ~60% intelligibility during a basic barrier task targeting use of functional communication strategies.  Pt was also noted with improved regulation and correction of verbal errors during task with the abovementioned interventions.  Pt was left with a simple picture as a  visual aid to facilitate recall of slow rate of speech to improve communication with staff members.  Pt was returned to room and left in bed with call bell within reach.  Continue per current plan of care.   Function:  Eating Eating   Modified Consistency Diet: No Eating Assist Level: Set up assist for;Supervision or verbal cues   Eating Set Up Assist For: Opening containers       Cognition Comprehension Comprehension assist level: Understands basic 75 - 89% of the time/ requires cueing 10 - 24% of the time  Expression   Expression assist level: Expresses basis less than 25% of the time/requires cueing >75% of the time.  Social Interaction Social Interaction assist level: Interacts appropriately 50 - 74% of the time - May be physically or verbally inappropriate.  Problem Solving Problem solving assist level: Solves basic 25 - 49% of the time - needs direction more than half the time to initiate, plan or complete simple activities  Memory Memory assist level: Recognizes or recalls 25 - 49% of the time/requires cueing 50 - 75% of the time    Pain Pain Assessment Pain Assessment: No/denies pain   Therapy/Group: Individual Therapy  Jeffree Cazeau, Melanee Spry 06/05/2015, 12:55 PM

## 2015-06-05 NOTE — Progress Notes (Signed)
Occupational Therapy Session Note  Patient Details  Name: Lucas Morgan MRN: [id number] Date of Birth: Dec 25, 1940  Today's Date: 06/05/2015 OT Individual Time: 1430-1511 OT Individual Time Calculation (min): 41 min    Short Term Goals: Week 1:  OT Short Term Goal 1 (Week 1): Pt will locate items to the right during self care task with min verbal cues. OT Short Term Goal 2 (Week 1): Pt will perform LB dressing with mod A in order to decrease the level of assist needed for self care.  OT Short Term Goal 3 (Week 1): Pt will perform UB dressing with min A in order to decrease the level of assist needed for self care.  OT Short Term Goal 4 (Week 1): Pt will perform shower transfer with min A onto TTB in order to increase I with functional transfers.  Skilled Therapeutic Interventions/Progress Updates:  Upon entering the room, pt supine in bed with no signs,symptoms, or c/o pain this session. Pt agreeable to OT intervention. Pt sitting on EOB and donning B shoes with steady assist for balance during anterior weight shift. Pt ambulating in hallway 100' to kitchen with close supervision and no use of AD. Once seated at table top, pt scanning table to locate specific cards as therapist calls them out. Pt requiring mod verbal cues to scan to the R during this session. Once located, pt must pick up R UE and place onto card and slide to therapist. Pt also at times picking up card with L hand and placing into R in order to reach across midline to hand cards to therapist. Pt returning to room for toileting with steady assist for stand pivot transfer and during clothing management. Pt able to tie pant fasteners after toileting with B hands, increased time, and multiple tries.  Pt returning to supine with supervision. Bed alarm activated. Call bell and all needed items within reach upon exiting the room.   Therapy Documentation Precautions:  Precautions Precautions: Fall Precaution Comments: expressive  aphasia, impulsive Restrictions Weight Bearing Restrictions: No Vital Signs: Therapy Vitals Temp: 98.6 F (37 C) Temp Source: Oral Pulse Rate: 100 Resp: 18 BP: 100/62 mmHg Patient Position (if appropriate): Lying Oxygen Therapy SpO2: 94 % Pain: Pain Assessment Pain Assessment: Faces Pain Score: 2  Pain Type: Acute pain Pain Location: Knee Pain Orientation: Left Pain Descriptors / Indicators: Sore Pain Onset: Gradual Pain Intervention(s): Other (Comment) (gentle open chain knee rom) Multiple Pain Sites: No  See Function Navigator for Current Functional Status.   Therapy/Group: Individual Therapy  Lowella Grip 06/05/2015, 3:16 PM

## 2015-06-05 NOTE — Progress Notes (Signed)
Physical Therapy Session Note  Patient Details  Name: Lucas Morgan MRN: [id number] Date of Birth: 02/10/1941  Today's Date: 06/05/2015 PT Individual Time: 1300-1330 PT Individual Time Calculation (min): 30 min   Short Term Goals: Week 2:  PT Short Term Goal 1 (Week 2): STGs = LTGs due to ELOS  Skilled Therapeutic Interventions/Progress Updates:    Pt received in bed, agreeable to PT session. Therapeutic Activity - see function tab for bed mobility and transfer details. PT hands pt his shoes and pt takes significant amount of time donning and trying to tie shoelaces. PT gives pt elastic shoelaces and demonstrates how to use them, and places them in pt's shoes. Pt reports much easier use with these. Neuromuscular Reeducation - PT instructs pt in high level balance activity, dancing with PT to waltz music: forward walking, backwards walking, sideways walking, turning each direction. Pt req prolonged rest break after dancing due to fatigue and HR up. Pt ended in bed, resting in room. Continue per PT pOC.   Therapy Documentation Precautions:  Precautions Precautions: Fall Precaution Comments: expressive aphasia, impulsive Restrictions Weight Bearing Restrictions: No Vital Signs: Therapy Vitals Pulse Rate: (!) 120 (after dancing with PT) BP: 140/76 mmHg (after dancing with PT) Patient Position (if appropriate): Sitting Oxygen Therapy SpO2: 97 % Pain: Pain Assessment Pain Assessment: Faces Pain Score: 2  Faces Pain Scale: Hurts even more Pain Type: Acute pain Pain Location: Knee Pain Orientation: Left Pain Descriptors / Indicators: Sore Pain Frequency: Constant Pain Onset: Gradual Patients Stated Pain Goal: 3 Pain Intervention(s): Other (Comment) (gentle open chain knee rom) Multiple Pain Sites: No   See Function Navigator for Current Functional Status.   Therapy/Group: Individual Therapy  HYSLOP,AMANDA M 06/05/2015, 1:22 PM

## 2015-06-05 NOTE — Progress Notes (Signed)
Occupational Therapy Weekly Progress Note  Patient Details  Name: Lucas Morgan MRN: [id number] Date of Birth: May 04, 1941  Beginning of progress report period: May 29, 2015 End of progress report period: June 05, 2015  Today's Date: 06/05/2015 OT Individual Time: 0375-4360 OT Individual Time Calculation (min): 40 min    Patient has met 4 of 4 short term goals.  Pt is progressing well functional skills however requires close supervision d/t impulsivity and decreased communication.  Patient continues to demonstrate the following deficits: Impaired activity tolerance, impaired dynamic standing balance, visual impairment, impaired functional communication, impaired attention, and impaired awareness and therefore will continue to benefit from skilled OT intervention to enhance overall performance with BADL.  Patient progressing toward long term goals..  Continue plan of care.  OT Short Term Goals Week 1:  OT Short Term Goal 1 (Week 1): Pt will locate items to the right during self care task with min verbal cues. OT Short Term Goal 1 - Progress (Week 1): Met OT Short Term Goal 2 (Week 1): Pt will perform LB dressing with mod A in order to decrease the level of assist needed for self care.  OT Short Term Goal 2 - Progress (Week 1): Met OT Short Term Goal 3 (Week 1): Pt will perform UB dressing with min A in order to decrease the level of assist needed for self care.  OT Short Term Goal 3 - Progress (Week 1): Met OT Short Term Goal 4 (Week 1): Pt will perform shower transfer with min A onto TTB in order to increase I with functional transfers. OT Short Term Goal 4 - Progress (Week 1): Met Week 2:  OT Short Term Goal 1 (Week 2): STG=LTG d/t short LOS  Skilled Therapeutic Interventions/Progress Updates: Therapeutic activity with focus on functional communication, RUE [place], pain management.   Pt received seated in w/c with QRB applied.  Pt lethargic but responsive to therapist.  Pt  declines bathing or grooming but removes sock and presents his left foot to therapist while grimacing and attempting verbalizations.   After repeated attempts to interpret pt's complaint, pt acknowledged that both his feet felt very cold.   OT applied multiple layers of socks which appeared to resolve symptom.   Pt then engaged in seated activity of cleaning and polishing his shoes using both hands.   Pt sustained attention for approx 20 minutes and gestured additional complaint of discomfort at his stomach while acknowledging that he wanted to return to bed to rest.   Pt assisted to bed with supervision however pt repeated that he wanted to contact his daughter Lucas Morgan.   Therapist attempted call for pt however his residence is not within local call distance.   Plan to relay request to social worker, as feasible.   RN notified of pt's missed time in therapy d/t discomfort and fatigue.     Therapy Documentation Precautions:  Precautions Precautions: Fall Precaution Comments: expressive aphasia, impulsive Restrictions Weight Bearing Restrictions: No   General: General OT Amount of Missed Time: 20 Minutes  Vital Signs: Therapy Vitals Temp: 98.6 F (37 C) Temp Source: Oral Pulse Rate: 100 Resp: 18 BP: 100/62 mmHg Patient Position (if appropriate): Lying Oxygen Therapy SpO2: 94 %   Pain: 8/10 (face scale), abdomin, attempted distraction however pt ambulated to bed and ceased participation, closing his eyes after laying down.  See Function Navigator for Current Functional Status.  Therapy/Group: Individual Therapy  [place] 06/05/2015, 3:23 PM

## 2015-06-05 NOTE — Progress Notes (Signed)
Subjective/Complaints:    Severe aphasia, With y/n and gestures pt shows me his RIght foot and says cold OT notes that pt has c/o low back pain   ROS difficult to obtain, does appear accurate with Y/N nods, no breathing issues , no Nausea or vomiting  Objective: Vital Signs: Blood pressure 148/78, pulse 77, temperature 97.5 F (36.4 C), temperature source Oral, resp. rate 18, height 5' 6"  (1.676 m), weight 73.936 kg (163 lb), SpO2 96 %. No results found. Results for orders placed or performed during the hospital encounter of 05/27/15 (from the past 72 hour(s))  Glucose, capillary     Status: None   Collection Time: 06/02/15 12:02 PM  Result Value Ref Range   Glucose-Capillary 68 65 - 99 mg/dL  Glucose, capillary     Status: Abnormal   Collection Time: 06/02/15 12:30 PM  Result Value Ref Range   Glucose-Capillary 117 (H) 65 - 99 mg/dL  Glucose, capillary     Status: Abnormal   Collection Time: 06/02/15  4:24 PM  Result Value Ref Range   Glucose-Capillary 202 (H) 65 - 99 mg/dL  Glucose, capillary     Status: Abnormal   Collection Time: 06/02/15  8:45 PM  Result Value Ref Range   Glucose-Capillary 222 (H) 65 - 99 mg/dL   Comment 1 Notify RN   Glucose, capillary     Status: Abnormal   Collection Time: 06/03/15  6:28 AM  Result Value Ref Range   Glucose-Capillary 110 (H) 65 - 99 mg/dL   Comment 1 Notify RN   Creatinine, serum     Status: Abnormal   Collection Time: 06/03/15  6:39 AM  Result Value Ref Range   Creatinine, Ser 1.30 (H) 0.61 - 1.24 mg/dL   GFR calc non Af Amer 53 (L) >60 mL/min   GFR calc Af Amer >60 >60 mL/min    Comment: (NOTE) The eGFR has been calculated using the CKD EPI equation. This calculation has not been validated in all clinical situations. eGFR's persistently <60 mL/min signify possible Chronic Kidney Disease.   Glucose, capillary     Status: None   Collection Time: 06/03/15 11:50 AM  Result Value Ref Range   Glucose-Capillary 76 65 - 99  mg/dL  Glucose, capillary     Status: Abnormal   Collection Time: 06/03/15  4:56 PM  Result Value Ref Range   Glucose-Capillary 144 (H) 65 - 99 mg/dL  Glucose, capillary     Status: Abnormal   Collection Time: 06/03/15  8:58 PM  Result Value Ref Range   Glucose-Capillary 211 (H) 65 - 99 mg/dL  Glucose, capillary     Status: Abnormal   Collection Time: 06/04/15  6:51 AM  Result Value Ref Range   Glucose-Capillary 102 (H) 65 - 99 mg/dL  Glucose, capillary     Status: Abnormal   Collection Time: 06/04/15 11:57 AM  Result Value Ref Range   Glucose-Capillary 135 (H) 65 - 99 mg/dL  Glucose, capillary     Status: None   Collection Time: 06/04/15  4:38 PM  Result Value Ref Range   Glucose-Capillary 98 65 - 99 mg/dL  Glucose, capillary     Status: Abnormal   Collection Time: 06/04/15  8:41 PM  Result Value Ref Range   Glucose-Capillary 134 (H) 65 - 99 mg/dL  Glucose, capillary     Status: None   Collection Time: 06/05/15  6:36 AM  Result Value Ref Range   Glucose-Capillary 78 65 - 99 mg/dL  HEENT: Left pupil opacified with reduced (?absent) vision Cardio: RRR and no murmur Resp: CTA B/L and unlabored GI: BS positive and no bowel sounds Extremity:  No Edema Skin:   Other loop recorder site with eccyhmosis and minmal tenderness Neuro: Lethargic, Cranial Nerve Abnormalities Right central 7, Abnormal Sensory withdraws to pinch on right but unable to assess due to aphasia, Abnormal Motor 3- Right delt, bi, tri, 2- R WE, FE, 3- R HF, KE 2- ankle, Dysarthric and Aphasic Musc/Skel:  No pain with LE ROM, No joint pain in Right foot, no swelling, R foot temp cool but no skin discoloration Gen NAD   Assessment/Plan: 1. Functional deficits secondary to Bilateral ACA/PCA infarct which require 3+ hours per day of interdisciplinary therapy in a comprehensive inpatient rehab setting. Physiatrist is providing close team supervision and 24 hour management of active medical problems listed  below. Physiatrist and rehab team continue to assess barriers to discharge/monitor patient progress toward functional and medical goals.  FIM: Function - Bathing Position: Shower Body parts bathed by patient: Right arm, Left arm, Chest, Abdomen, Front perineal area, Buttocks, Right upper leg, Left upper leg, Right lower leg, Left lower leg Body parts bathed by helper: Back Assist Level: Touching or steadying assistance(Pt > 75%)  Function- Upper Body Dressing/Undressing What is the patient wearing?: Pull over shirt/dress Pull over shirt/dress - Perfomed by patient: Thread/unthread right sleeve, Thread/unthread left sleeve, Put head through opening, Pull shirt over trunk Pull over shirt/dress - Perfomed by helper: Thread/unthread right sleeve Button up shirt - Perfomed by patient: Thread/unthread left sleeve, Pull shirt around back Button up shirt - Perfomed by helper: Thread/unthread right sleeve, Button/unbutton shirt Assist Level: Supervision or verbal cues Function - Lower Body Dressing/Undressing What is the patient wearing?: Underwear, Pants, Socks, Shoes Position: Sitting EOB Underwear - Performed by patient: Thread/unthread right underwear leg, Thread/unthread left underwear leg, Pull underwear up/down Underwear - Performed by helper: Thread/unthread right underwear leg Pants- Performed by patient: Thread/unthread right pants leg, Thread/unthread left pants leg, Pull pants up/down Pants- Performed by helper: Thread/unthread right pants leg Socks - Performed by patient: Don/doff right sock, Don/doff left sock Socks - Performed by helper: Don/doff right sock, Don/doff left sock Shoes - Performed by patient: Don/doff right shoe, Don/doff left shoe Shoes - Performed by helper: Fasten right, Fasten left Assist Level: Touching or steadying assistance (Pt > 75%)  Function - Toileting Toileting steps completed by patient: Adjust clothing prior to toileting, Performs perineal hygiene,  Adjust clothing after toileting Toileting steps completed by helper: Adjust clothing after toileting Toileting Assistive Devices: Grab bar or rail Assist level: Touching or steadying assistance (Pt.75%)  Function - Air cabin crew transfer assistive device: Walker, Grab bar Assist level to toilet: Touching or steadying assistance (Pt > 75%) Assist level from toilet: Touching or steadying assistance (Pt > 75%)  Function - Chair/bed transfer Chair/bed transfer method: Ambulatory Chair/bed transfer assist level: Supervision or verbal cues Chair/bed transfer assistive device: Armrests Chair/bed transfer details: Verbal cues for precautions/safety  Function - Locomotion: Wheelchair Will patient use wheelchair at discharge?: No Max wheelchair distance: 150 Assist Level: Supervision or verbal cues Assist Level: Supervision or verbal cues Assist Level: Supervision or verbal cues Function - Locomotion: Ambulation Assistive device: No device Max distance: 200' Assist level: Touching or steadying assistance (Pt > 75%) Assist level: Touching or steadying assistance (Pt > 75%) Assist level: Touching or steadying assistance (Pt > 75%) Assist level: Touching or steadying assistance (Pt > 75%) Assist level: Supervision  or verbal cues (on blue foam mat)  Function - Comprehension Comprehension: Auditory Comprehension assist level: Understands basic 75 - 89% of the time/ requires cueing 10 - 24% of the time  Function - Expression Expression: Verbal Expression assist level: Expresses basis less than 25% of the time/requires cueing >75% of the time.  Function - Social Interaction Social Interaction assist level: Interacts appropriately 50 - 74% of the time - May be physically or verbally inappropriate.  Function - Problem Solving Problem solving assist level: Solves basic 25 - 49% of the time - needs direction more than half the time to initiate, plan or complete simple  activities  Function - Memory Memory assist level: Recognizes or recalls 25 - 49% of the time/requires cueing 50 - 75% of the time Patient normally able to recall (first 3 days only): Staff names and faces, That he or she is in a hospital  Medical Problem List and Plan: 1. 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  DVT Prophylaxis/Anticoagulation: Pharmaceutical: Lovenox 3. Chronic pain/Pain Management: Continue hydrocodone prn for OA/DDD.    4. Adjustment reaction/ Mood: Team to provide ego support.  LCSW to follow for evaluation and support. Continue Xanax bid.  Low dose 5. Neuropsych: This patient is capable of making decisions on his own behalf. 6. Skin/Wound Care: routine pressure relief measures 7. Fluids/Electrolytes/Nutrition: Monitor I/O.75-100% meals  BUN ok no IVF. Push po fluids 8. Acute Renal insufficiency: resolved 9.  CAD/AAA:  Stable on ASA and zocor.   10. DM type 2: Will monitor BS ac/hs. Was on lantus 75 and glipizide at home.  Will cont lantus to 45 units cont meal coverage for now.  Increase glipizide  Am to help with daytime CBGs (used at home). Use SSI for elevated BS.  Now control, monitor for hypoglycemia11. HTN:  Blood pressures  (90/57). Continue to hold lisinopril and avoid hypotension/hypoperfusion. Offer fluids between meals.     12. Insomnia: should improve with increased activity 13.  "moderate" stool burden suggesting constipation, prophyllactic laxatives, cont of stool 14.  Orthostatic drop in BP no BP meds, on episode only , monitor 15  Cold feet ABI 6/16 normal  LOS (Days) 9 A FACE TO FACE EVALUATION WAS PERFORMED  KIRSTEINS,ANDREW E 06/05/2015, 7:40 AM

## 2015-06-05 NOTE — Progress Notes (Signed)
Daughter expressed multiple concerns --patient has been depressed since wife passed away earlier this year.  Relayed to her that Remeron was started last pm. She is upset that patient has not been getting his pain med. He has chronic pain and used hydrocodone qid at home and feels that this is affecting his mood and energy levels. Agreed to schedule this for now and monitor for SE.

## 2015-06-05 NOTE — Progress Notes (Signed)
Social Work Patient ID: Lucas Morgan, male   DOB: [birth year], 74 y.o.   MRN: [id number] Spoke with daughter-Debby who expressed concerns regarding pt's pain and needing to wake him up to give it to him so when he wakes up he is not in excruciating pain. This is how she does it at home and he manages his pain. She is also concerned about making sure he has something to drink in his room, due to when she is here he is  So thristy he can't get enough to drink. He is also grieving his wife's death in 12/11/2022.  Will follow up on both of these concerns.

## 2015-06-05 NOTE — Significant Event (Signed)
Hypoglycemic Event  CBG: 60 @ 2050  Treatment: 15 GM carbohydrate snack  Symptoms: None  Follow-up CBG: Time:2117 CBG Result: 93  Possible Reasons for Event: Unknown; Recorded that pt ate full meal at dinner.   Comments/MD notified: Jesusita Oka, [place] on-call notified at 2201. No new orders received. Pt resting comfortably, no complaints.     Scarlette Slice K  Remember to initiate Hypoglycemia Order Set & complete

## 2015-06-05 NOTE — Plan of Care (Signed)
Problem: RH SAFETY Goal: RH STG ADHERE TO SAFETY PRECAUTIONS W/ASSISTANCE/DEVICE STG Adhere to Safety Precautions With mod Assistance/Device.  Outcome: Progressing Bed alarm; quick release; frequent rounding; at nursing station if needed

## 2015-06-05 NOTE — Progress Notes (Signed)
Social Work Patient ID: Lucas Morgan, male   DOB: June 15, 1941, 74 y.o.   MRN: [id number] Spoke with daughter-Debby to discuss team conference goals and discharge date 9/28.  She will plan to come in next week to go through therapies with pt. She is aware pt will need 24 hr care and is planning on taking a FMLA. Will work toward discharge.

## 2015-06-06 ENCOUNTER — Inpatient Hospital Stay (HOSPITAL_COMMUNITY): Payer: Medicare Other

## 2015-06-06 ENCOUNTER — Inpatient Hospital Stay (HOSPITAL_COMMUNITY): Payer: Medicare Other | Admitting: Physical Therapy

## 2015-06-06 ENCOUNTER — Inpatient Hospital Stay (HOSPITAL_COMMUNITY): Payer: Medicare Other | Admitting: Speech Pathology

## 2015-06-06 LAB — GLUCOSE, CAPILLARY
GLUCOSE-CAPILLARY: 95 mg/dL (ref 65–99)
GLUCOSE-CAPILLARY: 162 mg/dL — AB (ref 65–99)
Glucose-Capillary: 179 mg/dL — ABNORMAL HIGH (ref 65–99)
Glucose-Capillary: 225 mg/dL — ABNORMAL HIGH (ref 65–99)

## 2015-06-06 MED ORDER — INSULIN GLARGINE 100 UNIT/ML ~~LOC~~ SOLN
40.0000 [IU] | Freq: Every day | SUBCUTANEOUS | Status: DC
  Administered 2015-06-06 – 2015-06-09 (×4): 40 [IU] via SUBCUTANEOUS
  Filled 2015-06-06 (×4): qty 0.4

## 2015-06-06 NOTE — Progress Notes (Signed)
Occupational Therapy Session Note  Patient Details  Name: Lucas Morgan MRN: [id number] Date of Birth: 1941/01/11  Today's Date: 06/06/2015 OT Individual Time: 8119-1478 OT Individual Time Calculation (min): 60 min    Short Term Goals: Week 2:  OT Short Term Goal 1 (Week 2): STG=LTG d/t short LOS  Skilled Therapeutic Interventions/Progress Updates: ADL-retraining (30 min) at shower level with focus on improved functional use of RUE, visual scanning, and sequencing.   Therapeutic activity with focus on right wrist strengthening, pinch/prehension tasks (assembling nuts, bolts, washers).   Pt completes bathing with overall supervision and intermittent cues to problem-solve, attend to task completion, and scan while searching for items (clothing, washcloth, soap).   Pt completes BADL and therapeutic task with good effort in attempting use of RUE however FMC at right hand is limited d/t extension weakness at digits 3,4,5.     Therapy Documentation Precautions:  Precautions Precautions: Fall Precaution Comments: expressive aphasia, impulsive Restrictions Weight Bearing Restrictions: No  Vital Signs: Therapy Vitals Pulse Rate: (!) 111 (after walking) BP: 98/74 mmHg Patient Position (if appropriate): Sitting Oxygen Therapy SpO2: 96 % O2 Device: Not Delivered   Pain: Pain Assessment Pain Assessment: 0-10 Pain Score: 6  Pain Type: Chronic pain Pain Location: Back Pain Orientation: Lower Pain Descriptors / Indicators: Aching Pain Onset: Gradual Pain Intervention(s): Medication (See eMAR) Multiple Pain Sites: No  Exercises: Hand Exercises Wrist Flexion: Right;Strengthening;10 reps;Other (comment);Seated (4 and 8 oz mallets) Wrist Extension: Right;Strengthening;10 reps;Seated (4 and 8 oz mallets) Wrist Ulnar Deviation: Right;Strengthening;10 reps;Seated (4 and 8 oz mallets) Wrist Radial Deviation: Right;Strengthening;10 reps;Seated (4 and 8 oz mallets)  See Function Navigator  for Current Functional Status.   Therapy/Group: Individual Therapy  BARTHOLD,FRANK 06/06/2015, 12:14 PM

## 2015-06-06 NOTE — Progress Notes (Signed)
Physical Therapy Session Note  Patient Details  Name: Lucas Morgan MRN: [id number] Date of Birth: 17-Nov-1940  Today's Date: 06/06/2015 PT Individual Time: 0930-1030  PT Individual Time Calculation (min): 60 min   Short Term Goals: Week 2:  PT Short Term Goal 1 (Week 2): STGs = LTGs due to ELOS  Skilled Therapeutic Interventions/Progress Updates:    Treatment Session 1: Pt received up in w/c with quick release belt in place and RN administering medication. Pt agreeable to PT session, but reports fatigue. Therapeutic Exercise: Pt is concerned that R 3rd-5th fingers will not extend passively and so PT instructs pt in "prayer stretch" that includes matching hands together, holding in front of chest, then sliding down towards belly in order to incorporate finger extension and wrist extension stretch: 3 x 1 minute each. Pt educated to do moderate intensity and how to increase or decrease intensity of stretch. PT instructs pt in prone press-ups 3 x 10 reps to decrease LBP and improve R UE motor function with mobility. Gait Training: PT instructs pt in use of Rollator during ambulation in controlled unit, over thresholds, and various surfaces - req SBA for safety, verbal cues to park rollator against wall and lock brakes prior to sitting in it, and cues to pick up R foot with increasing gait distance as R foot fatigues and causes pt to slightly stumble. Therapeutic Activity - PT instructs pt in mat mobility/pre-falls recovery: sit to supine to prone to prone on elbows/pushing up to arch req verbal & tactile cues for technique x 3 reps req CGA-SBA. Pt req frequent, prolonged rest breaks due to fatigue, but participates well during PT session. Neuromuscular Reeducation: PT instructs pt in forced use fine & gross motor control activity with R UE - picking out clothespins and placing on flagpole req up to min A. Pt ended resting in bed with alarm on and all needs in reach. Continue per PT pOC.     Therapy  Documentation Precautions:  Precautions Precautions: Fall Precaution Comments: expressive aphasia, impulsive Restrictions Weight Bearing Restrictions: No Vital Signs:  Therapy Vitals Pulse Rate: (!) 111 (after walking) BP: 98/74 mmHg Patient Position (if appropriate): Sitting Oxygen Therapy SpO2: 96 % O2 Device: Not Delivered Pain: Pain Assessment Pain Assessment: 0-10 Pain Score: 6  Pain Type: Chronic pain Pain Location: Back Pain Orientation: Lower Pain Descriptors / Indicators: Aching Pain Onset: Gradual Pain Intervention(s): Medication (See eMAR) Multiple Pain Sites: No   See Function Navigator for Current Functional Status.   Therapy/Group: Individual Therapy  HYSLOP,AMANDA M 06/06/2015, 9:57 AM

## 2015-06-06 NOTE — Progress Notes (Signed)
Speech Language Pathology Daily Session Note  Patient Details  Name: Lucas Morgan MRN: [id number] Date of Birth: 16-Sep-1940  Today's Date: 06/06/2015 SLP Individual Time: 1400-1500 SLP Individual Time Calculation (min): 60 min  Short Term Goals: Week 2: SLP Short Term Goal 1 (Week 2): Pt will attend to right upper extremity and environment during structured tasks with Min verbal and visual cues SLP Short Term Goal 2 (Week 2): Pt will request help as needed during self-care tasks with Mod question cues SLP Short Term Goal 3 (Week 2): Pt will demonstrate basic problem solving with Min verbal and visual cues SLP Short Term Goal 4 (Week 2): Pt will utilize pictures and gestures as non-verbal means of communicating basic needs and wants with Min assist   SLP Short Term Goal 5 (Week 2): Pt will follow 2-3 step directions with Sueprvision level verbal cues SLP Short Term Goal 6 (Week 2): Pt will verbally express phrase length utterances during structured tasks with Mod semantic cues.   Skilled Therapeutic Interventions:  Pt was seen for skilled ST targeting communication goals and ongoing family education.  Upon arrival, pt was seated upright in wheelchair with daughter, [place] present.  SLP initiated skilled education regarding communication strategies to facilitate pt's functional independence for conveying his needs/wants to caregivers.  SLP emphasized the need for pt to talk slowly and loudly and to only use 2-3 words at a time in order.  SLP also demonstrated cuing strategies to improve pt's awareness and correction of perseveration during a previously targeted picture description/barrier task.  Pt's daughter returned demonstration of cuing techniques during a basic board game targeting verbal description.  Pt was able to describe different characters from said board game with min-mod assist verbal cues both from SLP and pt's daughter.  Pt was also encouraged throughout session to utilize gestures to  augment his communication, which he was able to implement in ~50% of opportunities with mod assist verbal cues. Pt was left upright in wheelchair with daughter present.  Continue per current plan of care.     Function:  Eating Eating                 Cognition Comprehension Comprehension assist level: Understands basic 75 - 89% of the time/ requires cueing 10 - 24% of the time  Expression   Expression assist level: Expresses basic 25 - 49% of the time/requires cueing 50 - 75% of the time. Uses single words/gestures.  Social Interaction Social Interaction assist level: Interacts appropriately 50 - 74% of the time - May be physically or verbally inappropriate.  Problem Solving Problem solving assist level: Solves basic 25 - 49% of the time - needs direction more than half the time to initiate, plan or complete simple activities  Memory Memory assist level: Recognizes or recalls 25 - 49% of the time/requires cueing 50 - 75% of the time    Pain Pain Assessment Pain Assessment: No/denies pain   Therapy/Group: Individual Therapy  Page, Melanee Spry 06/06/2015, 4:21 PM

## 2015-06-06 NOTE — Progress Notes (Signed)
Subjective/Complaints: Severe aphasia and dysarthria Asking NA to be toileted  ROS difficult to obtain, does appear accurate with Y/N nods, no breathing issues , no Nausea or vomiting  Objective: Vital Signs: Blood pressure 110/76, pulse 88, temperature 98.7 F (37.1 C), temperature source Oral, resp. rate 19, height  (1.676 m), weight 73.936 kg (163 lb), SpO2 96 %. No results found. Results for orders placed or performed during the hospital encounter of 05/27/15 (from the past 72 hour(s))  Glucose, capillary     Status: None   Collection Time: 06/03/15 11:50 AM  Result Value Ref Range   Glucose-Capillary 76 65 - 99 mg/dL  Glucose, capillary     Status: Abnormal   Collection Time: 06/03/15  4:56 PM  Result Value Ref Range   Glucose-Capillary 144 (H) 65 - 99 mg/dL  Glucose, capillary     Status: Abnormal   Collection Time: 06/03/15  8:58 PM  Result Value Ref Range   Glucose-Capillary 211 (H) 65 - 99 mg/dL  Glucose, capillary     Status: Abnormal   Collection Time: 06/04/15  6:51 AM  Result Value Ref Range   Glucose-Capillary 102 (H) 65 - 99 mg/dL  Glucose, capillary     Status: Abnormal   Collection Time: 06/04/15 11:57 AM  Result Value Ref Range   Glucose-Capillary 135 (H) 65 - 99 mg/dL  Glucose, capillary     Status: None   Collection Time: 06/04/15  4:38 PM  Result Value Ref Range   Glucose-Capillary 98 65 - 99 mg/dL  Glucose, capillary     Status: Abnormal   Collection Time: 06/04/15  8:41 PM  Result Value Ref Range   Glucose-Capillary 134 (H) 65 - 99 mg/dL  Glucose, capillary     Status: None   Collection Time: 06/05/15  6:36 AM  Result Value Ref Range   Glucose-Capillary 78 65 - 99 mg/dL  Glucose, capillary     Status: None   Collection Time: 06/05/15 12:04 PM  Result Value Ref Range   Glucose-Capillary 73 65 - 99 mg/dL  Glucose, capillary     Status: Abnormal   Collection Time: 06/05/15  4:42 PM  Result Value Ref Range   Glucose-Capillary 176 (H) 65 -  99 mg/dL  Glucose, capillary     Status: Abnormal   Collection Time: 06/05/15  8:50 PM  Result Value Ref Range   Glucose-Capillary 60 (L) 65 - 99 mg/dL  Glucose, capillary     Status: None   Collection Time: 06/05/15  9:17 PM  Result Value Ref Range   Glucose-Capillary 93 65 - 99 mg/dL  Glucose, capillary     Status: None   Collection Time: 06/06/15  6:34 AM  Result Value Ref Range   Glucose-Capillary 95 65 - 99 mg/dL     HEENT: Left pupil opacified with reduced (?absent) vision Cardio: RRR and no murmur Resp: CTA B/L and unlabored GI: BS positive and no bowel sounds Extremity:  No Edema Skin:   Other loop recorder site with eccyhmosis and minmal tenderness, small midline healed back incision Neuro: Lethargic, Cranial Nerve Abnormalities Right central 7, Abnormal Sensory withdraws to pinch on right but unable to assess due to aphasia, Abnormal Motor 3- Right delt, bi, tri, 2- R WE, FE, 3- R HF, KE 2- ankle, Dysarthric and Aphasic Musc/Skel:  No pain with SLR, back without tenderness Gen NAD   Assessment/Plan: 1. Functional deficits secondary to Bilateral ACA/PCA infarct which require 3+ hours per day of interdisciplinary  therapy in a comprehensive inpatient rehab setting. Physiatrist is providing close team supervision and 24 hour management of active medical problems listed below. Physiatrist and rehab team continue to assess barriers to discharge/monitor patient progress toward functional and medical goals.  FIM: Function - Bathing Position: Shower Body parts bathed by patient: Right arm, Left arm, Chest, Abdomen, Front perineal area, Buttocks, Right upper leg, Left upper leg, Right lower leg, Left lower leg Body parts bathed by helper: Back Assist Level: Touching or steadying assistance(Pt > 75%)  Function- Upper Body Dressing/Undressing What is the patient wearing?: Pull over shirt/dress Pull over shirt/dress - Perfomed by patient: Thread/unthread right sleeve,  Thread/unthread left sleeve, Put head through opening, Pull shirt over trunk Pull over shirt/dress - Perfomed by helper: Thread/unthread right sleeve Button up shirt - Perfomed by patient: Thread/unthread left sleeve, Pull shirt around back Button up shirt - Perfomed by helper: Thread/unthread right sleeve, Button/unbutton shirt Assist Level: Supervision or verbal cues Function - Lower Body Dressing/Undressing What is the patient wearing?: Shoes Position: Sitting EOB Underwear - Performed by patient: Thread/unthread right underwear leg, Thread/unthread left underwear leg, Pull underwear up/down Underwear - Performed by helper: Thread/unthread right underwear leg Pants- Performed by patient: Thread/unthread right pants leg, Thread/unthread left pants leg, Pull pants up/down Pants- Performed by helper: Thread/unthread right pants leg Socks - Performed by patient: Don/doff right sock, Don/doff left sock Socks - Performed by helper: Don/doff right sock, Don/doff left sock Shoes - Performed by patient: Don/doff right shoe, Don/doff left shoe Shoes - Performed by helper: Fasten right, Fasten left Assist Level: Touching or steadying assistance (Pt > 75%)  Function - Toileting Toileting steps completed by patient: Adjust clothing prior to toileting, Performs perineal hygiene, Adjust clothing after toileting Toileting steps completed by helper: Adjust clothing prior to toileting Toileting Assistive Devices: Grab bar or rail Assist level: Touching or steadying assistance (Pt.75%)  Function - Archivist transfer assistive device: Grab bar, Walker Assist level to toilet: Touching or steadying assistance (Pt > 75%) Assist level from toilet: Touching or steadying assistance (Pt > 75%)  Function - Chair/bed transfer Chair/bed transfer method: Ambulatory Chair/bed transfer assist level: Supervision or verbal cues Chair/bed transfer assistive device: Armrests Chair/bed transfer details:  Verbal cues for precautions/safety  Function - Locomotion: Wheelchair Will patient use wheelchair at discharge?: No Max wheelchair distance: 150 Assist Level: Supervision or verbal cues Assist Level: Supervision or verbal cues Assist Level: Supervision or verbal cues Function - Locomotion: Ambulation Assistive device: No device Max distance: 100 Assist level: Supervision or verbal cues Assist level: Supervision or verbal cues Assist level: Supervision or verbal cues Assist level: Supervision or verbal cues Assist level: Supervision or verbal cues (on blue foam mat)  Function - Comprehension Comprehension: Auditory Comprehension assist level: Understands basic 75 - 89% of the time/ requires cueing 10 - 24% of the time  Function - Expression Expression: Verbal Expression assist level: Expresses basis less than 25% of the time/requires cueing >75% of the time.  Function - Social Interaction Social Interaction assist level: Interacts appropriately 50 - 74% of the time - May be physically or verbally inappropriate.  Function - Problem Solving Problem solving assist level: Solves basic 25 - 49% of the time - needs direction more than half the time to initiate, plan or complete simple activities  Function - Memory Memory assist level: Recognizes or recalls 25 - 49% of the time/requires cueing 50 - 75% of the time Patient normally able to recall (first 3 days  only): Staff names and faces, That he or she is in a hospital  Medical Problem List and Plan: 1. 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  DVT Prophylaxis/Anticoagulation: Pharmaceutical: Lovenox 3. Chronic pain/Pain Management: Continue hydrocodone prn for OA/DDD.    4. Adjustment reaction/ Mood: Team to provide ego support.  LCSW to  follow for evaluation and support. Continue Xanax bid.  Low dose 5. Neuropsych: This patient is capable of making decisions on his own behalf. 6. Skin/Wound Care: routine pressure relief measures 7. Fluids/Electrolytes/Nutrition: Monitor I/O.100% meals  BUN ok no IVF. Push po fluids 8. Acute Renal insufficiency: resolved 9.  CAD/AAA:  Stable on ASA and zocor.   10. DM type 2: Will monitor BS ac/hs. Was on lantus 75 and glipizide at home.   lantus to 40 units cont meal coverage for now.  Increase glipizide  Am to help with daytime CBGs (used at home). Use SSI for elevated BS.  stop routine mealtime coverage due to  Hypoglycemia                     11. HTN:  Blood pressures  (110/76). Continue to hold lisinopril and avoid hypotension/hypoperfusion. Offer fluids between meals.     12. Insomnia: should improve with increased activity, on remeron, may have element of depresssion but cannot do formal testing due to aphasia 13.  "moderate" stool burden suggesting constipation, prophyllactic laxatives, cont of stool, may worsen now that pt back on home med of hydrocodone  14  Chronic complaint Cold feet ABI 6/16 normal  LOS (Days) 10 A FACE TO FACE EVALUATION WAS PERFORMED  KIRSTEINS,ANDREW E 06/06/2015, 7:19 AM

## 2015-06-07 ENCOUNTER — Inpatient Hospital Stay (HOSPITAL_COMMUNITY): Payer: Medicare Other | Admitting: Occupational Therapy

## 2015-06-07 LAB — GLUCOSE, CAPILLARY
GLUCOSE-CAPILLARY: 116 mg/dL — AB (ref 65–99)
GLUCOSE-CAPILLARY: 338 mg/dL — AB (ref 65–99)
GLUCOSE-CAPILLARY: 111 mg/dL — AB (ref 65–99)
Glucose-Capillary: 113 mg/dL — ABNORMAL HIGH (ref 65–99)

## 2015-06-07 NOTE — Progress Notes (Signed)
Subjective/Complaints: Sitting at eob eating breakfast. Appears comfortable. Cleaning plate. Nurse states he slept much better last night. Pt acknowledges  ROS difficult to obtain, does appear accurate with Y/N nods, no breathing issues , no Nausea or vomiting  Objective: Vital Signs: Blood pressure 108/63, pulse 91, temperature 98.6 F (37 C), temperature source Oral, resp. rate 17, height  (1.676 m), weight 73.936 kg (163 lb), SpO2 96 %. No results found. Results for orders placed or performed during the hospital encounter of 05/27/15 (from the past 72 hour(s))  Glucose, capillary     Status: Abnormal   Collection Time: 06/04/15 11:57 AM  Result Value Ref Range   Glucose-Capillary 135 (H) 65 - 99 mg/dL  Glucose, capillary     Status: None   Collection Time: 06/04/15  4:38 PM  Result Value Ref Range   Glucose-Capillary 98 65 - 99 mg/dL  Glucose, capillary     Status: Abnormal   Collection Time: 06/04/15  8:41 PM  Result Value Ref Range   Glucose-Capillary 134 (H) 65 - 99 mg/dL  Glucose, capillary     Status: None   Collection Time: 06/05/15  6:36 AM  Result Value Ref Range   Glucose-Capillary 78 65 - 99 mg/dL  Glucose, capillary     Status: None   Collection Time: 06/05/15 12:04 PM  Result Value Ref Range   Glucose-Capillary 73 65 - 99 mg/dL  Glucose, capillary     Status: Abnormal   Collection Time: 06/05/15  4:42 PM  Result Value Ref Range   Glucose-Capillary 176 (H) 65 - 99 mg/dL  Glucose, capillary     Status: Abnormal   Collection Time: 06/05/15  8:50 PM  Result Value Ref Range   Glucose-Capillary 60 (L) 65 - 99 mg/dL  Glucose, capillary     Status: None   Collection Time: 06/05/15  9:17 PM  Result Value Ref Range   Glucose-Capillary 93 65 - 99 mg/dL  Glucose, capillary     Status: None   Collection Time: 06/06/15  6:34 AM  Result Value Ref Range   Glucose-Capillary 95 65 - 99 mg/dL  Glucose, capillary     Status: Abnormal   Collection Time: 06/06/15  11:31 AM  Result Value Ref Range   Glucose-Capillary 162 (H) 65 - 99 mg/dL  Glucose, capillary     Status: Abnormal   Collection Time: 06/06/15  4:36 PM  Result Value Ref Range   Glucose-Capillary 179 (H) 65 - 99 mg/dL  Glucose, capillary     Status: Abnormal   Collection Time: 06/06/15  8:48 PM  Result Value Ref Range   Glucose-Capillary 225 (H) 65 - 99 mg/dL  Glucose, capillary     Status: Abnormal   Collection Time: 06/07/15  6:35 AM  Result Value Ref Range   Glucose-Capillary 116 (H) 65 - 99 mg/dL   Comment 1 Notify RN      HEENT: Left pupil opacified with reduced (?absent) vision Cardio: RRR and no murmur Resp: CTA B/L and unlabored GI: BS positive and no bowel sounds Extremity:  No Edema Skin:   Other loop recorder site with eccyhmosis and minmal tenderness, small midline healed back incision Neuro: alert, Cranial Nerve Abnormalities Right central 7, Abnormal Sensory withdraws to pinch on right but unable to assess due to aphasia, Abnormal Motor 3- Right delt, bi, tri, 2- R WE, FE, 3- R HF, KE 2- ankle, Dysarthric and Aphasic but follows simple commands fairly well.  Musc/Skel:  No pain with  SLR, back without tenderness Gen NAD   Assessment/Plan: 1. Functional deficits secondary to Bilateral ACA/PCA infarct which require 3+ hours per day of interdisciplinary therapy in a comprehensive inpatient rehab setting. Physiatrist is providing close team supervision and 24 hour management of active medical problems listed below. Physiatrist and rehab team continue to assess barriers to discharge/monitor patient progress toward functional and medical goals.  FIM: Function - Bathing Position: Shower Body parts bathed by patient: Right arm, Left arm, Chest, Abdomen, Front perineal area, Buttocks, Right upper leg, Left upper leg, Right lower leg, Left lower leg Body parts bathed by helper: Back Assist Level: Supervision or verbal cues  Function- Upper Body Dressing/Undressing What  is the patient wearing?: Pull over shirt/dress Pull over shirt/dress - Perfomed by patient: Thread/unthread right sleeve, Thread/unthread left sleeve, Put head through opening, Pull shirt over trunk Pull over shirt/dress - Perfomed by helper: Thread/unthread right sleeve Button up shirt - Perfomed by patient: Thread/unthread left sleeve, Pull shirt around back Button up shirt - Perfomed by helper: Thread/unthread right sleeve, Button/unbutton shirt Assist Level: Supervision or verbal cues Function - Lower Body Dressing/Undressing What is the patient wearing?: Underwear, Pants, Socks, Shoes Position: Wheelchair/chair at [employer] - Performed by patient: Thread/unthread right underwear leg, Thread/unthread left underwear leg, Pull underwear up/down Underwear - Performed by helper: Thread/unthread right underwear leg Pants- Performed by patient: Thread/unthread right pants leg, Thread/unthread left pants leg, Pull pants up/down, Fasten/unfasten pants Pants- Performed by helper: Thread/unthread right pants leg Socks - Performed by patient: Don/doff right sock, Don/doff left sock Socks - Performed by helper: Don/doff right sock, Don/doff left sock Shoes - Performed by patient: Don/doff right shoe, Don/doff left shoe, Fasten right, Fasten left Shoes - Performed by helper: Fasten right, Fasten left Assist Level: Supervision or verbal cues  Function - Toileting Toileting steps completed by patient: Adjust clothing prior to toileting, Performs perineal hygiene, Adjust clothing after toileting Toileting steps completed by helper: Adjust clothing prior to toileting Toileting Assistive Devices: Grab bar or rail Assist level: Touching or steadying assistance (Pt.75%)  Function - Toilet Transfers Toilet transfer assistive device: Grab bar Assist level to toilet: Supervision or verbal cues Assist level from toilet: Supervision or verbal cues  Function - Chair/bed transfer Chair/bed transfer  method: Ambulatory Chair/bed transfer assist level: Supervision or verbal cues Chair/bed transfer assistive device: Walker, Armrests Chair/bed transfer details: Verbal cues for safe use of DME/AE  Function - Locomotion: Wheelchair Will patient use wheelchair at discharge?: No Max wheelchair distance: 150 Assist Level: Supervision or verbal cues Assist Level: Supervision or verbal cues Assist Level: Supervision or verbal cues Function - Locomotion: Ambulation Assistive device: Walker-rolling Aeronautical engineer) Max distance: 250' x 2 reps Assist level: Supervision or verbal cues Assist level: Supervision or verbal cues Assist level: Supervision or verbal cues Assist level: Supervision or verbal cues Assist level: Supervision or verbal cues (on blue foam mat)  Function - Comprehension Comprehension: Auditory Comprehension assist level: Understands basic 75 - 89% of the time/ requires cueing 10 - 24% of the time  Function - Expression Expression: Verbal Expression assist level: Expresses basic 25 - 49% of the time/requires cueing 50 - 75% of the time. Uses single words/gestures.  Function - Social Interaction Social Interaction assist level: Interacts appropriately 50 - 74% of the time - May be physically or verbally inappropriate.  Function - Problem Solving Problem solving assist level: Solves basic 25 - 49% of the time - needs direction more than half the time to initiate, plan  or complete simple activities  Function - Memory Memory assist level: Recognizes or recalls 25 - 49% of the time/requires cueing 50 - 75% of the time Patient normally able to recall (first 3 days only): Staff names and faces, That he or she is in a hospital  Medical Problem List and Plan: 1. 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  DVT Prophylaxis/Anticoagulation: Pharmaceutical: Lovenox 3. Chronic pain/Pain Management: Continue hydrocodone prn for OA/DDD.    4. Adjustment reaction/ Mood: Team to provide ego support.  LCSW to follow for evaluation and support. Continue Xanax bid.  Low dose 5. Neuropsych: This patient is capable of making decisions on his own behalf. 6. Skin/Wound Care: routine pressure relief measures 7. Fluids/Electrolytes/Nutrition: Monitor I/O.100% meals  BUN ok no IVF. Push po fluids 8. Acute Renal insufficiency: resolved 9.  CAD/AAA:  Stable on ASA and zocor.   10. DM type 2:   BS ac/hs. Was on lantus 75 and glipizide at home.   lantus to 40 units cont meal coverage for now.  Increased glipizide  Am to help with daytime CBGs (used at home). Use SSI for elevated BS.  off routine mealtime coverage due to  Hypoglycemia                      11. HTN:  Blood pressures  (110/76). Continue to hold lisinopril and avoid hypotension/hypoperfusion. Offer fluids between meals.     12. Insomnia: remeron. Did much better last night 13. Constipation: senna s bid.  14  Chronic complaint Cold feet ABI 6/16 normal  LOS (Days) 11 A FACE TO FACE EVALUATION WAS PERFORMED  SWARTZ,ZACHARY T 06/07/2015, 8:28 AM

## 2015-06-07 NOTE — Progress Notes (Signed)
Occupational Therapy Session Note  Patient Details  Name: Lucas Morgan MRN: [id number] Date of Birth: 1941-04-28  Today's Date: 06/07/2015 OT Individual Time:  -   1030-1130  (60 min)      Short Term Goals: Week 1:  OT Short Term Goal 1 (Week 1): Pt will locate items to the right during self care task with min verbal cues. OT Short Term Goal 1 - Progress (Week 1): Met OT Short Term Goal 2 (Week 1): Pt will perform LB dressing with mod A in order to decrease the level of assist needed for self care.  OT Short Term Goal 2 - Progress (Week 1): Met OT Short Term Goal 3 (Week 1): Pt will perform UB dressing with min A in order to decrease the level of assist needed for self care.  OT Short Term Goal 3 - Progress (Week 1): Met OT Short Term Goal 4 (Week 1): Pt will perform shower transfer with min A onto TTB in order to increase I with functional transfers. OT Short Term Goal 4 - Progress (Week 1): Met Week 2:  OT Short Term Goal 1 (Week 2): STG=LTG d/t short LOS  Skilled Therapeutic Interventions/Progress Updates:    Pt ambulated with HHA to shower, moving quickly with bed alarm going off.  Pt undressed in standing with close supervision for balance.  He needed CGA in shower for standing balance on one limb during bathing.  Continued to stand and balance on one foot frequently while dressing pants.  Sat to don shirt with difficulty with shirt orientation and increased time for ideational apraxia.  Pt shaved in sitting with set  Up .  Ambulated to bed and left with bed alarm on.    Therapy Documentation Precautions:  Precautions Precautions: Fall Precaution Comments: expressive aphasia, impulsive Restrictions Weight Bearing Restrictions: No General:   Vital Signs: Therapy Vitals Temp: 98.6 F (37 C) Temp Source: Oral Resp: 17 Oxygen Therapy SpO2: 96 % O2 Device: Not Delivered Pain:  none             See Function Navigator for Current Functional Status.   Therapy/Group:  Individual Therapy  Lisa Roca 06/07/2015, 8:02 AM `

## 2015-06-08 ENCOUNTER — Inpatient Hospital Stay (HOSPITAL_COMMUNITY): Payer: Medicare Other | Admitting: Occupational Therapy

## 2015-06-08 LAB — GLUCOSE, CAPILLARY
GLUCOSE-CAPILLARY: 91 mg/dL (ref 65–99)
Glucose-Capillary: 81 mg/dL (ref 65–99)
Glucose-Capillary: 239 mg/dL — ABNORMAL HIGH (ref 65–99)
Glucose-Capillary: 189 mg/dL — ABNORMAL HIGH (ref 65–99)

## 2015-06-08 NOTE — Progress Notes (Signed)
Subjective/Complaints: No new complaints. Good appetite. Nurse reported no problems.  ROS difficult to obtain, does appear accurate with Y/N nods, no breathing issues , no Nausea or vomiting  Objective: Vital Signs: Blood pressure 111/59, pulse 92, temperature 98.5 F (36.9 C), temperature source Oral, resp. rate 16, height  (1.676 m), weight 73.936 kg (163 lb), SpO2 96 %. No results found. Results for orders placed or performed during the hospital encounter of 05/27/15 (from the past 72 hour(s))  Glucose, capillary     Status: None   Collection Time: 06/05/15 12:04 PM  Result Value Ref Range   Glucose-Capillary 73 65 - 99 mg/dL  Glucose, capillary     Status: Abnormal   Collection Time: 06/05/15  4:42 PM  Result Value Ref Range   Glucose-Capillary 176 (H) 65 - 99 mg/dL  Glucose, capillary     Status: Abnormal   Collection Time: 06/05/15  8:50 PM  Result Value Ref Range   Glucose-Capillary 60 (L) 65 - 99 mg/dL  Glucose, capillary     Status: None   Collection Time: 06/05/15  9:17 PM  Result Value Ref Range   Glucose-Capillary 93 65 - 99 mg/dL  Glucose, capillary     Status: None   Collection Time: 06/06/15  6:34 AM  Result Value Ref Range   Glucose-Capillary 95 65 - 99 mg/dL  Glucose, capillary     Status: Abnormal   Collection Time: 06/06/15 11:31 AM  Result Value Ref Range   Glucose-Capillary 162 (H) 65 - 99 mg/dL  Glucose, capillary     Status: Abnormal   Collection Time: 06/06/15  4:36 PM  Result Value Ref Range   Glucose-Capillary 179 (H) 65 - 99 mg/dL  Glucose, capillary     Status: Abnormal   Collection Time: 06/06/15  8:48 PM  Result Value Ref Range   Glucose-Capillary 225 (H) 65 - 99 mg/dL  Glucose, capillary     Status: Abnormal   Collection Time: 06/07/15  6:35 AM  Result Value Ref Range   Glucose-Capillary 116 (H) 65 - 99 mg/dL   Comment 1 Notify RN   Glucose, capillary     Status: Abnormal   Collection Time: 06/07/15 11:23 AM  Result Value Ref  Range   Glucose-Capillary 113 (H) 65 - 99 mg/dL  Glucose, capillary     Status: Abnormal   Collection Time: 06/07/15  4:23 PM  Result Value Ref Range   Glucose-Capillary 338 (H) 65 - 99 mg/dL  Glucose, capillary     Status: Abnormal   Collection Time: 06/07/15  8:45 PM  Result Value Ref Range   Glucose-Capillary 111 (H) 65 - 99 mg/dL  Glucose, capillary     Status: None   Collection Time: 06/08/15  6:40 AM  Result Value Ref Range   Glucose-Capillary 91 65 - 99 mg/dL   Comment 1 Notify RN      HEENT: Left pupil opacified with reduced (?absent) vision Cardio: RRR and no murmur Resp: CTA B/L and unlabored GI: BS positive and no bowel sounds Extremity:  No Edema Skin:   Other loop recorder site with eccyhmosis and minmal tenderness, small midline healed back incision Neuro: alert, Cranial Nerve Abnormalities Right central 7, Abnormal Sensory withdraws to pinch on right but unable to assess due to aphasia, Abnormal Motor 3- Right delt, bi, tri, 2- R WE, FE, 3- R HF, KE 2- ankle, Dysarthric and Aphasic but follows simple commands fairly well.  Musc/Skel:  No pain with SLR, back  without tenderness Gen NAD   Assessment/Plan: 1. Functional deficits secondary to Bilateral ACA/PCA infarct which require 3+ hours per day of interdisciplinary therapy in a comprehensive inpatient rehab setting. Physiatrist is providing close team supervision and 24 hour management of active medical problems listed below. Physiatrist and rehab team continue to assess barriers to discharge/monitor patient progress toward functional and medical goals.  FIM: Function - Bathing Position: Shower Body parts bathed by patient: Right arm, Left arm, Chest, Abdomen, Front perineal area, Buttocks, Right upper leg, Left upper leg, Right lower leg, Left lower leg Body parts bathed by helper: Back Assist Level: Supervision or verbal cues (assist for balance in standing on one leg stance) Set up : To adjust water  temperature, To obtain items  Function- Upper Body Dressing/Undressing What is the patient wearing?: Pull over shirt/dress Pull over shirt/dress - Perfomed by patient: Thread/unthread right sleeve, Thread/unthread left sleeve, Put head through opening, Pull shirt over trunk (increased time for motor planning) Pull over shirt/dress - Perfomed by helper: Thread/unthread right sleeve Button up shirt - Perfomed by patient: Thread/unthread left sleeve, Pull shirt around back Button up shirt - Perfomed by helper: Thread/unthread right sleeve, Button/unbutton shirt Assist Level: Supervision or verbal cues Function - Lower Body Dressing/Undressing What is the patient wearing?: Underwear, Pants, Socks, Shoes Position: Standing at sink Underwear - Performed by patient: Pull underwear up/down, Thread/unthread left underwear leg, Thread/unthread right underwear leg Underwear - Performed by helper: Thread/unthread right underwear leg Pants- Performed by patient: Thread/unthread right pants leg, Thread/unthread left pants leg, Pull pants up/down, Fasten/unfasten pants Pants- Performed by helper: Thread/unthread right pants leg Socks - Performed by patient: Don/doff right sock, Don/doff left sock Socks - Performed by helper: Don/doff right sock, Don/doff left sock Shoes - Performed by patient: Don/doff right shoe, Don/doff left shoe, Fasten right, Fasten left Shoes - Performed by helper: Don/doff right shoe, Don/doff left shoe Assist Level: Supervision or verbal cues Set up : To obtain clothing/put away  Function - Toileting Toileting steps completed by patient: Adjust clothing prior to toileting, Performs perineal hygiene, Adjust clothing after toileting Toileting steps completed by helper: Adjust clothing prior to toileting Toileting Assistive Devices: Grab bar or rail Assist level: Touching or steadying assistance (Pt.75%)  Function - Toilet Transfers Toilet transfer assistive device: Grab  bar Assist level to toilet: Supervision or verbal cues Assist level from toilet: Supervision or verbal cues  Function - Chair/bed transfer Chair/bed transfer method: Ambulatory Chair/bed transfer assist level: Supervision or verbal cues Chair/bed transfer assistive device: Walker, Armrests Chair/bed transfer details: Verbal cues for safe use of DME/AE, Visual cues/gestures for precautions/safety  Function - Locomotion: Wheelchair Will patient use wheelchair at discharge?: No Max wheelchair distance: 150 Assist Level: Supervision or verbal cues Assist Level: Supervision or verbal cues Assist Level: Supervision or verbal cues Function - Locomotion: Ambulation Assistive device: Walker-rolling Max distance:  (in room ambulation) Assist level: Supervision or verbal cues Assist level: Supervision or verbal cues Assist level: Supervision or verbal cues Assist level: Supervision or verbal cues Assist level: Supervision or verbal cues (on blue foam mat)  Function - Comprehension Comprehension: Auditory Comprehension assist level: Understands basic 75 - 89% of the time/ requires cueing 10 - 24% of the time  Function - Expression Expression: Verbal Expression assist level: Expresses basic 25 - 49% of the time/requires cueing 50 - 75% of the time. Uses single words/gestures.  Function - Social Interaction Social Interaction assist level: Interacts appropriately 50 - 74% of the time -  May be physically or verbally inappropriate.  Function - Problem Solving Problem solving assist level: Solves basic 25 - 49% of the time - needs direction more than half the time to initiate, plan or complete simple activities  Function - Memory Memory assist level: Recognizes or recalls 25 - 49% of the time/requires cueing 50 - 75% of the time Patient normally able to recall (first 3 days only): Staff names and faces, That he or she is in a hospital  Medical Problem List and Plan: 1. Functional deficits  secondary to Bilateral embolic ACA/PCA infarcts.  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  DVT Prophylaxis/Anticoagulation: Pharmaceutical: Lovenox 3. Chronic pain/Pain Management: Continue hydrocodone prn for OA/DDD.    4. Adjustment reaction/ Mood: Team to provide ego support.  LCSW to follow for evaluation and support. Continue Xanax bid.  Low dose 5. Neuropsych: This patient is capable of making decisions on his own behalf. 6. Skin/Wound Care: routine pressure relief measures 7. Fluids/Electrolytes/Nutrition: Monitor I/O.100% meals   Push po fluids 8. Acute Renal insufficiency: resolved 9.  CAD/AAA:  Stable on ASA and zocor.   10. DM type 2:   BS ac/hs. Was on lantus 75 and glipizide at home.   lantus to 40 units cont meal coverage for now.  Increased glipizide in am to help with daytime CBGs (used at home). Use SSI for elevated BS.  off routine mealtime coverage due to  Hypoglycemia   -pattern inconsistent----observe today before making changes                     11. HTN:  Blood pressures  (110/76). Continue to hold lisinopril and avoid hypotension/hypoperfusion. Offer fluids between meals.     12. Insomnia: remeron. Appears to have slept better the last few nights. 13. Constipation: senna s bid.  14  Chronic complaint Cold feet ABI 6/16 normal  LOS (Days) 12 A FACE TO FACE EVALUATION WAS PERFORMED  SWARTZ,Lucas Morgan 06/08/2015, 8:07 AM

## 2015-06-08 NOTE — Progress Notes (Signed)
Occupational Therapy Session Note  Patient Details  Name: Lucas Morgan MRN: [id number] Date of Birth: 06/01/1941  Today's Date: 06/08/2015 OT Individual Time: 8366-2947   OT Individual Time Calculation (min): 45 min    Short Term Goals: Week 1:  OT Short Term Goal 1 (Week 1): Pt will locate items to the right during self care task with min verbal cues. OT Short Term Goal 1 - Progress (Week 1): Met OT Short Term Goal 2 (Week 1): Pt will perform LB dressing with mod A in order to decrease the level of assist needed for self care.  OT Short Term Goal 2 - Progress (Week 1): Met OT Short Term Goal 3 (Week 1): Pt will perform UB dressing with min A in order to decrease the level of assist needed for self care.  OT Short Term Goal 3 - Progress (Week 1): Met OT Short Term Goal 4 (Week 1): Pt will perform shower transfer with min A onto TTB in order to increase I with functional transfers. OT Short Term Goal 4 - Progress (Week 1): Met Week 2:  OT Short Term Goal 1 (Week 2): STG=LTG d/t short LOS  Skilled Therapeutic Interventions/Progress Updates:    Pt ambulated with HHA to shower with more control and not implusively.  Pt undressed in standing with close supervision for balance. He needed CGA in shower for standing balance on one limb during bathing. Continued to stand and balance on one foot frequently while dressing pants. Sat to don button up shirt.  Needed assist with buttons.  Ambulated to bed and left with bed alarm on.    Therapy Documentation Precautions:  Precautions Precautions: Fall Precaution Comments: expressive aphasia, impulsive Restrictions Weight Bearing Restrictions: No      Pain:  Back pain 8/10     See Function Navigator for Current Functional Status.   Therapy/Group: Individual Therapy  Lisa Roca 06/08/2015, 6:43 PM

## 2015-06-09 ENCOUNTER — Inpatient Hospital Stay (HOSPITAL_COMMUNITY): Payer: Medicare Other

## 2015-06-09 ENCOUNTER — Inpatient Hospital Stay (HOSPITAL_COMMUNITY): Payer: Medicare Other | Admitting: Speech Pathology

## 2015-06-09 ENCOUNTER — Encounter (HOSPITAL_COMMUNITY): Payer: Medicare Other

## 2015-06-09 ENCOUNTER — Inpatient Hospital Stay (HOSPITAL_COMMUNITY): Payer: Medicare Other | Admitting: Physical Therapy

## 2015-06-09 DIAGNOSIS — M545 Low back pain: Secondary | ICD-10-CM

## 2015-06-09 DIAGNOSIS — G8929 Other chronic pain: Secondary | ICD-10-CM

## 2015-06-09 LAB — GLUCOSE, CAPILLARY
GLUCOSE-CAPILLARY: 177 mg/dL — AB (ref 65–99)
GLUCOSE-CAPILLARY: 170 mg/dL — AB (ref 65–99)
Glucose-Capillary: 75 mg/dL (ref 65–99)
Glucose-Capillary: 92 mg/dL (ref 65–99)

## 2015-06-09 MED ORDER — MUSCLE RUB 10-15 % EX CREA
TOPICAL_CREAM | Freq: Two times a day (BID) | CUTANEOUS | Status: DC | PRN
  Filled 2015-06-09: qty 85

## 2015-06-09 NOTE — Progress Notes (Signed)
Subjective/Complaints: Severe aphasia and dysarthria   ROS difficult to obtain, does appear accurate with Y/N nods, no breathing issues , no Nausea or vomiting  Objective: Vital Signs: Blood pressure 107/62, pulse 78, temperature 98.7 F (37.1 C), temperature source Oral, resp. rate 18, height  (1.676 m), weight 73.936 kg (163 lb), SpO2 100 %. No results found. Results for orders placed or performed during the hospital encounter of 05/27/15 (from the past 72 hour(s))  Glucose, capillary     Status: Abnormal   Collection Time: 06/06/15 11:31 AM  Result Value Ref Range   Glucose-Capillary 162 (H) 65 - 99 mg/dL  Glucose, capillary     Status: Abnormal   Collection Time: 06/06/15  4:36 PM  Result Value Ref Range   Glucose-Capillary 179 (H) 65 - 99 mg/dL  Glucose, capillary     Status: Abnormal   Collection Time: 06/06/15  8:48 PM  Result Value Ref Range   Glucose-Capillary 225 (H) 65 - 99 mg/dL  Glucose, capillary     Status: Abnormal   Collection Time: 06/07/15  6:35 AM  Result Value Ref Range   Glucose-Capillary 116 (H) 65 - 99 mg/dL   Comment 1 Notify RN   Glucose, capillary     Status: Abnormal   Collection Time: 06/07/15 11:23 AM  Result Value Ref Range   Glucose-Capillary 113 (H) 65 - 99 mg/dL  Glucose, capillary     Status: Abnormal   Collection Time: 06/07/15  4:23 PM  Result Value Ref Range   Glucose-Capillary 338 (H) 65 - 99 mg/dL  Glucose, capillary     Status: Abnormal   Collection Time: 06/07/15  8:45 PM  Result Value Ref Range   Glucose-Capillary 111 (H) 65 - 99 mg/dL  Glucose, capillary     Status: None   Collection Time: 06/08/15  6:40 AM  Result Value Ref Range   Glucose-Capillary 91 65 - 99 mg/dL   Comment 1 Notify RN   Glucose, capillary     Status: None   Collection Time: 06/08/15 11:26 AM  Result Value Ref Range   Glucose-Capillary 81 65 - 99 mg/dL  Glucose, capillary     Status: Abnormal   Collection Time: 06/08/15  4:35 PM  Result Value  Ref Range   Glucose-Capillary 239 (H) 65 - 99 mg/dL   Comment 1 Notify RN   Glucose, capillary     Status: Abnormal   Collection Time: 06/08/15  8:42 PM  Result Value Ref Range   Glucose-Capillary 189 (H) 65 - 99 mg/dL  Glucose, capillary     Status: None   Collection Time: 06/09/15  6:33 AM  Result Value Ref Range   Glucose-Capillary 75 65 - 99 mg/dL     HEENT: Left pupil opacified with reduced (?absent) vision Cardio: RRR and no murmur Resp: CTA B/L and unlabored GI: BS positive and no bowel sounds Extremity:  No Edema Skin:   Other loop recorder site with eccyhmosis and minmal tenderness, small midline healed back incision Neuro: Lethargic, Cranial Nerve Abnormalities Right central 7, Abnormal Sensory withdraws to pinch on right but unable to assess due to aphasia, Abnormal Motor 3- Right delt, bi, tri, 2- R WE, FE, 3- R HF, KE 2- ankle, Dysarthric and Aphasic Musc/Skel:  No pain with SLR, back without tenderness , small heald incision Gen NAD   Assessment/Plan: 1. Functional deficits secondary to Bilateral ACA/PCA infarct which require 3+ hours per day of interdisciplinary therapy in a comprehensive inpatient rehab setting.  Physiatrist is providing close team supervision and 24 hour management of active medical problems listed below. Physiatrist and rehab team continue to assess barriers to discharge/monitor patient progress toward functional and medical goals.  FIM: Function - Bathing Position: Shower Body parts bathed by patient: Back, Left lower leg, Right lower leg, Right upper leg, Left upper leg, Buttocks, Front perineal area, Abdomen, Chest, Left arm, Right arm Body parts bathed by helper: Back Assist Level: Supervision or verbal cues, Set up Set up : To obtain items, To adjust water temperature  Function- Upper Body Dressing/Undressing What is the patient wearing?: Pull over shirt/dress Pull over shirt/dress - Perfomed by patient: Thread/unthread right sleeve,  Thread/unthread left sleeve, Put head through opening, Pull shirt over trunk (increased time for motor planning) Pull over shirt/dress - Perfomed by helper: Thread/unthread right sleeve Button up shirt - Perfomed by patient: Thread/unthread right sleeve, Thread/unthread left sleeve, Pull shirt around back Button up shirt - Perfomed by helper: Button/unbutton shirt Assist Level: Touching or steadying assistance(Pt > 75%) (assist with buttons) Function - Lower Body Dressing/Undressing What is the patient wearing?: Underwear, Pants, Socks, Shoes Position: Standing at sink Underwear - Performed by patient: Pull underwear up/down, Thread/unthread left underwear leg, Thread/unthread right underwear leg Underwear - Performed by helper: Thread/unthread right underwear leg Pants- Performed by patient: Thread/unthread right pants leg, Thread/unthread left pants leg, Pull pants up/down, Fasten/unfasten pants Pants- Performed by helper:  (none) Socks - Performed by patient: Don/doff right sock, Don/doff left sock Socks - Performed by helper: Don/doff right sock, Don/doff left sock Shoes - Performed by patient: Don/doff right shoe, Don/doff left shoe, Fasten right, Fasten left Shoes - Performed by helper: Don/doff right shoe, Don/doff left shoe Assist Level: Supervision or verbal cues, Set up Set up : To obtain clothing/put away  Function - Toileting Toileting steps completed by patient: Performs perineal hygiene, Adjust clothing after toileting, Adjust clothing prior to toileting Toileting steps completed by helper: Adjust clothing after toileting Toileting Assistive Devices: Grab bar or rail Assist level: Touching or steadying assistance (Pt.75%)  Function - Toilet Transfers Toilet transfer assistive device: Grab bar Assist level to toilet: Supervision or verbal cues Assist level from toilet: Supervision or verbal cues  Function - Chair/bed transfer Chair/bed transfer method:  Ambulatory Chair/bed transfer assist level: Supervision or verbal cues Chair/bed transfer assistive device: Walker, Armrests Chair/bed transfer details: Verbal cues for safe use of DME/AE, Visual cues/gestures for precautions/safety  Function - Locomotion: Wheelchair Will patient use wheelchair at discharge?: No Max wheelchair distance: 150 Assist Level: Supervision or verbal cues Assist Level: Supervision or verbal cues Assist Level: Supervision or verbal cues Function - Locomotion: Ambulation Assistive device: Walker-rolling Max distance:  (in room ambulation) Assist level: Supervision or verbal cues Assist level: Supervision or verbal cues Assist level: Supervision or verbal cues Assist level: Supervision or verbal cues Assist level: Supervision or verbal cues (on blue foam mat)  Function - Comprehension Comprehension: Auditory Comprehension assist level: Understands basic 75 - 89% of the time/ requires cueing 10 - 24% of the time  Function - Expression Expression: Verbal Expression assist level: Expresses basic 25 - 49% of the time/requires cueing 50 - 75% of the time. Uses single words/gestures.  Function - Social Interaction Social Interaction assist level: Interacts appropriately 50 - 74% of the time - May be physically or verbally inappropriate.  Function - Problem Solving Problem solving assist level: Solves basic 25 - 49% of the time - needs direction more than half the time to  initiate, plan or complete simple activities  Function - Memory Memory assist level: Recognizes or recalls 25 - 49% of the time/requires cueing 50 - 75% of the time Patient normally able to recall (first 3 days only): Staff names and faces, That he or she is in a hospital  Medical Problem List and Plan: 1. 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  DVT Prophylaxis/Anticoagulation: Pharmaceutical: Lovenox 3. Chronic pain/Pain Management: Continue hydrocodone prn for OA/DDD.   Chronic low back pain , on hydrocodone at home, resumed dosed, Kpad, analgesic cream 4. Adjustment reaction/ Mood: Team to provide ego support.  LCSW to follow for evaluation and support. Continue Xanax bid.  Low dose 5. Neuropsych: This patient is capable of making decisions on his own behalf. 6. Skin/Wound Care: routine pressure relief measures 7. Fluids/Electrolytes/Nutrition: Monitor I/O.100% meals  Fluid intake good 8. Acute Renal insufficiency: resolved 9.  CAD/AAA:  Stable on ASA and zocor.   10. DM type 2: Will monitor BS ac/hs. Was on lantus 75 and glipizide at home.   lantus to 40 units cont meal coverage for now.  Increase glipizide  Am to help with daytime CBGs (used at home). Elevated pm CBG x2 will monitor for now           11. HTN:  Blood pressures  (110/76). Continue to hold lisinopril and avoid hypotension/hypoperfusion. occ orthostatic drop   12. Insomnia: should improve with increased activity, on remeron, may have element of depresssion but cannot do formal testing due to aphasia 13.  Hx constipation now having cont BMs  14  Chronic complaint Cold feet ABI 6/16 normal  LOS (Days) 13 A FACE TO FACE EVALUATION WAS PERFORMED  KIRSTEINS,ANDREW E 06/09/2015, 7:35 AM

## 2015-06-09 NOTE — Progress Notes (Signed)
Physical Therapy Session Note  Patient Details  Name: Lucas Morgan MRN: [id number] Date of Birth: 08-27-1941  Today's Date: 06/09/2015 PT Individual Time: 4098-1191 PT Individual Time Calculation (min): 81 min   Short Term Goals: Week 2:  PT Short Term Goal 1 (Week 2): STGs = LTGs due to ELOS  Skilled Therapeutic Interventions/Progress Updates:   Pt received in bed; pt performed supine > sit independently.  Pt sat EOB and donned shoes with extra time and verbal cues for use of RUE.  When pt cued to hold rollator for gait pt shaking his head and indicating he wanted to ambulate without it.  Pt performed gait in controlled environment x 150' without AD with supervision.  Reviewed floor <> furniture transfer with pt performing mod I.  For higher level balance, gait and dual task and bi-manual task training pt performed gait forwards, backwards, and laterally in controlled environment while bouncing or tossing basketball up/down with bilat UE with min A and intermittent cues for sequencing as pt would cease ambulating while focusing on tossing or bouncing.  During seated rest breaks pt engaged in conversation about PLOF, home set up, assistance available at D/C, other leisure activities with pt requiring extra time to complete 3-4 word sentences; pt is improving with one and two word answers.  Continued gait training in community environment: on/off elevators, through crowded environments and outside over uneven pavement, up/down 20 stairs with one rail (alternating use of RUE and LUE), over uneven grassy and mulch terrain all with min A overall and cues to attend to R environment, for weight shifting L and full R foot clearance.  Once back on unit reviewed car transfer with pt entering/exiting with supervision stepping into car with LLE first but did require mod-max cues for use of RUE to open/close door and manage seat belt.  Returned to room with supervision with increased R foot slap as pt fatigued and  pt returned to bed independently.  Pt left with all items within reach and bed alarm set.  Therapy Documentation Precautions:  Precautions Precautions: Fall Precaution Comments: expressive aphasia, impulsive Restrictions Weight Bearing Restrictions: No Vital Signs: Therapy Vitals Temp: 97.5 F (36.4 C) (pt had water before I came in room) Temp Source: Oral Pulse Rate: 100 Resp: 18 BP: (!) 126/58 mmHg Patient Position (if appropriate): Lying Oxygen Therapy SpO2: 95 % O2 Device: Not Delivered Pain: Pain Assessment Pain Assessment: No/denies pain Pain Score: 6  Pain Type: Chronic pain Pain Location: Back Pain Orientation: Lower Pain Descriptors / Indicators: Aching;Constant Pain Frequency: Constant Pain Onset: On-going Patients Stated Pain Goal: 2 Pain Intervention(s): Medication (See eMAR)   See Function Navigator for Current Functional Status.   Therapy/Group: Individual Therapy  Edman Circle Faucette 06/09/2015, 2:40 PM

## 2015-06-09 NOTE — Progress Notes (Signed)
Occupational Therapy Session Note  Patient Details  Name: Lucas Morgan MRN: [id number] Date of Birth: November 03, 1940  Today's Date: 06/09/2015 OT Individual Time: 0730-0900 OT Individual Time Calculation (min): 90 min    Short Term Goals: Week 2:  OT Short Term Goal 1 (Week 2): STG=LTG d/t short LOS  Skilled Therapeutic Interventions/Progress Updates: ADL-retraining (60 min) with focus on improved awareness, visual scanning, dynamic standing balance.  Pt received sitting at EOB self-feeding.   Pt required setup assist to open container of raisins and then attempted use of orthotic for right hand to self-feed.    Pt was unable to problem-solve to use right hand effectively and demo's good thoroughness using left hand.  Plan to discontinue goal to use right hand during self-feeding.   Pt then gathered clothing with supervision and ambulated to shower.  Pt undressed sitting on toilet after completing medium BM.   Pt walked to shower from toilet and completed shower standing beside shower chair, sitting only when cued to improve management of washcloths and soap by therapist.   Pt returned to EOB to dress.   From room, pt walked to gym w/o device with supervision and min vc for directions.   At gym, pt completed 30 min of therex to improved RUE function to include seated wrist exercises and 15 min of NMES using Bioness.   Pt returned to his room at end of session in prep for visit from neuropsychologist.  Therapy Documentation Precautions:  Precautions Precautions: Fall Precaution Comments: expressive aphasia, impulsive Restrictions Weight Bearing Restrictions: No   Pain: Pain Assessment Pain Assessment: No/denies pain  See Function Navigator for Current Functional Status.   Therapy/Group: Individual Therapy  BARTHOLD,FRANK 06/09/2015, 10:58 AM

## 2015-06-09 NOTE — Progress Notes (Signed)
Recreational Therapy Session Note  Patient Details  Name: Lucas Morgan MRN: [id number] Date of Birth: 21-Aug-1941 Today's Date: 06/09/2015 Pain: no c/o Skilled Therapeutic Interventions/Progress Updates: Order received and leisure screen completed.  Session focused on functional mobility in a community setting both indoors and outdoors in preparation for discharge this week.  Pt required close supervision-min assist & verbal cues for safety & attending to right environment.  Ambulation included inclines/declines, brick, mulched, & grassy surfaces, up/down steps using 1 rail, elevator access & negotiation crowded environment.  Pt required verbal cuing for right foot clearance throughout session.  No further TR implemented.  Therapy/Group: Co-Treatment SIMPSON,LISA 06/09/2015, 2:58 PM

## 2015-06-09 NOTE — Progress Notes (Signed)
Speech Language Pathology Daily Session Note  Patient Details  Name: Lucas Morgan MRN: [id number] Date of Birth: April 22, 1941  Today's Date: 06/09/2015 SLP Individual Time: 1005-1104 SLP Individual Time Calculation (min): 59 min  Short Term Goals: Week 2: SLP Short Term Goal 1 (Week 2): Pt will attend to right upper extremity and environment during structured tasks with Min verbal and visual cues SLP Short Term Goal 2 (Week 2): Pt will request help as needed during self-care tasks with Mod question cues SLP Short Term Goal 3 (Week 2): Pt will demonstrate basic problem solving with Min verbal and visual cues SLP Short Term Goal 4 (Week 2): Pt will utilize pictures and gestures as non-verbal means of communicating basic needs and wants with Min assist   SLP Short Term Goal 5 (Week 2): Pt will follow 2-3 step directions with Sueprvision level verbal cues SLP Short Term Goal 6 (Week 2): Pt will verbally express phrase length utterances during structured tasks with Mod semantic cues.   Skilled Therapeutic Interventions:  Pt was seen for skilled ST targeting communication goals.  Upon arrival, pt was awake, reclined in bed and very tearful.  Pt reported that he lost his wife in February and today is her birthday.  Despite being visibly upset, pt was agreeable to participating in ST and easily redirected to structured therapeutic tasks. Pt was able to communicate in short phrases (2-4 words) to describe people and actions depicted in black and white line drawings.  Pt required min-mod assist verbal cues for awareness and correction of phonemic paraphasic errors and perseveration.  He initially was noted with telegraphic speech which was lacking in short, functor words such as /is, are, the, and/ but with mod verbal cues and repetition pt demonstrated carryover of targeted skills and began correcting himself (in ~50% of opportunities) when he left out the abovementioned words.  Pt required min assist verbal  cues for slow rate and increased vocal intensity to achieve intelligibility in phrases.  Pt was returned to room and left in bed with bed alarm activated.  Continue per current plan of care.         Function:  Eating Eating                 Cognition Comprehension Comprehension assist level: Follows basic conversation/direction with extra time/assistive device  Expression   Expression assist level: Expresses basic 50 - 74% of the time/requires cueing 25 - 49% of the time. Needs to repeat parts of sentences.  Social Interaction Social Interaction assist level: Interacts appropriately 50 - 74% of the time - May be physically or verbally inappropriate.  Problem Solving Problem solving assist level: Solves basic 50 - 74% of the time/requires cueing 25 - 49% of the time  Memory Memory assist level: Recognizes or recalls 50 - 74% of the time/requires cueing 25 - 49% of the time    Pain Pain Assessment Pain Assessment: No/denies pain   Therapy/Group: Individual Therapy  Jalysa Swopes, Melanee Spry 06/09/2015, 1:19 PM

## 2015-06-10 ENCOUNTER — Inpatient Hospital Stay (HOSPITAL_COMMUNITY): Payer: Medicare Other | Admitting: Speech Pathology

## 2015-06-10 ENCOUNTER — Inpatient Hospital Stay (HOSPITAL_COMMUNITY): Payer: Medicare Other | Admitting: Physical Therapy

## 2015-06-10 ENCOUNTER — Inpatient Hospital Stay (HOSPITAL_COMMUNITY): Payer: Medicare Other | Admitting: Occupational Therapy

## 2015-06-10 LAB — BASIC METABOLIC PANEL
ANION GAP: 8 (ref 5–15)
BUN: 21 mg/dL — ABNORMAL HIGH (ref 6–20)
CALCIUM: 8.8 mg/dL — AB (ref 8.9–10.3)
CO2: 25 mmol/L (ref 22–32)
CREATININE: 1.32 mg/dL — AB (ref 0.61–1.24)
Chloride: 104 mmol/L (ref 101–111)
GFR calc Af Amer: >60 mL/min (ref >60)
GFR calc non Af Amer: 52 mL/min — ABNORMAL LOW (ref >60)
GLUCOSE: 214 mg/dL — AB (ref 65–99)
Potassium: 4.2 mmol/L (ref 3.5–5.1)
Sodium: 137 mmol/L (ref 135–145)

## 2015-06-10 LAB — CREATININE, SERUM
CREATININE: 1.3 mg/dL — AB (ref 0.61–1.24)
GFR calc non Af Amer: 53 mL/min — ABNORMAL LOW (ref >60)

## 2015-06-10 LAB — GLUCOSE, CAPILLARY
GLUCOSE-CAPILLARY: 66 mg/dL (ref 65–99)
GLUCOSE-CAPILLARY: 207 mg/dL — AB (ref 65–99)
Glucose-Capillary: 94 mg/dL (ref 65–99)
Glucose-Capillary: 197 mg/dL — ABNORMAL HIGH (ref 65–99)
Glucose-Capillary: 166 mg/dL — ABNORMAL HIGH (ref 65–99)

## 2015-06-10 MED ORDER — HYDROCODONE-ACETAMINOPHEN 10-325 MG PO TABS
1.0000 | ORAL_TABLET | Freq: Three times a day (TID) | ORAL | Status: DC
  Administered 2015-06-10 – 2015-06-11 (×2): 1 via ORAL
  Filled 2015-06-10 (×2): qty 1

## 2015-06-10 MED ORDER — INSULIN GLARGINE 100 UNIT/ML ~~LOC~~ SOLN
37.0000 [IU] | Freq: Every day | SUBCUTANEOUS | Status: DC
  Administered 2015-06-10: 37 [IU] via SUBCUTANEOUS
  Filled 2015-06-10 (×2): qty 0.37

## 2015-06-10 NOTE — Discharge Summary (Signed)
Physician Discharge Summary  Patient ID: Lucas Morgan MRN: [id number] DOB/AGE: Nov 09, 1940 74 y.o.  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  Recent stroke.  EXAM: CHEST  2 VIEW  COMPARISON:  10/18/2014  FINDINGS: The patient is mildly rotated to the right which partially limits evaluation of the mediastinum. Cardiomediastinal silhouette is grossly unchanged, with cardiac silhouette upper limits of normal in size. The patient has taken a slightly shallower inspiration than on the prior study. Chronic interstitial coarsening is again seen and may reflect underlying COPD/chronic bronchitis. There is slightly increased heterogeneous opacity in the right lung base which projects posteriorly on the lateral image. No pleural effusion or pneumothorax is identified. Degenerative changes are noted at the [facility] joints.  IMPRESSION: COPD with mild right basilar infiltrate versus atelectasis.   Electronically Signed   By: Sebastian Ache M.D.   On: 05/25/2015 12:17    Dg Abd 1 View  05/30/2015   CLINICAL DATA:  Abdominal pain and tenderness  EXAM: ABDOMEN - 1 VIEW  COMPARISON:  04/08/2015  FINDINGS: The bowel gas pattern is normal. Moderate stool burden identified throughout the colon. No radio-opaque calculi or other significant radiographic abnormality are seen.  IMPRESSION: 1. Moderate stool burden suggesting constipation.   Electronically Signed   By: Signa Kell M.D.   On: 05/30/2015 08:58    Ct Angio Neck W/cm &/or Wo/cm  05/28/2015   CLINICAL DATA:  Functional deficits secondary to bilateral multi vascular territory infarcts.  EXAM: CT  ANGIOGRAPHY NECK  TECHNIQUE: Multidetector CT imaging of the neck was performed using the standard protocol during bolus administration of intravenous contrast. Multiplanar CT image reconstructions and MIPs were obtained to evaluate the vascular anatomy. Carotid stenosis measurements (when applicable) are obtained utilizing NASCET criteria, using the distal internal carotid diameter as the denominator.  CONTRAST:  50mL OMNIPAQUE IOHEXOL 350 MG/ML SOLN  COMPARISON:  MRI of the brain May 25, 2015  FINDINGS: Normal appearance of the thoracic arch, normal branch pattern, mild calcific atherosclerosis. The origins of the innominate, left Common carotid artery and subclavian artery are widely patent.  Bilateral Common carotid arteries are widely patent, coursing in a straight line fashion. Mild intimal thickening of the distal Common carotid arteries. Calcific atherosclerosis of bilateral carotid bulbs. 15 mm segment of 50% stenosis RIGHT ICA origin. 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 No pseudoaneurysm. Normal appearance of the included internal carotid arteries.  Left vertebral artery is dominant. Normal appearance of the vertebral arteries, which appear widely patent. RIGHT vertebral artery predominately terminates in the posterior inferior cerebellar artery.  No contrast extravasation.  17 mm LEFT thyroid nodule. LEFT parotid sialolith. Subcentimeter radiopaque foreign body within the RIGHT superficial neck subcutaneous fat may represent catheter fragment or other non metallic object. No acute osseous process though bone windows have not been submitted ; severe RIGHT C5-6 neural foraminal narrowing. Mild bronchial wall thickening and patchy upper lobe airspace opacities which could represent atelectasis  or infiltrate, biapical bullous changes.   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  50% stenosis RIGHT ICA origin.  17 mm LEFT thyroid nodule for which follow-up dedicated thyroid sonogram is recommended on a nonemergent basis.   Electronically Signed   By: Awilda Metro M.D.   On: 05/28/2015 22:33      Labs:  Basic Metabolic Panel: BMP Latest Ref Rng 06/10/2015 06/10/2015 06/03/2015  Glucose 65 - 99 mg/dL 161(W) - -  BUN 6 - 20 mg/dL 96(E) - -  Creatinine 4.54 - 1.24 mg/dL 0.98(J) 1.91(Y) 7.82(N)  Sodium 135 - 145 mmol/L 137 - -  Potassium 3.5 - 5.1 mmol/L 4.2 - -  Chloride 101 - 111 mmol/L 104 - -  CO2 22 - 32 mmol/L 25 - -  Calcium 8.9 - 10.3 mg/dL 5.6(O) - -     CBC: CBC Latest Ref Rng 05/27/2015 05/26/2015 05/25/2015  WBC 4.0 - 10.5 K/uL 8.3 11.6(H) 10.5  Hemoglobin 13.0 - 17.0 g/dL 13.0 86.5 78.4  Hematocrit 39.0 - 52.0 % 44.9 45.7 43.6  Platelets 150 - 400 K/uL 189 201 208     CBG:  Recent Labs Lab 06/10/15 0715 06/10/15 1105 06/10/15 1601 06/10/15 2031 06/11/15 0639  GLUCAP 94 197* 166* 207* 138*     Today's Vitals   06/10/15 1804 06/11/15 0514 06/11/15 0847 06/11/15 0947  BP:  122/53    Pulse:  82    Temp:  98.2 F (36.8 C)    TempSrc:  Oral    Resp:  17    Height:      Weight:      SpO2:  98%    PainSc: 0-No pain  5  1      Brief HPI:   Lucas Morgan is a 74 y.o. RH-male with history of DM type 2, CAD, DDD with neurogenic claudication, recent CVA with aphasia and right sided weakness which had resolved after 3 days and he was discharged from [facility] on 05/23/15. He was readmitted on 30-May-2015 with pronounced right sided weakness and garbled speech. He was started on IVF for dehydration. Dr. Roda Shutters felt that patient had recurrent symptoms secondary to dehydration with hypotension and recommended work up for embolic source   MRI/MRA brain done  revealing acute/subacute hon-hemorrhagic infarct left frontal and additional areas left parietal and left occipital (question hypoperfusion), right parietal and right occipital lobes with question of embolic source. TEE done revealing EF 30-35%, no PFO or ASD and loop recorder was placed by Dr Melburn Popper. Carotid dopplers done revealing 60-79 % R-ICA stenosis. Patient with resultant right sided weakness, right inattention, aphasia with expressive > receptive deficits. Therapy ongoing and CIR was recommended by MD and Rehab team.    Hospital Course: Krystal Eaton was admitted to rehab 05/27/2015 for inpatient therapies to consist of PT, ST and OT at least three hours five days a week. Past admission physiatrist, therapy team and rehab RN have worked together to provide customized collaborative inpatient rehab. Dr. 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  He recommended addition of plavix for stroke prophylaxis.  Dr. Edilia Bo was consulted for input and felt this did not appear to be a clear dissection and patient to follow up next few weeks for L-CEA if he had improvement with rehab.  Blood pressures  have been monitored on bid basis and have been well controlled off lisinopril.  Renal status has been monitored and Rehab RN has been encouraging po fluid intake. Creatinine has stabilized at 1.3.  Diabetes was monitored on ac/hs basis and glipizide was resumed. His blood sugars continue to be variable and lantus was decreased to 37 units to avoid hypoglycemic events.  Patient and family have been instructed on dietary discretion as well as monitoring of BS bid basis with follow up with primary care for further titration of medications after discharge.   He is continent of bowel and bladder. Patient has had bouts of lability due to adjustment reaction.  He continues on Xanax bid and Remeron was added to help with mood as  well as chronic insomnia.  Family expressed concerns that his chronic pain was not being managed appropriately and hydrocodone was resumed as per home regiment. Dr. Jacquelyne Balint has followed for evaluation and support for chronic bereavement issues as well as concerns about being a burden on his family. Patient was advised to follow up with Hospice for grief counseling after discharge. Insomnia has resolved with improvement in mood. Patient has made good progress during his rehab stay and is currently at supervision level. He does continue to be limited by [facility] in addition to being legally blind in left eye as well as expressive aphasia.  Family is supportive and will provide supervision needed after discharge. He will continue to receive follow up HHPT, HHOT, HHST and HHRN by [facility] after discharge.    Rehab course: During patient's stay in rehab weekly team conferences were held to monitor patient's progress, set goals and discuss barriers to discharge. At admission, patient required max assist with self care tasks and min assist with mobility.  He had moderate cognitive impairments with poor awareness of deficits, impulsivity and moderate dysarthria compounding his Broca's aphasia. He has had improvement in activity tolerance, balance, postural control, as well as ability to compensate for deficits. he requires set up assist for bathing and supervision with dressing tasks.  He is has had improvement in functional use RUE  and RLE. He is ambulating 500 feet without AD in controlled environment. BERG score has improved to 51/56.  He is able to expressive basic wants/needs with min to moderate assistance for short simple phrases and gestures. He requires supervision for basic and familiar self care tasks due to decrease in safety awareness, impulsivity and right inattention. Family education was done with daughter regarding need for cognitive needs as well as supervision needed for assistance after  discharge.     Disposition: 01-Home or Self Care  Diet: Diabetic diet. Drink 5 glasses of water daily.   Special Instructions: 1. Check blood sugars twice a day. Eat a snack before bed if BS less than 150 2. Family needs to assist with medications.        Discharge Instructions    Ambulatory referral to Neurology    Complete by:  As directed   Pt will follow up with Dr. Roda Shutters at [facility] in about 2 months. Thanks.            Medication List    TAKE these medications        ALPRAZolam 0.5 MG tablet  Commonly known as:  XANAX  Take 0.5 mg by mouth 2 (two) times daily.     aspirin 325 MG tablet  Take 1 tablet (325 mg total) by mouth daily.     clopidogrel 75 MG tablet  Commonly known as:  PLAVIX  Take 1 tablet (75 mg total) by mouth daily.     glipiZIDE 10 MG tablet  Commonly known as:  GLUCOTROL  Take 0.5 tablets (5 mg total) by mouth daily before breakfast.     HYDROcodone-acetaminophen 10-325 MG tablet  Commonly known as:  NORCO  Take 1 tablet by mouth every 6 (six) hours as needed (pain).     LANTUS SOLOSTAR 100 UNIT/ML Solostar Pen  Generic drug:  Insulin Glargine  Inject 37 Units into the skin at bedtime.     mirtazapine 7.5 MG tablet  Commonly known as:  REMERON  Take 1 tablet (7.5 mg total) by mouth at bedtime. For depression/sleep     MUSCLE RUB 10-15 % Crea  Apply 1 application topically 2 (two) times daily as needed for muscle pain.     senna-docusate 8.6-50 MG tablet  Commonly known as:  Senokot-S  Take 1 tablet by mouth 2 (two) times daily.     simvastatin 40 MG tablet  Commonly known as:  ZOCOR  Take 40 mg by mouth at bedtime.       Follow-up Information    Follow up with Erick Colace, MD On 07/14/2015.   Specialty:  Physical Medicine and Rehabilitation   Why:  Be there at 11 am for 11:30 am appointment   Contact information:   [address] [place] [address] [phone]       Follow up with Xu,Jindong, MD. Schedule  an appointment as soon as possible for a visit in 1 month.   Specialty:  Neurology   Why:  for follow up appointment, stroke clinic   Contact information:   [address] [place] [address] [phone]       Follow up with Waverly Ferrari, MD. Call today.   Specialties:  Vascular Surgery, Cardiology   Why:  for appointment to discuss carotid surgery   Contact information:   [address] [place] [address] [phone]       Follow up with Emilio Math B, PA-C On 07/02/2015.   Specialty:  Physician Assistant   Why:  APPT @ 1;45 PM   Contact information:   [address] [place] [address] [phone]       Signed: Jacquelynn Cree 06/11/2015, 2:06 PM

## 2015-06-10 NOTE — Significant Event (Signed)
Hypoglycemic Event  CBG: 66   Treatment: 15 GM carbohydrate snack  Symptoms: None   Follow-up CBG: Time:0715  CBG Result:94  Possible Reasons for Event: Unknown; possible medication regimen or not having adequate HS snack.   Comments/MD notified: Kirsteins, MD in hallway notified at [phone]. And re-notified @ 0722 in hallway.  MD states he will adjust Glipizide and Lantus appropriately.  No new orders received at this time.    Scarlette Slice K  Remember to initiate Hypoglycemia Order Set & complete

## 2015-06-10 NOTE — Progress Notes (Signed)
Physical Therapy Discharge Summary  Patient Details  Name: EVELYN MOCH MRN: [id number] Date of Birth: [birth year]  Today's Date: 06/10/2015 PT Individual Time: 1446-1530 PT Individual Time Calculation (min): 44 min    Patient has met 8 of 8 long term goals due to improved activity tolerance, improved balance, improved postural control, increased strength, functional use of  right upper extremity and right lower extremity, improved attention, improved awareness and improved coordination.  Patient to discharge at an ambulatory level Supervision.   Patient's care partner is independent to provide the necessary cognitive and supervision assistance at discharge.  Reasons goals not met: All goals met   Recommendation:  Patient will benefit from ongoing skilled PT services in home health setting to continue to advance safe functional mobility, address ongoing impairments in R sided weakness, impaired motor control, impaired core strength/stability and postural control, impaired balance, gait, impaired cognition, and minimize fall risk.  Equipment: No equipment provided  Reasons for discharge: treatment goals met and discharge from hospital  Patient/family agrees with progress made and goals achieved: Yes  PT Discharge Pt received in recliner with shoes on and ready to go to therapy.  Pt performed ambulation to gym without AD with supervision in controlled environment with continued verbal cues needed for full R foot clearance and step length.  Performed re-assessment of balance with BERG, LE sensation and strength; see below.  Continued gait training in crowded community environment and outside over uneven pavement and terrain-mulch/grass, stair negotiation all with supervision and verbal cues for full step length and foot clearance RLE; as pt fatigued R foot drop and lateral hip instability increased.  At end of session pt returned to room to rest in recliner.       Precautions/Restrictions Precautions Precautions: Fall Precaution Comments: expressive aphasia, impulsive Restrictions Weight Bearing Restrictions: No Vital Signs Therapy Vitals Temp: 98.3 F (36.8 C) Temp Source: Oral Pulse Rate: 85 Resp: 18 BP: (!) 109/57 mmHg Patient Position (if appropriate): Lying Oxygen Therapy SpO2: 96 % O2 Device: Not Delivered Pain Pain Assessment Pain Assessment: No/denies pain Vision/Perception  Vision - Assessment Tracking/Visual Pursuits: Left eye does not track medially;Decreased smoothness of horizontal tracking  Cognition Overall Cognitive Status: Impaired/Different from baseline Arousal/Alertness: Awake/alert Orientation Level: Oriented X4 Attention: Sustained Sustained Attention: Appears intact Memory: Impaired Memory Impairment: Decreased recall of new information Awareness: Impaired Awareness Impairment: Emergent impairment Problem Solving: Impaired Problem Solving Impairment: Functional basic;Verbal basic Executive Function: Self Monitoring;Self Correcting Self Monitoring: Impaired Self Monitoring Impairment: Verbal basic;Functional basic Self Correcting: Impaired Self Correcting Impairment: Verbal basic;Functional basic Behaviors: Impulsive Safety/Judgment: Impaired Comments: improved safety awareness and decreased impulsivity; quick release belt utilized due to pt's decreased recall of information, awareness of deficits, and difficulty utilizing call bell  Sensation Sensation Light Touch: Appears Intact Stereognosis: Appears Intact Hot/Cold: Appears Intact Proprioception: Appears Intact Coordination Gross Motor Movements are Fluid and Coordinated: No Fine Motor Movements are Fluid and Coordinated: No (difficulty with fastening buttons, R hand unable to coordination eating utensils) Motor  Motor Motor: Hemiplegia;Abnormal postural alignment and control Motor - Discharge Observations: Continued R sided weakness,  impaired motor control and decreased core and proximal hip stability; stands with slight R hip drop   Mobility Bed Mobility Bed Mobility: Supine to Sit;Sit to Supine Supine to Sit: 6: Modified independent (Device/Increase time) Sit to Supine: 6: Modified independent (Device/Increase time) Transfers Transfers: Yes Sit to Stand: 5: Supervision Stand to Sit: 5: Supervision Stand Pivot Transfers: 5: Supervision Locomotion  Ambulation Ambulation/Gait Assistance: 5: Supervision  Ambulation Distance (Feet): 500 Feet Assistive device: None Ambulation/Gait Assistance Details: Verbal cues for gait pattern;Verbal cues for precautions/safety;Visual cues/gestures for precautions/safety Gait Gait: Yes Gait Pattern: Impaired Gait Pattern: Step-to pattern;Decreased step length - right;Decreased stance time - left;Decreased dorsiflexion - right;Decreased weight shift to left;Lateral hip instability;Poor foot clearance - right;Shuffle Stairs / Additional Locomotion Stairs: Yes Stairs Assistance: 5: Supervision Stairs Assistance Details: Visual cues/gestures for precautions/safety;Visual cues/gestures for sequencing;Verbal cues for sequencing;Verbal cues for precautions/safety Stair Management Technique: One rail Right;One rail Left;Alternating pattern;Forwards Number of Stairs: 20 Height of Stairs: 7 Wheelchair Mobility Wheelchair Mobility: No  Trunk/Postural Assessment  Cervical Assessment Cervical Assessment: Within Functional Limits Thoracic Assessment Thoracic Assessment: Within Functional Limits (slight shortening on R, elongated on L) Lumbar Assessment Lumbar Assessment: Within Functional Limits Postural Control Righting Reactions: in standing-slight R hip drop and bias to L; with external perturbations pt demonstrates ankle, hip and step strategies appropriately but still presents with LOB to R during dual tasking  Balance Standardized Balance Assessment Standardized Balance Assessment:  Berg Balance Test Berg Balance Test Sit to Stand: Able to stand without using hands and stabilize independently Standing Unsupported: Able to stand safely 2 minutes Sitting with Back Unsupported but Feet Supported on Floor or Stool: Able to sit safely and securely 2 minutes Stand to Sit: Sits safely with minimal use of hands Transfers: Able to transfer safely, minor use of hands Standing Unsupported with Eyes Closed: Able to stand 10 seconds safely Standing Ubsupported with Feet Together: Able to place feet together independently and stand 1 minute safely From Standing, Reach Forward with Outstretched Arm: Can reach confidently >25 cm (10") From Standing Position, Pick up Object from Floor: Able to pick up shoe safely and easily From Standing Position, Turn to Look Behind Over each Shoulder: Looks behind from both sides and weight shifts well Turn 360 Degrees: Able to turn 360 degrees safely but slowly Standing Unsupported, Alternately Place Feet on Step/Stool: Able to stand independently and safely and complete 8 steps in 20 seconds Standing Unsupported, One Foot in Front: Able to plae foot ahead of the other independently and hold 30 seconds Standing on One Leg: Able to lift leg independently and hold equal to or more than 3 seconds Total Score: 51 Patient demonstrates increased fall risk as noted by score of 51/56 on Berg Balance Scale.  (<36= high risk for falls, close to 100%; 37-45 significant >80%; 46-51 moderate >50%; 52-55 lower >25%)  Static Sitting Balance Static Sitting - Level of Assistance: 6: Modified independent (Device/Increase time) Dynamic Sitting Balance Dynamic Sitting - Level of Assistance: 5: Stand by assistance Static Standing Balance Static Standing - Level of Assistance: 5: Stand by assistance Dynamic Standing Balance Dynamic Standing - Level of Assistance: 5: Stand by assistance Extremity Assessment  RUE AROM (degrees) Overall AROM Right Upper Extremity: Within  functional limits for tasks performed (R shoulder AROM now 140 degrees, 4th and 5th digits limited extension AROM) RUE Strength RUE Overall Strength Comments: 4/5 shoulder, elbow;  3+/5 wrist and grasp LUE Assessment LUE Assessment: Within Functional Limits RLE Strength RLE Overall Strength Comments: 4/5 overall; decreased functional endurance as pt fatigues LLE Assessment LLE Assessment: Within Functional Limits   See Function Navigator for Current Functional Status.  Raylene Everts Faucette 06/10/2015, 3:12 PM

## 2015-06-10 NOTE — Progress Notes (Signed)
Subjective/Complaints:  Notified by RN regarding low CBG  ROS difficult to obtain, does appear accurate with Y/N nods, no breathing issues , no Nausea or vomiting  Objective: Vital Signs: Blood pressure 109/60, pulse 85, temperature 98.3 F (36.8 C), temperature source Oral, resp. rate 16, height 5' 6"  (1.676 m), weight 73.936 kg (163 lb), SpO2 95 %. No results found. Results for orders placed or performed during the hospital encounter of 05/27/15 (from the past 72 hour(s))  Glucose, capillary     Status: Abnormal   Collection Time: 06/07/15 11:23 AM  Result Value Ref Range   Glucose-Capillary 113 (H) 65 - 99 mg/dL  Glucose, capillary     Status: Abnormal   Collection Time: 06/07/15  4:23 PM  Result Value Ref Range   Glucose-Capillary 338 (H) 65 - 99 mg/dL  Glucose, capillary     Status: Abnormal   Collection Time: 06/07/15  8:45 PM  Result Value Ref Range   Glucose-Capillary 111 (H) 65 - 99 mg/dL  Glucose, capillary     Status: None   Collection Time: 06/08/15  6:40 AM  Result Value Ref Range   Glucose-Capillary 91 65 - 99 mg/dL   Comment 1 Notify RN   Glucose, capillary     Status: None   Collection Time: 06/08/15 11:26 AM  Result Value Ref Range   Glucose-Capillary 81 65 - 99 mg/dL  Glucose, capillary     Status: Abnormal   Collection Time: 06/08/15  4:35 PM  Result Value Ref Range   Glucose-Capillary 239 (H) 65 - 99 mg/dL   Comment 1 Notify RN   Glucose, capillary     Status: Abnormal   Collection Time: 06/08/15  8:42 PM  Result Value Ref Range   Glucose-Capillary 189 (H) 65 - 99 mg/dL  Glucose, capillary     Status: None   Collection Time: 06/09/15  6:33 AM  Result Value Ref Range   Glucose-Capillary 75 65 - 99 mg/dL  Glucose, capillary     Status: None   Collection Time: 06/09/15 11:16 AM  Result Value Ref Range   Glucose-Capillary 92 65 - 99 mg/dL  Glucose, capillary     Status: Abnormal   Collection Time: 06/09/15  4:20 PM  Result Value Ref Range   Glucose-Capillary 177 (H) 65 - 99 mg/dL  Glucose, capillary     Status: Abnormal   Collection Time: 06/09/15  8:29 PM  Result Value Ref Range   Glucose-Capillary 170 (H) 65 - 99 mg/dL  Creatinine, serum     Status: Abnormal   Collection Time: 06/10/15  5:35 AM  Result Value Ref Range   Creatinine, Ser 1.30 (H) 0.61 - 1.24 mg/dL   GFR calc non Af Amer 53 (L) >60 mL/min   GFR calc Af Amer >60 >60 mL/min    Comment: (NOTE) The eGFR has been calculated using the CKD EPI equation. This calculation has not been validated in all clinical situations. eGFR's persistently <60 mL/min signify possible Chronic Kidney Disease.      HEENT: Left pupil opacified with reduced (?absent) vision Cardio: RRR and no murmur Resp: CTA B/L and unlabored GI: BS positive and no bowel sounds Extremity:  No Edema Skin:   Other loop recorder site with eccyhmosis and minmal tenderness, small midline healed back incision Neuro: Lethargic, Cranial Nerve Abnormalities Right central 7, Abnormal Sensory withdraws to pinch on right but unable to assess due to aphasia, Abnormal Motor 3- Right delt, bi, tri, 2- R WE, FE, 3-  R HF, KE 2- ankle, Dysarthric and Aphasic Musc/Skel:  No pain with SLR, back without tenderness , small heald incision Gen NAD   Assessment/Plan: 1. Functional deficits secondary to Bilateral ACA/PCA infarct which require 3+ hours per day of interdisciplinary therapy in a comprehensive inpatient rehab setting. Physiatrist is providing close team supervision and 24 hour management of active medical problems listed below. Physiatrist and rehab team continue to assess barriers to discharge/monitor patient progress toward functional and medical goals.  FIM: Function - Bathing Position: Shower Body parts bathed by patient: Right arm, Left arm, Chest, Abdomen, Front perineal area, Right upper leg, Left upper leg, Right lower leg, Left lower leg, Buttocks Body parts bathed by helper: Back Assist Level:  Supervision or verbal cues Set up : To open containers, To obtain items, To adjust water temperature  Function- Upper Body Dressing/Undressing What is the patient wearing?: Pull over shirt/dress Pull over shirt/dress - Perfomed by patient: Thread/unthread right sleeve, Thread/unthread left sleeve, Put head through opening, Pull shirt over trunk Pull over shirt/dress - Perfomed by helper: Thread/unthread right sleeve Button up shirt - Perfomed by patient: Thread/unthread right sleeve, Thread/unthread left sleeve, Pull shirt around back Button up shirt - Perfomed by helper: Button/unbutton shirt Assist Level: Supervision or verbal cues Function - Lower Body Dressing/Undressing What is the patient wearing?: Underwear, Pants, Socks, Shoes Position: Sitting EOB Underwear - Performed by patient: Thread/unthread right underwear leg, Thread/unthread left underwear leg, Pull underwear up/down Underwear - Performed by helper: Thread/unthread right underwear leg Pants- Performed by patient: Thread/unthread right pants leg, Thread/unthread left pants leg, Fasten/unfasten pants, Pull pants up/down Pants- Performed by helper:  (none) Socks - Performed by patient: Don/doff right sock, Don/doff left sock Socks - Performed by helper: Don/doff right sock, Don/doff left sock Shoes - Performed by patient: Don/doff right shoe, Don/doff left shoe, Fasten right, Fasten left Shoes - Performed by helper: Don/doff right shoe, Don/doff left shoe Assist Level: Supervision or verbal cues Set up : To obtain clothing/put away  Function - Toileting Toileting steps completed by patient: Adjust clothing prior to toileting, Performs perineal hygiene, Adjust clothing after toileting Toileting steps completed by helper: Adjust clothing after toileting Toileting Assistive Devices: Grab bar or rail Assist level: Supervision or verbal cues  Function - Toilet Transfers Toilet transfer assistive device: Grab bar Assist level  to toilet: Supervision or verbal cues Assist level from toilet: Supervision or verbal cues  Function - Chair/bed transfer Chair/bed transfer method: Ambulatory Chair/bed transfer assist level: Supervision or verbal cues Chair/bed transfer assistive device: Walker, Armrests Chair/bed transfer details: Visual cues/gestures for precautions/safety  Function - Locomotion: Wheelchair Will patient use wheelchair at discharge?: No Max wheelchair distance: 150 Assist Level: Supervision or verbal cues Assist Level: Supervision or verbal cues Assist Level: Supervision or verbal cues Function - Locomotion: Ambulation Assistive device: No device Max distance: 500 Assist level: Supervision or verbal cues Assist level: Supervision or verbal cues Assist level: Supervision or verbal cues Assist level: Supervision or verbal cues Assist level: Touching or steadying assistance (Pt > 75%)  Function - Comprehension Comprehension: Auditory Comprehension assist level: Follows basic conversation/direction with extra time/assistive device  Function - Expression Expression: Verbal Expression assist level: Expresses basic 50 - 74% of the time/requires cueing 25 - 49% of the time. Needs to repeat parts of sentences.  Function - Social Interaction Social Interaction assist level: Interacts appropriately 50 - 74% of the time - May be physically or verbally inappropriate.  Function - Problem Solving Problem solving  assist level: Solves basic 50 - 74% of the time/requires cueing 25 - 49% of the time  Function - Memory Memory assist level: Recognizes or recalls 50 - 74% of the time/requires cueing 25 - 49% of the time Patient normally able to recall (first 3 days only): Staff names and faces, That he or she is in a hospital  Medical Problem List and Plan: 1. 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  DVT Prophylaxis/Anticoagulation: Pharmaceutical: Lovenox 3. Chronic pain/Pain Management: Continue hydrocodone prn for OA/DDD.   Chronic low back pain , on hydrocodone at home, resumed dosed, Kpad, analgesic cream 4. Adjustment reaction/ Mood: Team to provide ego support.  LCSW to follow for evaluation and support. Continue Xanax bid.  Low dose 5. Neuropsych: This patient is capable of making decisions on his own behalf. 6. Skin/Wound Care: routine pressure relief measures 7. Fluids/Electrolytes/Nutrition: Monitor I/O.100% meals  Fluid intake good 8. Acute Renal insufficiency: resolved 9.  CAD/AAA:  Stable on ASA and zocor.   10. DM type 2: Will monitor BS ac/hs. Was on lantus 75 and glipizide at home.Reduce   lantus to 35 units cont meal coverage for now.  Increase glipizide  6m to help with daytime CBGs (used at home). Elevated pm CBG x2 will monitor for now           11. HTN:  Blood pressures  (110/76). Continue to hold lisinopril and avoid hypotension/hypoperfusion. occ orthostatic drop   12. Insomnia: should improve with increased activity, on remeron, may have element of depresssion but cannot do formal testing due to aphasia 13.  Hx constipation now having cont BMs  14  Chronic complaint Cold feet ABI 6/16 normal  LOS (Days) 14 A FACE TO FACE EVALUATION WAS PERFORMED  KIRSTEINS,ANDREW E 06/10/2015, 7:10 AM

## 2015-06-10 NOTE — Progress Notes (Signed)
Social Work Patient ID: Lucas Morgan, male   DOB: 03-04-1941, 74 y.o.   MRN: [id number] Met with pt and daughter yesterday to discuss discharge tomorrow.  Both are pleased with his progress and wish his speech was better, but know it will come in time. Have decided to do home health due to pt is concerned about cost and his co-pay for outpatient are 40 dollars per session. daughter feels comfortable with his care and has  Attended therapies with him. Pt is aware to apply for medicaid for assistance with hospital bill. Pt will have 24 hr supervision at home via his daughter.

## 2015-06-10 NOTE — Progress Notes (Signed)
Speech Language Pathology Discharge Summary  Patient Details  Name: Lucas Morgan MRN: [id number] Date of Birth: May 01, 1941  Today's Date: 06/10/2015 SLP Individual Time: 1005-1100 SLP Individual Time Calculation (min): 55 min   Skilled Therapeutic Interventions:  Pt was seen for skilled ST targeting communication goals and completion of family education prior to discharge.  Pt's daughter and grandson were present during first half of therapy session; therefore, SLP provided skilled education regarding pt's current goals and progress in therapies.  SLP also provided skilled compensatory strategy instruction to maximize pt's functional independence for communication in the home environment.  Pt's daughter returned demonstration of recommended cuing techniques during both structured and unstructured therapeutic tasks.  Family education is complete at this time.  Pt required min assist faded to supervision cues for sequencing of basic 3 step picture cards and min-mod assist to verbally describe actions in pictures.  Pt also benefited from min assist verbal cues to verbally identify anomalies in pictures.  Pt ambulated from treatment room back to his room with supervision cues for route recall.  Pt was left in straight back chair per his request with quick release belt donned and call bell within reach.  Pt is on track for discharge tomorrow.      Patient has met 6 of 6 long term goals.  Patient to discharge at [facility] level.  Reasons goals not met:  n/a  Clinical Impression/Discharge Summary:  Pt made significant functional gains while inpatient and is discharging having met 6 out of 6 long term goals.  Currently pt can convey basic needs/wants to caregivers with min-mod assist cues for use of short, simple phrases, slow rate of speech, and gestures to maximize functional communication.  Pt can recognize and correct verbal errors with min assist.  Pt is supervision for completing basic, familiar  self care tasks due to slightly decreased safety awareness.  Pt has demonstrated progress in impulsivity and attending to the right during functional tasks.  He also presents with significantly decreased perseveration which allows him to better communicate with family and staff.  Pt is discharging home with 24/7 supervision from family.  Pt would benefit from ST follow up at next level of care to maximize functional independence and to further reduce burden of care.  Care Partner:  Caregiver Able to Provide Assistance: Yes  Type of Caregiver Assistance: Cognitive;Physical  Recommendation:  Outpatient SLP;24 hour supervision/assistance  Rationale for SLP Follow Up: Maximize functional communication;Reduce caregiver burden;Maximize cognitive function and independence   Equipment:  None recommended by SLP   Reasons for discharge: Discharged from hospital   Patient/Family Agrees with Progress Made and Goals Achieved: Yes   Function:  Eating Eating   Modified Consistency Diet: No Eating Assist Level: Help managing cup/glass;More than reasonable amount of time   Eating Set Up Assist For: Opening containers       Cognition Comprehension Comprehension assist level: Follows basic conversation/direction with extra time/assistive device  Expression   Expression assist level: Expresses basic 50 - 74% of the time/requires cueing 25 - 49% of the time. Needs to repeat parts of sentences.  Social Interaction Social Interaction assist level: Interacts appropriately 75 - 89% of the time - Needs redirection for appropriate language or to initiate interaction.  Problem Solving Problem solving assist level: Solves basic 50 - 74% of the time/requires cueing 25 - 49% of the time  Memory Memory assist level: Recognizes or recalls 25 - 49% of the time/requires cueing 50 - 75% of the  time   Emilio Math 06/10/2015, 12:47 PM

## 2015-06-10 NOTE — Consult Note (Signed)
INITIAL DIAGNOSTIC EVALUATION - [facility]IAL [facility]   MEDICAL NECESSITY:  Lucas Morgan was seen on the [facility] for an initial diagnostic evaluation owing to the patient's diagnosis of embolic stroke.   According to medical records, Lucas Morgan was admitted to the rehab unit owing to "Functional deficits secondary to bilateral embolic ACA/PCA infarcts." Records also indicate that he is a "74 y.o. RH-male with history of DM type 2, CAD, DDD with neurogenic claudication, CVA with aphasia and right sided weakness which had resolved after 3 days and he was discharged from [facility] on 05/23/15.  He was readmitted on 06/23/15 with pronounced right sided weakness and garbled speech. He was noted to be dehydrated with BUN/Cr- 37/1.87 and was started on IVF for hydration. CT head revealed well defined area of cytotoxic edema left superior frontal cortex and subcortical white matter consistent with late acute cerebral infarction.    MRI/MRA brain done revealing acute/subacute non-hemorrhagic infarct left frontal and additional areas left parietal and left occipital (question hypoperfusion), right parietal and right occipital lobes with question of embolic source."    Lucas Morgan has reportedly been exhibiting significant dysphasia post-stroke. He is also still grieving the death of his wife which occurred in February. Rehab staff specifically referred this patient to neuropsychology to assess his speech changes and assist with coping and grief symptoms.    During today's visit, Lucas Morgan was very difficult to understand owing to severe dysphasia. 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 Patient denied experiencing any cognitive symptoms.  From an emotional standpoint, Lucas Morgan reportedly is still grieving  the passing of his wife which took place in February of this year. Her passing was unexpected. He was very tearful today, particularly because it was her birthday. He has no previous history of mental health issues or treatment. He has been somewhat stressed of late owing to financial concerns mostly related to multiple medical bills and his present medical situation. He also expressed concern about being a burden on his family. Suicidal/homicidal ideation, plan or intent was denied. No manic or hypomanic episodes were reported. The patient denied ever experiencing any auditory/visual hallucinations. No major behavioral or personality changes were endorsed.   Patient feels that progress is being made in therapy. No barriers to therapy identified. He was living with his daughter and plans to discharge back to that living situation. He is happy with that arrangement.   PROCEDURES: [1 unit 90791] Diagnostic clinical interview  Review of available records    IMPRESSION: Lucas Morgan denied suffering from any cognitive symptoms post-stroke, though he is clearly dysphasic (most similar to Broca's). A majority of the time was spent discussing chronic bereavement and normalizing his feeling of being a burden on his family. I discussed the benefits of participation in grief counseling upon discharge as well as participation in a stroke survivor support group. In addition, I feel that initiating an antidepressant (if not medically contraindicated) is warranted and may be helpful. Lastly, patient wanted to speak with social work about any possible financial assistance that might be available. This was relayed to his Child psychotherapist. Given that he is discharging soon, neuropsychology does not plan to follow-up unless requested by the patient or staff.   DIAGNOSES:  Chronic bereavement  Adjustment disorder with anxious mood    Debbe Mounts, Psy.D.  Clinical Neuropsychologist

## 2015-06-10 NOTE — Progress Notes (Signed)
Occupational Therapy Discharge Summary  Patient Details  Name: Lucas Morgan MRN: [id number] Date of Birth: [birth year]   Patient has met 12 of 12 long term goals due to improved activity tolerance, improved balance, postural control, ability to compensate for deficits, functional use of  RIGHT upper and RIGHT lower extremity with improved R shoulder AROM and strength and improving grasp, improved attention, improved awareness and improved coordination. Pt has progressed from mod A with self care. Patient to discharge at overall Supervision level.  Patient's care partner is independent to provide the necessary physical and cognitive assistance at discharge.    Reasons goals not met: n/a  Recommendation:  Patient will benefit from ongoing skilled OT services in home health setting to continue to advance functional skills in the area of BADL and iADL. Limited active extension of 4th and 5th digits. Pt does have tight flexor tendons similar to trigger finger in R hand on those digits. Educated family and pt on self massage over those tendons and using coban wrap to buddy tape those fingers to 3rd digit for A/AROM. Provided daughter with roll of coban.  Recommended to family pt should only do meal prep with cold items as the stove top and oven are high risk for burns.  Equipment: No equipment provided  Reasons for discharge: treatment goals met  Patient/family agrees with progress made and goals achieved: Yes  OT Discharge Precautions/Restrictions  Precautions Precautions: Fall Precaution Comments: expressive aphasia, impulsive Restrictions Weight Bearing Restrictions: No ADL  refer to functional navigatior Vision/Perception  Vision- History Baseline Vision/History: Legally blind (blind L eye) Vision- Assessment Tracking/Visual Pursuits: Left eye does not track medially;Decreased smoothness of horizontal tracking Visual Fields: Right homonymous hemianopsia  Cognition Overall  Cognitive Status: Impaired/Different from baseline Arousal/Alertness: Awake/alert Orientation Level: Oriented X4 Attention: Sustained Sustained Attention: Appears intact Memory: Impaired Memory Impairment: Decreased recall of new information Awareness: Impaired Awareness Impairment: Emergent impairment Problem Solving: Impaired Problem Solving Impairment: Functional basic;Verbal basic Executive Function: Self Monitoring;Self Correcting Self Monitoring: Impaired Self Monitoring Impairment: Verbal basic;Functional basic Self Correcting: Impaired Self Correcting Impairment: Verbal basic;Functional basic Behaviors: Impulsive Safety/Judgment: Impaired Comments: improved safety awareness and decreased impulsivity; quick release belt utilized due to pt's decreased recall of information, awareness of deficits, and difficulty utilizing call bell  Sensation Sensation Light Touch: Appears Intact Stereognosis: Appears Intact Hot/Cold: Appears Intact Proprioception: Appears Intact Coordination Gross Motor Movements are Fluid and Coordinated: No Fine Motor Movements are Fluid and Coordinated: No (difficulty with fastening buttons, R hand unable to coordination eating utensils) Motor  Motor Motor - Discharge Observations: improved RUE distal strength, limited coordination of R hand/wrist with finger extensor weakness Mobility    refer to functional navigator Trunk/Postural Assessment  Postural Control Righting Reactions: min guarding required as pt may lose balance to his R  Balance Static Sitting Balance Static Sitting - Level of Assistance: 6: Modified independent (Device/Increase time) Dynamic Sitting Balance Dynamic Sitting - Level of Assistance: 5: Stand by assistance Static Standing Balance Static Standing - Level of Assistance: 5: Stand by assistance Dynamic Standing Balance Dynamic Standing - Level of Assistance: 5: Stand by assistance Extremity/Trunk Assessment RUE AROM  (degrees) Overall AROM Right Upper Extremity: Within functional limits for tasks performed (R shoulder AROM now 140 degrees, 4th and 5th digits limited extension AROM) RUE Strength RUE Overall Strength Comments: 4/5 shoulder, elbow;  3+/5 wrist and grasp LUE Assessment LUE Assessment: Within Functional Limits   See Function Navigator for Current Functional Status.  Glandorf 06/10/2015, 12:49 PM

## 2015-06-10 NOTE — Progress Notes (Signed)
Occupational Therapy Session Note  Patient Details  Name: Lucas Morgan MRN: [id number] Date of Birth: 1941-02-17  Today's Date: 06/10/2015 OT Individual Time: [id number] OT Individual Time Calculation (min): 89 min    Short Term Goals: No short term goals set  Skilled Therapeutic Interventions/Progress Updates:    Pt seen this session to facilitate safety awareness, R side awareness, dynamic standing balance, problem solving, and use of RUE with self care of shower in ADL apt bathtub and simple meal prep.  Pt gathered clothing from closets and drawers with close S and cues for safely reaching out of his base of support.  Pt ambulated to ADL apt with contact guard through RUE for increased safety due to R inattention. Pt able to get in and out of tub with bench (he has one at home) and completed shower with S.  He can don tshirt with S, but needs min A to complete fastening buttons.  Pt does actively use R hand throughout ADLs.   Pt asked to make a fried egg, pt able to retrieve items needed using R hand. Reached to floor level to retrieve pan from bottom shelf in back of cabinet.  He was able to retrieve items from cupboards and refrigerator.  He needed mod-max A throughout task due to safety concerns of burning his R hand with R inattention and incoordination. Pt continually tried to use R hand even though he could not coordinate with it well. Pt demonstrated excellent initiation and motivation, but was also impulsive. Pt's daughter arrived for family education. Reviewed treatment session, and recommendations for pt to not do any meal prep with knives, oven, stove top.  He could make a cold sandwich with set up. To work on coordination, pt should wash dishes and fold clothing. Pt and daughter agreeable to recommendations. Also discussed R hand. Limited active extension of 4th and 5th digits. Pt does have tight flexor tendons similar to trigger finger in R hand on those digits. Educated family and pt  on self massage over those tendons and using coban wrap to buddy tape those fingers to 3rd digit for A/AROM. Provided daughter with roll of coban.  Pt returned to room, sat on bed with bed alarm on.  Therapy Documentation Precautions:  Precautions Precautions: Fall Precaution Comments: expressive aphasia, impulsive Restrictions Weight Bearing Restrictions: No    Pain: Pain Assessment Pain Assessment: No/denies pain ADL:  See Function Navigator for Current Functional Status.   Therapy/Group: Individual Therapy  SAGUIER,JULIA 06/10/2015, 12:25 PM

## 2015-06-11 ENCOUNTER — Other Ambulatory Visit: Payer: Self-pay

## 2015-06-11 DIAGNOSIS — G894 Chronic pain syndrome: Secondary | ICD-10-CM

## 2015-06-11 DIAGNOSIS — I6522 Occlusion and stenosis of left carotid artery: Secondary | ICD-10-CM

## 2015-06-11 DIAGNOSIS — F4322 Adjustment disorder with anxiety: Secondary | ICD-10-CM | POA: Diagnosis present

## 2015-06-11 LAB — GLUCOSE, CAPILLARY: GLUCOSE-CAPILLARY: 138 mg/dL — AB (ref 65–99)

## 2015-06-11 MED ORDER — SENNOSIDES-DOCUSATE SODIUM 8.6-50 MG PO TABS: 1.0000 | ORAL_TABLET | Freq: Two times a day (BID) | ORAL | Status: DC

## 2015-06-11 MED ORDER — HYDROCODONE-ACETAMINOPHEN 10-325 MG PO TABS: 1.0000 | ORAL_TABLET | Freq: Four times a day (QID) | ORAL | Status: DC | PRN

## 2015-06-11 MED ORDER — MUSCLE RUB 10-15 % EX CREA: 1.0000 "application " | TOPICAL_CREAM | Freq: Two times a day (BID) | CUTANEOUS | Status: DC | PRN

## 2015-06-11 MED ORDER — CLOPIDOGREL BISULFATE 75 MG PO TABS: 75.0000 mg | ORAL_TABLET | Freq: Every day | ORAL | Status: DC

## 2015-06-11 MED ORDER — LANTUS SOLOSTAR 100 UNIT/ML ~~LOC~~ SOPN: 37.0000 [IU] | PEN_INJECTOR | Freq: Every day | SUBCUTANEOUS | Status: DC

## 2015-06-11 MED ORDER — GLIPIZIDE 10 MG PO TABS: 5.0000 mg | ORAL_TABLET | Freq: Every day | ORAL | Status: DC

## 2015-06-11 MED ORDER — MIRTAZAPINE 7.5 MG PO TABS: 7.5000 mg | ORAL_TABLET | Freq: Every day | ORAL | Status: DC

## 2015-06-11 NOTE — Progress Notes (Addendum)
Subjective/Complaints:  Pt eating breakfast this am  ROS difficult to obtain, does appear accurate with Y/N nods, no breathing issues , no Nausea or vomiting  Objective: Vital Signs: Blood pressure 122/53, pulse 82, temperature 98.2 F (36.8 C), temperature source Oral, resp. rate 17, height 5' 6" (1.676 m), weight 73.936 kg (163 lb), SpO2 98 %. No results found. Results for orders placed or performed during the hospital encounter of 05/27/15 (from the past 72 hour(s))  Glucose, capillary     Status: None   Collection Time: 06/08/15 11:26 AM  Result Value Ref Range   Glucose-Capillary 81 65 - 99 mg/dL  Glucose, capillary     Status: Abnormal   Collection Time: 06/08/15  4:35 PM  Result Value Ref Range   Glucose-Capillary 239 (H) 65 - 99 mg/dL   Comment 1 Notify RN   Glucose, capillary     Status: Abnormal   Collection Time: 06/08/15  8:42 PM  Result Value Ref Range   Glucose-Capillary 189 (H) 65 - 99 mg/dL  Glucose, capillary     Status: None   Collection Time: 06/09/15  6:33 AM  Result Value Ref Range   Glucose-Capillary 75 65 - 99 mg/dL  Glucose, capillary     Status: None   Collection Time: 06/09/15 11:16 AM  Result Value Ref Range   Glucose-Capillary 92 65 - 99 mg/dL  Glucose, capillary     Status: Abnormal   Collection Time: 06/09/15  4:20 PM  Result Value Ref Range   Glucose-Capillary 177 (H) 65 - 99 mg/dL  Glucose, capillary     Status: Abnormal   Collection Time: 06/09/15  8:29 PM  Result Value Ref Range   Glucose-Capillary 170 (H) 65 - 99 mg/dL  Creatinine, serum     Status: Abnormal   Collection Time: 06/10/15  5:35 AM  Result Value Ref Range   Creatinine, Ser 1.30 (H) 0.61 - 1.24 mg/dL   GFR calc non Af Amer 53 (L) >60 mL/min   GFR calc Af Amer >60 >60 mL/min    Comment: (NOTE) The eGFR has been calculated using the CKD EPI equation. This calculation has not been validated in all clinical situations. eGFR's persistently <60 mL/min signify possible  Chronic Kidney Disease.   Glucose, capillary     Status: None   Collection Time: 06/10/15  6:54 AM  Result Value Ref Range   Glucose-Capillary 66 65 - 99 mg/dL  Glucose, capillary     Status: None   Collection Time: 06/10/15  7:15 AM  Result Value Ref Range   Glucose-Capillary 94 65 - 99 mg/dL  Glucose, capillary     Status: Abnormal   Collection Time: 06/10/15 11:05 AM  Result Value Ref Range   Glucose-Capillary 197 (H) 65 - 99 mg/dL  Basic metabolic panel     Status: Abnormal   Collection Time: 06/10/15 12:00 PM  Result Value Ref Range   Sodium 137 135 - 145 mmol/L   Potassium 4.2 3.5 - 5.1 mmol/L   Chloride 104 101 - 111 mmol/L   CO2 25 22 - 32 mmol/L   Glucose, Bld 214 (H) 65 - 99 mg/dL   BUN 21 (H) 6 - 20 mg/dL   Creatinine, Ser 1.32 (H) 0.61 - 1.24 mg/dL   Calcium 8.8 (L) 8.9 - 10.3 mg/dL   GFR calc non Af Amer 52 (L) >60 mL/min   GFR calc Af Amer >60 >60 mL/min    Comment: (NOTE) The eGFR has been calculated using  the CKD EPI equation. This calculation has not been validated in all clinical situations. eGFR's persistently <60 mL/min signify possible Chronic Kidney Disease.    Anion gap 8 5 - 15  Glucose, capillary     Status: Abnormal   Collection Time: 06/10/15  4:01 PM  Result Value Ref Range   Glucose-Capillary 166 (H) 65 - 99 mg/dL  Glucose, capillary     Status: Abnormal   Collection Time: 06/10/15  8:31 PM  Result Value Ref Range   Glucose-Capillary 207 (H) 65 - 99 mg/dL  Glucose, capillary     Status: Abnormal   Collection Time: 06/11/15  6:39 AM  Result Value Ref Range   Glucose-Capillary 138 (H) 65 - 99 mg/dL     HEENT: Left pupil opacified with reduced (?absent) vision Cardio: RRR and no murmur Resp: CTA B/L and unlabored GI: BS positive and no bowel sounds Extremity:  No Edema Skin:   Other loop recorder site with eccyhmosis and minmal tenderness, small midline healed back incision Neuro: Lethargic, Cranial Nerve Abnormalities Right central 7,  Abnormal Sensory withdraws to pinch on right but unable to assess due to aphasia, Abnormal Motor 3- Right delt, bi, tri, 2- R WE, FE, 3- R HF, KE 2- ankle, Dysarthric and Aphasic Musc/Skel:  No pain with SLR, back without tenderness , small heald incision Gen NAD   Assessment/Plan: 1. Functional deficits secondary to Bilateral ACA/PCA infarct Stable for D/C today F/u PCP in 1-2 weeks F/u PM&R 3 weeks See D/C summary See D/C instructions FIM: Function - Bathing Position: Shower Body parts bathed by patient: Right arm, Left arm, Chest, Abdomen, Front perineal area, Right upper leg, Left upper leg, Right lower leg, Left lower leg, Buttocks Body parts bathed by helper: Back Assist Level: Supervision or verbal cues Set up : To open containers, To obtain items, To adjust water temperature  Function- Upper Body Dressing/Undressing What is the patient wearing?: Button up shirt Pull over shirt/dress - Perfomed by patient: Thread/unthread right sleeve, Thread/unthread left sleeve, Put head through opening, Pull shirt over trunk Pull over shirt/dress - Perfomed by helper: Thread/unthread right sleeve Button up shirt - Perfomed by patient: Thread/unthread right sleeve, Thread/unthread left sleeve, Pull shirt around back Button up shirt - Perfomed by helper: Button/unbutton shirt Assist Level: Supervision or verbal cues Function - Lower Body Dressing/Undressing What is the patient wearing?: Underwear, Pants, Socks, Shoes Position: Sitting EOB Underwear - Performed by patient: Thread/unthread right underwear leg, Thread/unthread left underwear leg, Pull underwear up/down Underwear - Performed by helper: Thread/unthread right underwear leg Pants- Performed by patient: Thread/unthread right pants leg, Thread/unthread left pants leg, Fasten/unfasten pants, Pull pants up/down Pants- Performed by helper:  (none) Socks - Performed by patient: Don/doff right sock, Don/doff left sock Socks - Performed  by helper: Don/doff right sock, Don/doff left sock Shoes - Performed by patient: Don/doff right shoe, Don/doff left shoe, Fasten right, Fasten left Shoes - Performed by helper: Don/doff right shoe, Don/doff left shoe Assist Level: Supervision or verbal cues Set up : To obtain clothing/put away  Function - Toileting Toileting steps completed by patient: Adjust clothing prior to toileting, Performs perineal hygiene, Adjust clothing after toileting Toileting steps completed by helper: Adjust clothing prior to toileting [place]: Grab bar or rail Assist level: Supervision or verbal cues  Function - Toilet Transfers Toilet transfer assistive device: Grab bar Assist level to toilet: Supervision or verbal cues Assist level from toilet: Supervision or verbal cues  Function - Chair/bed transfer  Chair/bed transfer method: Ambulatory Chair/bed transfer assist level: Supervision or verbal cues Chair/bed transfer assistive device: Armrests Chair/bed transfer details: Verbal cues for precautions/safety, Visual cues/gestures for precautions/safety  Function - Locomotion: Wheelchair Will patient use wheelchair at discharge?: No Wheelchair activity did not occur: N/A Max wheelchair distance: 150 Assist Level: Supervision or verbal cues Wheel 50 feet with 2 turns activity did not occur: N/A Assist Level: Supervision or verbal cues Wheel 150 feet activity did not occur: N/A Assist Level: Supervision or verbal cues Function - Locomotion: Ambulation Assistive device: No device Max distance: 500 Assist level: Supervision or verbal cues Assist level: Supervision or verbal cues Assist level: Supervision or verbal cues Assist level: Supervision or verbal cues Assist level: Supervision or verbal cues  Function - Comprehension Comprehension: Auditory Comprehension assist level: Follows complex conversation/direction with extra time/assistive device  Function -  Expression Expression: Verbal Expression assist level: Expresses basic 50 - 74% of the time/requires cueing 25 - 49% of the time. Needs to repeat parts of sentences.  Function - Social Interaction Social Interaction assist level: Interacts appropriately 75 - 89% of the time - Needs redirection for appropriate language or to initiate interaction.  Function - Problem Solving Problem solving assist level: Solves basic 50 - 74% of the time/requires cueing 25 - 49% of the time  Function - Memory Memory assist level: Recognizes or recalls 25 - 49% of the time/requires cueing 50 - 75% of the time Patient normally able to recall (first 3 days only): Staff names and faces, That he or she is in a hospital  Medical Problem List and Plan: 1. 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  DVT Prophylaxis/Anticoagulation: Pharmaceutical: Lovenox 3. Chronic pain/Pain Management: Continue hydrocodone prn for OA/DDD.   Chronic low back pain , on hydrocodone at home, resumed dosed, Kpad, analgesic cream 4. Adjustment reaction/ Mood: Team to provide ego support.  LCSW to follow for evaluation and support. Continue Xanax bid.  Low dose 5. Neuropsych: This patient is capable of making decisions on his own behalf. 6. Skin/Wound Care: routine pressure relief measures 7. Fluids/Electrolytes/Nutrition: Monitor I/O.100% meals  Fluid intake good 8. Acute Renal on chronic- GFR stable in the 50s 9.  CAD/AAA:  Stable on ASA and zocor.   10. DM type 2: Will monitor BS ac/hs. Was on lantus 75 and glipizide at home.Reduce   lantus to 35 units cont meal coverage for now.  Increase glipizide  56m to help with daytime CBGs (used at home). CBGs improving        11. HTN:  Blood pressures  (110/76). Continue to hold lisinopril and avoid  hypotension/hypoperfusion. occ orthostatic drop   12. Insomnia: should improve with increased activity, on remeron, may have element of depresssion but cannot do formal testing due to aphasia, appreciate Neuropsych input 13.  Hx constipation now having cont BMs  14  Chronic complaint Cold feet ABI 6/16 normal  LOS (Days) 15 A FACE TO FACE EVALUATION WAS PERFORMED  KIRSTEINS,ANDREW E 06/11/2015, 7:49 AM

## 2015-06-11 NOTE — Progress Notes (Signed)
Patient and son received discharge instructions from Marissa Nestle, PA-C with verbal understanding. Patient discharged to home with son and belongings.

## 2015-06-11 NOTE — Discharge Instructions (Signed)
Inpatient Rehab Discharge Instructions  Lucas Morgan Discharge date and time:   06/11/15  Activities/Precautions/ Functional Status: Activity: activity as tolerated No driving  Diet: cardiac diet and diabetic diet Need to drink at least 5 glasses of water daily to avoid dehydration.  Wound Care: none needed   Functional status:  ___ No restrictions     ___ Walk up steps independently _X__ 24/7 supervision/assistance   ___ Walk up steps with assistance ___ Intermittent supervision/assistance  ___ Bathe/dress independently _X__ Walk with walker    _X__ Bathe/dress with assistance ___ Walk Independently    ___ Shower independently ___ Walk with assistance    ___ Shower with assistance _X__ No alcohol     ___ Return to work/school ________   Special Instructions: 1. Check blood sugars twice a day--once at bedtime. Eat a snack before bed if blood sugar is less than 150.  2. Family needs to help administer medication.    COMMUNITY REFERRALS UPON DISCHARGE:    Home Health:   PT, SP, RN, OT  Agency:[facility]     Phone: Date of last service:06/11/2015   Medical Equipment/Items Ordered:NO NEEDS    Other:[facility] [phone]  GENERAL COMMUNITY RESOURCES FOR PATIENT/FAMILY: Support Groups:[facility] SUPPORT GROUP  STROKE/TIA DISCHARGE INSTRUCTIONS SMOKING Cigarette smoking nearly doubles your risk of having a stroke & is the single most alterable risk factor  If you smoke or have smoked in the last 12 months, you are advised to quit smoking for your health.  Most of the excess cardiovascular risk related to smoking disappears within a year of stopping.  Ask you doctor about anti-smoking medications  [place] Quit Line: 1-800-QUIT NOW  Free Smoking Cessation Classes [phone]  CHOLESTEROL Know your levels; limit fat & cholesterol in your diet  Lipid Panel     Component Value Date/Time   CHOL 119 05/25/2015 0705   TRIG 129  05/25/2015 0705   HDL 33* 05/25/2015 0705   CHOLHDL 3.6 05/25/2015 0705   VLDL 26 05/25/2015 0705   LDLCALC 60 05/25/2015 0705      Many patients benefit from treatment even if their cholesterol is at goal.  Goal: Total Cholesterol (CHOL) less than 160  Goal:  Triglycerides (TRIG) less than 150  Goal:  HDL greater than 40  Goal:  LDL (LDLCALC) less than 100   BLOOD PRESSURE American Stroke Association blood pressure target is less that 120/80 mm/Hg  Your discharge blood pressure is:  BP: 107/62 mmHg  Monitor your blood pressure  Limit your salt and alcohol intake  Many individuals will require more than one medication for high blood pressure  DIABETES (A1c is a blood sugar average for last 3 months) Goal HGBA1c is under 7% (HBGA1c is blood sugar average for last 3 months)  Diabetes:     Lab Results  Component Value Date   HGBA1C 7.5* 05/25/2015     Your HGBA1c can be lowered with medications, healthy diet, and exercise.  Check your blood sugar as directed by your physician  Call your physician if you experience unexplained or low blood sugars.  PHYSICAL ACTIVITY/REHABILITATION Goal is 30 minutes at least 4 days per week  Activity: No driving, Therapies:  See above Return to work:  N/A  Activity decreases your risk of heart attack and stroke and makes your heart stronger.  It helps control your weight and blood pressure; helps you relax and can improve your mood.  Participate in a regular exercise program.  Talk with your doctor about the best form of exercise for you (dancing, walking, swimming, cycling).  DIET/WEIGHT Goal is to maintain a healthy weight  Your discharge diet is: Diet Carb Modified Fluid consistency:: Thin; Room service appropriate?: Yes  liquids Your height is:  Height:  (167.6 cm) Your current weight is: Weight: 73.936 kg (163 lb) Your Body Mass Index (BMI) is:  BMI (Calculated): 25.6  Following the type of diet specifically designed for you  will help prevent another stroke.  Your goal weight is:  155 lbs  Your goal Body Mass Index (BMI) is 19-24.  Healthy food habits can help reduce 3 risk factors for stroke:  High cholesterol, hypertension, and excess weight.  RESOURCES Stroke/Support Group:  Call [phone]   STROKE EDUCATION PROVIDED/REVIEWED AND GIVEN TO PATIENT Stroke warning signs and symptoms How to activate emergency medical system (call 911). Medications prescribed at discharge. Need for follow-up after discharge. Personal risk factors for stroke. Pneumonia vaccine given:  Flu vaccine given:  My questions have been answered, the writing is legible, and I understand these instructions.  I will adhere to these goals & educational materials that have been provided to me after my discharge from the hospital.      My questions have been answered and I understand these instructions. I will adhere to these goals and the provided educational materials after my discharge from the hospital.  Patient/Caregiver Signature _______________________________ Date __________  Clinician Signature _______________________________________ Date __________  Please bring this form and your medication list with you to all your follow-up doctor's appointments.

## 2015-06-11 NOTE — Progress Notes (Signed)
Social Work  Discharge Note  The overall goal for the admission was met for:   Discharge location: Yes-HOME WITH DAUGHTER-WHO IS TAKING A FMLA FROM WORK-TO PROVIDE 24 HR CARE  Length of Stay: Yes-15 DAYS  Discharge activity level: Yes-SUPERVISION LEVEL  Home/community participation: Yes  Services provided included: MD, RD, PT, OT, SLP, RN, CM, TR, Pharmacy, Neuropsych and SW  Financial Services: Private Insurance: [facility]  Follow-up services arranged: Home Health: [facility]-PT,OT,RN,SP and Patient/Family has no preference for HH/DME agencies  Comments (or additional information):[name] WAS HERE FOR FAMILY EDUCATION AND WILL BE PROVIDING 24 HR CARE TO PT AT HOME. BOTH COMFORTABLE WITH DISCHARGE TODAY  Patient/Family verbalized understanding of follow-up arrangements: Yes  Individual responsible for coordination of the follow-up plan: [name]-DAUGHTER  Confirmed correct DME delivered: Elease Hashimoto 06/11/2015    Elease Hashimoto

## 2015-06-13 ENCOUNTER — Inpatient Hospital Stay (HOSPITAL_COMMUNITY): Payer: Medicare Other | Admitting: Speech Pathology

## 2015-06-14 ENCOUNTER — Emergency Department (HOSPITAL_COMMUNITY): Payer: Medicare Other

## 2015-06-14 ENCOUNTER — Inpatient Hospital Stay (HOSPITAL_COMMUNITY)
Admission: EM | Admit: 2015-06-14 | Discharge: 2015-06-19 | DRG: 038 | Disposition: A | Discharge status: Skilled Nursing Facility | Payer: Medicare Other | Attending: Internal Medicine | Admitting: Internal Medicine

## 2015-06-14 ENCOUNTER — Encounter (HOSPITAL_COMMUNITY): Payer: Self-pay | Admitting: Emergency Medicine

## 2015-06-14 DIAGNOSIS — I2510 Atherosclerotic heart disease of native coronary artery without angina pectoris: Secondary | ICD-10-CM | POA: Diagnosis present

## 2015-06-14 DIAGNOSIS — Z888 Allergy status to other drugs, medicaments and biological substances status: Secondary | ICD-10-CM

## 2015-06-14 DIAGNOSIS — M4806 Spinal stenosis, lumbar region: Secondary | ICD-10-CM | POA: Diagnosis present

## 2015-06-14 DIAGNOSIS — Z7982 Long term (current) use of aspirin: Secondary | ICD-10-CM

## 2015-06-14 DIAGNOSIS — I252 Old myocardial infarction: Secondary | ICD-10-CM

## 2015-06-14 DIAGNOSIS — Z955 Presence of coronary angioplasty implant and graft: Secondary | ICD-10-CM

## 2015-06-14 DIAGNOSIS — Z981 Arthrodesis status: Secondary | ICD-10-CM

## 2015-06-14 DIAGNOSIS — H538 Other visual disturbances: Secondary | ICD-10-CM | POA: Diagnosis present

## 2015-06-14 DIAGNOSIS — I63412 Cerebral infarction due to embolism of left middle cerebral artery: Secondary | ICD-10-CM

## 2015-06-14 DIAGNOSIS — R42 Dizziness and giddiness: Secondary | ICD-10-CM | POA: Diagnosis not present

## 2015-06-14 DIAGNOSIS — H5442 Blindness, left eye, normal vision right eye: Secondary | ICD-10-CM | POA: Diagnosis present

## 2015-06-14 DIAGNOSIS — Z79899 Other long term (current) drug therapy: Secondary | ICD-10-CM

## 2015-06-14 DIAGNOSIS — Z794 Long term (current) use of insulin: Secondary | ICD-10-CM

## 2015-06-14 DIAGNOSIS — Z8249 Family history of ischemic heart disease and other diseases of the circulatory system: Secondary | ICD-10-CM

## 2015-06-14 DIAGNOSIS — I69351 Hemiplegia and hemiparesis following cerebral infarction affecting right dominant side: Secondary | ICD-10-CM

## 2015-06-14 DIAGNOSIS — I6529 Occlusion and stenosis of unspecified carotid artery: Secondary | ICD-10-CM | POA: Diagnosis present

## 2015-06-14 DIAGNOSIS — E1122 Type 2 diabetes mellitus with diabetic chronic kidney disease: Secondary | ICD-10-CM | POA: Diagnosis present

## 2015-06-14 DIAGNOSIS — I69322 Dysarthria following cerebral infarction: Secondary | ICD-10-CM

## 2015-06-14 DIAGNOSIS — I714 Abdominal aortic aneurysm, without rupture: Secondary | ICD-10-CM | POA: Diagnosis present

## 2015-06-14 DIAGNOSIS — I5022 Chronic systolic (congestive) heart failure: Secondary | ICD-10-CM | POA: Diagnosis present

## 2015-06-14 DIAGNOSIS — G894 Chronic pain syndrome: Secondary | ICD-10-CM | POA: Diagnosis present

## 2015-06-14 DIAGNOSIS — K5900 Constipation, unspecified: Secondary | ICD-10-CM | POA: Diagnosis present

## 2015-06-14 DIAGNOSIS — Z87891 Personal history of nicotine dependence: Secondary | ICD-10-CM

## 2015-06-14 DIAGNOSIS — I639 Cerebral infarction, unspecified: Secondary | ICD-10-CM | POA: Diagnosis not present

## 2015-06-14 DIAGNOSIS — M1990 Unspecified osteoarthritis, unspecified site: Secondary | ICD-10-CM | POA: Diagnosis present

## 2015-06-14 DIAGNOSIS — I951 Orthostatic hypotension: Secondary | ICD-10-CM | POA: Diagnosis not present

## 2015-06-14 DIAGNOSIS — I255 Ischemic cardiomyopathy: Secondary | ICD-10-CM

## 2015-06-14 DIAGNOSIS — E041 Nontoxic single thyroid nodule: Secondary | ICD-10-CM | POA: Diagnosis present

## 2015-06-14 DIAGNOSIS — Z7902 Long term (current) use of antithrombotics/antiplatelets: Secondary | ICD-10-CM

## 2015-06-14 DIAGNOSIS — Z79891 Long term (current) use of opiate analgesic: Secondary | ICD-10-CM

## 2015-06-14 DIAGNOSIS — I63232 Cerebral infarction due to unspecified occlusion or stenosis of left carotid arteries: Principal | ICD-10-CM | POA: Diagnosis present

## 2015-06-14 DIAGNOSIS — R1310 Dysphagia, unspecified: Secondary | ICD-10-CM | POA: Diagnosis present

## 2015-06-14 DIAGNOSIS — Z7984 Long term (current) use of oral hypoglycemic drugs: Secondary | ICD-10-CM

## 2015-06-14 DIAGNOSIS — I6521 Occlusion and stenosis of right carotid artery: Secondary | ICD-10-CM | POA: Diagnosis present

## 2015-06-14 DIAGNOSIS — E118 Type 2 diabetes mellitus with unspecified complications: Secondary | ICD-10-CM | POA: Diagnosis present

## 2015-06-14 DIAGNOSIS — I69320 Aphasia following cerebral infarction: Secondary | ICD-10-CM

## 2015-06-14 DIAGNOSIS — E785 Hyperlipidemia, unspecified: Secondary | ICD-10-CM | POA: Diagnosis present

## 2015-06-14 DIAGNOSIS — N183 Chronic kidney disease, stage 3 (moderate): Secondary | ICD-10-CM | POA: Diagnosis present

## 2015-06-14 DIAGNOSIS — I452 Bifascicular block: Secondary | ICD-10-CM | POA: Diagnosis not present

## 2015-06-14 DIAGNOSIS — R339 Retention of urine, unspecified: Secondary | ICD-10-CM | POA: Diagnosis not present

## 2015-06-14 DIAGNOSIS — Z95818 Presence of other cardiac implants and grafts: Secondary | ICD-10-CM

## 2015-06-14 DIAGNOSIS — R262 Difficulty in walking, not elsewhere classified: Secondary | ICD-10-CM | POA: Diagnosis present

## 2015-06-14 DIAGNOSIS — E1151 Type 2 diabetes mellitus with diabetic peripheral angiopathy without gangrene: Secondary | ICD-10-CM | POA: Diagnosis present

## 2015-06-14 LAB — COMPREHENSIVE METABOLIC PANEL
ALBUMIN: 3.2 g/dL — AB (ref 3.5–5.0)
ALT: 24 U/L (ref 17–63)
AST: 25 U/L (ref 15–41)
Alkaline Phosphatase: 54 U/L (ref 38–126)
Anion gap: 9 (ref 5–15)
BUN: 15 mg/dL (ref 6–20)
CHLORIDE: 102 mmol/L (ref 101–111)
CO2: 24 mmol/L (ref 22–32)
Calcium: 9.3 mg/dL (ref 8.9–10.3)
Creatinine, Ser: 1.22 mg/dL (ref 0.61–1.24)
GFR calc Af Amer: >60 mL/min (ref >60)
GFR, EST NON AFRICAN AMERICAN: 57 mL/min — AB (ref >60)
Glucose, Bld: 211 mg/dL — ABNORMAL HIGH (ref 65–99)
POTASSIUM: 4.3 mmol/L (ref 3.5–5.1)
Sodium: 135 mmol/L (ref 135–145)
Total Bilirubin: 0.5 mg/dL (ref 0.3–1.2)
Total Protein: 7 g/dL (ref 6.5–8.1)

## 2015-06-14 LAB — DIFFERENTIAL
Basophils Absolute: 0 10*3/uL (ref 0.0–0.1)
Basophils Relative: 0 %
EOS PCT: 4 %
Eosinophils Absolute: 0.4 10*3/uL (ref 0.0–0.7)
LYMPHS ABS: 1.8 10*3/uL (ref 0.7–4.0)
LYMPHS PCT: 17 %
MONO ABS: 1 10*3/uL (ref 0.1–1.0)
Monocytes Relative: 9 %
Neutro Abs: 7.2 10*3/uL (ref 1.7–7.7)
Neutrophils Relative %: 70 %

## 2015-06-14 LAB — I-STAT CHEM 8, ED
BUN: 18 mg/dL (ref 6–20)
CHLORIDE: 101 mmol/L (ref 101–111)
Calcium, Ion: 1.2 mmol/L (ref 1.13–1.30)
Creatinine, Ser: 1.2 mg/dL (ref 0.61–1.24)
Glucose, Bld: 214 mg/dL — ABNORMAL HIGH (ref 65–99)
HEMATOCRIT: 47 % (ref 39.0–52.0)
HEMOGLOBIN: 16 g/dL (ref 13.0–17.0)
POTASSIUM: 4.2 mmol/L (ref 3.5–5.1)
Sodium: 137 mmol/L (ref 135–145)
TCO2: 23 mmol/L (ref 0–100)

## 2015-06-14 LAB — I-STAT TROPONIN, ED: TROPONIN I, POC: 0.01 ng/mL (ref 0.00–0.08)

## 2015-06-14 LAB — APTT: aPTT: 29 seconds (ref 24–37)

## 2015-06-14 LAB — GLUCOSE, CAPILLARY: GLUCOSE-CAPILLARY: 201 mg/dL — AB (ref 65–99)

## 2015-06-14 LAB — CBC
HCT: 44.3 % (ref 39.0–52.0)
HEMOGLOBIN: 15.4 g/dL (ref 13.0–17.0)
MCH: 31.6 pg (ref 26.0–34.0)
MCHC: 34.8 g/dL (ref 30.0–36.0)
MCV: 91 fL (ref 78.0–100.0)
Platelets: 212 10*3/uL (ref 150–400)
RBC: 4.87 MIL/uL (ref 4.22–5.81)
RDW: 13 % (ref 11.5–15.5)
WBC: 10.4 10*3/uL (ref 4.0–10.5)

## 2015-06-14 LAB — PROTIME-INR
INR: 1.02 (ref 0.00–1.49)
Prothrombin Time: 13.6 seconds (ref 11.6–15.2)

## 2015-06-14 MED ORDER — TRAMADOL-ACETAMINOPHEN 37.5-325 MG PO TABS
1.0000 | ORAL_TABLET | Freq: Three times a day (TID) | ORAL | Status: DC | PRN
  Administered 2015-06-14 – 2015-06-15 (×2): 1 via ORAL
  Administered 2015-06-16: 2 via ORAL
  Filled 2015-06-14: qty 2
  Filled 2015-06-14 (×2): qty 1
  Filled 2015-06-14: qty 2

## 2015-06-14 MED ORDER — ALPRAZOLAM 0.25 MG PO TABS
0.2500 mg | ORAL_TABLET | Freq: Two times a day (BID) | ORAL | Status: DC | PRN
  Administered 2015-06-15: 0.25 mg via ORAL
  Filled 2015-06-14: qty 1

## 2015-06-14 MED ORDER — STROKE: EARLY STAGES OF RECOVERY BOOK
Freq: Once | Status: AC
  Administered 2015-06-14: 23:00:00

## 2015-06-14 MED ORDER — ACETAMINOPHEN 325 MG PO TABS
650.0000 mg | ORAL_TABLET | ORAL | Status: DC | PRN
  Administered 2015-06-15 (×2): 650 mg via ORAL
  Filled 2015-06-14 (×3): qty 2

## 2015-06-14 MED ORDER — CLOPIDOGREL BISULFATE 75 MG PO TABS
75.0000 mg | ORAL_TABLET | Freq: Every day | ORAL | Status: DC
  Administered 2015-06-15 – 2015-06-19 (×4): 75 mg via ORAL
  Filled 2015-06-14 (×4): qty 1

## 2015-06-14 MED ORDER — ACETAMINOPHEN 650 MG RE SUPP: 650.0000 mg | RECTAL | Status: DC | PRN

## 2015-06-14 MED ORDER — INSULIN ASPART 100 UNIT/ML ~~LOC~~ SOLN
0.0000 [IU] | Freq: Every day | SUBCUTANEOUS | Status: DC
  Administered 2015-06-14: 2 [IU] via SUBCUTANEOUS
  Administered 2015-06-15: 3 [IU] via SUBCUTANEOUS
  Administered 2015-06-16 – 2015-06-19 (×2): 2 [IU] via SUBCUTANEOUS

## 2015-06-14 MED ORDER — ENOXAPARIN SODIUM 40 MG/0.4ML ~~LOC~~ SOLN
40.0000 mg | SUBCUTANEOUS | Status: DC
  Administered 2015-06-15 – 2015-06-19 (×4): 40 mg via SUBCUTANEOUS
  Filled 2015-06-14 (×5): qty 0.4

## 2015-06-14 MED ORDER — TRAMADOL-ACETAMINOPHEN 37.5-325 MG PO TABS: 1.0000 | ORAL_TABLET | Freq: Three times a day (TID) | ORAL | Status: DC | PRN

## 2015-06-14 MED ORDER — SIMVASTATIN 40 MG PO TABS
40.0000 mg | ORAL_TABLET | Freq: Every day | ORAL | Status: DC
  Administered 2015-06-14 – 2015-06-19 (×5): 40 mg via ORAL
  Filled 2015-06-14 (×6): qty 1

## 2015-06-14 MED ORDER — MECLIZINE HCL 25 MG PO TABS
25.0000 mg | ORAL_TABLET | Freq: Once | ORAL | Status: AC
  Administered 2015-06-14: 25 mg via ORAL
  Filled 2015-06-14: qty 1

## 2015-06-14 MED ORDER — ASPIRIN 325 MG PO TABS
325.0000 mg | ORAL_TABLET | Freq: Every day | ORAL | Status: DC
  Administered 2015-06-15 – 2015-06-18 (×3): 325 mg via ORAL
  Filled 2015-06-14 (×3): qty 1

## 2015-06-14 MED ORDER — INSULIN ASPART 100 UNIT/ML ~~LOC~~ SOLN
0.0000 [IU] | Freq: Three times a day (TID) | SUBCUTANEOUS | Status: DC
  Administered 2015-06-15: 5 [IU] via SUBCUTANEOUS
  Administered 2015-06-15 – 2015-06-16 (×2): 3 [IU] via SUBCUTANEOUS
  Administered 2015-06-16 (×2): 5 [IU] via SUBCUTANEOUS
  Administered 2015-06-17: 8 [IU] via SUBCUTANEOUS
  Administered 2015-06-18: 5 [IU] via SUBCUTANEOUS
  Administered 2015-06-18: 2 [IU] via SUBCUTANEOUS
  Administered 2015-06-18: 3 [IU] via SUBCUTANEOUS
  Administered 2015-06-19: 5 [IU] via SUBCUTANEOUS
  Administered 2015-06-19: 3 [IU] via SUBCUTANEOUS

## 2015-06-14 NOTE — H&P (Signed)
[facility] History and Physical  Patient: Lucas Morgan  MRN: [id number]  DOB: December 01, 1940  DOS: the patient was seen and examined on 06/14/2015 PCP: Abran Richard, PA-C  Referring physician: Dr. Criss Alvine Chief Complaint: Difficulty walking  HPI: Lucas Morgan is a 74 y.o. male with Past medical history of recent CVA, coronary artery disease, AAA, diabetes mellitus, dyslipidemia, chronic kidney disease. The patient is presenting with complaints of difficulty ambulating. The patient was recently hospitalized for a CVA and had developed expressive aphasia. He was discharged home on September 27. At the time of the discharge he required supervision for basic and family access to ask and continues to have some aphasia as well as dysarthria. After going home he started having difficulty ambulating and with dizziness and lightheadedness and therefore decided to come to the hospital. No fall no trauma no injury. No fever no chills no nausea no vomiting no diarrhea reported. His symptoms started 2-3 days ago and progressively worsening. He also had some spinning sensation on turning his head.  The patient is coming from home  At his baseline ambulates with support And is dependent for most of his ADL; does not manages his medication on his own.  Review of Systems: as mentioned in the history of present illness.  A comprehensive review of the other systems is negative.  Past Medical History  Diagnosis Date  . Diabetes mellitus   . Dyslipidemia   . MI (myocardial infarction) (HCC) 11/09/2008    2.5 x 23 Xience V DES to the CFX  . AAA (abdominal aortic aneurysm) (HCC)     a. Abd U/S 7/14: mild aneurysmal dilatation 3x3 cm; cholelithiasis without evid of cholecystitis => repeat 1 year  . Coronary artery disease   . Arthritis     stenosis, lumbar region  . Renal insufficiency   . Stroke [facility])    Past Surgical History  Procedure Laterality Date  . Knee surgery    . Coronary stent  placement    . Back surgery  2015    lumbar fusion  . Joint replacement Left   . 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 Lumbar laminectomy/decompression microdiscectomy N/A 10/18/2014    Procedure: LUMBAR LAMINECTOMY/DECOMPRESSION MICRODISCECTOMYLUMBAR THREE-FOUR ;  Surgeon: Temple Pacini, MD;  Location: [facility] NEURO ORS;  Service: Neurosurgery;  Laterality: N/A;  . Coronary angioplasty    . Ep implantable device N/A 05/26/2015    Procedure: Loop Recorder Insertion;  Surgeon: Marinus Maw, MD;  Location: [facility] INVASIVE CV LAB;  Service: Cardiovascular;  Laterality: N/A;  . Tee without cardioversion N/A 05/26/2015    Procedure: TRANSESOPHAGEAL ECHOCARDIOGRAM (TEE);  Surgeon: Vesta Mixer, MD;  Location: [facility] ENDOSCOPY;  Service: Cardiovascular;  Laterality: N/A;   Social History:  reports that he quit smoking about 21 years ago. He has never used smokeless tobacco. He reports that he does not drink alcohol or use illicit drugs.  Allergies  Allergen Reactions  . Trazodone And Nefazodone Other (See Comments)    High blood sugar    Family History  Problem Relation Age of Onset  . Heart attack Brother     x2 brothers  . CAD Brother     Prior to Admission medications   Medication Sig Start Date End Date Taking? Authorizing Provider  ALPRAZolam Prudy Feeler) 0.5 MG tablet Take 0.5 mg by mouth 2 (two) times daily.  02/21/15  Yes Historical Provider, MD  aspirin  325 MG tablet Take 1 tablet (325 mg total) by mouth daily. 05/27/15  Yes Shanker Levora Dredge, MD  clopidogrel (PLAVIX) 75 MG tablet Take 1 tablet (75 mg total) by mouth daily. 06/11/15  Yes Evlyn Kanner Love, PA-C  glipiZIDE (GLUCOTROL) 10 MG tablet Take 0.5 tablets (5 mg total) by mouth daily before breakfast. 06/11/15  Yes Evlyn Kanner Love, PA-C  HYDROcodone-acetaminophen (NORCO) 10-325 MG tablet Take 1 tablet by mouth every 6 (six) hours as needed (pain). 06/11/15 01/17/16 Yes Pamela S Love, PA-C  LANTUS SOLOSTAR 100 UNIT/ML  Solostar Pen Inject 37 Units into the skin at bedtime. 06/11/15  Yes Evlyn Kanner Love, PA-C  Menthol-Methyl Salicylate (MUSCLE RUB) 10-15 % CREA Apply 1 application topically 2 (two) times daily as needed for muscle pain. 06/11/15  Yes Evlyn Kanner Love, PA-C  mirtazapine (REMERON) 7.5 MG tablet Take 1 tablet (7.5 mg total) by mouth at bedtime. For depression/sleep 06/11/15  Yes Evlyn Kanner Love, PA-C  senna-docusate (SENOKOT-S) 8.6-50 MG tablet Take 1 tablet by mouth 2 (two) times daily. 06/11/15  Yes Evlyn Kanner Love, PA-C  simvastatin (ZOCOR) 40 MG tablet Take 40 mg by mouth at bedtime. 05/08/15  Yes Historical Provider, MD    Physical Exam: Filed Vitals:   06/14/15 2145 06/14/15 2200 06/14/15 2213 06/14/15 2247  BP: 155/77 125/69  141/63  Pulse: 94 91  98  Temp:    98 F (36.7 C)  TempSrc:    Oral  Resp: Height:    (1.753 m)   Weight:   74.571 kg (164 lb 6.4 oz)   SpO2: 95% 96%  94%    General: Alert, Awake and Oriented to Time, Place and Person. 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 Neck: no JVD Cardiovascular: S1 and S2 Present, no Murmur, Peripheral Pulses Present Respiratory: Bilateral Air entry equal and Decreased,  Clear to Auscultation, no Crackles, no wheezes Abdomen: Bowel Sound present, Soft and no tenderness Skin: no Rash Extremities: no Pedal edema, no calf tenderness Neurologic: Mental status AAOx3, speech expressive aphasia as well as dysarthria, attention normal,  Cranial Nerves left eye blindness  Motor strength bilateral equal strength 5/5,  Sensation present to light touch,  Reflexes present knee and biceps, babinski negative,  Cerebellar test normal finger nose finger.  Labs on Admission:  CBC:  Recent Labs Lab 06/14/15 1707 06/14/15 1711  WBC  --  10.4  NEUTROABS  --  7.2  HGB 16.0 15.4  HCT 47.0 44.3  MCV  --  91.0  PLT  --  212    CMP     Component Value Date/Time   NA 135 06/14/2015 1711   K  4.3 06/14/2015 1711   CL 102 06/14/2015 1711   CO2 24 06/14/2015 1711   GLUCOSE 211* 06/14/2015 1711   BUN 15 06/14/2015 1711   CREATININE 1.22 06/14/2015 1711   CALCIUM 9.3 06/14/2015 1711   PROT 7.0 06/14/2015 1711   ALBUMIN 3.2* 06/14/2015 1711   AST 25 06/14/2015 1711   ALT 24 06/14/2015 1711   ALKPHOS 54 06/14/2015 1711   BILITOT 0.5 06/14/2015 1711   GFRNONAA 57* 06/14/2015 1711   GFRAA >60 06/14/2015 1711    No results for input(s): CKTOTAL, CKMB, CKMBINDEX, TROPONINI in the last 168 hours. BNP (last 3 results) No results for input(s): BNP in the last 8760 hours.  ProBNP (last 3 results) No results for input(s): PROBNP in the last  8760 hours.   Radiological Exams on Admission: Ct Head Wo Contrast  06/14/2015   CLINICAL DATA:  Acute dizziness for 1 day. Recent diagnosis of left frontal infarct.  EXAM: CT HEAD WITHOUT CONTRAST  TECHNIQUE: Contiguous axial images were obtained from the base of the skull through the vertex without intravenous contrast.  COMPARISON:  05/24/2015 and prior exams.  FINDINGS: A subacute left frontal infarct is again identified with expected evolutionary changes.  Mild chronic small-vessel white matter ischemic changes, mild generalized cerebral volume loss and remote left parietal infarct again noted.  No acute intracranial abnormalities are identified, including mass lesion or mass effect, hydrocephalus, extra-axial fluid collection, midline shift, hemorrhage, or acute infarction.  The visualized bony calvarium is unremarkable.  Mild deformity and calcification of the left globe again noted.  IMPRESSION: No evidence of acute intracranial abnormality.  Evolutionary changes of known subacute left frontal infarct.  Mild atrophy, chronic small-vessel white matter ischemic changes and remote left parietal infarct.   Electronically Signed   By: Harmon Pier M.D.   On: 06/14/2015 19:11   Mr Shirlee Latch Wo Contrast  06/14/2015   CLINICAL DATA:  74 year old diabetic  male with dyslipidemia and renal insufficiency complaining of dizziness since yesterday. Recent stroke. Subsequent encounter.  EXAM: MRI HEAD WITHOUT CONTRAST  MRA HEAD WITHOUT CONTRAST  TECHNIQUE: Multiplanar, multiecho pulse sequences of the brain and surrounding structures were obtained without intravenous contrast. Angiographic images of the head were obtained using MRA technique without contrast.  COMPARISON:  06/14/2015 head CT.  05/25/2015 MR.  FINDINGS: MRI HEAD FINDINGS  Exam is motion degraded.  Several subacute infarcts demonstrate expected evolution. Largest subacute infarct left frontal lobe (with mild laminar necrosis). Scattered small subacute infarcts noted bilaterally.  New tiny acute left caudate head infarct.  Remote small caudate infarct.  No intracranial hemorrhage.  Moderate small vessel disease type changes.  Global atrophy without hydrocephalus.  No intracranial mass lesion noted on this unenhanced exam.  Left phthisis bulbi.  Cervical medullary junction, pituitary region and pineal region unremarkable.  Polypoid opacification anterior aspect sphenoid sinus.  MRA HEAD FINDINGS  Anterior circulation without medium or large size vessel significant stenosis or occlusion.  Middle cerebral artery branch vessel narrowing and irregularity bilaterally.  Right vertebral artery ends in a posterior inferior cerebellar artery distribution. The right posterior inferior cerebellar artery is narrowed/ irregular and poorly delineated.  No significant stenosis left vertebral artery or basilar artery.  Nonvisualized anterior inferior cerebellar arteries.  Mild irregularity of portions of the posterior cerebral artery bilaterally with narrowing most notable involving distal branches.  No aneurysm noted.  IMPRESSION: MRI HEAD  Exam is motion degraded.  New tiny acute left caudate head infarct.  Several subacute infarcts demonstrate expected evolution. Largest subacute infarct left frontal lobe (with mild laminar  necrosis). Scattered small subacute infarcts noted bilaterally.  Remote small caudate infarct.  No intracranial hemorrhage.  Moderate small vessel disease type changes.  Global atrophy without hydrocephalus.  Polypoid opacification anterior aspect sphenoid sinus.  MRA HEAD FINDINGS  Anterior circulation without medium or large size vessel significant stenosis or occlusion.  Middle cerebral artery branch vessel narrowing and irregularity bilaterally.  Right vertebral artery ends in a posterior inferior cerebellar artery distribution. The right posterior inferior cerebellar artery is narrowed/ irregular and poorly delineated.  No significant stenosis left vertebral artery or basilar artery.  Nonvisualized anterior inferior cerebellar arteries.  Mild irregularity of portions of the posterior cerebral artery bilaterally with narrowing most notable involving distal  branches.   Electronically Signed   By: Lacy Duverney M.D.   On: 06/14/2015 21:04   Mr Brain Wo Contrast  06/14/2015   CLINICAL DATA:  74 year old diabetic male with dyslipidemia and renal insufficiency complaining of dizziness since yesterday. Recent stroke. Subsequent encounter.  EXAM: MRI HEAD WITHOUT CONTRAST  MRA HEAD WITHOUT CONTRAST  TECHNIQUE: Multiplanar, multiecho pulse sequences of the brain and surrounding structures were obtained without intravenous contrast. Angiographic images of the head were obtained using MRA technique without contrast.  COMPARISON:  06/14/2015 head CT.  05/25/2015 MR.  FINDINGS: MRI HEAD FINDINGS  Exam is motion degraded.  Several subacute infarcts demonstrate expected evolution. Largest subacute infarct left frontal lobe (with mild laminar necrosis). Scattered small subacute infarcts noted bilaterally.  New tiny acute left caudate head infarct.  Remote small caudate infarct.  No intracranial hemorrhage.  Moderate small vessel disease type changes.  Global atrophy without hydrocephalus.  No intracranial mass lesion noted  on this unenhanced exam.  Left phthisis bulbi.  Cervical medullary junction, pituitary region and pineal region unremarkable.  Polypoid opacification anterior aspect sphenoid sinus.  MRA HEAD FINDINGS  Anterior circulation without medium or large size vessel significant stenosis or occlusion.  Middle cerebral artery branch vessel narrowing and irregularity bilaterally.  Right vertebral artery ends in a posterior inferior cerebellar artery distribution. The right posterior inferior cerebellar artery is narrowed/ irregular and poorly delineated.  No significant stenosis left vertebral artery or basilar artery.  Nonvisualized anterior inferior cerebellar arteries.  Mild irregularity of portions of the posterior cerebral artery bilaterally with narrowing most notable involving distal branches.  No aneurysm noted.  IMPRESSION: MRI HEAD  Exam is motion degraded.  New tiny acute left caudate head infarct.  Several subacute infarcts demonstrate expected evolution. Largest subacute infarct left frontal lobe (with mild laminar necrosis). Scattered small subacute infarcts noted bilaterally.  Remote small caudate infarct.  No intracranial hemorrhage.  Moderate small vessel disease type changes.  Global atrophy without hydrocephalus.  Polypoid opacification anterior aspect sphenoid sinus.  MRA HEAD FINDINGS  Anterior circulation without medium or large size vessel significant stenosis or occlusion.  Middle cerebral artery branch vessel narrowing and irregularity bilaterally.  Right vertebral artery ends in a posterior inferior cerebellar artery distribution. The right posterior inferior cerebellar artery is narrowed/ irregular and poorly delineated.  No significant stenosis left vertebral artery or basilar artery.  Nonvisualized anterior inferior cerebellar arteries.  Mild irregularity of portions of the posterior cerebral artery bilaterally with narrowing most notable involving distal branches.   Electronically Signed   By:  Lacy Duverney M.D.   On: 06/14/2015 21:04   EKG: Independently reviewed. normal sinus rhythm, nonspecific ST and T waves changes.  Assessment/Plan 1. CVA (cerebral infarction) Patient presents with complaints of difficulty ambulating. An MRI of the brain showing acute left caudate infarct. As per neurology although this would not explain his presentation require significant therapy. Patient will be admitted to the hospital for PTOT and speech consultation. Patient had recent stroke workup. Recommendation from neurology is to continue aspirin and Plavix. Patient to monitor on telemetry.  2.  CAD, NATIVE VESSEL Continue aspirin and Plavix. 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  4  Carotid stenosis Recent workup for carotid stenosis. We'll continue aspirin and Plavix.  5  Chronic pain syndrome Continuing with tramadol.  6  Dyslipidemia Continue with statins.  Nutrition: Nothing by mouth pending stroke  evaluation DVT Prophylaxis: subcutaneous Heparin  Advance goals of care discussion: Full code   Consults: Neurology  Disposition: Admitted as observation, telemetry unit.  Author: Lynden Oxford, MD [facility] Pager: [phone] 06/14/2015  If 7PM-7AM, please contact night-coverage [email or web address] Password [email or web address]

## 2015-06-14 NOTE — ED Notes (Signed)
Dr. Patel, hospitalist, at the bedside.  

## 2015-06-14 NOTE — ED Notes (Signed)
Patient still off unit for testing.

## 2015-06-14 NOTE — Progress Notes (Addendum)
Pt arrived to room 5M06 from ED. Pt is alert and oriented and unsteady when ambulating.  Tele applied and safety measures in place.  Pt daughter, Deloris, notified about what room the pt is in.  Will continue to monitor.   Estanislado Emms, RN

## 2015-06-14 NOTE — ED Notes (Signed)
Pt taken back to triage. 

## 2015-06-14 NOTE — ED Notes (Signed)
Dr. Criss Alvine at bedside updating pt

## 2015-06-14 NOTE — ED Notes (Signed)
Family reports that pt has been complaining of dizziness onset yesterday evening. Family reports that pt has been bumping into walls when ambulating. Pt was admitted to hospital for stroke and discharged home Friday.

## 2015-06-14 NOTE — Consult Note (Signed)
Stroke Consult    Chief Complaint: dizziness HPI: Lucas Morgan is an 74 y.o. male hx of DM, HLD, CAD, recent CVA (discharged 9/28) presenting with acute onset of dizziness and difficulty walking. Reports symptoms started 2-3 days ago and has been constant since then. Notes getting a spinning sensation when he turns his head from side to side. Also notes some blurred vision. Family reports when he was discharged from rehab he was able to walk with a walker, now currently unable to walk due to gait instability.   Has residual right-sided weakness and speech deficits from recent CVA. MRI brain imaging reviewed. Shows new acute left caudate infarct, several subacute infarcts bilaterally. Right vertebral artery ends in PICA distribution. PICA is narrowed/irregular and poorly delineated. Carotid doppler from 9/13 shows 1-39% R ICA stenosis adn 60-79% left ICA stenosis. TEE was unremarkable.   Patients dizziness improved after receiving dose of meclizine in the ED>   Date last known well: 06/11/2015 Time last known well: unclear tPA Given: no, outside tPA window Modified Rankin: Rankin Score=3  Past Medical History  Diagnosis Date  . Diabetes mellitus   . Dyslipidemia   . MI (myocardial infarction) (HCC) 11/09/2008    2.5 x 23 Xience V DES to the CFX  . AAA (abdominal aortic aneurysm) (HCC)     a. Abd U/S 7/14: mild aneurysmal dilatation 3x3 cm; cholelithiasis without evid of cholecystitis => repeat 1 year  . Coronary artery disease   . Arthritis     stenosis, lumbar region  . Renal insufficiency   . Stroke [facility])     Past Surgical History  Procedure Laterality Date  . Knee surgery    . Coronary stent placement    . Back surgery  2015    lumbar fusion  . Joint replacement Left   . 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 Lumbar laminectomy/decompression microdiscectomy N/A 10/18/2014    Procedure: LUMBAR LAMINECTOMY/DECOMPRESSION MICRODISCECTOMYLUMBAR  THREE-FOUR ;  Surgeon: Temple Pacini, MD;  Location: [facility] NEURO ORS;  Service: Neurosurgery;  Laterality: N/A;  . Coronary angioplasty    . Ep implantable device N/A 05/26/2015    Procedure: Loop Recorder Insertion;  Surgeon: Marinus Maw, MD;  Location: [facility] INVASIVE CV LAB;  Service: Cardiovascular;  Laterality: N/A;  . Tee without cardioversion N/A 05/26/2015    Procedure: TRANSESOPHAGEAL ECHOCARDIOGRAM (TEE);  Surgeon: Vesta Mixer, MD;  Location: [facility] ENDOSCOPY;  Service: Cardiovascular;  Laterality: N/A;    Family History  Problem Relation Age of Onset  . Heart attack Brother     x2 brothers  . CAD Brother    Social History:  reports that he quit smoking about 21 years ago. He has never used smokeless tobacco. He reports that he does not drink alcohol or use illicit drugs.  Allergies:  Allergies  Allergen Reactions  . Trazodone And Nefazodone Other (See Comments)    High blood sugar     (Not in a hospital admission)  ROS: Out of a complete 14 system review, the patient complains of only the following symptoms, and all other reviewed systems are negative. +dizziness   Physical Examination: Filed Vitals:   06/14/15 2100  BP: 141/78  Pulse: 94  Temp:   Resp: 24   Physical Exam  Constitutional: He appears well-developed and well-nourished.  Psych: Affect appropriate to situation Eyes: No scleral injection HENT: No OP obstrucion Head: Normocephalic.  Cardiovascular: Normal rate and  regular rhythm.  Respiratory: Effort normal and breath sounds normal.  GI: Soft. Bowel sounds are normal. No distension. There is no tenderness.  Skin: WDI  Neurologic Examination: Mental Status: Alert, oriented, emotional lability (crying).  Expressive aphasia. Moderate dysarthria. Able to follow simple commands.   Cranial Nerves: II: optic discs not visualized. 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 Right pupil briskly reactive to light III,IV, VI: ptosis not  present, extra-ocular motions intact bilaterally. Noted horizontal nystagmus with lateral gaze V,VII: slight flattening of right NLF, facial light touch sensation normal bilaterally VIII: hearing normal bilaterally IX,X: gag reflex present XI: trapezius strength/neck flexion strength normal bilaterally XII: tongue strength normal  Motor: Right UE proximal 4+/5 to distal 5-/5 LUE 5/5 RLE 5-/5 to 5/5 LLE 5/5 Tone and bulk:normal tone throughout; no atrophy noted Sensory: Pinprick and light touch intact throughout, bilaterally Deep Tendon Reflexes: 2+ and symmetric throughout Plantars: Right: downgoing   Left: downgoing Cerebellar: Dysmetria with FTN on the right Gait: deferred  Laboratory Studies:   Basic Metabolic Panel:  Recent Labs Lab 06/10/15 0535 06/10/15 1200 06/14/15 1707 06/14/15 1711  NA  --  137 137 135  K  --  4.2 4.2 4.3  CL  --  104 101 102  CO2  --  25  --  24  GLUCOSE  --  214* 214* 211*  BUN  --  21* 18 15  CREATININE 1.30* 1.32* 1.20 1.22  CALCIUM  --  8.8*  --  9.3    Liver Function Tests:  Recent Labs Lab 06/14/15 1711  AST 25  ALT 24  ALKPHOS 54  BILITOT 0.5  PROT 7.0  ALBUMIN 3.2*   No results for input(s): LIPASE, AMYLASE in the last 168 hours. No results for input(s): AMMONIA in the last 168 hours.  CBC:  Recent Labs Lab 06/14/15 1707 06/14/15 1711  WBC  --  10.4  NEUTROABS  --  7.2  HGB 16.0 15.4  HCT 47.0 44.3  MCV  --  91.0  PLT  --  212    Cardiac Enzymes: No results for input(s): CKTOTAL, CKMB, CKMBINDEX, TROPONINI in the last 168 hours.  BNP: Invalid input(s): POCBNP  CBG:  Recent Labs Lab 06/10/15 0715 06/10/15 1105 06/10/15 1601 06/10/15 2031 06/11/15 0639  GLUCAP 94 197* 166* 207* 138*    Microbiology: Results for orders placed or performed during the hospital encounter of 10/18/14  Surgical pcr screen     Status: None   Collection Time: 10/18/14  7:20 AM  Result Value Ref Range Status   MRSA,  PCR NEGATIVE NEGATIVE Final   Staphylococcus aureus NEGATIVE NEGATIVE Final    Comment:        The Xpert SA Assay (FDA approved for NASAL specimens in patients over 21 years of age), is one component of a comprehensive surveillance program.  Test performance has been validated by [facility] for patients greater than or equal to 25 year old. It is not intended to diagnose infection nor to guide or monitor treatment.     Coagulation Studies:  Recent Labs  06/14/15 1711  LABPROT 13.6  INR 1.02    Urinalysis: No results for input(s): COLORURINE, LABSPEC, PHURINE, GLUCOSEU, HGBUR, BILIRUBINUR, KETONESUR, PROTEINUR, UROBILINOGEN, NITRITE, LEUKOCYTESUR in the last 168 hours.  Invalid input(s): APPERANCEUR  Lipid Panel:     Component Value Date/Time   CHOL 119 05/25/2015 0705   TRIG 129 05/25/2015 0705   HDL 33* 05/25/2015 0705   CHOLHDL 3.6  05/25/2015 0705   VLDL 26 05/25/2015 0705   LDLCALC 60 05/25/2015 0705    HgbA1C:  Lab Results  Component Value Date   HGBA1C 7.5* 05/25/2015    Urine Drug Screen:  No results found for: LABOPIA, COCAINSCRNUR, LABBENZ, AMPHETMU, THCU, LABBARB  Alcohol Level: No results for input(s): ETH in the last 168 hours.  Other results:  Imaging: Ct Head Wo Contrast  06/14/2015   CLINICAL DATA:  Acute dizziness for 1 day. Recent diagnosis of left frontal infarct.  EXAM: CT HEAD WITHOUT CONTRAST  TECHNIQUE: Contiguous axial images were obtained from the base of the skull through the vertex without intravenous contrast.  COMPARISON:  05/24/2015 and prior exams.  FINDINGS: A subacute left frontal infarct is again identified with expected evolutionary changes.  Mild chronic small-vessel white matter ischemic changes, mild generalized cerebral volume loss and remote left parietal infarct again noted.  No acute intracranial abnormalities are identified, including mass lesion or mass effect, hydrocephalus, extra-axial fluid collection, midline shift,  hemorrhage, or acute infarction.  The visualized bony calvarium is unremarkable.  Mild deformity and calcification of the left globe again noted.  IMPRESSION: No evidence of acute intracranial abnormality.  Evolutionary changes of known subacute left frontal infarct.  Mild atrophy, chronic small-vessel white matter ischemic changes and remote left parietal infarct.   Electronically Signed   By: Harmon Pier M.D.   On: 06/14/2015 19:11   Mr Shirlee Latch Wo Contrast  06/14/2015   CLINICAL DATA:  74 year old diabetic male with dyslipidemia and renal insufficiency complaining of dizziness since yesterday. Recent stroke. Subsequent encounter.  EXAM: MRI HEAD WITHOUT CONTRAST  MRA HEAD WITHOUT CONTRAST  TECHNIQUE: Multiplanar, multiecho pulse sequences of the brain and surrounding structures were obtained without intravenous contrast. Angiographic images of the head were obtained using MRA technique without contrast.  COMPARISON:  06/14/2015 head CT.  05/25/2015 MR.  FINDINGS: MRI HEAD FINDINGS  Exam is motion degraded.  Several subacute infarcts demonstrate expected evolution. Largest subacute infarct left frontal lobe (with mild laminar necrosis). Scattered small subacute infarcts noted bilaterally.  New tiny acute left caudate head infarct.  Remote small caudate infarct.  No intracranial hemorrhage.  Moderate small vessel disease type changes.  Global atrophy without hydrocephalus.  No intracranial mass lesion noted on this unenhanced exam.  Left phthisis bulbi.  Cervical medullary junction, pituitary region and pineal region unremarkable.  Polypoid opacification anterior aspect sphenoid sinus.  MRA HEAD FINDINGS  Anterior circulation without medium or large size vessel significant stenosis or occlusion.  Middle cerebral artery branch vessel narrowing and irregularity bilaterally.  Right vertebral artery ends in a posterior inferior cerebellar artery distribution. The right posterior inferior cerebellar artery is  narrowed/ irregular and poorly delineated.  No significant stenosis left vertebral artery or basilar artery.  Nonvisualized anterior inferior cerebellar arteries.  Mild irregularity of portions of the posterior cerebral artery bilaterally with narrowing most notable involving distal branches.  No aneurysm noted.  IMPRESSION: MRI HEAD  Exam is motion degraded.  New tiny acute left caudate head infarct.  Several subacute infarcts demonstrate expected evolution. Largest subacute infarct left frontal lobe (with mild laminar necrosis). Scattered small subacute infarcts noted bilaterally.  Remote small caudate infarct.  No intracranial hemorrhage.  Moderate small vessel disease type changes.  Global atrophy without hydrocephalus.  Polypoid opacification anterior aspect sphenoid sinus.  MRA HEAD FINDINGS  Anterior circulation without medium or large size vessel significant stenosis or occlusion.  Middle cerebral artery branch vessel narrowing and irregularity bilaterally.  Right vertebral artery ends in a posterior inferior cerebellar artery distribution. The right posterior inferior cerebellar artery is narrowed/ irregular and poorly delineated.  No significant stenosis left vertebral artery or basilar artery.  Nonvisualized anterior inferior cerebellar arteries.  Mild irregularity of portions of the posterior cerebral artery bilaterally with narrowing most notable involving distal branches.   Electronically Signed   By: Lacy Duverney M.D.   On: 06/14/2015 21:04   Mr Brain Wo Contrast  06/14/2015   CLINICAL DATA:  74 year old diabetic male with dyslipidemia and renal insufficiency complaining of dizziness since yesterday. Recent stroke. Subsequent encounter.  EXAM: MRI HEAD WITHOUT CONTRAST  MRA HEAD WITHOUT CONTRAST  TECHNIQUE: Multiplanar, multiecho pulse sequences of the brain and surrounding structures were obtained without intravenous contrast. Angiographic images of the head were obtained using MRA technique  without contrast.  COMPARISON:  06/14/2015 head CT.  05/25/2015 MR.  FINDINGS: MRI HEAD FINDINGS  Exam is motion degraded.  Several subacute infarcts demonstrate expected evolution. Largest subacute infarct left frontal lobe (with mild laminar necrosis). Scattered small subacute infarcts noted bilaterally.  New tiny acute left caudate head infarct.  Remote small caudate infarct.  No intracranial hemorrhage.  Moderate small vessel disease type changes.  Global atrophy without hydrocephalus.  No intracranial mass lesion noted on this unenhanced exam.  Left phthisis bulbi.  Cervical medullary junction, pituitary region and pineal region unremarkable.  Polypoid opacification anterior aspect sphenoid sinus.  MRA HEAD FINDINGS  Anterior circulation without medium or large size vessel significant stenosis or occlusion.  Middle cerebral artery branch vessel narrowing and irregularity bilaterally.  Right vertebral artery ends in a posterior inferior cerebellar artery distribution. The right posterior inferior cerebellar artery is narrowed/ irregular and poorly delineated.  No significant stenosis left vertebral artery or basilar artery.  Nonvisualized anterior inferior cerebellar arteries.  Mild irregularity of portions of the posterior cerebral artery bilaterally with narrowing most notable involving distal branches.  No aneurysm noted.  IMPRESSION: MRI HEAD  Exam is motion degraded.  New tiny acute left caudate head infarct.  Several subacute infarcts demonstrate expected evolution. Largest subacute infarct left frontal lobe (with mild laminar necrosis). Scattered small subacute infarcts noted bilaterally.  Remote small caudate infarct.  No intracranial hemorrhage.  Moderate small vessel disease type changes.  Global atrophy without hydrocephalus.  Polypoid opacification anterior aspect sphenoid sinus.  MRA HEAD FINDINGS  Anterior circulation without medium or large size vessel significant stenosis or occlusion.  Middle  cerebral artery branch vessel narrowing and irregularity bilaterally.  Right vertebral artery ends in a posterior inferior cerebellar artery distribution. The right posterior inferior cerebellar artery is narrowed/ irregular and poorly delineated.  No significant stenosis left vertebral artery or basilar artery.  Nonvisualized anterior inferior cerebellar arteries.  Mild irregularity of portions of the posterior cerebral artery bilaterally with narrowing most notable involving distal branches.   Electronically Signed   By: Lacy Duverney M.D.   On: 06/14/2015 21:04    Assessment: 74 y.o. male hx of DM, HLD, CAD, recent CVA presenting with acute onset of vertigo and gait instability. MRI imaging shows acute left caudate infarct. Location of acute infarct would not explain his presentation. Suspect peripheral vestibular dysfunction vs possible MRI negative cerebellar infarct.   Plan: 1. HgbA1c of 7.5, Lipid panel shows LDL 60 2. MRI, MRA  of the brain without contrast completed 3. PT consult, OT consult, Speech consult - may benefit from vestibular rehab 4. TEE completed at prior admission 5. Carotid dopplers completed at  prior admission 6. Prophylactic therapy-continue ASA and Plavix 7. Risk factor modification 8. Telemetry monitoring 9. Frequent neuro checks 10. NPO until RN stroke swallow screen    Elspeth Cho, DO [facility] [phone]  If 7pm- 7am, please page neurology on call as listed in AMION. 06/14/2015, 9:46 PM

## 2015-06-14 NOTE — ED Provider Notes (Signed)
CSN: [id number]     Arrival date & time 06/14/15  1611 History   First MD Initiated Contact with Patient 06/14/15 1722     Chief Complaint  Patient presents with  . Dizziness  . Gait Problem     (Consider location/radiation/quality/duration/timing/severity/associated sxs/prior Treatment) HPI  74 year old male with a recent stroke and discharged from the hospital on 9/28 presents with dizziness and trouble walking. He was able to annually before he left the hospital but since getting home he has been unable to walk straight. He had walk with a walker and was barely able to do that. Anytime he moves his head he gets a room spinning sensation. He feels like there is blurry vision as well. No headaches. No nausea, vomiting. Patient states this is been present for the last 2-3 days. Has residual right-sided deficits from the stroke but was previously able to ambulate in the hospital. No fevers. No tendinitis.  Past Medical History  Diagnosis Date  . Diabetes mellitus   . Dyslipidemia   . MI (myocardial infarction) (HCC) 11/09/2008    2.5 x 23 Xience V DES to the CFX  . AAA (abdominal aortic aneurysm) (HCC)     a. Abd U/S 7/14: mild aneurysmal dilatation 3x3 cm; cholelithiasis without evid of cholecystitis => repeat 1 year  . Coronary artery disease   . Arthritis     stenosis, lumbar region  . Renal insufficiency   . Stroke [facility])    Past Surgical History  Procedure Laterality Date  . Knee surgery    . Coronary stent placement    . Back surgery  2015    lumbar fusion  . Joint replacement Left   . 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 Lumbar laminectomy/decompression microdiscectomy N/A 10/18/2014    Procedure: LUMBAR LAMINECTOMY/DECOMPRESSION MICRODISCECTOMYLUMBAR THREE-FOUR ;  Surgeon: Temple Pacini, MD;  Location: [facility] NEURO ORS;  Service: Neurosurgery;  Laterality: N/A;  . Coronary angioplasty    . Ep implantable device N/A 05/26/2015    Procedure: Loop  Recorder Insertion;  Surgeon: Marinus Maw, MD;  Location[facility] INVASIVE CV LAB;  Service: Cardiovascular;  Laterality: N/A;  . Tee without cardioversion N/A 05/26/2015    Procedure: TRANSESOPHAGEAL ECHOCARDIOGRAM (TEE);  Surgeon: Vesta Mixer, MD;  Location: [facility] ENDOSCOPY;  Service: Cardiovascular;  Laterality: N/A;   Family History  Problem Relation Age of Onset  . Heart attack Brother     x2 brothers  . CAD Brother    Social History  Substance Use Topics  . Smoking status: Former Smoker    Quit date: 09/13/1993  . Smokeless tobacco: Never Used  . Alcohol Use: No    Review of Systems  Constitutional: Negative for fever.  Eyes: Positive for visual disturbance.  Gastrointestinal: Negative for nausea and vomiting.  Neurological: Positive for dizziness and weakness (not worse than new baseline since stroke). Negative for headaches.  All other systems reviewed and are negative.     Allergies  Trazodone and nefazodone  Home Medications   Prior to Admission medications   Medication Sig Start Date End Date Taking? Authorizing Provider  ALPRAZolam Prudy Feeler) 0.5 MG tablet Take 0.5 mg by mouth 2 (two) times daily.  02/21/15  Yes Historical Provider, MD  aspirin 325 MG tablet Take 1 tablet (325 mg total) by mouth daily. 05/27/15  Yes Shanker Levora Dredge, MD  clopidogrel (PLAVIX) 75 MG tablet Take 1 tablet (75 mg total) by  mouth daily. 06/11/15  Yes Evlyn Kanner Love, PA-C  glipiZIDE (GLUCOTROL) 10 MG tablet Take 0.5 tablets (5 mg total) by mouth daily before breakfast. 06/11/15  Yes Evlyn Kanner Love, PA-C  HYDROcodone-acetaminophen (NORCO) 10-325 MG tablet Take 1 tablet by mouth every 6 (six) hours as needed (pain). 06/11/15 01/17/16 Yes Pamela S Love, PA-C  LANTUS SOLOSTAR 100 UNIT/ML Solostar Pen Inject 37 Units into the skin at bedtime. 06/11/15  Yes Evlyn Kanner Love, PA-C  Menthol-Methyl Salicylate (MUSCLE RUB) 10-15 % CREA Apply 1 application topically 2 (two) times daily as needed for muscle pain.  06/11/15  Yes Evlyn Kanner Love, PA-C  mirtazapine (REMERON) 7.5 MG tablet Take 1 tablet (7.5 mg total) by mouth at bedtime. For depression/sleep 06/11/15  Yes Evlyn Kanner Love, PA-C  senna-docusate (SENOKOT-S) 8.6-50 MG tablet Take 1 tablet by mouth 2 (two) times daily. 06/11/15  Yes Evlyn Kanner Love, PA-C  simvastatin (ZOCOR) 40 MG tablet Take 40 mg by mouth at bedtime. 05/08/15  Yes Historical Provider, MD   BP 124/72 mmHg  Pulse 96  Temp(Src) 97.8 F (36.6 C) (Oral)  Resp 18  SpO2 98% Physical Exam  Constitutional: He is oriented to person, place, and time. He appears well-developed and well-nourished.  HENT:  Head: Normocephalic and atraumatic.  Right Ear: External ear normal.  Left Ear: External ear normal.  Nose: Nose normal.  Eyes: Right eye exhibits no discharge. Left eye exhibits no discharge.  Right eye round and reactive to light. + Nystagmus  Neck: Neck supple.  Cardiovascular: Normal rate, regular rhythm, normal heart sounds and intact distal pulses.   Pulmonary/Chest: Effort normal and breath sounds normal.  Abdominal: Soft. There is no tenderness.  Musculoskeletal: He exhibits no edema.  Neurological: He is alert and oriented to person, place, and time.  CN 2-12 grossly intact. 5/5 strength in LUE, LLE. RUE 4/5. RLE mildly decreased compared to LLE Finger to nose normal left sided, difficult but able to complete on the right.  Skin: Skin is warm and dry.  Nursing note and vitals reviewed.   ED Course  Procedures (including critical care time) Labs Review Labs Reviewed  COMPREHENSIVE METABOLIC PANEL - Abnormal; Notable for the following:    Glucose, Bld 211 (*)    Albumin 3.2 (*)    GFR calc non Af Amer 57 (*)    All other components within normal limits  GLUCOSE, CAPILLARY - Abnormal; Notable for the following:    Glucose-Capillary 201 (*)    All other components within normal limits  I-STAT CHEM 8, ED - Abnormal; Notable for the following:    Glucose, Bld 214 (*)     All other components within normal limits  PROTIME-INR  APTT  CBC  DIFFERENTIAL  HEMOGLOBIN A1C  LIPID PANEL  COMPREHENSIVE METABOLIC PANEL  CBC  I-STAT TROPOININ, ED    Imaging Review Ct Head Wo Contrast  06/14/2015   CLINICAL DATA:  Acute dizziness for 1 day. Recent diagnosis of left frontal infarct.  EXAM: CT HEAD WITHOUT CONTRAST  TECHNIQUE: Contiguous axial images were obtained from the base of the skull through the vertex without intravenous contrast.  COMPARISON:  05/24/2015 and prior exams.  FINDINGS: A subacute left frontal infarct is again identified with expected evolutionary changes.  Mild chronic small-vessel white matter ischemic changes, mild generalized cerebral volume loss and remote left parietal infarct again noted.  No acute intracranial abnormalities are identified, including mass lesion or mass effect, hydrocephalus, extra-axial fluid collection, midline shift, hemorrhage, or  acute infarction.  The visualized bony calvarium is unremarkable.  Mild deformity and calcification of the left globe again noted.  IMPRESSION: No evidence of acute intracranial abnormality.  Evolutionary changes of known subacute left frontal infarct.  Mild atrophy, chronic small-vessel white matter ischemic changes and remote left parietal infarct.   Electronically Signed   By: Harmon Pier M.D.   On: 06/14/2015 19:11   Mr Shirlee Latch Wo Contrast  06/14/2015   CLINICAL DATA:  74 year old diabetic male with dyslipidemia and renal insufficiency complaining of dizziness since yesterday. Recent stroke. Subsequent encounter.  EXAM: MRI HEAD WITHOUT CONTRAST  MRA HEAD WITHOUT CONTRAST  TECHNIQUE: Multiplanar, multiecho pulse sequences of the brain and surrounding structures were obtained without intravenous contrast. Angiographic images of the head were obtained using MRA technique without contrast.  COMPARISON:  06/14/2015 head CT.  05/25/2015 MR.  FINDINGS: MRI HEAD FINDINGS  Exam is motion degraded.  Several  subacute infarcts demonstrate expected evolution. Largest subacute infarct left frontal lobe (with mild laminar necrosis). Scattered small subacute infarcts noted bilaterally.  New tiny acute left caudate head infarct.  Remote small caudate infarct.  No intracranial hemorrhage.  Moderate small vessel disease type changes.  Global atrophy without hydrocephalus.  No intracranial mass lesion noted on this unenhanced exam.  Left phthisis bulbi.  Cervical medullary junction, pituitary region and pineal region unremarkable.  Polypoid opacification anterior aspect sphenoid sinus.  MRA HEAD FINDINGS  Anterior circulation without medium or large size vessel significant stenosis or occlusion.  Middle cerebral artery branch vessel narrowing and irregularity bilaterally.  Right vertebral artery ends in a posterior inferior cerebellar artery distribution. The right posterior inferior cerebellar artery is narrowed/ irregular and poorly delineated.  No significant stenosis left vertebral artery or basilar artery.  Nonvisualized anterior inferior cerebellar arteries.  Mild irregularity of portions of the posterior cerebral artery bilaterally with narrowing most notable involving distal branches.  No aneurysm noted.  IMPRESSION: MRI HEAD  Exam is motion degraded.  New tiny acute left caudate head infarct.  Several subacute infarcts demonstrate expected evolution. Largest subacute infarct left frontal lobe (with mild laminar necrosis). Scattered small subacute infarcts noted bilaterally.  Remote small caudate infarct.  No intracranial hemorrhage.  Moderate small vessel disease type changes.  Global atrophy without hydrocephalus.  Polypoid opacification anterior aspect sphenoid sinus.  MRA HEAD FINDINGS  Anterior circulation without medium or large size vessel significant stenosis or occlusion.  Middle cerebral artery branch vessel narrowing and irregularity bilaterally.  Right vertebral artery ends in a posterior inferior cerebellar  artery distribution. The right posterior inferior cerebellar artery is narrowed/ irregular and poorly delineated.  No significant stenosis left vertebral artery or basilar artery.  Nonvisualized anterior inferior cerebellar arteries.  Mild irregularity of portions of the posterior cerebral artery bilaterally with narrowing most notable involving distal branches.   Electronically Signed   By: Lacy Duverney M.D.   On: 06/14/2015 21:04   Mr Brain Wo Contrast  06/14/2015   CLINICAL DATA:  74 year old diabetic male with dyslipidemia and renal insufficiency complaining of dizziness since yesterday. Recent stroke. Subsequent encounter.  EXAM: MRI HEAD WITHOUT CONTRAST  MRA HEAD WITHOUT CONTRAST  TECHNIQUE: Multiplanar, multiecho pulse sequences of the brain and surrounding structures were obtained without intravenous contrast. Angiographic images of the head were obtained using MRA technique without contrast.  COMPARISON:  06/14/2015 head CT.  05/25/2015 MR.  FINDINGS: MRI HEAD FINDINGS  Exam is motion degraded.  Several subacute infarcts demonstrate expected evolution. Largest subacute infarct left  frontal lobe (with mild laminar necrosis). Scattered small subacute infarcts noted bilaterally.  New tiny acute left caudate head infarct.  Remote small caudate infarct.  No intracranial hemorrhage.  Moderate small vessel disease type changes.  Global atrophy without hydrocephalus.  No intracranial mass lesion noted on this unenhanced exam.  Left phthisis bulbi.  Cervical medullary junction, pituitary region and pineal region unremarkable.  Polypoid opacification anterior aspect sphenoid sinus.  MRA HEAD FINDINGS  Anterior circulation without medium or large size vessel significant stenosis or occlusion.  Middle cerebral artery branch vessel narrowing and irregularity bilaterally.  Right vertebral artery ends in a posterior inferior cerebellar artery distribution. The right posterior inferior cerebellar artery is narrowed/  irregular and poorly delineated.  No significant stenosis left vertebral artery or basilar artery.  Nonvisualized anterior inferior cerebellar arteries.  Mild irregularity of portions of the posterior cerebral artery bilaterally with narrowing most notable involving distal branches.  No aneurysm noted.  IMPRESSION: MRI HEAD  Exam is motion degraded.  New tiny acute left caudate head infarct.  Several subacute infarcts demonstrate expected evolution. Largest subacute infarct left frontal lobe (with mild laminar necrosis). Scattered small subacute infarcts noted bilaterally.  Remote small caudate infarct.  No intracranial hemorrhage.  Moderate small vessel disease type changes.  Global atrophy without hydrocephalus.  Polypoid opacification anterior aspect sphenoid sinus.  MRA HEAD FINDINGS  Anterior circulation without medium or large size vessel significant stenosis or occlusion.  Middle cerebral artery branch vessel narrowing and irregularity bilaterally.  Right vertebral artery ends in a posterior inferior cerebellar artery distribution. The right posterior inferior cerebellar artery is narrowed/ irregular and poorly delineated.  No significant stenosis left vertebral artery or basilar artery.  Nonvisualized anterior inferior cerebellar arteries.  Mild irregularity of portions of the posterior cerebral artery bilaterally with narrowing most notable involving distal branches.   Electronically Signed   By: Lacy Duverney M.D.   On: 06/14/2015 21:04   I have personally reviewed and evaluated these images and lab results as part of my medical decision-making.   EKG Interpretation None      MDM   Final diagnoses:  Cerebral infarction due to unspecified mechanism    Patient feels much better after meclizine but is still unable to ambulate safely. His dizziness has improved more though. CT scan unremarkable, MRI shows new small stroke but in the caudate. This would not explain all of his symptoms. They do  not visualize all of the cerebellar arteries, it is possible there is a small missed stroke. Discussed with neurology, Dr. Hosie Poisson, patient will be admitted to the hospitalist for overnight observation and further testing.    Pricilla Loveless, MD 06/14/15 [phone]

## 2015-06-15 DIAGNOSIS — E1122 Type 2 diabetes mellitus with diabetic chronic kidney disease: Secondary | ICD-10-CM | POA: Diagnosis not present

## 2015-06-15 DIAGNOSIS — E785 Hyperlipidemia, unspecified: Secondary | ICD-10-CM | POA: Diagnosis not present

## 2015-06-15 DIAGNOSIS — I639 Cerebral infarction, unspecified: Secondary | ICD-10-CM | POA: Diagnosis not present

## 2015-06-15 DIAGNOSIS — Z794 Long term (current) use of insulin: Secondary | ICD-10-CM | POA: Diagnosis not present

## 2015-06-15 DIAGNOSIS — I2510 Atherosclerotic heart disease of native coronary artery without angina pectoris: Secondary | ICD-10-CM | POA: Diagnosis not present

## 2015-06-15 DIAGNOSIS — I6523 Occlusion and stenosis of bilateral carotid arteries: Secondary | ICD-10-CM | POA: Diagnosis not present

## 2015-06-15 DIAGNOSIS — I6309 Cerebral infarction due to thrombosis of other precerebral artery: Secondary | ICD-10-CM

## 2015-06-15 DIAGNOSIS — I6522 Occlusion and stenosis of left carotid artery: Secondary | ICD-10-CM | POA: Diagnosis not present

## 2015-06-15 DIAGNOSIS — N182 Chronic kidney disease, stage 2 (mild): Secondary | ICD-10-CM | POA: Diagnosis not present

## 2015-06-15 LAB — COMPREHENSIVE METABOLIC PANEL
ALBUMIN: 3.1 g/dL — AB (ref 3.5–5.0)
ALT: 20 U/L (ref 17–63)
ANION GAP: 8 (ref 5–15)
AST: 19 U/L (ref 15–41)
Alkaline Phosphatase: 54 U/L (ref 38–126)
BUN: 12 mg/dL (ref 6–20)
CHLORIDE: 100 mmol/L — AB (ref 101–111)
CO2: 26 mmol/L (ref 22–32)
Calcium: 9.2 mg/dL (ref 8.9–10.3)
Creatinine, Ser: 1.2 mg/dL (ref 0.61–1.24)
GFR calc Af Amer: >60 mL/min (ref >60)
GFR calc non Af Amer: 58 mL/min — ABNORMAL LOW (ref >60)
GLUCOSE: 199 mg/dL — AB (ref 65–99)
POTASSIUM: 4.1 mmol/L (ref 3.5–5.1)
SODIUM: 134 mmol/L — AB (ref 135–145)
TOTAL PROTEIN: 7.1 g/dL (ref 6.5–8.1)
Total Bilirubin: 0.7 mg/dL (ref 0.3–1.2)

## 2015-06-15 LAB — CBC
HEMATOCRIT: 44.5 % (ref 39.0–52.0)
HEMOGLOBIN: 15.4 g/dL (ref 13.0–17.0)
MCH: 31.2 pg (ref 26.0–34.0)
MCHC: 34.6 g/dL (ref 30.0–36.0)
MCV: 90.1 fL (ref 78.0–100.0)
Platelets: 205 10*3/uL (ref 150–400)
RBC: 4.94 MIL/uL (ref 4.22–5.81)
RDW: 12.9 % (ref 11.5–15.5)
WBC: 7.7 10*3/uL (ref 4.0–10.5)

## 2015-06-15 LAB — GLUCOSE, CAPILLARY
Glucose-Capillary: 181 mg/dL — ABNORMAL HIGH (ref 65–99)
Glucose-Capillary: 203 mg/dL — ABNORMAL HIGH (ref 65–99)
Glucose-Capillary: 225 mg/dL — ABNORMAL HIGH (ref 65–99)
Glucose-Capillary: 277 mg/dL — ABNORMAL HIGH (ref 65–99)

## 2015-06-15 MED ORDER — HYDROCODONE-ACETAMINOPHEN 10-325 MG PO TABS
1.0000 | ORAL_TABLET | Freq: Four times a day (QID) | ORAL | Status: DC | PRN
  Administered 2015-06-15 – 2015-06-17 (×5): 1 via ORAL
  Filled 2015-06-15 (×6): qty 1

## 2015-06-15 MED ORDER — INSULIN GLARGINE 100 UNIT/ML ~~LOC~~ SOLN
30.0000 [IU] | Freq: Every day | SUBCUTANEOUS | Status: DC
  Administered 2015-06-15: 30 [IU] via SUBCUTANEOUS
  Filled 2015-06-15 (×3): qty 0.3

## 2015-06-15 MED ORDER — TAMSULOSIN HCL 0.4 MG PO CAPS
0.4000 mg | ORAL_CAPSULE | Freq: Every day | ORAL | Status: DC
  Administered 2015-06-15: 0.4 mg via ORAL
  Filled 2015-06-15: qty 1

## 2015-06-15 MED ORDER — BISACODYL 10 MG RE SUPP
10.0000 mg | Freq: Every day | RECTAL | Status: DC | PRN
  Administered 2015-06-18: 10 mg via RECTAL
  Filled 2015-06-15: qty 1

## 2015-06-15 NOTE — Progress Notes (Signed)
Pt unable to void after multiple attempts.  Bladder scanner showed 807 ml, MD notified and ordered in and out cath.  removed by in and out cath.  Will continue to monitor.   Estanislado Emms, RN

## 2015-06-15 NOTE — Progress Notes (Signed)
3m08 Mr Lucas Morgan LBM reortedly 10/1 Patient is insisting he needs an enema. Thanks

## 2015-06-15 NOTE — Progress Notes (Signed)
Called daughter Eunice Blase to make her aware that we placed a foley catheter in patient to drain his urine and explained to her his complaints of pain. Told her i have given tylenol and a hotpack and the doctor is aware. She stated understanding of foley placement and asked if i could ask the doctor to renew his pain pills for back pain (vicodin) since he "had not had any since before the ER and he gets it 4 times a day for back pain." called Dr Butler Denmark who reordered prn.

## 2015-06-15 NOTE — Progress Notes (Signed)
15m06 Mr Lucas Morgan >900 Thanks

## 2015-06-15 NOTE — Evaluation (Signed)
Physical Therapy Evaluation Patient Details Name: Lucas Morgan MRN: [id number] DOB: 01-28-1941 Today's Date: 06/15/2015   History of Present Illness  [name] is a 74 y.o. 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 The patient was discharged home on 10/27 after being admitted for above-mentioned CVA. Family noted that he is having some trouble with ambulating and has complaints of dizziness and therefore he was brought back to the hospital. CT of the head performed in the ER was negative. MRI of the brain revealed a new "tiny" acute left caudate head infarct in several subacute infarcts which demonstrated expected evolution.  Clinical Impression  Pt admitted with above diagnosis. Pt currently with functional limitations due to the deficits listed below (see PT Problem List). Made good progress with CIR and was ambulating at a supervision level without an assistive device PTA. Pt with no further dizziness symptoms today however demonstrates right lateral drift and stagger at times, requring min assist for balance. Family present and report pt is better as compared to yesterday but not at baseline after leaving CIR. They can provide as much assist as needed at d/c and [name] would greatly benefit from HHPT. Signs and symptoms of dizziness not present today and demonstrates a negative dix-hallpike test, however symptoms may be masked by medication. See orthostatic vitals for noted drop, under vitals tab. Pt will benefit from skilled PT to increase their independence and safety with mobility to allow discharge to the venue listed below.       Follow Up Recommendations Home health PT;Supervision for mobility/OOB    Equipment Recommendations  None recommended by PT    Recommendations for Other Services       Precautions / Restrictions Precautions Precautions:  Fall Restrictions Weight Bearing Restrictions: No      Mobility  Bed Mobility Overal bed mobility: Modified Independent             General bed mobility comments: extra time  Transfers Overall transfer level: Needs assistance Equipment used: Rolling walker (2 wheeled) Transfers: Sit to/from Stand Sit to Stand: Min guard         General transfer comment: Close guard for safety. VC for hand placement. Difficulty grasping RW initially.  Ambulation/Gait Ambulation/Gait assistance: Min assist Ambulation Distance (Feet): 130 Feet Assistive device: Rolling walker (2 wheeled) Gait Pattern/deviations: Step-through pattern;Decreased stride length;Decreased dorsiflexion - right;Ataxic;Staggering right;Drifts right/left   Gait velocity interpretation: at or above normal speed for age/gender General Gait Details: Min assist intermittently for Rt lateral lean and stagger at times. VC for awareness and upright posture. Some assist with walker control and for proximity. VC for wider stance with turns.  Stairs            Wheelchair Mobility    Modified Rankin (Stroke Patients Only) Modified Rankin (Stroke Patients Only) Pre-Morbid Rankin Score: Moderately severe disability Modified Rankin: Moderately severe disability     Balance Overall balance assessment: Needs assistance Sitting-balance support: No upper extremity supported;Feet supported Sitting balance-Leahy Scale: Fair     Standing balance support: No upper extremity supported Standing balance-Leahy Scale: Fair                               Pertinent Vitals/Pain Pain Assessment: Faces Faces Pain Scale: Hurts even more Pain Location: abdomen    Home Living Family/patient expects to be discharged to:: Private residence  Living Arrangements: Children Available Help at Discharge: Family;Available 24 hours/day Type of Home: House Home Access: Level entry     Home Layout: One level Home  Equipment: Cane - single point;Shower seat;Walker - 2 wheels;Wheelchair - manual      Prior Function Level of Independence: Needs assistance   Gait / Transfers Assistance Needed: ambulating at a supervision level without assistive device after leaving CIR  ADL's / Homemaking Assistance Needed: Could dress self after leaving CIR, needed assist for bathing safety        Hand Dominance   Dominant Hand: Right    Extremity/Trunk Assessment   Upper Extremity Assessment: Defer to OT evaluation           Lower Extremity Assessment: RLE deficits/detail RLE Deficits / Details: Slight weakness grossly compared to LLE       Communication   Communication: Expressive difficulties  Cognition Arousal/Alertness: Awake/alert Behavior During Therapy: WFL for tasks assessed/performed Overall Cognitive Status: Within Functional Limits for tasks assessed                      General Comments General comments (skin integrity, edema, etc.): Reports spinning upon standing yesterday - not an issue today. Assessed with dix-hallpike, no exacerbation of symptoms, no obvious nystagmus noted, however, pt has been given a medication for dizziness which could mask signs/symptoms. Daughter present, very supportive. Reports function is better today from yesterday but still not at baseline from leaving CIR. Was planning on HHPT starting monday. Family can provide 24/7 assist.    Exercises        Assessment/Plan    PT Assessment Patient needs continued PT services  PT Diagnosis Abnormality of gait;Difficulty walking;Acute pain;Hemiplegia dominant side   PT Problem List Decreased strength;Decreased range of motion;Decreased balance;Decreased activity tolerance;Decreased mobility;Decreased coordination;Decreased knowledge of use of DME;Pain;Impaired tone  PT Treatment Interventions DME instruction;Gait training;Functional mobility training;Therapeutic activities;Therapeutic exercise;Balance  training;Neuromuscular re-education;Patient/family education   PT Goals (Current goals can be found in the Care Plan section) Acute Rehab PT Goals Patient Stated Goal: Feel better PT Goal Formulation: With patient/family Time For Goal Achievement: 06/29/15 Potential to Achieve Goals: Good    Frequency Min 4X/week   Barriers to discharge        Co-evaluation               End of Session Equipment Utilized During Treatment: Gait belt Activity Tolerance: Patient tolerated treatment well Patient left: in chair;with call bell/phone within reach;with chair alarm set;with family/visitor present Nurse Communication: Mobility status    Functional Assessment Tool Used: clinical Observation Functional Limitation: Mobility: Walking and moving around Mobility: Walking and Moving Around Current Status [id number]): At least 20 percent but less than 40 percent impaired, limited or restricted Mobility: Walking and Moving Around Goal Status [phone]): At least 1 percent but less than 20 percent impaired, limited or restricted    Time: 1220-1256 PT Time Calculation (min) (ACUTE ONLY): 36 min   Charges:   PT Evaluation $Initial PT Evaluation Tier I: 1 Procedure PT Treatments $Gait Training: 8-22 mins   PT G Codes:   PT G-Codes **NOT FOR INPATIENT CLASS** Functional Assessment Tool Used: clinical Observation Functional Limitation: Mobility: Walking and moving around Mobility: Walking and Moving Around Current Status ([id number]): At least 20 percent but less than 40 percent impaired, limited or restricted Mobility: Walking and Moving Around Goal Status [phone]): At least 1 percent but less than 20 percent impaired, limited or restricted    Berton Mount  06/15/2015, 2:05 PM  Charlsie Merles, PT [phone]

## 2015-06-15 NOTE — Evaluation (Signed)
Occupational Therapy Evaluation Patient Details Name: Lucas Morgan MRN: [id number] DOB: 07/11/1941 Today's Date: 06/15/2015    History of Present Illness Lucas Morgan is a 74 y.o. 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 The patient was discharged home on 10/27 after being admitted for above-mentioned CVA. Family noted that he is having some trouble with ambulating and has complaints of dizziness and therefore he was brought back to the hospital. CT of the head performed in the ER was negative. MRI of the brain revealed a new "tiny" acute left caudate head infarct in several subacute infarcts which demonstrated expected evolution.   Clinical Impression   Pt was performing at a supervision level in self care and assisted for IADL when discharged from CIR.  Presents with decreased balance, residual R UE weakness and incoordination, and impaired vision interfering with ability to perform ADL and ADL transfers. Will follow acutely.  Family is supportive and anticipate he will be able to return home with HHOT.  Follow Up Recommendations  Home health OT;Supervision/Assistance - 24 hour    Equipment Recommendations  None recommended by OT    Recommendations for Other Services       Precautions / Restrictions Precautions Precautions: Fall Restrictions Weight Bearing Restrictions: No      Mobility Bed Mobility Overal bed mobility: Modified Independent             General bed mobility comments: used rail  Transfers Overall transfer level: Needs assistance Equipment used: Rolling walker (2 wheeled)   Sit to Stand: Min guard         General transfer comment: Close guard for safety. VC for hand placement. Difficulty grasping RW initially.    Balance     Sitting balance-Leahy Scale: Good       Standing balance-Leahy Scale: Fair                               ADL Overall ADL's : Needs assistance/impaired Eating/Feeding: Set up;Sitting   Grooming: Wash/dry hands;Standing;Supervision/safety   Upper Body Bathing: Supervision/ safety;Sitting   Lower Body Bathing: Sit to/from stand;Min guard   Upper Body Dressing : Set up;Sitting   Lower Body Dressing: Sit to/from stand;Min guard   Toilet Transfer: Min guard;Ambulation   Toileting- Clothing Manipulation and Hygiene: Min guard;Sit to/from stand       Functional mobility during ADLs: Min guard;Rolling walker General ADL Comments: Drifts R when ambulating.     Vision Tracking/Visual Pursuits: Left eye does not track medially;Decreased smoothness of horizontal tracking Visual Fields: Right homonymous hemianopsia   Perception     Praxis      Pertinent Vitals/Pain Pain Assessment: Faces     Hand Dominance Right   Extremity/Trunk Assessment Upper Extremity Assessment Upper Extremity Assessment: RUE deficits/detail RUE Deficits / Details: 5/5 shoulder, 4+/5 elbow, 4-/5 grip, residual weakness fingers 4 and 5 from previous CVA RUE Coordination: decreased fine motor;decreased gross motor   Lower Extremity Assessment Lower Extremity Assessment: Defer to PT evaluation       Communication Communication Communication: Expressive difficulties   Cognition Arousal/Alertness: Awake/alert Behavior During Therapy: WFL for tasks assessed/performed Overall Cognitive Status: Difficult to assess           Safety/Judgement: Decreased awareness of safety;Decreased awareness of deficits         General Comments       Exercises  Shoulder Instructions      Home Living Family/patient expects to be discharged to:: Private residence Living Arrangements: Children Available Help at Discharge: Family;Available 24 hours/day Type of Home: House Home Access: Level entry     Home Layout: One level     Bathroom Shower/Tub: Arts development officer:  Standard Bathroom Accessibility: Yes How Accessible: Accessible via walker Home Equipment: Cane - single point;Shower seat;Walker - 2 wheels;Wheelchair - manual;Grab bars - tub/shower      Lives With: Daughter    Prior Functioning/Environment Level of Independence: Needs assistance  Gait / Transfers Assistance Needed: ambulating at a supervision level without assistive device after leaving CIR ADL's / Homemaking Assistance Needed: Could dress self after leaving CIR, needed assist for bathing safety and all IADL. Communication / Swallowing Assistance Needed: aphasia Comments: pt did his own yard work prior to CVA    OT Diagnosis: Generalized weakness;Cognitive deficits;Disturbance of vision;Hemiplegia dominant side;Blindness and low vision   OT Problem List: Decreased strength;Decreased activity tolerance;Impaired balance (sitting and/or standing);Impaired vision/perception;Decreased coordination;Decreased safety awareness;Impaired UE functional use   OT Treatment/Interventions: Self-care/ADL training;DME and/or AE instruction;Therapeutic activities;Cognitive remediation/compensation;Visual/perceptual remediation/compensation;Patient/family education;Balance training    OT Goals(Current goals can be found in the care plan section) Acute Rehab OT Goals Patient Stated Goal: Feel better OT Goal Formulation: With patient/family Time For Goal Achievement: 06/29/15 Potential to Achieve Goals: Good ADL Goals Pt Will Perform Grooming: with supervision;standing Pt Will Perform Lower Body Bathing: with supervision;sit to/from stand Pt Will Perform Lower Body Dressing: with supervision;sit to/from stand Pt Will Transfer to Toilet: with supervision;ambulating Pt Will Perform Toileting - Clothing Manipulation and hygiene: with supervision;sit to/from stand Pt Will Perform Tub/Shower Transfer: Tub transfer;with supervision;ambulating;shower seat;rolling walker;grab bars  OT Frequency: Min  2X/week   Barriers to D/C:            Co-evaluation              End of Session Equipment Utilized During Treatment: Gait belt;Rolling walker  Activity Tolerance: Patient tolerated treatment well Patient left: in bed;with call bell/phone within reach;with family/visitor present   Time: 1457-1520 OT Time Calculation (min): 23 min Charges:  OT General Charges $OT Visit: 1 Procedure OT Evaluation $Initial OT Evaluation Tier I: 1 Procedure OT Treatments $Self Care/Home Management : 8-22 mins G-Codes: OT G-codes **NOT FOR INPATIENT CLASS** Functional Assessment Tool Used: clinical judgement Functional Limitation: Self care Self Care Current Status (Z6109): At least 20 percent but less than 40 percent impaired, limited or restricted Self Care Goal Status (U0454): At least 1 percent but less than 20 percent impaired, limited or restricted  Evern Bio 06/15/2015, 4:06 PM  [phone]

## 2015-06-15 NOTE — Progress Notes (Signed)
Patient asleep, resting comfortablly

## 2015-06-15 NOTE — Progress Notes (Signed)
Per previous RN, family complained of a rash to patient's back and wanted lotion for it. Previous nurse did not see and rash, and this RN looked carefully when I received patient and I see no rash anywhere. Offered patient body lotion if his skin was dry or itchy and he politely declined. Will cont to monitor

## 2015-06-15 NOTE — Progress Notes (Signed)
Received patient from previous nurse, and bladder scanned patient. Patient has not voided since this am per previous nurse and had to be I and O cathed this am per previous nurse. Called MD who ordered foley catheter to be placed. Foley placed with two RNs, first attempt encountered some mild resistance, second try went in smoothly with urine flash once placed. Patient complaining of mild burning after insertion. Tylenol and hot pack given to patient. 600 cc of urine in bag after insertion.  Will continue to monitor

## 2015-06-15 NOTE — Progress Notes (Signed)
[facility] Progress Note   Lucas Morgan  VHQ:[id number]  DOB: 05/29/1941  DOA: 06/14/2015 PCP: Abran Richard, PA-C  Brief narrative: Lucas Morgan is a 74 y.o. male with recent CVA and residual symptoms of expressive a phase he, coronary artery disease, diabetes mellitus, chronic kidney disease and dyslipidemia. The patient was discharged home on 10/27 after being admitted for above-mentioned CVA. Family noted that he is having some trouble with ambulating and has complaints of dizziness and therefore he was brought back to the hospital. CT of the head performed in the ER was negative. MRI of the Lucas Morgan revealed a new "tiny" acute left caudate head infarct in several subacute infarcts which demonstrated expected evolution.   Subjective: Having significant trouble with expressive aphasia and therefore difficult to obtain a history. Does not complain of dizziness when at rest. Aid who is at bedside said that he had trouble with balance when he got up to use the bedside commode. He is tearful and asking if he can go home.  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  -Continue Lantus at lower than home dose and sliding scale insulin  Urinary retention -Required an I and O cath last night and again this morning - We'll start Flomax-if he continues to retain urine, will place Foley catheter -Outpatient urology follow-up  Dyslipidemia -Continue  statin  Carotid stenosis -60% left internal carotid and 50% right internal carotid stenosis  Thyroid nodule -Noted on imaging during last admission-TSH normal-outpatient follow-up recommended with ultrasound  Code Status:     Code Status Orders        Start     Ordered   06/14/15 2245  Full code   Continuous     06/14/15 2245     Family Communication: Disposition Plan: Follow up on PT eval DVT prophylaxis: Lovenox Consultants: Neurology  Antibiotics: Anti-infectives    None      Objective: Filed Weights   06/14/15 2213  Weight: 74.571 kg (164 lb 6.4 oz)    Intake/Output Summary (Last 24 hours) at 06/15/15 1114 Last data filed at 06/15/15 0945  Gross per 24 hour  Intake    300 ml  Output   1700 ml  Net  -1400 ml     Vitals Filed Vitals:   06/15/15 0400 06/15/15 0600 06/15/15 0800 06/15/15 1000  BP: 133/94 103/59 122/78 146/90  Pulse: 106 92 107 108  Temp: 97.7 F (36.5 C) 98.5 F (36.9 C) 98.6 F (37 C)   TempSrc: Oral Oral Oral   Resp: 18 16 16 16   Height:      Weight:      SpO2: 97% 97% 96% 96%    Exam:  General:  Pt is alert, not in acute distress  HEENT: No icterus, No thrush, oral mucosa moist  Cardiovascular: regular rate and rhythm, S1/S2 No murmur  Respiratory: clear to auscultation bilaterally   Abdomen: Soft, +Bowel sounds, non tender, non distended, no guarding  MSK: No LE edema, cyanosis or clubbing  Data Reviewed: Basic Metabolic Panel:  Recent Labs Lab 06/10/15 0535 06/10/15 1200 06/14/15 1707 06/14/15 1711 06/15/15 0625  NA  --  137 137 135 134*  K  --  4.2 4.2 4.3 4.1  CL  --  104 101 102 100*  CO2  --  25  --  24 26  GLUCOSE  --  214* 214* 211* 199*  BUN  --  21* CREATININE 1.30* 1.32* 1.20 1.22 1.20  CALCIUM  --  8.8*  --  9.3 9.2   Liver Function Tests:  Recent Labs Lab 06/14/15 1711 06/15/15 0625  AST 25 19  ALT 24 20  ALKPHOS 54 54  BILITOT 0.5 0.7  PROT 7.0 7.1  ALBUMIN 3.2* 3.1*    No results for input(s): LIPASE, AMYLASE in the last 168 hours. No results for input(s): AMMONIA in the last 168 hours. CBC:  Recent Labs Lab 06/14/15 1707 06/14/15 1711 06/15/15 0625  WBC  --  10.4 7.7  NEUTROABS  --  7.2  --   HGB 16.0 15.4 15.4  HCT 47.0 44.3 44.5  MCV  --  91.0 90.1  PLT  --  212 205   Cardiac Enzymes: No results for input(s): CKTOTAL, CKMB, CKMBINDEX, TROPONINI in the last 168 hours. BNP (last 3 results) No results for input(s): BNP in the last 8760 hours.  ProBNP (last 3 results) No results for input(s): PROBNP in the last 8760 hours.  CBG:  Recent Labs Lab 06/10/15 1601 06/10/15 2031 06/11/15 0639 06/14/15 2244 06/15/15 0625  GLUCAP 166* 207* 138* 201* 181*    No results found for this or any previous visit (from the past 240 hour(s)).   Studies: Ct Head Wo Contrast  06/14/2015   CLINICAL DATA:  Acute dizziness for 1 day. Recent diagnosis of left frontal infarct.  EXAM: CT HEAD WITHOUT CONTRAST  TECHNIQUE: Contiguous axial images were obtained from the base of the skull through the vertex without intravenous contrast.  COMPARISON:  05/24/2015 and prior exams.  FINDINGS: A subacute left frontal infarct is again identified with expected evolutionary changes.  Mild chronic small-vessel white matter ischemic changes, mild generalized cerebral volume loss and remote left parietal infarct again noted.  No acute intracranial abnormalities are identified, including mass lesion or mass effect, hydrocephalus, extra-axial fluid collection, midline shift, hemorrhage, or acute infarction.  The visualized bony calvarium is unremarkable.  Mild deformity and calcification of the left globe again noted.  IMPRESSION: No evidence of acute intracranial abnormality.  Evolutionary changes of known subacute left frontal infarct.  Mild atrophy, chronic small-vessel white matter ischemic changes and remote left parietal infarct.   Electronically Signed   By: Harmon Pier  M.D.   On: 06/14/2015 19:11   Mr Lucas Morgan Wo Contrast  06/14/2015   CLINICAL DATA:  74 year old diabetic male with dyslipidemia and renal insufficiency complaining of dizziness since yesterday. Recent stroke. Subsequent encounter.  EXAM: MRI HEAD WITHOUT CONTRAST  MRA HEAD WITHOUT CONTRAST  TECHNIQUE: Multiplanar, multiecho pulse sequences of the Lucas Morgan and surrounding structures were obtained without intravenous contrast. Angiographic images of the head were obtained using MRA technique without contrast.  COMPARISON:  06/14/2015 head CT.  05/25/2015 MR.  FINDINGS: MRI HEAD FINDINGS  Exam is motion degraded.  Several subacute infarcts demonstrate expected evolution. Largest subacute infarct left frontal lobe (with mild laminar necrosis). Scattered small subacute infarcts noted bilaterally.  New tiny acute left caudate head infarct.  Remote small caudate infarct.  No intracranial hemorrhage.  Moderate small vessel disease type changes.  Global atrophy without hydrocephalus.  No intracranial mass lesion noted on this unenhanced exam.  Left phthisis bulbi.  Cervical medullary junction, pituitary region and pineal region unremarkable.  Polypoid opacification anterior aspect sphenoid sinus.  MRA HEAD FINDINGS  Anterior circulation without medium or large size vessel significant stenosis or occlusion.  Middle cerebral artery branch vessel narrowing and irregularity bilaterally.  Right vertebral artery ends in a posterior inferior cerebellar artery distribution. The right posterior inferior cerebellar artery is narrowed/ irregular and poorly delineated.  No significant stenosis left vertebral artery or basilar artery.  Nonvisualized anterior inferior cerebellar arteries.  Mild irregularity of portions of the posterior cerebral artery bilaterally with narrowing most notable involving distal branches.  No aneurysm noted.  IMPRESSION: MRI HEAD  Exam is motion degraded.  New tiny acute left caudate head infarct.  Several  subacute infarcts demonstrate expected evolution. Largest subacute infarct left frontal lobe (with mild laminar necrosis). Scattered small subacute infarcts noted bilaterally.  Remote small caudate infarct.  No intracranial hemorrhage.  Moderate small vessel disease type changes.  Global atrophy without hydrocephalus.  Polypoid opacification anterior aspect sphenoid sinus.  MRA HEAD FINDINGS  Anterior circulation without medium or large size vessel significant stenosis or occlusion.  Middle cerebral artery branch vessel narrowing and irregularity bilaterally.  Right vertebral artery ends in a posterior inferior cerebellar artery distribution. The right posterior inferior cerebellar artery is narrowed/ irregular and poorly delineated.  No significant stenosis left vertebral artery or basilar artery.  Nonvisualized anterior inferior cerebellar arteries.  Mild irregularity of portions of the posterior cerebral artery bilaterally with narrowing most notable involving distal branches.   Electronically Signed   By: Lacy Duverney M.D.   On: 06/14/2015 21:04   Mr Lucas Morgan Wo Contrast  06/14/2015   CLINICAL DATA:  74 year old diabetic male with dyslipidemia and renal insufficiency complaining of dizziness since yesterday. Recent stroke. Subsequent encounter.  EXAM: MRI HEAD WITHOUT CONTRAST  MRA HEAD WITHOUT CONTRAST  TECHNIQUE: Multiplanar, multiecho pulse sequences of the Lucas Morgan and surrounding structures were obtained without intravenous contrast. Angiographic images of the head were obtained using MRA technique without contrast.  COMPARISON:  06/14/2015 head CT.  05/25/2015 MR.  FINDINGS: MRI HEAD FINDINGS  Exam is motion degraded.  Several subacute infarcts demonstrate expected evolution. Largest subacute infarct left frontal lobe (with mild laminar necrosis). Scattered small subacute infarcts noted bilaterally.  New tiny acute left caudate head infarct.  Remote small caudate infarct.  No intracranial hemorrhage.   Moderate small vessel disease type changes.  Global atrophy without hydrocephalus.  No intracranial mass lesion noted on this unenhanced exam.  Left phthisis bulbi.  Cervical medullary junction, pituitary region and pineal region unremarkable.  Polypoid opacification anterior aspect sphenoid sinus.  MRA HEAD FINDINGS  Anterior circulation without medium or large size vessel significant stenosis or occlusion.  Middle cerebral artery branch vessel narrowing and irregularity bilaterally.  Right vertebral artery ends in a posterior inferior cerebellar artery distribution. The right posterior inferior cerebellar artery is narrowed/ irregular and poorly delineated.  No significant stenosis left vertebral artery or basilar artery.  Nonvisualized anterior inferior cerebellar arteries.  Mild irregularity of portions of the posterior cerebral artery bilaterally with narrowing most notable involving distal branches.  No aneurysm noted.  IMPRESSION: MRI HEAD  Exam is motion degraded.  New tiny acute left caudate head infarct.  Several subacute infarcts demonstrate expected evolution. Largest subacute infarct left  frontal lobe (with mild laminar necrosis). Scattered small subacute infarcts noted bilaterally.  Remote small caudate infarct.  No intracranial hemorrhage.  Moderate small vessel disease type changes.  Global atrophy without hydrocephalus.  Polypoid opacification anterior aspect sphenoid sinus.  MRA HEAD FINDINGS  Anterior circulation without medium or large size vessel significant stenosis or occlusion.  Middle cerebral artery branch vessel narrowing and irregularity bilaterally.  Right vertebral artery ends in a posterior inferior cerebellar artery distribution. The right posterior inferior cerebellar artery is narrowed/ irregular and poorly delineated.  No significant stenosis left vertebral artery or basilar artery.  Nonvisualized anterior inferior cerebellar arteries.  Mild irregularity of portions of the  posterior cerebral artery bilaterally with narrowing most notable involving distal branches.   Electronically Signed   By: Lacy Duverney M.D.   On: 06/14/2015 21:04    Scheduled Meds:  Scheduled Meds: . aspirin  325 mg Oral Daily  . clopidogrel  75 mg Oral Daily  . enoxaparin (LOVENOX) injection  40 mg Subcutaneous Q24H  . insulin aspart  0-15 Units Subcutaneous TID WC  . insulin aspart  0-5 Units Subcutaneous QHS  . simvastatin  40 mg Oral QHS  . tamsulosin  0.4 mg Oral Daily   Continuous Infusions:   Time spent on care of this patient: 35 min   Caretha Rumbaugh, MD 06/15/2015, 11:14 AM    [facility] Office  [phone] Pager - Text Page per [email or web address] If 7PM-7AM, please contact night-coverage [email or web address]

## 2015-06-15 NOTE — Progress Notes (Signed)
STROKE TEAM PROGRESS NOTE   HPI Lucas Morgan is an 74 y.o. male hx of DM, HLD, CAD, recent CVA (discharged 9/28) presenting with acute onset of dizziness and difficulty walking. Reports symptoms started 2-3 days ago and has been constant since then. Notes getting a spinning sensation when he turns his head from side to side. Also notes some blurred vision. Family reports when he was discharged from rehab he was able to walk with a walker, now currently unable to walk due to gait instability.   Has residual right-sided weakness and speech deficits from recent CVA. MRI brain imaging reviewed. Shows new acute left caudate infarct, several subacute infarcts bilaterally. Right vertebral artery ends in PICA distribution. PICA is narrowed/irregular and poorly delineated. Carotid doppler from 9/13 shows 1-39% R ICA stenosis adn 60-79% left ICA stenosis. TEE was unremarkable.   Patients dizziness improved after receiving dose of meclizine in the ED>    Date last known well: 06/11/2015 Time last known well: unclear tPA Given: no, outside tPA window Modified Rankin: Rankin Score=3    SUBJECTIVE (INTERVAL HISTORY) The patient is aphasic but he was able to tell me that he feels very constipated. The patient's son arrived. Dr. Pearlean Brownie spoke with him at length regarding plans for carotid surgery.    OBJECTIVE Temp:  [97.6 F (36.4 C)-98.6 F (37 C)] 98.5 F (36.9 C) (10/02 0600) Pulse Rate:  [87-106] 92 (10/02 0600) Cardiac Rhythm:  [-] Normal sinus rhythm (10/01 2249) Resp:  [16-25] 16 (10/02 0600) BP: (103-155)/(59-94) 103/59 mmHg (10/02 0600) SpO2:  [93 %-98 %] 97 % (10/02 0600) Weight:  [74.571 kg (164 lb 6.4 oz)] 74.571 kg (164 lb 6.4 oz) (10/01 2213)  CBC:  Recent Labs Lab 06/14/15 1711 06/15/15 0625  WBC 10.4 7.7  NEUTROABS 7.2  --   HGB 15.4 15.4  HCT 44.3 44.5  MCV 91.0 90.1  PLT 212 205    Basic Metabolic Panel:  Recent Labs Lab 06/14/15 1711 06/15/15 0625  NA 135 134*   K 4.3 4.1  CL 102 100*  CO2 24 26  GLUCOSE 211* 199*  BUN 15 12  CREATININE 1.22 1.20  CALCIUM 9.3 9.2    Lipid Panel:    Component Value Date/Time   CHOL 119 05/25/2015 0705   TRIG 129 05/25/2015 0705   HDL 33* 05/25/2015 0705   CHOLHDL 3.6 05/25/2015 0705   VLDL 26 05/25/2015 0705   LDLCALC 60 05/25/2015 0705   HgbA1c:  Lab Results  Component Value Date   HGBA1C 7.5* 05/25/2015   Urine Drug Screen: No results found for: LABOPIA, COCAINSCRNUR, LABBENZ, AMPHETMU, THCU, LABBARB    IMAGING  Ct Head Wo Contrast 06/14/2015    No evidence of acute intracranial abnormality.  Evolutionary changes of known subacute left frontal infarct.  Mild atrophy, chronic small-vessel white matter ischemic changes and remote left parietal infarct.       Mr Shirlee Latch Wo Contrast 06/14/2015    MRI HEAD   Exam is motion degraded.  New tiny acute left caudate head infarct.  Several subacute infarcts demonstrate expected evolution. Largest subacute infarct left frontal lobe (with mild laminar necrosis). Scattered small subacute infarcts noted bilaterally.  Remote small caudate infarct.  No intracranial hemorrhage.  Moderate small vessel disease type changes.  Global atrophy without hydrocephalus.  Polypoid opacification anterior aspect sphenoid sinus.    MRA HEAD  Anterior circulation without medium or large size vessel significant stenosis or occlusion.  Middle cerebral artery branch vessel narrowing  and irregularity bilaterally.  Right vertebral artery ends in a posterior inferior cerebellar artery distribution. The right posterior inferior cerebellar artery is narrowed/ irregular and poorly delineated.  No significant stenosis left vertebral artery or basilar artery.  Nonvisualized anterior inferior cerebellar arteries.  Mild irregularity of portions of the posterior cerebral artery bilaterally with narrowing most notable involving distal branches.      CTA Neck 05/28/2015 5 mm segment of 60%  stenosis LEFT internal carotid artery associated with fibromuscular dysplasia versus atherosclerosis, and dissection flap extending to the central lumen. 50% stenosis RIGHT ICA origin. 17 mm LEFT thyroid nodule for which follow-up dedicated thyroid sonogram is recommended on a nonemergent basis.   PHYSICAL EXAM Pleasant elderly Caucasian male not in distress. . Afebrile. Head is nontraumatic. Neck is supple without bruit.    Cardiac exam no murmur or gallop. Lungs are clear to auscultation. Distal pulses are well felt.   Neurologic Exam  Mental Status: Alert, oriented, thought content appropriate. Mild aphasia Able to follow simple commands without difficulty. Cranial Nerves: II: Discs not visualized; Visual fields grossly normal, pupils equal, round, reactive to light and accommodation III,IV, VI: ptosis not present, extra-ocular motions intact bilaterally V,VII: Slight right facial droop, facial light touch sensation normal bilaterally VIII: hearing normal bilaterally IX,X: gag reflex present XI: bilateral shoulder shrug XII: midline tongue extension Motor: Right :Upper extremity 4/5Left: Upper extremity 5/5 Lower extremity 5/5Lower extremity 5/5 Tone and bulk:normal tone throughout; no atrophy noted Sensory: Pinprick and light touch intact throughout, bilaterally Cerebellar: Unable to test Gait: Not tested     ASSESSMENT/PLAN Lucas Morgan is a 74 y.o. 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 He did not receive IV t-PA due to recent stroke and late presentation.  Stroke:  Dominant  infarct embolic from left internal carotid artery  stenosis.  Resultant  Mild right hemiparesis  MRI  multiple infarcts as noted above  MRA  nonvisualized anterior inferior cerebellar arteries  Carotid Doppler 05/27/2015 -  Findings suggest 1-39% right internal carotid artery stenosis and 60-79% left internal carotid artery stenosis. The right vertebral artery exhibits an atypical antegrade waveform. which suggests distal stenosis likely.  TEE - 05/26/2015 - EF 30-35%. No cardiac source of emboli identified.  LDL 60  HgbA1c 05/25/2015 - 7.5  VTE prophylaxis  Diet heart healthy/carb modified Room service appropriate?: Yes; Fluid consistency:: Thin  aspirin 325 mg orally every day and clopidogrel 75 mg orally every day prior to admission, now on aspirin 325 mg orally every day and clopidogrel 75 mg orally every day  Patient counseled to be compliant with his antithrombotic medications  Ongoing aggressive stroke risk factor management  Therapy recommendations: Pending   Disposition: Pending  Hypertension  Stable - avoid hypotension  Permissive hypertension (OK if < 220/120) but gradually normalize in 5-7 days  Hyperlipidemia  Home meds:  Zocor 40 mg daily  resumed in hospital  LDL 60, goal < 70  Continue statin at discharge  Diabetes  HgbA1c 7.5, goal < 7.0  Uncontrolled  Other Stroke Risk Factors  Advanced age  Cigarette smoker, quit smoking 21 years ago.  Hx stroke/TIA  Coronary artery disease - prior inferior MI 10/2008 with DES to LCX   Other Active Problems  Acute on chronic kidney disease  Loop recorder placed 05/26/15 by Dr. Ladona Ridgel - interrogate -  will notify cardiology  Abdominal aortic aneurysm  Constipation - Dulcolax suppository ordered  Chronic back pain  Vascular surgical consult 05/29/2015 - Dr. Edilia Bo - left carotid endarterectomy had been planned in 2 wks.  Spoke with Dr. Darrick Penna. He will inform Dr. Edilia Bo of the patient's admission.  Hospital day #   Delton See  PA-C [facility] Pager [phone] 06/15/2015, 2:12 PM I have personally examined this patient, reviewed notes, independently viewed imaging studies, participated in medical decision making and plan of care. I have made any additions or clarifications directly to the above note. Agree with note above.  He presented with dizziness, blurred vision, gait ataxia and MRI shows new tint left caudate infarct. He has known proximal LICA stenosis which is symptomatic and he was being considered for elective left CEA which now needs to be done more emergently.He remains at risk for neurological worsening, recurrent strokes, TIAs and needs close neurological monitoring. I had a long d/w patient and son at bedside and answered questions.Continue aspirin and plavix for now and ask vascular surgery to consider left CEA prior to discharge. Delia Heady, MD Medical Director [facility] Pager: [phone] 06/15/2015 3:06 PM     To contact Stroke Continuity provider, please refer to [email or web address]. After hours, contact General Neurology

## 2015-06-15 NOTE — Progress Notes (Signed)
Patient awake, complaining of mild pain over bladder area, pain medicine given. Patient still deferring to eat dinner until later.

## 2015-06-16 DIAGNOSIS — I429 Cardiomyopathy, unspecified: Secondary | ICD-10-CM | POA: Diagnosis not present

## 2015-06-16 DIAGNOSIS — Z955 Presence of coronary angioplasty implant and graft: Secondary | ICD-10-CM | POA: Diagnosis not present

## 2015-06-16 DIAGNOSIS — H538 Other visual disturbances: Secondary | ICD-10-CM | POA: Diagnosis present

## 2015-06-16 DIAGNOSIS — Z79899 Other long term (current) drug therapy: Secondary | ICD-10-CM | POA: Diagnosis not present

## 2015-06-16 DIAGNOSIS — I252 Old myocardial infarction: Secondary | ICD-10-CM | POA: Diagnosis not present

## 2015-06-16 DIAGNOSIS — I714 Abdominal aortic aneurysm, without rupture: Secondary | ICD-10-CM | POA: Diagnosis present

## 2015-06-16 DIAGNOSIS — Z794 Long term (current) use of insulin: Secondary | ICD-10-CM

## 2015-06-16 DIAGNOSIS — Z981 Arthrodesis status: Secondary | ICD-10-CM | POA: Diagnosis not present

## 2015-06-16 DIAGNOSIS — Z7982 Long term (current) use of aspirin: Secondary | ICD-10-CM | POA: Diagnosis not present

## 2015-06-16 DIAGNOSIS — E041 Nontoxic single thyroid nodule: Secondary | ICD-10-CM | POA: Diagnosis present

## 2015-06-16 DIAGNOSIS — I6521 Occlusion and stenosis of right carotid artery: Secondary | ICD-10-CM | POA: Diagnosis present

## 2015-06-16 DIAGNOSIS — I452 Bifascicular block: Secondary | ICD-10-CM | POA: Diagnosis not present

## 2015-06-16 DIAGNOSIS — R262 Difficulty in walking, not elsewhere classified: Secondary | ICD-10-CM | POA: Diagnosis present

## 2015-06-16 DIAGNOSIS — Z79891 Long term (current) use of opiate analgesic: Secondary | ICD-10-CM | POA: Diagnosis not present

## 2015-06-16 DIAGNOSIS — Z7984 Long term (current) use of oral hypoglycemic drugs: Secondary | ICD-10-CM | POA: Diagnosis not present

## 2015-06-16 DIAGNOSIS — Z7902 Long term (current) use of antithrombotics/antiplatelets: Secondary | ICD-10-CM | POA: Diagnosis not present

## 2015-06-16 DIAGNOSIS — Z888 Allergy status to other drugs, medicaments and biological substances status: Secondary | ICD-10-CM | POA: Diagnosis not present

## 2015-06-16 DIAGNOSIS — M1990 Unspecified osteoarthritis, unspecified site: Secondary | ICD-10-CM | POA: Diagnosis present

## 2015-06-16 DIAGNOSIS — I639 Cerebral infarction, unspecified: Secondary | ICD-10-CM | POA: Diagnosis not present

## 2015-06-16 DIAGNOSIS — K5900 Constipation, unspecified: Secondary | ICD-10-CM | POA: Diagnosis present

## 2015-06-16 DIAGNOSIS — N182 Chronic kidney disease, stage 2 (mild): Secondary | ICD-10-CM | POA: Diagnosis not present

## 2015-06-16 DIAGNOSIS — E1122 Type 2 diabetes mellitus with diabetic chronic kidney disease: Secondary | ICD-10-CM | POA: Diagnosis present

## 2015-06-16 DIAGNOSIS — I6522 Occlusion and stenosis of left carotid artery: Secondary | ICD-10-CM | POA: Diagnosis not present

## 2015-06-16 DIAGNOSIS — R339 Retention of urine, unspecified: Secondary | ICD-10-CM | POA: Diagnosis not present

## 2015-06-16 DIAGNOSIS — I63312 Cerebral infarction due to thrombosis of left middle cerebral artery: Secondary | ICD-10-CM | POA: Diagnosis not present

## 2015-06-16 DIAGNOSIS — I255 Ischemic cardiomyopathy: Secondary | ICD-10-CM | POA: Diagnosis present

## 2015-06-16 DIAGNOSIS — I69322 Dysarthria following cerebral infarction: Secondary | ICD-10-CM | POA: Diagnosis not present

## 2015-06-16 DIAGNOSIS — I63232 Cerebral infarction due to unspecified occlusion or stenosis of left carotid arteries: Secondary | ICD-10-CM | POA: Diagnosis present

## 2015-06-16 DIAGNOSIS — E785 Hyperlipidemia, unspecified: Secondary | ICD-10-CM | POA: Diagnosis present

## 2015-06-16 DIAGNOSIS — R42 Dizziness and giddiness: Secondary | ICD-10-CM | POA: Diagnosis present

## 2015-06-16 DIAGNOSIS — I69320 Aphasia following cerebral infarction: Secondary | ICD-10-CM | POA: Diagnosis not present

## 2015-06-16 DIAGNOSIS — Z8249 Family history of ischemic heart disease and other diseases of the circulatory system: Secondary | ICD-10-CM | POA: Diagnosis not present

## 2015-06-16 DIAGNOSIS — Z95818 Presence of other cardiac implants and grafts: Secondary | ICD-10-CM | POA: Diagnosis not present

## 2015-06-16 DIAGNOSIS — E1151 Type 2 diabetes mellitus with diabetic peripheral angiopathy without gangrene: Secondary | ICD-10-CM | POA: Diagnosis present

## 2015-06-16 DIAGNOSIS — R1310 Dysphagia, unspecified: Secondary | ICD-10-CM | POA: Diagnosis present

## 2015-06-16 DIAGNOSIS — I69351 Hemiplegia and hemiparesis following cerebral infarction affecting right dominant side: Secondary | ICD-10-CM | POA: Diagnosis not present

## 2015-06-16 DIAGNOSIS — H5442 Blindness, left eye, normal vision right eye: Secondary | ICD-10-CM | POA: Diagnosis present

## 2015-06-16 DIAGNOSIS — I951 Orthostatic hypotension: Secondary | ICD-10-CM | POA: Diagnosis not present

## 2015-06-16 DIAGNOSIS — G894 Chronic pain syndrome: Secondary | ICD-10-CM | POA: Diagnosis present

## 2015-06-16 DIAGNOSIS — Z87891 Personal history of nicotine dependence: Secondary | ICD-10-CM | POA: Diagnosis not present

## 2015-06-16 DIAGNOSIS — G8191 Hemiplegia, unspecified affecting right dominant side: Secondary | ICD-10-CM | POA: Diagnosis not present

## 2015-06-16 DIAGNOSIS — I5022 Chronic systolic (congestive) heart failure: Secondary | ICD-10-CM | POA: Diagnosis present

## 2015-06-16 DIAGNOSIS — I2510 Atherosclerotic heart disease of native coronary artery without angina pectoris: Secondary | ICD-10-CM | POA: Diagnosis present

## 2015-06-16 DIAGNOSIS — M4806 Spinal stenosis, lumbar region: Secondary | ICD-10-CM | POA: Diagnosis present

## 2015-06-16 DIAGNOSIS — N183 Chronic kidney disease, stage 3 (moderate): Secondary | ICD-10-CM | POA: Diagnosis present

## 2015-06-16 LAB — GLUCOSE, CAPILLARY
GLUCOSE-CAPILLARY: 209 mg/dL — AB (ref 65–99)
GLUCOSE-CAPILLARY: 174 mg/dL — AB (ref 65–99)
GLUCOSE-CAPILLARY: 209 mg/dL — AB (ref 65–99)

## 2015-06-16 MED ORDER — GLUCERNA SHAKE PO LIQD
237.0000 mL | Freq: Every day | ORAL | Status: DC | PRN
  Administered 2015-06-16: 237 mL via ORAL
  Filled 2015-06-16 (×2): qty 237

## 2015-06-16 MED ORDER — CEFAZOLIN SODIUM 1-5 GM-% IV SOLN
1.0000 g | INTRAVENOUS | Status: DC
  Filled 2015-06-16: qty 50

## 2015-06-16 MED ORDER — SODIUM CHLORIDE 0.9 % IV BOLUS (SEPSIS)
500.0000 mL | Freq: Once | INTRAVENOUS | Status: AC
  Administered 2015-06-16: 500 mL via INTRAVENOUS

## 2015-06-16 MED ORDER — INSULIN GLARGINE 100 UNIT/ML ~~LOC~~ SOLN
24.0000 [IU] | Freq: Every day | SUBCUTANEOUS | Status: DC
  Administered 2015-06-16 – 2015-06-19 (×3): 24 [IU] via SUBCUTANEOUS
  Filled 2015-06-16 (×5): qty 0.24

## 2015-06-16 NOTE — Consult Note (Signed)
[facility]  Patient name: Lucas Morgan MRN: [id number] DOB: 1941/05/01 Sex: male  REASON FOR CONSULT: Symptomatic left carotid stenosis  HPI: Lucas Morgan is a 74 y.o. male who was admitted on 06/14/2015 with difficulty walking. I had seen the patient in consultation on 05/29/2015 with a symptomatic left carotid stenosis. At that time, he had a left brain stroke associated with significant right upper extremity weakness and expressive aphasia. His carotid duplex scan showed 60-79% left carotid stenosis. CT angiogram showed plaque in the left proximal internal carotid artery. MRI of the brain on 05/25/2015 showed acute/subacute nonhemorrhagic infarcts involving the left frontal lobe. In addition there were small non-confluent punctate areas of infarct within the anterior left frontal lobe left parietal lobe and left occipital lobe. Also they were probably take foci of acute nonhemorrhagic infarcts in the right parietal and occipital lobes. Given that he had significant disability and was undergoing rehabilitation I felt that he should recover some from his stroke performed considering left carotid endarterectomy planned on considering this and proximally 3 weeks. However he has been readmitted with difficulty walking.   Past Medical History  Diagnosis Date  . Diabetes mellitus   . Dyslipidemia   . MI (myocardial infarction) (HCC) 11/09/2008    2.5 x 23 Xience V DES to the CFX  . AAA (abdominal aortic aneurysm) (HCC)     a. Abd U/S 7/14: mild aneurysmal dilatation 3x3 cm; cholelithiasis without evid of cholecystitis => repeat 1 year  . Coronary artery disease   . Arthritis     stenosis, lumbar region  . Renal insufficiency   . Stroke [facility])     Family History  Problem Relation Age of Onset  . Heart attack Brother     x2 brothers  . CAD Brother     SOCIAL HISTORY: Social History  Substance Use Topics  . Smoking status: Former Smoker    Quit date:  09/13/1993  . Smokeless tobacco: Never Used  . Alcohol Use: No    Allergies  Allergen Reactions  . Trazodone And Nefazodone Other (See Comments)    High blood sugar    Current Facility-Administered Medications  Medication Dose Route Frequency Provider Last Rate Last Dose  . acetaminophen (TYLENOL) tablet 650 mg  650 mg Oral Q4H PRN Rolly Salter, MD   650 mg at 06/15/15 1610   Or  . acetaminophen (TYLENOL) suppository 650 mg  650 mg Rectal Q4H PRN Rolly Salter, MD      . ALPRAZolam Prudy Feeler) tablet 0.25 mg  0.25 mg Oral BID PRN Rolly Salter, MD   0.25 mg at 06/15/15 0935  . aspirin tablet 325 mg  325 mg Oral Daily Rolly Salter, MD   325 mg at 06/15/15 0935  . bisacodyl (DULCOLAX) suppository 10 mg  10 mg Rectal Daily PRN David L Rinehuls, PA-C      . clopidogrel (PLAVIX) tablet 75 mg  75 mg Oral Daily Rolly Salter, MD   75 mg at 06/15/15 0935  . enoxaparin (LOVENOX) injection 40 mg  40 mg Subcutaneous Q24H Rolly Salter, MD   40 mg at 06/15/15 0935  . HYDROcodone-acetaminophen (NORCO) 10-325 MG per tablet 1 tablet  1 tablet Oral Q6H PRN Calvert Cantor, MD   1 tablet at 06/16/15 0628  . insulin aspart (novoLOG) injection 0-15 Units  0-15 Units Subcutaneous TID WC Rolly Salter, MD   5 Units at 06/16/15 [phone]  . insulin  aspart (novoLOG) injection 0-5 Units  0-5 Units Subcutaneous QHS Rolly Salter, MD   3 Units at 06/15/15 2256  . insulin glargine (LANTUS) injection 30 Units  30 Units Subcutaneous QHS Calvert Cantor, MD   30 Units at 06/15/15 2308  . simvastatin (ZOCOR) tablet 40 mg  40 mg Oral QHS Rolly Salter, MD   40 mg at 06/15/15 2255  . tamsulosin (FLOMAX) capsule 0.4 mg  0.4 mg Oral Daily Calvert Cantor, MD   0.4 mg at 06/15/15 1028  . traMADol-acetaminophen (ULTRACET) 37.5-325 MG per tablet 1-2 tablet  1-2 tablet Oral Q8H PRN Rolly Salter, MD   2 tablet at 06/16/15 0411    REVIEW OF SYSTEMS: Because of his expressive aphasia I am unable to obtain a review of  systems.  PHYSICAL EXAM: Filed Vitals:   06/15/15 1729 06/15/15 2109 06/16/15 0109 06/16/15 0519  BP: 104/54 110/90 98/63 103/60  Pulse: 107 108 84 86  Temp: 97.7 F (36.5 C) 98.2 F (36.8 C) 98 F (36.7 C) 98.1 F (36.7 C)  TempSrc: Oral Oral Oral Oral  Resp: Height:      Weight:      SpO2: 95% 95% 96% 97%   Body mass index is 24.27 kg/(m^2). GENERAL: The patient is a well-nourished male, in no acute distress. The vital signs are documented above. CARDIAC: There is a regular rate and rhythm.  VASCULAR: I do not detect carotid bruits. He has palpable femoral, popliteal, and posterior tibial pulses bilaterally. PULMONARY: There is good air exchange bilaterally without wheezing or rales. ABDOMEN: Soft and non-tender with normal pitched bowel sounds.  MUSCULOSKELETAL: There are no major deformities. NEUROLOGIC: He has mild for our 5 biceps and triceps strength on the right. He has good strength in the lower extremities. He has an extensive expressive aphasia.SKIN: There are no ulcers or rashes noted. PSYCHIATRIC: The patient has a normal affect.  DATA:  Lab Results  Component Value Date   WBC 7.7 06/15/2015   HGB 15.4 06/15/2015   HCT 44.5 06/15/2015   MCV 90.1 06/15/2015   PLT 205 06/15/2015   Lab Results  Component Value Date   NA 134* 06/15/2015   K 4.1 06/15/2015   CL 100* 06/15/2015   CO2 26 06/15/2015   Lab Results  Component Value Date   CREATININE 1.20 06/15/2015   Lab Results  Component Value Date   INR 1.02 06/14/2015   INR 1.09 05/24/2015   INR 0.98 12/22/2009   Lab Results  Component Value Date   HGBA1C 7.5* 05/25/2015   CBG (last 3)   Recent Labs  06/15/15 1613 06/15/15 2137 06/16/15 0619  GLUCAP 225* 277* 209*     CT SCAN HEAD: CT scan on 06/14/2015 shows a subacute left frontal infarct. This was known from his previous admission.  MRI head: MRI of the head on 06/14/2015 shows a new tiny acute left caudate head  infarct.  CAROTID DUPLEX: His previous carotid duplex scan on 05/27/2015 shows a 60-79% left internal carotid artery stenosis with no significant stenosis on the right. The stenosis on the left is in the mid internal carotid artery.  MEDICAL ISSUES: SYMPTOMATIC LEFT CAROTID STENOSIS: Given his recurrent neurologic symptoms, I agree with Dr. Pearlean Brownie that we should proceed with left carotid endarterectomy in order to lower his risk of future stroke. I have scheduled this for tomorrow. I have reviewed the indications for carotid endarterectomy, that is to lower the risk of future  stroke. I have also reviewed the potential complications of surgery, including but not limited to: bleeding, stroke (perioperative risk 2-3%), MI, nerve injury of other unpredictable medical problems. All of the patients questions were answered and they are agreeable to proceed with surgery. He is on Plavix however I think we should proceed as I do not think would be safe to wait 5 days off of Plavix.    Waverly Ferrari [facility] [place] [facility]: [phone]

## 2015-06-16 NOTE — Progress Notes (Addendum)
[facility] Progress Note   Lucas Morgan  KVQ:[id number]  DOB: 1940/10/20  DOA: 06/14/2015 PCP: Abran Richard, PA-C  Brief narrative: Lucas Morgan is a 74 y.o. male with recent CVA and residual symptoms of expressive a phase he, coronary artery disease, diabetes mellitus, chronic kidney disease and dyslipidemia. The patient was discharged home on 10/27 after being admitted for above-mentioned CVA. Family noted that he is having some trouble with ambulating and has complaints of dizziness temporal arteritis  Therefore he was brought back to the hospital. CT of the head performed in the ER was negative. MRI of the brain revealed a new "tiny" acute left caudate head infarct and several subacute infarcts which demonstrated expected evolution. He developed urinary retention on 10/2 and required a foley. Neuro and Vasc Surgery have evaluated him and the plans are for him to undergo a CEA tomorrow.    Subjective: Evaluated the patient this morning. He continues to state that he is dizzy when he tries to walk. No other complaints.  Assessment/Plan: Principal Problem:   CVA (cerebral infarction)- Dizziness -New, small left caudate head infarct- has left carotid stenosis- see below -Has been evaluated by neurology who feels that his symptoms of "dizziness" and difficulty with gait are not related to the infarct -Obtained a PT eval including an assessment of vestibular function- Dix Hallpike negative- per PT eval, no dizziness when ambulating yesterday -No further stroke workup indicated at this time as he recently had a complete stroke workup including a TEE -Neuro recommendations are to continue aspirin and Plavix  Active Problems:  Left carotid artery stenosis - will under go CEA tomorrow  Orthostatic hypotension -Noted to have positive orthostatic vital signs and therefore have given him fluid boluses however orthostatic still quite positive -autonomic?  - per RN, he is not  symptomatic when BP drops on standing-  - hold Flomax (started for urinary retention)  Urinary retention - foley placed yesterday after 3 I and O caths - continue foley for now until after CEA tomorrow- can give voiding trial tomorrow or the following day  - will hold off on Flomax due to positive orthostatic vitals.     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  -sugars stable on 30 U of Lantus and sliding scale insulin - will cut back on Lantus tonight as he will be NPO for surgery tomorrow  Dyslipidemia -Continue statin  Thyroid nodule -Noted on imaging during last admission-TSH normal-outpatient follow-up recommended with ultrasound  Code Status:     Code Status Orders        Start     Ordered   06/14/15 2245  Full code   Continuous     06/14/15 2245     Family Communication: Disposition Plan: Follow up on PT eval DVT prophylaxis: Lovenox Consultants: Neurology  Antibiotics: Anti-infectives    Start     Dose/Rate Route Frequency Ordered Stop   06/17/15 0800  ceFAZolin (ANCEF) IVPB 1 g/50 mL premix    Comments:  Send with pt to OR   1 g 100 mL/hr over 30 Minutes Intravenous To [facility]Stay [facility] 06/16/15 0916 06/18/15 0800      Objective: Filed Weights   06/14/15 2213  Weight: 74.571 kg (164 lb 6.4 oz)    Intake/Output Summary (Last 24 hours) at 06/16/15 1459 Last data filed at 06/16/15 1458  Gross per 24 hour  Intake      0 ml  Output   1850 ml  Net  -1850 ml     Vitals Filed Vitals:   06/16/15 0109 06/16/15 0519 06/16/15 0930 06/16/15 1338  BP: 98/63 103/60 98/53   Pulse: 84 86 84 82  Temp: 98 F (36.7 C) 98.1 F (36.7 C) 97.9 F (36.6 C) 98 F (36.7 C)  TempSrc: Oral Oral Oral Oral  Resp: Height:      Weight:      SpO2: 96% 97% 96% 96%    Exam:  General:  Pt is alert, not in acute distress  HEENT: No icterus, No thrush, oral mucosa moist  Cardiovascular: regular rate and  rhythm, S1/S2 No murmur  Respiratory: clear to auscultation bilaterally   Abdomen: Soft, +Bowel sounds, non tender, non distended, no guarding  MSK: No LE edema, cyanosis or clubbing  Data Reviewed: Basic Metabolic Panel:  Recent Labs Lab 06/10/15 0535 06/10/15 1200 06/14/15 1707 06/14/15 1711 06/15/15 0625  NA  --  137 137 135 134*  K  --  4.2 4.2 4.3 4.1  CL  --  104 101 102 100*  CO2  --  25  --  24 26  GLUCOSE  --  214* 214* 211* 199*  BUN  --  21* CREATININE 1.30* 1.32* 1.20 1.22 1.20  CALCIUM  --  8.8*  --  9.3 9.2   Liver Function Tests:  Recent Labs Lab 06/14/15 1711 06/15/15 0625  AST 25 19  ALT 24 20  ALKPHOS 54 54  BILITOT 0.5 0.7  PROT 7.0 7.1  ALBUMIN 3.2* 3.1*   No results for input(s): LIPASE, AMYLASE in the last 168 hours. No results for input(s): AMMONIA in the last 168 hours. CBC:  Recent Labs Lab 06/14/15 1707 06/14/15 1711 06/15/15 0625  WBC  --  10.4 7.7  NEUTROABS  --  7.2  --   HGB 16.0 15.4 15.4  HCT 47.0 44.3 44.5  MCV  --  91.0 90.1  PLT  --  212 205   Cardiac Enzymes: No results for input(s): CKTOTAL, CKMB, CKMBINDEX, TROPONINI in the last 168 hours. BNP (last 3 results) No results for input(s): BNP in the last 8760 hours.  ProBNP (last 3 results) No results for input(s): PROBNP in the last 8760 hours.  CBG:  Recent Labs Lab 06/15/15 1148 06/15/15 1613 06/15/15 2137 06/16/15 0619 06/16/15 1157  GLUCAP 203* 225* 277* 209* 174*    No results found for this or any previous visit (from the past 240 hour(s)).   Studies: Ct Head Wo Contrast  06/14/2015   CLINICAL DATA:  Acute dizziness for 1 day. Recent diagnosis of left frontal infarct.  EXAM: CT HEAD WITHOUT CONTRAST  TECHNIQUE: Contiguous axial images were obtained from the base of the skull through the vertex without intravenous contrast.  COMPARISON:  05/24/2015 and prior exams.  FINDINGS: A subacute left frontal infarct is again identified with  expected evolutionary changes.  Mild chronic small-vessel white matter ischemic changes, mild generalized cerebral volume loss and remote left parietal infarct again noted.  No acute intracranial abnormalities are identified, including mass lesion or mass effect, hydrocephalus, extra-axial fluid collection, midline shift, hemorrhage, or acute infarction.  The visualized bony calvarium is unremarkable.  Mild deformity and calcification of the left globe again noted.  IMPRESSION: No evidence of acute intracranial abnormality.  Evolutionary changes of known subacute left frontal infarct.  Mild atrophy, chronic small-vessel white matter ischemic changes and remote left parietal  infarct.   Electronically Signed   By: Harmon Pier M.D.   On: 06/14/2015 19:11   Mr Shirlee Latch Wo Contrast  06/14/2015   CLINICAL DATA:  74 year old diabetic male with dyslipidemia and renal insufficiency complaining of dizziness since yesterday. Recent stroke. Subsequent encounter.  EXAM: MRI HEAD WITHOUT CONTRAST  MRA HEAD WITHOUT CONTRAST  TECHNIQUE: Multiplanar, multiecho pulse sequences of the brain and surrounding structures were obtained without intravenous contrast. Angiographic images of the head were obtained using MRA technique without contrast.  COMPARISON:  06/14/2015 head CT.  05/25/2015 MR.  FINDINGS: MRI HEAD FINDINGS  Exam is motion degraded.  Several subacute infarcts demonstrate expected evolution. Largest subacute infarct left frontal lobe (with mild laminar necrosis). Scattered small subacute infarcts noted bilaterally.  New tiny acute left caudate head infarct.  Remote small caudate infarct.  No intracranial hemorrhage.  Moderate small vessel disease type changes.  Global atrophy without hydrocephalus.  No intracranial mass lesion noted on this unenhanced exam.  Left phthisis bulbi.  Cervical medullary junction, pituitary region and pineal region unremarkable.  Polypoid opacification anterior aspect sphenoid sinus.  MRA  HEAD FINDINGS  Anterior circulation without medium or large size vessel significant stenosis or occlusion.  Middle cerebral artery branch vessel narrowing and irregularity bilaterally.  Right vertebral artery ends in a posterior inferior cerebellar artery distribution. The right posterior inferior cerebellar artery is narrowed/ irregular and poorly delineated.  No significant stenosis left vertebral artery or basilar artery.  Nonvisualized anterior inferior cerebellar arteries.  Mild irregularity of portions of the posterior cerebral artery bilaterally with narrowing most notable involving distal branches.  No aneurysm noted.  IMPRESSION: MRI HEAD  Exam is motion degraded.  New tiny acute left caudate head infarct.  Several subacute infarcts demonstrate expected evolution. Largest subacute infarct left frontal lobe (with mild laminar necrosis). Scattered small subacute infarcts noted bilaterally.  Remote small caudate infarct.  No intracranial hemorrhage.  Moderate small vessel disease type changes.  Global atrophy without hydrocephalus.  Polypoid opacification anterior aspect sphenoid sinus.  MRA HEAD FINDINGS  Anterior circulation without medium or large size vessel significant stenosis or occlusion.  Middle cerebral artery branch vessel narrowing and irregularity bilaterally.  Right vertebral artery ends in a posterior inferior cerebellar artery distribution. The right posterior inferior cerebellar artery is narrowed/ irregular and poorly delineated.  No significant stenosis left vertebral artery or basilar artery.  Nonvisualized anterior inferior cerebellar arteries.  Mild irregularity of portions of the posterior cerebral artery bilaterally with narrowing most notable involving distal branches.   Electronically Signed   By: Lacy Duverney M.D.   On: 06/14/2015 21:04   Mr Brain Wo Contrast  06/14/2015   CLINICAL DATA:  74 year old diabetic male with dyslipidemia and renal insufficiency complaining of dizziness  since yesterday. Recent stroke. Subsequent encounter.  EXAM: MRI HEAD WITHOUT CONTRAST  MRA HEAD WITHOUT CONTRAST  TECHNIQUE: Multiplanar, multiecho pulse sequences of the brain and surrounding structures were obtained without intravenous contrast. Angiographic images of the head were obtained using MRA technique without contrast.  COMPARISON:  06/14/2015 head CT.  05/25/2015 MR.  FINDINGS: MRI HEAD FINDINGS  Exam is motion degraded.  Several subacute infarcts demonstrate expected evolution. Largest subacute infarct left frontal lobe (with mild laminar necrosis). Scattered small subacute infarcts noted bilaterally.  New tiny acute left caudate head infarct.  Remote small caudate infarct.  No intracranial hemorrhage.  Moderate small vessel disease type changes.  Global atrophy without hydrocephalus.  No intracranial mass lesion noted on this unenhanced  exam.  Left phthisis bulbi.  Cervical medullary junction, pituitary region and pineal region unremarkable.  Polypoid opacification anterior aspect sphenoid sinus.  MRA HEAD FINDINGS  Anterior circulation without medium or large size vessel significant stenosis or occlusion.  Middle cerebral artery branch vessel narrowing and irregularity bilaterally.  Right vertebral artery ends in a posterior inferior cerebellar artery distribution. The right posterior inferior cerebellar artery is narrowed/ irregular and poorly delineated.  No significant stenosis left vertebral artery or basilar artery.  Nonvisualized anterior inferior cerebellar arteries.  Mild irregularity of portions of the posterior cerebral artery bilaterally with narrowing most notable involving distal branches.  No aneurysm noted.  IMPRESSION: MRI HEAD  Exam is motion degraded.  New tiny acute left caudate head infarct.  Several subacute infarcts demonstrate expected evolution. Largest subacute infarct left frontal lobe (with mild laminar necrosis). Scattered small subacute infarcts noted bilaterally.  Remote  small caudate infarct.  No intracranial hemorrhage.  Moderate small vessel disease type changes.  Global atrophy without hydrocephalus.  Polypoid opacification anterior aspect sphenoid sinus.  MRA HEAD FINDINGS  Anterior circulation without medium or large size vessel significant stenosis or occlusion.  Middle cerebral artery branch vessel narrowing and irregularity bilaterally.  Right vertebral artery ends in a posterior inferior cerebellar artery distribution. The right posterior inferior cerebellar artery is narrowed/ irregular and poorly delineated.  No significant stenosis left vertebral artery or basilar artery.  Nonvisualized anterior inferior cerebellar arteries.  Mild irregularity of portions of the posterior cerebral artery bilaterally with narrowing most notable involving distal branches.   Electronically Signed   By: Lacy Duverney M.D.   On: 06/14/2015 21:04    Scheduled Meds:  Scheduled Meds: . aspirin  325 mg Oral Daily  . [START ON 06/17/2015]  ceFAZolin (ANCEF) IV  1 g Intravenous To SS-Surg  . clopidogrel  75 mg Oral Daily  . enoxaparin (LOVENOX) injection  40 mg Subcutaneous Q24H  . insulin aspart  0-15 Units Subcutaneous TID WC  . insulin aspart  0-5 Units Subcutaneous QHS  . insulin glargine  30 Units Subcutaneous QHS  . simvastatin  40 mg Oral QHS   Continuous Infusions:   Time spent on care of this patient: 35 min   Jamaiyah Pyle, MD 06/16/2015, 2:59 PM  LOS: 0 days   [facility] Office  [phone] Pager - Text Page per [email or web address] If 7PM-7AM, please contact night-coverage [email or web address]

## 2015-06-16 NOTE — Progress Notes (Signed)
Rehab Admissions Coordinator Note:  Patient was screened by Trish Mage for appropriateness for an Inpatient Acute Rehab Consult.  At this time, we are recommending Inpatient Rehab consult.  Trish Mage 06/16/2015, 4:47 PM  I can be reached at [phone].

## 2015-06-16 NOTE — Progress Notes (Signed)
Inpatient Diabetes Program Recommendations  AACE/ADA: New Consensus Statement on Inpatient Glycemic Control (2015)  Target Ranges:  Prepandial:   less than 140 mg/dL      Peak postprandial:   less than 180 mg/dL (1-2 hours)      Critically ill patients:  140 - 180 mg/dL   Review of Glycemic Control  Diabetes history: DM 2 Outpatient Diabetes medications: Lantus 37 units QHS, Glipizide 5 mg Daily Current orders for Inpatient glycemic control: Lantus 30 units QHS, Novolog Moderate TID + HS scale  Inpatient Diabetes Program Recommendations: Insulin - Basal: Fasting glucose 209 this am. Patient takes 37 units of basal at home. Please consider increasing basal insulin to Lantus 35 units QHS.  Thanks,  Christena Deem RN, MSN, [facility] Inpatient Diabetes Coordinator Team Pager [phone] (8a-5p)

## 2015-06-16 NOTE — Progress Notes (Signed)
PT Cancellation Note  Patient Details Name: Lucas Morgan MRN: [id number] DOB: 03-13-1941   Cancelled Treatment:    Reason Eval/Treat Not Completed: Patient at procedure or test/unavailable. Pt awaiting cardiac stent placement. PT to return as able/when appropriate.   Marcene Brawn 06/16/2015, 10:51 AM   Lewis Shock, PT, DPT Pager #: [phone] Office #: [phone]

## 2015-06-16 NOTE — Progress Notes (Signed)
Initial Nutrition Assessment  INTERVENTION:  Provide Glucerna Shake po PRN, each supplement provides 220 kcal and 10 grams of protein Encourage PO intake  NUTRITION DIAGNOSIS:   Unintentional weight loss related to acute illness as evidenced by percent weight loss.   GOAL:   Patient will meet greater than or equal to 90% of their needs   MONITOR:   PO intake, Labs, Weight trends, Skin  REASON FOR ASSESSMENT:   Malnutrition Screening Tool    ASSESSMENT:   74 y.o. male with history of diabetes mellitus, hyperlipidemia, coronary artery disease with previous MI, abdominal aortic aneurysm, renal insufficiency, and recent stroke presenting with worsening speech and right hemiparesis. MRI reveals acute/subacute nonhemorrhagic infarct involving the left frontal lobe.  Weight history shows that patient has lost from 185 lbs to 164 lbs since August (12% wt loss). During previous admission pt reported good appetite and was eating 100% of meals. Pt states that he continues to eat well and that his family is bringing him additional snacks. He denies any nutritional needs at this time.  Labs reviewed; elevated glucose  Diet Order:  Diet heart healthy/carb modified Room service appropriate?: Yes; Fluid consistency:: Thin Diet NPO time specified Except for: Sips with Meds  Skin:  Reviewed, no issues  Last BM:  10/2  Height:   Ht Readings from Last 1 Encounters:  06/14/15  (1.753 m)    Weight:   Wt Readings from Last 1 Encounters:  06/14/15 164 lb 6.4 oz (74.571 kg)    Ideal Body Weight:  72.7 kg  BMI:  Body mass index is 24.27 kg/(m^2).  Estimated Nutritional Needs:   Kcal:  1800-2000  Protein:  90-100 grams  Fluid:  1.8-2 L/day  EDUCATION NEEDS:   No education needs identified at this time  Dorothea Ogle RD, LDN Inpatient Clinical Dietitian Pager: [phone] After Hours Pager: [phone]

## 2015-06-16 NOTE — Evaluation (Signed)
Speech Language Pathology Evaluation Patient Details Name: Lucas Morgan MRN: [id number] DOB: 29-Mar-1941 Today's Date: 06/16/2015 Time: 4098-1191 SLP Time Calculation (min) (ACUTE ONLY): 19 min  Problem List:  Patient Active Problem List   Diagnosis Date Noted  . Dyslipidemia 06/15/2015  . Cerebral infarction due to unspecified mechanism   . Chronic pain syndrome 06/11/2015  . Adjustment disorder with anxious mood 06/11/2015  . Cardiomyopathy (HCC)   . Aphasia S/P CVA 05/28/2015  . Right hemiparesis (HCC) 05/28/2015  . Stroke, acute, embolic (HCC) 05/27/2015  . Carotid stenosis   . Stroke (HCC) 05/24/2015  . 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 Acute on chronic renal failure (HCC) 05/24/2015  . CVA (cerebral infarction) 05/24/2015  . Leukocytosis 05/24/2015  . Foreign body in colon 04/08/2015  . Spinal stenosis of lumbar region 10/18/2014  . Lumbar stenosis with neurogenic claudication 10/18/2014  . Thyroid nodule 03/12/2013  . AAA (abdominal aortic aneurysm) (HCC) 03/12/2013  . PULMONARY NODULE 08/14/2010  . COUGH 06/30/2010  . HYPOTENSION 05/04/2010  . AODM 01/27/2009  . HYPERCHOLESTEROLEMIA  IIA 01/27/2009  . CAD, NATIVE VESSEL 12/26/2008   Past Medical History:  Past Medical History  Diagnosis Date  . Diabetes mellitus   . Dyslipidemia   . MI (myocardial infarction) (HCC) 11/09/2008    2.5 x 23 Xience V DES to the CFX  . AAA (abdominal aortic aneurysm) (HCC)     a. Abd U/S 7/14: mild aneurysmal dilatation 3x3 cm; cholelithiasis without evid of cholecystitis => repeat 1 year  . Coronary artery disease   . Arthritis     stenosis, lumbar region  . Renal insufficiency   . Stroke [facility])    Past Surgical History:  Past Surgical History  Procedure Laterality Date  . Knee surgery    . Coronary stent placement    . Back surgery  2015    lumbar fusion  . Joint replacement Left   . 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  Lumbar laminectomy/decompression microdiscectomy N/A 10/18/2014    Procedure: LUMBAR LAMINECTOMY/DECOMPRESSION MICRODISCECTOMYLUMBAR THREE-FOUR ;  Surgeon: Temple Pacini, MD;  Location: [facility] NEURO ORS;  Service: Neurosurgery;  Laterality: N/A;  . Coronary angioplasty    . Ep implantable device N/A 05/26/2015    Procedure: Loop Recorder Insertion;  Surgeon: Marinus Maw, MD;  Location: [facility] INVASIVE CV LAB;  Service: Cardiovascular;  Laterality: N/A;  . Tee without cardioversion N/A 05/26/2015    Procedure: TRANSESOPHAGEAL ECHOCARDIOGRAM (TEE);  Surgeon: Vesta Mixer, MD;  Location: [facility] ENDOSCOPY;  Service: Cardiovascular;  Laterality: N/A;   HPI:  74 y.o. male with history of diabetes mellitus, hyperlipidemia, coronary artery disease with previous MI, abdominal aortic aneurysm, renal insufficiency, and recent stroke presenting with worsening speech and right hemiparesis. MRI reveals acute/subacute nonhemorrhagic infarct involving the left frontal lobe to level of precentral gyrus; smaller non confluent punctate areas of infarction within the more anterior left frontal lobe, the left parietal lobe, and left occipital lobe seen to follow a watershed distribution; At least 4 punctate foci of acute nonhemorrhagic infarct are noted within the right parietal and occipital lobe.  Recent CIR stay from 9/15-9/27 with signifcant improvements in communication upon D/C; pt was anticipating [facility] therapies post discharge.  Now with plan for left CEA 06/17/15.    Assessment / Plan / Recommendation Clinical Impression  Pt known to SLP services from September admission - he has made  excellent gains in communication during his stay on CIR.  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  Pt able to convey ideas with use of nouns/verbs but lacks grammatic structure.   CEA pending.  SLP will follow while in acute care - recommend resuming HH SLP upon D/C.      SLP  Assessment  Patient needs continued Speech Lanaguage Pathology Services    Follow Up Recommendations  Home health SLP    Frequency and Duration min 3x week  1 week   Pertinent Vitals/Pain     SLP Goals  Potential to Achieve Goals (ACUTE ONLY): Good  SLP Evaluation Prior Functioning  Cognitive/Linguistic Baseline: Baseline deficits Baseline deficit details: language, speech, and attention Type of Home: House  Lives With: Daughter Available Help at Discharge: Family;Available 24 hours/day Vocation: Retired   IT consultant  Overall Cognitive Status: History of cognitive impairments - at baseline Arousal/Alertness: Awake/alert Orientation Level: Oriented X4 Attention: Selective Sustained Attention: Appears intact Selective Attention: Impaired Selective Attention Impairment: Verbal basic    Comprehension  Auditory Comprehension Overall Auditory Comprehension: Impaired Yes/No Questions: Impaired Basic Immediate Environment Questions: 75-100% accurate Complex Questions: 75-100% accurate Commands: Impaired Two Step Basic Commands: 75-100% accurate Conversation: Simple Reading Comprehension Reading Status: Not tested    Expression Expression Primary Mode of Expression: Verbal Verbal Expression Overall Verbal Expression: Impaired at baseline Initiation: No impairment Level of Generative/Spontaneous Verbalization: Word;Phrase Repetition: Impaired Level of Impairment: Phrase level;Sentence level Naming: Impairment Responsive: 76-100% accurate Confrontation: Impaired Convergent: Not tested Divergent: Not tested Pragmatics: No impairment Written Expression Dominant Hand: Right   Oral / Motor Motor Speech Overall Motor Speech: Impaired Articulation: Impaired Level of Impairment: Phrase   GO    Billie Trager L. Samson Frederic, [place] CCC/SLP Pager [phone]  Blenda Mounts Laurice 06/16/2015, 11:23 AM

## 2015-06-16 NOTE — Progress Notes (Signed)
STROKE TEAM PROGRESS NOTE   SUBJECTIVE (INTERVAL HISTORY) Son at  Bedside. Per pt, plans for CEA tomorrow with [facility].   OBJECTIVE Temp:  [97.6 F (36.4 C)-98.2 F (36.8 C)] 97.9 F (36.6 C) (10/03 0930) Pulse Rate:  [84-116] 84 (10/03 0930) Cardiac Rhythm:  [-] Normal sinus rhythm (10/03 0830) Resp:  [16-20] 16 (10/03 0930) BP: (98-178)/(53-124) 98/53 mmHg (10/03 0930) SpO2:  [95 %-97 %] 96 % (10/03 0930)  CBC:   Recent Labs Lab 06/14/15 1711 06/15/15 0625  WBC 10.4 7.7  NEUTROABS 7.2  --   HGB 15.4 15.4  HCT 44.3 44.5  MCV 91.0 90.1  PLT 212 205   Basic Metabolic Panel:   Recent Labs Lab 06/14/15 1711 06/15/15 0625  NA 135 134*  K 4.3 4.1  CL 102 100*  CO2 24 26  GLUCOSE 211* 199*  BUN 15 12  CREATININE 1.22 1.20  CALCIUM 9.3 9.2   Lipid Panel:     Component Value Date/Time   CHOL 119 05/25/2015 0705   TRIG 129 05/25/2015 0705   HDL 33* 05/25/2015 0705   CHOLHDL 3.6 05/25/2015 0705   VLDL 26 05/25/2015 0705   LDLCALC 60 05/25/2015 0705   HgbA1c:  Lab Results  Component Value Date   HGBA1C 7.5* 05/25/2015   Urine Drug Screen: No results found for: LABOPIA, COCAINSCRNUR, LABBENZ, AMPHETMU, THCU, LABBARB    IMAGING  Ct Head Wo Contrast 06/14/2015    No evidence of acute intracranial abnormality.  Evolutionary changes of known subacute left frontal infarct.  Mild atrophy, chronic small-vessel white matter ischemic changes and remote left parietal infarct.     MRI HEAD   06/14/2015    Exam is motion degraded.  New tiny acute left caudate head infarct.  Several subacute infarcts demonstrate expected evolution. Largest subacute infarct left frontal lobe (with mild laminar necrosis). Scattered small subacute infarcts noted bilaterally.  Remote small caudate infarct.  No intracranial hemorrhage.  Moderate small vessel disease type changes.  Global atrophy without hydrocephalus.  Polypoid opacification anterior aspect sphenoid sinus.    MRA HEAD  06/14/2015     Anterior circulation without medium or large size vessel significant stenosis or occlusion.  Middle cerebral artery branch vessel narrowing and irregularity bilaterally.  Right vertebral artery ends in a posterior inferior cerebellar artery distribution. The right posterior inferior cerebellar artery is narrowed/ irregular and poorly delineated.  No significant stenosis left vertebral artery or basilar artery.  Nonvisualized anterior inferior cerebellar arteries.  Mild irregularity of portions of the posterior cerebral artery bilaterally with narrowing most notable involving distal branches.    CTA Neck 05/28/2015 5 mm segment of 60% stenosis LEFT internal carotid artery associated with fibromuscular dysplasia versus atherosclerosis, and dissection flap extending to the central lumen. 50% stenosis RIGHT ICA origin. 17 mm LEFT thyroid nodule for which follow-up dedicated thyroid sonogram is recommended on a nonemergent basis.   PHYSICAL EXAM Pleasant elderly Caucasian male not in distress. . Afebrile. Head is nontraumatic. Neck is supple without bruit.    Cardiac exam no murmur or gallop. Lungs are clear to auscultation. Distal pulses are well felt. Neurologic Exam  Mental Status: Alert, oriented, thought content appropriate. Mild aphasia Able to follow simple commands without difficulty. Cranial Nerves: II: Discs not visualized; Visual fields grossly normal, pupils equal, round, reactive to light and accommodation III,IV, VI: ptosis not present, extra-ocular motions intact bilaterally V,VII: Slight right facial droop, facial light touch sensation normal bilaterally VIII: hearing normal bilaterally IX,X: gag reflex  present XI: bilateral shoulder shrug XII: midline tongue extension Motor: Right :Upper extremity 4/5Left: Upper extremity 5/5 Lower extremity 5/5Lower extremity  5/5 Tone and bulk:normal tone throughout; no atrophy noted Sensory: Pinprick and light touch intact throughout, bilaterally Cerebellar: Unable to test Gait: Not tested   ASSESSMENT/PLAN [name] is a 74 y.o. 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 He did not receive IV t-PA due to recent stroke and late presentation.  Stroke:  New dominant  Left brain caudate head infarct in setting of bilateral small scattered embolic infarcts earlier in the month, new infarct felt to be secondary to identified left internal carotid artery stenosis.  Resultant  Mild right hemiparesis  MRI  multiple bilateral scattered infarcts, more concentrated on the L  MRA  nonvisualized anterior inferior cerebellar arteries  Carotid Doppler 05/27/2015 -  Findings suggest 1-39% right internal carotid artery stenosis and 60-79% left internal carotid artery stenosis. The right vertebral artery exhibits an atypical antegrade waveform. which suggests distal stenosis likely.  TEE - 05/26/2015 - EF 30-35%. No cardiac source of emboli identified.  LDL 60  HgbA1c 05/25/2015 - 7.5  VTE prophylaxis Diet heart healthy/carb modified Room service appropriate?: Yes; Fluid consistency:: Thin Diet NPO time specified Except for: Sips with Meds  aspirin 325 mg orally every day and clopidogrel 75 mg orally every day prior to admission, now on aspirin 325 mg orally every day and clopidogrel 75 mg orally every day  L CEA planned by Edilia Bo in the am  Therapy recommendations: HH OT & PT   Disposition: Pending  Hypertension  Stable - avoid hypotension  Hyperlipidemia  Home meds:  Zocor 40 mg daily  resumed in hospital  LDL 60, goal < 70  Continue statin at discharge  Diabetes type  2  HgbA1c 7.5, goal < 7.0  Uncontrolled  Other Stroke Risk Factors  Advanced age  Cigarette smoker, quit smoking 21 years ago.  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 Ladona Ridgel - interrogate - follow up findings  Abdominal aortic aneurysm  Constipation - Dulcolax suppository prn  Thyroid nodule -Noted on imaging during last admission-TSH normal-outpatient follow-up recommended with ultrasound  Hospital day # 0   Rhoderick Moody [facility] See Amion for Pager information 06/16/2015 2:16 PM  I have personally examined this patient, reviewed notes, independently viewed imaging studies, participated in medical decision making and plan of care. I have made any additions or clarifications directly to the above note. Agree with note above.  Agree with plan for left CEA tomorrow  Delia Heady, MD Medical Director [facility] Pager: [phone] 06/16/2015 2:57 PM      To contact Stroke Continuity provider, please refer to [email or web address]. After hours, contact General Neurology

## 2015-06-16 NOTE — Progress Notes (Signed)
Physical Therapy Treatment Patient Details Name: Lucas Morgan MRN: [id number] DOB: 1940-10-24 Today's Date: 06/16/2015    History of Present Illness Lucas Morgan is a 74 y.o. 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 The patient was discharged home on 10/27 after being admitted for above-mentioned CVA. Family noted that he is having some trouble with ambulating and has complaints of dizziness and therefore he was brought back to the hospital. CT of the head performed in the ER was negative. MRI of the brain revealed a new "tiny" acute left caudate head infarct in several subacute infarcts which demonstrated expected evolution.    PT Comments    Pt known from previous admit. Pt now with increased impulsivity and decreased safety awareness. Pt assisted to [facility] but almost tripped over catheter due to inattention requiring minA to maintain stability. Pt shaky and requires use of RW with minA for safe ambulation at this time. BP 140/60. Pt denied dizziness. Recommend CIR upon d/c to achieve safe mod I level of function. Pt scheduled for cardiac stent placement 10/4. PT to reassess post surgery as able.  Follow Up Recommendations  CIR     Equipment Recommendations  Rolling walker with 5" wheels    Recommendations for Other Services Rehab consult     Precautions / Restrictions Precautions Precautions: Fall Precaution Comments: ex Restrictions Weight Bearing Restrictions: No    Mobility  Bed Mobility Overal bed mobility: Needs Assistance Bed Mobility: Supine to Sit     Supine to sit: Min assist Sit to supine: Min assist   General bed mobility comments: pt impulsive and quick to move, minA for safety  Transfers Overall transfer level: Needs assistance Equipment used: Rolling walker (2 wheeled) Transfers: Sit to/from Stand Sit to Stand: Min assist         General  transfer comment: pt impulsive, minA for safety, max v/c's for safe hand placement  Ambulation/Gait Ambulation/Gait assistance: Min assist Ambulation Distance (Feet): 75 Feet Assistive device: Rolling walker (2 wheeled) Gait Pattern/deviations: Step-through pattern     General Gait Details: pt very impulsive and quick to move, minA required for safety   Stairs            Wheelchair Mobility    Modified Rankin (Stroke Patients Only) Modified Rankin (Stroke Patients Only) Pre-Morbid Rankin Score: Moderately severe disability Modified Rankin: Moderately severe disability     Balance           Standing balance support: Bilateral upper extremity supported Standing balance-Leahy Scale: Poor                      Cognition Arousal/Alertness: Awake/alert Behavior During Therapy: Impulsive Overall Cognitive Status: Impaired/Different from baseline Area of Impairment: Safety/judgement;Awareness   Current Attention Level: Selective   Following Commands: Follows one step commands consistently Safety/Judgement: Decreased awareness of safety;Decreased awareness of deficits Awareness: Emergent Problem Solving: Requires verbal cues;Requires tactile cues General Comments: pt extremely impulsive, unaware of catheter, almost pulled it out    Exercises      General Comments        Pertinent Vitals/Pain Pain Assessment: No/denies pain    Home Living                      Prior Function            PT Goals (current goals can now be found in the care  plan section) Progress towards PT goals: Progressing toward goals    Frequency  Min 4X/week    PT Plan Current plan remains appropriate    Co-evaluation             End of Session Equipment Utilized During Treatment: Gait belt Activity Tolerance: Patient tolerated treatment well Patient left: in bed;with call bell/phone within reach;with bed alarm set     Time: 1191-4782 PT Time  Calculation (min) (ACUTE ONLY): 18 min  Charges:  $Gait Training: 8-22 mins                    G Codes:      Marcene Brawn 06/16/2015, 3:58 PM   Lewis Shock, PT, DPT Pager #: [phone] Office #: [phone]

## 2015-06-17 ENCOUNTER — Ambulatory Visit (INDEPENDENT_AMBULATORY_CARE_PROVIDER_SITE_OTHER): Payer: Medicare Other | Admitting: *Deleted

## 2015-06-17 ENCOUNTER — Encounter (HOSPITAL_COMMUNITY): Payer: Self-pay | Admitting: Certified Registered Nurse Anesthetist

## 2015-06-17 ENCOUNTER — Inpatient Hospital Stay (HOSPITAL_COMMUNITY): Payer: Medicare Other | Admitting: Anesthesiology

## 2015-06-17 ENCOUNTER — Telehealth: Payer: Self-pay | Admitting: Vascular Surgery

## 2015-06-17 ENCOUNTER — Encounter (HOSPITAL_COMMUNITY)
Admission: EM | Disposition: A | Discharge status: Skilled Nursing Facility | Payer: Self-pay | Source: Home / Self Care | Attending: Internal Medicine

## 2015-06-17 DIAGNOSIS — I639 Cerebral infarction, unspecified: Secondary | ICD-10-CM | POA: Diagnosis not present

## 2015-06-17 DIAGNOSIS — I63312 Cerebral infarction due to thrombosis of left middle cerebral artery: Secondary | ICD-10-CM

## 2015-06-17 DIAGNOSIS — N182 Chronic kidney disease, stage 2 (mild): Secondary | ICD-10-CM

## 2015-06-17 DIAGNOSIS — E785 Hyperlipidemia, unspecified: Secondary | ICD-10-CM

## 2015-06-17 DIAGNOSIS — E1122 Type 2 diabetes mellitus with diabetic chronic kidney disease: Secondary | ICD-10-CM

## 2015-06-17 DIAGNOSIS — I2510 Atherosclerotic heart disease of native coronary artery without angina pectoris: Secondary | ICD-10-CM

## 2015-06-17 DIAGNOSIS — I6522 Occlusion and stenosis of left carotid artery: Secondary | ICD-10-CM

## 2015-06-17 DIAGNOSIS — G8191 Hemiplegia, unspecified affecting right dominant side: Secondary | ICD-10-CM

## 2015-06-17 DIAGNOSIS — I69320 Aphasia following cerebral infarction: Secondary | ICD-10-CM

## 2015-06-17 HISTORY — PX: ENDARTERECTOMY: SHX5162

## 2015-06-17 LAB — GLUCOSE, CAPILLARY
Glucose-Capillary: 239 mg/dL — ABNORMAL HIGH (ref 65–99)
Glucose-Capillary: 163 mg/dL — ABNORMAL HIGH (ref 65–99)
Glucose-Capillary: 253 mg/dL — ABNORMAL HIGH (ref 65–99)
Glucose-Capillary: 83 mg/dL (ref 65–99)
Glucose-Capillary: 97 mg/dL (ref 65–99)
Glucose-Capillary: 201 mg/dL — ABNORMAL HIGH (ref 65–99)

## 2015-06-17 LAB — TYPE AND SCREEN
ABO/RH(D): O POS
Antibody Screen: NEGATIVE

## 2015-06-17 LAB — ABO/RH: ABO/RH(D): O POS

## 2015-06-17 SURGERY — ENDARTERECTOMY, CAROTID
Anesthesia: General | Site: Neck | Laterality: Left

## 2015-06-17 MED ORDER — ROCURONIUM BROMIDE 50 MG/5ML IV SOLN
INTRAVENOUS | Status: AC
  Filled 2015-06-17: qty 1

## 2015-06-17 MED ORDER — OXYCODONE HCL 5 MG PO TABS
5.0000 mg | ORAL_TABLET | Freq: Once | ORAL | Status: AC
  Administered 2015-06-17: 5 mg via ORAL

## 2015-06-17 MED ORDER — DEXTROSE 5 % IV SOLN
INTRAVENOUS | Status: AC
  Filled 2015-06-17: qty 1.5

## 2015-06-17 MED ORDER — MAGNESIUM SULFATE 2 GM/50ML IV SOLN: 2.0000 g | Freq: Every day | INTRAVENOUS | Status: DC | PRN

## 2015-06-17 MED ORDER — DOPAMINE-DEXTROSE 3.2-5 MG/ML-% IV SOLN
3.0000 ug/kg/min | INTRAVENOUS | Status: DC | PRN
  Administered 2015-06-17: 3.575 ug/kg/min via INTRAVENOUS
  Filled 2015-06-17: qty 250

## 2015-06-17 MED ORDER — SODIUM CHLORIDE 0.9 % IJ SOLN
INTRAMUSCULAR | Status: AC
  Filled 2015-06-17: qty 10

## 2015-06-17 MED ORDER — HYDRALAZINE HCL 20 MG/ML IJ SOLN: 5.0000 mg | INTRAMUSCULAR | Status: DC | PRN

## 2015-06-17 MED ORDER — ONDANSETRON HCL 4 MG/2ML IJ SOLN: 4.0000 mg | Freq: Four times a day (QID) | INTRAMUSCULAR | Status: DC | PRN

## 2015-06-17 MED ORDER — CEFAZOLIN SODIUM-DEXTROSE 2-3 GM-% IV SOLR
INTRAVENOUS | Status: AC
  Administered 2015-06-17: 2 g via INTRAVENOUS
  Filled 2015-06-17: qty 50

## 2015-06-17 MED ORDER — PHENOL 1.4 % MT LIQD: 1.0000 | OROMUCOSAL | Status: DC | PRN

## 2015-06-17 MED ORDER — SODIUM CHLORIDE 0.9 % IV SOLN: 500.0000 mL | Freq: Once | INTRAVENOUS | Status: DC | PRN

## 2015-06-17 MED ORDER — SODIUM CHLORIDE 0.9 % IV SOLN
INTRAVENOUS | Status: DC | PRN
  Administered 2015-06-17: 500 mL

## 2015-06-17 MED ORDER — LACTATED RINGERS IV SOLN
INTRAVENOUS | Status: DC | PRN
  Administered 2015-06-17 (×2): via INTRAVENOUS

## 2015-06-17 MED ORDER — EPHEDRINE SULFATE 50 MG/ML IJ SOLN
INTRAMUSCULAR | Status: AC
Start: 2015-06-17 — End: 2015-06-17
  Filled 2015-06-17: qty 1

## 2015-06-17 MED ORDER — PROMETHAZINE HCL 25 MG/ML IJ SOLN: 6.2500 mg | INTRAMUSCULAR | Status: DC | PRN

## 2015-06-17 MED ORDER — ARTIFICIAL TEARS OP OINT
TOPICAL_OINTMENT | OPHTHALMIC | Status: DC | PRN
  Administered 2015-06-17: 1 via OPHTHALMIC

## 2015-06-17 MED ORDER — LIDOCAINE-EPINEPHRINE (PF) 1 %-1:200000 IJ SOLN
INTRAMUSCULAR | Status: DC | PRN
  Administered 2015-06-17: 10 mL

## 2015-06-17 MED ORDER — LIDOCAINE-EPINEPHRINE (PF) 1 %-1:200000 IJ SOLN
INTRAMUSCULAR | Status: AC
  Filled 2015-06-17: qty 30

## 2015-06-17 MED ORDER — INSULIN ASPART 100 UNIT/ML ~~LOC~~ SOLN
SUBCUTANEOUS | Status: AC
  Filled 2015-06-17: qty 8

## 2015-06-17 MED ORDER — LABETALOL HCL 5 MG/ML IV SOLN: 10.0000 mg | INTRAVENOUS | Status: DC | PRN

## 2015-06-17 MED ORDER — DOCUSATE SODIUM 100 MG PO CAPS
100.0000 mg | ORAL_CAPSULE | Freq: Every day | ORAL | Status: DC
  Administered 2015-06-18 – 2015-06-19 (×2): 100 mg via ORAL
  Filled 2015-06-17 (×2): qty 1

## 2015-06-17 MED ORDER — EPHEDRINE SULFATE 50 MG/ML IJ SOLN
INTRAMUSCULAR | Status: DC | PRN
  Administered 2015-06-17 (×4): 10 mg via INTRAVENOUS

## 2015-06-17 MED ORDER — THROMBIN 20000 UNITS EX SOLR
CUTANEOUS | Status: AC
  Filled 2015-06-17: qty 20000

## 2015-06-17 MED ORDER — SUCCINYLCHOLINE CHLORIDE 20 MG/ML IJ SOLN
INTRAMUSCULAR | Status: AC
  Filled 2015-06-17: qty 1

## 2015-06-17 MED ORDER — PHENYLEPHRINE HCL 10 MG/ML IJ SOLN
10.0000 mg | INTRAVENOUS | Status: DC | PRN
  Administered 2015-06-17: 25 ug/min via INTRAVENOUS

## 2015-06-17 MED ORDER — PHENYLEPHRINE HCL 10 MG/ML IJ SOLN
INTRAMUSCULAR | Status: DC | PRN
  Administered 2015-06-17 (×2): 120 ug via INTRAVENOUS
  Administered 2015-06-17: 80 ug via INTRAVENOUS
  Administered 2015-06-17 (×2): 120 ug via INTRAVENOUS

## 2015-06-17 MED ORDER — POTASSIUM CHLORIDE CRYS ER 20 MEQ PO TBCR
20.0000 meq | EXTENDED_RELEASE_TABLET | Freq: Every day | ORAL | Status: DC | PRN
Start: 2015-06-17 — End: 2015-06-19

## 2015-06-17 MED ORDER — ALBUMIN HUMAN 5 % IV SOLN
INTRAVENOUS | Status: DC | PRN
  Administered 2015-06-17: 10:00:00 via INTRAVENOUS

## 2015-06-17 MED ORDER — PROPOFOL 10 MG/ML IV BOLUS
INTRAVENOUS | Status: AC
  Filled 2015-06-17: qty 20

## 2015-06-17 MED ORDER — FENTANYL CITRATE (PF) 250 MCG/5ML IJ SOLN
INTRAMUSCULAR | Status: AC
  Filled 2015-06-17: qty 5

## 2015-06-17 MED ORDER — DEXTRAN 40 IN SALINE 10-0.9 % IV SOLN
INTRAVENOUS | Status: DC | PRN
  Administered 2015-06-17: 500 mL

## 2015-06-17 MED ORDER — PROPOFOL 10 MG/ML IV BOLUS
INTRAVENOUS | Status: DC | PRN
  Administered 2015-06-17: 150 mg via INTRAVENOUS

## 2015-06-17 MED ORDER — DEXTROSE 5 % IV SOLN
1.5000 g | Freq: Two times a day (BID) | INTRAVENOUS | Status: AC
  Administered 2015-06-17 – 2015-06-18 (×2): 1.5 g via INTRAVENOUS
  Filled 2015-06-17 (×2): qty 1.5

## 2015-06-17 MED ORDER — MORPHINE SULFATE (PF) 2 MG/ML IV SOLN: 2.0000 mg | INTRAVENOUS | Status: DC | PRN

## 2015-06-17 MED ORDER — HYDROMORPHONE HCL 1 MG/ML IJ SOLN: 0.2500 mg | INTRAMUSCULAR | Status: DC | PRN

## 2015-06-17 MED ORDER — SUGAMMADEX SODIUM 200 MG/2ML IV SOLN
INTRAVENOUS | Status: DC | PRN
  Administered 2015-06-17: 150 mg via INTRAVENOUS

## 2015-06-17 MED ORDER — ESMOLOL HCL 10 MG/ML IV SOLN
INTRAVENOUS | Status: AC
  Filled 2015-06-17: qty 10

## 2015-06-17 MED ORDER — METOPROLOL TARTRATE 1 MG/ML IV SOLN: 2.0000 mg | INTRAVENOUS | Status: DC | PRN

## 2015-06-17 MED ORDER — LABETALOL HCL 5 MG/ML IV SOLN
INTRAVENOUS | Status: AC
  Filled 2015-06-17: qty 4

## 2015-06-17 MED ORDER — ONDANSETRON HCL 4 MG/2ML IJ SOLN
INTRAMUSCULAR | Status: AC
  Filled 2015-06-17: qty 2

## 2015-06-17 MED ORDER — FENTANYL CITRATE (PF) 100 MCG/2ML IJ SOLN
INTRAMUSCULAR | Status: DC | PRN
  Administered 2015-06-17: 25 ug via INTRAVENOUS
  Administered 2015-06-17: 50 ug via INTRAVENOUS

## 2015-06-17 MED ORDER — LIDOCAINE HCL (CARDIAC) 20 MG/ML IV SOLN
INTRAVENOUS | Status: AC
  Filled 2015-06-17: qty 5

## 2015-06-17 MED ORDER — OXYCODONE HCL 5 MG PO TABS
ORAL_TABLET | ORAL | Status: AC
  Filled 2015-06-17: qty 1

## 2015-06-17 MED ORDER — HEPARIN SODIUM (PORCINE) 1000 UNIT/ML IJ SOLN
INTRAMUSCULAR | Status: AC
  Filled 2015-06-17: qty 1

## 2015-06-17 MED ORDER — PROTAMINE SULFATE 10 MG/ML IV SOLN
INTRAVENOUS | Status: DC | PRN
  Administered 2015-06-17: 40 mg via INTRAVENOUS

## 2015-06-17 MED ORDER — SUGAMMADEX SODIUM 200 MG/2ML IV SOLN
INTRAVENOUS | Status: AC
  Filled 2015-06-17: qty 2

## 2015-06-17 MED ORDER — HEPARIN SODIUM (PORCINE) 1000 UNIT/ML IJ SOLN
INTRAMUSCULAR | Status: DC | PRN
  Administered 2015-06-17: 7000 [IU] via INTRAVENOUS

## 2015-06-17 MED ORDER — HYDROCODONE-ACETAMINOPHEN 10-325 MG PO TABS
1.0000 | ORAL_TABLET | ORAL | Status: DC | PRN
  Administered 2015-06-17 – 2015-06-19 (×6): 1 via ORAL
  Filled 2015-06-17 (×5): qty 1

## 2015-06-17 MED ORDER — ROCURONIUM BROMIDE 100 MG/10ML IV SOLN
INTRAVENOUS | Status: DC | PRN
  Administered 2015-06-17: 50 mg via INTRAVENOUS
  Administered 2015-06-17: 20 mg via INTRAVENOUS

## 2015-06-17 MED ORDER — ONDANSETRON HCL 4 MG/2ML IJ SOLN
INTRAMUSCULAR | Status: DC | PRN
Start: 2015-06-17 — End: 2015-06-17
  Administered 2015-06-17: 4 mg via INTRAVENOUS

## 2015-06-17 MED ORDER — DEXTRAN 40 IN SALINE 10-0.9 % IV SOLN
INTRAVENOUS | Status: AC
  Filled 2015-06-17: qty 500

## 2015-06-17 MED ORDER — OXYCODONE HCL 5 MG/5ML PO SOLN
ORAL | Status: AC
  Filled 2015-06-17: qty 5

## 2015-06-17 MED ORDER — LIDOCAINE HCL (CARDIAC) 20 MG/ML IV SOLN
INTRAVENOUS | Status: DC | PRN
  Administered 2015-06-17: 60 mg via INTRAVENOUS

## 2015-06-17 MED ORDER — GUAIFENESIN-DM 100-10 MG/5ML PO SYRP: 15.0000 mL | ORAL_SOLUTION | ORAL | Status: DC | PRN

## 2015-06-17 MED ORDER — PROTAMINE SULFATE 10 MG/ML IV SOLN
INTRAVENOUS | Status: AC
  Filled 2015-06-17: qty 5

## 2015-06-17 MED ORDER — PANTOPRAZOLE SODIUM 40 MG PO TBEC
40.0000 mg | DELAYED_RELEASE_TABLET | Freq: Every day | ORAL | Status: DC
  Administered 2015-06-18 – 2015-06-19 (×2): 40 mg via ORAL
  Filled 2015-06-17 (×2): qty 1

## 2015-06-17 MED ORDER — SODIUM CHLORIDE 0.9 % IV SOLN
Freq: Once | INTRAVENOUS | Status: DC
Start: 2015-06-17 — End: 2015-06-19

## 2015-06-17 MED ORDER — ALUM & MAG HYDROXIDE-SIMETH 200-200-20 MG/5ML PO SUSP: 15.0000 mL | ORAL | Status: DC | PRN

## 2015-06-17 MED ORDER — 0.9 % SODIUM CHLORIDE (POUR BTL) OPTIME
TOPICAL | Status: DC | PRN
  Administered 2015-06-17 (×2): 1000 mL

## 2015-06-17 MED ORDER — LIDOCAINE HCL (PF) 1 % IJ SOLN
INTRAMUSCULAR | Status: AC
  Filled 2015-06-17: qty 30

## 2015-06-17 MED ORDER — SODIUM CHLORIDE 0.9 % IV SOLN
INTRAVENOUS | Status: DC
  Administered 2015-06-17: 14:00:00 via INTRAVENOUS

## 2015-06-17 MED ORDER — MIDAZOLAM HCL 2 MG/2ML IJ SOLN
INTRAMUSCULAR | Status: AC
  Filled 2015-06-17: qty 4

## 2015-06-17 MED ORDER — ALBUTEROL SULFATE HFA 108 (90 BASE) MCG/ACT IN AERS
INHALATION_SPRAY | RESPIRATORY_TRACT | Status: DC | PRN
  Administered 2015-06-17: 2 via RESPIRATORY_TRACT

## 2015-06-17 SURGICAL SUPPLY — 43 items
BAG DECANTER FOR FLEXI CONT (MISCELLANEOUS) ×3 IMPLANT
CANISTER SUCTION 2500CC (MISCELLANEOUS) ×3 IMPLANT
CANNULA VESSEL 3MM 2 BLNT TIP (CANNULA) ×3 IMPLANT
CATH ROBINSON RED A/P 18FR (CATHETERS) ×3 IMPLANT
CLIP TI MEDIUM 24 (CLIP) ×3 IMPLANT
CLIP TI WIDE RED SMALL 24 (CLIP) ×3 IMPLANT
CRADLE DONUT ADULT HEAD (MISCELLANEOUS) ×3 IMPLANT
DRAIN CHANNEL 15F RND FF W/TCR (WOUND CARE) IMPLANT
ELECT REM PT RETURN 9FT ADLT (ELECTROSURGICAL) ×3
ELECTRODE REM PT RTRN 9FT ADLT (ELECTROSURGICAL) ×1 IMPLANT
EVACUATOR SILICONE 100CC (DRAIN) IMPLANT
GLOVE BIO SURGEON STRL SZ 6.5 (GLOVE) ×2 IMPLANT
GLOVE BIO SURGEON STRL SZ7.5 (GLOVE) ×3 IMPLANT
GLOVE BIO SURGEONS STRL SZ 6.5 (GLOVE) ×1
GLOVE BIOGEL PI IND STRL 6 (GLOVE) ×1 IMPLANT
GLOVE BIOGEL PI IND STRL 6.5 (GLOVE) ×1 IMPLANT
GLOVE BIOGEL PI IND STRL 8 (GLOVE) ×1 IMPLANT
GLOVE BIOGEL PI INDICATOR 6 (GLOVE) ×2
GLOVE BIOGEL PI INDICATOR 6.5 (GLOVE) ×2
GLOVE BIOGEL PI INDICATOR 8 (GLOVE) ×2
GOWN STRL REUS W/ TWL LRG LVL3 (GOWN DISPOSABLE) ×3 IMPLANT
GOWN STRL REUS W/TWL LRG LVL3 (GOWN DISPOSABLE) ×6
KIT BASIN OR (CUSTOM PROCEDURE TRAY) ×3 IMPLANT
KIT ROOM TURNOVER OR (KITS) ×3 IMPLANT
LIQUID BAND (GAUZE/BANDAGES/DRESSINGS) ×3 IMPLANT
NEEDLE HYPO 25X1 1.5 SAFETY (NEEDLE) ×3 IMPLANT
NS IRRIG 1000ML POUR BTL (IV SOLUTION) ×6 IMPLANT
PACK CAROTID (CUSTOM PROCEDURE TRAY) ×3 IMPLANT
PAD ARMBOARD 7.5X6 YLW CONV (MISCELLANEOUS) ×6 IMPLANT
PATCH VASC XENOSURE 1CMX6CM (Vascular Products) ×2 IMPLANT
PATCH VASC XENOSURE 1X6 (Vascular Products) ×1 IMPLANT
SHUNT CAROTID BYPASS 10 (VASCULAR PRODUCTS) ×3 IMPLANT
SHUNT CAROTID BYPASS 12FRX15.5 (VASCULAR PRODUCTS) IMPLANT
SPONGE INTESTINAL PEANUT (DISPOSABLE) ×3 IMPLANT
SPONGE SURGIFOAM ABS GEL 100 (HEMOSTASIS) IMPLANT
SUT PROLENE 6 0 BV (SUTURE) ×3 IMPLANT
SUT PROLENE 7 0 BV 1 (SUTURE) IMPLANT
SUT SILK 2 0 FS (SUTURE) IMPLANT
SUT VIC AB 3-0 SH 27 (SUTURE) ×2
SUT VIC AB 3-0 SH 27X BRD (SUTURE) ×1 IMPLANT
SUT VICRYL 4-0 PS2 18IN ABS (SUTURE) ×3 IMPLANT
SYR CONTROL 10ML LL (SYRINGE) ×3 IMPLANT
WATER STERILE IRR 1000ML POUR (IV SOLUTION) ×3 IMPLANT

## 2015-06-17 NOTE — Progress Notes (Signed)
Tol.  po Sprite well. Does not want food/tray at this time.

## 2015-06-17 NOTE — Telephone Encounter (Signed)
LM for pt re appt, dpm °

## 2015-06-17 NOTE — Progress Notes (Signed)
500 cc fluid bolus for low BP, art. Line 78-88 systolic, cuff 70/41, SR 70's

## 2015-06-17 NOTE — Interval H&P Note (Signed)
History and Physical Interval Note:  06/17/2015 7:20 AM  Lucas Morgan  has presented today for surgery, with the diagnosis of Left carotid endarterectomy I65.22  The various methods of treatment have been discussed with the patient and family. After consideration of risks, benefits and other options for treatment, the patient has consented to  Procedure(s): ENDARTERECTOMY CAROTID (Left) as a surgical intervention .  The patient's history has been reviewed, patient examined, no change in status, stable for surgery.  I have reviewed the patient's chart and labs.  Questions were answered to the patient's satisfaction.     Waverly Ferrari

## 2015-06-17 NOTE — Progress Notes (Signed)
PT Cancellation Note  Patient Details Name: Lucas Morgan MRN: [id number] DOB: Jan 19, 1941   Cancelled Treatment:    Reason Eval/Treat Not Completed: Patient at procedure or test/unavailable. Pt undergoing endareterectomy. PT to return as appropriate.   Marcene Brawn 06/17/2015, 12:45 PM   Lewis Shock, PT, DPT Pager #: [phone] Office #: [phone]

## 2015-06-17 NOTE — Anesthesia Procedure Notes (Signed)
Procedure Name: Intubation Date/Time: 06/17/2015 7:56 AM Performed by: Roney Mans P Pre-anesthesia Checklist: Patient identified, Emergency Drugs available, Suction available, Patient being monitored and Timeout performed Patient Re-evaluated:Patient Re-evaluated prior to inductionOxygen Delivery Method: Circle system utilized Preoxygenation: Pre-oxygenation with 100% oxygen Intubation Type: IV induction Ventilation: Oral airway inserted - appropriate to patient size Laryngoscope Size: Mac and 4 Grade View: Grade I Tube type: Oral Tube size: 8.0 mm Number of attempts: 1 Placement Confirmation: ETT inserted through vocal cords under direct vision,  positive ETCO2 and breath sounds checked- equal and bilateral Secured at: 23 cm Tube secured with: Tape Dental Injury: Teeth and Oropharynx as per pre-operative assessment  Comments: Performed by Cristela Felt, SRNA

## 2015-06-17 NOTE — Telephone Encounter (Signed)
-----   Message from Sharee Pimple, RN sent at 06/17/2015 10:23 AM EDT ----- Regarding: Schedule   ----- Message -----    From: Chuck Hint, MD    Sent: 06/17/2015  10:12 AM      To: [facility] Charge Pool Subject: charge and f/u                                 PROCEDURE: Left carotid endarterectomy with bovine pericardial patch angioplasty  SURGEON: Di Kindle. Edilia Bo, MD, FACS  ASSIST: Karsten Ro, [facility]  He will need a follow up visit in 3 weeks. Thank you. CD

## 2015-06-17 NOTE — Transfer of Care (Signed)
Immediate Anesthesia Transfer of Care Note  Patient: Lucas Morgan  Procedure(s) Performed: Procedure(s): Left Carotid ENDARTERECTOMY with Xenosure Patch (Left)  Patient Location: PACU  Anesthesia Type:General  Level of Consciousness: awake, patient cooperative and lethargic  Airway & Oxygen Therapy: Patient Spontanous Breathing and Patient connected to face mask oxygen  Post-op Assessment: Report given to RN, Post -op Vital signs reviewed and stable and Patient moving all extremities X 4  Post vital signs: Reviewed and stable  Last Vitals:  BP 117/68 per Aline HR 80 SpO2 98 on 2L O2 via FM RR 10  Complications: No apparent anesthesia complications

## 2015-06-17 NOTE — Anesthesia Postprocedure Evaluation (Signed)
  Anesthesia Post-op Note  Patient: Lucas Morgan  Procedure(s) Performed: Procedure(s) (LRB): Left Carotid ENDARTERECTOMY with Xenosure Patch (Left)  Patient Location: PACU  Anesthesia Type: General  Level of Consciousness: awake and alert   Airway and Oxygen Therapy: Patient Spontanous Breathing  Post-op Pain: mild  Post-op Assessment: Post-op Vital signs reviewed, Patient's Cardiovascular Status Stable, Respiratory Function Stable, Patent Airway and No signs of Nausea or vomiting  Last Vitals:  Filed Vitals:   06/17/15 1030  BP: 69/40  Pulse: 77  Temp:   Resp: 17    Post-op Vital Signs: stable   Complications: No apparent anesthesia complications

## 2015-06-17 NOTE — Op Note (Signed)
    NAME: Lucas Morgan   MRN: [id number] DOB: 1941-01-20    DATE OF OPERATION: 06/17/2015  PREOP DIAGNOSIS: Symptomatic left carotid stenosis  POSTOP DIAGNOSIS: Same  PROCEDURE: Left carotid endarterectomy with bovine pericardial patch angioplasty  SURGEON: Di Kindle. Edilia Bo, MD, FACS  ASSIST: Karsten Ro, [facility]  ANESTHESIA: Gen.   EBL: minimal  INDICATIONS: Lucas Morgan is a 74 y.o. male he had a left hemispheric stroke and had a 60-79% left carotid stenosis. This was felt to be symptomatic. Given significant expressive aphasia and right-sided weakness we were trying to wait several weeks before surgery that he had recurrent symptoms and therefore we elected to proceed with semiurgent.  FINDINGS: the patient had a hemorrhagic plaque. It was a 60% stenosis.  TECHNIQUE: The patient was taken to the operating room and received a general anesthetic. Arterial line had been placed by anesthesia. The neck and upper chest were prepped and draped in the usual sterile fashion. An incision was made along the anterior border of the sternocleidomastoid and the dissection carried down to the common carotid artery which was dissected free and controlled with Rummel tourniquet. The facial vein was divided between 2-0 silk ties. The patient had a low bifurcation. The internal carotid artery was controlled approximately 5 cm above the bifurcation as I was concerned about possibly some clot present based on her previous CT scan. The external carotid artery and superior thyroid arteries were controlled. The patient had been heparinized. Clamps were then placed on the internal then the external and the common carotid artery. A longitudinal arteriotomy was made in the common carotid artery and this was extended through the plaque into the internal carotid artery. There was no clot present in the internal carotid artery. There was excellent backbleeding.A 10 shunt was placed into the internal carotid artery,  backbled and then placed into the common carotid artery and secured with Rummel tourniquet. Flow she establishes shunt. An endarterectomy plane was established proximally and the plaque was sharply divided. Eversion endarterectomy was performed of the external carotid artery. Distally there was nice tapering the plaque. No tacking sutures were required. The artery was irrigated with copious amount of heparin and dextran and all loose debris removed. A bovine pericardial patch had been soaked and cut to appropriate size. This was sewn using a continuous 6-0 proline suture. Prior to completing the patch closure, the shunt was removed. The arteries were backbled and flushed appropriately and the anastomosis completed. Flow was reestablished first to the external carotid artery and into the internal carotid artery. At the completion was an excellent Doppler signal distal to the patch. It was good diastolic flow. The heparin was partially reversed with protamine. The deep layer was closed with running 3-0 Vicryl. The platysma was closed with running 3-0 Vicryl. Skin was closed a 4-0 Vicryl suture. Liquid band was applied. The patient tolerated procedure well and awoke neurologically at his baseline. All needle and sponge counts were correct.  Waverly Ferrari, MD, FACS [facility]  DATE OF DICTATION:   06/17/2015

## 2015-06-17 NOTE — Anesthesia Preprocedure Evaluation (Addendum)
Anesthesia Evaluation  Patient identified by MRN, date of birth, ID band Patient awake    Reviewed: Allergy & Precautions, NPO status , Patient's Chart, lab work & pertinent test results  Airway Mallampati: II  TM Distance: >3 FB Neck ROM: Full  Mouth opening: Limited Mouth Opening  Dental no notable dental hx. (+) Edentulous Upper, Edentulous Lower   Pulmonary neg pulmonary ROS, former smoker,    Pulmonary exam normal breath sounds clear to auscultation       Cardiovascular + CAD, + Past MI, + Cardiac Stents and + Peripheral Vascular Disease  Normal cardiovascular exam Rhythm:Regular Rate:Normal     Neuro/Psych negative neurological ROS  negative psych ROS   GI/Hepatic negative GI ROS, Neg liver ROS,   Endo/Other  diabetes  Renal/GU negative Renal ROS  negative genitourinary   Musculoskeletal negative musculoskeletal ROS (+)   Abdominal   Peds negative pediatric ROS (+)  Hematology negative hematology ROS (+)   Anesthesia Other Findings   Reproductive/Obstetrics negative OB ROS                            Anesthesia Physical Anesthesia Plan  ASA: III  Anesthesia Plan: General   Post-op Pain Management:    Induction: Intravenous  Airway Management Planned: Oral ETT  Additional Equipment: Arterial line  Intra-op Plan:   Post-operative Plan: Extubation in OR  Informed Consent: I have reviewed the patients History and Physical, chart, labs and discussed the procedure including the risks, benefits and alternatives for the proposed anesthesia with the patient or authorized representative who has indicated his/her understanding and acceptance.   Dental advisory given  Plan Discussed with: CRNA and Surgeon  Anesthesia Plan Comments:         Anesthesia Quick Evaluation

## 2015-06-17 NOTE — Progress Notes (Signed)
Upper denture ret to pt and he put it in.

## 2015-06-17 NOTE — Consult Note (Signed)
Physical Medicine and Rehabilitation Consult Reason for Consult: Left caudate head infarct with several subacute infarcts Referring Physician: [facility]   HPI: Lucas Morgan is a 74 y.o. 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 Presented 06/14/2015 with acute onset of dizziness and decreased mobility as well as blurred vision. MRI of the brain showed a new small acute left caudate head infarct as well as several subacute infarcts with expected evolution from prior tracings. Largest subacute infarct left frontal lobe scattered small subacute infarcts bilaterally. MRA of the head with atherosclerotic type changes. No significant stenosis or occlusion. Recent echo/TEE no evidence of thrombus or vegetation with ejection fraction 30-35%. Hospital course follow-up vascular surgery and underwent left carotid endarterectomy 06/17/2015 per Dr. Edilia Bo. Neurology follow-up patient currently remains on aspirin and Plavix therapy for CVA prophylaxis. Subcutaneous Lovenox for DVT prophylaxis. Physical therapy evaluations have been completed with recommendations of physical medicine rehabilitation consult.  Patient's speech, expressive language has improved since rehabilitation stay. Daughter at bedside, states that he was ambulating without assistance at home prior to the most recent stroke  Review of Systems  Constitutional: Negative for fever and chills.  HENT: Positive for hearing loss.   Eyes: Positive for blurred vision.       Left  eye blindness  Respiratory: Negative for cough and shortness of breath.   Cardiovascular: Positive for leg swelling. Negative for chest pain and palpitations.  Gastrointestinal: Positive for constipation. Negative for nausea and vomiting.  Genitourinary: Negative for dysuria and hematuria.  Musculoskeletal: Positive for myalgias, back pain and joint pain.  Skin: Negative for rash.  Neurological: Positive for dizziness and weakness. Negative for seizures, loss of consciousness and headaches.   Past Medical History  Diagnosis Date  . Diabetes mellitus   . Dyslipidemia   . MI (myocardial infarction) (HCC) 11/09/2008    2.5 x 23 Xience V DES to the CFX  . AAA (abdominal aortic aneurysm) (HCC)     a. Abd U/S 7/14: mild aneurysmal dilatation 3x3 cm; cholelithiasis without evid of cholecystitis => repeat 1 year  . Coronary artery disease   . Arthritis     stenosis, lumbar region  . Renal insufficiency   . Stroke [facility])    Past Surgical History  Procedure Laterality Date  . Knee surgery    . Coronary stent placement    . Back surgery  2015    lumbar fusion  . Joint replacement Left   . 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 Lumbar laminectomy/decompression microdiscectomy N/A 10/18/2014    Procedure: LUMBAR LAMINECTOMY/DECOMPRESSION MICRODISCECTOMYLUMBAR THREE-FOUR ;  Surgeon: Temple Pacini, MD;  Location: [facility] NEURO ORS;  Service: Neurosurgery;  Laterality: N/A;  . Coronary angioplasty    . Ep implantable device N/A 05/26/2015    Procedure: Loop Recorder Insertion;  Surgeon: Marinus Maw, MD;  Location: [facility] INVASIVE CV LAB;  Service: Cardiovascular;  Laterality: N/A;  . Tee without cardioversion N/A 05/26/2015    Procedure: TRANSESOPHAGEAL ECHOCARDIOGRAM (TEE);  Surgeon: Vesta Mixer, MD;  Location: [facility] ENDOSCOPY;  Service: Cardiovascular;  Laterality: N/A;   Family History  Problem Relation Age of Onset  . Heart attack Brother     x2 brothers  . CAD  Brother    Social History:  reports that he quit smoking about 21 years ago. He has never used smokeless tobacco. He reports that he does not drink alcohol or use illicit drugs. Allergies:  Allergies  Allergen Reactions  . Trazodone And Nefazodone Other (See Comments)    High blood sugar   Medications Prior to Admission  Medication Sig Dispense Refill  . ALPRAZolam (XANAX) 0.5 MG tablet Take 0.5 mg by mouth 2 (two) times daily.   0  . aspirin 325 MG tablet Take 1 tablet (325 mg total) by mouth daily. 30 tablet 0  . clopidogrel (PLAVIX) 75 MG tablet Take 1 tablet (75 mg total) by mouth daily. 30 tablet 1  . glipiZIDE (GLUCOTROL) 10 MG tablet Take 0.5 tablets (5 mg total) by mouth daily before breakfast. 15 tablet 1  . HYDROcodone-acetaminophen (NORCO) 10-325 MG tablet Take 1 tablet by mouth every 6 (six) hours as needed (pain). 15 tablet 0  . LANTUS SOLOSTAR 100 UNIT/ML Solostar Pen Inject 37 Units into the skin at bedtime. 15 mL 11  . Menthol-Methyl Salicylate (MUSCLE RUB) 10-15 % CREA Apply 1 application topically 2 (two) times daily as needed for muscle pain. 113 g 0  . mirtazapine (REMERON) 7.5 MG tablet Take 1 tablet (7.5 mg total) by mouth at bedtime. For depression/sleep 30 tablet 1  . senna-docusate (SENOKOT-S) 8.6-50 MG tablet Take 1 tablet by mouth 2 (two) times daily. 60 tablet 0  . simvastatin (ZOCOR) 40 MG tablet Take 40 mg by mouth at bedtime.  6    Home: Home Living Family/patient expects to be discharged to:: Private residence Living Arrangements: Children Available Help at Discharge: Family, Available 24 hours/day Type of Home: House Home Access: Level entry Home Layout: One level Bathroom Shower/Tub: Curtain, Engineer, manufacturing systems: Standard Bathroom Accessibility: Yes Home Equipment: [employer] - single point, Information systems manager, Environmental consultant - 2 wheels, Wheelchair - manual, Grab bars - tub/shower  Lives With: Daughter  Functional History: Prior Function Level of  Independence: Needs assistance Gait / Transfers Assistance Needed: ambulating at a supervision level without assistive device after leaving CIR ADL's / Homemaking Assistance Needed: Could dress self after leaving CIR, needed assist for bathing safety and all IADL. Communication / Swallowing Assistance Needed: aphasia Comments: pt did his own yard work prior to CVA Functional Status:  Mobility: Bed Mobility Overal bed mobility: Needs Assistance Bed Mobility: Supine to Sit Supine to sit: Min assist Sit to supine: Min assist General bed mobility comments: pt impulsive and quick to move, minA for safety Transfers Overall transfer level: Needs assistance Equipment used: Rolling walker (2 wheeled) Transfers: Sit to/from Stand Sit to Stand: Min assist General transfer comment: pt impulsive, minA for safety, max v/c's for safe hand placement Ambulation/Gait Ambulation/Gait assistance: Min assist Ambulation Distance (Feet): 75 Feet Assistive device: Rolling walker (2 wheeled) Gait Pattern/deviations: Step-through pattern General Gait Details: pt very impulsive and quick to move, minA required for safety Gait velocity interpretation: at or above normal speed for age/gender    ADL: ADL Overall ADL's : Needs assistance/impaired Eating/Feeding: Set up, Sitting Grooming: Wash/dry hands, Standing, Supervision/safety Upper Body Bathing: Supervision/ safety, Sitting Lower Body Bathing: Sit to/from stand, Min guard Upper Body Dressing : Set up, Sitting Lower Body Dressing: Sit to/from stand, Min guard Toilet Transfer: Min guard, Ambulation Toileting-  Clothing Manipulation and Hygiene: Min guard, Sit to/from stand Functional mobility during ADLs: Min guard, Rolling walker General ADL Comments: Drifts R when ambulating.  Cognition: Cognition Overall Cognitive Status: Impaired/Different from baseline Arousal/Alertness: Awake/alert Orientation Level: Oriented X4 Attention:  Selective Sustained Attention: Appears intact Selective Attention: Impaired Selective Attention Impairment: Verbal basic Cognition Arousal/Alertness: Awake/alert Behavior During Therapy: Impulsive Overall Cognitive Status: Impaired/Different from baseline Area of Impairment: Safety/judgement, Awareness Current Attention Level: Selective Following Commands: Follows one step commands consistently Safety/Judgement: Decreased awareness of safety, Decreased awareness of deficits Awareness: Emergent Problem Solving: Requires verbal cues, Requires tactile cues General Comments: pt extremely impulsive, unaware of catheter, almost pulled it out Difficult to assess due to: Impaired communication  Blood pressure 125/78, pulse 97, temperature 98.1 F (36.7 C), temperature source Oral, resp. rate 16, height  (1.753 m), weight 74.571 kg (164 lb 6.4 oz), SpO2 97 %. Physical Exam  HENT:  Head: Normocephalic.  Eyes: EOM are normal.  Neck: Normal range of motion. Neck supple.  Cardiovascular: Normal rate and regular rhythm.   Respiratory: Effort normal and breath sounds normal. No respiratory distress.  GI: Soft. Bowel sounds are normal. He exhibits no distension.  Neurological: He is alert.  Makes good eye contact with examiner. Patient with expressive aphasia. He did follow simple commands. He has knows appropriate but some inconsistencies.  Skin:  Left carotid site clean and dry  Motor strength is 4/5 in the right deltoid, biceps, triceps, grip 5/5 in the left deltoid, biceps, triceps, grip 4+/5 in the right hip flexor and extensor ankle flexor 5/5 and left hip flexor and knee extensor and flexor Sensation difficult to assess due to aphasia. He indicates that both upper extremities are equal to light touch  Results for orders placed or performed during the hospital encounter of 06/14/15 (from the past 24 hour(s))  Glucose, capillary     Status: Abnormal   Collection Time: 06/16/15  6:19  AM  Result Value Ref Range   Glucose-Capillary 209 (H) 65 - 99 mg/dL  Glucose, capillary     Status: Abnormal   Collection Time: 06/16/15 11:57 AM  Result Value Ref Range   Glucose-Capillary 174 (H) 65 - 99 mg/dL   Comment 1 Notify RN    Comment 2 Document in Chart   Glucose, capillary     Status: Abnormal   Collection Time: 06/16/15  4:41 PM  Result Value Ref Range   Glucose-Capillary 209 (H) 65 - 99 mg/dL   Comment 1 Notify RN    Comment 2 Document in Chart   Glucose, capillary     Status: Abnormal   Collection Time: 06/16/15 10:30 PM  Result Value Ref Range   Glucose-Capillary 239 (H) 65 - 99 mg/dL   Comment 1 Notify RN    Comment 2 Document in Chart    No results found.  Assessment/Plan: Diagnosis: Acute left caudate infarct with decline in mobility in a patient with left ICA stenosis status post subacute left MCA infarct. Postop day #1 status post left carotid endarterectomy 1. Does the need for close, 24 hr/day medical supervision in concert with the patient's rehab needs make it unreasonable for this patient to be served in a less intensive setting? Yes 2. Co-Morbidities requiring supervision/potential complications: Postoperative carotid endarterectomy, uncontrolled diabetes 3. Due to bladder management, bowel management, safety, skin/wound care, disease management, medication administration, pain management and patient education, does the patient require 24 hr/day rehab nursing? Yes 4. Does the patient require coordinated care of a  physician, rehab nurse, PT (1-2 hrs/day, 5 days/week), OT (1-2 hrs/day, 5 days/week) and SLP (00.5-1 hrs/day, 5 days/week) to address physical and functional deficits in the context of the above medical diagnosis(es)? Yes Addressing deficits in the following areas: balance, endurance, locomotion, strength, transferring, bowel/bladder control, bathing, dressing, feeding, grooming, toileting, cognition, speech and language 5. Can the patient actively  participate in an intensive therapy program of at least 3 hrs of therapy per day at least 5 days per week? Yes 6. The potential for patient to make measurable gains while on inpatient rehab is good 7. Anticipated functional outcomes upon discharge from inpatient rehab are supervision  with PT, supervision with OT, supervision with SLP. 8. Estimated rehab length of stay to reach the above functional goals is: 7 days 9. Does the patient have adequate social supports and living environment to accommodate these discharge functional goals? Yes 10. Anticipated D/C setting: Home 11. Anticipated post D/C treatments: HH therapy 12. Overall Rehab/Functional Prognosis: excellent  RECOMMENDATIONS: This patient's condition is appropriate for continued rehabilitative care in the following setting: CIR Patient has agreed to participate in recommended program. Yes Note that insurance prior authorization may be required for reimbursement for recommended care.  Comment: If patient progresses to supervision level with acute care PT/OT, may go home with home health    06/17/2015

## 2015-06-17 NOTE — H&P (View-Only) (Signed)
 [facility] [place]  Patient name: Lucas Morgan MRN: [id number] DOB: 03/31/1941 Sex: male  REASON FOR CONSULT: Symptomatic left carotid stenosis  HPI: Lucas Morgan is a 74 y.o. male who was admitted on 06/14/2015 with difficulty walking. I had seen the patient in consultation on 05/29/2015 with a symptomatic left carotid stenosis. At that time, he had a left brain stroke associated with significant right upper extremity weakness and expressive aphasia. His carotid duplex scan showed 60-79% left carotid stenosis. CT angiogram showed plaque in the left proximal internal carotid artery. MRI of the brain on 05/25/2015 showed acute/subacute nonhemorrhagic infarcts involving the left frontal lobe. In addition there were small non-confluent punctate areas of infarct within the anterior left frontal lobe left parietal lobe and left occipital lobe. Also they were probably take foci of acute nonhemorrhagic infarcts in the right parietal and occipital lobes. Given that he had significant disability and was undergoing rehabilitation I felt that he should recover some from his stroke performed considering left carotid endarterectomy planned on considering this and proximally 3 weeks. However he has been readmitted with difficulty walking.   Past Medical History  Diagnosis Date  . Diabetes mellitus   . Dyslipidemia   . MI (myocardial infarction) (HCC) 11/09/2008    2.5 x 23 Xience V DES to the CFX  . AAA (abdominal aortic aneurysm) (HCC)     a. Abd U/S 7/14: mild aneurysmal dilatation 3x3 cm; cholelithiasis without evid of cholecystitis => repeat 1 year  . Coronary artery disease   . Arthritis     stenosis, lumbar region  . Renal insufficiency   . Stroke (HCC)     Family History  Problem Relation Age of Onset  . Heart attack Brother     x2 brothers  . CAD Brother     SOCIAL HISTORY: Social History  Substance Use Topics  . Smoking status: Former Smoker    Quit date:  09/13/1993  . Smokeless tobacco: Never Used  . Alcohol Use: No    Allergies  Allergen Reactions  . Trazodone And Nefazodone Other (See Comments)    High blood sugar    Current Facility-Administered Medications  Medication Dose Route Frequency Provider Last Rate Last Dose  . acetaminophen (TYLENOL) tablet 650 mg  650 mg Oral Q4H PRN Pranav M Patel, MD   650 mg at 06/15/15 1610   Or  . acetaminophen (TYLENOL) suppository 650 mg  650 mg Rectal Q4H PRN Pranav M Patel, MD      . ALPRAZolam (XANAX) tablet 0.25 mg  0.25 mg Oral BID PRN Pranav M Patel, MD   0.25 mg at 06/15/15 0935  . aspirin tablet 325 mg  325 mg Oral Daily Pranav M Patel, MD   325 mg at 06/15/15 0935  . bisacodyl (DULCOLAX) suppository 10 mg  10 mg Rectal Daily PRN David L Rinehuls, PA-C      . clopidogrel (PLAVIX) tablet 75 mg  75 mg Oral Daily Pranav M Patel, MD   75 mg at 06/15/15 0935  . enoxaparin (LOVENOX) injection 40 mg  40 mg Subcutaneous Q24H Pranav M Patel, MD   40 mg at 06/15/15 0935  . HYDROcodone-acetaminophen (NORCO) 10-325 MG per tablet 1 tablet  1 tablet Oral Q6H PRN Saima Rizwan, MD   1 tablet at 06/16/15 0628  . insulin aspart (novoLOG) injection 0-15 Units  0-15 Units Subcutaneous TID WC Pranav M Patel, MD   5 Units at 06/16/15 0629  . insulin   aspart (novoLOG) injection 0-5 Units  0-5 Units Subcutaneous QHS Pranav M Patel, MD   3 Units at 06/15/15 2256  . insulin glargine (LANTUS) injection 30 Units  30 Units Subcutaneous QHS Saima Rizwan, MD   30 Units at 06/15/15 2308  . simvastatin (ZOCOR) tablet 40 mg  40 mg Oral QHS Pranav M Patel, MD   40 mg at 06/15/15 2255  . tamsulosin (FLOMAX) capsule 0.4 mg  0.4 mg Oral Daily Saima Rizwan, MD   0.4 mg at 06/15/15 1028  . traMADol-acetaminophen (ULTRACET) 37.5-325 MG per tablet 1-2 tablet  1-2 tablet Oral Q8H PRN Pranav M Patel, MD   2 tablet at 06/16/15 0411    REVIEW OF SYSTEMS: Because of his expressive aphasia I am unable to obtain a review of  systems.  PHYSICAL EXAM: Filed Vitals:   06/15/15 1729 06/15/15 2109 06/16/15 0109 06/16/15 0519  BP: 104/54 110/90 98/63 103/60  Pulse: 107 108 84 86  Temp: 97.7 F (36.5 C) 98.2 F (36.8 C) 98 F (36.7 C) 98.1 F (36.7 C)  TempSrc: Oral Oral Oral Oral  Resp: 20 18 16 18  Height:      Weight:      SpO2: 95% 95% 96% 97%   Body mass index is 24.27 kg/(m^2). GENERAL: The patient is a well-nourished male, in no acute distress. The vital signs are documented above. CARDIAC: There is a regular rate and rhythm.  VASCULAR: I do not detect carotid bruits. He has palpable femoral, popliteal, and posterior tibial pulses bilaterally. PULMONARY: There is good air exchange bilaterally without wheezing or rales. ABDOMEN: Soft and non-tender with normal pitched bowel sounds.  MUSCULOSKELETAL: There are no major deformities. NEUROLOGIC: He has mild for our 5 biceps and triceps strength on the right. He has good strength in the lower extremities. He has an extensive expressive aphasia.SKIN: There are no ulcers or rashes noted. PSYCHIATRIC: The patient has a normal affect.  DATA:  Lab Results  Component Value Date   WBC 7.7 06/15/2015   HGB 15.4 06/15/2015   HCT 44.5 06/15/2015   MCV 90.1 06/15/2015   PLT 205 06/15/2015   Lab Results  Component Value Date   NA 134* 06/15/2015   K 4.1 06/15/2015   CL 100* 06/15/2015   CO2 26 06/15/2015   Lab Results  Component Value Date   CREATININE 1.20 06/15/2015   Lab Results  Component Value Date   INR 1.02 06/14/2015   INR 1.09 05/24/2015   INR 0.98 12/22/2009   Lab Results  Component Value Date   HGBA1C 7.5* 05/25/2015   CBG (last 3)   Recent Labs  06/15/15 1613 06/15/15 2137 06/16/15 0619  GLUCAP 225* 277* 209*     CT SCAN HEAD: CT scan on 06/14/2015 shows a subacute left frontal infarct. This was known from his previous admission.  MRI head: MRI of the head on 06/14/2015 shows a new tiny acute left caudate head  infarct.  CAROTID DUPLEX: His previous carotid duplex scan on 05/27/2015 shows a 60-79% left internal carotid artery stenosis with no significant stenosis on the right. The stenosis on the left is in the mid internal carotid artery.  MEDICAL ISSUES: SYMPTOMATIC LEFT CAROTID STENOSIS: Given his recurrent neurologic symptoms, I agree with Dr. Sethi that we should proceed with left carotid endarterectomy in order to lower his risk of future stroke. I have scheduled this for tomorrow. I have reviewed the indications for carotid endarterectomy, that is to lower the risk of future   stroke. I have also reviewed the potential complications of surgery, including but not limited to: bleeding, stroke (perioperative risk 2-3%), MI, nerve injury of other unpredictable medical problems. All of the patients questions were answered and they are agreeable to proceed with surgery. He is on Plavix however I think we should proceed as I do not think would be safe to wait 5 days off of Plavix.    Waverly Ferrari [facility] [place]: [phone]

## 2015-06-17 NOTE — Progress Notes (Signed)
PATIENT DETAILS Name: Lucas Morgan Age: 74 y.o. Sex: male Date of Birth: [birth year] Admit Date: 06/14/2015 Admitting Physician Rolly Salter, MD ZOX:[id number], JENNY B, PA-C  Subjective: Seen in the PACU-slightly drowsy-but follows commands,moves all 4 ext. Some dysarthria present  Assessment/Plan: Principal Problem: CVA (cerebral infarction):Current CVA felt to be from symptomatic left carotid artery stenosis.Recent CVA (ON 05/24/15) felt to be embolic.Status post CEA today-Continue Antiplatelets, Statin.Has mild right sided weakness. Post op care deferred to [facility].  Active Problems: Urinary retention:Foley catheter placed after numerous straight cath unsuccessful.Continue Flomax.  Chronic systolic heart failure: EF on recent TEE around 30-35%, clinically compensated.Cautious while on IVF.  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  Chronic pain syndrome:has chronic back pain-prn narcotics  Disposition: Remain inpatient-?CIR in the next few days  Antimicrobial agents  See below  Anti-infectives    Start     Dose/Rate Route Frequency Ordered Stop   06/17/15 0800  [MAR Hold]  ceFAZolin (ANCEF) IVPB 1 g/50 mL premix     (MAR Hold since 06/17/15 0650)  Comments:  Send with pt to OR   1 g 100 mL/hr over 30 Minutes Intravenous To [facility]Stay [facility] 06/16/15 0916 06/18/15 0800   06/17/15 0717  dextrose 5 % with cefUROXime (ZINACEF) ADS Med    Comments:  Roney Mans   : cabinet override      06/17/15 0717 06/17/15 1929   06/17/15 0717  ceFAZolin (ANCEF) 2-3 GM-% IVPB SOLR    Comments:  Roney Mans    : cabinet override      06/17/15 0717 06/17/15 0759      DVT Prophylaxis: Prophylactic Lovenox   Code Status: Full code   Family Communication None at bedside  Procedures: Left CEA 10/4>>  CONSULTS:  neurology and vascular surgery  Time spent 30 minutes-Greater than 50% of this time was spent in counseling, explanation of diagnosis, planning of further management, and coordination of care.  MEDICATIONS: Scheduled Meds: . sodium chloride   Intravenous Once  . [MAR Hold] aspirin  325 mg Oral Daily  . [MAR Hold]  ceFAZolin (ANCEF) IV  1 g Intravenous To SS-Surg  . [MAR Hold] clopidogrel  75 mg Oral Daily  . cefUROXime (ZINACEF) 1.5 GM IVPB      . [MAR Hold] enoxaparin (LOVENOX) injection  40 mg Subcutaneous Q24H  . insulin aspart      . [MAR Hold] insulin aspart  0-15 Units Subcutaneous TID WC  . [MAR Hold] insulin aspart  0-5 Units Subcutaneous QHS  . [MAR Hold] insulin glargine  24 Units Subcutaneous QHS  . oxyCODONE      . [MAR Hold] simvastatin  40 mg Oral QHS   Continuous Infusions: . sodium chloride 50 mL/hr at 06/17/15 1352   PRN Meds:.sodium chloride, [MAR Hold] acetaminophen **OR** [MAR Hold] acetaminophen, [MAR Hold] ALPRAZolam, [MAR Hold] bisacodyl, [MAR Hold] feeding supplement (GLUCERNA SHAKE), [MAR Hold] HYDROcodone-acetaminophen, HYDROmorphone (DILAUDID) injection, promethazine, [MAR Hold] traMADol-acetaminophen    PHYSICAL EXAM: Vital signs in last 24 hours: Filed Vitals:   06/17/15 1426 06/17/15 1430 06/17/15 1500 06/17/15 1520  BP: 90/52  Pulse:  70 65 73  Temp:      TempSrc:      Resp:  Height:      Weight:      SpO2:  98% 100% 98%    Weight change:  Filed Weights   06/14/15 2213  Weight: 74.571 kg (164 lb 6.4 oz)   Body mass index is 24.27 kg/(m^2).   Gen Exam: Awake and alert,not in any distress Neck: Mild swelling at the left neck area Chest: B/L Clear.   CVS: S1 S2 Regular, no murmurs.  Abdomen: soft, BS +, non  tender, non distended.  Extremities: no edema, lower extremities warm to touch. Neurologic: RUW/RLE with minimal right sided weakness Skin: No Rash.   Wounds: N/A.   Intake/Output from previous day:  Intake/Output Summary (Last 24 hours) at 06/17/15 1553 Last data filed at 06/17/15 1002  Gross per 24 hour  Intake   1250 ml  Output   2505 ml  Net  -1255 ml     LAB RESULTS: CBC  Recent Labs Lab 06/14/15 1707 06/14/15 1711 06/15/15 0625  WBC  --  10.4 7.7  HGB 16.0 15.4 15.4  HCT 47.0 44.3 44.5  PLT  --  212 205  MCV  --  91.0 90.1  MCH  --  31.6 31.2  MCHC  --  34.8 34.6  RDW  --  13.0 12.9  LYMPHSABS  --  1.8  --   MONOABS  --  1.0  --   EOSABS  --  0.4  --   BASOSABS  --  0.0  --     Chemistries   Recent Labs Lab 06/14/15 1707 06/14/15 1711 06/15/15 0625  NA 137 135 134*  K 4.2 4.3 4.1  CL 101 102 100*  CO2  --  24 26  GLUCOSE 214* 211* 199*  BUN CREATININE 1.20 1.22 4.09  CALCIUM  --  9.3 9.2    CBG:  Recent Labs Lab 06/16/15 1641 06/16/15 2230 06/17/15 0725 06/17/15 1012 06/17/15 1516  GLUCAP 209* 239* 163* 253* 83    GFR Estimated Creatinine Clearance: 54.8 mL/min (by C-G formula based on Cr of 1.2).  Coagulation profile  Recent Labs Lab 06/14/15 1711  INR 1.02    Cardiac Enzymes No results for input(s): CKMB, TROPONINI, MYOGLOBIN in the last 168 hours.  Invalid input(s): CK  Invalid input(s): POCBNP No results for input(s): DDIMER in the last 72 hours. No results for input(s): HGBA1C in the last 72 hours. No results for input(s): CHOL, HDL, LDLCALC, TRIG, CHOLHDL, LDLDIRECT in the last 72 hours. No results for input(s): TSH, T4TOTAL, T3FREE, THYROIDAB in the last 72 hours.  Invalid input(s): FREET3 No results for input(s): VITAMINB12, FOLATE, FERRITIN, TIBC, IRON, RETICCTPCT in the last 72 hours. No results for input(s): LIPASE, AMYLASE in the last 72 hours.  Urine Studies No results for input(s): UHGB, CRYS  in the last 72 hours.  Invalid input(s): UACOL, UAPR, USPG, UPH, UTP, UGL, UKET, UBIL, UNIT, UROB, ULEU, UEPI, UWBC, URBC, UBAC, CAST, UCOM, BILUA  MICROBIOLOGY: No results found for this or any previous visit (from the past 240 hour(s)).  RADIOLOGY STUDIES/RESULTS: Dg Chest 2 View  05/25/2015   CLINICAL DATA:  Cough.  Recent stroke.  EXAM: CHEST  2 VIEW  COMPARISON:  10/18/2014  FINDINGS: The patient is mildly rotated to the right which partially limits evaluation of the mediastinum. Cardiomediastinal silhouette is grossly unchanged, with cardiac silhouette  upper limits of normal in size. The patient has taken a slightly shallower inspiration than on the prior study. Chronic interstitial coarsening is again seen and may reflect underlying COPD/chronic bronchitis. There is slightly increased heterogeneous opacity in the right lung base which projects posteriorly on the lateral image. No pleural effusion or pneumothorax is identified. Degenerative changes are noted at the [facility] joints.  IMPRESSION: COPD with mild right basilar infiltrate versus atelectasis.   Electronically Signed   By: Sebastian Ache M.D.   On: 05/25/2015 12:17   Dg Abd 1 View  05/30/2015   CLINICAL DATA:  Abdominal pain and tenderness  EXAM: ABDOMEN - 1 VIEW  COMPARISON:  04/08/2015  FINDINGS: The bowel gas pattern is normal. Moderate stool burden identified throughout the colon. No radio-opaque calculi or other significant radiographic abnormality are seen.  IMPRESSION: 1. Moderate stool burden suggesting constipation.   Electronically Signed   By: Signa Kell M.D.   On: 05/30/2015 08:58   Ct Head Wo Contrast  06/14/2015   CLINICAL DATA:  Acute dizziness for 1 day. Recent diagnosis of left frontal infarct.  EXAM: CT HEAD WITHOUT CONTRAST  TECHNIQUE: Contiguous axial images were obtained from the base of the skull through the vertex without intravenous contrast.  COMPARISON:  05/24/2015 and prior exams.  FINDINGS: A subacute left  frontal infarct is again identified with expected evolutionary changes.  Mild chronic small-vessel white matter ischemic changes, mild generalized cerebral volume loss and remote left parietal infarct again noted.  No acute intracranial abnormalities are identified, including mass lesion or mass effect, hydrocephalus, extra-axial fluid collection, midline shift, hemorrhage, or acute infarction.  The visualized bony calvarium is unremarkable.  Mild deformity and calcification of the left globe again noted.  IMPRESSION: No evidence of acute intracranial abnormality.  Evolutionary changes of known subacute left frontal infarct.  Mild atrophy, chronic small-vessel white matter ischemic changes and remote left parietal infarct.   Electronically Signed   By: Harmon Pier M.D.   On: 06/14/2015 19:11   Ct Head Wo Contrast  05/24/2015   CLINICAL DATA:  Code stroke. RIGHT-sided weakness. Symptoms began 4 days ago.  EXAM: CT HEAD WITHOUT CONTRAST  TECHNIQUE: Contiguous axial images were obtained from the base of the skull through the vertex without intravenous contrast.  COMPARISON:  CT head 06/10/2014.  MR brain 06/10/2014.  FINDINGS: Well-defined area of cytotoxic edema affects the LEFT superior frontal cortex and subcortical white matter consistent with an ischemic event onset 4 days ago. No hemorrhagic transformation. No other areas of acute infarction are suspected.  No CT findings to suggest proximal vascular thrombosis.  Mild atrophy. Chronic LEFT frontal and parietal subcortical infarction related to previous LEFT MCA territory ischemic insult documented on September 2015 MR. An additional LEFT parietal insult predated that stroke.  Hypoattenuation of the periventricular and subcortical regions consistent with small vessel disease.  No mass lesion, hydrocephalus, or extra-axial fluid.  Calvarium intact.  No sinus or mastoid air fluid level.  IMPRESSION: Well-defined area of cytotoxic edema affecting the LEFT superior  frontal cortex and subcortical white matter consistent with a late acute cerebral infarction. No features to suggest proximal vascular occlusion or hemorrhagic transformation.  Chronic changes as described.  Critical Value/emergent results were called by telephone at the time of interpretation on 05/24/2015 at 6:00 pm to Dr. Nelva Nay , who verbally acknowledged these results.   Electronically Signed   By: Elsie Stain M.D.   On: 05/24/2015 18:01   Ct Angio Neck  W/cm &/or Wo/cm  05/28/2015   CLINICAL DATA:  Functional deficits secondary to bilateral multi vascular territory infarcts.  EXAM: CT ANGIOGRAPHY NECK  TECHNIQUE: Multidetector CT imaging of the neck was performed using the standard protocol during bolus administration of intravenous contrast. Multiplanar CT image reconstructions and MIPs were obtained to evaluate the vascular anatomy. Carotid stenosis measurements (when applicable) are obtained utilizing NASCET criteria, using the distal internal carotid diameter as the denominator.  CONTRAST:  50mL OMNIPAQUE IOHEXOL 350 MG/ML SOLN  COMPARISON:  MRI of the brain May 25, 2015  FINDINGS: Normal appearance of the thoracic arch, normal branch pattern, mild calcific atherosclerosis. The origins of the innominate, left Common carotid artery and subclavian artery are widely patent.  Bilateral Common carotid arteries are widely patent, coursing in a straight line fashion. Mild intimal thickening of the distal Common carotid arteries. Calcific atherosclerosis of bilateral carotid bulbs. 15 mm segment of 50% stenosis RIGHT ICA origin. 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 No pseudoaneurysm. Normal appearance of the included internal carotid arteries.  Left vertebral artery is dominant. Normal appearance of the vertebral arteries, which appear  widely patent. RIGHT vertebral artery predominately terminates in the posterior inferior cerebellar artery.  No contrast extravasation.  17 mm LEFT thyroid nodule. LEFT parotid sialolith. Subcentimeter radiopaque foreign body within the RIGHT superficial neck subcutaneous fat may represent catheter fragment or other non metallic object. No acute osseous process though bone windows have not been submitted ; severe RIGHT C5-6 neural foraminal narrowing. Mild bronchial wall thickening and patchy upper lobe airspace opacities which could represent atelectasis or infiltrate, biapical bullous changes.  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  50% stenosis RIGHT ICA origin.  17 mm LEFT thyroid nodule for which follow-up dedicated thyroid sonogram is recommended on a nonemergent basis.   Electronically Signed   By: Awilda Metro M.D.   On: 05/28/2015 22:33   Mr Shirlee Latch Wo Contrast  06/14/2015   CLINICAL DATA:  74 year old diabetic male with dyslipidemia and renal insufficiency complaining of dizziness since yesterday. Recent stroke. Subsequent encounter.  EXAM: MRI HEAD WITHOUT CONTRAST  MRA HEAD WITHOUT CONTRAST  TECHNIQUE: Multiplanar, multiecho pulse sequences of the brain and surrounding structures were obtained without intravenous contrast. Angiographic images of the head were obtained using MRA technique without contrast.  COMPARISON:  06/14/2015 head CT.  05/25/2015 MR.  FINDINGS: MRI HEAD FINDINGS  Exam is motion degraded.  Several subacute infarcts demonstrate expected evolution. Largest subacute infarct left frontal lobe (with mild laminar necrosis). Scattered small subacute infarcts noted bilaterally.  New tiny acute left caudate head infarct.  Remote small caudate infarct.  No intracranial hemorrhage.  Moderate small vessel disease type changes.  Global atrophy  without hydrocephalus.  No intracranial mass lesion noted on this unenhanced exam.  Left phthisis bulbi.  Cervical medullary junction, pituitary region and pineal region unremarkable.  Polypoid opacification anterior aspect sphenoid sinus.  MRA HEAD FINDINGS  Anterior circulation without medium or large size vessel significant stenosis or occlusion.  Middle cerebral artery branch vessel narrowing and irregularity bilaterally.  Right vertebral artery ends in a posterior inferior cerebellar artery distribution. The right posterior inferior cerebellar artery is narrowed/ irregular and poorly delineated.  No significant stenosis left vertebral artery  or basilar artery.  Nonvisualized anterior inferior cerebellar arteries.  Mild irregularity of portions of the posterior cerebral artery bilaterally with narrowing most notable involving distal branches.  No aneurysm noted.  IMPRESSION: MRI HEAD  Exam is motion degraded.  New tiny acute left caudate head infarct.  Several subacute infarcts demonstrate expected evolution. Largest subacute infarct left frontal lobe (with mild laminar necrosis). Scattered small subacute infarcts noted bilaterally.  Remote small caudate infarct.  No intracranial hemorrhage.  Moderate small vessel disease type changes.  Global atrophy without hydrocephalus.  Polypoid opacification anterior aspect sphenoid sinus.  MRA HEAD FINDINGS  Anterior circulation without medium or large size vessel significant stenosis or occlusion.  Middle cerebral artery branch vessel narrowing and irregularity bilaterally.  Right vertebral artery ends in a posterior inferior cerebellar artery distribution. The right posterior inferior cerebellar artery is narrowed/ irregular and poorly delineated.  No significant stenosis left vertebral artery or basilar artery.  Nonvisualized anterior inferior cerebellar arteries.  Mild irregularity of portions of the posterior cerebral artery bilaterally with narrowing most notable  involving distal branches.   Electronically Signed   By: Lacy Duverney M.D.   On: 06/14/2015 21:04   Mr Brain Wo Contrast  06/14/2015   CLINICAL DATA:  74 year old diabetic male with dyslipidemia and renal insufficiency complaining of dizziness since yesterday. Recent stroke. Subsequent encounter.  EXAM: MRI HEAD WITHOUT CONTRAST  MRA HEAD WITHOUT CONTRAST  TECHNIQUE: Multiplanar, multiecho pulse sequences of the brain and surrounding structures were obtained without intravenous contrast. Angiographic images of the head were obtained using MRA technique without contrast.  COMPARISON:  06/14/2015 head CT.  05/25/2015 MR.  FINDINGS: MRI HEAD FINDINGS  Exam is motion degraded.  Several subacute infarcts demonstrate expected evolution. Largest subacute infarct left frontal lobe (with mild laminar necrosis). Scattered small subacute infarcts noted bilaterally.  New tiny acute left caudate head infarct.  Remote small caudate infarct.  No intracranial hemorrhage.  Moderate small vessel disease type changes.  Global atrophy without hydrocephalus.  No intracranial mass lesion noted on this unenhanced exam.  Left phthisis bulbi.  Cervical medullary junction, pituitary region and pineal region unremarkable.  Polypoid opacification anterior aspect sphenoid sinus.  MRA HEAD FINDINGS  Anterior circulation without medium or large size vessel significant stenosis or occlusion.  Middle cerebral artery branch vessel narrowing and irregularity bilaterally.  Right vertebral artery ends in a posterior inferior cerebellar artery distribution. The right posterior inferior cerebellar artery is narrowed/ irregular and poorly delineated.  No significant stenosis left vertebral artery or basilar artery.  Nonvisualized anterior inferior cerebellar arteries.  Mild irregularity of portions of the posterior cerebral artery bilaterally with narrowing most notable involving distal branches.  No aneurysm noted.  IMPRESSION: MRI HEAD  Exam is  motion degraded.  New tiny acute left caudate head infarct.  Several subacute infarcts demonstrate expected evolution. Largest subacute infarct left frontal lobe (with mild laminar necrosis). Scattered small subacute infarcts noted bilaterally.  Remote small caudate infarct.  No intracranial hemorrhage.  Moderate small vessel disease type changes.  Global atrophy without hydrocephalus.  Polypoid opacification anterior aspect sphenoid sinus.  MRA HEAD FINDINGS  Anterior circulation without medium or large size vessel significant stenosis or occlusion.  Middle cerebral artery branch vessel narrowing and irregularity bilaterally.  Right vertebral artery ends in a posterior inferior cerebellar artery distribution. The right posterior inferior cerebellar artery is narrowed/ irregular and poorly delineated.  No significant stenosis left vertebral artery or basilar artery.  Nonvisualized anterior inferior cerebellar arteries.  Mild  irregularity of portions of the posterior cerebral artery bilaterally with narrowing most notable involving distal branches.   Electronically Signed   By: Lacy Duverney M.D.   On: 06/14/2015 21:04   Mr Brain Wo Contrast  05/25/2015   CLINICAL DATA:  Recent left-sided infarct with right-sided weakness and aphasia. The weakness has improved but the aphasia continues. The patient was discharged from an outside hospital 2 days ago.  EXAM: MRI HEAD WITHOUT CONTRAST  MRA HEAD WITHOUT CONTRAST  TECHNIQUE: Multiplanar, multiecho pulse sequences of the brain and surrounding structures were obtained without intravenous contrast. Angiographic images of the head were obtained using MRA technique without contrast.  COMPARISON:  None.  CT head without contrast 05/24/2015.  FINDINGS: MRI HEAD FINDINGS  The acute/subacute cortical and subcortical infarct in the high left frontal lobe is confirmed. This involves the [employer]. Additional scattered foci are present in the frontal lobe and parietal  lobe on the left and what appears to be watershed distribution. There is additional involvement of the more superior and medial primary motor cortex. Portions of the inferior parietal and left occipital lobe are noted as well.  Additionally, at least 4 punctate areas of restricted diffusion are present in the right parietal and medial occipital lobe. There is no hemorrhage associated with these lesions. T2 changes are evident within the areas of acute/subacute infarct.  Moderate periventricular and subcortical T2 changes are additionally present bilaterally. Brainstem is unremarkable. Flow is present in the major intracranial arteries.  The study is moderately degraded by patient motion and could not be completed.  The left globe is collapsed with increased signal. The right globe is intact. The orbits are otherwise unremarkable. The paranasal sinuses and mastoid air cells are clear. Midline structures are unremarkable.  MRA HEAD FINDINGS  The internal carotid arteries are within normal limits from the high cervical segments through the ICA termini bilaterally. Moderate narrowing is present in the mid right A1 segment. More mild narrowing is present in the mid left A1 segment. The M1 segments are intact. The left ACA is duplicated. The anterior communicating artery appears to be patent. The MCA bifurcations are intact bilaterally. There is moderate attenuation of MCA branch vessels distally, right greater than left.  The right vertebral artery terminates at the PICA. The left vertebral artery is within normal limits. The PICA origin is visualized and normal. The basilar artery is within normal limits. Both posterior cerebral arteries originate from the basilar tip. There is some attenuation of distal PCA branch vessels.  IMPRESSION: 1. Acute/subacute nonhemorrhagic infarct involving the left frontal lobe to level of the precentral gyrus. 2. Additional smaller non confluent punctate areas of infarction within the  more anterior left frontal lobe, the left parietal lobe, and left occipital lobe seen to follow a watershed distribution. This suggests more proximal disease, potentially in the neck, or and episode of hypotension. 3. At least 4 punctate foci of acute nonhemorrhagic infarct are noted within the right parietal and occipital lobe. Given the bilateral distribution, a central embolic source also needs to be considered. 4. Age advanced atrophy and diffuse white matter disease. 5. Moderate right and mild left mid A1 segment stenoses. 6. Moderate distal small vessel disease, most evident within the right MCA branch vessels.   Electronically Signed   By: Marin Roberts M.D.   On: 05/25/2015 10:41   Mr Maxine Glenn Head/brain Wo Cm  05/25/2015   CLINICAL DATA:  Recent left-sided infarct with right-sided weakness and aphasia. The weakness  has improved but the aphasia continues. The patient was discharged from an outside hospital 2 days ago.  EXAM: MRI HEAD WITHOUT CONTRAST  MRA HEAD WITHOUT CONTRAST  TECHNIQUE: Multiplanar, multiecho pulse sequences of the brain and surrounding structures were obtained without intravenous contrast. Angiographic images of the head were obtained using MRA technique without contrast.  COMPARISON:  None.  CT head without contrast 05/24/2015.  FINDINGS: MRI HEAD FINDINGS  The acute/subacute cortical and subcortical infarct in the high left frontal lobe is confirmed. This involves the [employer]. Additional scattered foci are present in the frontal lobe and parietal lobe on the left and what appears to be watershed distribution. There is additional involvement of the more superior and medial primary motor cortex. Portions of the inferior parietal and left occipital lobe are noted as well.  Additionally, at least 4 punctate areas of restricted diffusion are present in the right parietal and medial occipital lobe. There is no hemorrhage associated with these lesions. T2 changes are  evident within the areas of acute/subacute infarct.  Moderate periventricular and subcortical T2 changes are additionally present bilaterally. Brainstem is unremarkable. Flow is present in the major intracranial arteries.  The study is moderately degraded by patient motion and could not be completed.  The left globe is collapsed with increased signal. The right globe is intact. The orbits are otherwise unremarkable. The paranasal sinuses and mastoid air cells are clear. Midline structures are unremarkable.  MRA HEAD FINDINGS  The internal carotid arteries are within normal limits from the high cervical segments through the ICA termini bilaterally. Moderate narrowing is present in the mid right A1 segment. More mild narrowing is present in the mid left A1 segment. The M1 segments are intact. The left ACA is duplicated. The anterior communicating artery appears to be patent. The MCA bifurcations are intact bilaterally. There is moderate attenuation of MCA branch vessels distally, right greater than left.  The right vertebral artery terminates at the PICA. The left vertebral artery is within normal limits. The PICA origin is visualized and normal. The basilar artery is within normal limits. Both posterior cerebral arteries originate from the basilar tip. There is some attenuation of distal PCA branch vessels.  IMPRESSION: 1. Acute/subacute nonhemorrhagic infarct involving the left frontal lobe to level of the precentral gyrus. 2. Additional smaller non confluent punctate areas of infarction within the more anterior left frontal lobe, the left parietal lobe, and left occipital lobe seen to follow a watershed distribution. This suggests more proximal disease, potentially in the neck, or and episode of hypotension. 3. At least 4 punctate foci of acute nonhemorrhagic infarct are noted within the right parietal and occipital lobe. Given the bilateral distribution, a central embolic source also needs to be considered. 4. Age  advanced atrophy and diffuse white matter disease. 5. Moderate right and mild left mid A1 segment stenoses. 6. Moderate distal small vessel disease, most evident within the right MCA branch vessels.   Electronically Signed   By: Marin Roberts M.D.   On: 05/25/2015 10:41    Jeoffrey Massed, MD  [facility] Pager:[phone]  If 7PM-7AM, please contact night-coverage [email or web address] Password [email or web address] 06/17/2015, 3:53 PM   LOS: 1 day

## 2015-06-17 NOTE — Progress Notes (Signed)
#   2 fluid bolus given for cont low BP. Dr Okey Dupre updated, aware CBG 253-order to cover with 8 units Novolog Insulin per SSI orders. Will cont to monitor.

## 2015-06-17 NOTE — Progress Notes (Signed)
Albumin infusing upon adm. to PACU for low BP. Dr Edilia Bo here & aware. Will cont to monitor closely.

## 2015-06-17 NOTE — Progress Notes (Signed)
   VASCULAR SURGERY POST OP NOTE:  * Stable postop. Waiting a stepdown bed.  *  Blood pressure better after 2 fluid boluses.  SUBJECTIVE: no specific complaints.  PHYSICAL EXAM: Filed Vitals:   06/17/15 1226 06/17/15 1230 06/17/15 1256 06/17/15 1300  BP: 83/44  94/44   Pulse:  63  66  Temp:      TempSrc:      Resp:  17  17  Height:      Weight:      SpO2:  100%  100%   Slight deviation of the tongue to the right. Right upper extremity weakness unchanged. Breast of a fascial unchanged. Surgeon looks fine.  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

## 2015-06-17 NOTE — Progress Notes (Signed)
Physical medicine rehabilitation consult requested chart reviewed. Patient well known to rehabilitation services. Will await carotid enterectomy and follow-up with appropriate consult and recommendations

## 2015-06-17 NOTE — Progress Notes (Signed)
Kim PA at bedside to see pt, fully updated. Aware cuff BP's in the 80's systolic, art line 106-112. No new orders. Will cont to monitor.

## 2015-06-18 ENCOUNTER — Inpatient Hospital Stay (HOSPITAL_COMMUNITY): Payer: Medicare Other

## 2015-06-18 ENCOUNTER — Encounter (HOSPITAL_COMMUNITY): Payer: Self-pay | Admitting: Vascular Surgery

## 2015-06-18 DIAGNOSIS — I6522 Occlusion and stenosis of left carotid artery: Secondary | ICD-10-CM

## 2015-06-18 DIAGNOSIS — I429 Cardiomyopathy, unspecified: Secondary | ICD-10-CM

## 2015-06-18 DIAGNOSIS — I5022 Chronic systolic (congestive) heart failure: Secondary | ICD-10-CM

## 2015-06-18 DIAGNOSIS — I255 Ischemic cardiomyopathy: Secondary | ICD-10-CM

## 2015-06-18 DIAGNOSIS — Z79899 Other long term (current) drug therapy: Secondary | ICD-10-CM

## 2015-06-18 LAB — GLUCOSE, CAPILLARY
GLUCOSE-CAPILLARY: 190 mg/dL — AB (ref 65–99)
GLUCOSE-CAPILLARY: 227 mg/dL — AB (ref 65–99)
GLUCOSE-CAPILLARY: 166 mg/dL — AB (ref 65–99)
GLUCOSE-CAPILLARY: 240 mg/dL — AB (ref 65–99)

## 2015-06-18 LAB — BASIC METABOLIC PANEL
Anion gap: 10 (ref 5–15)
BUN: 10 mg/dL (ref 6–20)
CO2: 25 mmol/L (ref 22–32)
Calcium: 8.4 mg/dL — ABNORMAL LOW (ref 8.9–10.3)
Chloride: 101 mmol/L (ref 101–111)
Creatinine, Ser: 1.09 mg/dL (ref 0.61–1.24)
GFR calc Af Amer: >60 mL/min (ref >60)
GFR calc non Af Amer: >60 mL/min (ref >60)
Glucose, Bld: 160 mg/dL — ABNORMAL HIGH (ref 65–99)
Potassium: 3.9 mmol/L (ref 3.5–5.1)
Sodium: 136 mmol/L (ref 135–145)

## 2015-06-18 LAB — CBC
HCT: 40.7 % (ref 39.0–52.0)
HEMOGLOBIN: 13.9 g/dL (ref 13.0–17.0)
MCH: 31.4 pg (ref 26.0–34.0)
MCHC: 34.2 g/dL (ref 30.0–36.0)
MCV: 92.1 fL (ref 78.0–100.0)
PLATELETS: 164 10*3/uL (ref 150–400)
RBC: 4.42 MIL/uL (ref 4.22–5.81)
RDW: 13.1 % (ref 11.5–15.5)
WBC: 8.9 10*3/uL (ref 4.0–10.5)

## 2015-06-18 MED ORDER — ASPIRIN EC 81 MG PO TBEC
81.0000 mg | DELAYED_RELEASE_TABLET | Freq: Every day | ORAL | Status: DC
  Administered 2015-06-19: 81 mg via ORAL
  Filled 2015-06-18 (×2): qty 1

## 2015-06-18 NOTE — Evaluation (Signed)
Clinical/Bedside Swallow Evaluation Patient Details  Name: ONDRE SALVETTI MRN: [id number] Date of Birth: 03-06-1941  Today's Date: 06/18/2015 Time: SLP Start Time (ACUTE ONLY): 0930 SLP Stop Time (ACUTE ONLY): 1010 SLP Time Calculation (min) (ACUTE ONLY): 40 min  Past Medical History:  Past Medical History  Diagnosis Date  . Diabetes mellitus   . Dyslipidemia   . MI (myocardial infarction) (HCC) 11/09/2008    2.5 x 23 Xience V DES to the CFX  . AAA (abdominal aortic aneurysm) (HCC)     a. Abd U/S 7/14: mild aneurysmal dilatation 3x3 cm; cholelithiasis without evid of cholecystitis => repeat 1 year  . Coronary artery disease   . Arthritis     stenosis, lumbar region  . Renal insufficiency   . Stroke [facility])    Past Surgical History:  Past Surgical History  Procedure Laterality Date  . Knee surgery    . Coronary stent placement    . Back surgery  2015    lumbar fusion  . Joint replacement Left   . 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 Lumbar laminectomy/decompression microdiscectomy N/A 10/18/2014    Procedure: LUMBAR LAMINECTOMY/DECOMPRESSION MICRODISCECTOMYLUMBAR THREE-FOUR ;  Surgeon: Temple Pacini, MD;  Location: [facility] NEURO ORS;  Service: Neurosurgery;  Laterality: N/A;  . Coronary angioplasty    . Ep implantable device N/A 05/26/2015    Procedure: Loop Recorder Insertion;  Surgeon: Marinus Maw, MD;  Location: [facility] INVASIVE CV LAB;  Service: Cardiovascular;  Laterality: N/A;  . Tee without cardioversion N/A 05/26/2015    Procedure: TRANSESOPHAGEAL ECHOCARDIOGRAM (TEE);  Surgeon: Vesta Mixer, MD;  Location: [facility] ENDOSCOPY;  Service: Cardiovascular;  Laterality: N/A;  . Endarterectomy Left 06/17/2015    Procedure: Left Carotid ENDARTERECTOMY with Livia Snellen Patch;  Surgeon: Chuck Hint, MD;  Location: [facility] OR;  Service: Vascular;  Laterality: Left;   HPI:  74 y.o. male with history of diabetes mellitus, hyperlipidemia, coronary artery disease  with previous MI, abdominal aortic aneurysm, renal insufficiency, and recent stroke presenting with worsening speech and right hemiparesis. MRI reveals acute/subacute nonhemorrhagic infarct involving the left frontal lobe to level of precentral gyrus; smaller non confluent punctate areas of infarction within the more anterior left frontal lobe, the left parietal lobe, and left occipital lobe seen to follow a watershed distribution; At least 4 punctate foci of acute nonhemorrhagic infarct are noted within the right parietal and occipital lobe.  Recent CIR stay from 9/15-9/27 with signifcant improvements in communication upon D/C; pt was anticipating [facility] therapies post discharge. Pt had left CEA on 06/17/15, observed coughing multiple times after drinking thin liquids.    Assessment / Plan / Recommendation Clinical Impression  Pt showed overt s/sx of aspiration when drinking large consecutive straw sips of thin liquids with multiple pills as evidenced by immediate multiple coughing and delayed throat clearing. Pt did not show any other s/sx of aspiration during swallow study, but complained of pills getting stuck on tongue. SLP recommends continue regular diet with thin liquids, no straw, and pills whole in puree, one at a time. Pt has a mild risk of aspiration, reduced by taking small sips and bites when sitting upright. SLP will f/u with check for diet tolerance.     Aspiration Risk  Mild    Diet Recommendation Age appropriate regular solids;Thin   Medication Administration: Whole meds with puree Compensations: Slow rate;Small sips/bites    Other  Recommendations Oral Care Recommendations: Oral  care BID   Follow Up Recommendations       Frequency and Duration min 2x/week  2 weeks   Pertinent Vitals/Pain NA    SLP Swallow Goals     Swallow Study Prior Functional Status       General Other Pertinent Information: 74 y.o. male with history of diabetes mellitus, hyperlipidemia, coronary  artery disease with previous MI, abdominal aortic aneurysm, renal insufficiency, and recent stroke presenting with worsening speech and right hemiparesis. MRI reveals acute/subacute nonhemorrhagic infarct involving the left frontal lobe to level of precentral gyrus; smaller non confluent punctate areas of infarction within the more anterior left frontal lobe, the left parietal lobe, and left occipital lobe seen to follow a watershed distribution; At least 4 punctate foci of acute nonhemorrhagic infarct are noted within the right parietal and occipital lobe.  Recent CIR stay from 9/15-9/27 with signifcant improvements in communication upon D/C; pt was anticipating [facility] therapies post discharge.  Now with plan for left CEA 06/17/15.  Type of Study: Bedside swallow evaluation Diet Prior to this Study: Regular;Thin liquids Temperature Spikes Noted: No Respiratory Status: Room air History of Recent Intubation: Yes Length of Intubations (days): 1 days (for procedure) Date extubated: 06/17/15 Behavior/Cognition: Alert;Cooperative;Pleasant mood Oral Cavity - Dentition: Edentulous Self-Feeding Abilities: Able to feed self;Needs set up Patient Positioning: Upright in chair/Tumbleform Baseline Vocal Quality: Normal Volitional Cough: Strong Volitional Swallow: Able to elicit    Oral/Motor/Sensory Function Overall Oral Motor/Sensory Function: Appears within functional limits for tasks assessed Labial Symmetry: Abnormal symmetry right Labial Strength: Reduced   Ice Chips Ice chips: Not tested   Thin Liquid Thin Liquid: Impaired Presentation: Cup;Straw Pharyngeal  Phase Impairments: Multiple swallows;Cough - Immediate    Nectar Thick Nectar Thick Liquid: Not tested   Honey Thick Honey Thick Liquid: Not tested   Puree Puree: Within functional limits Presentation: Self Fed   Solid   GO    Solid: Within functional limits Presentation: Self Fed      Riccardo Dubin, Student-SLP  Riccardo Dubin 06/18/2015,10:36 AM

## 2015-06-18 NOTE — Consult Note (Signed)
Cardiologist:  Branch Reason for Consult: Referring Physician: [name] is an 74 y.o. male.  HPI:   She is a 74 year old male with a history of diabetes, dyslipidemia, coronary artery disease with a drug-eluting stent to the circumflex in 2010, abdominal aortic aneurysm, renal insufficiency, stroke, arthritis.  A lower extremity ABIs June 2016 which are 1.17 to 1.12 on the right and left respectively.  He was discharged on September 28 after having an acute embolic stroke.   He presented on 10/1 with complaints of difficulty ambulating. MRI of the brain showed acute left caudate infarct.  He underwent left carotid endarterectomy with bovine pericardial patch angioplasty yesterday to decrease his risk of future stroke.  He had a loop recorder placed on 05/26/2015 by Dr. Lovena Le. This was interrogated and showed no atrial fibrillation.   We are asked to see the patient due to new decrease in his ejection fraction.  During his previous admission(TEE 05/26/2015) ejection fraction was noted to be 30-35%. In September 2015 he had an echocardiogram at [facility] and his ejection fraction was 50-55% at that time.  Until the initial stroke the patient has been very active mowing the grass and working in the garden.  At no time did he have any shortness of breath or chest pain and he has not had any since the stroke. He sleeps on about 10 pillows because of his back issues.  He denies any orthopnea, PND or lower extremity edema.  He's been having issues with dizziness which comes and goes.  During her episode of dizziness at home his blood pressure was 116/98.  EKG shows right bundle-branch left fascicular lock along with inferior Q waves. There is really no acute changes compared to the last couple years. In one episode of vomiting the other day while in the hospital.  The patient currently denies  fever, cough, congestion, abdominal pain, hematochezia, melena, claudication.  He feels that his  strength is equal on both sides.   Postop yesterday he required dopamine which was weaned off today.   Past Medical History  Diagnosis Date  . Diabetes mellitus   . Dyslipidemia   . MI (myocardial infarction) ([place]) 11/09/2008    2.5 x 23 Xience V DES to the CFX  . AAA (abdominal aortic aneurysm) ([place])     a. Abd U/S 7/14: mild aneurysmal dilatation 3x3 cm; cholelithiasis without evid of cholecystitis => repeat 1 year  . Coronary artery disease   . Arthritis     stenosis, lumbar region  . Renal insufficiency   . Stroke [facility])     Past Surgical History  Procedure Laterality Date  . Knee surgery    . Coronary stent placement    . Back surgery  2015    lumbar fusion  . Joint replacement Left   . 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 Lumbar laminectomy/decompression microdiscectomy N/A 10/18/2014    Procedure: LUMBAR LAMINECTOMY/DECOMPRESSION MICRODISCECTOMYLUMBAR THREE-FOUR ;  Surgeon: Charlie Pitter, MD;  Location: [place] NEURO ORS;  Service: Neurosurgery;  Laterality: N/A;  . Coronary angioplasty    . Ep implantable device N/A 05/26/2015    Procedure: Loop Recorder Insertion;  Surgeon: Evans Lance, MD;  Location: [facility] CV LAB;  Service: Cardiovascular;  Laterality: N/A;  . Tee without cardioversion N/A 05/26/2015    Procedure: TRANSESOPHAGEAL ECHOCARDIOGRAM (TEE);  Surgeon: Thayer Headings, MD;  Location: [place];  Service:  Cardiovascular;  Laterality: N/A;  . Endarterectomy Left 06/17/2015    Procedure: Left Carotid ENDARTERECTOMY with Rueben Bash Patch;  Surgeon: Angelia Mould, MD;  Location: [facility] OR;  Service: Vascular;  Laterality: Left;    Family History  Problem Relation Age of Onset  . Heart attack Brother     x2 brothers  . CAD Brother     Social History:  reports that he quit smoking about 21 years ago. He has never used smokeless tobacco. He reports that he does not drink alcohol or use illicit drugs.  Allergies:  Allergies    Allergen Reactions  . Trazodone And Nefazodone Other (See Comments)    High blood sugar    Medications:  Scheduled Meds: . sodium chloride   Intravenous Once  . aspirin EC  81 mg Oral Daily  . clopidogrel  75 mg Oral Daily  . docusate sodium  100 mg Oral Daily  . enoxaparin (LOVENOX) injection  40 mg Subcutaneous Q24H  . insulin aspart  0-15 Units Subcutaneous TID WC  . insulin aspart  0-5 Units Subcutaneous QHS  . insulin glargine  24 Units Subcutaneous QHS  . pantoprazole  40 mg Oral Daily  . simvastatin  40 mg Oral QHS   Continuous Infusions: . sodium chloride Stopped (06/18/15 0810)  . DOPamine Stopped (06/18/15 0811)   PRN Meds:.sodium chloride, acetaminophen **OR** acetaminophen, ALPRAZolam, alum & mag hydroxide-simeth, bisacodyl, DOPamine, feeding supplement (GLUCERNA SHAKE), guaiFENesin-dextromethorphan, hydrALAZINE, HYDROcodone-acetaminophen, labetalol, magnesium sulfate 1 - 4 g bolus IVPB, metoprolol, morphine injection, ondansetron, phenol, potassium chloride, traMADol-acetaminophen   Results for orders placed or performed during the hospital encounter of 06/14/15 (from the past 48 hour(s))  Glucose, capillary     Status: Abnormal   Collection Time: 06/16/15 11:57 AM  Result Value Ref Range   Glucose-Capillary 174 (H) 65 - 99 mg/dL   Comment 1 Notify RN    Comment 2 Document in Chart   Glucose, capillary     Status: Abnormal   Collection Time: 06/16/15  4:41 PM  Result Value Ref Range   Glucose-Capillary 209 (H) 65 - 99 mg/dL   Comment 1 Notify RN    Comment 2 Document in Chart   Glucose, capillary     Status: Abnormal   Collection Time: 06/16/15 10:30 PM  Result Value Ref Range   Glucose-Capillary 239 (H) 65 - 99 mg/dL   Comment 1 Notify RN    Comment 2 Document in Chart   Glucose, capillary     Status: Abnormal   Collection Time: 06/17/15  7:25 AM  Result Value Ref Range   Glucose-Capillary 163 (H) 65 - 99 mg/dL  Type and screen     Status: None    Collection Time: 06/17/15  7:25 AM  Result Value Ref Range   ABO/RH(D) O POS    Antibody Screen NEG    Sample Expiration 06/20/2015   ABO/Rh     Status: None   Collection Time: 06/17/15  7:25 AM  Result Value Ref Range   ABO/RH(D) O POS   Glucose, capillary     Status: Abnormal   Collection Time: 06/17/15 10:12 AM  Result Value Ref Range   Glucose-Capillary 253 (H) 65 - 99 mg/dL   Comment 1 Notify RN   Glucose, capillary     Status: None   Collection Time: 06/17/15  3:16 PM  Result Value Ref Range   Glucose-Capillary 83 65 - 99 mg/dL   Comment 1 Notify RN    Comment 2  Document in Chart   Glucose, capillary     Status: None   Collection Time: 06/17/15  4:35 PM  Result Value Ref Range   Glucose-Capillary 97 65 - 99 mg/dL  Glucose, capillary     Status: Abnormal   Collection Time: 06/17/15  9:17 PM  Result Value Ref Range   Glucose-Capillary 201 (H) 65 - 99 mg/dL  CBC     Status: None   Collection Time: 06/18/15  5:35 AM  Result Value Ref Range   WBC 8.9 4.0 - 10.5 K/uL   RBC 4.42 4.22 - 5.81 MIL/uL   Hemoglobin 13.9 13.0 - 17.0 g/dL   HCT 40.7 39.0 - 52.0 %   MCV 92.1 78.0 - 100.0 fL   MCH 31.4 26.0 - 34.0 pg   MCHC 34.2 30.0 - 36.0 g/dL   RDW 13.1 11.5 - 15.5 %   Platelets 164 150 - 400 K/uL  Basic metabolic panel     Status: Abnormal   Collection Time: 06/18/15  5:35 AM  Result Value Ref Range   Sodium 136 135 - 145 mmol/L   Potassium 3.9 3.5 - 5.1 mmol/L   Chloride 101 101 - 111 mmol/L   CO2 25 22 - 32 mmol/L   Glucose, Bld 160 (H) 65 - 99 mg/dL   BUN 10 6 - 20 mg/dL   Creatinine, Ser 1.09 0.61 - 1.24 mg/dL   Calcium 8.4 (L) 8.9 - 10.3 mg/dL   GFR calc non Af Amer >60 >60 mL/min   GFR calc Af Amer >60 >60 mL/min    Comment: (NOTE) The eGFR has been calculated using the CKD EPI equation. This calculation has not been validated in all clinical situations. eGFR's persistently <60 mL/min signify possible Chronic Kidney Disease.    Anion gap 10 5 - 15  Glucose,  capillary     Status: Abnormal   Collection Time: 06/18/15  8:36 AM  Result Value Ref Range   Glucose-Capillary 190 (H) 65 - 99 mg/dL    No results found.  Review of Systems  Constitutional: Negative for fever.  HENT: Negative for congestion and sore throat.   Cardiovascular: Negative for chest pain, orthopnea, leg swelling and PND.  Gastrointestinal: Positive for nausea and vomiting (times one episode). Negative for abdominal pain, blood in stool and melena.  Musculoskeletal: Positive for back pain (chronic).  Neurological: Positive for dizziness.  All other systems reviewed and are negative.  Blood pressure 96/55, pulse 99, temperature 98 F (36.7 C), temperature source Oral, resp. rate 19, height 5' 9"  (1.753 m), weight 74.5 kg (164 lb 3.9 oz), SpO2 97 %. Physical Exam  Nursing note and vitals reviewed. Constitutional: He is oriented to person, place, and time. He appears well-developed and well-nourished. No distress.  HENT:  Head: Normocephalic and atraumatic.  Eyes: EOM are normal. Pupils are equal, round, and reactive to light.  Neck: Normal range of motion. Neck supple. No JVD present.  Cardiovascular: Normal rate, regular rhythm, S1 normal and S2 normal.   No murmur heard. Pulses:      Radial pulses are 2+ on the right side, and 2+ on the left side.       Dorsalis pedis pulses are 1+ on the right side, and 1+ on the left side.  Respiratory: Effort normal. He has no wheezes.  Decreased BS bilaterally.  Mild crackles  GI: Soft. Bowel sounds are normal. He exhibits no distension. There is no tenderness.  Musculoskeletal: He exhibits no edema.  Neurological: He  is alert and oriented to person, place, and time. He exhibits normal muscle tone.  Expressive aphasia  Skin: Skin is warm and dry.  Psychiatric: He has a normal mood and affect.    Assessment/Plan: Principal Problem:   CVA (cerebral infarction) Active Problems:   CAD, NATIVE VESSEL   DM type 2 causing CKD  stage 2 (HCC)   Carotid stenosis   Chronic pain syndrome   Dyslipidemia   Cerebral infarction due to unspecified mechanism   Cardiomyopathy  74 year old male with a history of diabetes, dyslipidemia, coronary artery disease with a drug-eluting stent to the circumflex in 2010, abdominal aortic aneurysm, renal insufficiency, recent stroke in Sept, arthritis. He presented with right sided weakness.  EF on TEE last adm was 30-35%.  The patient does not appear to have acute CHF symptoms. No acute changes on EKG.  We will order an IP transthoracic echo and arrange OP follow up with Dr. Harl [name] at which time he may be able to add cardiac meds.  Further plan depending on echo.       HAGER, [place], [place] 06/18/2015, 11:11 AM  As above, patient seen and examined. Briefly he is a 74 year old male with past medical history of diabetes mellitus, hyperlipidemia, coronary artery disease, abdominal aortic aneurysm, renal insufficiency and stroke who I'm asked to evaluate for cardiomyopathy. Patient had a stroke in September. Transesophageal echocardiogram during that admission showed ejection fraction 30-35% which was decreased compared to previous. He also had an implantable loop monitor placed. He returned with another stroke and had carotid endarterectomy yesterday. It was noted that his ejection fraction on previous admissions transesophageal echocardiogram was decreased compared to previous and cardiology asked to evaluate. Patient denies dyspnea or chest pain.  Electrocardiogram shows sinus rhythm, right bundle branch block, left anterior fascicular block, left ventricular hypertrophy, prior inferior infarct and lateral infarct.  Plan to continue aspirin, Plavix and statin. We will repeat a transthoracic echocardiogram to see if his LV function has improved. If not he can follow-up as an outpatient with Dr. Harl [name] in [place] and have a nuclear study for risk stratification. I would not pursue this right now  given recent CVA and recent carotid endarterectomy; he needs time to recover. He can be reassessed at that time to see if he is a candidate for low-dose beta blocker or ACE inhibitor. At present his blood pressure will not tolerate this. He will follow-up with electrophysiology to have his loop monitor interrogated. We will sign off. Please call with questions. Kirk Ruths

## 2015-06-18 NOTE — Progress Notes (Signed)
STROKE TEAM PROGRESS NOTE   SUBJECTIVE (INTERVAL HISTORY) No family at bedside. Pt with significant expressive aphasia this am when speaking with Dr. Pearlean Brownie.   OBJECTIVE Temp:  [97.7 F (36.5 C)-98.8 F (37.1 C)] 98 F (36.7 C) (10/05 0821) Pulse Rate:  [62-90] 90 (10/05 0730) Cardiac Rhythm:  [-] Normal sinus rhythm;Bundle branch block;Heart block (10/05 0800) Resp:  [9-22] 17 (10/05 0730) BP: (69-125)/(37-69) 114/61 mmHg (10/05 0730) SpO2:  [91 %-100 %] 91 % (10/05 0730) Arterial Line BP: (80-129)/(26-48) 97/38 mmHg (10/04 1941) Weight:  [74.5 kg (164 lb 3.9 oz)] 74.5 kg (164 lb 3.9 oz) (10/04 1800)  CBC:   Recent Labs Lab 06/14/15 1711 06/15/15 0625 06/18/15 0535  WBC 10.4 7.7 8.9  NEUTROABS 7.2  --   --   HGB 15.4 15.4 13.9  HCT 44.3 44.5 40.7  MCV 91.0 90.1 92.1  PLT 212 205 164   Basic Metabolic Panel:   Recent Labs Lab 06/15/15 0625 06/18/15 0535  NA 134* 136  K 4.1 3.9  CL 100* 101  CO2 26 25  GLUCOSE 199* 160*  BUN 12 10  CREATININE 1.20 1.09  CALCIUM 9.2 8.4*    PHYSICAL EXAM Pleasant elderly Caucasian male not in distress. . Afebrile. Head is nontraumatic. Neck is supple without bruit.    Cardiac exam no murmur or gallop. Lungs are clear to auscultation. Distal pulses are well felt. Neurologic Exam  Mental Status: Alert, oriented, thought content appropriate. Mild  Expressive aphasia and dysarthria Able to follow simple commands without difficulty. Cranial Nerves: II: Discs not visualized; Visual fields grossly normal, pupils equal, round, reactive to light and accommodation III,IV, VI: ptosis not present, extra-ocular motions intact bilaterally V,VII: Slight right facial droop, facial light touch sensation normal bilaterally VIII: hearing normal bilaterally IX,X: gag reflex present XI: bilateral shoulder shrug XII: rightward tongue extension Motor: Right :Upper extremity 4/5Left: Upper  extremity 5/5 Lower extremity 5/5Lower extremity 5/5 Tone and bulk:normal tone throughout; no atrophy noted Sensory: Pinprick and light touch intact throughout, bilaterally Cerebellar: Unable to test Gait: Not tested   ASSESSMENT/PLAN [name] is a 74 y.o. 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 He did not receive IV t-PA due to recent stroke and late presentation.  Stroke:  New dominant  Left brain caudate head infarct in setting of bilateral small scattered embolic infarcts earlier in the month, new infarct felt to be secondary to identified left internal carotid artery stenosis.  Resultant  Mild right hemiparesis, transcortical motor aphasia  MRI  multiple bilateral scattered infarcts, more concentrated on the L  MRA  nonvisualized anterior inferior cerebellar arteries  Carotid Doppler 05/27/2015 -  Findings suggest 1-39% right internal carotid artery stenosis and 60-79% left internal carotid artery stenosis. The right vertebral artery exhibits an atypical antegrade waveform. which suggests distal stenosis likely.  TEE - 05/26/2015 - EF 30-35%. No cardiac source of emboli identified  Loop recorder placed 05/26/15 by Dr. Ladona Ridgel - interrogateed - no atrial fibrillation note  LDL 60  HgbA1c 05/25/2015 - 7.5  VTE prophylaxis Diet Carb Modified Fluid consistency:: Thin; Room service appropriate?: Yes  aspirin 325 mg orally every day and clopidogrel 75 mg orally every day prior to admission, now on aspirin 325 mg orally every day and clopidogrel 75 mg orally every day. Recommend continuation of aspirin 81  mg and plavix 75 mg daily at discharge d/t intracranial large vessel disease  L  CEA by Edilia Bo 06/17/2015, doing well  Therapy recommendations: HH OT & PT, ongoing ST  Disposition: anticipate return home  Stroke team will follow while in the hospital  Follow up with Dr. Pearlean Brownie in 2 months, stroke clinic, order written  Hypertension  BP as low as 69/40, dopamaine during the night now off, BP stabilized  Hyperlipidemia  Home meds:  Zocor 40 mg daily  resumed in hospital  LDL 60, goal < 70  Continue statin at discharge  Diabetes type 2  HgbA1c 7.5, goal < 7.0  Uncontrolled  Other Stroke Risk Factors  Advanced age  Cigarette smoker, quit smoking 21 years ago.  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 I have made any additions or clarifications directly to the above note. Mobilize out of bed as tolerated.  Delia Heady, MD Medical Director [facility] Pager: [phone] 06/18/2015 1:52 PM     To contact Stroke Continuity provider, please refer to [email or web address]. After hours, contact General Neurology

## 2015-06-18 NOTE — Progress Notes (Signed)
  Echocardiogram 2D Echocardiogram has been performed.  Lucas Morgan M 06/18/2015, 3:31 PM

## 2015-06-18 NOTE — Progress Notes (Addendum)
PATIENT DETAILS Name: STEPAN VERRETTE Age: 74 y.o. Sex: male Date of Birth: 12-18-1940 Admit Date: 06/14/2015 Admitting Physician Rolly Salter, MD WUJ:[id number], JENNY B, PA-C  Subjective: Transiently hypotensive last night-required dopamine-now discontinued. No chest pain or shortness of breath. Dysarthria and right-sided weakness unchanged  Assessment/Plan: Principal Problem: CVA (cerebral infarction):Current CVA felt to be from symptomatic left carotid artery stenosis.Recent CVA (ON 05/24/15) felt to be embolic.Status post CEA on 10/4.spoke with neurology-Dr. Pearlean Brownie recommends to continue with aspirin and Plavix given intracranial vessel. Continue statin. Postop care deferred to [facility]  Active Problems: Urinary retention:Foley catheter placed after numerous straight cath unsuccessful.Continue Flomax-will require a voiding trial in the next days  Chronic systolic heart failure: EF on recent TEE around 30-35%, clinically compensated-but hypotensive post CVA requiring dopamine infusion. Will ask cardiology- to see if any further investigations required prior to discharge.  Type 2 DM:CBG's stable with Lantus 24 units and SSI-follow and adjust accordingly  Dyslipidemia:continue Statin  Chronic kidney disease stage III: Creatinine close to usual baseline. 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  Chronic pain syndrome:has chronic back pain-prn narcotics  Disposition: Remain inpatient-CIR in next day or so if bed available. Monitor for 1 more day in 3S to ensure BP stable  Antimicrobial agents  See below  Anti-infectives    Start     Dose/Rate Route Frequency Ordered Stop   06/17/15 1900  cefUROXime (ZINACEF) 1.5 g in dextrose 5 % 50 mL  IVPB     1.5 g 100 mL/hr over 30 Minutes Intravenous Every 12 hours 06/17/15 1800 06/18/15 0730   06/17/15 0800  ceFAZolin (ANCEF) IVPB 1 g/50 mL premix  Status:  Discontinued    Comments:  Send with pt to OR   1 g 100 mL/hr over 30 Minutes Intravenous To [facility] 06/16/15 0916 06/17/15 1819   06/17/15 0717  dextrose 5 % with cefUROXime (ZINACEF) ADS Med    Comments:  Roney Mans   : cabinet override      06/17/15 0717 06/17/15 1929   06/17/15 0717  ceFAZolin (ANCEF) 2-3 GM-% IVPB SOLR    Comments:  Roney Mans   : cabinet override      06/17/15 0717 06/17/15 0759      DVT Prophylaxis: Prophylactic Lovenox   Code Status: Full code   Family Communication None at bedside  Procedures: Left CEA 10/4>>  CONSULTS:  neurology and vascular surgery  Time spent 30 minutes-Greater than 50% of this time was spent in counseling, explanation of diagnosis, planning of further management, and coordination of care.  MEDICATIONS: Scheduled Meds: . sodium chloride   Intravenous Once  . aspirin EC  81 mg Oral Daily  . clopidogrel  75 mg Oral Daily  . docusate sodium  100 mg Oral Daily  . enoxaparin (LOVENOX) injection  40 mg Subcutaneous Q24H  . insulin aspart  0-15 Units Subcutaneous TID WC  . insulin aspart  0-5 Units Subcutaneous QHS  . insulin glargine  24 Units Subcutaneous QHS  . pantoprazole  40 mg Oral Daily  . simvastatin  40 mg Oral QHS   Continuous Infusions: . sodium chloride Stopped (06/18/15 0810)  . DOPamine Stopped (06/18/15 0811)   PRN Meds:.sodium chloride,  acetaminophen **OR** acetaminophen, ALPRAZolam, alum & mag hydroxide-simeth, bisacodyl, DOPamine, feeding supplement (GLUCERNA SHAKE), guaiFENesin-dextromethorphan, hydrALAZINE, HYDROcodone-acetaminophen, labetalol, magnesium sulfate 1 - 4 g bolus IVPB, metoprolol, morphine injection, ondansetron, phenol, potassium chloride, traMADol-acetaminophen    PHYSICAL EXAM: Vital signs in last 24  hours: Filed Vitals:   06/18/15 0730 06/18/15 0821 06/18/15 0830 06/18/15 1000  BP: 114/61  106/71 96/55  Pulse: 90  104 99  Temp:  98 F (36.7 C)    TempSrc:  Oral    Resp: 17   19  Height:      Weight:      SpO2: 91%   97%    Weight change:  Filed Weights   06/14/15 2213 06/17/15 1800  Weight: 74.571 kg (164 lb 6.4 oz) 74.5 kg (164 lb 3.9 oz)   Body mass index is 24.24 kg/(m^2).   Gen Exam: Awake and alert,not in any distress Neck: Mild swelling at the left neck area Chest: B/L Clear.   CVS: S1 S2 Regular, no murmurs.  Abdomen: soft, BS +, non tender, non distended.  Extremities: no edema, lower extremities warm to touch. Neurologic: RUW/RLE with minimal right sided weakness Skin: No Rash.   Wounds: N/A.   Intake/Output from previous day:  Intake/Output Summary (Last 24 hours) at 06/18/15 1120 Last data filed at 06/18/15 0811  Gross per 24 hour  Intake 3416.45 ml  Output   4950 ml  Net -1533.55 ml     LAB RESULTS: CBC  Recent Labs Lab 06/14/15 1707 06/14/15 1711 06/15/15 0625 06/18/15 0535  WBC  --  10.4 7.7 8.9  HGB 16.0 15.4 15.4 13.9  HCT 47.0 44.3 44.5 40.7  PLT  --  212 205 164  MCV  --  91.0 90.1 92.1  MCH  --  31.6 31.2 31.4  MCHC  --  34.8 34.6 34.2  RDW  --  13.0 12.9 13.1  LYMPHSABS  --  1.8  --   --   MONOABS  --  1.0  --   --   EOSABS  --  0.4  --   --   BASOSABS  --  0.0  --   --     Chemistries   Recent Labs Lab 06/14/15 1707 06/14/15 1711 06/15/15 0625 06/18/15 0535  NA 137 135 134* 136  K 4.2 4.3 4.1 3.9  CL 101 102 100* 101  CO2  --  24 26 25   GLUCOSE 214* 211* 199* 160*  BUN 18 15 12 10   CREATININE 1.20 1.22 1.20 1.09  CALCIUM  --  9.3 9.2 8.4*    CBG:  Recent Labs Lab 06/17/15 1012 06/17/15 1516 06/17/15 1635 06/17/15 2117 06/18/15 0836  GLUCAP 253* 83 97 201* 190*    GFR Estimated Creatinine Clearance: 60.4 mL/min (by C-G formula based on Cr of 1.09).  Coagulation profile  Recent Labs Lab  06/14/15 1711  INR 1.02    Cardiac Enzymes No results for input(s): CKMB, TROPONINI, MYOGLOBIN in the last 168 hours.  Invalid input(s): CK  Invalid input(s): POCBNP No results for input(s): DDIMER in the last 72 hours. No results for input(s): HGBA1C in the last 72 hours. No results for input(s): CHOL, HDL, LDLCALC, TRIG, CHOLHDL, LDLDIRECT in the last 72 hours. No results for input(s): TSH, T4TOTAL, T3FREE, THYROIDAB in the last 72 hours.  Invalid input(s): FREET3 No results for input(s): VITAMINB12, FOLATE, FERRITIN, TIBC, IRON, RETICCTPCT in the last 72 hours. No results for input(s): LIPASE, AMYLASE in the last 72 hours.  Urine Studies No results for input(s): UHGB, CRYS in the last 72 hours.  Invalid input(s): UACOL, UAPR, USPG, UPH, UTP, UGL, UKET, UBIL, UNIT, UROB, ULEU, UEPI, UWBC, URBC, UBAC, CAST, UCOM, BILUA  MICROBIOLOGY: No results found for this or any previous visit (from the past 240 hour(s)).  RADIOLOGY STUDIES/RESULTS: Dg Chest 2 View  05/25/2015   CLINICAL DATA:  Cough.  Recent stroke.  EXAM: CHEST  2 VIEW  COMPARISON:  10/18/2014  FINDINGS: The patient is mildly rotated to the right which partially limits evaluation of the mediastinum. Cardiomediastinal silhouette is grossly unchanged, with cardiac silhouette upper limits of normal in size. The patient has taken a slightly shallower inspiration than on the prior study. Chronic interstitial coarsening is again seen and may reflect underlying COPD/chronic bronchitis. There is slightly increased heterogeneous opacity in the right lung base which projects posteriorly on the lateral image. No pleural effusion or pneumothorax is identified. Degenerative changes are noted at the [facility] joints.  IMPRESSION: COPD with mild right basilar infiltrate versus atelectasis.   Electronically Signed   By: Sebastian Ache M.D.   On: 05/25/2015 12:17   Dg Abd 1 View  05/30/2015   CLINICAL DATA:  Abdominal pain and tenderness  EXAM: ABDOMEN  - 1 VIEW  COMPARISON:  04/08/2015  FINDINGS: The bowel gas pattern is normal. Moderate stool burden identified throughout the colon. No radio-opaque calculi or other significant radiographic abnormality are seen.  IMPRESSION: 1. Moderate stool burden suggesting constipation.   Electronically Signed   By: Signa Kell M.D.   On: 05/30/2015 08:58   Ct Head Wo Contrast  06/14/2015   CLINICAL DATA:  Acute dizziness for 1 day. Recent diagnosis of left frontal infarct.  EXAM: CT HEAD WITHOUT CONTRAST  TECHNIQUE: Contiguous axial images were obtained from the base of the skull through the vertex without intravenous contrast.  COMPARISON:  05/24/2015 and prior exams.  FINDINGS: A subacute left frontal infarct is again identified with expected evolutionary changes.  Mild chronic small-vessel white matter ischemic changes, mild generalized cerebral volume loss and remote left parietal infarct again noted.  No acute intracranial abnormalities are identified, including mass lesion or mass effect, hydrocephalus, extra-axial fluid collection, midline shift, hemorrhage, or acute infarction.  The visualized bony calvarium is unremarkable.  Mild deformity and calcification of the left globe again noted.  IMPRESSION: No evidence of acute intracranial abnormality.  Evolutionary changes of known subacute left frontal infarct.  Mild atrophy, chronic small-vessel white matter ischemic changes and remote left parietal infarct.   Electronically Signed   By: Harmon Pier M.D.   On: 06/14/2015 19:11   Ct Head Wo Contrast  05/24/2015   CLINICAL DATA:  Code stroke. RIGHT-sided weakness. Symptoms began 4 days ago.  EXAM: CT HEAD WITHOUT CONTRAST  TECHNIQUE: Contiguous axial images were obtained from the base of the skull through the vertex without intravenous contrast.  COMPARISON:  CT head 06/10/2014.  MR brain 06/10/2014.  FINDINGS: Well-defined area of cytotoxic edema affects the LEFT superior frontal cortex and subcortical white  matter consistent with an ischemic event onset 4 days ago. No hemorrhagic transformation. No other areas of acute infarction are suspected.  No CT findings to suggest proximal vascular thrombosis.  Mild atrophy. Chronic LEFT frontal and parietal subcortical infarction related to previous LEFT MCA territory ischemic insult documented on September 2015 MR. An additional LEFT parietal insult predated that stroke.  Hypoattenuation of the periventricular and subcortical regions consistent with small vessel disease.  No mass lesion,  hydrocephalus, or extra-axial fluid.  Calvarium intact.  No sinus or mastoid air fluid level.  IMPRESSION: Well-defined area of cytotoxic edema affecting the LEFT superior frontal cortex and subcortical white matter consistent with a late acute cerebral infarction. No features to suggest proximal vascular occlusion or hemorrhagic transformation.  Chronic changes as described.  Critical Value/emergent results were called by telephone at the time of interpretation on 05/24/2015 at 6:00 pm to Dr. Nelva Nay , who verbally acknowledged these results.   Electronically Signed   By: Elsie Stain M.D.   On: 05/24/2015 18:01   Ct Angio Neck W/cm &/or Wo/cm  05/28/2015   CLINICAL DATA:  Functional deficits secondary to bilateral multi vascular territory infarcts.  EXAM: CT ANGIOGRAPHY NECK  TECHNIQUE: Multidetector CT imaging of the neck was performed using the standard protocol during bolus administration of intravenous contrast. Multiplanar CT image reconstructions and MIPs were obtained to evaluate the vascular anatomy. Carotid stenosis measurements (when applicable) are obtained utilizing NASCET criteria, using the distal internal carotid diameter as the denominator.  CONTRAST:  50mL OMNIPAQUE IOHEXOL 350 MG/ML SOLN  COMPARISON:  MRI of the brain May 25, 2015  FINDINGS: Normal appearance of the thoracic arch, normal branch pattern, mild calcific atherosclerosis. The origins of the  innominate, left Common carotid artery and subclavian artery are widely patent.  Bilateral Common carotid arteries are widely patent, coursing in a straight line fashion. Mild intimal thickening of the distal Common carotid arteries. Calcific atherosclerosis of bilateral carotid bulbs. 15 mm segment of 50% stenosis RIGHT ICA origin. 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 No pseudoaneurysm. Normal appearance of the included internal carotid arteries.  Left vertebral artery is dominant. Normal appearance of the vertebral arteries, which appear widely patent. RIGHT vertebral artery predominately terminates in the posterior inferior cerebellar artery.  No contrast extravasation.  17 mm LEFT thyroid nodule. LEFT parotid sialolith. Subcentimeter radiopaque foreign body within the RIGHT superficial neck subcutaneous fat may represent catheter fragment or other non metallic object. No acute osseous process though bone windows have not been submitted ; severe RIGHT C5-6 neural foraminal narrowing. Mild bronchial wall thickening and patchy upper lobe airspace opacities which could represent atelectasis or infiltrate, biapical bullous changes.  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  50% stenosis RIGHT ICA origin.  17 mm LEFT thyroid nodule for which follow-up dedicated thyroid sonogram is recommended on a nonemergent basis.   Electronically Signed   By: Awilda Metro M.D.   On: 05/28/2015 22:33   Mr Shirlee Latch Wo Contrast  06/14/2015   CLINICAL DATA:  74 year old diabetic male with dyslipidemia and renal insufficiency complaining of dizziness since yesterday. Recent stroke. Subsequent encounter.  EXAM: MRI HEAD WITHOUT  CONTRAST  MRA HEAD WITHOUT CONTRAST  TECHNIQUE: Multiplanar, multiecho pulse sequences of the brain and surrounding structures were obtained without intravenous contrast. Angiographic images of the head were obtained using MRA technique without contrast.  COMPARISON:  06/14/2015 head CT.  05/25/2015 MR.  FINDINGS: MRI HEAD FINDINGS  Exam is motion degraded.  Several subacute infarcts demonstrate expected evolution. Largest subacute infarct left frontal lobe (with mild laminar necrosis). Scattered small subacute infarcts noted bilaterally.  New tiny acute left caudate head infarct.  Remote small caudate infarct.  No  intracranial hemorrhage.  Moderate small vessel disease type changes.  Global atrophy without hydrocephalus.  No intracranial mass lesion noted on this unenhanced exam.  Left phthisis bulbi.  Cervical medullary junction, pituitary region and pineal region unremarkable.  Polypoid opacification anterior aspect sphenoid sinus.  MRA HEAD FINDINGS  Anterior circulation without medium or large size vessel significant stenosis or occlusion.  Middle cerebral artery branch vessel narrowing and irregularity bilaterally.  Right vertebral artery ends in a posterior inferior cerebellar artery distribution. The right posterior inferior cerebellar artery is narrowed/ irregular and poorly delineated.  No significant stenosis left vertebral artery or basilar artery.  Nonvisualized anterior inferior cerebellar arteries.  Mild irregularity of portions of the posterior cerebral artery bilaterally with narrowing most notable involving distal branches.  No aneurysm noted.  IMPRESSION: MRI HEAD  Exam is motion degraded.  New tiny acute left caudate head infarct.  Several subacute infarcts demonstrate expected evolution. Largest subacute infarct left frontal lobe (with mild laminar necrosis). Scattered small subacute infarcts noted bilaterally.  Remote small caudate infarct.  No intracranial hemorrhage.  Moderate small vessel  disease type changes.  Global atrophy without hydrocephalus.  Polypoid opacification anterior aspect sphenoid sinus.  MRA HEAD FINDINGS  Anterior circulation without medium or large size vessel significant stenosis or occlusion.  Middle cerebral artery branch vessel narrowing and irregularity bilaterally.  Right vertebral artery ends in a posterior inferior cerebellar artery distribution. The right posterior inferior cerebellar artery is narrowed/ irregular and poorly delineated.  No significant stenosis left vertebral artery or basilar artery.  Nonvisualized anterior inferior cerebellar arteries.  Mild irregularity of portions of the posterior cerebral artery bilaterally with narrowing most notable involving distal branches.   Electronically Signed   By: Lacy Duverney M.D.   On: 06/14/2015 21:04   Mr Brain Wo Contrast  06/14/2015   CLINICAL DATA:  74 year old diabetic male with dyslipidemia and renal insufficiency complaining of dizziness since yesterday. Recent stroke. Subsequent encounter.  EXAM: MRI HEAD WITHOUT CONTRAST  MRA HEAD WITHOUT CONTRAST  TECHNIQUE: Multiplanar, multiecho pulse sequences of the brain and surrounding structures were obtained without intravenous contrast. Angiographic images of the head were obtained using MRA technique without contrast.  COMPARISON:  06/14/2015 head CT.  05/25/2015 MR.  FINDINGS: MRI HEAD FINDINGS  Exam is motion degraded.  Several subacute infarcts demonstrate expected evolution. Largest subacute infarct left frontal lobe (with mild laminar necrosis). Scattered small subacute infarcts noted bilaterally.  New tiny acute left caudate head infarct.  Remote small caudate infarct.  No intracranial hemorrhage.  Moderate small vessel disease type changes.  Global atrophy without hydrocephalus.  No intracranial mass lesion noted on this unenhanced exam.  Left phthisis bulbi.  Cervical medullary junction, pituitary region and pineal region unremarkable.  Polypoid  opacification anterior aspect sphenoid sinus.  MRA HEAD FINDINGS  Anterior circulation without medium or large size vessel significant stenosis or occlusion.  Middle cerebral artery branch vessel narrowing and irregularity bilaterally.  Right vertebral artery ends in a posterior inferior cerebellar artery distribution. The right posterior inferior cerebellar artery is narrowed/ irregular and poorly delineated.  No significant stenosis left vertebral artery or basilar artery.  Nonvisualized anterior inferior cerebellar arteries.  Mild irregularity of portions of the posterior cerebral artery bilaterally with narrowing most notable involving distal branches.  No aneurysm noted.  IMPRESSION: MRI HEAD  Exam is motion degraded.  New tiny acute left caudate head infarct.  Several subacute infarcts demonstrate expected evolution. Largest subacute infarct left frontal lobe (with mild laminar necrosis).  Scattered small subacute infarcts noted bilaterally.  Remote small caudate infarct.  No intracranial hemorrhage.  Moderate small vessel disease type changes.  Global atrophy without hydrocephalus.  Polypoid opacification anterior aspect sphenoid sinus.  MRA HEAD FINDINGS  Anterior circulation without medium or large size vessel significant stenosis or occlusion.  Middle cerebral artery branch vessel narrowing and irregularity bilaterally.  Right vertebral artery ends in a posterior inferior cerebellar artery distribution. The right posterior inferior cerebellar artery is narrowed/ irregular and poorly delineated.  No significant stenosis left vertebral artery or basilar artery.  Nonvisualized anterior inferior cerebellar arteries.  Mild irregularity of portions of the posterior cerebral artery bilaterally with narrowing most notable involving distal branches.   Electronically Signed   By: Lacy Duverney M.D.   On: 06/14/2015 21:04   Mr Brain Wo Contrast  05/25/2015   CLINICAL DATA:  Recent left-sided infarct with  right-sided weakness and aphasia. The weakness has improved but the aphasia continues. The patient was discharged from an outside hospital 2 days ago.  EXAM: MRI HEAD WITHOUT CONTRAST  MRA HEAD WITHOUT CONTRAST  TECHNIQUE: Multiplanar, multiecho pulse sequences of the brain and surrounding structures were obtained without intravenous contrast. Angiographic images of the head were obtained using MRA technique without contrast.  COMPARISON:  None.  CT head without contrast 05/24/2015.  FINDINGS: MRI HEAD FINDINGS  The acute/subacute cortical and subcortical infarct in the high left frontal lobe is confirmed. This involves the [employer]. Additional scattered foci are present in the frontal lobe and parietal lobe on the left and what appears to be watershed distribution. There is additional involvement of the more superior and medial primary motor cortex. Portions of the inferior parietal and left occipital lobe are noted as well.  Additionally, at least 4 punctate areas of restricted diffusion are present in the right parietal and medial occipital lobe. There is no hemorrhage associated with these lesions. T2 changes are evident within the areas of acute/subacute infarct.  Moderate periventricular and subcortical T2 changes are additionally present bilaterally. Brainstem is unremarkable. Flow is present in the major intracranial arteries.  The study is moderately degraded by patient motion and could not be completed.  The left globe is collapsed with increased signal. The right globe is intact. The orbits are otherwise unremarkable. The paranasal sinuses and mastoid air cells are clear. Midline structures are unremarkable.  MRA HEAD FINDINGS  The internal carotid arteries are within normal limits from the high cervical segments through the ICA termini bilaterally. Moderate narrowing is present in the mid right A1 segment. More mild narrowing is present in the mid left A1 segment. The M1 segments are  intact. The left ACA is duplicated. The anterior communicating artery appears to be patent. The MCA bifurcations are intact bilaterally. There is moderate attenuation of MCA branch vessels distally, right greater than left.  The right vertebral artery terminates at the PICA. The left vertebral artery is within normal limits. The PICA origin is visualized and normal. The basilar artery is within normal limits. Both posterior cerebral arteries originate from the basilar tip. There is some attenuation of distal PCA branch vessels.  IMPRESSION: 1. Acute/subacute nonhemorrhagic infarct involving the left frontal lobe to level of the precentral gyrus. 2. Additional smaller non confluent punctate areas of infarction within the more anterior left frontal lobe, the left parietal lobe, and left occipital lobe seen to follow a watershed distribution. This suggests more proximal disease, potentially in the neck, or and episode of hypotension. 3. At  least 4 punctate foci of acute nonhemorrhagic infarct are noted within the right parietal and occipital lobe. Given the bilateral distribution, a central embolic source also needs to be considered. 4. Age advanced atrophy and diffuse white matter disease. 5. Moderate right and mild left mid A1 segment stenoses. 6. Moderate distal small vessel disease, most evident within the right MCA branch vessels.   Electronically Signed   By: Marin Roberts M.D.   On: 05/25/2015 10:41   Mr Maxine Glenn Head/brain Wo Cm  05/25/2015   CLINICAL DATA:  Recent left-sided infarct with right-sided weakness and aphasia. The weakness has improved but the aphasia continues. The patient was discharged from an outside hospital 2 days ago.  EXAM: MRI HEAD WITHOUT CONTRAST  MRA HEAD WITHOUT CONTRAST  TECHNIQUE: Multiplanar, multiecho pulse sequences of the brain and surrounding structures were obtained without intravenous contrast. Angiographic images of the head were obtained using MRA technique without  contrast.  COMPARISON:  None.  CT head without contrast 05/24/2015.  FINDINGS: MRI HEAD FINDINGS  The acute/subacute cortical and subcortical infarct in the high left frontal lobe is confirmed. This involves the [employer]. Additional scattered foci are present in the frontal lobe and parietal lobe on the left and what appears to be watershed distribution. There is additional involvement of the more superior and medial primary motor cortex. Portions of the inferior parietal and left occipital lobe are noted as well.  Additionally, at least 4 punctate areas of restricted diffusion are present in the right parietal and medial occipital lobe. There is no hemorrhage associated with these lesions. T2 changes are evident within the areas of acute/subacute infarct.  Moderate periventricular and subcortical T2 changes are additionally present bilaterally. Brainstem is unremarkable. Flow is present in the major intracranial arteries.  The study is moderately degraded by patient motion and could not be completed.  The left globe is collapsed with increased signal. The right globe is intact. The orbits are otherwise unremarkable. The paranasal sinuses and mastoid air cells are clear. Midline structures are unremarkable.  MRA HEAD FINDINGS  The internal carotid arteries are within normal limits from the high cervical segments through the ICA termini bilaterally. Moderate narrowing is present in the mid right A1 segment. More mild narrowing is present in the mid left A1 segment. The M1 segments are intact. The left ACA is duplicated. The anterior communicating artery appears to be patent. The MCA bifurcations are intact bilaterally. There is moderate attenuation of MCA branch vessels distally, right greater than left.  The right vertebral artery terminates at the PICA. The left vertebral artery is within normal limits. The PICA origin is visualized and normal. The basilar artery is within normal limits. Both  posterior cerebral arteries originate from the basilar tip. There is some attenuation of distal PCA branch vessels.  IMPRESSION: 1. Acute/subacute nonhemorrhagic infarct involving the left frontal lobe to level of the precentral gyrus. 2. Additional smaller non confluent punctate areas of infarction within the more anterior left frontal lobe, the left parietal lobe, and left occipital lobe seen to follow a watershed distribution. This suggests more proximal disease, potentially in the neck, or and episode of hypotension. 3. At least 4 punctate foci of acute nonhemorrhagic infarct are noted within the right parietal and occipital lobe. Given the bilateral distribution, a central embolic source also needs to be considered. 4. Age advanced atrophy and diffuse white matter disease. 5. Moderate right and mild left mid A1 segment stenoses. 6. Moderate distal small vessel disease,  most evident within the right MCA branch vessels.   Electronically Signed   By: Marin Roberts M.D.   On: 05/25/2015 10:41    Jeoffrey Massed, MD  [facility] Pager:[phone]  If 7PM-7AM, please contact night-coverage [email or web address] Password [email or web address] 06/18/2015, 11:20 AM   LOS: 2 days

## 2015-06-18 NOTE — Progress Notes (Signed)
Received call from patients daughter, she states shes concerned about her dad as she just got off the phone with him and he was crying and in pain. Patient was checked on less than 30 minutes prior, patient was calm and laying in bed, denied any needs. I went back into patients room and asked the patient if anything was wrong. Patient begins to weep, states he "doesnt want to burden" me, the nurse. When questioned further, patient states he is having lower left quadrant pain, having "gas pains" and wanting a enema.   Patient was I&O catherized at 1600 and 500cc was retrieved. Pt given pain medicine, suppository given for aid in moving bowels and bladder scanned performed at 1930. Bladder held almost 900cc. I&O catherization repeated, with night RN at bedside, yielded 800cc.

## 2015-06-18 NOTE — Progress Notes (Signed)
Inpatient Rehabilitation  I spoke briefly  with the patient regarding his potential need for return to IP Rehab.  Pt. with expressive aphasia and was not able to fully discuss.  He was able to give me permission to discuss his rehab needs with his daughters.  I spoke with pt's daughter Liliane Channel [phone]) who confirms that family would like for pt. to return to IP Rehab .  I will initiate insurance authorization process.  Please call if questions.  Weldon Picking PT Inpatient Rehab Admissions Coordinator Cell [phone] Office [phone]

## 2015-06-18 NOTE — Progress Notes (Signed)
OT Cancellation Note  Patient Details Name: Lucas Morgan MRN: [id number] DOB: [birth year]   Cancelled Treatment:    Reason Eval/Treat Not Completed: Fatigue/lethargy limiting ability to participate (Pt had just completed PT, fatigued. Will follow.)  Evern Bio 06/18/2015, 2:26 PM

## 2015-06-18 NOTE — Progress Notes (Signed)
Speech Language Pathology Treatment: Cognitive-Linquistic  Patient Details Name: Lucas Morgan MRN: [id number] DOB: [birth year] Today's Date: 06/18/2015 Time: 0930-1010 SLP Time Calculation (min) (ACUTE ONLY): 40 min  Assessment / Plan / Recommendation Clinical Impression  During aphasia treatment, pt conveyed basic needs/wants using full sentences with moderate cues and modeling from SLP. Pt's comprehension and repetition at the word, phrase, and simple sentence level are intact, confirming transcortical motor aphasia. Pt completed carrier phrases with no cues, SLP provided moderate phonemic cues for completing the sentence tasks. SLP will continue aphasia treatment for functional communication.    HPI Other Pertinent Information: 74 y.o. male with history of diabetes mellitus, hyperlipidemia, coronary artery disease with previous MI, abdominal aortic aneurysm, renal insufficiency, and recent stroke presenting with worsening speech and right hemiparesis. MRI reveals acute/subacute nonhemorrhagic infarct involving the left frontal lobe to level of precentral gyrus; smaller non confluent punctate areas of infarction within the more anterior left frontal lobe, the left parietal lobe, and left occipital lobe seen to follow a watershed distribution; At least 4 punctate foci of acute nonhemorrhagic infarct are noted within the right parietal and occipital lobe.  Recent CIR stay from 9/15-9/27 with signifcant improvements in communication upon D/C; pt was anticipating [facility] therapies post discharge.  Now with plan for left CEA 06/17/15.    Pertinent Vitals Pain Assessment: Faces Faces Pain Scale: No hurt  SLP Plan  Continue with current plan of care    Recommendations Diet recommendations: Regular;Thin liquid Liquids provided via: No straw;Cup Medication Administration: Whole meds with puree Supervision: Patient able to self feed;Intermittent supervision to cue for compensatory strategies Compensations:  Slow rate;Small sips/bites Postural Changes and/or Swallow Maneuvers: Seated upright 90 degrees              Oral Care Recommendations: Oral care BID Follow up Recommendations: Home health SLP Plan: Continue with current plan of care    GO    Lucas Morgan, Student-SLP  Lucas Morgan 06/18/2015, 10:39 AM

## 2015-06-18 NOTE — Progress Notes (Signed)
Physical Therapy Treatment Patient Details Name: Lucas Morgan MRN: [id number] DOB: 14-May-1941 Today's Date: 06/18/2015    History of Present Illness ALLANTE WHITMIRE is a 74 y.o. 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 The patient was discharged home on 10/27 after being admitted for above-mentioned CVA. Family noted that he is having some trouble with ambulating and has complaints of dizziness and therefore he was brought back to the hospital. CT of the head performed in the ER was negative. MRI of the brain revealed a new "tiny" acute left caudate head infarct in several subacute infarcts which demonstrated expected evolution.    PT Comments    Progressing steadily with gait stability though requires support for coordinated gait and is quick to fatigue.   Follow Up Recommendations  CIR     Equipment Recommendations  Rolling walker with 5" wheels    Recommendations for Other Services Rehab consult     Precautions / Restrictions Precautions Precautions: Fall    Mobility  Bed Mobility Overal bed mobility: Needs Assistance Bed Mobility: Supine to Sit;Sit to Supine     Supine to sit: Supervision Sit to supine: Supervision   General bed mobility comments: pt quick to move before assist is ready.  Transfers Overall transfer level: Needs assistance Equipment used: Rolling walker (2 wheeled) Transfers: Sit to/from Stand Sit to Stand: Min assist         General transfer comment: min for safety.  Ambulation/Gait Ambulation/Gait assistance: Min assist;Mod assist Ambulation Distance (Feet): 300 Feet Assistive device: Rolling walker (2 wheeled) Gait Pattern/deviations: Decreased step length - right;Decreased step length - left;Decreased stance time - right;Ataxic;Staggering right Gait velocity: dec Gait velocity interpretation: Below normal speed for  age/gender General Gait Details: overall pt has unsteady R hemiparetic gait, needing frequent truncal  stability and facilitation to w/shift appropriately,  advance R LE and then shift weight to R and maintain to advance L LE.  As fatigue set in gait pattern degraded further needing occasional moderate support.   Stairs            Wheelchair Mobility    Modified Rankin (Stroke Patients Only) Modified Rankin (Stroke Patients Only) Pre-Morbid Rankin Score: Moderately severe disability Modified Rankin: Moderately severe disability     Balance Overall balance assessment: Needs assistance Sitting-balance support: No upper extremity supported;Feet supported Sitting balance-Leahy Scale: Fair     Standing balance support: During functional activity;Bilateral upper extremity supported Standing balance-Leahy Scale: Poor Standing balance comment: reliance on the RW                    Cognition Arousal/Alertness: Awake/alert Behavior During Therapy: WFL for tasks assessed/performed (mild impulsivity.) Overall Cognitive Status: Within Functional Limits for tasks assessed Area of Impairment: Safety/judgement;Awareness   Current Attention Level: Selective   Following Commands: Follows one step commands consistently Safety/Judgement: Decreased awareness of safety;Decreased awareness of deficits          Exercises      General Comments General comments (skin integrity, edema, etc.): Difficulty expressing himself, but followed commands well.      Pertinent Vitals/Pain Pain Assessment: Faces Faces Pain Scale: No hurt    Home Living                      Prior Function            PT Goals (current goals can now be found  in the care plan section) Acute Rehab PT Goals Patient Stated Goal: Feel better PT Goal Formulation: With patient/family Time For Goal Achievement: 06/29/15 Potential to Achieve Goals: Good Progress towards PT goals: Progressing toward  goals    Frequency  Min 4X/week    PT Plan Current plan remains appropriate    Co-evaluation             End of Session   Activity Tolerance: Patient tolerated treatment well Patient left: in bed;with call bell/phone within reach;with bed alarm set     Time: 1350-1408 PT Time Calculation (min) (ACUTE ONLY): 18 min  Charges:  $Gait Training: 8-22 mins                    G Codes:      Dennison Mcdaid, Eliseo Gum 06/18/2015, 4:57 PM 06/18/2015  Dover Bing, PT [phone]  (pager)

## 2015-06-18 NOTE — Progress Notes (Signed)
  [facility] Progress Note  06/18/2015 12:17 PM 1 Day Post-Op  Subjective:  No complaints.    Filed Vitals:   06/18/15 1118  BP: 96/55  Pulse: 95  Temp: 97.4 F (36.3 C)  Resp: 21     Physical Exam: Neuro:  Expressive aphasia unchanged. Mild right tongue deviation. 4/5 strength right upper extremity. 5/5 left upper and lower extremities b/l Incision:  Left neck incision intact. No hematoma.   CBC    Component Value Date/Time   WBC 8.9 06/18/2015 0535   RBC 4.42 06/18/2015 0535   HGB 13.9 06/18/2015 0535   HCT 40.7 06/18/2015 0535   PLT 164 06/18/2015 0535   MCV 92.1 06/18/2015 0535   MCH 31.4 06/18/2015 0535   MCHC 34.2 06/18/2015 0535   RDW 13.1 06/18/2015 0535   LYMPHSABS 1.8 06/14/2015 1711   MONOABS 1.0 06/14/2015 1711   EOSABS 0.4 06/14/2015 1711   BASOSABS 0.0 06/14/2015 1711    BMET    Component Value Date/Time   NA 136 06/18/2015 0535   K 3.9 06/18/2015 0535   CL 101 06/18/2015 0535   CO2 25 06/18/2015 0535   GLUCOSE 160* 06/18/2015 0535   BUN 10 06/18/2015 0535   CREATININE 1.09 06/18/2015 0535   CALCIUM 8.4* 06/18/2015 0535   GFRNONAA >60 06/18/2015 0535   GFRAA >60 06/18/2015 0535     Intake/Output Summary (Last 24 hours) at 06/18/15 1217 Last data filed at 06/18/15 1118  Gross per 24 hour  Intake 3416.45 ml  Output   5000 ml  Net -1583.55 ml      Assessment/Plan:  This is a 74 y.o. male who is s/p left CEA 1 Day Post-Op  -Required dopamine overnight. Off this am. Currently SBP 100s. Asymptomatic.  -Right upper extremity weakness and expressive aphasia unchanged.  -Stable from vascular standpoint.  -Continue ASA and plavix and statin.    Maris Berger, PA-C [facility] Office: [phone] Pager: [phone] 06/18/2015 12:17 PM

## 2015-06-19 DIAGNOSIS — G894 Chronic pain syndrome: Secondary | ICD-10-CM

## 2015-06-19 DIAGNOSIS — I639 Cerebral infarction, unspecified: Secondary | ICD-10-CM

## 2015-06-19 LAB — GLUCOSE, CAPILLARY
GLUCOSE-CAPILLARY: 190 mg/dL — AB (ref 65–99)
GLUCOSE-CAPILLARY: 224 mg/dL — AB (ref 65–99)

## 2015-06-19 MED ORDER — TAMSULOSIN HCL 0.4 MG PO CAPS
0.4000 mg | ORAL_CAPSULE | Freq: Every day | ORAL | Status: DC
  Administered 2015-06-19: 0.4 mg via ORAL
  Filled 2015-06-19: qty 1

## 2015-06-19 MED ORDER — INSULIN GLARGINE 100 UNIT/ML ~~LOC~~ SOLN
28.0000 [IU] | Freq: Every day | SUBCUTANEOUS | Status: DC
  Filled 2015-06-19: qty 0.28

## 2015-06-19 MED ORDER — ALPRAZOLAM 0.5 MG PO TABS: 0.5000 mg | ORAL_TABLET | Freq: Two times a day (BID) | ORAL | Status: DC

## 2015-06-19 MED ORDER — ASPIRIN 81 MG PO TABS: 81.0000 mg | ORAL_TABLET | Freq: Every day | ORAL | Status: AC

## 2015-06-19 MED ORDER — GLUCERNA SHAKE PO LIQD: 237.0000 mL | Freq: Every day | ORAL | Status: DC | PRN

## 2015-06-19 MED ORDER — PANTOPRAZOLE SODIUM 40 MG PO TBEC: 40.0000 mg | DELAYED_RELEASE_TABLET | Freq: Every day | ORAL | Status: DC

## 2015-06-19 MED ORDER — CARVEDILOL 3.125 MG PO TABS
3.1250 mg | ORAL_TABLET | Freq: Two times a day (BID) | ORAL | Status: DC
  Filled 2015-06-19 (×2): qty 1

## 2015-06-19 MED ORDER — LANTUS SOLOSTAR 100 UNIT/ML ~~LOC~~ SOPN: 28.0000 [IU] | PEN_INJECTOR | Freq: Every day | SUBCUTANEOUS | Status: DC

## 2015-06-19 MED ORDER — FLEET ENEMA 7-19 GM/118ML RE ENEM
1.0000 | ENEMA | Freq: Once | RECTAL | Status: AC
  Administered 2015-06-19: 1 via RECTAL
  Filled 2015-06-19: qty 1

## 2015-06-19 MED ORDER — TAMSULOSIN HCL 0.4 MG PO CAPS: 0.4000 mg | ORAL_CAPSULE | Freq: Every day | ORAL | Status: DC

## 2015-06-19 MED ORDER — CARVEDILOL 3.125 MG PO TABS: 3.1250 mg | ORAL_TABLET | Freq: Two times a day (BID) | ORAL | Status: DC

## 2015-06-19 MED ORDER — RESOURCE THICKENUP CLEAR PO POWD
ORAL | Status: DC | PRN
  Filled 2015-06-19: qty 125

## 2015-06-19 MED ORDER — HYDROCODONE-ACETAMINOPHEN 10-325 MG PO TABS: 1.0000 | ORAL_TABLET | Freq: Four times a day (QID) | ORAL | Status: DC | PRN

## 2015-06-19 MED ORDER — CLOPIDOGREL BISULFATE 75 MG PO TABS
75.0000 mg | ORAL_TABLET | Freq: Every day | ORAL | Status: AC
Start: 2015-06-19 — End: ?

## 2015-06-19 NOTE — Discharge Instructions (Signed)
Follow with Primary MD  Abran Richard, PA-C  and other consultant as instructed your Hospitalist MD  Please ask your primary care practitioner to follow-up on left thyroid nodule  Please also ask your primary care practitioner and to follow-up on abdominal aortic aneurysm  Please get a complete blood count and chemistry panel checked by your Primary MD at your next visit, and again as instructed by your Primary MD.  You had Congestive Heart Failure: Please call your Cardiologist or Primary MD-Anytime you have any of the following symptoms: 1) 3 pound weight gain in 24 hours or 5 pounds in 1 week 2) shortness of breath, with or without a dry hacking cough 3) swelling in the hands, feet or stomach 4) if you have to sleep on extra pillows at night in order to breathe Follow cardiac low salt diet and 1.5 lit/day fluid restriction.  Get Medicines reviewed and adjusted. Please take all your medications with you for your next visit with your Primary MD  Please request your Primary MD to go over all hospital tests and procedure/radiological results at the follow up, please ask your Primary MD to get all Hospital records sent to his/her office.  If you experience worsening of your admission symptoms, develop shortness of breath, life threatening emergency, suicidal or homicidal thoughts you must seek medical attention immediately by calling 911 or calling your MD immediately  if symptoms less severe.  You must read complete instructions/literature along with all the possible adverse reactions/side effects for all the Medicines you take and that have been prescribed to you. Take any new Medicines after you have completely understood and accpet all the possible adverse reactions/side effects.   Do not drive when taking Pain medications.   Do not take more than prescribed Pain, Sleep and Anxiety Medications  Special Instructions: If you have smoked or chewed Tobacco  in the last 2 yrs please stop  smoking, stop any regular Alcohol  and or any Recreational drug use.  Wear Seat belts while driving.  Please note  You were cared for by a hospitalist during your hospital stay. Once you are discharged, your primary care physician will handle any further medical issues. Please note that NO REFILLS for any discharge medications will be authorized once you are discharged, as it is imperative that you return to your primary care physician (or establish a relationship with a primary care physician if you do not have one) for your aftercare needs so that they can reassess your need for medications and monitor your lab values.

## 2015-06-19 NOTE — Discharge Summary (Addendum)
PATIENT DETAILS Name: Lucas Morgan Age: 74 y.o. Sex: male Date of Birth: Oct 14, 1940 MRN: [id number]. Admitting Physician: Rolly Salter, MD [id number], Shaune Pollack, PA-C  Admit Date: 06/14/2015 Discharge date: 06/19/2015  Recommendations for Outpatient Follow-up:  1. Please ensure follow-up with cardiology-newly diagnosed congestive heart failure. 2. Please ensure follow-up with vascular surgery-Left carotid endarterectomy 3. Please ensure further workup regarding left thyroid gland nodule 4. Please continue outpatient surveillance of abdominal aortic aneurysm 5. Please ensure follow-up with urology for outpatient voiding trial-patient started on Flomax and discharged with Foley catheter 6. 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 Diabetes mellitus   . Dyslipidemia   . MI (myocardial infarction) (HCC) 11/09/2008    2.5 x 23 Xience V DES to the CFX  . AAA (abdominal aortic aneurysm) (HCC)     a. Abd U/S 7/14: mild aneurysmal dilatation 3x3 cm; cholelithiasis without evid of cholecystitis => repeat 1 year  . Coronary artery disease   . Arthritis     stenosis, lumbar region  . Renal insufficiency   . Stroke [facility])     DISCHARGE MEDICATIONS: Current Discharge Medication List    START taking these medications   Details  carvedilol (COREG) 3.125 MG tablet Take 1 tablet (3.125 mg total) by mouth 2 (two) times daily with a meal. Qty: 60 tablet, Refills: 0    feeding supplement, GLUCERNA SHAKE, (GLUCERNA SHAKE) LIQD Take 237 mLs by mouth daily as needed (Please offer it patient consumes less than 50% of  meal). Qty: 60 Can, Refills: 0    pantoprazole (PROTONIX) 40 MG tablet Take 1 tablet (40 mg total) by mouth daily. Qty: 30 tablet, Refills: 0    tamsulosin (FLOMAX) 0.4 MG CAPS capsule Take 1 capsule (0.4 mg total) by mouth daily. Qty: 30 capsule, Refills: 0      CONTINUE these medications which have CHANGED   Details  ALPRAZolam (XANAX) 0.5 MG tablet Take 1 tablet (0.5 mg total) by mouth 2 (two) times daily. Qty: 20 tablet, Refills: 0    aspirin 81 MG tablet Take 1 tablet (81 mg total) by mouth daily.    clopidogrel (PLAVIX) 75 MG tablet Take 1 tablet (75 mg total) by mouth daily. Qty: 30 tablet, Refills: 0    HYDROcodone-acetaminophen (NORCO) 10-325 MG tablet Take 1 tablet by mouth every 6 (six) hours as needed (pain). Qty: 30 tablet, Refills: 0    LANTUS SOLOSTAR 100 UNIT/ML Solostar Pen Inject 28 Units into the skin at bedtime.      CONTINUE these medications which have NOT CHANGED   Details  glipiZIDE (GLUCOTROL) 10 MG tablet Take 0.5 tablets (5 mg total) by mouth daily before breakfast. Qty: 15 tablet, Refills: 1    Menthol-Methyl Salicylate (MUSCLE RUB) 10-15 % CREA Apply 1 application topically 2 (two) times daily as needed for muscle pain. Qty: 113 g, Refills: 0    mirtazapine (REMERON) 7.5 MG tablet Take 1 tablet (7.5 mg total) by mouth at bedtime. For depression/sleep Qty: 30 tablet, Refills: 1    senna-docusate (SENOKOT-S) 8.6-50 MG tablet Take 1 tablet by mouth 2 (  two) times daily. Qty: 60 tablet, Refills: 0    simvastatin (ZOCOR) 40 MG tablet Take 40 mg by mouth at bedtime. Refills: 6        ALLERGIES:   Allergies  Allergen Reactions  . Trazodone And Nefazodone Other (See Comments)    High blood sugar    BRIEF HPI:  See H&P, Labs, Consult and Test reports for all details in brief, patient is a 74 year old male with recent history of CVA who was just discharged from this hospital, readmitted with difficulty ambulating. Further workup revealed acute  CVA. See below for further details  CONSULTATIONS:   cardiology, neurology and vascular surgery  PERTINENT RADIOLOGIC STUDIES: Dg Chest 2 View  05/25/2015   CLINICAL DATA:  Cough.  Recent stroke.  EXAM: CHEST  2 VIEW  COMPARISON:  10/18/2014  FINDINGS: The patient is mildly rotated to the right which partially limits evaluation of the mediastinum. Cardiomediastinal silhouette is grossly unchanged, with cardiac silhouette upper limits of normal in size. The patient has taken a slightly shallower inspiration than on the prior study. Chronic interstitial coarsening is again seen and may reflect underlying COPD/chronic bronchitis. There is slightly increased heterogeneous opacity in the right lung base which projects posteriorly on the lateral image. No pleural effusion or pneumothorax is identified. Degenerative changes are noted at the [facility] joints.  IMPRESSION: COPD with mild right basilar infiltrate versus atelectasis.   Electronically Signed   By: Sebastian Ache M.D.   On: 05/25/2015 12:17   Dg Abd 1 View  05/30/2015   CLINICAL DATA:  Abdominal pain and tenderness  EXAM: ABDOMEN - 1 VIEW  COMPARISON:  04/08/2015  FINDINGS: The bowel gas pattern is normal. Moderate stool burden identified throughout the colon. No radio-opaque calculi or other significant radiographic abnormality are seen.  IMPRESSION: 1. Moderate stool burden suggesting constipation.   Electronically Signed   By: Signa Kell M.D.   On: 05/30/2015 08:58   Ct Head Wo Contrast  06/14/2015   CLINICAL DATA:  Acute dizziness for 1 day. Recent diagnosis of left frontal infarct.  EXAM: CT HEAD WITHOUT CONTRAST  TECHNIQUE: Contiguous axial images were obtained from the base of the skull through the vertex without intravenous contrast.  COMPARISON:  05/24/2015 and prior exams.  FINDINGS: A subacute left frontal infarct is again identified with expected evolutionary changes.  Mild chronic small-vessel white matter ischemic changes, mild generalized  cerebral volume loss and remote left parietal infarct again noted.  No acute intracranial abnormalities are identified, including mass lesion or mass effect, hydrocephalus, extra-axial fluid collection, midline shift, hemorrhage, or acute infarction.  The visualized bony calvarium is unremarkable.  Mild deformity and calcification of the left globe again noted.  IMPRESSION: No evidence of acute intracranial abnormality.  Evolutionary changes of known subacute left frontal infarct.  Mild atrophy, chronic small-vessel white matter ischemic changes and remote left parietal infarct.   Electronically Signed   By: Harmon Pier M.D.   On: 06/14/2015 19:11   Ct Head Wo Contrast  05/24/2015   CLINICAL DATA:  Code stroke. RIGHT-sided weakness. Symptoms began 4 days ago.  EXAM: CT HEAD WITHOUT CONTRAST  TECHNIQUE: Contiguous axial images were obtained from the base of the skull through the vertex without intravenous contrast.  COMPARISON:  CT head 06/10/2014.  MR brain 06/10/2014.  FINDINGS: Well-defined area of cytotoxic edema affects the LEFT superior frontal cortex and subcortical white matter consistent with an ischemic event onset 4 days ago. No hemorrhagic transformation. No other areas  of acute infarction are suspected.  No CT findings to suggest proximal vascular thrombosis.  Mild atrophy. Chronic LEFT frontal and parietal subcortical infarction related to previous LEFT MCA territory ischemic insult documented on September 2015 MR. An additional LEFT parietal insult predated that stroke.  Hypoattenuation of the periventricular and subcortical regions consistent with small vessel disease.  No mass lesion, hydrocephalus, or extra-axial fluid.  Calvarium intact.  No sinus or mastoid air fluid level.  IMPRESSION: Well-defined area of cytotoxic edema affecting the LEFT superior frontal cortex and subcortical white matter consistent with a late acute cerebral infarction. No features to suggest proximal vascular occlusion  or hemorrhagic transformation.  Chronic changes as described.  Critical Value/emergent results were called by telephone at the time of interpretation on 05/24/2015 at 6:00 pm to Dr. Nelva Nay , who verbally acknowledged these results.   Electronically Signed   By: Elsie Stain M.D.   On: 05/24/2015 18:01   Ct Angio Neck W/cm &/or Wo/cm  05/28/2015   CLINICAL DATA:  Functional deficits secondary to bilateral multi vascular territory infarcts.  EXAM: CT ANGIOGRAPHY NECK  TECHNIQUE: Multidetector CT imaging of the neck was performed using the standard protocol during bolus administration of intravenous contrast. Multiplanar CT image reconstructions and MIPs were obtained to evaluate the vascular anatomy. Carotid stenosis measurements (when applicable) are obtained utilizing NASCET criteria, using the distal internal carotid diameter as the denominator.  CONTRAST:  50mL OMNIPAQUE IOHEXOL 350 MG/ML SOLN  COMPARISON:  MRI of the brain May 25, 2015  FINDINGS: Normal appearance of the thoracic arch, normal branch pattern, mild calcific atherosclerosis. The origins of the innominate, left Common carotid artery and subclavian artery are widely patent.  Bilateral Common carotid arteries are widely patent, coursing in a straight line fashion. Mild intimal thickening of the distal Common carotid arteries. Calcific atherosclerosis of bilateral carotid bulbs. 15 mm segment of 50% stenosis RIGHT ICA origin. 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 No pseudoaneurysm. Normal appearance of the included internal carotid arteries.  Left vertebral artery is dominant. Normal appearance of the vertebral arteries, which appear widely patent. RIGHT vertebral artery predominately terminates in the posterior inferior cerebellar artery.  No contrast extravasation.  17 mm LEFT  thyroid nodule. LEFT parotid sialolith. Subcentimeter radiopaque foreign body within the RIGHT superficial neck subcutaneous fat may represent catheter fragment or other non metallic object. No acute osseous process though bone windows have not been submitted ; severe RIGHT C5-6 neural foraminal narrowing. Mild bronchial wall thickening and patchy upper lobe airspace opacities which could represent atelectasis or infiltrate, biapical bullous changes.  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  50% stenosis RIGHT ICA origin.  17 mm LEFT thyroid nodule for which follow-up dedicated thyroid sonogram is recommended on a nonemergent basis.   Electronically Signed   By: Awilda Metro M.D.   On: 05/28/2015 22:33   Mr Shirlee Latch Wo Contrast  06/14/2015   CLINICAL DATA:  74 year old diabetic male with dyslipidemia and renal insufficiency complaining of dizziness since yesterday. Recent stroke. Subsequent encounter.  EXAM: MRI HEAD WITHOUT CONTRAST  MRA HEAD WITHOUT CONTRAST  TECHNIQUE: Multiplanar, multiecho pulse sequences of the brain and surrounding structures were obtained without intravenous contrast. Angiographic images of the head were obtained  using MRA technique without contrast.  COMPARISON:  06/14/2015 head CT.  05/25/2015 MR.  FINDINGS: MRI HEAD FINDINGS  Exam is motion degraded.  Several subacute infarcts demonstrate expected evolution. Largest subacute infarct left frontal lobe (with mild laminar necrosis). Scattered small subacute infarcts noted bilaterally.  New tiny acute left caudate head infarct.  Remote small caudate infarct.  No intracranial hemorrhage.  Moderate small vessel disease type changes.  Global atrophy without hydrocephalus.  No intracranial mass lesion noted on this unenhanced exam.  Left phthisis bulbi.  Cervical medullary junction, pituitary  region and pineal region unremarkable.  Polypoid opacification anterior aspect sphenoid sinus.  MRA HEAD FINDINGS  Anterior circulation without medium or large size vessel significant stenosis or occlusion.  Middle cerebral artery branch vessel narrowing and irregularity bilaterally.  Right vertebral artery ends in a posterior inferior cerebellar artery distribution. The right posterior inferior cerebellar artery is narrowed/ irregular and poorly delineated.  No significant stenosis left vertebral artery or basilar artery.  Nonvisualized anterior inferior cerebellar arteries.  Mild irregularity of portions of the posterior cerebral artery bilaterally with narrowing most notable involving distal branches.  No aneurysm noted.  IMPRESSION: MRI HEAD  Exam is motion degraded.  New tiny acute left caudate head infarct.  Several subacute infarcts demonstrate expected evolution. Largest subacute infarct left frontal lobe (with mild laminar necrosis). Scattered small subacute infarcts noted bilaterally.  Remote small caudate infarct.  No intracranial hemorrhage.  Moderate small vessel disease type changes.  Global atrophy without hydrocephalus.  Polypoid opacification anterior aspect sphenoid sinus.  MRA HEAD FINDINGS  Anterior circulation without medium or large size vessel significant stenosis or occlusion.  Middle cerebral artery branch vessel narrowing and irregularity bilaterally.  Right vertebral artery ends in a posterior inferior cerebellar artery distribution. The right posterior inferior cerebellar artery is narrowed/ irregular and poorly delineated.  No significant stenosis left vertebral artery or basilar artery.  Nonvisualized anterior inferior cerebellar arteries.  Mild irregularity of portions of the posterior cerebral artery bilaterally with narrowing most notable involving distal branches.   Electronically Signed   By: Lacy Duverney M.D.   On: 06/14/2015 21:04   Mr Brain Wo Contrast  06/14/2015   CLINICAL  DATA:  74 year old diabetic male with dyslipidemia and renal insufficiency complaining of dizziness since yesterday. Recent stroke. Subsequent encounter.  EXAM: MRI HEAD WITHOUT CONTRAST  MRA HEAD WITHOUT CONTRAST  TECHNIQUE: Multiplanar, multiecho pulse sequences of the brain and surrounding structures were obtained without intravenous contrast. Angiographic images of the head were obtained using MRA technique without contrast.  COMPARISON:  06/14/2015 head CT.  05/25/2015 MR.  FINDINGS: MRI HEAD FINDINGS  Exam is motion degraded.  Several subacute infarcts demonstrate expected evolution. Largest subacute infarct left frontal lobe (with mild laminar necrosis). Scattered small subacute infarcts noted bilaterally.  New tiny acute left caudate head infarct.  Remote small caudate infarct.  No intracranial hemorrhage.  Moderate small vessel disease type changes.  Global atrophy without hydrocephalus.  No intracranial mass lesion noted on this unenhanced exam.  Left phthisis bulbi.  Cervical medullary junction, pituitary region and pineal region unremarkable.  Polypoid opacification anterior aspect sphenoid sinus.  MRA HEAD FINDINGS  Anterior circulation without medium or large size vessel significant stenosis or occlusion.  Middle cerebral artery branch vessel narrowing and irregularity bilaterally.  Right vertebral artery ends in a posterior inferior cerebellar artery distribution. The right posterior inferior cerebellar artery is narrowed/ irregular and poorly delineated.  No significant stenosis left vertebral artery or basilar artery.  Nonvisualized anterior inferior cerebellar arteries.  Mild irregularity of portions of the posterior cerebral artery bilaterally with narrowing most notable involving distal branches.  No aneurysm noted.  IMPRESSION: MRI HEAD  Exam is motion degraded.  New tiny acute left caudate head infarct.  Several subacute infarcts demonstrate expected evolution. Largest subacute infarct left  frontal lobe (with mild laminar necrosis). Scattered small subacute infarcts noted bilaterally.  Remote small caudate infarct.  No intracranial hemorrhage.  Moderate small vessel disease type changes.  Global atrophy without hydrocephalus.  Polypoid opacification anterior aspect sphenoid sinus.  MRA HEAD FINDINGS  Anterior circulation without medium or large size vessel significant stenosis or occlusion.  Middle cerebral artery branch vessel narrowing and irregularity bilaterally.  Right vertebral artery ends in a posterior inferior cerebellar artery distribution. The right posterior inferior cerebellar artery is narrowed/ irregular and poorly delineated.  No significant stenosis left vertebral artery or basilar artery.  Nonvisualized anterior inferior cerebellar arteries.  Mild irregularity of portions of the posterior cerebral artery bilaterally with narrowing most notable involving distal branches.   Electronically Signed   By: Lacy Duverney M.D.   On: 06/14/2015 21:04   Mr Brain Wo Contrast  05/25/2015   CLINICAL DATA:  Recent left-sided infarct with right-sided weakness and aphasia. The weakness has improved but the aphasia continues. The patient was discharged from an outside hospital 2 days ago.  EXAM: MRI HEAD WITHOUT CONTRAST  MRA HEAD WITHOUT CONTRAST  TECHNIQUE: Multiplanar, multiecho pulse sequences of the brain and surrounding structures were obtained without intravenous contrast. Angiographic images of the head were obtained using MRA technique without contrast.  COMPARISON:  None.  CT head without contrast 05/24/2015.  FINDINGS: MRI HEAD FINDINGS  The acute/subacute cortical and subcortical infarct in the high left frontal lobe is confirmed. This involves the [employer]. Additional scattered foci are present in the frontal lobe and parietal lobe on the left and what appears to be watershed distribution. There is additional involvement of the more superior and medial primary motor  cortex. Portions of the inferior parietal and left occipital lobe are noted as well.  Additionally, at least 4 punctate areas of restricted diffusion are present in the right parietal and medial occipital lobe. There is no hemorrhage associated with these lesions. T2 changes are evident within the areas of acute/subacute infarct.  Moderate periventricular and subcortical T2 changes are additionally present bilaterally. Brainstem is unremarkable. Flow is present in the major intracranial arteries.  The study is moderately degraded by patient motion and could not be completed.  The left globe is collapsed with increased signal. The right globe is intact. The orbits are otherwise unremarkable. The paranasal sinuses and mastoid air cells are clear. Midline structures are unremarkable.  MRA HEAD FINDINGS  The internal carotid arteries are within normal limits from the high cervical segments through the ICA termini bilaterally. Moderate narrowing is present in the mid right A1 segment. More mild narrowing is present in the mid left A1 segment. The M1 segments are intact. The left ACA is duplicated. The anterior communicating artery appears to be patent. The MCA bifurcations are intact bilaterally. There is moderate attenuation of MCA branch vessels distally, right greater than left.  The right vertebral artery terminates at the PICA. The left vertebral artery is within normal limits. The PICA origin is visualized and normal. The basilar artery is within normal limits. Both posterior cerebral arteries originate from the basilar tip. There is some attenuation of distal PCA branch vessels.  IMPRESSION:  1. Acute/subacute nonhemorrhagic infarct involving the left frontal lobe to level of the precentral gyrus. 2. Additional smaller non confluent punctate areas of infarction within the more anterior left frontal lobe, the left parietal lobe, and left occipital lobe seen to follow a watershed distribution. This suggests more  proximal disease, potentially in the neck, or and episode of hypotension. 3. At least 4 punctate foci of acute nonhemorrhagic infarct are noted within the right parietal and occipital lobe. Given the bilateral distribution, a central embolic source also needs to be considered. 4. Age advanced atrophy and diffuse white matter disease. 5. Moderate right and mild left mid A1 segment stenoses. 6. Moderate distal small vessel disease, most evident within the right MCA branch vessels.   Electronically Signed   By: Marin Roberts M.D.   On: 05/25/2015 10:41   Mr Maxine Glenn Head/brain Wo Cm  05/25/2015   CLINICAL DATA:  Recent left-sided infarct with right-sided weakness and aphasia. The weakness has improved but the aphasia continues. The patient was discharged from an outside hospital 2 days ago.  EXAM: MRI HEAD WITHOUT CONTRAST  MRA HEAD WITHOUT CONTRAST  TECHNIQUE: Multiplanar, multiecho pulse sequences of the brain and surrounding structures were obtained without intravenous contrast. Angiographic images of the head were obtained using MRA technique without contrast.  COMPARISON:  None.  CT head without contrast 05/24/2015.  FINDINGS: MRI HEAD FINDINGS  The acute/subacute cortical and subcortical infarct in the high left frontal lobe is confirmed. This involves the [employer]. Additional scattered foci are present in the frontal lobe and parietal lobe on the left and what appears to be watershed distribution. There is additional involvement of the more superior and medial primary motor cortex. Portions of the inferior parietal and left occipital lobe are noted as well.  Additionally, at least 4 punctate areas of restricted diffusion are present in the right parietal and medial occipital lobe. There is no hemorrhage associated with these lesions. T2 changes are evident within the areas of acute/subacute infarct.  Moderate periventricular and subcortical T2 changes are additionally present bilaterally.  Brainstem is unremarkable. Flow is present in the major intracranial arteries.  The study is moderately degraded by patient motion and could not be completed.  The left globe is collapsed with increased signal. The right globe is intact. The orbits are otherwise unremarkable. The paranasal sinuses and mastoid air cells are clear. Midline structures are unremarkable.  MRA HEAD FINDINGS  The internal carotid arteries are within normal limits from the high cervical segments through the ICA termini bilaterally. Moderate narrowing is present in the mid right A1 segment. More mild narrowing is present in the mid left A1 segment. The M1 segments are intact. The left ACA is duplicated. The anterior communicating artery appears to be patent. The MCA bifurcations are intact bilaterally. There is moderate attenuation of MCA branch vessels distally, right greater than left.  The right vertebral artery terminates at the PICA. The left vertebral artery is within normal limits. The PICA origin is visualized and normal. The basilar artery is within normal limits. Both posterior cerebral arteries originate from the basilar tip. There is some attenuation of distal PCA branch vessels.  IMPRESSION: 1. Acute/subacute nonhemorrhagic infarct involving the left frontal lobe to level of the precentral gyrus. 2. Additional smaller non confluent punctate areas of infarction within the more anterior left frontal lobe, the left parietal lobe, and left occipital lobe seen to follow a watershed distribution. This suggests more proximal disease, potentially in the neck, or  and episode of hypotension. 3. At least 4 punctate foci of acute nonhemorrhagic infarct are noted within the right parietal and occipital lobe. Given the bilateral distribution, a central embolic source also needs to be considered. 4. Age advanced atrophy and diffuse white matter disease. 5. Moderate right and mild left mid A1 segment stenoses. 6. Moderate distal small vessel  disease, most evident within the right MCA branch vessels.   Electronically Signed   By: Marin Roberts M.D.   On: 05/25/2015 10:41     PERTINENT LAB RESULTS: CBC:  Recent Labs  06/18/15 0535  WBC 8.9  HGB 13.9  HCT 40.7  PLT 164   CMET CMP     Component Value Date/Time   NA 136 06/18/2015 0535   K 3.9 06/18/2015 0535   CL 101 06/18/2015 0535   CO2 25 06/18/2015 0535   GLUCOSE 160* 06/18/2015 0535   BUN 10 06/18/2015 0535   CREATININE 1.09 06/18/2015 0535   CALCIUM 8.4* 06/18/2015 0535   PROT 7.1 06/15/2015 0625   ALBUMIN 3.1* 06/15/2015 0625   AST 19 06/15/2015 0625   ALT 20 06/15/2015 0625   ALKPHOS 54 06/15/2015 0625   BILITOT 0.7 06/15/2015 0625   GFRNONAA >60 06/18/2015 0535   GFRAA >60 06/18/2015 0535    GFR Estimated Creatinine Clearance: 60.4 mL/min (by C-G formula based on Cr of 1.09). No results for input(s): LIPASE, AMYLASE in the last 72 hours. No results for input(s): CKTOTAL, CKMB, CKMBINDEX, TROPONINI in the last 72 hours. Invalid input(s): POCBNP No results for input(s): DDIMER in the last 72 hours. No results for input(s): HGBA1C in the last 72 hours. No results for input(s): CHOL, HDL, LDLCALC, TRIG, CHOLHDL, LDLDIRECT in the last 72 hours. No results for input(s): TSH, T4TOTAL, T3FREE, THYROIDAB in the last 72 hours.  Invalid input(s): FREET3 No results for input(s): VITAMINB12, FOLATE, FERRITIN, TIBC, IRON, RETICCTPCT in the last 72 hours. Coags: No results for input(s): INR in the last 72 hours.  Invalid input(s): PT Microbiology: No results found for this or any previous visit (from the past 240 hour(s)).   BRIEF HOSPITAL COURSE:  CVA (cerebral infarction):Current CVA felt to be from symptomatic left carotid artery stenosis.Recent CVA (ON 05/24/15) felt to be embolic.Status post CEA on 10/4.Spoke with neurology-Dr. Pearlean Brownie recommends to continue with aspirin and Plavix given intracranial vesseldisease. Continue statin. Continues to  have dysarthria, and mild right-sided weakness. Please note loop recorder was placed during his most recent hospitalization on 9/12-and will need follow-up with EP. Please also note, during this admission, and loop recorder was interrogated and no atrial fibrillation was detected  Active Problems: Urinary retention:Foley catheter placed after numerous straight cath unsuccessful.Continue Flomax-will require a voiding trial in the next 2-3 weeks.Please ensure patient follows up with urology.This M.D. Spoke with daughter([name]) and made aware of outpatient voiding trial in the next 2-3 weeks. She was agreeable.  Dysphagia: Seen by speech therapy-recommendations are for a dysphagia 3 diet. Patient aware of risk of aspiration. Please ensure speech therapy follow-up at SNF.  Chronic systolic heart failure: EF on recent TEE on 9/12 around 30-35%, clinically compensated-but hypotensive post CVA requiring dopamine infusion.blood pressure no stable, evaluated by cardiology-recommendations are to start low-dose Coreg, and to have outpatient cardiology follow-up for further workup. Transthoracic echocardiogram repeated on 10/5 which showed improved EF around 40%-45%   Type 2 DM:CBG's stable-continue with Lantus 28 units and SSI-follow and adjust accordingly  Dyslipidemia:continue Statin  Chronic kidney disease stage III: Creatinine close to  usual baseline. 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 Spoke with daughter([name]) and made aware of this finding, and need for outpatient monitoring.  17 mm LEFT thyroid nodule:stable for work up in the outpatient setting.This M.D. Spoke with daughter([name]) and made aware of this finding, and need for outpatient monitoring.  Chronic pain syndrome:has chronic back pain-prn narcotics  TODAY-DAY OF  DISCHARGE:  Subjective:   Lucas Morgan today has no headache,no chest abdominal pain,no new weakness tingling or numbness  Objective:   Blood pressure 114/65, pulse 81, temperature 98.5 F (36.9 C), temperature source Oral, resp. rate 16, height 5\' 9"  (1.753 m), weight 74.5 kg (164 lb 3.9 oz), SpO2 98 %.  Intake/Output Summary (Last 24 hours) at 06/19/15 1201 Last data filed at 06/19/15 1110  Gross per 24 hour  Intake    540 ml  Output   2600 ml  Net  -2060 ml   Filed Weights   06/14/15 2213 06/17/15 1800  Weight: 74.571 kg (164 lb 6.4 oz) 74.5 kg (164 lb 3.9 oz)    Exam Awake Alert, Oriented *3, No new F.N deficits, Normal affect [place].AT,PERRAL Supple Neck,No JVD, No cervical lymphadenopathy appriciated.  Symmetrical Chest wall movement, Good air movement bilaterally, CTAB RRR,No Gallops,Rubs or new Murmurs, No Parasternal Heave +ve B.Sounds, Abd Soft, Non tender, No organomegaly appriciated, No rebound -guarding or rigidity. No Cyanosis, Clubbing or edema, No new Rash or bruise  DISCHARGE CONDITION: Stable  DISPOSITION: SNF  DISCHARGE INSTRUCTIONS:    Activity:  As tolerated with Full fall precautions use walker/cane & assistance as needed  Please ask your primary care practitioner to follow-up on left thyroid nodule  Please also ask your primary care practitioner and to follow-up on abdominal aortic aneurysm  Get Medicines reviewed and adjusted: Please take all your medications with you for your next visit with your Primary MD  Please request your Primary MD to go over all hospital tests and procedure/radiological results at the follow up, please ask your Primary MD to get all Hospital records sent to his/her office.  If you experience worsening of your admission symptoms, develop shortness of breath, life threatening emergency, suicidal or homicidal thoughts you must seek medical attention immediately by calling 911 or calling your MD immediately  if symptoms less  severe.  You must read complete instructions/literature along with all the possible adverse reactions/side effects for all the Medicines you take and that have been prescribed to you. Take any new Medicines after you have completely understood and accpet all the possible adverse reactions/side effects.   Do not drive when taking Pain medications.   Do not take more than prescribed Pain, Sleep and Anxiety Medications  Special Instructions: If you have smoked or chewed Tobacco  in the last 2 yrs please stop smoking, stop any regular Alcohol  and or any Recreational drug use.  Wear Seat belts while driving.  Please note  You were cared for by a hospitalist during your hospital stay. Once you are discharged, your primary care physician will handle any further medical issues. Please note that NO REFILLS for any discharge medications will be authorized once you are discharged, as it is imperative that you return to your primary care physician (or establish a relationship with a primary care physician if you do not have one) for your aftercare needs so that they can reassess your need for  medications and monitor your lab values.   Diet recommendation: Dysphagia 3 diet with nectar thick liquids Diabetic Diet Heart Healthy diet See below for further details.  Discharge Instructions    Ambulatory referral to Neurology    Complete by:  As directed   Please schedule post stroke follow up in 2 months.     Diet - low sodium heart healthy    Complete by:  As directed   Dysphagia 3 diet with nectar thick liquids     Diet Carb Modified    Complete by:  As directed      Increase activity slowly    Complete by:  As directed            Follow-up Information    Follow up with SETHI,PRAMOD, MD In 2 months.   Specialties:  Neurology, Radiology   Why:  Stroke Clinic, Office will call you with appointment date & time   Contact information:   [address] [place]  [address] [phone]       Follow up with Waverly Ferrari, MD In 2 weeks.   Specialties:  Vascular Surgery, Cardiology   Why:  Office will call you to arrange your appt (sent)   Contact information:   [address] [place] [address] [phone]       Follow up with Dina Rich, MD. Schedule an appointment as soon as possible for a visit in 2 weeks.   Specialty:  Cardiology   Why:  for cardiology follow up   Contact information:   [address] [place] [address] [phone]      Total Time spent on discharge equals 45 minutes.  SignedJeoffrey Massed 06/19/2015 12:01 PM

## 2015-06-19 NOTE — Care Management Important Message (Signed)
Important Message  Patient Details  Name: [name] MRN: [id number] Date of Birth: [birth year]   Medicare Important Message Given:  Yes-second notification given    Kyla Balzarine 06/19/2015, 11:16 AM

## 2015-06-19 NOTE — Clinical Social Work Placement (Signed)
   CLINICAL SOCIAL WORK PLACEMENT  NOTE  Date:  06/19/2015  Patient Details  Name: Lucas Morgan MRN: [id number] Date of Birth: 09/28/1940  Clinical Social Work is seeking post-discharge placement for this patient at the Skilled  Nursing Facility level of care (*CSW will initial, date and re-position this form in  chart as items are completed):  Yes   Patient/family provided with [facility] Clinical Social Work Department's list of facilities offering this level of care within the geographic area requested by the patient (or if unable, by the patient's family).  Yes   Patient/family informed of their freedom to choose among providers that offer the needed level of care, that participate in Medicare, Medicaid or managed care program needed by the patient, have an available bed and are willing to accept the patient.  Yes   Patient/family informed of [facility]'s ownership interest in [facility] and [facility], as well as of the fact that they are under no obligation to receive care at these facilities.  PASRR submitted to EDS on       PASRR number received on       Existing PASRR number confirmed on       FL2 transmitted to all facilities in geographic area requested by pt/family on       FL2 transmitted to all facilities within larger geographic area on       Patient informed that his/her managed care company has contracts with or will negotiate with certain facilities, including the following:        Yes   Patient/family informed of bed offers received.  Patient chooses bed at [facility]     Physician recommends and patient chooses bed at      Patient to be transferred to [facility] on 06/19/15.  Patient to be transferred to facility by Personal Car     Patient family notified on 06/19/15 of transfer.  Name of family member notified:  Leo Grosser, daughter      PHYSICIAN       Additional Comment:     _______________________________________________ Gwynne Edinger, LCSW 06/19/2015, 12:05 PM

## 2015-06-19 NOTE — Care Management Note (Signed)
Case Management Note  Patient Details  Name: AALIJAH LANPHERE MRN: [id number] Date of Birth: May 14, 1941  Subjective/Objective:  Admitted with c/o difficulty walking, history of recent CVA, coronary artery disease, AAA, diabetes mellitus, dyslipidemia, chronic kidney disease, s/p L carotid endartrectomy 06/17/15.                 Action/Plan: SNF  Expected Discharge Date:        06/19/15          Expected Discharge Plan:  Skilled Nursing Facility  In-House Referral:  Clinical Social Work  Discharge planning Services  CM Consult  Post Acute Care Choice:    Choice offered to:     DME Arranged:    DME Agency:     HH Arranged:    HH Agency:     Status of Service:  Completed, signed off  Medicare Important Message Given:   Date Medicare IM Given:    Medicare IM give by:    Date Additional Medicare IM Given:    Additional Medicare Important Message give by:     If discussed at Long Length of Stay Meetings, dates discussed:    Additional Comments:  Epifanio Lesches, [place] [phone] 06/19/2015, 12:04 PM

## 2015-06-19 NOTE — Progress Notes (Signed)
Inpatient Rehabilitation  Unfortunately I have received an insurance denial from [facility] for Lucas Morgan.  I have spoken with the patient and his daughter Lucas Morgan to inform them.  Lucas Morgan is interested in pursuing short term SNF for rehab prior to pt. Returning home.  She [facility] [place].  I have notified pt's RN, as well as Lucas Morgan, CM and Lucas Morgan, SW.  I will sgn off.  Please call if questions.  Weldon Picking PT Inpatient Rehab Admissions Coordinator Cell [phone] Office [phone]

## 2015-06-19 NOTE — Progress Notes (Signed)
Patient being discharged to daughter whom will take him to [facility] via car. Patient discharge instructions reviewed with Eunice Blase, daughter, at bedside. All questions answered. Prescriptions were in discharge packet that daughter was instructed to take to Rehabilitation center. Daughter verbalized understanding of all instructions. Patient was given leg bag to travel with for his urethral catheter, which will remain per MD orders. Patient VSS. Pt in no acute distress. Discharged via wheelchair.

## 2015-06-19 NOTE — Progress Notes (Signed)
PT Cancellation Note  Patient Details Name: Lucas Morgan MRN: [id number] DOB: [birth year]   Cancelled Treatment:    Reason Eval/Treat Not Completed: Patient declined, no reason specified.  Pt  Knows he's leaving the hospital today and wishes to defer today.          Iley Breeden, Eliseo Gum 06/19/2015, 1:19 PM  06/19/2015  Baudette Bing, PT [phone]  (pager)

## 2015-06-19 NOTE — Progress Notes (Signed)
Speech Language Pathology Treatment: Dysphagia;Cognitive-Linquistic  Patient Details Name: Lucas Morgan MRN: [id number] DOB: [birth year] Today's Date: 06/19/2015 Time: 7829-5621 SLP Time Calculation (min) (ACUTE ONLY): 25 min  Assessment / Plan / Recommendation Clinical Impression  Pt was observed demonstrating overt s/sx of aspiration (immediate throat clear and cough) when drinking thin liquids. Pt was not clearing throat or coughing when talking or drinking nectar thick liquids. SLP recommends diet downgrade to nectar thick liquids, and f/u FEES to objectively evaluate swallow and new onset hoarse vocal quality after CEA.   During aphasia treatment, SLP provided moderate cues and modeling for pt to overarticulate, slow rate of speech, and use prepositions. Pt's speech continues to be agrammatic and unintelligible characterized by cluster reductions and consonant distortions. At the end of the session, SLP noticed pt was sky writing with his finger, recommend f/u with writing trials to improve functional communication.     HPI Other Pertinent Information: 74 y.o. male with history of diabetes mellitus, hyperlipidemia, coronary artery disease with previous MI, abdominal aortic aneurysm, renal insufficiency, and recent stroke presenting with worsening speech and right hemiparesis. MRI reveals acute/subacute nonhemorrhagic infarct involving the left frontal lobe to level of precentral gyrus; smaller non confluent punctate areas of infarction within the more anterior left frontal lobe, the left parietal lobe, and left occipital lobe seen to follow a watershed distribution; At least 4 punctate foci of acute nonhemorrhagic infarct are noted within the right parietal and occipital lobe.  Recent CIR stay from 9/15-9/27 with signifcant improvements in communication upon D/C; pt was anticipating [facility] therapies post discharge.  CEA on 06/17/15.    Pertinent Vitals Pain Assessment: Faces Faces Pain Scale: No hurt   SLP Plan  Continue with current plan of care;New goals to be determined pending instrumental study    Recommendations Diet recommendations: Nectar-thick liquid;Regular Liquids provided via: Cup;No straw Medication Administration: Whole meds with puree Supervision: Patient able to self feed;Intermittent supervision to cue for compensatory strategies Compensations: Slow rate;Small sips/bites Postural Changes and/or Swallow Maneuvers: Seated upright 90 degrees              Oral Care Recommendations: Oral care BID Follow up Recommendations: Home health SLP Plan: Continue with current plan of care;New goals to be determined pending instrumental study    GO     Riccardo Dubin, Student-SLP  Riccardo Dubin 06/19/2015, 9:30 AM

## 2015-06-19 NOTE — Progress Notes (Signed)
Peri care performed on patient prior to catheter insertion. Urethral catheter placed due to retention. Leta Baptist, RN at bedside to assist.

## 2015-06-19 NOTE — Clinical Social Work Note (Signed)
Clinical Social Work Assessment  Patient Details  Name: Lucas Morgan MRN: [id number] Date of Birth: 01-Dec-1940  Date of referral:  06/19/15               Reason for consult:  Facility Placement                Permission sought to share information with:  Family Supports Permission granted to share information::  Yes, Verbal Permission Granted  Name::     Williams Che   Relationship::  daughter   Housing/Transportation Living arrangements for the past 2 months:  [place] of Information:  Patient, Adult Children Patient Interpreter Needed:  None Criminal Activity/Legal Involvement Pertinent to Current Situation/Hospitalization:  No - Comment as needed Significant Relationships:  Adult Children Lives with:  Self Do you feel safe going back to the place where you live?  No Need for family participation in patient care:  Yes (Comment)  Care giving concerns: [name] expressed concern with the pt living along with her being a hour away.    Social Worker assessment / plan: CSW met with the pt at the bedside. CSW called the pt's daughter [name] via phone. CSW introduced self and purpose of the visit/call. CSW discussed SNF rehab. CSW explained the SNF process. CSW explained insurance and its relation to SNF placement. [name] requested SNF placement in [place], so the pt can be closer to her. CSW answered all questions in which the [name] inquired about. CSW will continue to follow this pt and assist with discharge as needed.   Employment status:  Retired Nurse, adult PT Recommendations:  [facility] / Referral to community resources:  [place]  Patient/Family's Response to care: [name] reported that the staff has cared for the pt well.   Patient/Family's Understanding of and Emotional Response to Diagnosis, Current Treatment, and Prognosis: [name] acknowledged the pt's current condition. [name] shared  that she felt confident that she wanted the pt closer to her after his surgery. [name] agreed to SNF placement.    Emotional Assessment Appearance:  Appears stated age Attitude/Demeanor/Rapport:   (Calm ) Affect (typically observed):  Accepting, Appropriate Orientation:  Oriented to Self, Oriented to Place, Oriented to  Time, Oriented to Situation Alcohol / Substance use:  Not Applicable Psych involvement (Current and /or in the community):  No (Comment)  Discharge Needs  Concerns to be addressed:  Denies Needs/Concerns at this time Readmission within the last 30 days:  No Current discharge risk:  None Barriers to Discharge:  No Barriers Identified   Elchanan Bob, LCSW 06/19/2015, 11:56 AM

## 2015-06-19 NOTE — Progress Notes (Signed)
    Subjective:  Denies CP or dyspnea; expressive aphasia   Objective:  Filed Vitals:   06/18/15 2130 06/18/15 2358 06/19/15 0439 06/19/15 0753  BP: 102/57 110/55 113/87 114/65  Pulse: 88 82 76 81  Temp: 98.4 F (36.9 C) 98.7 F (37.1 C) 98.7 F (37.1 C) 98.5 F (36.9 C)  TempSrc: Oral Oral Oral Oral  Resp: Height:      Weight:      SpO2: 98% 98% 94% 98%    Intake/Output from previous day:  Intake/Output Summary (Last 24 hours) at 06/19/15 1004 Last data filed at 06/19/15 0825  Gross per 24 hour  Intake    540 ml  Output   2450 ml  Net  -1910 ml    Physical Exam: Physical exam: Well-developed well-nourished in no acute distress.  Skin is warm and dry.  HEENT is normal.  Neck is supple. S/p CEA Chest is clear to auscultation with normal expansion.  Cardiovascular exam is regular rate and rhythm.  Abdominal exam nontender or distended. No masses palpated. Extremities show no edema. neuro grossly intact    Lab Results: Basic Metabolic Panel:  Recent Labs  81/19/14 0535  NA 136  K 3.9  CL 101  CO2 25  GLUCOSE 160*  BUN 10  CREATININE 1.09  CALCIUM 8.4*   CBC:  Recent Labs  06/18/15 0535  WBC 8.9  HGB 13.9  HCT 40.7  MCV 92.1  PLT 164     Assessment/Plan:  1 history of CVA-I have reviewed the patient's echocardiogram and prior transesophageal echocardiogram. 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 I think it is unlikely to be the cause of his previous CVA. He will need follow-up after discharge for future interrogations of his loop monitor. 2 cardiomyopathy-will add carvedilol 3.125 mg by mouth twice a day. He can follow-up in [place] for further medication titration and outpatient nuclear study once he recovers from his recent stroke and carotid endarterectomy. 3 status post recent carotid endarterectomy-management per vascular surgery. 4 coronary artery disease-continue aspirin and statin. Please have patient  follow-up with Dr. Wyline Mood after discharge. Please call with questions. Olga Millers 06/19/2015, 10:04 AM

## 2015-06-19 NOTE — Progress Notes (Addendum)
  Progress Note    06/19/2015 7:26 AM 2 Days Post-Op  Subjective:  No complaints this morning-wants 1/2 cup of coffee.  Belly is feeling better from yesterday. Denies trouble swallowing.  afebrile  Filed Vitals:   06/19/15 0439  BP: 113/87  Pulse: 76  Temp: 98.7 F (37.1 C)  Resp: 14     Physical Exam: Neuro:  4/5 RUE; 5/5 LUE/BLE; still with some aphasia; very minimal tongue deviation to the right Incision:  C/d/i with mild fullness.  CBC    Component Value Date/Time   WBC 8.9 06/18/2015 0535   RBC 4.42 06/18/2015 0535   HGB 13.9 06/18/2015 0535   HCT 40.7 06/18/2015 0535   PLT 164 06/18/2015 0535   MCV 92.1 06/18/2015 0535   MCH 31.4 06/18/2015 0535   MCHC 34.2 06/18/2015 0535   RDW 13.1 06/18/2015 0535   LYMPHSABS 1.8 06/14/2015 1711   MONOABS 1.0 06/14/2015 1711   EOSABS 0.4 06/14/2015 1711   BASOSABS 0.0 06/14/2015 1711    BMET    Component Value Date/Time   NA 136 06/18/2015 0535   K 3.9 06/18/2015 0535   CL 101 06/18/2015 0535   CO2 25 06/18/2015 0535   GLUCOSE 160* 06/18/2015 0535   BUN 10 06/18/2015 0535   CREATININE 1.09 06/18/2015 0535   CALCIUM 8.4* 06/18/2015 0535   GFRNONAA >60 06/18/2015 0535   GFRAA >60 06/18/2015 0535     Intake/Output Summary (Last 24 hours) at 06/19/15 0726 Last data filed at 06/18/15 2131  Gross per 24 hour  Intake 756.04 ml  Output   1600 ml  Net -843.96 ml      Assessment/Plan:  This is a 74 y.o. male who is s/p left CEA 2 Days Post-Op  -pt is doing well this am. -pt neuro exam is in tact -pt having urinary retention and has required I&O cath x 2 in past 24 hrs.  Pt does not want foley, but may eventually require one.  May benefit from starting Flomax.   -doing well from surgical standpoint. -continue aspirin, plavix, statin   Doreatha Massed, PA-C [facility] [phone]  Agree with above.  Neuro at baseline.  Incision looks fine.  Waverly Ferrari, MD, FACS Beeper  [phone] Office: [phone]

## 2015-06-19 NOTE — Progress Notes (Signed)
STROKE TEAM PROGRESS NOTE   SUBJECTIVE (INTERVAL HISTORY) No family at bedside. Pt with improving aphasia this am and wants to go home.   OBJECTIVE Temp:  [98.3 F (36.8 C)-98.7 F (37.1 C)] 98.5 F (36.9 C) (10/06 1109) Pulse Rate:  [76-97] 87 (10/06 1109) Cardiac Rhythm:  [-] Normal sinus rhythm;Bundle branch block;Heart block (10/06 0730) Resp:  [13-21] 13 (10/06 1109) BP: (102-131)/(55-87) 131/67 mmHg (10/06 1109) SpO2:  [93 %-98 %] 95 % (10/06 1109)  CBC:   Recent Labs Lab 06/14/15 1711 06/15/15 0625 06/18/15 0535  WBC 10.4 7.7 8.9  NEUTROABS 7.2  --   --   HGB 15.4 15.4 13.9  HCT 44.3 44.5 40.7  MCV 91.0 90.1 92.1  PLT 212 205 164   Basic Metabolic Panel:   Recent Labs Lab 06/15/15 0625 06/18/15 0535  NA 134* 136  K 4.1 3.9  CL 100* 101  CO2 26 25  GLUCOSE 199* 160*  BUN 12 10  CREATININE 1.20 1.09  CALCIUM 9.2 8.4*    PHYSICAL EXAM Pleasant elderly Caucasian male not in distress. . Afebrile. Head is nontraumatic. Neck is supple without bruit.    Cardiac exam no murmur or gallop. Lungs are clear to auscultation. Distal pulses are well felt. Neurologic Exam  Mental Status: Alert, oriented, thought content appropriate. Mild  Expressive aphasia and dysarthria Able to follow simple commands without difficulty. Cranial Nerves: II: Discs not visualized; Visual fields grossly normal, pupils equal, round, reactive to light and accommodation III,IV, VI: ptosis not present, extra-ocular motions intact bilaterally V,VII: Slight right facial droop, facial light touch sensation normal bilaterally VIII: hearing normal bilaterally IX,X: gag reflex present XI: bilateral shoulder shrug XII: rightward tongue extension Motor: Right :Upper extremity 4/5Left: Upper extremity 5/5 Lower extremity 5/5Lower extremity 5/5 Tone and bulk:normal tone throughout;  no atrophy noted Sensory: Pinprick and light touch intact throughout, bilaterally Cerebellar: Unable to test Gait: Not tested   ASSESSMENT/PLAN [name] is a 74 y.o. 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 He did not receive IV t-PA due to recent stroke and late presentation.  Stroke:  New dominant  Left brain caudate head infarct in setting of bilateral small scattered embolic infarcts earlier in the month, new infarct felt to be secondary to identified left internal carotid artery stenosis.  Resultant  Mild right hemiparesis, transcortical motor aphasia  MRI  multiple bilateral scattered infarcts, more concentrated on the L  MRA  nonvisualized anterior inferior cerebellar arteries  Carotid Doppler 05/27/2015 -  Findings suggest 1-39% right internal carotid artery stenosis and 60-79% left internal carotid artery stenosis. The right vertebral artery exhibits an atypical antegrade waveform. which suggests distal stenosis likely.  TEE - 05/26/2015 - EF 30-35%. No cardiac source of emboli identified  Loop recorder placed 05/26/15 by Dr. Ladona Ridgel - interrogateed - no atrial fibrillation note  LDL 60  HgbA1c 05/25/2015 - 7.5  VTE prophylaxis DIET DYS 3 Room service appropriate?: Yes; Fluid consistency:: Nectar Thick Diet Carb Modified Diet - low sodium heart healthy  aspirin 325 mg orally every day and clopidogrel 75 mg orally every day prior to admission, now on aspirin 325 mg orally every day and clopidogrel 75 mg orally every day. Recommend continuation of aspirin 81 mg and plavix 75 mg daily at discharge d/t intracranial large vessel disease  L  CEA by Edilia Bo 06/17/2015, doing well  Therapy recommendations: HH OT & PT, ongoing ST  Disposition:  anticipate return home  Stroke team will follow while in the hospital  Follow up with Dr. Pearlean Brownie in 2 months, stroke clinic, order written  Hypertension  BP as low as 69/40, dopamaine during the night now off, BP stabilized  Hyperlipidemia  Home meds:  Zocor 40 mg daily  resumed in hospital  LDL 60, goal < 70  Continue statin at discharge  Diabetes type 2  HgbA1c 7.5, goal < 7.0  Uncontrolled  Other Stroke Risk Factors  Advanced age  Cigarette smoker, quit smoking 21 years ago.  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 I have made any additions or clarifications directly to the above note. Mobilize out of bed as tolerated. Follow-up as an outpatient in stroke clinic in 2 months. Kindly call for questions.Follow up as outpatient in stroke clinic in 2 months. Kindly call for questions Delia Heady, MD Medical Director [facility] Pager: [phone] 06/19/2015 3:49 PM     To contact Stroke Continuity provider, please refer to [email or web address]. After hours, contact General Neurology

## 2015-06-25 NOTE — Progress Notes (Signed)
Loop recorder 

## 2015-07-03 ENCOUNTER — Encounter: Payer: Self-pay | Admitting: Cardiology

## 2015-07-04 ENCOUNTER — Encounter: Payer: Self-pay | Admitting: Vascular Surgery

## 2015-07-05 LAB — CUP PACEART REMOTE DEVICE CHECK: MDC IDC SESS DTM: 20161012150643

## 2015-07-05 NOTE — Progress Notes (Signed)
Carelink summary report received. Battery status OK. Normal device function. No new symptom episodes, tachy episodes, brady, or pause episodes. No new AF episodes. Monthly summary reports and ROV with GT PRN. 

## 2015-07-07 ENCOUNTER — Encounter (HOSPITAL_COMMUNITY): Payer: Medicare Other

## 2015-07-09 ENCOUNTER — Ambulatory Visit: Payer: Medicare Other | Admitting: Vascular Surgery

## 2015-07-09 ENCOUNTER — Encounter: Payer: Medicare Other | Admitting: Vascular Surgery

## 2015-07-10 ENCOUNTER — Encounter: Payer: Self-pay | Admitting: Cardiology

## 2015-07-14 ENCOUNTER — Inpatient Hospital Stay: Payer: Medicare Other | Admitting: Physical Medicine & Rehabilitation

## 2015-07-15 ENCOUNTER — Encounter: Payer: Self-pay | Admitting: Internal Medicine

## 2015-07-18 ENCOUNTER — Encounter: Payer: Self-pay | Admitting: Cardiology

## 2015-07-21 ENCOUNTER — Encounter: Payer: Self-pay | Admitting: Vascular Surgery

## 2015-07-23 ENCOUNTER — Ambulatory Visit (INDEPENDENT_AMBULATORY_CARE_PROVIDER_SITE_OTHER): Payer: Medicare Other | Admitting: Vascular Surgery

## 2015-07-23 ENCOUNTER — Encounter: Payer: Self-pay | Admitting: Vascular Surgery

## 2015-07-23 VITALS — BP 103/58 | HR 88 | Temp 97.5°F | Resp 16 | Ht 68.0 in | Wt 174.0 lb

## 2015-07-23 DIAGNOSIS — Z48812 Encounter for surgical aftercare following surgery on the circulatory system: Secondary | ICD-10-CM

## 2015-07-23 NOTE — Progress Notes (Signed)
[facility] PATIENT  Patient name: Lucas EatonJohn C Morgan MRN: [id number] DOB: 09/26/1940 Sex: male  REASON FOR VISIT: follow up after left carotid endarterectomy  HPI: Lucas Morgan is a 74 y.o. male who had a left hemispheric stroke and was found to have a 60-79% left carotid stenosis. He had significant expressive aphasia and right-sided weakness. He underwent a left carotid endarterectomy with bovine pericardial patch angioplasty on 06/17/2015. He returns for his first outpatient visit. Of note, on his preoperative study he had no significant carotid stenosis on the right.  He has been getting physical therapy at home and speech therapy. He is making gradual improvements.  He is on aspirin. He is on a statin. He is not a smoker.  Current Outpatient Prescriptions  Medication Sig Dispense Refill  . ALPRAZolam (XANAX) 0.5 MG tablet Take 1 tablet (0.5 mg total) by mouth 2 (two) times daily. 20 tablet 0  . aspirin 81 MG tablet Take 1 tablet (81 mg total) by mouth daily.    . carvedilol (COREG) 3.125 MG tablet Take 1 tablet (3.125 mg total) by mouth 2 (two) times daily with a meal. 60 tablet 0  . HYDROcodone-acetaminophen (NORCO) 10-325 MG tablet Take 1 tablet by mouth every 6 (six) hours as needed (pain). (Patient taking differently: Take 1 tablet by mouth every 6 (six) hours as needed (pain). 5-325 mg PRN) 30 tablet 0  . LANTUS SOLOSTAR 100 UNIT/ML Solostar Pen Inject 28 Units into the skin at bedtime.    Marland Kitchen. lisinopril (PRINIVIL,ZESTRIL) 5 MG tablet at bedtime.  5  . mirtazapine (REMERON) 7.5 MG tablet Take 1 tablet (7.5 mg total) by mouth at bedtime. For depression/sleep 30 tablet 1  . pantoprazole (PROTONIX) 40 MG tablet Take 1 tablet (40 mg total) by mouth daily. 30 tablet 0  . tamsulosin (FLOMAX) 0.4 MG CAPS capsule Take 1 capsule (0.4 mg total) by mouth daily. 30 capsule 0  . clopidogrel (PLAVIX) 75 MG tablet Take 1 tablet (75 mg total) by mouth daily. (Patient not taking: Reported on 07/23/2015) 30  tablet 0  . feeding supplement, GLUCERNA SHAKE, (GLUCERNA SHAKE) LIQD Take 237 mLs by mouth daily as needed (Please offer it patient consumes less than 50% of meal). (Patient not taking: Reported on 07/23/2015) 60 Can 0  . glipiZIDE (GLUCOTROL) 10 MG tablet Take 0.5 tablets (5 mg total) by mouth daily before breakfast. (Patient not taking: Reported on 07/23/2015) 15 tablet 1  . Menthol-Methyl Salicylate (MUSCLE RUB) 10-15 % CREA Apply 1 application topically 2 (two) times daily as needed for muscle pain. (Patient not taking: Reported on 07/23/2015) 113 g 0  . senna-docusate (SENOKOT-S) 8.6-50 MG tablet Take 1 tablet by mouth 2 (two) times daily. (Patient not taking: Reported on 07/23/2015) 60 tablet 0  . simvastatin (ZOCOR) 40 MG tablet Take 40 mg by mouth at bedtime.  6   No current facility-administered medications for this visit.    REVIEW OF SYSTEMS:  [X]  denotes positive finding, [ ]  denotes negative finding Cardiac  Comments:  Chest pain or chest pressure:    Shortness of breath upon exertion:    Short of breath when lying flat:    Irregular heart rhythm:    Constitutional    Fever or chills:      PHYSICAL EXAM: Filed Vitals:   07/23/15 1359 07/23/15 1403  BP: 106/62 103/58  Pulse: 89 88  Temp:  97.5 F (36.4 C)  TempSrc:  Oral  Resp:  16  Height:  5\' 8"  (  1.727 m)  Weight:  174 lb (78.926 kg)  SpO2:  99%    GENERAL: The patient is a well-nourished male, in no acute distress. The vital signs are documented above. CARDIOVASCULAR: There is a regular rate and rhythm. PULMONARY: There is good air exchange bilaterally without wheezing or rales. His left neck incision is healing nicely. He has residual right upper extremity weakness and also still has significant expressive aphasia.  MEDICAL ISSUES:  The patient is doing well status post left carotid endarterectomy on 06/17/2015. He had no significant carotid stenosis on the right. He will continue his therapy for his stroke. I  ordered a follow up carotid duplex scan in 6 months. He is on aspirin and is on a statin. He knows to call sooner if he has problems.  Waverly Ferrari [facility] Beeper: [phone]

## 2015-07-23 NOTE — Addendum Note (Signed)
Addended by: Adria DillELDRIDGE-LEWIS, Raseel Jans L on: 07/23/2015 03:16 PM   Modules accepted: Orders

## 2015-07-24 ENCOUNTER — Encounter: Payer: Self-pay | Admitting: Cardiology

## 2015-07-25 ENCOUNTER — Encounter: Payer: Medicare Other | Admitting: *Deleted

## 2015-07-30 ENCOUNTER — Ambulatory Visit: Payer: Self-pay | Admitting: Neurology

## 2015-07-31 ENCOUNTER — Encounter: Payer: Self-pay | Admitting: Cardiology

## 2015-08-14 ENCOUNTER — Encounter: Payer: Self-pay | Admitting: Neurology

## 2015-08-14 ENCOUNTER — Ambulatory Visit (INDEPENDENT_AMBULATORY_CARE_PROVIDER_SITE_OTHER): Payer: Medicare Other | Admitting: Neurology

## 2015-08-14 ENCOUNTER — Encounter: Payer: Self-pay | Admitting: Cardiology

## 2015-08-14 VITALS — BP 107/67 | HR 80 | Ht 69.0 in | Wt 171.0 lb

## 2015-08-14 DIAGNOSIS — I255 Ischemic cardiomyopathy: Secondary | ICD-10-CM | POA: Diagnosis not present

## 2015-08-14 DIAGNOSIS — E1122 Type 2 diabetes mellitus with diabetic chronic kidney disease: Secondary | ICD-10-CM

## 2015-08-14 DIAGNOSIS — I5023 Acute on chronic systolic (congestive) heart failure: Secondary | ICD-10-CM

## 2015-08-14 DIAGNOSIS — Z794 Long term (current) use of insulin: Secondary | ICD-10-CM

## 2015-08-14 DIAGNOSIS — I63132 Cerebral infarction due to embolism of left carotid artery: Secondary | ICD-10-CM

## 2015-08-14 DIAGNOSIS — Z9889 Other specified postprocedural states: Secondary | ICD-10-CM

## 2015-08-14 DIAGNOSIS — N182 Chronic kidney disease, stage 2 (mild): Secondary | ICD-10-CM | POA: Diagnosis not present

## 2015-08-14 MED ORDER — GABAPENTIN 100 MG PO CAPS: 300.0000 mg | ORAL_CAPSULE | Freq: Three times a day (TID) | ORAL | Status: DC

## 2015-08-14 NOTE — Patient Instructions (Addendum)
-   continue ASA and plavix for stroke prevention - prescribe neurontin low dose for numbness and pain at left arm - recommend to continue PT/OT/speech - check BP and glucose at home - Follow up with your primary care physician for stroke risk factor modification. Recommend maintain blood pressure goal <130/80, diabetes with hemoglobin A1c goal below 6.5% and lipids with LDL cholesterol goal below 70 mg/dL.  - follow up with loop recorder - decrease lisinopril to 2.5mg  (half tablet) to keep BP higher due to vessel narrowing in the neck.  - consider antidepressant next visit if still has depressed mood. - follow up in 3 months.

## 2015-08-15 DIAGNOSIS — E1122 Type 2 diabetes mellitus with diabetic chronic kidney disease: Secondary | ICD-10-CM | POA: Insufficient documentation

## 2015-08-15 DIAGNOSIS — I5023 Acute on chronic systolic (congestive) heart failure: Secondary | ICD-10-CM | POA: Insufficient documentation

## 2015-08-15 DIAGNOSIS — Z9889 Other specified postprocedural states: Secondary | ICD-10-CM | POA: Insufficient documentation

## 2015-08-15 DIAGNOSIS — Z794 Long term (current) use of insulin: Secondary | ICD-10-CM

## 2015-08-15 DIAGNOSIS — N182 Chronic kidney disease, stage 2 (mild): Secondary | ICD-10-CM

## 2015-08-15 NOTE — Progress Notes (Signed)
STROKE NEUROLOGY FOLLOW UP NOTE  NAME: Lucas Morgan DOB: 25-Oct-1940  REASON FOR VISIT: stroke follow up HISTORY FROM: Patient and chart  Today we had the pleasure of seeing Lucas Morgan in follow-up at our Neurology Clinic. Pt was accompanied by Niece.   History Summary [name] is a 74 y.o. male with history of DM, HLD, CAD with previous MI, AAA, renal insufficiency, and recent stroke was admitted on 05/27/2015 for worsening speech and right hemiparesis. MRI showed primarily left MCA infarct but also punctate right MCA/PCA infarcts. MRA showed moderate intracranial stenosis. Carotid Doppler showed left ICA 60-79% stenosis, therefore CTA neck was done showed 60% left ICA stenosis and dissection flap extending to the central lumen. Although his stroke was most likely due to left ICA stenosis and dissection distal to stenosis, however, there is also right MCA/PCA punctate infarcts, raising concern for cardioembolic infarcts. Therefore TEE was done, no SOE or PFO, but EF 30-35% down from previous to 50-55% in September. Loop recorder placed after. LDL 60 and A1c 7.5. Vascular surgery consulted for consideration of left CEA, but was decided to perform after patient has more recovery. He was put on dural antiplatelet for 3 months on discharge. At that time patient still has aphasia with mild right hemiparesis (hand > leg).  Patient was re-admitted on 06/14/2015 for gait disturbance, dizziness and blurry vision. 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 Loop recorder interrogated, no A. fib found. Vascular surgery consulted again, performed left CEA on 06/17/2015. Patient tolerated well. However, developed acute on chronic CHF, cardiology consulted, treated with Lasix, but caused hypotension which was further treated with dopamine. Cardiology reviewed TEE showed possible small  lambl's excrescence, but do not think that is the cause of the stroke. After stabilization, he was discharged with dual antiplatelet and Zocor.  Interval History During the interval time, the patient has been doing better.  Blood pressure is still on the low side, today 107/67 in clinic. His wound lisinopril 5 mg and Coreg 3.125 mg for blood pressure. Glucose at home 120-150. Has not seen cardiology Dr. Wyline Mood yet. Complaint of right wrist, right arm, right shoulder pain, feeling tight, and right hand and arm shakiness on exertion. Still has mild to moderate expressive aphasia. Loop recorder so far no A. fib. Patient follow-up with vascular surgery on 07/23/15 and seems doing well.  REVIEW OF SYSTEMS: Full 14 system review of systems performed and notable only for those listed below and in HPI above, all others are negative:  Constitutional:  Fatigue Cardiovascular:  Ear/Nose/Throat:   Skin:  Eyes:   Respiratory:   Gastroitestinal:   Genitourinary:  Hematology/Lymphatic:   Endocrine:  Musculoskeletal:  Joint pain, back pain, walking difficulty, neck pain Allergy/Immunology:   Neurological:  Memory loss, dizziness, numbness, speech difficulty, weakness Psychiatric:  Sleep: Daytime sleepiness  The following represents the patient's updated allergies and side effects list: Allergies  Allergen Reactions  . Trazodone And Nefazodone Other (See Comments)    High blood sugar    The neurologically relevant items on the patient's problem list were reviewed on today's visit.  Neurologic Examination  A problem focused neurological exam (12 or more points of the single system neurologic examination, vital signs counts as 1 point, cranial nerves count for 8 points) was performed.  Blood pressure 107/67, pulse 80, height  (1.753 m), weight 171 lb (  77.565 kg).  General - Well nourished, well developed, in depressed mood.  Ophthalmologic - fundi not visualized due to  noncooperation.  Cardiovascular - Regular rate and rhythm.  Mental Status -  Level of arousal and orientation to time, place, and person were intact. Comprehension was intact, however, moderate nonfluent aphasia, 2/4 naming with mild deficit with repetition. Fund of Knowledge was assessed and was intact.  Cranial Nerves II - XII - II - Visual field intact OU. III, IV, VI - Extraocular movements intact. V - Facial sensation intact bilaterally. VII - Facial movement intact bilaterally. VIII - Hearing & vestibular intact bilaterally. X - Palate elevates symmetrically. XI - Chin turning & shoulder shrug intact bilaterally. XII - Tongue protrusion intact.  Motor Strength - The patient's strength was normal in all extremities except 4/5 RUE with right shoulder and wrist pain and pronator drift was absent.  Bulk was normal and fasciculations were absent.   Motor Tone - Muscle tone was assessed at the neck and appendages and was normal.  Reflexes - The patient's reflexes were 1+ in all extremities and he had no pathological reflexes.  Sensory - Light touch, temperature/pinprick were assessed and were normal.    Coordination - right upper extremity ataxic on FTN.  Intentional tremor on the right hand.  Gait and Station - slow, small stride.  Data reviewed: I personally reviewed the images and agree with the radiology interpretations.  Ct Head Wo Contrast 05/24/2015  Well-defined area of cytotoxic edema affecting the LEFT superior frontal cortex and subcortical white matter consistent with a late acute cerebral infarction. No features to suggest proximal vascular occlusion or hemorrhagic transformation. Chronic changes as described.   MR MRA head/brain without contrast  05/25/2015 1. Acute/subacute nonhemorrhagic infarct involving the left frontal lobe to level of the precentral gyrus. 2. Additional smaller non confluent punctate areas of infarction within the more anterior left  frontal lobe, the left parietal lobe, and left occipital lobe seen to follow a watershed distribution.This suggests more proximal disease, potentially in the neck, or and episode of hypotension. 3. At least 4 punctate foci of acute nonhemorrhagic infarct are noted within the right parietal and occipital lobe. Given the bilateral distribution, a central embolic source also needs to be considered. 4. Age advanced atrophy and diffuse white matter disease. 5. Moderate right and mild left mid A1 segment stenoses. 6. Moderate distal small vessel disease, most evident within the right MCA branch vessels.  MRI and MRA 06/14/2015 MRI HEAD Exam is motion degraded. New tiny acute left caudate head infarct. Several subacute infarcts demonstrate expected evolution. Largest subacute infarct left frontal lobe (with mild laminar necrosis). Scattered small subacute infarcts noted bilaterally. Remote small caudate infarct. No intracranial hemorrhage. Moderate small vessel disease type changes. Global atrophy without hydrocephalus. Polypoid opacification anterior aspect sphenoid sinus.  MRA HEAD FINDINGS Anterior circulation without medium or large size vessel significant stenosis or occlusion. Middle cerebral artery branch vessel narrowing and irregularity bilaterally. Right vertebral artery ends in a posterior inferior cerebellar artery distribution. The right posterior inferior cerebellar artery is narrowed/ irregular and poorly delineated. No significant stenosis left vertebral artery or basilar artery. Nonvisualized anterior inferior cerebellar arteries. Mild irregularity of portions of the posterior cerebral artery bilaterally with narrowing most notable involving distal branches.  CTA Neck 05/28/2015 5 mm segment of 60% stenosis LEFT internal carotid artery associated with fibromuscular dysplasia versus atherosclerosis, and dissection flap extending to the central lumen. 50% stenosis RIGHT  ICA origin. 17 mm LEFT  thyroid nodule for which follow-up dedicated thyroid sonogram is recommended on a nonemergent basis.  TEE  Left Ventrical: Moderate LV dysfunction. EF 30-35% Mitral Valve: MILD MR Aortic Valve: normal AV  Tricuspid Valve: trace TR  Pulmonic Valve: normal PV Left Atrium/ Left atrial appendage: no thrombi  Atrial septum: no evidence of PFO or ASD by color Doppler flow or bubble study  Aorta: mild - moderate aortic calcifications  TTE 06/18/15 - Compared to a prior study in 05/2015, the EF has improved to 40-45%. There is calcification of the aortic valve and a possible small mobile density on the non-coronary cusp that could be a sounce of embolism.  CUS - 1-39% right internal carotid artery stenosis and 60-79% left internal carotid artery stenosis. The right vertebral artery is patent with atypical flow antegrade flow. The left vertebral artery is patent with antegrade flow. The right subclavian artery is patent with antegrade biphasic flow.  Component     Latest Ref Rng 05/25/2015  Cholesterol     0 - 200 mg/dL 409  Triglycerides     <150 mg/dL 811  HDL Cholesterol     >40 mg/dL 33 (L)  Total CHOL/HDL Ratio      3.6  VLDL     0 - 40 mg/dL 26  LDL (calc)     0 - 99 mg/dL 60  Hemoglobin B1Y     4.8 - 5.6 % 7.5 (H)  Mean Plasma Glucose      169    Assessment: As you may recall, he is a 74 y.o. Caucasian male with PMH of DM, HLD, CAD with previous MI, AAA, renal insufficiency was admitted on 05/27/2015 for primarily left MCA infarct but also punctate right MCA/PCA infarcts. MRA showed moderate intracranial stenosis. CUS showed left ICA 60-79% stenosis, therefore CTA neck was done showed 60% left ICA stenosis. Although his stroke was most likely due to left ICA stenosis, however, there is also right MCA/PCA punctate infarcts, raising concern for cardioembolic infarcts. TEE was done, no SOE or PFO, but EF 30-35% down from previous to  50-55%. Loop recorder placed. LDL 60 and A1c 7.5. Vascular surgery consulted for consideration of left CEA, but was decided to perform after patient has more recovery. He was put on dural antiplatelet for 3 months on discharge. 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 Loop recorder interrogated, no A. fib found. Vascular surgery performed left CEA on 06/17/2015. 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 However, repeat 2-D echo showed AV possible small mobile density. During the interval time, the patient has been doing better. Has right wrist, arm, and shoulder pain with mild to moderate expressive aphasia. BP on the low side.  Plan:  - continue ASA and plavix for stroke prevention for now - prescribe neurontin low dose for numbness and pain - recommend to continue PT/OT/speech - check BP and glucose at home - Follow up with your primary care physician for stroke risk factor modification. Recommend maintain blood pressure goal <130/80, diabetes with hemoglobin A1c goal below 6.5% and lipids with LDL cholesterol goal below 70 mg/dL.  - follow up with loop recorder - decrease lisinopril to 2.5mg  due to vessel narrowing in the neck.  - consider antidepressant next visit if still has depressed mood. - call Dr. Wyline Mood  to make appointment for follow up. - follow up in 3 months.   I spent more than 25 minutes of face to face time with the patient. Greater than 50% of time was spent in counseling and coordination of care.    Orders Placed This Encounter  Procedures  . Ambulatory referral to Physical Therapy    Referral Priority:  Routine    Referral Type:  Physical Medicine    Referral Reason:  Specialty Services Required    Requested Specialty:  Physical Therapy    Number  of Visits Requested:  1  . Ambulatory referral to Occupational Therapy    Referral Priority:  Routine    Referral Type:  Occupational Therapy    Referral Reason:  Specialty Services Required    Requested Specialty:  Occupational Therapy    Number of Visits Requested:  1  . Ambulatory referral to Speech Therapy    Referral Priority:  Routine    Referral Type:  Speech Therapy    Referral Reason:  Specialty Services Required    Requested Specialty:  Speech Pathology    Number of Visits Requested:  1    Meds ordered this encounter  Medications  . DISCONTD: atorvastatin (LIPITOR) 20 MG tablet    Sig: TK 1 T PO QD    Refill:  5  . gabapentin (NEURONTIN) 100 MG capsule    Sig: Take 3 capsules (300 mg total) by mouth 3 (three) times daily.    Dispense:  90 capsule    Refill:  2    Patient Instructions  - continue ASA and plavix for stroke prevention - prescribe neurontin low dose for numbness and pain at left arm - recommend to continue PT/OT/speech - check BP and glucose at home - Follow up with your primary care physician for stroke risk factor modification. Recommend maintain blood pressure goal <130/80, diabetes with hemoglobin A1c goal below 6.5% and lipids with LDL cholesterol goal below 70 mg/dL.  - follow up with loop recorder - decrease lisinopril to 2.5mg  (half tablet) to keep BP higher due to vessel narrowing in the neck.  - consider antidepressant next visit if still has depressed mood. - follow up in 3 months.     Marvel PlanJindong Zyair Rhein, MD PhD [facility] [address], [address] [place], [place] [address] [phone]

## 2015-08-22 ENCOUNTER — Encounter: Payer: Self-pay | Admitting: Cardiology

## 2015-08-27 ENCOUNTER — Other Ambulatory Visit: Payer: Self-pay | Admitting: Physical Medicine and Rehabilitation

## 2015-08-28 ENCOUNTER — Encounter: Payer: Self-pay | Admitting: Cardiology

## 2015-09-04 ENCOUNTER — Encounter: Payer: Self-pay | Admitting: Cardiology

## 2015-09-15 ENCOUNTER — Other Ambulatory Visit: Payer: Self-pay | Admitting: Neurology

## 2015-09-17 ENCOUNTER — Encounter: Payer: Self-pay | Admitting: Cardiology

## 2015-09-17 ENCOUNTER — Ambulatory Visit (INDEPENDENT_AMBULATORY_CARE_PROVIDER_SITE_OTHER): Payer: Medicare Other | Admitting: Cardiology

## 2015-09-17 VITALS — BP 90/58 | HR 101 | Ht 69.0 in | Wt 175.0 lb

## 2015-09-17 DIAGNOSIS — I5022 Chronic systolic (congestive) heart failure: Secondary | ICD-10-CM | POA: Diagnosis not present

## 2015-09-17 DIAGNOSIS — I714 Abdominal aortic aneurysm, without rupture, unspecified: Secondary | ICD-10-CM

## 2015-09-17 DIAGNOSIS — I2510 Atherosclerotic heart disease of native coronary artery without angina pectoris: Secondary | ICD-10-CM

## 2015-09-17 MED ORDER — ATORVASTATIN CALCIUM 80 MG PO TABS: 80.0000 mg | ORAL_TABLET | Freq: Every day | ORAL | Status: AC

## 2015-09-17 NOTE — Progress Notes (Signed)
Patient ID: JOSELITO FIELDHOUSE, male   DOB: [birth year], 75 y.o.   MRN: [id number]     Clinical Summary Mr. Oleson is a 75 y.o.male seen today for follow up of the following medical problems.    1. CAD/ICM/Chronic systolic HF - prior inferior MI 10/2008 with DES to LCX and staged PCI of the LAD with a DES.  - last cath 12/2009 with patent stents, severe diagonal disease that was opened with cutting balloon angioplasty. - echo 05/2014 LVEF 50-55%, abnormal diastolic function, multiple WMAs.  - 05/2015 TEE LVEF 30-35% - 06/2015 TTE LVEF 40-45%, grade I diastolic dysfunction  - medications for CHF limited due to soft bp's. From neuro notes trying to keep bp reasonable due to continued obstructive cerebral vascular disease.  - no SOB or DOE. No chest pain. No LE edema. Weights are stable around 175 lbs.    2. Hyperlipidemia - followed by PCP - compliant with statin  3. Abdominal aortic aneurysm - incidental finding on lumbar MRI 02/2013 - 09/2014 2.9 x 2.9 cm - denies any abdominal pain  4. 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 He subsequently had left carotid endarectomy done as this was the likely etiology. TEE without source of emboli, loop recorder placed and has had no significant arrhythmias.  - he is on ASA and plavix for secondary prevention per neuro   .5. Carotid stenosis - s/p left CEA 06/2015 in setting of CVA. He continues to be followed by vascular.  Past Medical History  Diagnosis Date  . Diabetes mellitus   . Dyslipidemia   . MI (myocardial infarction) (HCC) 11/09/2008    2.5 x 23 Xience V DES to the CFX  . AAA (abdominal aortic aneurysm) (HCC)     a. Abd U/S 7/14: mild aneurysmal dilatation 3x3 cm; cholelithiasis without evid of cholecystitis => repeat 1 year  . Coronary artery disease   . Arthritis     stenosis, lumbar region  . Renal insufficiency   . Stroke [facility])      Allergies  Allergen Reactions  . Trazodone And Nefazodone Other (See Comments)    High  blood sugar     Current Outpatient Prescriptions  Medication Sig Dispense Refill  . aspirin 81 MG tablet Take 1 tablet (81 mg total) by mouth daily.    . carvedilol (COREG) 3.125 MG tablet Take 1 tablet (3.125 mg total) by mouth 2 (two) times daily with a meal. 60 tablet 0  . clopidogrel (PLAVIX) 75 MG tablet Take 1 tablet (75 mg total) by mouth daily. 30 tablet 0  . feeding supplement, GLUCERNA SHAKE, (GLUCERNA SHAKE) LIQD Take 237 mLs by mouth daily as needed (Please offer it patient consumes less than 50% of meal). 60 Can 0  . gabapentin (NEURONTIN) 100 MG capsule TAKE 3 CAPSULES(300 MG) BY MOUTH THREE TIMES DAILY 90 capsule 3  . HYDROcodone-acetaminophen (NORCO) 10-325 MG tablet Take 1 tablet by mouth every 6 (six) hours as needed (pain). (Patient taking differently: Take 1 tablet by mouth every 6 (six) hours as needed (pain). 5-325 mg PRN) 30 tablet 0  . LANTUS SOLOSTAR 100 UNIT/ML Solostar Pen Inject 28 Units into the skin at bedtime. (Patient taking differently: Inject 80 Units into the skin at bedtime. )    . lisinopril (PRINIVIL,ZESTRIL) 5 MG tablet at bedtime.  5  . Menthol-Methyl Salicylate (MUSCLE RUB) 10-15 % CREA Apply 1 application topically 2 (two) times daily as needed for muscle pain. 113 g 0  . mirtazapine (  REMERON) 7.5 MG tablet Take 1 tablet (7.5 mg total) by mouth at bedtime. For depression/sleep 30 tablet 1  . pantoprazole (PROTONIX) 40 MG tablet Take 1 tablet (40 mg total) by mouth daily. 30 tablet 0  . senna-docusate (SENOKOT-S) 8.6-50 MG tablet Take 1 tablet by mouth 2 (two) times daily. 60 tablet 0  . tamsulosin (FLOMAX) 0.4 MG CAPS capsule Take 1 capsule (0.4 mg total) by mouth daily. 30 capsule 0   No current facility-administered medications for this visit.     Past Surgical History  Procedure Laterality Date  . Knee surgery    . Coronary stent placement    . Back surgery  2015    lumbar fusion  . Joint replacement Left   . 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 Lumbar laminectomy/decompression microdiscectomy N/A 10/18/2014    Procedure: LUMBAR LAMINECTOMY/DECOMPRESSION MICRODISCECTOMYLUMBAR THREE-FOUR ;  Surgeon: Temple Pacini, MD;  Location: [facility] NEURO ORS;  Service: Neurosurgery;  Laterality: N/A;  . Coronary angioplasty    . Ep implantable device N/A 05/26/2015    Procedure: Loop Recorder Insertion;  Surgeon: Marinus Maw, MD;  Location: [facility] INVASIVE CV LAB;  Service: Cardiovascular;  Laterality: N/A;  . Tee without cardioversion N/A 05/26/2015    Procedure: TRANSESOPHAGEAL ECHOCARDIOGRAM (TEE);  Surgeon: Vesta Mixer, MD;  Location: [facility] ENDOSCOPY;  Service: Cardiovascular;  Laterality: N/A;  . Endarterectomy Left 06/17/2015    Procedure: Left Carotid ENDARTERECTOMY with Livia Snellen Patch;  Surgeon: Chuck Hint, MD;  Location[facility];  Service: Vascular;  Laterality: Left;     Allergies  Allergen Reactions  . Trazodone And Nefazodone Other (See Comments)    High blood sugar      Family History  Problem Relation Age of Onset  . Heart attack Brother     x2 brothers  . CAD Brother      Social History Mr. Lance reports that he has never smoked. He has never used smokeless tobacco. Mr. Hermiz reports that he does not drink alcohol.   Review of Systems CONSTITUTIONAL: No weight loss, fever, chills, weakness or fatigue.  HEENT: Eyes: No visual loss, blurred vision, double vision or yellow sclerae.No hearing loss, sneezing, congestion, runny nose or sore throat.  SKIN: No rash or itching.  CARDIOVASCULAR: per hpi RESPIRATORY: No shortness of breath, cough or sputum.  GASTROINTESTINAL: No anorexia, nausea, vomiting or diarrhea. No abdominal pain or blood.  GENITOURINARY: No burning on urination, no polyuria NEUROLOGICAL: No headache, dizziness, syncope, paralysis, ataxia, numbness or tingling in the extremities. No change in bowel or bladder control.  MUSCULOSKELETAL: No muscle, back pain,  joint pain or stiffness.  LYMPHATICS: No enlarged nodes. No history of splenectomy.  PSYCHIATRIC: No history of depression or anxiety.  ENDOCRINOLOGIC: No reports of sweating, cold or heat intolerance. No polyuria or polydipsia.  Marland Kitchen   Physical Examination Filed Vitals:   09/17/15 0941 09/17/15 0949  BP: 76/46 90/58  Pulse: 102 101   Filed Vitals:   09/17/15 0941  Height: 5\' 9"  (1.753 m)  Weight: 175 lb (79.379 kg)    Gen: resting comfortably, no acute distress HEENT: no scleral icterus, pupils equal round and reactive, no palptable cervical adenopathy,  CV: RRR, no m/r/g, no jvd Resp: Clear to auscultation bilaterally GI: abdomen is soft, non-tender, non-distended, normal bowel sounds, no hepatosplenomegaly MSK: extremities are warm, no edema.  Skin: warm, no rash Neuro:  no focal deficits Psych: appropriate  affect   Diagnostic Studies  Cath 12/2009 PROCEDURAL FINDINGS: The right coronary artery is small and nondominant. There is no significant obstructive disease.  Left mainstem: The left main is widely patent. There is no significant stenosis. The left main divides into the LAD and left circumflex.  LAD: The LAD is patent throughout its course. There is a widely patent stent in the proximal LAD with no significant in-stent restenosis. There are luminal irregularities throughout the mid LAD without obstructive disease. The first diagonal is a large Mykenna Viele with severe ostial narrowing. There is TIMI III flow but the ostium of the diagonal appears hypodense with 95-99% stenosis.  Left circumflex: The circumflex is patent. The first OM Karole Oo has a patent stent and it is a large Rainn Bullinger. The stent has no significant restenosis. The AV groove circumflex courses down and has minor luminal irregularities but no significant stenosis is noted. The left circumflex is dominant and supplies a small left PDA.  FINAL ASSESSMENT: 1. Severe ostial diagonal stenosis  with successful cutting balloon  angioplasty. 2. Continued patency of the left anterior descending (coronary artery)  and obtuse marginal stents with otherwise nonobstructive coronary  artery disease.  10/2009 MPI IMPRESSION: Large perfusion defect identified at the lateral and inferior walls of the left ventricle with evidence of significant reperfusion at the lateral wall portion of the defect on resting exam. Mildly decreased left ventricle ejection fraction of 43%. Hypokinesia of the lateral and inferior walls of the left ventricle.  05/2014 Echo LVEF 50-55%, abnormal diastolic function.   02/2015 ABI Normal bilaterally  06/2015 echo Study Conclusions  - Left ventricle: The cavity size was normal. Wall thickness was normal. Systolic function was mildly to moderately reduced. The estimated ejection fraction was in the range of 40% to 45%. Diffuse hypokinesis. LV apical false tendon. Doppler parameters are consistent with abnormal left ventricular relaxation (grade 1 diastolic dysfunction). The E/e&' ratio is between 8-15, suggesting indeterminate LV filling pressure. - Aortic valve: Calcified with a mobile density that appears attached to the non-coronary cusp. There was no regurgitation. - Mitral valve: Sclerotic leaflet tips. Trivial MR. - Left atrium: The atrium was normal in size. - Right atrium: The atrium was mildly dilated. - Atrial septum: Mobile IAS. PFO cannot be excluded. - Tricuspid valve: There was trivial regurgitation.  Impressions:  - Compared to a prior study in 05/2015, the EF has improved to 40-45%. There is calcification of the aortic valve and a possible small mobile density on the non-coronary cusp that could be a sounce of embolism.   05/2015 Carotid US Summary: Findings suggest 1-39% right internal carotid artery stenosis and 60-79% left internal carotid artery stenosis. The right vertebral artery exhibits an  atypical antegrade waveform. which suggests distal stenosis likely. The left vertebral artery is patent with antegrade flow.  05/2015 TEE Study Conclusions  - Left ventricle: Systolic function was moderately to severely reduced. The estimated ejection fraction was in the range of 30% to 35%. - Aortic valve: No evidence of vegetation. - Mitral valve: No evidence of vegetation. There was mild regurgitation. - Left atrium: No evidence of thrombus in the atrial cavity or appendage. - Atrial septum: No defect or patent foramen ovale was identified. - Tricuspid valve: No evidence of vegetation.    Assessment and Plan  1.CAD/ICM/Chronic systolic HF - medical therapy limited by low bp's - appears euvolemic, no current symptoms - continue current meds  2. Hyperlipidemia - given his clinical ASCVD he should be on high dose  statin, start atorva 80mg  daily  3. AAA - repeat serial US   F/u 3 months    Antoine PocheJonathan F. Hollyann Pablo, M.D., F.A.C.C.

## 2015-09-17 NOTE — Patient Instructions (Addendum)
Your physician recommends that you schedule a follow-up appointment in: 3 MONTHS WITH DR. BRANCH   Your physician has recommended you make the following change in your medication:   INCREASE ATORVASTATIN 80 MG DAILY   Your physician has requested that you have an abdominal aorta duplex. During this test, an ultrasound is used to evaluate the aorta. Allow 30 minutes for this exam. Do not eat after midnight the day before and avoid carbonated beverages  WE WILL SCHEDULE YOU FOR DEVICE FOLLOW UP FOR LOOP RECORDER  Thank you for choosing [facility]!!

## 2015-09-23 ENCOUNTER — Telehealth: Payer: Self-pay | Admitting: *Deleted

## 2015-09-23 NOTE — Telephone Encounter (Signed)
Pt daughter aware [place]

## 2015-09-23 NOTE — Telephone Encounter (Signed)
-----   Message from Megan SalonVicky T Slaughter sent at 09/23/2015 10:57 AM EST ----- Are you planning on calling this patient back? I have device check appt but cant make April appts. Yet.    ----- Message -----    From: Albertine PatriciaStaci T Srinivas Lippman, CMA    Sent: 09/22/2015  11:18 AM      To: Megan SalonVicky T Slaughter  This pt needed 3 month f/u with Dr. Wyline MoodBranch and an appt for device check. Thank you.  Marsa Matteo

## 2015-09-24 ENCOUNTER — Ambulatory Visit (HOSPITAL_COMMUNITY): Payer: Medicare Other | Admitting: Physical Therapy

## 2015-09-24 ENCOUNTER — Ambulatory Visit (HOSPITAL_COMMUNITY): Payer: Medicare Other | Admitting: Speech Pathology

## 2015-09-24 ENCOUNTER — Telehealth: Payer: Self-pay | Admitting: *Deleted

## 2015-09-24 ENCOUNTER — Ambulatory Visit (HOSPITAL_COMMUNITY): Payer: Medicare Other | Admitting: Occupational Therapy

## 2015-09-24 NOTE — Telephone Encounter (Signed)
Spoke with [name], patient's daughter.  She states that the patient was recently hospitalized and was told to bring his [employer] monitor with him to the hospital.  It did not go home with him at discharge.  Advised [name] to call [employer] tech services to order a new monitor (phone number given) and to call us when it arrives and I will ensure it's transmitting.  Remo Lippsdvised [name] that I will cancel 10/17/15 pacer check if we take these steps and the patient resumes nightly monitoring.  [name] verbalizes understanding and denies additional questions or concerns at this time.

## 2015-09-25 ENCOUNTER — Ambulatory Visit: Payer: Medicare Other

## 2015-09-25 DIAGNOSIS — I714 Abdominal aortic aneurysm, without rupture, unspecified: Secondary | ICD-10-CM

## 2015-09-30 ENCOUNTER — Telehealth: Payer: Self-pay | Admitting: Cardiology

## 2015-09-30 ENCOUNTER — Telehealth: Payer: Self-pay | Admitting: *Deleted

## 2015-09-30 NOTE — Telephone Encounter (Signed)
Spoke w/ pt daughter and instructed her how to send a manual transmission. Transmission was successful and received. Pt daughter aware and verbalized understanding.

## 2015-09-30 NOTE — Telephone Encounter (Signed)
Pt daughter called requesting to talk with Irving Burton.

## 2015-09-30 NOTE — Telephone Encounter (Signed)
Pt aware, routed to pcp 

## 2015-09-30 NOTE — Telephone Encounter (Signed)
Called [name] to let her know that since patient's transmission was successfully received from new [employer] monitor, it is no longer necessary to keep 10/17/15 device clinic appointment.  Patient answered phone and requested that I speak with Eunice Blase as she handles his appointments.  He did not want to relay the message.  Advised patient that I would attempt to reach [facility] tomorrow.

## 2015-09-30 NOTE — Telephone Encounter (Signed)
-----   Message from Antoine Poche, MD sent at 09/29/2015  9:58 AM EST ----- Abdominal aortic aneurysm remains small in size, we will repeat US next year  J BrancH MD

## 2015-10-01 NOTE — Telephone Encounter (Signed)
Spoke with [name].  She is agreeable to canceling device clinic appointment in [place] on 10/17/15 as patient's monitor is transmitting successfully.  She denies questions or concerns at this time.

## 2015-10-23 ENCOUNTER — Ambulatory Visit (INDEPENDENT_AMBULATORY_CARE_PROVIDER_SITE_OTHER): Payer: Medicare Other | Admitting: *Deleted

## 2015-10-23 DIAGNOSIS — I639 Cerebral infarction, unspecified: Secondary | ICD-10-CM | POA: Diagnosis not present

## 2015-10-23 NOTE — Progress Notes (Signed)
[employer] Summary Report / Loop Recorder 

## 2015-10-31 ENCOUNTER — Encounter: Payer: Self-pay | Admitting: Cardiology

## 2015-11-07 ENCOUNTER — Encounter: Payer: Self-pay | Admitting: Cardiology

## 2015-11-14 ENCOUNTER — Encounter: Payer: Self-pay | Admitting: Cardiology

## 2015-11-18 ENCOUNTER — Encounter: Payer: Self-pay | Admitting: Neurology

## 2015-11-18 ENCOUNTER — Ambulatory Visit (INDEPENDENT_AMBULATORY_CARE_PROVIDER_SITE_OTHER): Payer: Medicare Other | Admitting: Neurology

## 2015-11-18 ENCOUNTER — Telehealth: Payer: Self-pay | Admitting: *Deleted

## 2015-11-18 VITALS — BP 94/66 | HR 96 | Ht 69.0 in | Wt 176.8 lb

## 2015-11-18 DIAGNOSIS — I63132 Cerebral infarction due to embolism of left carotid artery: Secondary | ICD-10-CM

## 2015-11-18 DIAGNOSIS — Z794 Long term (current) use of insulin: Secondary | ICD-10-CM | POA: Diagnosis not present

## 2015-11-18 DIAGNOSIS — N182 Chronic kidney disease, stage 2 (mild): Secondary | ICD-10-CM

## 2015-11-18 DIAGNOSIS — I255 Ischemic cardiomyopathy: Secondary | ICD-10-CM | POA: Diagnosis not present

## 2015-11-18 DIAGNOSIS — E1122 Type 2 diabetes mellitus with diabetic chronic kidney disease: Secondary | ICD-10-CM | POA: Diagnosis not present

## 2015-11-18 DIAGNOSIS — R251 Tremor, unspecified: Secondary | ICD-10-CM

## 2015-11-18 DIAGNOSIS — Z9889 Other specified postprocedural states: Secondary | ICD-10-CM

## 2015-11-18 DIAGNOSIS — E785 Hyperlipidemia, unspecified: Secondary | ICD-10-CM | POA: Insufficient documentation

## 2015-11-18 LAB — CUP PACEART REMOTE DEVICE CHECK: MDC IDC SESS DTM: 20170209160855

## 2015-11-18 NOTE — Progress Notes (Signed)
STROKE NEUROLOGY FOLLOW UP NOTE  NAME: Lucas Morgan: 03/08/1941  REASON FOR VISIT: stroke follow up HISTORY FROM: Patient and chart  Today we had the pleasure of seeing Lucas Morgan in follow-up at our [facility]. Pt was accompanied by Niece.   History Summary Lucas Morgan is a 75 y.o. male with history of DM, HLD, CAD with previous MI, AAA, renal insufficiency, and recent stroke was admitted on 05/27/2015 for worsening speech and right hemiparesis. MRI showed primarily left MCA infarct but also punctate right MCA/PCA infarcts. MRA showed moderate intracranial stenosis. Carotid Doppler showed left ICA 60-79% stenosis, therefore CTA neck was done showed 60% left ICA stenosis and dissection flap extending to the central lumen. Although his stroke was most likely due to left ICA stenosis and dissection distal to stenosis, however, there is also right MCA/PCA punctate infarcts, raising concern for cardioembolic infarcts. Therefore TEE was done, no SOE or PFO, but EF 30-35% down from previous to 50-55% in September. Loop recorder placed after. LDL 60 and A1c 7.5. Vascular surgery consulted for consideration of left CEA, but was decided to perform after patient has more recovery. He was put on dural antiplatelet for 3 months on discharge. At that time patient still has aphasia with mild right hemiparesis (hand > leg).  re-admitted on 06/14/2015 for gait disturbance, dizziness and blurry vision. 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 Loop recorder interrogated, no A. fib found. Vascular surgery consulted again, performed left CEA on 06/17/2015. Patient tolerated well. However, developed acute on chronic CHF, cardiology consulted, treated with Lasix, but caused hypotension which was further treated with dopamine. 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 After stabilization, he was discharged with dual antiplatelet and Zocor.  Follow up 08/14/15 - the patient has been doing better.  Blood pressure is still on the low side, today 107/67 in clinic. His wound lisinopril 5 mg and Coreg 3.125 mg for blood pressure. Glucose at home 120-150. Has not seen cardiology Dr. Wyline Mood yet. Complaint of right wrist, right arm, right shoulder pain, feeling tight, and right hand and arm shakiness on exertion. Still has mild to moderate expressive aphasia. Loop recorder so far no A. fib. Patient follow-up with vascular surgery on 07/23/15 and seems doing well.  Interval History During the interval time, pt has been doing well. Right wrist and shoulder pain resolved. Feels stronger, no need to use walker now. Able to walk without device. Had follow up with cardiology Dr. Wyline Mood and considering to repeat TEE for small mobile density at AV which could potentially to be the source of emboli. Loop recorder no afib detected so far. Pt complains of morning hand tremor and difficulty pouring coffee. Has tried trazodone and cymbalta in the past, not able to tolerate. BP at low side 99/66, but niece said when he is active, BP is OK. He is on 2.5mg  lisinoprill and 3.125mg  of coreg now. Denies depression. On Remeron 7.5mg  daily.  REVIEW OF SYSTEMS: Full 14 system review of systems performed and notable only for those listed below and in HPI above, all others are negative:  Constitutional:   Cardiovascular:  Ear/Nose/Throat:  Hearing loss Skin:  Eyes:   Respiratory:   Gastroitestinal:   Genitourinary:  Hematology/Lymphatic:   Endocrine:  Musculoskeletal:  Joint pain, back pain,  aching muscles, neck pain Allergy/Immunology:   Neurological:  numbness, speech difficulty, weakness Psychiatric: depression, nervous/anxiety Sleep: insomnia  The following represents the patient's updated allergies and side effects  list: Allergies  Allergen Reactions  . Trazodone And Nefazodone Other (See Comments)    High blood sugar    The neurologically relevant items on the patient's problem list were reviewed on today's visit.  Neurologic Examination  A problem focused neurological exam (12 or more points of the single system neurologic examination, vital signs counts as 1 point, cranial nerves count for 8 points) was performed.  Blood pressure 94/66, pulse 96, height  (1.753 m), weight 176 lb 12.8 oz (80.196 kg).  General - Well nourished, well developed, not in acute distress.  Ophthalmologic - fundi not visualized due to noncooperation.  Cardiovascular - Regular rate and rhythm.  Mental Status -  Level of arousal and orientation to time, place, and person were intact. Comprehension was intact, however, moderate nonfluent aphasia, 4/4 naming with mild deficit with repetition. Fund of Knowledge was assessed and was intact.  Cranial Nerves II - XII - II - Visual field intact OU. III, IV, VI - Extraocular movements intact. V - Facial sensation intact bilaterally. VII - Facial movement intact bilaterally. VIII - Hearing & vestibular intact bilaterally. X - Palate elevates symmetrically. XI - Chin turning & shoulder shrug intact bilaterally. XII - Tongue protrusion intact.  Motor Strength - The patient's strength was normal in all extremities except pronator drift was present on the right.  Bulk was normal and fasciculations were absent.   Motor Tone - Muscle tone was assessed at the neck and appendages and was normal.  Reflexes - The patient's reflexes were 1+ in all extremities and he had no pathological reflexes.  Sensory - Light touch, temperature/pinprick were assessed and were normal.    Coordination - right upper extremity mild dysmetria on FTN.  Intention tremor on the right hand.  Gait and Station - slow, small stride, but steady without assistant device.  Data reviewed: I  personally reviewed the images and agree with the radiology interpretations.  Ct Head Wo Contrast 05/24/2015  Well-defined area of cytotoxic edema affecting the LEFT superior frontal cortex and subcortical white matter consistent with a late acute cerebral infarction. No features to suggest proximal vascular occlusion or hemorrhagic transformation. Chronic changes as described.   MR MRA head/brain without contrast  05/25/2015 1. Acute/subacute nonhemorrhagic infarct involving the left frontal lobe to level of the precentral gyrus. 2. Additional smaller non confluent punctate areas of infarction within the more anterior left frontal lobe, the left parietal lobe, and left occipital lobe seen to follow a watershed distribution.This suggests more proximal disease, potentially in the neck, or and episode of hypotension. 3. At least 4 punctate foci of acute nonhemorrhagic infarct are noted within the right parietal and occipital lobe. Given the bilateral distribution, a central embolic source also needs to be considered. 4. Age advanced atrophy and diffuse white matter disease. 5. Moderate right and mild left mid A1 segment stenoses. 6. Moderate distal small vessel disease, most evident within the right MCA branch vessels.  MRI and MRA 06/14/2015 MRI HEAD Exam is motion degraded. New tiny acute left caudate head infarct. Several subacute infarcts demonstrate expected evolution. Largest subacute infarct left frontal lobe (with mild laminar necrosis). Scattered small subacute infarcts noted bilaterally. Remote small caudate infarct. No intracranial hemorrhage. Moderate small vessel disease type changes. Global atrophy without hydrocephalus. Polypoid opacification anterior aspect sphenoid sinus.  MRA HEAD FINDINGS Anterior circulation without medium or large size vessel significant stenosis or occlusion. Middle cerebral artery branch vessel narrowing and irregularity bilaterally. Right  vertebral artery ends in a posterior inferior cerebellar artery distribution. The right posterior inferior cerebellar artery is narrowed/ irregular and poorly delineated. No significant stenosis left vertebral artery or basilar artery. Nonvisualized anterior inferior cerebellar arteries. Mild irregularity of portions of the posterior cerebral artery bilaterally with narrowing most notable involving distal branches.  CTA Neck 05/28/2015 5 mm segment of 60% stenosis LEFT internal carotid artery associated with fibromuscular dysplasia versus atherosclerosis, and dissection flap extending to the central lumen. 50% stenosis RIGHT ICA origin. 17 mm LEFT thyroid nodule for which follow-up dedicated thyroid sonogram is recommended on a nonemergent basis.  TEE  Left Ventrical: Moderate LV dysfunction. EF 30-35% Mitral Valve: MILD MR Aortic Valve: normal AV  Tricuspid Valve: trace TR  Pulmonic Valve: normal PV Left Atrium/ Left atrial appendage: no thrombi  Atrial septum: no evidence of PFO or ASD by color Doppler flow or bubble study  Aorta: mild - moderate aortic calcifications  TTE 06/18/15 - Compared to a prior study in 05/2015, the EF has improved to 40-45%. There is calcification of the aortic valve and a possible small mobile density on the non-coronary cusp that could be a sounce of embolism.  CUS - 1-39% right internal carotid artery stenosis and 60-79% left internal carotid artery stenosis. The right vertebral artery is patent with atypical flow antegrade flow. The left vertebral artery is patent with antegrade flow. The right subclavian artery is patent with antegrade biphasic flow.  Component     Latest Ref Rng 05/25/2015  Cholesterol     0 - 200 mg/dL 161  Triglycerides     <150 mg/dL 096  HDL Cholesterol     >40 mg/dL 33 (L)  Total CHOL/HDL Ratio      3.6  VLDL     0 - 40 mg/dL 26  LDL (calc)     0 - 99 mg/dL 60  Hemoglobin E4V     4.8 - 5.6 %  7.5 (H)  Mean Plasma Glucose      169    Assessment: As you may recall, he is a 75 y.o. Caucasian male with PMH of DM, HLD, CAD with previous MI, AAA, renal insufficiency was admitted on 05/27/2015 for primarily left MCA infarct but also punctate right MCA/PCA infarcts. MRA showed moderate intracranial stenosis. CUS showed left ICA 60-79% stenosis, therefore CTA neck was done showed 60% left ICA stenosis. Although his stroke was most likely due to left ICA stenosis, however, there is also right MCA/PCA punctate infarcts, raising concern for cardioembolic infarcts. TEE was done, no SOE or PFO, but EF 30-35% down from previous to 50-55%. Loop recorder placed. LDL 60 and A1c 7.5. Vascular surgery consulted for consideration of left CEA, but was decided to perform after patient has more recovery. He was put on dural antiplatelet for 3 months on discharge. 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 Loop recorder interrogated, no A. fib found. Vascular surgery performed left CEA on 06/17/2015. 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 However, repeat 2-D echo showed AV possible small mobile density. During the interval time, the patient has been doing better. Right wrist, arm, and shoulder pain  resolved but still has mild to moderate expressive aphasia. BP on the low side. Followed up cardiology Dr. Wyline MoodBranch in 09/2015 and considering to repeat TEE for AV mobile density.   Plan:  - continue ASA and plavix and lipitor for stroke and cardiac prevention - continue neurontin low dose for numbness and pain - use wrist weight to help hand tremor especially in the morning - check BP and glucose at home - Follow up with your primary care physician for stroke risk factor modification. Recommend  maintain blood pressure goal <130/80, diabetes with hemoglobin A1c goal below 6.5% and lipids with LDL cholesterol goal below 70 mg/dL.  - follow up with loop recorder  - follow up with Dr. Wyline MoodBranch and I agree with the transesophageal echocardiogram to further evaluate the aortic valve mobile density - follow up in 4 months.   I spent more than 25 minutes of face to face time with the patient. Greater than 50% of time was spent in counseling and coordination of care. We discussed about treatment of hand tremor and follow up with cardiology for AV mobile density.    No orders of the defined types were placed in this encounter.    Meds ordered this encounter  Medications  . hydrocortisone cream 0.5 %    Sig: Apply 1 application topically 2 (two) times daily.  . Liniments (BLUE-EMU SUPER STRENGTH) CREA    Sig: Apply topically.    There are no Patient Instructions on file for this visit.  Marvel PlanJindong Avenly Roberge, MD PhD [facility] [address], [address] [place], [place] [address] [phone]

## 2015-11-18 NOTE — Telephone Encounter (Signed)
Called patient's daughters to request that they send a manual transmission from his [employer] monitor.  Patient's daughters note that patient must have unplugged monitor.  Set monitor up and sent manual transmission to resume home monitoring.  Patient's daughters are aware to call if they have questions or concerns in the future.  Requested that they keep home monitor plugged in at all times and patient's daughters verbalize understanding.

## 2015-11-18 NOTE — Patient Instructions (Addendum)
-   continue ASA and plavix and lipitor for stroke and cardiac prevention - continue neurontin low dose for numbness and pain - use wrist weight to help hand tremor especially in the morning - check BP and glucose at home - Follow up with your primary care physician for stroke risk factor modification. Recommend maintain blood pressure goal <130/80, diabetes with hemoglobin A1c goal below 6.5% and lipids with LDL cholesterol goal below 70 mg/dL.  - follow up with loop recorder  - follow up with Dr. Wyline MoodBranch and I agree with the transesophageal echocardiogram to further evaluate the aortic valve mobile density - follow up in 4 months.

## 2015-11-20 ENCOUNTER — Encounter: Payer: Self-pay | Admitting: Internal Medicine

## 2015-11-24 ENCOUNTER — Ambulatory Visit (INDEPENDENT_AMBULATORY_CARE_PROVIDER_SITE_OTHER): Payer: Medicare Other | Admitting: *Deleted

## 2015-11-24 DIAGNOSIS — I639 Cerebral infarction, unspecified: Secondary | ICD-10-CM | POA: Diagnosis not present

## 2015-11-24 NOTE — Progress Notes (Signed)
[employer] Summary Report / Loop Recorder 

## 2015-11-26 ENCOUNTER — Ambulatory Visit: Payer: Medicare Other | Admitting: Internal Medicine

## 2015-12-09 ENCOUNTER — Encounter: Payer: Self-pay | Admitting: Internal Medicine

## 2015-12-10 ENCOUNTER — Encounter: Payer: Self-pay | Admitting: Internal Medicine

## 2015-12-10 ENCOUNTER — Telehealth: Payer: Self-pay | Admitting: *Deleted

## 2015-12-10 NOTE — Telephone Encounter (Signed)
Called patient regarding tachy episode on [employer] transmission from 12/09/15.  Patient states he is dizzy "all the time" and states it may have been a little worse yesterday afternoon.  He denies syncope.  Patient has difficulty communicating over the phone.  Offered to call [name] and patient agreed.  He states that she can be reached in the morning.  Will call [name] with any recommendations from Dr. Ladona Ridgelaylor.

## 2015-12-11 NOTE — Telephone Encounter (Signed)
Spoke with Lucas Morgan.  Advised her that per Dr. Ladona Ridgelaylor, we will continue to monitor Lucas Morgan's loop recorder.  Advised her to call us if he has worsening dizziness, lightheadednes, palpitations, or syncope.  She verbalizes understanding of instructions and denies additional questions or concerns at this time.

## 2015-12-12 ENCOUNTER — Other Ambulatory Visit (HOSPITAL_COMMUNITY): Payer: Self-pay | Admitting: Family Medicine

## 2015-12-12 DIAGNOSIS — R221 Localized swelling, mass and lump, neck: Secondary | ICD-10-CM

## 2015-12-17 ENCOUNTER — Ambulatory Visit (HOSPITAL_COMMUNITY)
Admission: RE | Admit: 2015-12-17 | Discharge: 2015-12-17 | Disposition: A | Discharge status: Home / Self Care | Payer: Medicare Other | Source: Ambulatory Visit | Attending: Family Medicine | Admitting: Family Medicine

## 2015-12-22 ENCOUNTER — Ambulatory Visit (INDEPENDENT_AMBULATORY_CARE_PROVIDER_SITE_OTHER): Payer: Medicare Other | Admitting: *Deleted

## 2015-12-22 DIAGNOSIS — I639 Cerebral infarction, unspecified: Secondary | ICD-10-CM | POA: Diagnosis not present

## 2015-12-22 NOTE — Progress Notes (Signed)
[employer] Summary Report / Loop Recorder 

## 2016-01-12 ENCOUNTER — Ambulatory Visit (INDEPENDENT_AMBULATORY_CARE_PROVIDER_SITE_OTHER): Payer: Medicare Other | Admitting: Cardiology

## 2016-01-12 ENCOUNTER — Encounter: Payer: Self-pay | Admitting: Cardiology

## 2016-01-12 ENCOUNTER — Emergency Department (HOSPITAL_COMMUNITY): Payer: Medicare Other

## 2016-01-12 ENCOUNTER — Inpatient Hospital Stay (HOSPITAL_COMMUNITY)
Admission: EM | Admit: 2016-01-12 | Discharge: 2016-01-16 | DRG: 065 | Disposition: A | Discharge status: Skilled Nursing Facility | Payer: Medicare Other | Attending: Internal Medicine | Admitting: Internal Medicine

## 2016-01-12 ENCOUNTER — Encounter: Payer: Self-pay | Admitting: *Deleted

## 2016-01-12 ENCOUNTER — Encounter (HOSPITAL_COMMUNITY): Payer: Self-pay | Admitting: Emergency Medicine

## 2016-01-12 VITALS — BP 124/68 | HR 73 | Ht 69.0 in | Wt 171.0 lb

## 2016-01-12 DIAGNOSIS — I255 Ischemic cardiomyopathy: Secondary | ICD-10-CM | POA: Diagnosis present

## 2016-01-12 DIAGNOSIS — R2681 Unsteadiness on feet: Secondary | ICD-10-CM

## 2016-01-12 DIAGNOSIS — W010XXA Fall on same level from slipping, tripping and stumbling without subsequent striking against object, initial encounter: Secondary | ICD-10-CM | POA: Diagnosis present

## 2016-01-12 DIAGNOSIS — M25552 Pain in left hip: Secondary | ICD-10-CM

## 2016-01-12 DIAGNOSIS — Z79899 Other long term (current) drug therapy: Secondary | ICD-10-CM

## 2016-01-12 DIAGNOSIS — R270 Ataxia, unspecified: Secondary | ICD-10-CM

## 2016-01-12 DIAGNOSIS — R29898 Other symptoms and signs involving the musculoskeletal system: Secondary | ICD-10-CM | POA: Insufficient documentation

## 2016-01-12 DIAGNOSIS — I2510 Atherosclerotic heart disease of native coronary artery without angina pectoris: Secondary | ICD-10-CM | POA: Diagnosis not present

## 2016-01-12 DIAGNOSIS — I714 Abdominal aortic aneurysm, without rupture, unspecified: Secondary | ICD-10-CM

## 2016-01-12 DIAGNOSIS — Z823 Family history of stroke: Secondary | ICD-10-CM

## 2016-01-12 DIAGNOSIS — E785 Hyperlipidemia, unspecified: Secondary | ICD-10-CM | POA: Diagnosis present

## 2016-01-12 DIAGNOSIS — I951 Orthostatic hypotension: Secondary | ICD-10-CM | POA: Diagnosis present

## 2016-01-12 DIAGNOSIS — W19XXXA Unspecified fall, initial encounter: Secondary | ICD-10-CM

## 2016-01-12 DIAGNOSIS — N182 Chronic kidney disease, stage 2 (mild): Secondary | ICD-10-CM | POA: Diagnosis present

## 2016-01-12 DIAGNOSIS — I63411 Cerebral infarction due to embolism of right middle cerebral artery: Principal | ICD-10-CM | POA: Diagnosis present

## 2016-01-12 DIAGNOSIS — Z7902 Long term (current) use of antithrombotics/antiplatelets: Secondary | ICD-10-CM

## 2016-01-12 DIAGNOSIS — M25559 Pain in unspecified hip: Secondary | ICD-10-CM | POA: Insufficient documentation

## 2016-01-12 DIAGNOSIS — M542 Cervicalgia: Secondary | ICD-10-CM

## 2016-01-12 DIAGNOSIS — Z955 Presence of coronary angioplasty implant and graft: Secondary | ICD-10-CM

## 2016-01-12 DIAGNOSIS — Z794 Long term (current) use of insulin: Secondary | ICD-10-CM

## 2016-01-12 DIAGNOSIS — Y92009 Unspecified place in unspecified non-institutional (private) residence as the place of occurrence of the external cause: Secondary | ICD-10-CM

## 2016-01-12 DIAGNOSIS — Z8673 Personal history of transient ischemic attack (TIA), and cerebral infarction without residual deficits: Secondary | ICD-10-CM

## 2016-01-12 DIAGNOSIS — I429 Cardiomyopathy, unspecified: Secondary | ICD-10-CM

## 2016-01-12 DIAGNOSIS — I5042 Chronic combined systolic (congestive) and diastolic (congestive) heart failure: Secondary | ICD-10-CM | POA: Diagnosis present

## 2016-01-12 DIAGNOSIS — I5022 Chronic systolic (congestive) heart failure: Secondary | ICD-10-CM | POA: Diagnosis not present

## 2016-01-12 DIAGNOSIS — E1122 Type 2 diabetes mellitus with diabetic chronic kidney disease: Secondary | ICD-10-CM

## 2016-01-12 DIAGNOSIS — M810 Age-related osteoporosis without current pathological fracture: Secondary | ICD-10-CM | POA: Diagnosis present

## 2016-01-12 DIAGNOSIS — E118 Type 2 diabetes mellitus with unspecified complications: Secondary | ICD-10-CM | POA: Diagnosis present

## 2016-01-12 DIAGNOSIS — I6529 Occlusion and stenosis of unspecified carotid artery: Secondary | ICD-10-CM | POA: Diagnosis present

## 2016-01-12 DIAGNOSIS — I252 Old myocardial infarction: Secondary | ICD-10-CM

## 2016-01-12 DIAGNOSIS — G8191 Hemiplegia, unspecified affecting right dominant side: Secondary | ICD-10-CM

## 2016-01-12 DIAGNOSIS — Z7982 Long term (current) use of aspirin: Secondary | ICD-10-CM

## 2016-01-12 DIAGNOSIS — Z8249 Family history of ischemic heart disease and other diseases of the circulatory system: Secondary | ICD-10-CM

## 2016-01-12 DIAGNOSIS — I639 Cerebral infarction, unspecified: Secondary | ICD-10-CM | POA: Diagnosis present

## 2016-01-12 LAB — CBC WITH DIFFERENTIAL/PLATELET
BASOS PCT: 1 %
Basophils Absolute: 0.1 10*3/uL (ref 0.0–0.1)
Eosinophils Absolute: 0.5 10*3/uL (ref 0.0–0.7)
Eosinophils Relative: 5 %
HCT: 38.2 % — ABNORMAL LOW (ref 39.0–52.0)
Hemoglobin: 13.3 g/dL (ref 13.0–17.0)
Lymphocytes Relative: 20 %
Lymphs Abs: 2 10*3/uL (ref 0.7–4.0)
MCH: 31.4 pg (ref 26.0–34.0)
MCHC: 34.8 g/dL (ref 30.0–36.0)
MCV: 90.1 fL (ref 78.0–100.0)
MONO ABS: 0.9 10*3/uL (ref 0.1–1.0)
MONOS PCT: 9 %
NEUTROS PCT: 67 %
Neutro Abs: 6.8 10*3/uL (ref 1.7–7.7)
Platelets: 177 10*3/uL (ref 150–400)
RBC: 4.24 MIL/uL (ref 4.22–5.81)
RDW: 12.3 % (ref 11.5–15.5)
WBC: 10.2 10*3/uL (ref 4.0–10.5)

## 2016-01-12 LAB — APTT: aPTT: 28 seconds (ref 24–37)

## 2016-01-12 LAB — SAMPLE TO BLOOD BANK

## 2016-01-12 LAB — PROTIME-INR
INR: 1.09 (ref 0.00–1.49)
Prothrombin Time: 14.3 seconds (ref 11.6–15.2)

## 2016-01-12 MED ORDER — SODIUM CHLORIDE 0.9 % IV BOLUS (SEPSIS)
500.0000 mL | Freq: Once | INTRAVENOUS | Status: AC
  Administered 2016-01-12: 500 mL via INTRAVENOUS

## 2016-01-12 NOTE — Progress Notes (Signed)
Patient ID: Lucas Morgan, male   DOB: 1941/06/04, 75 y.o.   MRN: [id number]     Clinical Summary Lucas Morgan is a 75 y.o.male seen today for follow up of the following medical problems.    1. CAD/ICM/Chronic systolic HF - prior inferior MI 10/2008 with DES to LCX and staged PCI of the LAD with a DES.  - last cath 12/2009 with patent stents, severe diagonal disease that was opened with cutting balloon angioplasty. - echo 05/2014 LVEF 50-55%, abnormal diastolic function, multiple WMAs.  - 05/2015 TEE LVEF 30-35% - 06/2015 TTE LVEF 40-45%, grade I diastolic dysfunction - medications for CHF limited due to soft bp's. From neuro notes trying to keep bp reasonable due to continued obstructive cerebral vascular disease.    - no SOB or DOE. No LE edema. No recent chest pain. Limiting sodium intake. Compliant with meds. Avoiding NSAIDS  2. Hyperlipidemia - compliant with statin  3. Abdominal aortic aneurysm - incidental finding on lumbar MRI 02/2013 - AAA [place] 09/2014 2.9 x 2.9 cm - AAA [place] 09/2015 2.9 x 2.9 cm  - denies any recent abdominal symptoms  4. 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 He subsequently had left carotid endarectomy done as this was the likely etiology. TEE without source of emboli, loop recorder placed and has had no significant arrhythmias.  - he is on ASA and plavix for secondary prevention per neuro   5. Carotid stenosis - s/p left CEA 06/2015 in setting of CVA. He continues to be followed by vascular.   Past Medical History  Diagnosis Date  . Diabetes mellitus   . Dyslipidemia   . MI (myocardial infarction) (HCC) 11/09/2008    2.5 x 23 Xience V DES to the CFX  . AAA (abdominal aortic aneurysm) (HCC)     a. Abd U/S 7/14: mild aneurysmal dilatation 3x3 cm; cholelithiasis without evid of cholecystitis => repeat 1 year  . Coronary artery disease   . Arthritis     stenosis, lumbar region  . Renal insufficiency   . Stroke (HCC)   . CHF (congestive heart  failure) (HCC)   . Osteoporosis      Allergies  Allergen Reactions  . Trazodone And Nefazodone Other (See Comments)    High blood sugar     Current Outpatient Prescriptions  Medication Sig Dispense Refill  . aspirin 81 MG tablet Take 1 tablet (81 mg total) by mouth daily.    Marland Kitchen atorvastatin (LIPITOR) 80 MG tablet Take 1 tablet (80 mg total) by mouth daily. 90 tablet 3  . bisacodyl (DULCOLAX) 5 MG EC tablet Take 5 mg by mouth daily as needed for moderate constipation.    . carvedilol (COREG) 3.125 MG tablet Take 1 tablet (3.125 mg total) by mouth 2 (two) times daily with a meal. 60 tablet 0  . clopidogrel (PLAVIX) 75 MG tablet Take 1 tablet (75 mg total) by mouth daily. 30 tablet 0  . gabapentin (NEURONTIN) 100 MG capsule TAKE 3 CAPSULES(300 MG) BY MOUTH THREE TIMES DAILY 90 capsule 3  . HYDROcodone-acetaminophen (NORCO) 10-325 MG tablet Take 1 tablet by mouth 4 (four) times daily as needed.    . hydrocortisone cream 0.5 % Apply 1 application topically 2 (two) times daily.    . insulin glargine (LANTUS) 100 UNIT/ML injection Inject 75 Units into the skin at bedtime.    . Liniments (BLUE-EMU SUPER STRENGTH) CREA Apply topically.    Marland Kitchen lisinopril (PRINIVIL,ZESTRIL) 2.5 MG tablet Take 2.5 mg by  mouth daily.    . mirtazapine (REMERON) 7.5 MG tablet Take 1 tablet (7.5 mg total) by mouth at bedtime. For depression/sleep 30 tablet 1  . pantoprazole (PROTONIX) 40 MG tablet Take 1 tablet (40 mg total) by mouth daily. 30 tablet 0  . tamsulosin (FLOMAX) 0.4 MG CAPS capsule Take 1 capsule (0.4 mg total) by mouth daily. 30 capsule 0   No current facility-administered medications for this visit.     Past Surgical History  Procedure Laterality Date  . Knee surgery    . Coronary stent placement    . Back surgery  2015    lumbar fusion  . Joint replacement Left   . 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 Lumbar laminectomy/decompression microdiscectomy N/A 10/18/2014      Procedure: LUMBAR LAMINECTOMY/DECOMPRESSION MICRODISCECTOMYLUMBAR THREE-FOUR ;  Surgeon: Temple PaciniHenry A Pool, MD;  Location: [facility];  Service: Neurosurgery;  Laterality: N/A;  . Coronary angioplasty    . Ep implantable device N/A 05/26/2015    Procedure: Loop Recorder Insertion;  Surgeon: Marinus MawGregg W Taylor, MD;  [facility];  Service: Cardiovascular;  Laterality: N/A;  . Tee without cardioversion N/A 05/26/2015    Procedure: TRANSESOPHAGEAL ECHOCARDIOGRAM (TEE);  Surgeon: Vesta MixerPhilip J Nahser, MD;  [facility];  Service: Cardiovascular;  Laterality: N/A;  . Endarterectomy Left 06/17/2015    Procedure: Left Carotid ENDARTERECTOMY with Livia SnellenXenosure Patch;  Surgeon: Chuck Hinthristopher S Dickson, MD;  [facility];  Service: Vascular;  Laterality: Left;     Allergies  Allergen Reactions  . Trazodone And Nefazodone Other (See Comments)    High blood sugar      Family History  Problem Relation Age of Onset  . Heart attack Brother     x2 brothers  . CAD Brother   . Stroke Mother      Social History Lucas Morgan reports that he has never smoked. He has never used smokeless tobacco. Lucas Morgan reports that he does not drink alcohol.   Review of Systems CONSTITUTIONAL: No weight loss, fever, chills, weakness or fatigue.  HEENT: Eyes: No visual loss, blurred vision, double vision or yellow sclerae.No hearing loss, sneezing, congestion, runny nose or sore throat.  SKIN: No rash or itching.  CARDIOVASCULAR: per HPI RESPIRATORY: No shortness of breath, cough or sputum.  GASTROINTESTINAL: No anorexia, nausea, vomiting or diarrhea. No abdominal pain or blood.  GENITOURINARY: No burning on urination, no polyuria NEUROLOGICAL: No headache, dizziness, syncope, paralysis, ataxia, numbness or tingling in the extremities. No change in bowel or bladder control.  MUSCULOSKELETAL: No muscle, back pain, joint pain or stiffness.  LYMPHATICS: No enlarged nodes. No history of splenectomy.   PSYCHIATRIC: No history of depression or anxiety.  ENDOCRINOLOGIC: No reports of sweating, cold or heat intolerance. No polyuria or polydipsia.  Marland Kitchen.   Physical Examination Filed Vitals:   01/12/16 1142  BP: 124/68  Pulse: 73   Filed Vitals:   01/12/16 1142  Height: 5\' 9"  (1.753 m)  Weight: 171 lb (77.565 kg)    Gen: resting comfortably, no acute distress HEENT: no scleral icterus, pupils equal round and reactive, no palptable cervical adenopathy,  CV: RRR, no m/r/g, no jvd Resp: Clear to auscultation bilaterally GI: abdomen is soft, non-tender, non-distended, normal bowel sounds, no hepatosplenomegaly MSK: extremities are warm, no edema.  Skin: warm, no rash Neuro:  no focal deficits Psych: appropriate affect   Diagnostic Studies Cath 12/2009 PROCEDURAL FINDINGS: The  right coronary artery is small and nondominant. There is no significant obstructive disease.  Left mainstem: The left main is widely patent. There is no significant stenosis. The left main divides into the LAD and left circumflex.  LAD: The LAD is patent throughout its course. There is a widely patent stent in the proximal LAD with no significant in-stent restenosis. There are luminal irregularities throughout the mid LAD without obstructive disease. The first diagonal is a large Jessalyn Hinojosa with severe ostial narrowing. There is TIMI III flow but the ostium of the diagonal appears hypodense with 95-99% stenosis.  Left circumflex: The circumflex is patent. The first OM Scotty Pinder has a patent stent and it is a large Savahanna Almendariz. The stent has no significant restenosis. The AV groove circumflex courses down and has minor luminal irregularities but no significant stenosis is noted. The left circumflex is dominant and supplies a small left PDA.  FINAL ASSESSMENT: 1. Severe ostial diagonal stenosis with successful cutting balloon  angioplasty. 2. Continued patency of the left anterior descending  (coronary artery)  and obtuse marginal stents with otherwise nonobstructive coronary  artery disease.  10/2009 MPI IMPRESSION: Large perfusion defect identified at the lateral and inferior walls of the left ventricle with evidence of significant reperfusion at the lateral wall portion of the defect on resting exam. Mildly decreased left ventricle ejection fraction of 43%. Hypokinesia of the lateral and inferior walls of the left ventricle.  05/2014 Echo LVEF 50-55%, abnormal diastolic function.   02/2015 ABI Normal bilaterally  06/2015 echo Study Conclusions  - Left ventricle: The cavity size was normal. Wall thickness was normal. Systolic function was mildly to moderately reduced. The estimated ejection fraction was in the range of 40% to 45%. Diffuse hypokinesis. LV apical false tendon. Doppler parameters are consistent with abnormal left ventricular relaxation (grade 1 diastolic dysfunction). The E/e&' ratio is between 8-15, suggesting indeterminate LV filling pressure. - Aortic valve: Calcified with a mobile density that appears attached to the non-coronary cusp. There was no regurgitation. - Mitral valve: Sclerotic leaflet tips. Trivial MR. - Left atrium: The atrium was normal in size. - Right atrium: The atrium was mildly dilated. - Atrial septum: Mobile IAS. PFO cannot be excluded. - Tricuspid valve: There was trivial regurgitation.  Impressions:  - Compared to a prior study in 05/2015, the EF has improved to 40-45%. There is calcification of the aortic valve and a possible small mobile density on the non-coronary cusp that could be a sounce of embolism.   05/2015 Carotid US Summary: Findings suggest 1-39% right internal carotid artery stenosis and 60-79% left internal carotid artery stenosis. The right vertebral artery exhibits an atypical antegrade waveform. which suggests distal stenosis likely. The left vertebral artery is patent  with antegrade flow.  05/2015 TEE Study Conclusions  - Left ventricle: Systolic function was moderately to severely reduced. The estimated ejection fraction was in the range of 30% to 35%. - Aortic valve: No evidence of vegetation. - Mitral valve: No evidence of vegetation. There was mild regurgitation. - Left atrium: No evidence of thrombus in the atrial cavity or appendage. - Atrial septum: No defect or patent foramen ovale was identified. - Tricuspid valve: No evidence of vegetation.    Assessment and Plan  1.CAD/ICM/Chronic systolic HF - medical therapy limited by low bp's -  no current symptoms - continue current meds  2. Hyperlipidemia - continue statin, we will request labs from pcp  3. AAA - stable by last [place], continue to monitor  4. CVA -  secondary prevention per neuro - do not recommend repeating TEE, previous showed calcified lambl's excresence, would not change management  5. Carotid stenosis - continue to follow with vascular   F/u 3 months      Antoine Poche, M.D., F.A.C.C.

## 2016-01-12 NOTE — ED Notes (Signed)
Pt c/o left hip pain after falling earlier today. Per family pt will not bear weight on that leg. Pt blood sugar after lantus was 274.

## 2016-01-12 NOTE — Patient Instructions (Signed)

## 2016-01-12 NOTE — ED Provider Notes (Signed)
CSN: [id number]     Arrival date & time 01/12/16  2245 History  By signing my name below, I, Linus GalasMaharshi Patel, attest that this documentation has been prepared under the direction and in the presence of Houston Sireneter Le, MDat 2320 PM Electronically Signed: Linus GalasMaharshi Patel, ED Scribe. 01/13/2016. 11:27 PM.   Chief Complaint  Patient presents with  . Hip Pain   The history is provided by the patient. No language interpreter was used.   HPI Comments: Lucas Morgan is a [age] y.o. male who presents to the Emergency Department with a PMHx of DM complaining of left hip pain with ambulation s/p fall at 8 AM this morning.  He states he tripped over his feet causing him to fall. Pt also reports after he fell he is feeling weak and dizzy when he ambulates.Pt denies hitting his head. Pt denies loss of consciousness, N/V/D, or any other complaints at this time. Pt does not smoke or drink. Pt lives with his daughter.   [facility]   Past Medical History  Diagnosis Date  . Diabetes mellitus   . Dyslipidemia   . MI (myocardial infarction) (HCC) 11/09/2008    2.5 x 23 Xience V DES to the CFX  . AAA (abdominal aortic aneurysm) (HCC)     a. Abd U/S 7/14: mild aneurysmal dilatation 3x3 cm; cholelithiasis without evid of cholecystitis => repeat 1 year  . Coronary artery disease   . Arthritis     stenosis, lumbar region  . Renal insufficiency   . Stroke (HCC)   . CHF (congestive heart failure) (HCC)   . Osteoporosis    Past Surgical History  Procedure Laterality Date  . Knee surgery    . Coronary stent placement    . Back surgery  2015    lumbar fusion  . Joint replacement Left   . 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 Lumbar laminectomy/decompression microdiscectomy N/A 10/18/2014    Procedure: LUMBAR LAMINECTOMY/DECOMPRESSION MICRODISCECTOMYLUMBAR THREE-FOUR ;  Surgeon: Temple PaciniHenry A Pool, MD;  Location: [facility]URO ORS;  Service: Neurosurgery;  Laterality: N/A;   . Coronary angioplasty    . Ep implantable device N/A 05/26/2015    Procedure: Loop Recorder Insertion;  Surgeon: Marinus MawGregg W Taylor, MD;  [facility];  Service: Cardiovascular;  Laterality: N/A;  . Tee without cardioversion N/A 05/26/2015    Procedure: TRANSESOPHAGEAL ECHOCARDIOGRAM (TEE);  Surgeon: Vesta MixerPhilip J Nahser, MD;  [facility];  Service: Cardiovascular;  Laterality: N/A;  . Endarterectomy Left 06/17/2015    Procedure: Left Carotid ENDARTERECTOMY with Livia SnellenXenosure Patch;  Surgeon: Chuck Hinthristopher S Dickson, MD;  [facility];  Service: Vascular;  Laterality: Left;   Family History  Problem Relation Age of Onset  . Heart attack Brother     x2 brothers  . CAD Brother   . Stroke Mother    Social History  Substance Use Topics  . Smoking status: Never Smoker   . Smokeless tobacco: Never Used  . Alcohol Use: No  lives at home Live with daughter  Review of Systems  Constitutional: Negative for fever.  Gastrointestinal: Negative for nausea, vomiting and diarrhea.  Musculoskeletal: Positive for arthralgias.  Neurological: Positive for dizziness and weakness.  All other systems reviewed and are negative.  Allergies  Trazodone and nefazodone  Home Medications   Prior to Admission medications   Medication Sig Start Date End Date Taking? Authorizing Provider  aspirin 81  MG tablet Take 1 tablet (81 mg total) by mouth daily. 06/19/15   Shanker Levora Dredge, MD  atorvastatin (LIPITOR) 80 MG tablet Take 1 tablet (80 mg total) by mouth daily. 09/17/15   Antoine Poche, MD  bisacodyl (DULCOLAX) 5 MG EC tablet Take 5 mg by mouth daily as needed for moderate constipation.    Historical Provider, MD  carvedilol (COREG) 3.125 MG tablet Take 1 tablet (3.125 mg total) by mouth 2 (two) times daily with a meal. 06/19/15   Maretta Bees, MD  clopidogrel (PLAVIX) 75 MG tablet Take 1 tablet (75 mg total) by mouth daily. 06/19/15   Shanker Levora Dredge, MD  gabapentin (NEURONTIN) 100  MG capsule TAKE 3 CAPSULES(300 MG) BY MOUTH THREE TIMES DAILY 09/15/15   Marvel Plan, MD  HYDROcodone-acetaminophen (NORCO) 10-325 MG tablet Take 1 tablet by mouth 4 (four) times daily as needed. 09/11/15   Historical Provider, MD  hydrocortisone cream 0.5 % Apply 1 application topically 2 (two) times daily.    Historical Provider, MD  insulin glargine (LANTUS) 100 UNIT/ML injection Inject 75 Units into the skin at bedtime.    Historical Provider, MD  Liniments (BLUE-EMU SUPER STRENGTH) CREA Apply topically.    Historical Provider, MD  lisinopril (PRINIVIL,ZESTRIL) 2.5 MG tablet Take 2.5 mg by mouth daily.    Historical Provider, MD  mirtazapine (REMERON) 7.5 MG tablet Take 1 tablet (7.5 mg total) by mouth at bedtime. For depression/sleep 06/11/15   Evlyn Kanner Love, PA-C  pantoprazole (PROTONIX) 40 MG tablet Take 1 tablet (40 mg total) by mouth daily. 06/19/15   Shanker Levora Dredge, MD  tamsulosin (FLOMAX) 0.4 MG CAPS capsule Take 1 capsule (0.4 mg total) by mouth daily. 06/19/15   Shanker Levora Dredge, MD   BP 141/69 mmHg  Pulse 85  Temp(Src) 97.9 F (36.6 C) (Oral)  Resp 19  Ht 5\' 9"  (1.753 m)  Wt 170 lb (77.111 kg)  BMI 25.09 kg/m2  SpO2 97%  Vital signs normal   Physical Exam  Constitutional: He appears well-nourished.  Non-toxic appearance. He does not appear ill. No distress.  Elderly frail man  HENT:  Head: Normocephalic and atraumatic.  Right Ear: External ear normal.  Left Ear: External ear normal.  Nose: Nose normal. No mucosal edema or rhinorrhea.  Mouth/Throat: Oropharynx is clear and moist and mucous membranes are normal. No dental abscesses or uvula swelling.  Eyes: Conjunctivae and EOM are normal.  Scaring of left cornea. 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  Neck: Normal range of motion and full passive range of motion without pain. Neck supple.  Cardiovascular: Normal rate, regular rhythm and normal heart sounds.  Exam reveals no gallop and no friction rub.   No murmur  heard. Pulmonary/Chest: Effort normal and breath sounds normal. No respiratory distress. He has no wheezes. He has no rhonchi. He has no rales. He exhibits no tenderness and no crepitus.  Abdominal: Soft. Normal appearance and bowel sounds are normal. He exhibits no distension. There is no tenderness. There is no rebound and no guarding.  Musculoskeletal: Normal range of motion. He exhibits tenderness. He exhibits no edema.  No shortening of lower extremity, no rotation, but does have left hip pain with flexion of knee  Neurological: He is alert. He has normal strength. No cranial nerve deficit.  Cooperative, follows commands, appears diffusely weak  Skin: Skin is warm, dry and intact. No rash noted. No erythema. No pallor.  Psychiatric: His speech is normal and behavior  is normal. His mood appears not anxious.  Flat affect  Nursing note and vitals reviewed.  ED Course  Procedures   Medications  sodium chloride 0.9 % bolus 500 mL (0 mLs Intravenous Stopped 01/13/16 0102)  sodium chloride 0.9 % bolus 500 mL (0 mLs Intravenous Stopped 01/13/16 0344)  HYDROcodone-acetaminophen (NORCO/VICODIN) 5-325 MG per tablet 1 tablet (1 tablet Oral Given 01/13/16 0317)    DIAGNOSTIC STUDIES: Oxygen Saturation is 97% on room air, normal by my interpretation.    COORDINATION OF CARE: 11:19 PMWill order blood work and x-rays of left hip and chest. Discussed treatment plan with pt at bedside and pt agreed to plan.Patient refused any medication for pain at this time.  Patient had x-ray of his left hip for presumed left hip fracture probably impacted, however he did not have evidence of a hip fracture. At 12:20 AM CT of the hip was ordered because of his complaints of pain when he ambulates.  1:40 AM the CT of his hip does not show any acute fracture either of the hip or the pelvis.  Nursing staff attempted to get patient to stand up at his bedside and ambulate however they state he was extremely unsteady and had  a shuffling gait. His daughter states he has never done that prior to today. At this point a CT of his head was done and a urinalysis was ordered.  Patient was given hydrocodone or his chronic back pain that he normally takes.  Patient's urinalysis did not show sign of infection. It was felt patient should be admitted to get further testing to try to further out why he has a new gait abnormality since he fell. MRI is not available here at night. Patient may have had a stroke.  04:00 Dr Conley Rolls, hospitalist, will admit.   Labs Review  Results for orders placed or performed during the hospital encounter of 01/12/16  Comprehensive metabolic panel  Result Value Ref Range   Sodium 138 135 - 145 mmol/L   Potassium 4.6 3.5 - 5.1 mmol/L   Chloride 104 101 - 111 mmol/L   CO2 24 22 - 32 mmol/L   Glucose, Bld 304 (H) 65 - 99 mg/dL   BUN 19 6 - 20 mg/dL   Creatinine, Ser 1.61 (H) 0.61 - 1.24 mg/dL   Calcium 8.8 (L) 8.9 - 10.3 mg/dL   Total Protein 6.8 6.5 - 8.1 g/dL   Albumin 3.3 (L) 3.5 - 5.0 g/dL   AST 18 15 - 41 U/L   ALT 11 (L) 17 - 63 U/L   Alkaline Phosphatase 68 38 - 126 U/L   Total Bilirubin 0.4 0.3 - 1.2 mg/dL   GFR calc non Af Amer 49 (L) >60 mL/min   GFR calc Af Amer 57 (L) >60 mL/min   Anion gap 10 5 - 15  CBC with Differential  Result Value Ref Range   WBC 10.2 4.0 - 10.5 K/uL   RBC 4.24 4.22 - 5.81 MIL/uL   Hemoglobin 13.3 13.0 - 17.0 g/dL   HCT 09.6 (L) 04.5 - 40.9 %   MCV 90.1 78.0 - 100.0 fL   MCH 31.4 26.0 - 34.0 pg   MCHC 34.8 30.0 - 36.0 g/dL   RDW 81.1 91.4 - 78.2 %   Platelets 177 150 - 400 K/uL   Neutrophils Relative % 67 %   Neutro Abs 6.8 1.7 - 7.7 K/uL   Lymphocytes Relative 20 %   Lymphs Abs 2.0 0.7 - 4.0 K/uL  Monocytes Relative 9 %   Monocytes Absolute 0.9 0.1 - 1.0 K/uL   Eosinophils Relative 5 %   Eosinophils Absolute 0.5 0.0 - 0.7 K/uL   Basophils Relative 1 %   Basophils Absolute 0.1 0.0 - 0.1 K/uL  Lactic acid, plasma  Result Value Ref Range    Lactic Acid, Venous 1.7 0.5 - 2.0 mmol/L  Protime-INR  Result Value Ref Range   Prothrombin Time 14.3 11.6 - 15.2 seconds   INR 1.09 0.00 - 1.49  APTT  Result Value Ref Range   aPTT 28 24 - 37 seconds  Urinalysis, Routine w reflex microscopic (not at [facility])  Result Value Ref Range   Color, Urine YELLOW YELLOW   APPearance CLEAR CLEAR   Specific Gravity, Urine 1.010 1.005 - 1.030   pH 6.0 5.0 - 8.0   Glucose, UA >1000 (A) NEGATIVE mg/dL   Hgb urine dipstick NEGATIVE NEGATIVE   Bilirubin Urine NEGATIVE NEGATIVE   Ketones, ur NEGATIVE NEGATIVE mg/dL   Protein, ur NEGATIVE NEGATIVE mg/dL   Nitrite NEGATIVE NEGATIVE   Leukocytes, UA NEGATIVE NEGATIVE  Urine microscopic-add on  Result Value Ref Range   Squamous Epithelial / LPF NONE SEEN NONE SEEN   WBC, UA 0-5 0 - 5 WBC/hpf   RBC / HPF 0-5 0 - 5 RBC/hpf   Bacteria, UA RARE (A) NONE SEEN  Sample to Blood Bank  Result Value Ref Range   Blood Bank Specimen SAMPLE AVAILABLE FOR TESTING    Sample Expiration 01/15/2016    Laboratory interpretation all normal except Except for hyperglycemia and renal insufficiency   Imaging Review Dg Chest 1 View  01/12/2016  CLINICAL DATA:  Left hip pain after a fall tonight. History of diabetes, heart disease, stroke, CHF. EXAM: CHEST 1 VIEW COMPARISON:  CT chest 12/15/2015.  Chest 12/15/2015. FINDINGS: Normal heart size and pulmonary vascularity. Loop recorder demonstrated. Diffuse interstitial pattern to the lungs consistent with chronic fibrosis. No focal airspace disease or consolidation. No blunting of costophrenic angles. No pneumothorax. Mediastinal contours appear intact. IMPRESSION: Diffuse pulmonary fibrosis similar to previous studies. No evidence of active pulmonary disease. Electronically Signed   By: Burman Nieves M.D.   On: 01/12/2016 23:53   Dg Hip Unilat With Pelvis 2-3 Views Left  01/12/2016  CLINICAL DATA:  Left hip pain after a fall tonight. EXAM: DG HIP (WITH OR WITHOUT PELVIS) 2-3V  LEFT COMPARISON:  None. FINDINGS: Degenerative changes in both hips with narrowing and sclerosis of the superior acetabulum and small osteophyte formation. The pelvis and left hip appear intact. No evidence of acute fracture or dislocation. No focal bone lesion or bone destruction. SI joints and symphysis pubis are nondisplaced. Vascular calcifications are present. Soft tissues are unremarkable. IMPRESSION: No acute bony abnormalities. Electronically Signed   By: Burman Nieves M.D.   On: 01/12/2016 23:51    Ct Head Wo Contrast  01/13/2016  CLINICAL DATA:  Fall.  Unsteady gait. EXAM: CT HEAD WITHOUT CONTRAST TECHNIQUE: Contiguous axial images were obtained from the base of the skull through the vertex without intravenous contrast. COMPARISON:  12/15/2015 FINDINGS: Mild cerebral atrophy. Mild ventricular dilatation consistent with central atrophy. Patchy low-attenuation changes in the deep white matter consistent with small vessel ischemia. Focal areas of encephalomalacia in the left frontal lobe and in the left parietal lobe consistent with old infarcts. No mass effect or midline shift. No abnormal extra-axial fluid collections. Gray-white matter junctions are distinct. Basal cisterns are not effaced. No evidence of acute intracranial hemorrhage.  No depressed skull fractures. Mucosal thickening in the paranasal sinuses with opacification of a few ethmoid air cells. Mastoid air cells are not opacified. Vascular calcifications. Old calcification and deformity of the left globe. IMPRESSION: No acute intracranial abnormalities. Mild chronic atrophy and small vessel ischemic changes. Old left infarcts. Probable inflammatory changes in the paranasal sinuses. Electronically Signed   By: Burman Nieves M.D.   On: 01/13/2016 02:01   Ct Hip Left Wo Contrast  01/13/2016  CLINICAL DATA:  Patient fell at home. Left hip pain on weight-bearing. EXAM: CT OF THE LEFT HIP WITHOUT CONTRAST TECHNIQUE: Multidetector CT imaging  of the left hip was performed according to the standard protocol. Multiplanar CT image reconstructions were also generated. COMPARISON:  Left hip 01/12/2016.  CT abdomen and pelvis 03/03/2015. FINDINGS: Mild degenerative changes in the left hip with narrowing of the superior acetabular joint space. Mild calcification in the lateral and medial hip joint suggesting degenerative calcification. Left hip appears intact. No evidence of acute fracture or dislocation. The bone cortex appears intact. No focal bone lesion or bone destruction. Visualized portion of the left pelvis and pubic rami appear intact. Soft tissues are unremarkable. No evidence of hematoma or infiltration in the left hip. Incidental note of enlarged prostate with mild prostate calcification. IMPRESSION: Degenerative changes in the left hip. No acute or displaced fracture identified. Electronically Signed   By: Burman Nieves M.D.   On: 01/13/2016 01:07      I have personally reviewed and evaluated these images and lab results as part of my medical decision-making.    MDM   Final diagnoses:  Fall at home, initial encounter  Hip pain, acute, left  Unsteady gait   Plan admission  Devoria Albe, MD, FACEP   I personally performed the services described in this documentation, which was scribed in my presence. The recorded information has been reviewed and considered.  Devoria Albe, MD, Concha Pyo, MD 01/13/16 [phone]

## 2016-01-13 ENCOUNTER — Observation Stay (HOSPITAL_COMMUNITY): Payer: Medicare Other

## 2016-01-13 ENCOUNTER — Encounter (HOSPITAL_COMMUNITY): Payer: Self-pay | Admitting: Internal Medicine

## 2016-01-13 ENCOUNTER — Emergency Department (HOSPITAL_COMMUNITY): Payer: Medicare Other

## 2016-01-13 DIAGNOSIS — Y92099 Unspecified place in other non-institutional residence as the place of occurrence of the external cause: Secondary | ICD-10-CM

## 2016-01-13 DIAGNOSIS — I714 Abdominal aortic aneurysm, without rupture: Secondary | ICD-10-CM

## 2016-01-13 DIAGNOSIS — I429 Cardiomyopathy, unspecified: Secondary | ICD-10-CM

## 2016-01-13 DIAGNOSIS — Y92009 Unspecified place in unspecified non-institutional (private) residence as the place of occurrence of the external cause: Secondary | ICD-10-CM

## 2016-01-13 DIAGNOSIS — M25559 Pain in unspecified hip: Secondary | ICD-10-CM | POA: Insufficient documentation

## 2016-01-13 DIAGNOSIS — I255 Ischemic cardiomyopathy: Secondary | ICD-10-CM | POA: Diagnosis not present

## 2016-01-13 DIAGNOSIS — I951 Orthostatic hypotension: Secondary | ICD-10-CM

## 2016-01-13 DIAGNOSIS — R270 Ataxia, unspecified: Secondary | ICD-10-CM | POA: Diagnosis not present

## 2016-01-13 DIAGNOSIS — M25552 Pain in left hip: Secondary | ICD-10-CM

## 2016-01-13 DIAGNOSIS — W19XXXA Unspecified fall, initial encounter: Secondary | ICD-10-CM

## 2016-01-13 LAB — LACTIC ACID, PLASMA: Lactic Acid, Venous: 1.7 mmol/L (ref 0.5–2.0)

## 2016-01-13 LAB — GLUCOSE, CAPILLARY
GLUCOSE-CAPILLARY: 203 mg/dL — AB (ref 65–99)
Glucose-Capillary: 167 mg/dL — ABNORMAL HIGH (ref 65–99)
Glucose-Capillary: 209 mg/dL — ABNORMAL HIGH (ref 65–99)
Glucose-Capillary: 230 mg/dL — ABNORMAL HIGH (ref 65–99)

## 2016-01-13 LAB — URINALYSIS, ROUTINE W REFLEX MICROSCOPIC
Bilirubin Urine: NEGATIVE
HGB URINE DIPSTICK: NEGATIVE
Ketones, ur: NEGATIVE mg/dL
LEUKOCYTES UA: NEGATIVE
Nitrite: NEGATIVE
Protein, ur: NEGATIVE mg/dL
SPECIFIC GRAVITY, URINE: 1.01 (ref 1.005–1.030)
pH: 6 (ref 5.0–8.0)

## 2016-01-13 LAB — URINE MICROSCOPIC-ADD ON: SQUAMOUS EPITHELIAL / LPF: NONE SEEN

## 2016-01-13 LAB — COMPREHENSIVE METABOLIC PANEL
ALBUMIN: 3.3 g/dL — AB (ref 3.5–5.0)
ALT: 11 U/L — AB (ref 17–63)
AST: 18 U/L (ref 15–41)
Alkaline Phosphatase: 68 U/L (ref 38–126)
Anion gap: 10 (ref 5–15)
BUN: 19 mg/dL (ref 6–20)
CHLORIDE: 104 mmol/L (ref 101–111)
CO2: 24 mmol/L (ref 22–32)
CREATININE: 1.38 mg/dL — AB (ref 0.61–1.24)
Calcium: 8.8 mg/dL — ABNORMAL LOW (ref 8.9–10.3)
GFR calc Af Amer: 57 mL/min — ABNORMAL LOW (ref >60)
GFR calc non Af Amer: 49 mL/min — ABNORMAL LOW (ref >60)
Glucose, Bld: 304 mg/dL — ABNORMAL HIGH (ref 65–99)
POTASSIUM: 4.6 mmol/L (ref 3.5–5.1)
SODIUM: 138 mmol/L (ref 135–145)
Total Bilirubin: 0.4 mg/dL (ref 0.3–1.2)
Total Protein: 6.8 g/dL (ref 6.5–8.1)

## 2016-01-13 LAB — CBC
HCT: 38 % — ABNORMAL LOW (ref 39.0–52.0)
Hemoglobin: 13.1 g/dL (ref 13.0–17.0)
MCH: 31.1 pg (ref 26.0–34.0)
MCHC: 34.5 g/dL (ref 30.0–36.0)
MCV: 90.3 fL (ref 78.0–100.0)
PLATELETS: 175 10*3/uL (ref 150–400)
RBC: 4.21 MIL/uL — ABNORMAL LOW (ref 4.22–5.81)
RDW: 12.4 % (ref 11.5–15.5)
WBC: 8.2 10*3/uL (ref 4.0–10.5)

## 2016-01-13 LAB — VITAMIN B12: Vitamin B-12: 356 pg/mL (ref 180–914)

## 2016-01-13 LAB — CREATININE, SERUM
CREATININE: 1.24 mg/dL (ref 0.61–1.24)
GFR, EST NON AFRICAN AMERICAN: 56 mL/min — AB (ref >60)

## 2016-01-13 LAB — TSH: TSH: 0.709 u[IU]/mL (ref 0.350–4.500)

## 2016-01-13 MED ORDER — GABAPENTIN 100 MG PO CAPS
100.0000 mg | ORAL_CAPSULE | Freq: Three times a day (TID) | ORAL | Status: DC
  Administered 2016-01-13 – 2016-01-16 (×10): 100 mg via ORAL
  Filled 2016-01-13 (×10): qty 1

## 2016-01-13 MED ORDER — SODIUM CHLORIDE 0.9 % IV SOLN: INTRAVENOUS | Status: DC

## 2016-01-13 MED ORDER — MIRTAZAPINE 15 MG PO TABS
7.5000 mg | ORAL_TABLET | Freq: Every day | ORAL | Status: DC
  Administered 2016-01-13 – 2016-01-15 (×3): 7.5 mg via ORAL
  Filled 2016-01-13 (×3): qty 1

## 2016-01-13 MED ORDER — SODIUM CHLORIDE 0.9 % IV SOLN
INTRAVENOUS | Status: AC
  Administered 2016-01-13: 17:00:00 via INTRAVENOUS

## 2016-01-13 MED ORDER — CLOPIDOGREL BISULFATE 75 MG PO TABS
75.0000 mg | ORAL_TABLET | Freq: Every day | ORAL | Status: DC
  Administered 2016-01-13 – 2016-01-16 (×4): 75 mg via ORAL
  Filled 2016-01-13 (×4): qty 1

## 2016-01-13 MED ORDER — ACETAMINOPHEN 650 MG RE SUPP: 650.0000 mg | Freq: Four times a day (QID) | RECTAL | Status: DC | PRN

## 2016-01-13 MED ORDER — ACETAMINOPHEN 325 MG PO TABS: 650.0000 mg | ORAL_TABLET | Freq: Four times a day (QID) | ORAL | Status: DC | PRN

## 2016-01-13 MED ORDER — SODIUM CHLORIDE 0.9 % IV BOLUS (SEPSIS)
500.0000 mL | Freq: Once | INTRAVENOUS | Status: AC
  Administered 2016-01-13: 500 mL via INTRAVENOUS

## 2016-01-13 MED ORDER — INSULIN ASPART 100 UNIT/ML ~~LOC~~ SOLN
0.0000 [IU] | Freq: Every day | SUBCUTANEOUS | Status: DC
  Administered 2016-01-13 – 2016-01-14 (×2): 2 [IU] via SUBCUTANEOUS

## 2016-01-13 MED ORDER — HEPARIN SODIUM (PORCINE) 5000 UNIT/ML IJ SOLN
5000.0000 [IU] | Freq: Three times a day (TID) | INTRAMUSCULAR | Status: DC
  Administered 2016-01-13 – 2016-01-16 (×10): 5000 [IU] via SUBCUTANEOUS
  Filled 2016-01-13 (×8): qty 1

## 2016-01-13 MED ORDER — ATORVASTATIN CALCIUM 40 MG PO TABS
80.0000 mg | ORAL_TABLET | Freq: Every day | ORAL | Status: DC
  Administered 2016-01-13 – 2016-01-16 (×4): 80 mg via ORAL
  Filled 2016-01-13 (×4): qty 2

## 2016-01-13 MED ORDER — ASPIRIN EC 81 MG PO TBEC
81.0000 mg | DELAYED_RELEASE_TABLET | Freq: Every day | ORAL | Status: DC
  Administered 2016-01-13 – 2016-01-16 (×4): 81 mg via ORAL
  Filled 2016-01-13 (×4): qty 1

## 2016-01-13 MED ORDER — OXYCODONE-ACETAMINOPHEN 5-325 MG PO TABS
1.0000 | ORAL_TABLET | ORAL | Status: DC | PRN
Start: 2016-01-13 — End: 2016-01-16
  Administered 2016-01-13 (×3): 2 via ORAL
  Administered 2016-01-14 – 2016-01-15 (×4): 1 via ORAL
  Administered 2016-01-15 – 2016-01-16 (×2): 2 via ORAL
  Filled 2016-01-13 (×4): qty 1
  Filled 2016-01-13 (×5): qty 2

## 2016-01-13 MED ORDER — TAMSULOSIN HCL 0.4 MG PO CAPS
0.4000 mg | ORAL_CAPSULE | Freq: Every day | ORAL | Status: DC
  Administered 2016-01-13 – 2016-01-16 (×4): 0.4 mg via ORAL
  Filled 2016-01-13 (×4): qty 1

## 2016-01-13 MED ORDER — INSULIN GLARGINE 100 UNIT/ML ~~LOC~~ SOLN
35.0000 [IU] | Freq: Every day | SUBCUTANEOUS | Status: DC
  Administered 2016-01-13 – 2016-01-15 (×3): 35 [IU] via SUBCUTANEOUS
  Filled 2016-01-13 (×6): qty 0.35

## 2016-01-13 MED ORDER — HYDROCODONE-ACETAMINOPHEN 5-325 MG PO TABS
1.0000 | ORAL_TABLET | Freq: Once | ORAL | Status: AC
  Administered 2016-01-13: 1 via ORAL
  Filled 2016-01-13: qty 1

## 2016-01-13 MED ORDER — INSULIN GLARGINE 100 UNIT/ML ~~LOC~~ SOLN
75.0000 [IU] | Freq: Every day | SUBCUTANEOUS | Status: DC
  Filled 2016-01-13: qty 0.75

## 2016-01-13 MED ORDER — BISACODYL 5 MG PO TBEC
5.0000 mg | DELAYED_RELEASE_TABLET | Freq: Every day | ORAL | Status: DC | PRN
  Administered 2016-01-14 – 2016-01-15 (×2): 5 mg via ORAL
  Filled 2016-01-13 (×2): qty 1

## 2016-01-13 MED ORDER — LISINOPRIL 5 MG PO TABS
2.5000 mg | ORAL_TABLET | Freq: Every day | ORAL | Status: DC
  Administered 2016-01-13 – 2016-01-15 (×3): 2.5 mg via ORAL
  Filled 2016-01-13 (×3): qty 1

## 2016-01-13 MED ORDER — PANTOPRAZOLE SODIUM 40 MG PO TBEC
40.0000 mg | DELAYED_RELEASE_TABLET | Freq: Every day | ORAL | Status: DC
  Administered 2016-01-13 – 2016-01-16 (×4): 40 mg via ORAL
  Filled 2016-01-13 (×4): qty 1

## 2016-01-13 MED ORDER — CARVEDILOL 3.125 MG PO TABS
3.1250 mg | ORAL_TABLET | Freq: Two times a day (BID) | ORAL | Status: DC
  Administered 2016-01-13 – 2016-01-15 (×5): 3.125 mg via ORAL
  Filled 2016-01-13 (×5): qty 1

## 2016-01-13 MED ORDER — INSULIN ASPART 100 UNIT/ML ~~LOC~~ SOLN
0.0000 [IU] | Freq: Three times a day (TID) | SUBCUTANEOUS | Status: DC
  Administered 2016-01-13: 3 [IU] via SUBCUTANEOUS
  Administered 2016-01-13 (×2): 5 [IU] via SUBCUTANEOUS
  Administered 2016-01-14 (×3): 3 [IU] via SUBCUTANEOUS
  Administered 2016-01-15: 11 [IU] via SUBCUTANEOUS
  Administered 2016-01-15: 3 [IU] via SUBCUTANEOUS
  Administered 2016-01-16: 2 [IU] via SUBCUTANEOUS
  Administered 2016-01-16: 3 [IU] via SUBCUTANEOUS

## 2016-01-13 NOTE — Evaluation (Signed)
Physical Therapy Evaluation Patient Details Name: Lucas Morgan Chesler MRN: [id number] DOB: 05/24/1941 Today's Date: 01/13/2016   History of Present Illness  Lucas Morgan Hert is an 75 y.o. 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 He was able to see his cardiologist yesterday (Dr Wyline MoodBranch) for follow up on his cardiac condition, but once home, his daughter was not able to help him as he couldn't ambulate. It was n't clear if he has weakness or due to pain. Evaluation in the ER showed head CT showing no acute process, electrolytes were unremarkable, and BS was in the 300's. His Cr was 1.38, and his UA, CBC, lactic acid, was negative. His CXR was negative. He had a hip CT of the hip, showing no Fx and no hematoma. Attempted to ambulate him was difficult, as he had required assistance, and thus hospitalist was asked to admit him for further evaluation  Clinical Impression  Pt received in bed, dtr present, and was agreeable to PT evaluation.  Pt lives with his dtr and grandson, and states that he was not normally using anything to walk with, he was independent with dressing, but required assistance for bathing.  Upon evaluation today, pt demonstrated supine<>[facility], and sit<>stand with Mod A and RW.  Pt demonstrated orthostatic hypotension when standing with BP readings as follows: Supine: 127/76, HR: 69bpm Sitting: 115/64, HR: 72bpm Standing: 91/58, HR: 94bpm Pt expressed dizziness in standing after PT inquired if he was dizzy.  Pt was then able to sit on the EOB, and perform seated exercises, however he demonstrated posterior LOB that required Mod A to correct and prevent him from falling backwards onto the bed.  Re-check of standing BP after exercises was 85/62, HR: 95bpm.  Pt was then  assisted back into the bed, and BP returned to 134/61, HR: 98bpm.  Pt began to Morgan/o neck pain at the end of the tx.  RN notified.   Due to pt's high risk for falling, orthostatic hypotension, and history of falls, it is recommended that he be d/Morgan with 24/7 supervision to either home with HHPT vs SNF.     Follow Up Recommendations Home health PT;SNF;Supervision/Assistance - 24 hour    Equipment Recommendations       Recommendations for Other Services       Precautions / Restrictions Precautions Precautions: Fall Precaution Comments: reason for admission - pt has had 2 falls in the last week Restrictions Weight Bearing Restrictions: No      Mobility  Bed Mobility Overal bed mobility: Needs Assistance Bed Mobility: Supine to Sit     Supine to sit: Min guard;HOB elevated        Transfers Overall transfer level: Needs assistance Equipment used: Rolling walker (2 wheeled) Transfers: Sit to/from Stand Sit to Stand: Mod assist         General transfer comment: x2 standing trials.  Pt requires vc's for safe hand placement, and uses moment to assist with transition to upright.  Once standing, pt does not express that he is dizzy unless asked, and seems to minimize symptoms.  BP: 91/58 and 85/62 during standing trials. RN notified.   Ambulation/Gait Ambulation/Gait assistance:  (NT due to orthostatic hypotension with Morgan/o dizziness, and neck pain. )  Stairs            Wheelchair Mobility    Modified Rankin (Stroke Patients Only)       Balance Overall balance assessment: Needs assistance Sitting-balance support: Bilateral upper extremity supported Sitting balance-Leahy Scale: Fair Sitting balance - Comments: Pt demonstrates poor core stability and demonstrated 1 significant LOB where PT had to provide Mod A to prevent pt from falling backwards while sitting on EOB.   Postural control: Posterior lean Standing balance support: Bilateral upper  extremity supported Standing balance-Leahy Scale: Fair Standing balance comment: Pt shifting his weight several times during BP reading as if loosing balance with static standing.                              Pertinent Vitals/Pain Pain Assessment: Faces Faces Pain Scale: Hurts even more Pain Location: neck R > L    Home Living   Living Arrangements: Children (Pt lives with his dtr and grandson who both work odd shifts) Available Help at Discharge: Family (Dtr states there are periods of time where he is alone during the day. ) Type of Home: House Home Access: Ramped entrance     Home Layout: One level Home Equipment: Environmental consultant - 2 wheels;Bedside commode;Shower seat;Wheelchair - manual;Electric scooter;Grab bars - tub/shower      Prior Function Level of Independence: Needs assistance   Gait / Transfers Assistance Needed: Pt ambulates without AD despite previous balance deficits  ADL's / Homemaking Assistance Needed: independent with dressing, and some assistance with bathing.         Hand Dominance        Extremity/Trunk Assessment   Upper Extremity Assessment: Overall WFL for tasks assessed           Lower Extremity Assessment: Generalized weakness;LLE deficits/detail   LLE Deficits / Details: knee extension: 3-/5, hip flexion 3-/5     Communication   Communication: Expressive difficulties  Cognition Arousal/Alertness: Awake/alert Behavior During Therapy: WFL for tasks assessed/performed Overall Cognitive Status: Within Functional Limits for tasks assessed                      General Comments      Exercises General Exercises - Lower Extremity Ankle Circles/Pumps: AROM;Both;20 reps;Seated Long Arc Quad: Strengthening;Both;10 reps;Seated Hip Flexion/Marching: Strengthening;Both;10 reps;Seated;Limitations Hip Flexion/Marching Limitations: Pt sitting on the EOB, and demonstrated significnat LOB while attempting to perform these exercises.         Assessment/Plan    PT Assessment Patient needs continued PT services  PT Diagnosis Difficulty walking;Abnormality of gait;Generalized weakness   PT Problem List Decreased strength;Decreased range of motion;Decreased activity tolerance;Decreased balance;Decreased mobility;Decreased coordination;Decreased knowledge of use of DME;Decreased safety awareness;Decreased knowledge of precautions  PT Treatment Interventions DME instruction;Gait training;Stair training;Functional mobility training;Therapeutic activities;Therapeutic exercise;Balance training;Patient/family education   PT Goals (Current goals can be found in the Care Plan section) Acute Rehab PT Goals Patient Stated Goal: Pt expressed that he wants to get stronger.  PT Goal Formulation: With patient/family Time For Goal Achievement: 01/27/16 Potential to Achieve Goals: Fair    Frequency Min 5X/week   Barriers to discharge Decreased caregiver support Both Dtr and grandson work odd shifts, and pt is home alone during portions of the day.     Co-evaluation               End of Session Equipment Utilized During Treatment: Gait belt Activity Tolerance: Patient tolerated treatment well;Patient limited  by fatigue;Patient limited by pain Patient left: in bed;with call bell/phone within reach Nurse Communication: Mobility status    Functional Assessment Tool Used: Clinical Judgement Functional Limitation: Mobility: Walking and moving around Mobility: Walking and Moving Around Current Status (Z6109): At least 40 percent but less than 60 percent impaired, limited or restricted Mobility: Walking and Moving Around Goal Status [phone]): At least 20 percent but less than 40 percent impaired, limited or restricted    Time: 0981-1914 PT Time Calculation (min) (ACUTE ONLY): 34 min   Charges:   PT Evaluation $PT Eval Moderate Complexity: 1 Procedure PT Treatments $Therapeutic Activity: 8-22 mins   PT G Codes:   PT G-Codes  **NOT FOR INPATIENT CLASS** Functional Assessment Tool Used: Clinical Judgement Functional Limitation: Mobility: Walking and moving around Mobility: Walking and Moving Around Current Status (N8295): At least 40 percent but less than 60 percent impaired, limited or restricted Mobility: Walking and Moving Around Goal Status [phone]): At least 20 percent but less than 40 percent impaired, limited or restricted    Carollee Herter, PT, DPT X: [phone]   01/13/2016, 2:49 PM

## 2016-01-13 NOTE — Clinical Social Work Placement (Signed)
   CLINICAL SOCIAL WORK PLACEMENT  NOTE  Date:  01/13/2016  Patient Details  Name: Lucas EatonJohn C Burks MRN: [id number] Date of Birth: 02/24/1941  Clinical Social Work is seeking post-discharge placement for this patient at the Skilled  Nursing Facility level of care (*CSW will initial, date and re-position this form in  chart as items are completed):  Yes   Patient/family provided with [place] Clinical Social Work Department's list of facilities offering this level of care within the geographic area requested by the patient (or if unable, by the patient's family).  Yes   Patient/family informed of their freedom to choose among providers that offer the needed level of care, that participate in Medicare, Medicaid or managed care program needed by the patient, have an available bed and are willing to accept the patient.  Yes   Patient/family informed of [place]'s ownership interest in [facility] and [facility], as well as of the fact that they are under no obligation to receive care at these facilities.  PASRR submitted to EDS on       PASRR number received on       Existing PASRR number confirmed on       FL2 transmitted to all facilities in geographic area requested by pt/family on 01/13/16     FL2 transmitted to all facilities within larger geographic area on       Patient informed that his/her managed care company has contracts with or will negotiate with certain facilities, including the following:        Yes   Patient/family informed of bed offers received.  [facility]     Physician recommends and patient chooses bed at      Patient to be [facility] on  .  Patient to be transferred to facility by       Patient family notified on   of transfer.  Name of family member notified:        PHYSICIAN       Additional Comment:  No pasarr needed per [facility].    _______________________________________________ Karn CassisStultz, Colvin Blatt Shanaberger, LCSW 01/13/2016, 4:13 PM [phone]

## 2016-01-13 NOTE — ED Notes (Signed)
Attempted to ambulate pt with one assist but pt needed 2 person assist. Gait very unsteady. Dr. Lynelle DoctorKnapp notified.

## 2016-01-13 NOTE — Care Management Note (Signed)
Case Management Note  Patient Details  Name: Lucas Morgan MRN: [id number] Date of Birth: 01/13/1941  Subjective/Objective:   Spoke with patient who answers questions appropiately. He lives at home with his daughter who drives him to appointments. He has a history of CVA and right sided weakness. Awaiting PT evaluation for further evaluation. Patient stated that he was walking with a cane but has been using a walker over the last two weeks. Patient stated that [facility] has [facility] [place] for [place]hab before when he had his stroke. He stated that he doesn't drive anymore. He stated that                  Action/Plan: To be determined.   Expected Discharge Date:                  Expected Discharge Plan:     In-House Referral:     Discharge planning Services  CM Consult  Post Acute Care Choice:    Choice offered to:     DME Arranged:    DME Agency:     HH Arranged:    HH Agency:     Status of Service:  In process, will continue to follow  Medicare Important Message Given:    Date Medicare IM Given:    Medicare IM give by:    Date Additional Medicare IM Given:    Additional Medicare Important Message give by:     If discussed at Long Length of Stay Meetings, dates discussed:    Additional Comments:  [name], Gerri Acre L, RN 01/13/2016, 12:50 PM

## 2016-01-13 NOTE — NC FL2 (Signed)
[place] MEDICAID FL2 LEVEL OF CARE SCREENING TOOL     IDENTIFICATION  Patient Name: Lucas Morgan Birthdate: [birth year] Sex: male Admission Date (Current Location): 01/12/2016  [facility] and [place] Number:  [employer] and Address:  [facility],  [address], [place] [address]      Provider Number: [id number]  Attending Physician Name and Address:  Elliot Cousin, MD  Relative Name and Phone Number:       Current Level of Care: Hospital Recommended Level of Care: Skilled Nursing Facility Prior Approval Number:    Date Approved/Denied:   PASRR Number:    Discharge Plan: SNF    Current Diagnoses: Patient Active Problem List   Diagnosis Date Noted  . Fall at home 01/13/2016  . Ataxia 01/13/2016  . Orthostatic hypotension 01/13/2016  . 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 Tremor of both hands 11/18/2015  . HLD (hyperlipidemia) 11/18/2015  . Type 2 diabetes mellitus with stage 2 chronic kidney disease, with long-term current use of insulin (HCC) 08/15/2015  . S/P carotid endarterectomy 08/15/2015  . Acute on chronic systolic congestive heart failure (HCC) 08/15/2015  . Cardiomyopathy, ischemic 06/18/2015  . Dyslipidemia 06/15/2015  . Cerebral infarction due to unspecified mechanism   . Chronic pain syndrome 06/11/2015  . Adjustment disorder with anxious mood 06/11/2015  . Aphasia S/P CVA 05/28/2015  . Right hemiparesis (HCC) 05/28/2015  . Stroke, acute, embolic (HCC) 05/27/2015  . Carotid stenosis   . Stroke (HCC) 05/24/2015  . Acute on chronic renal failure (HCC) 05/24/2015  . CVA (cerebral infarction) 05/24/2015  . Leukocytosis 05/24/2015  . Foreign body in colon 04/08/2015  . Spinal stenosis of lumbar region 10/18/2014  . Lumbar stenosis with neurogenic claudication 10/18/2014  . Thyroid nodule 03/12/2013  . AAA (abdominal aortic aneurysm) (HCC) 03/12/2013  . PULMONARY NODULE 08/14/2010  .  COUGH 06/30/2010  . HYPOTENSION 05/04/2010  . AODM 01/27/2009  . HYPERCHOLESTEROLEMIA  IIA 01/27/2009  . CAD, NATIVE VESSEL 12/26/2008    Orientation RESPIRATION BLADDER Height & Weight     Self, Time, Situation, Place  Normal Continent Weight: 168 lb 14 oz (76.6 kg) Height:   (175.3 cm)  BEHAVIORAL SYMPTOMS/MOOD NEUROLOGICAL BOWEL NUTRITION STATUS  Other (Comment) (n/a)  (n/a) Continent Diet (carb modified)  AMBULATORY STATUS COMMUNICATION OF NEEDS Skin   Extensive Assist Verbally Bruising, Skin abrasions                       Personal Care Assistance Level of Assistance  Bathing, Feeding, Dressing Bathing Assistance: Maximum assistance Feeding assistance: Limited assistance Dressing Assistance: Maximum assistance     Functional Limitations Info  Sight, Hearing, Speech Sight Info: Adequate Hearing Info: Adequate Speech Info: Adequate    SPECIAL CARE FACTORS FREQUENCY  PT (By licensed PT)     PT Frequency: 5              Contractures Contractures Info: Not present    Additional Factors Info  Insulin Sliding Scale Code Status Info: Full code Allergies Info: Trazodone and Nefazodone           Current Medications (01/13/2016):  This is the current hospital active medication list Current Facility-Administered Medications  Medication Dose Route Frequency Provider Last Rate Last Dose  . acetaminophen (TYLENOL) tablet 650 mg  650 mg Oral Q6H PRN Houston Siren, MD       Or  . acetaminophen (TYLENOL) suppository 650 mg  650 mg Rectal Q6H PRN Houston SirenPeter Le, MD      . aspirin EC tablet 81 mg  81 mg Oral Daily Houston SirenPeter Le, MD   81 mg at 01/13/16 0836  . atorvastatin (LIPITOR) tablet 80 mg  80 mg Oral Daily Houston SirenPeter Le, MD   80 mg at 01/13/16 0836  . bisacodyl (DULCOLAX) EC tablet 5 mg  5 mg Oral Daily PRN Houston SirenPeter Le, MD      . carvedilol (COREG) tablet 3.125 mg  3.125 mg Oral BID WC Houston SirenPeter Le, MD   3.125 mg at 01/13/16 0836  . clopidogrel (PLAVIX) tablet 75 mg  75 mg Oral  Daily Houston SirenPeter Le, MD   75 mg at 01/13/16 0836  . gabapentin (NEURONTIN) capsule 100 mg  100 mg Oral TID Houston SirenPeter Le, MD   100 mg at 01/13/16 0837  . heparin injection 5,000 Units  5,000 Units Subcutaneous Q8H Houston SirenPeter Le, MD   5,000 Units at 01/13/16 1349  . insulin aspart (novoLOG) injection 0-15 Units  0-15 Units Subcutaneous TID WC Houston SirenPeter Le, MD   5 Units at 01/13/16 1214  . insulin aspart (novoLOG) injection 0-5 Units  0-5 Units Subcutaneous QHS Houston SirenPeter Le, MD      . insulin glargine (LANTUS) injection 75 Units  75 Units Subcutaneous QHS Houston SirenPeter Le, MD      . lisinopril (PRINIVIL,ZESTRIL) tablet 2.5 mg  2.5 mg Oral Daily Houston SirenPeter Le, MD   2.5 mg at 01/13/16 0837  . mirtazapine (REMERON) tablet 7.5 mg  7.5 mg Oral QHS Houston SirenPeter Le, MD      . oxyCODONE-acetaminophen (PERCOCET/ROXICET) 5-325 MG per tablet 1-2 tablet  1-2 tablet Oral Q4H PRN Houston SirenPeter Le, MD   2 tablet at 01/13/16 [phone]  . pantoprazole (PROTONIX) EC tablet 40 mg  40 mg Oral Daily Houston SirenPeter Le, MD   40 mg at 01/13/16 0837  . tamsulosin (FLOMAX) capsule 0.4 mg  0.4 mg Oral Daily Houston SirenPeter Le, MD   0.4 mg at 01/13/16 [id number]     Discharge Medications: Please see discharge summary for a list of discharge medications.  Relevant Imaging Results:  Relevant Lab Results:   Additional Information SS#: [id number]  Karn CassisStultz, Krislynn Gronau [place], [place] [phone]

## 2016-01-13 NOTE — Clinical Social Work Placement (Signed)
   CLINICAL SOCIAL WORK PLACEMENT  NOTE  Date:  01/13/2016  Patient Details  Name: Lucas Morgan MRN: [id number] Date of Birth: 05/15/1941  Clinical Social Work is seeking post-discharge placement for this patient at the Skilled  Nursing Facility level of care (*CSW will initial, date and re-position this form in  chart as items are completed):  Yes   Patient/family provided with  Clinical Social Work Department's list of facilities offering this level of care within the geographic area requested by the patient (or if unable, by the patient's family).  Yes   Patient/family informed of their freedom to choose among providers that offer the needed level of care, that participate in Medicare, Medicaid or managed care program needed by the patient, have an available bed and are willing to accept the patient.  Yes   Patient/family informed of 's ownership interest in [facility] and [facility], as well as of the fact that they are under no obligation to receive care at these facilities.  PASRR submitted to EDS on       PASRR number received on       Existing PASRR number confirmed on       FL2 transmitted to all facilities in geographic area requested by pt/family on 01/13/16     FL2 transmitted to all facilities within larger geographic area on       Patient informed that his/her managed care company has contracts with or will negotiate with certain facilities, including the following:            Patient/family informed of bed offers received.  Patient chooses bed at       Physician recommends and patient chooses bed at      Patient to be transferred to   on  .  Patient to be transferred to facility by       Patient family notified on   of transfer.  Name of family member notified:        PHYSICIAN       Additional Comment:  No pasarr needed per [place] facilities.   _______________________________________________ Karn CassisStultz, Shwanda Soltis [place],  LCSW 01/13/2016, 2:55 PM [phone]

## 2016-01-13 NOTE — Care Management Obs Status (Signed)
MEDICARE OBSERVATION STATUS NOTIFICATION   Patient Details  Name: Lucas EatonJohn C Morgan MRN: [id number] Date of Birth: 07/04/1941   Medicare Observation Status Notification Given:  Yes    [name], Shadonna Benedick L, RN 01/13/2016, 12:45 PM

## 2016-01-13 NOTE — Progress Notes (Signed)
Patient is a 75-yo w/ hx DM,  CVA with right hemiparesis, s/p left CEA, ICM with EF of 30-35%, CAD-s/p stenting, AAA, who was admitted this morning by Dr. Conley RollsLe for observation and evaluation of frequent falls and left hip pain. In review of the chart, the patient was apparently orthostatic in cardiologist, Dr. Verna CzechBranch''s office on 01/12/16. In the ED, imaging studies negative for fracture. MRI of the hip was ordered, but I do not believe this is needed. He does have a loop recorder and apparently an MRI can be done with the loop recorder per my conversation with cardiology NP, Ms. Lawrence.  -Patient was orthostatic, so we'll add gentle IV fluids 24 hours cautiously with his low EF. -Physical therapy evaluated the patient and recommended  skilled nursing facility placement. -We'll decrease the dose of Lantus until his oral intake has improved. Continue sliding scale NovoLog. -Studies ordered include TSH, vitamin B12, and A1c.

## 2016-01-13 NOTE — H&P (Signed)
[facility] History and Physical  Lucas Morgan WUJ:[id number] DOB: 06/14/1941    PCP:   Kirt Boys, DO   Chief Complaint: fall and unable to ambulate.   HPI: Lucas Morgan is an 75 y.o. 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   He was able to see his cardiologist yesterday (Dr Wyline Mood) for follow up on his cardiac condition, but once home, his daughter was not able to help him as he couldn't ambulate.  It was n't clear if he has weakness or due to pain.  Evaluation in the ER showed head CT showing no acute process, electrolytes were unremarkable, and BS was in the 300's.  His Cr was 1.38, and his UA, CBC, lactic acid, was negative.  His CXR was negative.  He had a hip CT of the hip, showing no Fx and no hematoma.  Attempted to ambulate him was difficult, as he had required assistance, and thus hospitalist was asked to admit him for further evaluation.   Rewiew of Systems:  Constitutional: Negative for malaise, fever and chills. No significant weight loss or weight gain Eyes: Negative for eye pain, redness and discharge, diplopia, visual changes, or flashes of light. ENMT: Negative for ear pain, hoarseness, nasal congestion, sinus pressure and sore throat. No headaches; tinnitus, drooling, or problem swallowing. Cardiovascular: Negative for chest pain, palpitations, diaphoresis, dyspnea and peripheral edema. ; No orthopnea, PND Respiratory: Negative for cough, hemoptysis, wheezing and stridor. No pleuritic chestpain. Gastrointestinal: Negative for nausea, vomiting, diarrhea, constipation, abdominal pain, melena, blood in stool, hematemesis, jaundice and rectal bleeding.    Genitourinary: Negative for frequency, dysuria, incontinence,flank pain and hematuria; Musculoskeletal:  Negative for back pain and neck pain. Negative for swelling and trauma.;  Skin: . Negative for pruritus, rash, abrasions, bruising and skin lesion.; ulcerations Neuro: Negative for headache, lightheadedness and neck stiffness. Negative for weakness, altered level of consciousness , altered mental status, extremity weakness, burning feet, involuntary movement, seizure and syncope.  Psych: negative for anxiety, depression, insomnia, tearfulness, panic attacks, hallucinations, paranoia, suicidal or homicidal ideation    Past Medical History  Diagnosis Date  . Diabetes mellitus   . Dyslipidemia   . MI (myocardial infarction) (HCC) 11/09/2008    2.5 x 23 Xience V DES to the CFX  . AAA (abdominal aortic aneurysm) (HCC)     a. Abd U/S 7/14: mild aneurysmal dilatation 3x3 cm; cholelithiasis without evid of cholecystitis => repeat 1 year  . Coronary artery disease   . Arthritis     stenosis, lumbar region  . Renal insufficiency   . Stroke (HCC)   . CHF (congestive heart failure) (HCC)   . Osteoporosis     Past Surgical History  Procedure Laterality Date  . Knee surgery    . Coronary stent placement    . Back surgery  2015    lumbar fusion  . Joint replacement Left   . 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 Lumbar laminectomy/decompression microdiscectomy N/A 10/18/2014    Procedure: LUMBAR LAMINECTOMY/DECOMPRESSION MICRODISCECTOMYLUMBAR THREE-FOUR ;  Surgeon: Temple Pacini, MD;  Location: [facility] NEURO ORS;  Service: Neurosurgery;  Laterality: N/A;  . Coronary angioplasty    . Ep implantable device N/A 05/26/2015  Procedure: Loop Recorder Insertion;  Surgeon: Marinus Maw, MD;  Location: [facility] INVASIVE CV LAB;  Service: Cardiovascular;  Laterality: N/A;  . Tee without cardioversion N/A 05/26/2015    Procedure: TRANSESOPHAGEAL ECHOCARDIOGRAM (TEE);  Surgeon: Vesta Mixer, MD;  Location: [facility] ENDOSCOPY;  Service: Cardiovascular;  Laterality: N/A;  . Endarterectomy  Left 06/17/2015    Procedure: Left Carotid ENDARTERECTOMY with Livia Snellen Patch;  Surgeon: Chuck Hint, MD;  Location: [facility] OR;  Service: Vascular;  Laterality: Left;    Medications:  HOME MEDS: Prior to Admission medications   Medication Sig Start Date End Date Taking? Authorizing Provider  aspirin 81 MG tablet Take 1 tablet (81 mg total) by mouth daily. 06/19/15   Shanker Levora Dredge, MD  atorvastatin (LIPITOR) 80 MG tablet Take 1 tablet (80 mg total) by mouth daily. 09/17/15   Antoine Poche, MD  bisacodyl (DULCOLAX) 5 MG EC tablet Take 5 mg by mouth daily as needed for moderate constipation.    Historical Provider, MD  carvedilol (COREG) 3.125 MG tablet Take 1 tablet (3.125 mg total) by mouth 2 (two) times daily with a meal. 06/19/15   Maretta Bees, MD  clopidogrel (PLAVIX) 75 MG tablet Take 1 tablet (75 mg total) by mouth daily. 06/19/15   Shanker Levora Dredge, MD  gabapentin (NEURONTIN) 100 MG capsule TAKE 3 CAPSULES(300 MG) BY MOUTH THREE TIMES DAILY 09/15/15   Marvel Plan, MD  HYDROcodone-acetaminophen (NORCO) 10-325 MG tablet Take 1 tablet by mouth 4 (four) times daily as needed. 09/11/15   Historical Provider, MD  hydrocortisone cream 0.5 % Apply 1 application topically 2 (two) times daily.    Historical Provider, MD  insulin glargine (LANTUS) 100 UNIT/ML injection Inject 75 Units into the skin at bedtime.    Historical Provider, MD  Liniments (BLUE-EMU SUPER STRENGTH) CREA Apply topically.    Historical Provider, MD  lisinopril (PRINIVIL,ZESTRIL) 2.5 MG tablet Take 2.5 mg by mouth daily.    Historical Provider, MD  mirtazapine (REMERON) 7.5 MG tablet Take 1 tablet (7.5 mg total) by mouth at bedtime. For depression/sleep 06/11/15   Evlyn Kanner Love, PA-C  pantoprazole (PROTONIX) 40 MG tablet Take 1 tablet (40 mg total) by mouth daily. 06/19/15   Shanker Levora Dredge, MD  tamsulosin (FLOMAX) 0.4 MG CAPS capsule Take 1 capsule (0.4 mg total) by mouth daily. 06/19/15   Shanker Levora Dredge, MD      Allergies:  Allergies  Allergen Reactions  . Trazodone And Nefazodone Other (See Comments)    High blood sugar    Social History:   reports that he has never smoked. He has never used smokeless tobacco. He reports that he does not drink alcohol or use illicit drugs.  Family History: Family History  Problem Relation Age of Onset  . Heart attack Brother     x2 brothers  . CAD Brother   . Stroke Mother      Physical Exam: Filed Vitals:   01/12/16 2250 01/13/16 0000 01/13/16 0030  BP: 141/69 110/55 98/57  Pulse: 85 76 73  Temp: 97.9 F (36.6 C)    TempSrc: Oral    Resp: 19  19  Height: 5\' 9"  (1.753 m)    Weight: 77.111 kg (170 lb)    SpO2: 97% 95% 95%   Blood pressure 98/57, pulse 73, temperature 97.9 F (36.6 C), temperature source Oral, resp. rate 19, height 5\' 9"  (1.753 m), weight 77.111 kg (170 lb), SpO2 95 %.  GEN:  Pleasant patient lying in  the stretcher in no acute distress; cooperative with exam. PSYCH:  alert and oriented x4; does not appear anxious or depressed; affect is appropriate. HEENT: Mucous membranes pink and anicteric; Left eye blindness. no cervical lymphadenopathy nor thyromegaly or carotid bruit; no JVD; There were no stridor. Neck is very supple. Breasts:: Not examined CHEST WALL: No tenderness CHEST: Normal respiration, clear to auscultation bilaterally.  HEART: Regular rate and rhythm.  There are no murmur, rub, or gallops.   BACK: No kyphosis or scoliosis; no CVA tenderness ABDOMEN: soft and non-tender; no masses, no organomegaly, normal abdominal bowel sounds; no pannus; no intertriginous candida. There is no rebound and no distention. Rectal Exam: Not done EXTREMITIES: No bone or joint deformity; age-appropriate arthropathy of the hands and knees; no edema; no ulcerations.  There is no calf tenderness. Genitalia: not examined PULSES: 2+ and symmetric SKIN: Normal hydration no rash or ulceration CNS: Cranial nerves 2-12 grossly intact  no focal lateralizing neurologic deficit.  Speech is fluent; uvula elevated with phonation, facial symmetry and tongue midline. DTR are normal bilaterally, cerebella exam is intact, barbinski is negative and strengths are equaled bilaterally.  No sensory loss.   Labs on Admission:  Basic Metabolic Panel:  Recent Labs Lab 01/12/16 2327  NA 138  K 4.6  CL 104  CO2 24  GLUCOSE 304*  BUN 19  CREATININE 1.38*  CALCIUM 8.8*   Liver Function Tests:  Recent Labs Lab 01/12/16 2327  AST 18  ALT 11*  ALKPHOS 68  BILITOT 0.4  PROT 6.8  ALBUMIN 3.3*   CBC:  Recent Labs Lab 01/12/16 2327  WBC 10.2  NEUTROABS 6.8  HGB 13.3  HCT 38.2*  MCV 90.1  PLT 177    Radiological Exams on Admission: Dg Chest 1 View  01/12/2016  CLINICAL DATA:  Left hip pain after a fall tonight. History of diabetes, heart disease, stroke, CHF. EXAM: CHEST 1 VIEW COMPARISON:  CT chest 12/15/2015.  Chest 12/15/2015. FINDINGS: Normal heart size and pulmonary vascularity. Loop recorder demonstrated. Diffuse interstitial pattern to the lungs consistent with chronic fibrosis. No focal airspace disease or consolidation. No blunting of costophrenic angles. No pneumothorax. Mediastinal contours appear intact. IMPRESSION: Diffuse pulmonary fibrosis similar to previous studies. No evidence of active pulmonary disease. Electronically Signed   By: Burman NievesWilliam  Stevens M.D.   On: 01/12/2016 23:53   Ct Head Wo Contrast  01/13/2016  CLINICAL DATA:  Fall.  Unsteady gait. EXAM: CT HEAD WITHOUT CONTRAST TECHNIQUE: Contiguous axial images were obtained from the base of the skull through the vertex without intravenous contrast. COMPARISON:  12/15/2015 FINDINGS: Mild cerebral atrophy. Mild ventricular dilatation consistent with central atrophy. Patchy low-attenuation changes in the deep white matter consistent with small vessel ischemia. Focal areas of encephalomalacia in the left frontal lobe and in the left parietal lobe consistent with  old infarcts. No mass effect or midline shift. No abnormal extra-axial fluid collections. Gray-white matter junctions are distinct. Basal cisterns are not effaced. No evidence of acute intracranial hemorrhage. No depressed skull fractures. Mucosal thickening in the paranasal sinuses with opacification of a few ethmoid air cells. Mastoid air cells are not opacified. Vascular calcifications. Old calcification and deformity of the left globe. IMPRESSION: No acute intracranial abnormalities. Mild chronic atrophy and small vessel ischemic changes. Old left infarcts. Probable inflammatory changes in the paranasal sinuses. Electronically Signed   By: Burman NievesWilliam  Stevens M.D.   On: 01/13/2016 02:01   Ct Hip Left Wo Contrast  01/13/2016  CLINICAL DATA:  Patient  fell at home. Left hip pain on weight-bearing. EXAM: CT OF THE LEFT HIP WITHOUT CONTRAST TECHNIQUE: Multidetector CT imaging of the left hip was performed according to the standard protocol. Multiplanar CT image reconstructions were also generated. COMPARISON:  Left hip 01/12/2016.  CT abdomen and pelvis 03/03/2015. FINDINGS: Mild degenerative changes in the left hip with narrowing of the superior acetabular joint space. Mild calcification in the lateral and medial hip joint suggesting degenerative calcification. Left hip appears intact. No evidence of acute fracture or dislocation. The bone cortex appears intact. No focal bone lesion or bone destruction. Visualized portion of the left pelvis and pubic rami appear intact. Soft tissues are unremarkable. No evidence of hematoma or infiltration in the left hip. Incidental note of enlarged prostate with mild prostate calcification. IMPRESSION: Degenerative changes in the left hip. No acute or displaced fracture identified. Electronically Signed   By: Burman Nieves M.D.   On: 01/13/2016 01:07   Dg Hip Unilat With Pelvis 2-3 Views Left  01/12/2016  CLINICAL DATA:  Left hip pain after a fall tonight. EXAM: DG HIP (WITH  OR WITHOUT PELVIS) 2-3V LEFT COMPARISON:  None. FINDINGS: Degenerative changes in both hips with narrowing and sclerosis of the superior acetabulum and small osteophyte formation. The pelvis and left hip appear intact. No evidence of acute fracture or dislocation. No focal bone lesion or bone destruction. SI joints and symphysis pubis are nondisplaced. Vascular calcifications are present. Soft tissues are unremarkable. IMPRESSION: No acute bony abnormalities. Electronically Signed   By: Burman Nieves M.D.   On: 01/12/2016 23:51   Assessment/Plan Present on Admission:  . Fall . AAA (abdominal aortic aneurysm) (HCC) . Carotid stenosis . Dyslipidemia . 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  I don't think he had another CVA.  Given his Tx option is limited, being on DUAT, and already had Left CEA, will defer repeating MRI of the head.  He will be admitted into the hospital OBS, as he cannot ambulate.  Will consult PT and give pain meds to see if he improves enough to be discharged back to home.  We discussed that he will continue to have fall risks, as he has several prior CVAs, hemiparesis, and having known orthostasis.  Will continue his medications, though it may aggravate his orthostasis, it would be good for his cardiac condition.  If he is not better, would consult neurology.   He and his daughter will decide about disposition if he doesn't improve with PT.   Carotid stenosis:  S/p Left CEA late last year, continue with DUAT.  HLD:  Will continue max dose of   DM:  Will continue with his carb modified diet.  Use SSI.   AAA:  Stable.    Other plans as per orders. Code Status: FULL Unk Lightning, MD. FACP [facility] Pager [phone] 7pm to 7am.  01/13/2016, 4:50 AM

## 2016-01-13 NOTE — Care Management Note (Deleted)
Case Management Note  Patient Details  Name: Lucas Morgan MRN: [id number] Date of Birth: 09/05/1941  Subjective/Objective:  Spoke with patient who is from home with spouse. Patient answers questions appropriately. Patient stated that he uses a walker at home and that his daughter Lucas Morgan takes him to appointments. He stated that his daughter handles all of his medications. No home O2 or any other DME. Anticipate discharge to SNF, CSW following.                  Action/Plan:Anticipate discharge to SNF.   Expected Discharge Date:                  Expected Discharge Plan:  Skilled Nursing Facility  In-House Referral:  Clinical Social Work  Discharge planning Services  CM Consult  Post Acute Care Choice:  NA Choice offered to:  NA  DME Arranged:    DME Agency:  NA  HH Arranged:  NA HH Agency:     Status of Service:  Completed, signed off  Medicare Important Message Given:    Date Medicare IM Given:    Medicare IM give by:    Date Additional Medicare IM Given:    Additional Medicare Important Message give by:     If discussed at Long Length of Stay Meetings, dates discussed:    Additional Comments:  [name], Aristotelis Vilardi L, RN 01/13/2016, 2:55 PM

## 2016-01-13 NOTE — Clinical Social Work Note (Signed)
Clinical Social Work Assessment  Patient Details  Name: Lucas Morgan MRN: [id number] Date of Birth: 06/02/1941  Date of referral:  01/13/16               Reason for consult:  Discharge Planning                Permission sought to share information with:  Family Supports Permission granted to share information::     Name::     [name], Johnny  Agency::     Relationship::  children  Contact Information:     Housing/Transportation Living arrangements for the past 2 months:  Single Family Home Source of Information:  Patient, Adult Children Patient Interpreter Needed:  None Criminal Activity/Legal Involvement Pertinent to Current Situation/Hospitalization:  No - Comment as needed Significant Relationships:  Adult Children Lives with:  Adult Children Do you feel safe going back to the place where you live?  Yes Need for family participation in patient care:  Yes (Comment)  Care giving concerns:  Pt is alone some during day.    Social Worker assessment / plan:  CSW met with pt and pt's children at bedside. Pt alert and oriented and reports he lives with his daughter, [name] and grandson. He indicates that he fell yesterday and hit the left side of his body. Family is very involved and supportive. At baseline, pt ambulates with a walker and generally does well at home. PT worked with pt this afternoon and recommend SNF as pt is alone some during the day. This was discussed with pt. He was briefly tearful as he shared that he wants to be at home. However, his daughter reassured him that it would be very short term and he agreed. They request [facility] in [place] as pt has been there in the past.   Employment status:  Retired Insurance information:  Managed Medicare PT Recommendations:  Skilled Nursing Facility Information / Referral to community resources:  Skilled Nursing Facility  Patient/Family's Response to care:  Pt and children give permission for CSW to initiate SNF referral to  [facility].   Patient/Family's Understanding of and Emotional Response to Diagnosis, Current Treatment, and Prognosis:  Pt and children appear to be aware of admission diagnosis and treatment plan. They are concerned about pt's neck pain. RN notified.   Emotional Assessment Appearance:  Appears stated age Attitude/Demeanor/Rapport:  Other (Pleasant) Affect (typically observed):  Appropriate, Tearful/Crying Orientation:  Oriented to Self, Oriented to Place, Oriented to  Time, Oriented to Situation Alcohol / Substance use:  Not Applicable Psych involvement (Current and /or in the community):  No (Comment)  Discharge Needs  Concerns to be addressed:  Discharge Planning Concerns Readmission within the last 30 days:  No Current discharge risk:  Physical Impairment Barriers to Discharge:  Continued Medical Work up   ,  [place], LCSW 01/13/2016, 2:57 PM [phone] 

## 2016-01-14 ENCOUNTER — Observation Stay (HOSPITAL_COMMUNITY): Payer: Medicare Other

## 2016-01-14 DIAGNOSIS — I639 Cerebral infarction, unspecified: Secondary | ICD-10-CM | POA: Diagnosis present

## 2016-01-14 DIAGNOSIS — I2510 Atherosclerotic heart disease of native coronary artery without angina pectoris: Secondary | ICD-10-CM | POA: Diagnosis present

## 2016-01-14 DIAGNOSIS — I63411 Cerebral infarction due to embolism of right middle cerebral artery: Secondary | ICD-10-CM | POA: Diagnosis present

## 2016-01-14 DIAGNOSIS — M810 Age-related osteoporosis without current pathological fracture: Secondary | ICD-10-CM | POA: Diagnosis present

## 2016-01-14 DIAGNOSIS — M25552 Pain in left hip: Secondary | ICD-10-CM | POA: Diagnosis present

## 2016-01-14 DIAGNOSIS — Z8249 Family history of ischemic heart disease and other diseases of the circulatory system: Secondary | ICD-10-CM | POA: Diagnosis not present

## 2016-01-14 DIAGNOSIS — W010XXA Fall on same level from slipping, tripping and stumbling without subsequent striking against object, initial encounter: Secondary | ICD-10-CM | POA: Diagnosis present

## 2016-01-14 DIAGNOSIS — Z794 Long term (current) use of insulin: Secondary | ICD-10-CM | POA: Diagnosis not present

## 2016-01-14 DIAGNOSIS — I255 Ischemic cardiomyopathy: Secondary | ICD-10-CM | POA: Diagnosis present

## 2016-01-14 DIAGNOSIS — W19XXXD Unspecified fall, subsequent encounter: Secondary | ICD-10-CM

## 2016-01-14 DIAGNOSIS — E1122 Type 2 diabetes mellitus with diabetic chronic kidney disease: Secondary | ICD-10-CM | POA: Diagnosis present

## 2016-01-14 DIAGNOSIS — E785 Hyperlipidemia, unspecified: Secondary | ICD-10-CM | POA: Diagnosis present

## 2016-01-14 DIAGNOSIS — Y92009 Unspecified place in unspecified non-institutional (private) residence as the place of occurrence of the external cause: Secondary | ICD-10-CM | POA: Diagnosis not present

## 2016-01-14 DIAGNOSIS — Z955 Presence of coronary angioplasty implant and graft: Secondary | ICD-10-CM | POA: Diagnosis not present

## 2016-01-14 DIAGNOSIS — I6522 Occlusion and stenosis of left carotid artery: Secondary | ICD-10-CM

## 2016-01-14 DIAGNOSIS — Z7902 Long term (current) use of antithrombotics/antiplatelets: Secondary | ICD-10-CM | POA: Diagnosis not present

## 2016-01-14 DIAGNOSIS — I5042 Chronic combined systolic (congestive) and diastolic (congestive) heart failure: Secondary | ICD-10-CM | POA: Diagnosis present

## 2016-01-14 DIAGNOSIS — I714 Abdominal aortic aneurysm, without rupture: Secondary | ICD-10-CM | POA: Diagnosis present

## 2016-01-14 DIAGNOSIS — Z79899 Other long term (current) drug therapy: Secondary | ICD-10-CM | POA: Diagnosis not present

## 2016-01-14 DIAGNOSIS — R2681 Unsteadiness on feet: Secondary | ICD-10-CM | POA: Diagnosis present

## 2016-01-14 DIAGNOSIS — N182 Chronic kidney disease, stage 2 (mild): Secondary | ICD-10-CM | POA: Diagnosis present

## 2016-01-14 DIAGNOSIS — Z823 Family history of stroke: Secondary | ICD-10-CM | POA: Diagnosis not present

## 2016-01-14 DIAGNOSIS — R270 Ataxia, unspecified: Secondary | ICD-10-CM | POA: Diagnosis not present

## 2016-01-14 DIAGNOSIS — I429 Cardiomyopathy, unspecified: Secondary | ICD-10-CM | POA: Diagnosis not present

## 2016-01-14 DIAGNOSIS — I6350 Cerebral infarction due to unspecified occlusion or stenosis of unspecified cerebral artery: Secondary | ICD-10-CM | POA: Diagnosis not present

## 2016-01-14 DIAGNOSIS — G8191 Hemiplegia, unspecified affecting right dominant side: Secondary | ICD-10-CM | POA: Diagnosis present

## 2016-01-14 DIAGNOSIS — I252 Old myocardial infarction: Secondary | ICD-10-CM | POA: Diagnosis not present

## 2016-01-14 DIAGNOSIS — Z8673 Personal history of transient ischemic attack (TIA), and cerebral infarction without residual deficits: Secondary | ICD-10-CM | POA: Diagnosis not present

## 2016-01-14 DIAGNOSIS — Z7982 Long term (current) use of aspirin: Secondary | ICD-10-CM | POA: Diagnosis not present

## 2016-01-14 DIAGNOSIS — I951 Orthostatic hypotension: Secondary | ICD-10-CM | POA: Diagnosis present

## 2016-01-14 LAB — HEMOGLOBIN A1C
HEMOGLOBIN A1C: 8.6 % — AB (ref 4.8–5.6)
Mean Plasma Glucose: 200 mg/dL

## 2016-01-14 LAB — GLUCOSE, CAPILLARY
GLUCOSE-CAPILLARY: 189 mg/dL — AB (ref 65–99)
GLUCOSE-CAPILLARY: 215 mg/dL — AB (ref 65–99)
Glucose-Capillary: 154 mg/dL — ABNORMAL HIGH (ref 65–99)
Glucose-Capillary: 195 mg/dL — ABNORMAL HIGH (ref 65–99)

## 2016-01-14 LAB — BASIC METABOLIC PANEL
Anion gap: 6 (ref 5–15)
BUN: 17 mg/dL (ref 6–20)
CALCIUM: 8.5 mg/dL — AB (ref 8.9–10.3)
CHLORIDE: 106 mmol/L (ref 101–111)
CO2: 26 mmol/L (ref 22–32)
CREATININE: 1.18 mg/dL (ref 0.61–1.24)
GFR calc Af Amer: >60 mL/min (ref >60)
GFR calc non Af Amer: 59 mL/min — ABNORMAL LOW (ref >60)
GLUCOSE: 216 mg/dL — AB (ref 65–99)
Potassium: 4.1 mmol/L (ref 3.5–5.1)
Sodium: 138 mmol/L (ref 135–145)

## 2016-01-14 NOTE — Progress Notes (Signed)
Physical Therapy Treatment Patient Details Name: Lucas EatonJohn C Morgan MRN: [id number] DOB: 04/17/1941 Today's Date: 01/14/2016    History of Present Illness Lucas Morgan is an 75 y.o. 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 He was able to see his cardiologist yesterday (Dr Wyline MoodBranch) for follow up on his cardiac condition, but once home, his daughter was not able to help him as he couldn't ambulate. It was n't clear if he has weakness or due to pain. Evaluation in the ER showed head CT showing no acute process, electrolytes were unremarkable, and BS was in the 300's. His Cr was 1.38, and his UA, CBC, lactic acid, was negative. His CXR was negative. He had a hip CT of the hip, showing no Fx and no hematoma. Attempted to ambulate him was difficult, as he had required assistance, and thus hospitalist was asked to admit him for further evaluation.  XR of C-spine showed mild cervical spondylosis.     PT Comments    Pt received in bed, and was agreeable to PT tx.  Pt expressed that although he has had a stroke in the past, his L sided deficits are worse than before, and his speech is more "sluggish."  Pt continues to demonstrate orthostatic hypotension, however non-symptomatic today, therefore gait was evaluated.   Supine BP: 129/61, HR: 75bpm Sitting BP: 113/63, HR: 79bpm Standing BP: 101/54, HR: 92bpm Pt demonstrates very ataxic gait with L LE inattention.  Pt required mod/max A to ambulate 5420ft with RW due to poor advancement, and placement of L LE with ataxia.  Pt required 1-step commands with almost every step, to take bigger step with the L foot.  Pt was left sitting up in the chair, and BP at the end was 119/63, HR: 78bpm.  Continue to recommend SNF at d/c.   Follow Up Recommendations  SNF     Equipment Recommendations       Recommendations for Other Services OT consult;Speech consult (Left message for Dr. Harvel QualeMomen about recommendations for SLP and OT orders. )     Precautions / Restrictions Precautions Precautions: Fall Restrictions Weight Bearing Restrictions: No    Mobility  Bed Mobility Overal bed mobility: Needs Assistance Bed Mobility: Supine to Sit     Supine to sit: Min assist     General bed mobility comments: bed flat, and pt required some assistance to lift trunk.  Transfers Overall transfer level: Needs assistance Equipment used: Rolling walker (2 wheeled) Transfers: Sit to/from Stand Sit to Stand: Mod assist         General transfer comment: Pt required vc's for hand placement on the bed, and encouraged to not rock so much, but instead power upward to come into standing position.  Orthostatic vitals taken today, and while pt still demonstrates orthostatic hypotension, he was not symptomatic and not as severe as yesterday.   Ambulation/Gait Ambulation/Gait assistance: Max assist;Mod assist Ambulation Distance (Feet): 20 Feet Assistive device: Rolling walker (2 wheeled) Gait Pattern/deviations: Step-to pattern;Ataxic;Decreased weight shift to right     General Gait Details: Pt  demonstrates L LE ataxia with gait, as well as L hip weakness.  Pt requires constant cues to pick up L foot, and take a big step.  Pt requires assistance to shift his weight to the  right in order to advance L LE today. Pt states this is not his baseline.     Stairs            Wheelchair Mobility    Modified Rankin (Stroke Patients Only)       Balance   Sitting-balance support: Bilateral upper extremity supported;Feet supported       Standing balance support: Bilateral upper extremity supported Standing balance-Leahy Scale: Fair                      Cognition Arousal/Alertness: Awake/alert Behavior During Therapy: WFL for tasks  assessed/performed Overall Cognitive Status: Impaired/Different from baseline Area of Impairment: Safety/judgement         Safety/Judgement: Decreased awareness of deficits          Exercises General Exercises - Lower Extremity Ankle Circles/Pumps: Both;20 reps;AROM Heel Slides: Strengthening;Both;10 reps;Limitations Heel Slides Limitations: Noted ataxia of L LE with this exercise, therefore, L LE required CGA for form Hip ABduction/ADduction: Both;Strengthening;10 reps Other Exercises Other Exercises: Supine Bridge with assist to maintain  knees at midline.  x10 reps.     General Comments        Pertinent Vitals/Pain Pain Assessment: Faces Faces Pain Scale: Hurts little more Pain Location: neck discomfort    Home Living                      Prior Function            PT Goals (current goals can now be found in the care plan section) Acute Rehab PT Goals Patient Stated Goal: Pt expressed that he wants to get stronger.  PT Goal Formulation: With patient Time For Goal Achievement: 01/27/16 Progress towards PT goals: Progressing toward goals    Frequency  7X/week    PT Plan Frequency needs to be updated    Co-evaluation             End of Session Equipment Utilized During Treatment: Gait belt Activity Tolerance: Patient tolerated treatment well;Patient limited by fatigue;Patient limited by pain Patient left: in chair;with call bell/phone within reach     Time: 1610-9604 PT Time Calculation (min) (ACUTE ONLY): 31 min  Charges:  $Gait Training: 8-22 mins $Therapeutic Exercise: 8-22 mins                    G Codes:      Lucas Morgan, PT, DPT X: [id number]   01/14/2016, 2:52 PM

## 2016-01-14 NOTE — Progress Notes (Signed)
PROGRESS NOTE    SHMIEL MORTON  ZOX:[id number] DOB: 03/21/1941 DOA: 01/12/2016 PCP: Kirt Boys, DO Outpatient Specialists:  Cardiology; Herby Abraham, MD  Gastroenterology; West Bali, MD  Brief Narrative:  72 yom with a hx of CVA, L CEA last year, CMP with EF 30%, DMtype 2, HLD, and CKD presented after falling at home. CT head showed no acute process, electrolytes were unremarkable. Blood sugars were elevated. CT hip showed no Fx or hematoma. Ambulation was difficult for him so he was referred for admission.   Assessment & Plan:   Principal Problem:   Fall at home Active Problems:   CAD, NATIVE VESSEL   AAA (abdominal aortic aneurysm) (HCC)   Carotid stenosis   Right hemiparesis (HCC)   Dyslipidemia   Cardiomyopathy, ischemic   Type 2 diabetes mellitus with stage 2 chronic kidney disease, with long-term current use of insulin (HCC)   Ataxia   Orthostatic hypotension   Hip pain, acute   1. Ataxia after Fall. Possibly due to pain after fall. Spine x-ray showed mid-cervical spondylosis, but no acute abnormality. Physical therapy has evaluated the patient and has recommended a skilled nursing facility.. During physical therapy evaluation, it was noted that he had persistent functional weakness and some difficulty with his speech. The patient is a history of CVA in the past. Will check MRI of brain to evaluate for new CVA. Consult neurology. 2. Carotid stenosis. S/p left CEA last year, continue dual antiplatelet therapy. 3. AAA. Stable. 4. HLD. Continue Statins at max dose. 5. DM type 2. Glucose 216. Continue carb mod diet. Continue SSI. 6. Orthostatic hypotension. Improving with IV fluids. We'll continue fluids for 24 hours. 7. Left Hip pain. Likely related to fall. Imaging has been unrevealing. Continue physical therapy.    DVT prophylaxis: Heparin Code Status: Full Family Communication: Discussed with daughter at bedside. Disposition Plan: anticipate discharge to  SNF once improved.    Consultants:   PT   Procedures:   none  Antimicrobials:   none    Subjective: Feels improved today. Per daughter, he was able to perform some exercises, but his blood pressure dropped when he stood up. Additionally, daughter reports that he has been experiencing neck pain.   Objective: Filed Vitals:   01/13/16 1432 01/13/16 2030 01/13/16 2041 01/14/16 0608  BP: 134/61 130/68  114/59  Pulse:  70  70  Temp:  97.5 F (36.4 C)  98 F (36.7 C)  TempSrc:  Oral  Oral  Resp:  20  20  Height:      Weight:      SpO2:  95% 94% 97%    Intake/Output Summary (Last 24 hours) at 01/14/16 0736 Last data filed at 01/14/16 0609  Gross per 24 hour  Intake      0 ml  Output   1550 ml  Net  -1550 ml   Filed Weights   01/12/16 2250 01/13/16 0552  Weight: 77.111 kg (170 lb) 76.6 kg (168 lb 14 oz)    Examination: General exam: Appears calm and comfortable  Respiratory system: Clear to auscultation. Respiratory effort normal. Cardiovascular system: S1 & S2 heard, RRR. No JVD, murmurs, rubs, gallops or clicks. No pedal edema. Gastrointestinal system: Abdomen is nondistended, soft and nontender. No organomegaly or masses felt. Normal bowel sounds heard. Central nervous system: Alert and oriented. No  Gross focal neurological deficits. Extremities: Symmetric 5 x 5 power. Skin: No rashes, lesions or ulcers Psychiatry: Judgement and insight appear normal. Mood &  affect appropriate.     Data Reviewed: I have personally reviewed following labs and imaging studies  CBC:  Recent Labs Lab 01/12/16 2327 01/13/16 0607  WBC 10.2 8.2  NEUTROABS 6.8  --   HGB 13.3 13.1  HCT 38.2* 38.0*  MCV 90.1 90.3  PLT 177 175   Basic Metabolic Panel:  Recent Labs Lab 01/12/16 2327 01/13/16 0607 01/14/16 0549  NA 138  --  138  K 4.6  --  4.1  CL 104  --  106  CO2 24  --  26  GLUCOSE 304*  --  216*  BUN 19  --  17  CREATININE 1.38* 1.24 1.18  CALCIUM 8.8*  --   8.5*   GFR: Estimated Creatinine Clearance: 54.9 mL/min (by C-G formula based on Cr of 1.18). Liver Function Tests:  Recent Labs Lab 01/12/16 2327  AST 18  ALT 11*  ALKPHOS 68  BILITOT 0.4  PROT 6.8  ALBUMIN 3.3*   No results for input(s): LIPASE, AMYLASE in the last 168 hours. No results for input(s): AMMONIA in the last 168 hours. Coagulation Profile:  Recent Labs Lab 01/12/16 2327  INR 1.09   Cardiac Enzymes: No results for input(s): CKTOTAL, CKMB, CKMBINDEX, TROPONINI in the last 168 hours. BNP (last 3 results) No results for input(s): PROBNP in the last 8760 hours. HbA1C: No results for input(s): HGBA1C in the last 72 hours. CBG:  Recent Labs Lab 01/13/16 0801 01/13/16 1134 01/13/16 1646 01/13/16 2016  GLUCAP 167* 209* 203* 230*   Lipid Profile: No results for input(s): CHOL, HDL, LDLCALC, TRIG, CHOLHDL, LDLDIRECT in the last 72 hours. Thyroid Function Tests:  Recent Labs  01/13/16 1043  TSH 0.709   Anemia Panel:  Recent Labs  01/13/16 1043  VITAMINB12 356   Urine analysis:    Component Value Date/Time   COLORURINE YELLOW 01/13/2016 0255   APPEARANCEUR CLEAR 01/13/2016 0255   LABSPEC 1.010 01/13/2016 0255   PHURINE 6.0 01/13/2016 0255   GLUCOSEU >1000* 01/13/2016 0255   HGBUR NEGATIVE 01/13/2016 0255   BILIRUBINUR NEGATIVE 01/13/2016 0255   KETONESUR NEGATIVE 01/13/2016 0255   PROTEINUR NEGATIVE 01/13/2016 0255   UROBILINOGEN 1.0 05/30/2015 1022   NITRITE NEGATIVE 01/13/2016 0255   LEUKOCYTESUR NEGATIVE 01/13/2016 0255   Sepsis Labs: @LABRCNTIP (procalcitonin:4,lacticidven:4)  )No results found for this or any previous visit (from the past 240 hour(s)).       Radiology Studies: Dg Chest 1 View  01/12/2016  CLINICAL DATA:  Left hip pain after a fall tonight. History of diabetes, heart disease, stroke, CHF. EXAM: CHEST 1 VIEW COMPARISON:  CT chest 12/15/2015.  Chest 12/15/2015. FINDINGS: Normal heart size and pulmonary vascularity.  Loop recorder demonstrated. Diffuse interstitial pattern to the lungs consistent with chronic fibrosis. No focal airspace disease or consolidation. No blunting of costophrenic angles. No pneumothorax. Mediastinal contours appear intact. IMPRESSION: Diffuse pulmonary fibrosis similar to previous studies. No evidence of active pulmonary disease. Electronically Signed   By: Burman NievesWilliam  Stevens M.D.   On: 01/12/2016 23:53   Dg Cervical Spine 2 Or 3 Views  01/13/2016  CLINICAL DATA:  Pt was seen in ED last night due to fall. Pt was walking to the bathroom in his home when he fell against the wall. Pt c/o popping and stiffness in neck since. EXAM: CERVICAL SPINE - 2-3 VIEW COMPARISON:  None. FINDINGS: There is moderate mid cervical degenerative change, most notable at C5-6 and C6-7. There is 1 mm retrolisthesis of C3 on C4, likely degenerative. No  evidence for acute fracture or subluxation. Prevertebral soft tissues are normal in appearance. IMPRESSION: Mid cervical spondylosis.  No evidence for acute  abnormality. Electronically Signed   By: Norva Pavlov M.D.   On: 01/13/2016 18:46   Ct Head Wo Contrast  01/13/2016  CLINICAL DATA:  Fall.  Unsteady gait. EXAM: CT HEAD WITHOUT CONTRAST TECHNIQUE: Contiguous axial images were obtained from the base of the skull through the vertex without intravenous contrast. COMPARISON:  12/15/2015 FINDINGS: Mild cerebral atrophy. Mild ventricular dilatation consistent with central atrophy. Patchy low-attenuation changes in the deep white matter consistent with small vessel ischemia. Focal areas of encephalomalacia in the left frontal lobe and in the left parietal lobe consistent with old infarcts. No mass effect or midline shift. No abnormal extra-axial fluid collections. Gray-white matter junctions are distinct. Basal cisterns are not effaced. No evidence of acute intracranial hemorrhage. No depressed skull fractures. Mucosal thickening in the paranasal sinuses with opacification of  a few ethmoid air cells. Mastoid air cells are not opacified. Vascular calcifications. Old calcification and deformity of the left globe. IMPRESSION: No acute intracranial abnormalities. Mild chronic atrophy and small vessel ischemic changes. Old left infarcts. Probable inflammatory changes in the paranasal sinuses. Electronically Signed   By: Burman Nieves M.D.   On: 01/13/2016 02:01   Ct Hip Left Wo Contrast  01/13/2016  CLINICAL DATA:  Patient fell at home. Left hip pain on weight-bearing. EXAM: CT OF THE LEFT HIP WITHOUT CONTRAST TECHNIQUE: Multidetector CT imaging of the left hip was performed according to the standard protocol. Multiplanar CT image reconstructions were also generated. COMPARISON:  Left hip 01/12/2016.  CT abdomen and pelvis 03/03/2015. FINDINGS: Mild degenerative changes in the left hip with narrowing of the superior acetabular joint space. Mild calcification in the lateral and medial hip joint suggesting degenerative calcification. Left hip appears intact. No evidence of acute fracture or dislocation. The bone cortex appears intact. No focal bone lesion or bone destruction. Visualized portion of the left pelvis and pubic rami appear intact. Soft tissues are unremarkable. No evidence of hematoma or infiltration in the left hip. Incidental note of enlarged prostate with mild prostate calcification. IMPRESSION: Degenerative changes in the left hip. No acute or displaced fracture identified. Electronically Signed   By: Burman Nieves M.D.   On: 01/13/2016 01:07   Dg Hip Unilat With Pelvis 2-3 Views Left  01/12/2016  CLINICAL DATA:  Left hip pain after a fall tonight. EXAM: DG HIP (WITH OR WITHOUT PELVIS) 2-3V LEFT COMPARISON:  None. FINDINGS: Degenerative changes in both hips with narrowing and sclerosis of the superior acetabulum and small osteophyte formation. The pelvis and left hip appear intact. No evidence of acute fracture or dislocation. No focal bone lesion or bone destruction.  SI joints and symphysis pubis are nondisplaced. Vascular calcifications are present. Soft tissues are unremarkable. IMPRESSION: No acute bony abnormalities. Electronically Signed   By: Burman Nieves M.D.   On: 01/12/2016 23:51        Scheduled Meds: . aspirin EC  81 mg Oral Daily  . atorvastatin  80 mg Oral Daily  . carvedilol  3.125 mg Oral BID WC  . clopidogrel  75 mg Oral Daily  . gabapentin  100 mg Oral TID  . heparin  5,000 Units Subcutaneous Q8H  . insulin aspart  0-15 Units Subcutaneous TID WC  . insulin aspart  0-5 Units Subcutaneous QHS  . insulin glargine  35 Units Subcutaneous QHS  . lisinopril  2.5 mg Oral Daily  .  mirtazapine  7.5 mg Oral QHS  . pantoprazole  40 mg Oral Daily  . tamsulosin  0.4 mg Oral Daily   Continuous Infusions: . sodium chloride 50 mL/hr at 01/13/16 2105        Time spent: 25 minutes    Erick Blinks, MD [facility] Pager [phone]  If 7PM-7AM, please contact night-coverage [email or web address] Password [facility] 01/14/2016, 7:36 AM

## 2016-01-14 NOTE — Progress Notes (Signed)
Inpatient Diabetes Program Recommendations  AACE/ADA: New Consensus Statement on Inpatient Glycemic Control (2015)  Target Ranges:  Prepandial:   less than 140 mg/dL      Peak postprandial:   less than 180 mg/dL (1-2 hours)      Critically ill patients:  140 - 180 mg/dL  Results for Lucas Morgan, Lucas Morgan (MRN [id number]) as of 01/14/2016 09:19  Ref. Range 01/13/2016 08:01 01/13/2016 11:34 01/13/2016 16:46 01/13/2016 20:16 01/14/2016 07:35  Glucose-Capillary Latest Ref Range: 65-99 mg/dL 638167 (H) 756209 (H) 433203 (H) 230 (H) 189 (H)   Review of Glycemic Control  Current orders for Inpatient glycemic control: Lantus 35 units QHS, Novolog 0-15 units TID with meals, Novolog 0-5 units QHS  Inpatient Diabetes Program Recommendations Insulin - Meal Coverage: If patient is eating at least 50% of meals, please consider ordering Novolog 3 units TID with meals for meal coverage.  Thanks, Orlando PennerMarie Sofia Jaquith, RN, MSN, CDE Diabetes Coordinator Inpatient Diabetes Program [phone] (Team Pager from 8am to 5pm) [phone] ([facility] office) [phone] [facility] office) [phone] [facility] office)

## 2016-01-15 ENCOUNTER — Inpatient Hospital Stay (HOSPITAL_COMMUNITY): Payer: Medicare Other

## 2016-01-15 DIAGNOSIS — I6350 Cerebral infarction due to unspecified occlusion or stenosis of unspecified cerebral artery: Secondary | ICD-10-CM

## 2016-01-15 DIAGNOSIS — I2510 Atherosclerotic heart disease of native coronary artery without angina pectoris: Secondary | ICD-10-CM

## 2016-01-15 LAB — CBC
HEMATOCRIT: 38.9 % — AB (ref 39.0–52.0)
Hemoglobin: 13.5 g/dL (ref 13.0–17.0)
MCH: 31.5 pg (ref 26.0–34.0)
MCHC: 34.7 g/dL (ref 30.0–36.0)
MCV: 90.9 fL (ref 78.0–100.0)
Platelets: 184 10*3/uL (ref 150–400)
RBC: 4.28 MIL/uL (ref 4.22–5.81)
RDW: 12.3 % (ref 11.5–15.5)
WBC: 8.1 10*3/uL (ref 4.0–10.5)

## 2016-01-15 LAB — BASIC METABOLIC PANEL
ANION GAP: 7 (ref 5–15)
BUN: 18 mg/dL (ref 6–20)
CO2: 26 mmol/L (ref 22–32)
Calcium: 8.6 mg/dL — ABNORMAL LOW (ref 8.9–10.3)
Chloride: 104 mmol/L (ref 101–111)
Creatinine, Ser: 1.28 mg/dL — ABNORMAL HIGH (ref 0.61–1.24)
GFR, EST NON AFRICAN AMERICAN: 53 mL/min — AB (ref >60)
Glucose, Bld: 219 mg/dL — ABNORMAL HIGH (ref 65–99)
POTASSIUM: 3.9 mmol/L (ref 3.5–5.1)
SODIUM: 137 mmol/L (ref 135–145)

## 2016-01-15 LAB — GLUCOSE, CAPILLARY
GLUCOSE-CAPILLARY: 103 mg/dL — AB (ref 65–99)
GLUCOSE-CAPILLARY: 313 mg/dL — AB (ref 65–99)
GLUCOSE-CAPILLARY: 127 mg/dL — AB (ref 65–99)
Glucose-Capillary: 189 mg/dL — ABNORMAL HIGH (ref 65–99)

## 2016-01-15 LAB — ECHOCARDIOGRAM COMPLETE
Height: 69 in
Weight: 2701.96 oz

## 2016-01-15 MED ORDER — SODIUM CHLORIDE 0.9 % IV SOLN
INTRAVENOUS | Status: DC
  Administered 2016-01-15: 20:00:00 via INTRAVENOUS

## 2016-01-15 NOTE — Progress Notes (Signed)
SLP Cancellation Note  Patient Details Name: Lucas EatonJohn C Morgan MRN: [id number] DOB: 10/15/1940   Cancelled treatment:       Reason Eval/Treat Not Completed: Other (comment); Chart reviewed and screen completed with pt and daughter. Pt denies increased difficulty with speech with this admission and reports changes only to be related to his legs. His daughter confirms that pt had a stroke in September and several small strokes following with resultant expressive aphasia, however no change in his speech/language at this time. She reports that he initially was only able to speak a small amount and is actually doing much better now. Pt adamant that he would like to go home, but acknowledges that short term SNF placement for physical therapy would be beneficial. He tells me that he wants to get home to tend to his garden. Pt does exhibit some residual expressive aphasia with some circumlocutions and mild dysarthria, but pt/family report this is baseline from his previous stroke. Pt tolerating po without incident. No further acute SLP needs warranted at this time.  Thank you,  Lucas MorosDabney Ezekial Morgan, CCC-SLP [phone]    Lucas Morgan 01/15/2016, 7:21 PM

## 2016-01-15 NOTE — Discharge Summary (Signed)
Physician Discharge Summary  Lucas Morgan ZOX:[id number] DOB: 1941/03/19 DOA: 01/12/2016  PCP: Kirt Boys, DO  Admit date: 01/12/2016 Discharge date: 01/16/2016  Time spent: 35 minutes  Recommendations for Outpatient Follow-up:  1. Discharge to SNF, [facility] in [place] Level 2. Follow up with PCP   Discharge Diagnoses:  Principal Problem:   Fall at home Active Problems:   CAD, NATIVE VESSEL   AAA (abdominal aortic aneurysm) (HCC)   Carotid stenosis   Right hemiparesis (HCC)   Dyslipidemia   Cardiomyopathy, ischemic   Type 2 diabetes mellitus with stage 2 chronic kidney disease, with long-term current use of insulin (HCC)   Ataxia   Orthostatic hypotension   Hip pain, acute   Acute CVA (cerebrovascular accident) [facility])   Discharge Condition: improved  Diet recommendation: heart healthy, carb modified  Filed Weights   01/12/16 2250 01/13/16 0552  Weight: 77.111 kg (170 lb) 76.6 kg (168 lb 14 oz)    History of present illness:  33 yom with a hx of CVA, L CEA, CMP with EF 30%, DM type 2, HLD, orthostasis and fall tendencies, and CKD presented from home after falling on his left side. It was unclear whether he fell due to weakness or pain. A CT head was done in the ER but showed no acute process and his electrolytes were unremarkable.  A CXR was also done but this too was negative. CT hip was done and showed no fracture or hematoma. Hospitalist was asked to refer for evaluation since ambulation was difficult for him.  Hospital Course:  Patient was found to have an acute CVA and presented with left lower extremity weakness and ataxia. While in the ED, a CT head was done but sowed no acute process. He continued to have increased dizziness and generalized weakness. A x-ray of the c-spine was done but was negative. Carotid Dopplers did not show any significant stenosis bilaterally and his echocardiogram was relatively unchanged. An MRI brain showed scattered foci of acute nonhemorrhagic  infarction involving the posterior right MCA territory and larger acute white matter infarcts. Neurology consulted and recommended continuing dual antiplatelet therapy as well as 30 day event monitor. Physical therapy saw the patient has recommended skilled nursing facility placement. He will be discharged to [facility] in [place]. Hgb and A1c labs were also done and will continue statin and lantus for diabetes. 1. Chronic Combined systolic and diastolic congestive heart failure. Ejection fraction 40-45%. Appears unchanged from prior echocardiogram. Will continue beta blocker and ACE inhibitors 2. Carotid stenosis. S/p left CEA last year, continue dual antiplatelet therapy. 3. AAA. Stable. 4. HLD. Continue Statins at max dose. 5. DM type 2. Glucose 219. Continue carb mod diet. Continue SSI. 6. Orthostatic hypotension. Improving with IV fluids. Patient is not symptomatic on standing and was able to walk to the end of the hall without dizziness 7. Left Hip pain. Likely related to fall. Imaging has been unrevealing. Continue physical therapy.  Procedures:  Carotid doppler 5/04  ECHOStudy  Conclusions  - Left ventricle: The cavity size was normal. Wall thickness was  increased in a pattern of mild LVH. Systolic function was mildly  to moderately reduced. The estimated ejection fraction was in the  range of 40% to 45%. Diffuse hypokinesis. There is severe  hypokinesis of the inferior myocardium. Doppler parameters are  consistent with abnormal left ventricular relaxation (grade 1  diastolic dysfunction). - Aortic valve: Mildly calcified annulus. Trileaflet; moderately  calcified leaflets. Mean gradient (S): 7 mm Hg. - Mitral  valve: Calcified annulus. Mildly thickened leaflets .  There was mild regurgitation. - Right atrium: The atrium was at the upper limits of normal in  size. - Atrial septum: No defect or patent foramen ovale was identified. - Tricuspid valve: There was  trivial regurgitation. - Pulmonary arteries: Systolic pressure could not be accurately  estimated.  - Pericardium, extracardiac: There was no pericardial effusion.  Consultations:  PT  Neurology  Discharge Exam: Filed Vitals:   01/15/16 2053 01/16/16 0420  BP: 114/54 98/50  Pulse: 77 73  Temp: 98.5 F (36.9 C) 98.1 F (36.7 C)  Resp: 20 16    Examination:  General exam: Appears calm and comfortable  Respiratory system: Clear to auscultation. Respiratory effort normal. Cardiovascular system: S1 & S2 heard, RRR. No JVD, murmurs, rubs, gallops or clicks. No pedal edema. Gastrointestinal system: Abdomen is nondistended, soft and nontender. No organomegaly or masses felt. Normal bowel sounds heard. Central nervous system: Alert and oriented. No focal neurological deficits. Extremities: Symmetric 5 x 5 power. Skin: No rashes, lesions or ulcers Psychiatry: Judgement and insight appear normal. Mood & affect appropriate.    Discharge Instructions   Discharge Instructions    Diet - low sodium heart healthy    Complete by:  As directed      Increase activity slowly    Complete by:  As directed           Current Discharge Medication List    CONTINUE these medications which have CHANGED   Details  HYDROcodone-acetaminophen (NORCO) 10-325 MG tablet Take 1 tablet by mouth 4 (four) times daily as needed. Qty: 30 tablet, Refills: 0    insulin glargine (LANTUS) 100 UNIT/ML injection Inject 0.4 mLs (40 Units total) into the skin at bedtime. Qty: 10 mL, Refills: 11      CONTINUE these medications which have NOT CHANGED   Details  aspirin 81 MG tablet Take 1 tablet (81 mg total) by mouth daily.    atorvastatin (LIPITOR) 80 MG tablet Take 1 tablet (80 mg total) by mouth daily. Qty: 90 tablet, Refills: 3    bisacodyl (DULCOLAX) 5 MG EC tablet Take 5 mg by mouth daily as needed for moderate constipation.    carvedilol (COREG) 3.125 MG tablet Take 1 tablet (3.125 mg total) by  mouth 2 (two) times daily with a meal. Qty: 60 tablet, Refills: 0    clopidogrel (PLAVIX) 75 MG tablet Take 1 tablet (75 mg total) by mouth daily. Qty: 30 tablet, Refills: 0    gabapentin (NEURONTIN) 100 MG capsule TAKE 3 CAPSULES(300 MG) BY MOUTH THREE TIMES DAILY Qty: 90 capsule, Refills: 3    hydrocortisone cream 0.5 % Apply 1 application topically 2 (two) times daily.    Liniments (BLUE-EMU SUPER STRENGTH) CREA Apply topically.    lisinopril (PRINIVIL,ZESTRIL) 2.5 MG tablet Take 2.5 mg by mouth daily.    mirtazapine (REMERON) 7.5 MG tablet Take 1 tablet (7.5 mg total) by mouth at bedtime. For depression/sleep Qty: 30 tablet, Refills: 1    pantoprazole (PROTONIX) 40 MG tablet Take 1 tablet (40 mg total) by mouth daily. Qty: 30 tablet, Refills: 0    tamsulosin (FLOMAX) 0.4 MG CAPS capsule Take 1 capsule (0.4 mg total) by mouth daily. Qty: 30 capsule, Refills: 0       Allergies  Allergen Reactions  . Trazodone And Nefazodone Other (See Comments)    High blood sugar      The results of significant diagnostics from this hospitalization (including imaging, microbiology, ancillary  and laboratory) are listed below for reference.    Significant Diagnostic Studies: Dg Chest 1 View  01/12/2016  CLINICAL DATA:  Left hip pain after a fall tonight. History of diabetes, heart disease, stroke, CHF. EXAM: CHEST 1 VIEW COMPARISON:  CT chest 12/15/2015.  Chest 12/15/2015. FINDINGS: Normal heart size and pulmonary vascularity. Loop recorder demonstrated. Diffuse interstitial pattern to the lungs consistent with chronic fibrosis. No focal airspace disease or consolidation. No blunting of costophrenic angles. No pneumothorax. Mediastinal contours appear intact. IMPRESSION: Diffuse pulmonary fibrosis similar to previous studies. No evidence of active pulmonary disease. Electronically Signed   By: Burman Nieves M.D.   On: 01/12/2016 23:53   Dg Cervical Spine 2 Or 3 Views  01/13/2016  CLINICAL  DATA:  Pt was seen in ED last night due to fall. Pt was walking to the bathroom in his home when he fell against the wall. Pt c/o popping and stiffness in neck since. EXAM: CERVICAL SPINE - 2-3 VIEW COMPARISON:  None. FINDINGS: There is moderate mid cervical degenerative change, most notable at C5-6 and C6-7. There is 1 mm retrolisthesis of C3 on C4, likely degenerative. No evidence for acute fracture or subluxation. Prevertebral soft tissues are normal in appearance. IMPRESSION: Mid cervical spondylosis.  No evidence for acute  abnormality. Electronically Signed   By: Norva Pavlov M.D.   On: 01/13/2016 18:46   Ct Head Wo Contrast  01/13/2016  CLINICAL DATA:  Fall.  Unsteady gait. EXAM: CT HEAD WITHOUT CONTRAST TECHNIQUE: Contiguous axial images were obtained from the base of the skull through the vertex without intravenous contrast. COMPARISON:  12/15/2015 FINDINGS: Mild cerebral atrophy. Mild ventricular dilatation consistent with central atrophy. Patchy low-attenuation changes in the deep white matter consistent with small vessel ischemia. Focal areas of encephalomalacia in the left frontal lobe and in the left parietal lobe consistent with old infarcts. No mass effect or midline shift. No abnormal extra-axial fluid collections. Gray-white matter junctions are distinct. Basal cisterns are not effaced. No evidence of acute intracranial hemorrhage. No depressed skull fractures. Mucosal thickening in the paranasal sinuses with opacification of a few ethmoid air cells. Mastoid air cells are not opacified. Vascular calcifications. Old calcification and deformity of the left globe. IMPRESSION: No acute intracranial abnormalities. Mild chronic atrophy and small vessel ischemic changes. Old left infarcts. Probable inflammatory changes in the paranasal sinuses. Electronically Signed   By: Burman Nieves M.D.   On: 01/13/2016 02:01   Mr Maxine Glenn Head Wo Contrast  01/14/2016  CLINICAL DATA:  Scattered acute right MCA  territory infarcts. EXAM: MRA HEAD WITHOUT CONTRAST TECHNIQUE: Angiographic images of the Circle of Willis were obtained using MRA technique without intravenous contrast. COMPARISON:  None. FINDINGS: The internal carotid arteries are within normal limits from the high cervical segments through the ICA termini bilaterally. The left A1 segment is dominant. The anterior communicating artery is patent. The A1 and M1 segments are normal. The MCA bifurcations are intact. Asymmetric segmental attenuation of MCA branch vessels is noted. The left vertebral artery is the dominant vessel. The right vertebral artery terminates at the PICA. The left PICA origin is visualized and normal. Both posterior cerebral arteries originate from the basilar tip. There is some attenuation of distal PCA branch vessels bilaterally, worse on the right. IMPRESSION: 1. No significant proximal stenosis, aneurysm, or branch vessel occlusion. 2. Mild distal small vessel disease within the right MCA branch vessels and right greater than left PCA branch vessels. Electronically Signed   By: Cristal Deer  Mattern M.D.   On: 01/14/2016 17:54   Mr Brain Wo Contrast  01/14/2016  CLINICAL DATA:  New onset left-sided weakness.  Fall at home today. EXAM: MRI HEAD WITHOUT CONTRAST TECHNIQUE: Multiplanar, multiecho pulse sequences of the brain and surrounding structures were obtained without intravenous contrast. COMPARISON:  CT HEAD WITHOUT CONTRAST 01/13/2016. FINDINGS: Scattered areas of acute nonhemorrhagic infarction are evident within the posterior right MCA territory. The largest lesion measures 12 mm when and within the posterior right corona radiata. An 8 mm acute white matter infarct is present within the right centrum semi of ally. There is a punctate focus of acute infarct in the posterior limb of the right internal capsule. Additional punctate foci are present in the precentral and postcentral gyrus on the right. T2 changes are present within the  areas of acute infarction. Remote cortical infarcts and encephalomalacia involves the posterior left frontal lobe and at the junction of the left temporal and occipital lobe. Moderate periventricular white matter changes are present bilaterally as well. No acute hemorrhage is present. The ventricles are proportionate to the degree of atrophy. Remote lacunar infarcts are present in the basal ganglia and cerebellum bilaterally. The distal right vertebral artery is occluded. The left vertebral artery and basilar artery are patent. The internal carotid arteries are patent bilaterally. The left globe is shrunken with heterogeneous signal. The right globe is intact. The orbits are otherwise unremarkable. The paranasal sinuses are clear. The mastoid air cells are clear. The skullbase is within normal limits. Midline sagittal images are unremarkable. IMPRESSION: 1. Scattered foci of acute nonhemorrhagic infarction involving the posterior right MCA territory. These include lesions along the right precentral and postcentral gyrus. Larger acute/subacute white matter infarcts are present as well. 2. Remote cortical infarcts involving the posterior left frontal lobe and the posterior left temporal lobe. 3. Remote lacunar infarcts of the basal ganglia and cerebellum bilaterally. 4. Advanced diffuse periventricular white matter changes compatible with chronic microvascular ischemia. These results were called by telephone at the time of interpretation on 01/14/2016 at 4:38 pm to Dr. Durward Mallard [facility] , who verbally acknowledged these results. Electronically Signed   By: Marin Roberts M.D.   On: 01/14/2016 16:45   Ct Hip Left Wo Contrast  01/13/2016  CLINICAL DATA:  Patient fell at home. Left hip pain on weight-bearing. EXAM: CT OF THE LEFT HIP WITHOUT CONTRAST TECHNIQUE: Multidetector CT imaging of the left hip was performed according to the standard protocol. Multiplanar CT image reconstructions were also generated.  COMPARISON:  Left hip 01/12/2016.  CT abdomen and pelvis 03/03/2015. FINDINGS: Mild degenerative changes in the left hip with narrowing of the superior acetabular joint space. Mild calcification in the lateral and medial hip joint suggesting degenerative calcification. Left hip appears intact. No evidence of acute fracture or dislocation. The bone cortex appears intact. No focal bone lesion or bone destruction. Visualized portion of the left pelvis and pubic rami appear intact. Soft tissues are unremarkable. No evidence of hematoma or infiltration in the left hip. Incidental note of enlarged prostate with mild prostate calcification. IMPRESSION: Degenerative changes in the left hip. No acute or displaced fracture identified. Electronically Signed   By: Burman Nieves M.D.   On: 01/13/2016 01:07   US Carotid Bilateral  01/15/2016  CLINICAL DATA:  Small acute nonhemorrhagic right MCA territory infarctions EXAM: BILATERAL CAROTID DUPLEX ULTRASOUND TECHNIQUE: Wallace Cullens scale imaging, color Doppler and duplex ultrasound were performed of bilateral carotid and vertebral arteries in the neck. COMPARISON:  01/14/2016 FINDINGS: Criteria:  Quantification of carotid stenosis is based on velocity parameters that correlate the residual internal carotid diameter with NASCET-based stenosis levels, using the diameter of the distal internal carotid lumen as the denominator for stenosis measurement. The following velocity measurements were obtained: RIGHT ICA:  107/22 cm/sec CCA:  84/12 cm/sec SYSTOLIC ICA/CCA RATIO:  1.3 DIASTOLIC ICA/CCA RATIO:  1.9 ECA:  112 cm/sec LEFT ICA:  72/16 cm/sec CCA:  105/18 cm/sec SYSTOLIC ICA/CCA RATIO:  0.7 DIASTOLIC ICA/CCA RATIO:  0.9 ECA:  109 cm/sec RIGHT CAROTID ARTERY: Moderate heterogeneous irregular partially calcified right carotid bifurcation atherosclerosis. This narrows the proximal ICA lumen by grayscale imaging. Despite this, there is no significant velocity elevation, turbulent flow, or  hemodynamically significant stenosis. Degree of narrowing less than 50%. RIGHT VERTEBRAL ARTERY:  Antegrade LEFT CAROTID ARTERY: Similar scattered minor echogenic plaque formation. No hemodynamically significant left ICA stenosis, velocity elevation, or turbulent flow. LEFT VERTEBRAL ARTERY:  Antegrade IMPRESSION: Right greater than left carotid atherosclerosis. No hemodynamically significant stenosis. Degree of narrowing less than 50% bilaterally. Patent antegrade vertebral flow bilaterally Electronically Signed   By: Judie Petit.  Shick M.D.   On: 01/15/2016 13:56   Dg Hip Unilat With Pelvis 2-3 Views Left  01/12/2016  CLINICAL DATA:  Left hip pain after a fall tonight. EXAM: DG HIP (WITH OR WITHOUT PELVIS) 2-3V LEFT COMPARISON:  None. FINDINGS: Degenerative changes in both hips with narrowing and sclerosis of the superior acetabulum and small osteophyte formation. The pelvis and left hip appear intact. No evidence of acute fracture or dislocation. No focal bone lesion or bone destruction. SI joints and symphysis pubis are nondisplaced. Vascular calcifications are present. Soft tissues are unremarkable. IMPRESSION: No acute bony abnormalities. Electronically Signed   By: Burman Nieves M.D.   On: 01/12/2016 23:51    Microbiology: No results found for this or any previous visit (from the past 240 hour(s)).   Labs: Basic Metabolic Panel:  Recent Labs Lab 01/12/16 2327 01/13/16 0607 01/14/16 0549 01/15/16 0556  NA 138  --  138 137  K 4.6  --  4.1 3.9  CL 104  --  106 104  CO2 24  --  26 26  GLUCOSE 304*  --  216* 219*  BUN 19  --  17 18  CREATININE 1.38* 1.24 1.18 1.28*  CALCIUM 8.8*  --  8.5* 8.6*   Liver Function Tests:  Recent Labs Lab 01/12/16 2327  AST 18  ALT 11*  ALKPHOS 68  BILITOT 0.4  PROT 6.8  ALBUMIN 3.3*   No results for input(s): LIPASE, AMYLASE in the last 168 hours. No results for input(s): AMMONIA in the last 168 hours. CBC:  Recent Labs Lab 01/12/16 2327  01/13/16 0607 01/15/16 0556  WBC 10.2 8.2 8.1  NEUTROABS 6.8  --   --   HGB 13.3 13.1 13.5  HCT 38.2* 38.0* 38.9*  MCV 90.1 90.3 90.9  PLT 177 175 184   Cardiac Enzymes: No results for input(s): CKTOTAL, CKMB, CKMBINDEX, TROPONINI in the last 168 hours. BNP: BNP (last 3 results) No results for input(s): BNP in the last 8760 hours.  ProBNP (last 3 results) No results for input(s): PROBNP in the last 8760 hours.  CBG:  Recent Labs Lab 01/15/16 1118 01/15/16 1559 01/15/16 2051 01/16/16 0714 01/16/16 1122  GLUCAP 103* 313* 127* 134* 186*     Signed:  Erick Blinks, MD.  [facility] 01/16/2016, 12:17 PM  By signing my name below, I, Adron Bene, attest that this documentation has been  prepared under the direction and in the presence of Erick BlinksJehanzeb Tyshae Stair, MD. Electronically Signed: Adron BeneGreylon Gawaluck 01/16/2016 11:50am  I, Dr. Erick BlinksJehanzeb Daire Okimoto, personally performed the services described in this documentaiton. All medical record entries made by the scribe were at my direction and in my presence. I have reviewed the chart and agree that the record reflects my personal performance and is accurate and complete  Erick BlinksJehanzeb Kairi Tufo, MD, 01/16/2016 12:17 PM

## 2016-01-15 NOTE — Evaluation (Signed)
Occupational Therapy Evaluation Patient Details Name: Lucas EatonJohn C Morgan MRN: [id number] DOB: 04/18/1941 Today's Date: 01/15/2016    History of Present Illness Lucas EatonJohn C Morgan is an 75 y.o. 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 He was able to see his cardiologist yesterday (Dr Wyline MoodBranch) for follow up on his cardiac condition, but once home, his daughter was not able to help him as he couldn't ambulate. It was n't clear if he has weakness or due to pain. Evaluation in the ER showed head CT showing no acute process, electrolytes were unremarkable, and BS was in the 300's.  He had a hip CT of the hip, showing no Fx and no hematoma.   Clinical Impression   Pt awake, alert, oriented x3 this am, agreeable to OT evaluation. Pt found lying sideways on bed, as if attempted to sit up and eat but was unable to maintain position and fell backwards on the bed. Assisted pt to sitting at EOB, pt demonstrates difficulty with seated balance, as he displays posterior LOB if he does not use BUE support when sitting. With eyes closed pt leans back and to the right immediately. When seated with support at back and sides, pt is able to perform ADL tasks with modified independence to supervision. Pt sensation and coordination is intact, strength in LUE is good. Recommend SNF on discharge to work on increasing independence and safety during ADL completion and functional mobility, per chart review [facility] [place].     Follow Up Recommendations  SNF;Supervision/Assistance - 24 hour    Equipment Recommendations  None recommended by OT       Precautions / Restrictions Precautions Precautions: Fall Restrictions Weight Bearing Restrictions: No      Mobility Bed Mobility Overal  bed mobility: Needs Assistance Bed Mobility: Supine to Sit     Supine to sit: Min assist     General bed mobility comments: With bed flat, pt required assistance to lift trunk to come to sitting  Transfers Overall transfer level: Needs assistance Equipment used: Rolling walker (2 wheeled) Transfers: Sit to/from [employer] CorporationStand;Stand Pivot Transfers Sit to Stand: Mod assist Stand pivot transfers: Mod assist       General transfer comment: Pt required verbal cuing to push up from bed, not symptomatic for orthostatic hypotension. Pt completed bed<>chair transfer using rolling walker, min assist and verbal cuing for appropriate steps with LLE         ADL Overall ADL's : Needs assistance/impaired Eating/Feeding: Modified independent;Sitting                   Lower Body Dressing: Supervision/safety;Sitting/lateral leans Lower Body Dressing Details (indicate cue type and reason): Pt able to doff and donn socks while sitting in chair with back and arms, unable to perform tasks while seated without support due to decreased sitting balance                     Vision Vision Assessment?: No apparent visual deficits          Pertinent Vitals/Pain Pain Assessment: Faces Faces Pain Scale: Hurts even more Pain Location: "sore all over" Pain Descriptors / Indicators: Sore Pain Intervention(s): Limited activity within patient's tolerance;Monitored during session     Hand Dominance  Right   Extremity/Trunk Assessment Upper Extremity Assessment Upper Extremity Assessment: Overall WFL for tasks assessed (bilateral shoulder ROM approximately 50-60%; strength WFL)   Lower Extremity Assessment Lower Extremity Assessment: Defer to PT evaluation       Communication Communication Communication: Expressive difficulties   Cognition Arousal/Alertness: Awake/alert Behavior During Therapy: WFL for tasks assessed/performed Overall Cognitive Status: Impaired/Different from  baseline Area of Impairment: Safety/judgement         Safety/Judgement: Decreased awareness of deficits                    Home Living Family/patient expects to be discharged to:: Private residence Living Arrangements: Children (daughter-[name]) Available Help at Discharge: Family (pt is alone at times during day) Type of Home: House             Bathroom Shower/Tub: Engineer, civil (consulting) Toilet: Handicapped height     Home Equipment: Environmental consultant - 2 wheels;Bedside commode;Shower seat;Wheelchair - manual;Electric scooter;Grab bars - tub/shower   Additional Comments: has TTB and grab bars in shower, BSC, walker, cane, wheelchair per daughters report      Prior Functioning/Environment Level of Independence: Needs assistance  Gait / Transfers Assistance Needed: Not using AD prior to admission ADL's / Homemaking Assistance Needed: Pt reports independence in ADL tasks, daughter assists with meal prep, household tasks, medication management Communication / Swallowing Assistance Needed: aphasia Comments: Pt reports he was driving prior to admission    OT Diagnosis: Generalized weakness   OT Problem List: Decreased activity tolerance;Impaired balance (sitting and/or standing);Decreased safety awareness;Decreased knowledge of use of DME or AE    End of Session Equipment Utilized During Treatment: Gait belt;Rolling walker Nurse Communication: Other (comment) (pt left in chair-no chair alarm available)  Activity Tolerance: Patient tolerated treatment well Patient left: in chair;with call bell/phone within reach   Time: 1601-0932 OT Time Calculation (min): 23 min Charges:  OT General Charges $OT Visit: 1 Procedure OT Evaluation $OT Eval Low Complexity: 1 Procedure  Ezra Sites, OTR/L  [phone]  01/15/2016, 9:04 AM

## 2016-01-15 NOTE — Progress Notes (Signed)
Physical Therapy Treatment Patient Details Name: Lucas EatonJohn C Morgan MRN: [id number] DOB: 05/08/1941 Today's Date: 01/15/2016    History of Present Illness Lucas Morgan is an 75 y.o. 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 He was able to see his cardiologist yesterday (Dr Wyline MoodBranch) for follow up on his cardiac condition, but once home, his daughter was not able to help him as he couldn't ambulate. It was n't clear if he has weakness or due to pain. Evaluation in the ER showed head CT showing no acute process, electrolytes were unremarkable, and BS was in the 300's. His Cr was 1.38, and his UA, CBC, lactic acid, was negative. His CXR was negative. He had a hip CT of the hip, showing no Fx and no hematoma. Attempted to ambulate him was difficult, as he had required assistance, and thus hospitalist was asked to admit him for further evaluation.  XR of C-spine showed mild cervical spondylosis. MRI of the head 5/3 shows Scattered foci of acute nonhemorrhagic infarction involving the posterior right  CA territory. These include lesions along the right precentral and postcentral gyrus. Larger acute/subacute white matter infarcts are present as well.    PT Comments    Pt received in bed, and was agreeable to PT tx.  Pt demonstrated improvement with all transfers at Min A level today, and ambulated 4960ft with RW, but continues to require Mod A due to L LE inattention, ataxia and decreased balance.  Pt demonstrated LE exercises well.  Will continue to emphasize need for generalized strengthening of L LE.  Continue to recommend SNF.  Follow Up Recommendations  SNF     Equipment Recommendations       Recommendations for Other Services       Precautions / Restrictions Precautions Precautions:  Fall Precaution Comments: reason for admission - pt has had 2 falls in the last week Restrictions Weight Bearing Restrictions: No    Mobility  Bed Mobility Overal bed mobility: Needs Assistance Bed Mobility: Supine to Sit     Supine to sit: Min guard     General bed mobility comments: Pt requires increased time to come to the EOB.   Transfers Overall transfer level: Needs assistance   Transfers: Sit to/from Stand Sit to Stand: Min assist         General transfer comment: Pt continues to require cues for hand placement and to push up from the surface he is sitting on, not pull on the RW.  Ambulation/Gait Ambulation/Gait assistance: Mod assist Ambulation Distance (Feet): 60 Feet Assistive device: Rolling walker (2 wheeled) Gait Pattern/deviations: Step-to pattern;Ataxic;Decreased weight shift to right;Decreased dorsiflexion - left     General Gait Details: Pt requires manual assistance to shift his weight to the right in order to advance L LE.  Noted some L knee hyper extension with weight bearing today, and + trendelenburg.  Pt continues to demonstrate L foot inattention - especially if he is not looking directly at it.    Stairs            Wheelchair Mobility    Modified Rankin (Stroke Patients Only)       Balance   Sitting-balance support: Feet supported;Bilateral upper extremity supported Sitting balance-Leahy Scale: Fair     Standing balance support: Bilateral  upper extremity supported Standing balance-Leahy Scale: Fair                      Cognition Arousal/Alertness: Awake/alert Behavior During Therapy: WFL for tasks assessed/performed Overall Cognitive Status: Impaired/Different from baseline Area of Impairment: Safety/judgement         Safety/Judgement: Decreased awareness of deficits          Exercises General Exercises - Lower Extremity Ankle Circles/Pumps: Strengthening;Both;20 reps Long Arc Quad: Strengthening;Both;10  reps;Seated Other Exercises Other Exercises: sit<>stand x 5 reps with emphasis on hand placement.     General Comments        Pertinent Vitals/Pain Pain Assessment: No/denies pain    Home Living                      Prior Function            PT Goals (current goals can now be found in the care plan section) Acute Rehab PT Goals Patient Stated Goal: Pt expressed that he wants to get stronger.  PT Goal Formulation: With patient Time For Goal Achievement: 01/27/16 Potential to Achieve Goals: Fair Progress towards PT goals: Progressing toward goals    Frequency  7X/week    PT Plan Current plan remains appropriate    Co-evaluation             End of Session Equipment Utilized During Treatment: Gait belt Activity Tolerance: Patient tolerated treatment well Patient left: in chair;with call bell/phone within reach;with nursing/sitter in room     Time: 8119-1478 PT Time Calculation (min) (ACUTE ONLY): 24 min  Charges:  $Gait Training: 8-22 mins $Therapeutic Exercise: 8-22 mins                    G Codes:      Beth Zaydyn Havey, PT, DPT X: [phone]   01/15/2016, 4:12 PM

## 2016-01-15 NOTE — Consult Note (Signed)
[place] A. Merlene Laughter, MD     [email or web address]          Lucas Morgan is an 75 y.o. male.   ASSESSMENT/PLAN: 1. Multiple R sided. 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 He also has had remote infarcts on the left side were cortical again with a suspicion of cardiac embolic phenomena in. Risk factors diabetes, coronary artery disease and previous infarcts. 2.  Multiple infarcts with increased risk of vascular dementia.     RECOMMENDATION:    Continue with dual antiplatelet agents. 30 day event monitor.   The patient is 75 year old white male who presents with acute onset of gait impairment. The patient seemed to have evidence of dysarthria on examination. This male gone worse over his baseline stroke he had apparently February of this year per the patient. He also was left with right hand weakness. The patient complains of significant soreness and pain involving multiple locations including the thoracic regions and abdominal regions. Also has pain in the shoulder regions. He tells me that he typically ambulates at home without assisted devices and done well after his initial stroke. He was compliant with medications. The patient is noted to have ptosis of the left eye with significant corneal opacification apparently from a remote injury per the patient. The review systems is otherwise negative.  GENERAL: This a pleasant male in no acute distress.  HEENT: Supple. Normocephalic. Mild left ptosis with corneal opacification.   ABDOMEN: soft  EXTREMITIES: No edema   BACK: Normal.  SKIN: Normal by inspection.    MENTAL STATUS: Alert and oriented. Speech is moderately dysarthric. The patient states his age and the month appropriately. Language and cognition are generally intact. Judgment and insight normal.   CRANIAL NERVES: Pupils are equal, round and reactive to light and accommodation; extra ocular movements are full, there is no  significant nystagmus; visual fields are full; upper and lower facial muscles are normal in strength and symmetric, there is no flattening of the nasolabial folds; tongue is midline; uvula is midline; shoulder elevation is normal.  MOTOR: There is mild weakness of the right hand was slightly increased tone. Otherwise, normal tone, bulk and strength; no pronator drift.  COORDINATION: Left finger to nose is normal, right finger to nose is normal, No rest tremor; no intention tremor; no postural tremor; no bradykinesia.  REFLEXES: Deep tendon reflexes are symmetrical and normal.   SENSATION: Normal to light touch. There is no tactile or visual extinction to double simultaneous stimulation.     Blood pressure 121/64, pulse 78, temperature 98.2 F (36.8 C), temperature source Oral, resp. rate 20, height 5' 9"  (1.753 m), weight 168 lb 14 oz (76.6 kg), SpO2 96 %.  Past Medical History  Diagnosis Date  . Diabetes mellitus   . Dyslipidemia   . MI (myocardial infarction) ([place]) 11/09/2008    2.5 x 23 Xience V DES to the CFX  . AAA (abdominal aortic aneurysm) ([place])     a. Abd U/S 7/14: mild aneurysmal dilatation 3x3 cm; cholelithiasis without evid of cholecystitis => repeat 1 year  . Coronary artery disease   . Arthritis     stenosis, lumbar region  . Renal insufficiency   . Stroke ([place])   . CHF (congestive heart failure) ([place])   . Osteoporosis     Past Surgical History  Procedure Laterality Date  . Knee surgery    . Coronary stent placement    . Back surgery  2015  lumbar fusion  . Joint replacement Left   . 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 Lumbar laminectomy/decompression microdiscectomy N/A 10/18/2014    Procedure: LUMBAR LAMINECTOMY/DECOMPRESSION MICRODISCECTOMYLUMBAR THREE-FOUR ;  Surgeon: Charlie Pitter, MD;  Location: [facility];  Service: Neurosurgery;  Laterality: N/A;  . Coronary angioplasty    . Ep implantable device N/A 05/26/2015     Procedure: Loop Recorder Insertion;  Surgeon: Evans Lance, MD;  Location: [facility];  Service: Cardiovascular;  Laterality: N/A;  . Tee without cardioversion N/A 05/26/2015    Procedure: TRANSESOPHAGEAL ECHOCARDIOGRAM (TEE);  Surgeon: Thayer Headings, MD;  Location: [facility];  Service: Cardiovascular;  Laterality: N/A;  . Endarterectomy Left 06/17/2015    Procedure: Left Carotid ENDARTERECTOMY with Rueben Bash Patch;  Surgeon: Angelia Mould, MD;  Location: [facility];  Service: Vascular;  Laterality: Left;    Family History  Problem Relation Age of Onset  . Heart attack Brother     x2 brothers  . CAD Brother   . Stroke Mother     Social History:  reports that he has never smoked. He has never used smokeless tobacco. He reports that he does not drink alcohol or use illicit drugs.  Allergies:  Allergies  Allergen Reactions  . Trazodone And Nefazodone Other (See Comments)    High blood sugar    Medications: Prior to Admission medications   Medication Sig Start Date End Date Taking? Authorizing Provider  aspirin 81 MG tablet Take 1 tablet (81 mg total) by mouth daily. 06/19/15  Yes Shanker Kristeen Mans, MD  atorvastatin (LIPITOR) 80 MG tablet Take 1 tablet (80 mg total) by mouth daily. 09/17/15  Yes Arnoldo Lenis, MD  bisacodyl (DULCOLAX) 5 MG EC tablet Take 5 mg by mouth daily as needed for moderate constipation.   Yes Historical Provider, MD  carvedilol (COREG) 3.125 MG tablet Take 1 tablet (3.125 mg total) by mouth 2 (two) times daily with a meal. 06/19/15  Yes Shanker Kristeen Mans, MD  clopidogrel (PLAVIX) 75 MG tablet Take 1 tablet (75 mg total) by mouth daily. 06/19/15  Yes Shanker Kristeen Mans, MD  gabapentin (NEURONTIN) 100 MG capsule TAKE 3 CAPSULES(300 MG) BY MOUTH THREE TIMES DAILY 09/15/15  Yes Rosalin Hawking, MD  HYDROcodone-acetaminophen (NORCO) 10-325 MG tablet Take 1 tablet by mouth 4 (four) times daily as needed. 09/11/15  Yes Historical Provider, MD  hydrocortisone cream  0.5 % Apply 1 application topically 2 (two) times daily.   Yes Historical Provider, MD  insulin glargine (LANTUS) 100 UNIT/ML injection Inject 75 Units into the skin at bedtime.   Yes Historical Provider, MD  Liniments (BLUE-EMU SUPER STRENGTH) CREA Apply topically.   Yes Historical Provider, MD  lisinopril (PRINIVIL,ZESTRIL) 2.5 MG tablet Take 2.5 mg by mouth daily.   Yes Historical Provider, MD  mirtazapine (REMERON) 7.5 MG tablet Take 1 tablet (7.5 mg total) by mouth at bedtime. For depression/sleep 06/11/15  Yes Ivan Anchors Love, PA-C  pantoprazole (PROTONIX) 40 MG tablet Take 1 tablet (40 mg total) by mouth daily. 06/19/15  Yes Shanker Kristeen Mans, MD  tamsulosin (FLOMAX) 0.4 MG CAPS capsule Take 1 capsule (0.4 mg total) by mouth daily. 06/19/15  Yes Shanker Kristeen Mans, MD    Scheduled Meds: . aspirin EC  81 mg Oral Daily  . atorvastatin  80 mg Oral Daily  . carvedilol  3.125 mg Oral BID WC  . clopidogrel  75  mg Oral Daily  . gabapentin  100 mg Oral TID  . heparin  5,000 Units Subcutaneous Q8H  . insulin aspart  0-15 Units Subcutaneous TID WC  . insulin aspart  0-5 Units Subcutaneous QHS  . insulin glargine  35 Units Subcutaneous QHS  . lisinopril  2.5 mg Oral Daily  . mirtazapine  7.5 mg Oral QHS  . pantoprazole  40 mg Oral Daily  . tamsulosin  0.4 mg Oral Daily   Continuous Infusions:  PRN Meds:.acetaminophen **OR** acetaminophen, bisacodyl, oxyCODONE-acetaminophen     Results for orders placed or performed during the hospital encounter of 01/12/16 (from the past 48 hour(s))  Vitamin B12     Status: None   Collection Time: 01/13/16 10:43 AM  Result Value Ref Range   Vitamin B-12 356 180 - 914 pg/mL    Comment: (NOTE) This assay is not validated for testing neonatal or myeloproliferative syndrome specimens for Vitamin B12 levels. Performed at [facility]   TSH     Status: None   Collection Time: 01/13/16 10:43 AM  Result Value Ref Range   TSH 0.709 0.350 - 4.500 uIU/mL   Glucose, capillary     Status: Abnormal   Collection Time: 01/13/16 11:34 AM  Result Value Ref Range   Glucose-Capillary 209 (H) 65 - 99 mg/dL   Comment 1 Notify RN    Comment 2 Document in Chart   Glucose, capillary     Status: Abnormal   Collection Time: 01/13/16  4:46 PM  Result Value Ref Range   Glucose-Capillary 203 (H) 65 - 99 mg/dL  Glucose, capillary     Status: Abnormal   Collection Time: 01/13/16  8:16 PM  Result Value Ref Range   Glucose-Capillary 230 (H) 65 - 99 mg/dL   Comment 1 Notify RN    Comment 2 Document in Chart   Basic metabolic panel     Status: Abnormal   Collection Time: 01/14/16  5:49 AM  Result Value Ref Range   Sodium 138 135 - 145 mmol/L   Potassium 4.1 3.5 - 5.1 mmol/L   Chloride 106 101 - 111 mmol/L   CO2 26 22 - 32 mmol/L   Glucose, Bld 216 (H) 65 - 99 mg/dL   BUN 17 6 - 20 mg/dL   Creatinine, Ser 1.18 0.61 - 1.24 mg/dL   Calcium 8.5 (L) 8.9 - 10.3 mg/dL   GFR calc non Af Amer 59 (L) >60 mL/min   GFR calc Af Amer >60 >60 mL/min    Comment: (NOTE) The eGFR has been calculated using the CKD EPI equation. This calculation has not been validated in all clinical situations. eGFR's persistently <60 mL/min signify possible Chronic Kidney Disease.    Anion gap 6 5 - 15  Glucose, capillary     Status: Abnormal   Collection Time: 01/14/16  7:35 AM  Result Value Ref Range   Glucose-Capillary 189 (H) 65 - 99 mg/dL   Comment 1 Notify RN    Comment 2 Document in Chart   Glucose, capillary     Status: Abnormal   Collection Time: 01/14/16 11:15 AM  Result Value Ref Range   Glucose-Capillary 154 (H) 65 - 99 mg/dL   Comment 1 Notify RN    Comment 2 Document in Chart   Glucose, capillary     Status: Abnormal   Collection Time: 01/14/16  5:03 PM  Result Value Ref Range   Glucose-Capillary 195 (H) 65 - 99 mg/dL   Comment 1 Notify  RN    Comment 2 Document in Chart   Glucose, capillary     Status: Abnormal   Collection Time: 01/14/16  9:08 PM  Result  Value Ref Range   Glucose-Capillary 215 (H) 65 - 99 mg/dL   Comment 1 Notify RN    Comment 2 Document in Chart   Basic metabolic panel     Status: Abnormal   Collection Time: 01/15/16  5:56 AM  Result Value Ref Range   Sodium 137 135 - 145 mmol/L   Potassium 3.9 3.5 - 5.1 mmol/L   Chloride 104 101 - 111 mmol/L   CO2 26 22 - 32 mmol/L   Glucose, Bld 219 (H) 65 - 99 mg/dL   BUN 18 6 - 20 mg/dL   Creatinine, Ser 1.28 (H) 0.61 - 1.24 mg/dL   Calcium 8.6 (L) 8.9 - 10.3 mg/dL   GFR calc non Af Amer 53 (L) >60 mL/min   GFR calc Af Amer >60 >60 mL/min    Comment: (NOTE) The eGFR has been calculated using the CKD EPI equation. This calculation has not been validated in all clinical situations. eGFR's persistently <60 mL/min signify possible Chronic Kidney Disease.    Anion gap 7 5 - 15  CBC     Status: Abnormal   Collection Time: 01/15/16  5:56 AM  Result Value Ref Range   WBC 8.1 4.0 - 10.5 K/uL   RBC 4.28 4.22 - 5.81 MIL/uL   Hemoglobin 13.5 13.0 - 17.0 g/dL   HCT 38.9 (L) 39.0 - 52.0 %   MCV 90.9 78.0 - 100.0 fL   MCH 31.5 26.0 - 34.0 pg   MCHC 34.7 30.0 - 36.0 g/dL   RDW 12.3 11.5 - 15.5 %   Platelets 184 150 - 400 K/uL  Glucose, capillary     Status: Abnormal   Collection Time: 01/15/16  7:39 AM  Result Value Ref Range   Glucose-Capillary 189 (H) 65 - 99 mg/dL    Studies/Results:   BRAIN MRI/MRA FINDINGS: Scattered areas of acute nonhemorrhagic infarction are evident within the posterior right MCA territory. The largest lesion measures 12 mm when and within the posterior right corona radiata. An 8 mm acute white matter infarct is present within the right centrum semi of ally. There is a punctate focus of acute infarct in the posterior limb of the right internal capsule. Additional punctate foci are present in the precentral and postcentral gyrus on the right.  T2 changes are present within the areas of acute infarction.  Remote cortical infarcts and  encephalomalacia involves the posterior left frontal lobe and at the junction of the left temporal and occipital lobe. Moderate periventricular white matter changes are present bilaterally as well.  No acute hemorrhage is present. The ventricles are proportionate to the degree of atrophy.  Remote lacunar infarcts are present in the basal ganglia and cerebellum bilaterally.  The distal right vertebral artery is occluded. The left vertebral artery and basilar artery are patent. The internal carotid arteries are patent bilaterally. The left globe is shrunken with heterogeneous signal. The right globe is intact. The orbits are otherwise unremarkable.  The paranasal sinuses are clear. The mastoid air cells are clear. The skullbase is within normal limits. Midline sagittal images are unremarkable.  IMPRESSION: 1. Scattered foci of acute nonhemorrhagic infarction involving the posterior right MCA territory. These include lesions along the right precentral and postcentral gyrus. Larger acute/subacute white matter infarcts are present as well. 2. Remote cortical infarcts involving the posterior  left frontal lobe and the posterior left temporal lobe. 3. Remote lacunar infarcts of the basal ganglia and cerebellum bilaterally. 4. Advanced diffuse periventricular white matter changes compatible with chronic microvascular ischemia.  FINDINGS: The internal carotid arteries are within normal limits from the high cervical segments through the ICA termini bilaterally. The left A1 segment is dominant. The anterior communicating artery is patent. The A1 and M1 segments are normal. The MCA bifurcations are intact. Asymmetric segmental attenuation of MCA branch vessels is noted.  The left vertebral artery is the dominant vessel. The right vertebral artery terminates at the PICA. The left PICA origin is visualized and normal. Both posterior cerebral arteries originate from the basilar tip.  There is some attenuation of distal PCA branch vessels bilaterally, worse on the right.  IMPRESSION: 1. No significant proximal stenosis, aneurysm, or branch vessel occlusion. 2. Mild distal small vessel disease within the right MCA branch vessels and right greater than left PCA branch vessels     The patient's brain MRI is reviewed in person. There is multiple small to medium size acute infarcts in his shower light distribution involving the right parietal cortical region and also right frontal region. There is also a couple of deep white matter acute infarcts seen on diffusion images involving the right frontal region. There are chronic infarcts cortical base involving the left frontal and left parietal areas. The left parietal area is in the typical watershed distribution. There is scattered moderate white matter leukoencephalopathy. No microhemorrhages are appreciated.       Meesha Sek A. Merlene Laughter, M.D.  Diplomate, [employer] adviser of Psychiatry and Neurology ( Neurology). 01/15/2016, 8:14 AM

## 2016-01-15 NOTE — Progress Notes (Signed)
PROGRESS NOTE    Lucas EatonJohn C Morgan  AVW:[id number] DOB: 12/26/1940 DOA: 01/12/2016 PCP: Kirt Boysarter, Monica, DO Outpatient Specialists:  Cardiology; Herby Abrahamhomas D Stuckey, MD  Gastroenterology; [place] BaliSandi L Fields, MD  Brief Narrative:  [age] yom with a hx of CVA, L CEA last year, CMP with EF 30%, DMtype 2, HLD, and CKD presented after falling at home. CT head showed no acute process, electrolytes were unremarkable. Blood sugars were elevated. CT hip showed no Fx or hematoma. Ambulation was difficult for him so he was referred for admission.   Assessment & Plan:   Principal Problem:   Fall at home Active Problems:   CAD, NATIVE VESSEL   AAA (abdominal aortic aneurysm) (HCC)   Carotid stenosis   Right hemiparesis (HCC)   Dyslipidemia   Cardiomyopathy, ischemic   Type 2 diabetes mellitus with stage 2 chronic kidney disease, with long-term current use of insulin (HCC)   Ataxia   Orthostatic hypotension   Hip pain, acute   Acute CVA (cerebrovascular accident) (HCC)   1. Acute CVA. MRI brain showed scattered foci of acute nonhemorrhagic infarction involving the posterior right MCA territory and larger acute white matter infarcts. Carotid Dopplers did not show any significant stenosis bilaterally. Echocardiogram appears relatively unchanged. Neurology consulted and recommended continuing dual antiplatelet therapy. Also recommended 30 day event monitor. Physical therapy has seen the patient has recommended skilled nursing facility placement. Hemoglobin A1c mildly elevated at 8.6. Lipid panel in process. 2. On a combined systolic and diastolic congestive heart failure. Ejection fraction 40-45%. Appears unchanged from prior echocardiogram. Will continue beta blocker and ACE inhibitor at this time due to #7.. 3. Carotid stenosis. S/p left CEA last year, continue dual antiplatelet therapy. 4. AAA. Stable. 5. HLD. Continue Statins at max dose. 6. DM type 2. Glucose 216. Continue carb mod diet. Continue  SSI. 7. Orthostatic hypotension. Improving with IV fluids. 8. Left Hip pain. Likely related to fall. Imaging has been unrevealing. Continue physical therapy.    DVT prophylaxis: Heparin Code Status: Full Family Communication: Discussed with daughter over the phone Disposition Plan: anticipate discharge to SNF once improved.    Consultants:   PT  Neurology   Procedures:   none  Antimicrobials:   none    Subjective: Feeling better today. No dizziness on standing  Objective: Filed Vitals:   01/14/16 1451 01/14/16 1745 01/14/16 2100 01/15/16 0508  BP: 119/63  111/57 103/51  Pulse: 78 72    Temp:   98.3 F (36.8 C) 98.2 F (36.8 C)  TempSrc:   Oral Oral  Resp:      Height:      Weight:      SpO2:   96% 96%    Intake/Output Summary (Last 24 hours) at 01/15/16 0657 Last data filed at 01/15/16 0510  Gross per 24 hour  Intake    600 ml  Output   1050 ml  Net   -450 ml   Filed Weights   01/12/16 2250 01/13/16 0552  Weight: 77.111 kg (170 lb) 76.6 kg (168 lb 14 oz)    Examination:  General exam: Appears calm and comfortable  Respiratory system: Clear to auscultation. Respiratory effort normal. Cardiovascular system: S1 & S2 heard, RRR. No JVD, murmurs, rubs, gallops or clicks. No pedal edema. Gastrointestinal system: Abdomen is nondistended, soft and nontender. No organomegaly or masses felt. Normal bowel sounds heard. Central nervous system: Alert and oriented. No focal neurological deficits. Extremities: Symmetric 5 x 5 power. Skin: No rashes, lesions or ulcers  Psychiatry: Judgement and insight appear normal. Mood & affect appropriate.    Data Reviewed: I have personally reviewed following labs and imaging studies  CBC:  Recent Labs Lab 01/12/16 2327 01/13/16 0607 01/15/16 0556  WBC 10.2 8.2 8.1  NEUTROABS 6.8  --   --   HGB 13.3 13.1 13.5  HCT 38.2* 38.0* 38.9*  MCV 90.1 90.3 90.9  PLT 177 175 184   Basic Metabolic Panel:  Recent  Labs Lab 01/12/16 2327 01/13/16 0607 01/14/16 0549 01/15/16 0556  NA 138  --  138 137  K 4.6  --  4.1 3.9  CL 104  --  106 104  CO2 24  --  26 26  GLUCOSE 304*  --  216* 219*  BUN 19  --  17 18  CREATININE 1.38* 1.24 1.18 1.28*  CALCIUM 8.8*  --  8.5* 8.6*   GFR: Estimated Creatinine Clearance: 50.6 mL/min (by C-G formula based on Cr of 1.28). Liver Function Tests:  Recent Labs Lab 01/12/16 2327  AST 18  ALT 11*  ALKPHOS 68  BILITOT 0.4  PROT 6.8  ALBUMIN 3.3*   No results for input(s): LIPASE, AMYLASE in the last 168 hours. No results for input(s): AMMONIA in the last 168 hours. Coagulation Profile:  Recent Labs Lab 01/12/16 2327  INR 1.09   Cardiac Enzymes: No results for input(s): CKTOTAL, CKMB, CKMBINDEX, TROPONINI in the last 168 hours. BNP (last 3 results) No results for input(s): PROBNP in the last 8760 hours. HbA1C:  Recent Labs  01/13/16 0607  HGBA1C 8.6*   CBG:  Recent Labs Lab 01/13/16 2016 01/14/16 0735 01/14/16 1115 01/14/16 1703 01/14/16 2108  GLUCAP 230* 189* 154* 195* 215*   Lipid Profile: No results for input(s): CHOL, HDL, LDLCALC, TRIG, CHOLHDL, LDLDIRECT in the last 72 hours. Thyroid Function Tests:  Recent Labs  01/13/16 1043  TSH 0.709   Anemia Panel:  Recent Labs  01/13/16 1043  VITAMINB12 356   Urine analysis:    Component Value Date/Time   COLORURINE YELLOW 01/13/2016 0255   APPEARANCEUR CLEAR 01/13/2016 0255   LABSPEC 1.010 01/13/2016 0255   PHURINE 6.0 01/13/2016 0255   GLUCOSEU >1000* 01/13/2016 0255   HGBUR NEGATIVE 01/13/2016 0255   BILIRUBINUR NEGATIVE 01/13/2016 0255   KETONESUR NEGATIVE 01/13/2016 0255   PROTEINUR NEGATIVE 01/13/2016 0255   UROBILINOGEN 1.0 05/30/2015 1022   NITRITE NEGATIVE 01/13/2016 0255   LEUKOCYTESUR NEGATIVE 01/13/2016 0255   Sepsis Labs: @LABRCNTIP (procalcitonin:4,lacticidven:4)  )No results found for this or any previous visit (from the past 240 hour(s)).        Radiology Studies: Dg Cervical Spine 2 Or 3 Views  01/13/2016  CLINICAL DATA:  Pt was seen in ED last night due to fall. Pt was walking to the bathroom in his home when he fell against the wall. Pt c/o popping and stiffness in neck since. EXAM: CERVICAL SPINE - 2-3 VIEW COMPARISON:  None. FINDINGS: There is moderate mid cervical degenerative change, most notable at C5-6 and C6-7. There is 1 mm retrolisthesis of C3 on C4, likely degenerative. No evidence for acute fracture or subluxation. Prevertebral soft tissues are normal in appearance. IMPRESSION: Mid cervical spondylosis.  No evidence for acute  abnormality. Electronically Signed   By: Norva Pavlov M.D.   On: 01/13/2016 18:46   Mr Maxine Glenn Head Wo Contrast  01/14/2016  CLINICAL DATA:  Scattered acute right MCA territory infarcts. EXAM: MRA HEAD WITHOUT CONTRAST TECHNIQUE: Angiographic images of the Circle of Willis were obtained  using MRA technique without intravenous contrast. COMPARISON:  None. FINDINGS: The internal carotid arteries are within normal limits from the high cervical segments through the ICA termini bilaterally. The left A1 segment is dominant. The anterior communicating artery is patent. The A1 and M1 segments are normal. The MCA bifurcations are intact. Asymmetric segmental attenuation of MCA branch vessels is noted. The left vertebral artery is the dominant vessel. The right vertebral artery terminates at the PICA. The left PICA origin is visualized and normal. Both posterior cerebral arteries originate from the basilar tip. There is some attenuation of distal PCA branch vessels bilaterally, worse on the right. IMPRESSION: 1. No significant proximal stenosis, aneurysm, or branch vessel occlusion. 2. Mild distal small vessel disease within the right MCA branch vessels and right greater than left PCA branch vessels. Electronically Signed   By: Marin Roberts M.D.   On: 01/14/2016 17:54   Mr Brain Wo Contrast  01/14/2016   CLINICAL DATA:  New onset left-sided weakness.  Fall at home today. EXAM: MRI HEAD WITHOUT CONTRAST TECHNIQUE: Multiplanar, multiecho pulse sequences of the brain and surrounding structures were obtained without intravenous contrast. COMPARISON:  CT HEAD WITHOUT CONTRAST 01/13/2016. FINDINGS: Scattered areas of acute nonhemorrhagic infarction are evident within the posterior right MCA territory. The largest lesion measures 12 mm when and within the posterior right corona radiata. An 8 mm acute white matter infarct is present within the right centrum semi of ally. There is a punctate focus of acute infarct in the posterior limb of the right internal capsule. Additional punctate foci are present in the precentral and postcentral gyrus on the right. T2 changes are present within the areas of acute infarction. Remote cortical infarcts and encephalomalacia involves the posterior left frontal lobe and at the junction of the left temporal and occipital lobe. Moderate periventricular white matter changes are present bilaterally as well. No acute hemorrhage is present. The ventricles are proportionate to the degree of atrophy. Remote lacunar infarcts are present in the basal ganglia and cerebellum bilaterally. The distal right vertebral artery is occluded. The left vertebral artery and basilar artery are patent. The internal carotid arteries are patent bilaterally. The left globe is shrunken with heterogeneous signal. The right globe is intact. The orbits are otherwise unremarkable. The paranasal sinuses are clear. The mastoid air cells are clear. The skullbase is within normal limits. Midline sagittal images are unremarkable. IMPRESSION: 1. Scattered foci of acute nonhemorrhagic infarction involving the posterior right MCA territory. These include lesions along the right precentral and postcentral gyrus. Larger acute/subacute white matter infarcts are present as well. 2. Remote cortical infarcts involving the posterior left  frontal lobe and the posterior left temporal lobe. 3. Remote lacunar infarcts of the basal ganglia and cerebellum bilaterally. 4. Advanced diffuse periventricular white matter changes compatible with chronic microvascular ischemia. These results were called by telephone at the time of interpretation on 01/14/2016 at 4:38 pm to Dr. Durward Mallard [facility] , who verbally acknowledged these results. Electronically Signed   By: Marin Roberts M.D.   On: 01/14/2016 16:45        Scheduled Meds: . aspirin EC  81 mg Oral Daily  . atorvastatin  80 mg Oral Daily  . carvedilol  3.125 mg Oral BID WC  . clopidogrel  75 mg Oral Daily  . gabapentin  100 mg Oral TID  . heparin  5,000 Units Subcutaneous Q8H  . insulin aspart  0-15 Units Subcutaneous TID WC  . insulin aspart  0-5 Units Subcutaneous QHS  .  insulin glargine  35 Units Subcutaneous QHS  . lisinopril  2.5 mg Oral Daily  . mirtazapine  7.5 mg Oral QHS  . pantoprazole  40 mg Oral Daily  . tamsulosin  0.4 mg Oral Daily   Continuous Infusions:     LOS: 1 day    Time spent: 25 minutes    Erick Blinks, MD [facility] Pager [phone]  If 7PM-7AM, please contact night-coverage [email or web address] Password [facility] 01/15/2016, 6:57 AM

## 2016-01-16 ENCOUNTER — Encounter: Payer: Self-pay | Admitting: Vascular Surgery

## 2016-01-16 DIAGNOSIS — R29898 Other symptoms and signs involving the musculoskeletal system: Secondary | ICD-10-CM | POA: Insufficient documentation

## 2016-01-16 LAB — GLUCOSE, CAPILLARY
GLUCOSE-CAPILLARY: 134 mg/dL — AB (ref 65–99)
GLUCOSE-CAPILLARY: 186 mg/dL — AB (ref 65–99)

## 2016-01-16 LAB — LIPID PANEL
CHOL/HDL RATIO: 3.7 ratio
Cholesterol: 85 mg/dL (ref 0–200)
HDL: 23 mg/dL — ABNORMAL LOW (ref >40)
LDL Cholesterol: 29 mg/dL (ref 0–99)
Triglycerides: 165 mg/dL — ABNORMAL HIGH (ref <150)
VLDL: 33 mg/dL (ref 0–40)

## 2016-01-16 MED ORDER — HYDROCODONE-ACETAMINOPHEN 10-325 MG PO TABS: 1.0000 | ORAL_TABLET | Freq: Four times a day (QID) | ORAL | Status: DC | PRN

## 2016-01-16 MED ORDER — INSULIN GLARGINE 100 UNIT/ML ~~LOC~~ SOLN
40.0000 [IU] | Freq: Every day | SUBCUTANEOUS | Status: DC
Start: 2016-01-16 — End: 2016-06-25

## 2016-01-16 NOTE — Clinical Social Work Placement (Signed)
   CLINICAL SOCIAL WORK PLACEMENT  NOTE  Date:  01/16/2016  Patient Details  Name: Lucas EatonJohn C Morgan MRN: [id number] Date of Birth: 12/17/1940  Clinical Social Work is seeking post-discharge placement for this patient at the Skilled  Nursing Facility level of care (*CSW will initial, date and re-position this form in  chart as items are completed):  Yes   Patient/family provided with [facility] Clinical Social Work Department's list of facilities offering this level of care within the geographic area requested by the patient (or if unable, by the patient's family).  Yes   Patient/family informed of their freedom to choose among providers that offer the needed level of care, that participate in Medicare, Medicaid or managed care program needed by the patient, have an available bed and are willing to accept the patient.  Yes   Patient/family informed of [facility]'s ownership interest in [facility] and [facility], as well as of the fact that they are under no obligation to receive care at these facilities.  PASRR submitted to EDS on       PASRR number received on       Existing PASRR number confirmed on       FL2 transmitted to all facilities in geographic area requested by pt/family on 01/13/16     FL2 transmitted to all facilities within larger geographic area on       Patient informed that his/her managed care company has contracts with or will negotiate with certain facilities, including the following:        Yes   Patient/family informed of bed offers received.  [facility]     Physician recommends and patient chooses bed at      [facility].  Patient to be transferred to facility by family     Patient family notified on 01/16/16 of transfer.  Name of family member notified:  [name]- daughter     PHYSICIAN       Additional Comment:     _______________________________________________ Karn CassisStultz, Chessa Barrasso [place]ger, LCSW 01/16/2016, 1:06 PM [phone]

## 2016-01-16 NOTE — Care Management Important Message (Signed)
Important Message  Patient Details  Name: Lucas EatonJohn C Fate MRN: [id number] Date of Birth: 05/29/1941   Medicare Important Message Given:  Yes    [name], Emmily Pellegrin L, RN 01/16/2016, 1:37 PM

## 2016-01-16 NOTE — Progress Notes (Addendum)
Physical Therapy Treatment Patient Details Name: Lucas Morgan MRN: [id number] DOB: June 30, 1941 Today's Date: 01/16/2016    History of Present Illness Lucas Morgan is an 75 y.o. 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 He was able to see his cardiologist yesterday (Dr Wyline Mood) for follow up on his cardiac condition, but once home, his daughter was not able to help him as he couldn't ambulate. It was n't clear if he has weakness or due to pain. Evaluation in the ER showed head CT showing no acute process, electrolytes were unremarkable, and BS was in the 300's. His Cr was 1.38, and his UA, CBC, lactic acid, was negative. His CXR was negative. He had a hip CT of the hip, showing no Fx and no hematoma. Attempted to ambulate him was difficult, as he had required assistance, and thus hospitalist was asked to admit him for further evaluation.  XR of C-spine showed mild cervical spondylosis. MRI of the head 5/3 shows Scattered foci of acute nonhemorrhagic infarction involving the posterior right  CA territory. These include lesions along the right precentral and postcentral gyrus. Larger acute/subacute white matter infarcts are present as well.    PT Comments    Pt semirecumbent upon therapist entrance, pt awake and willing to participate with therapy today.  No reports of pain today.  Session focus on improving LE strengthening especially Lt LE, improving bed mobility and transfer abilities and gait training.  Pt with min assistance with bed mobility.  Pt able to Rt side independently with min assistance required to transition to seated position, verbal and tactile cueing for handplacement to assist.  Verbal cueing for proper handplacement with sit to stands, pt with tendency to reach for  walker rather than push from chair, explained to push from chair for increased assistance and safety.  Gait training complete with min assistance with manual assistance with weight shifting to Rt LE to advanced Lt LE.  Pt presents with minimal dorsiflexion with gait though able to complete partial dorsiflexion with standing therex.  End of session pt left in chair with chair alarm set and call bell within reach.  No reports of pain through session.    Follow Up Recommendations        Equipment Recommendations       Recommendations for Other Services       Precautions / Restrictions Precautions Precautions: Fall Precaution Comments: reason for admission - pt has had 2 falls in the last week Restrictions Weight Bearing Restrictions: No    Mobility  Bed Mobility Overal bed mobility: Needs Assistance Bed Mobility: Supine to Sit     Supine to sit: Min guard     General bed mobility comments: Pt able to I roll to Rt sidelying, min assistance and verbal cueing for sidelying to sit  Transfers Overall transfer level: Modified independent Equipment used: Rolling walker (2 wheeled) Transfers: Sit to/from Stand Sit to Stand: Min assist         General transfer comment: Pt continues to require cues for hand placement and to push up from the surface he is sitting on, not pull on the RW.  Ambulation/Gait Ambulation/Gait assistance: Mod assist Ambulation Distance (Feet): 60 Feet Assistive device: Rolling walker (2 wheeled) Gait Pattern/deviations: Step-through pattern;Decreased weight shift to  right;Decreased dorsiflexion - left;Trunk flexed     General Gait Details: Pt requires manual assistance to shift his weight to the right in order to advance L LE.  Noted some L knee hyper extension with weight bearing today, and + trendelenburg.  Pt continues to demonstrate L foot inattention - especially if he is not looking directly at it.    Stairs            Wheelchair Mobility     Modified Rankin (Stroke Patients Only)       Balance                                    Cognition Arousal/Alertness: Awake/alert Behavior During Therapy: WFL for tasks assessed/performed Overall Cognitive Status: Impaired/Different from baseline Area of Impairment: Safety/judgement         Safety/Judgement: Decreased awareness of deficits          Exercises General Exercises - Lower Extremity Ankle Circles/Pumps: Strengthening;Both;20 reps Long Arc Quad: Strengthening;Both;10 reps;Seated Heel Slides: Strengthening;Both;10 reps;Limitations Heel Slides Limitations: Noted ataxia of L LE with this exercise, therefore, L LE required CGA for form Hip Flexion/Marching: Strengthening;Both;10 reps;Seated;Limitations Toe Raises: Both;10 reps;Standing (decreased WB Lt LE) Heel Raises: Both;10 reps;Standing (decreased WB Lt LE)    General Comments        Pertinent Vitals/Pain Pain Assessment: No/denies pain    Home Living                      Prior Function            PT Goals (current goals can now be found in the care plan section) Acute Rehab PT Goals Patient Stated Goal: Pt expressed that he wants to get stronger.  Progress towards PT goals: Progressing toward goals    Frequency  7X/week    PT Plan Current plan remains appropriate    Co-evaluation             End of Session Equipment Utilized During Treatment: Gait belt Activity Tolerance: Patient tolerated treatment well Patient left: in chair;with call bell/phone within reach;with chair alarm set     Time: 1118-1150 PT Time Calculation (min) (ACUTE ONLY): 32 min  Charges:  $Gait Training: 8-22 mins $Therapeutic Exercise: 8-22 mins $Therapeutic Activity: 8-22 mins                    G Codes:     Becky SaxCasey Lamarco Gudiel, LPTA; CBIS [phone]  Juel BurrowCockerham, Larose Batres Jo 01/16/2016, 4:35 PM

## 2016-01-19 ENCOUNTER — Telehealth: Payer: Self-pay

## 2016-01-19 ENCOUNTER — Other Ambulatory Visit: Payer: Self-pay

## 2016-01-19 DIAGNOSIS — I6389 Other cerebral infarction: Secondary | ICD-10-CM

## 2016-01-19 NOTE — Telephone Encounter (Signed)
Put in the order for cardiac event monitor. [facility].

## 2016-01-20 ENCOUNTER — Ambulatory Visit (INDEPENDENT_AMBULATORY_CARE_PROVIDER_SITE_OTHER): Payer: Medicare Other

## 2016-01-20 DIAGNOSIS — I638 Other cerebral infarction: Secondary | ICD-10-CM | POA: Diagnosis not present

## 2016-01-20 DIAGNOSIS — I6389 Other cerebral infarction: Secondary | ICD-10-CM

## 2016-01-21 ENCOUNTER — Ambulatory Visit (HOSPITAL_COMMUNITY): Payer: Medicare Other

## 2016-01-21 ENCOUNTER — Ambulatory Visit: Payer: Medicare Other | Admitting: Vascular Surgery

## 2016-01-21 ENCOUNTER — Ambulatory Visit (INDEPENDENT_AMBULATORY_CARE_PROVIDER_SITE_OTHER): Payer: Medicare Other | Admitting: *Deleted

## 2016-01-21 DIAGNOSIS — I639 Cerebral infarction, unspecified: Secondary | ICD-10-CM | POA: Diagnosis not present

## 2016-01-21 NOTE — Progress Notes (Signed)
[employer] Summary Report / Loop Recorder 

## 2016-01-23 ENCOUNTER — Ambulatory Visit: Payer: Medicare Other | Admitting: "Endocrinology

## 2016-01-30 ENCOUNTER — Telehealth: Payer: Self-pay | Admitting: Cardiology

## 2016-01-30 NOTE — Telephone Encounter (Signed)
LMOVM requesting that pt send manual transmission b/c home monitor has not updated in at least 14 days.    

## 2016-02-04 LAB — CUP PACEART REMOTE DEVICE CHECK: MDC IDC SESS DTM: 20170311163722

## 2016-02-08 LAB — CUP PACEART REMOTE DEVICE CHECK: MDC IDC SESS DTM: 20170410170700

## 2016-02-08 NOTE — Progress Notes (Signed)
Carelink summary report received. Battery status OK. Normal device function. No new symptom episodes, brady, or pause episodes.1 AF episode, not true AF. Tachy episode oversensing. Monthly summary reports and ROV/PRN

## 2016-02-17 ENCOUNTER — Telehealth: Payer: Self-pay | Admitting: *Deleted

## 2016-02-17 NOTE — Telephone Encounter (Signed)
Maralyn SagoSarah - nurse from [facility] sent f/u from heart failure program - pt c/o back pain, cough, swelling did not provide weights, some SOB. Wanted info forwarded to provider.

## 2016-02-17 NOTE — Telephone Encounter (Signed)
Can we touch base with the patient and get more information on symptoms, degree of swelling, and his home weights   Dominga FerryJ Tamarah Bhullar MD

## 2016-02-17 NOTE — Telephone Encounter (Signed)
Spoke with niece Asher MuirJamie - says pt weight is 173lbs says swelling/pain is in the back. Denies pt is swelling in legs, feet or hands. Says SOB is only when walking and hot outside - says no significant changes in pt. Says that pt has trouble hearing and that if Maralyn SagoSarah called and spoke with the pt he could have misunderstood her. Also says pt has arthritis and thinks that this is what is causing back pain and swelling.

## 2016-02-19 NOTE — Telephone Encounter (Signed)
Ok thank for follow up and clarification. He can f/u with pcp for his back issues  Dominga FerryJ Jamey Demchak MD

## 2016-02-20 ENCOUNTER — Ambulatory Visit (INDEPENDENT_AMBULATORY_CARE_PROVIDER_SITE_OTHER): Payer: Medicare Other | Admitting: *Deleted

## 2016-02-20 DIAGNOSIS — I639 Cerebral infarction, unspecified: Secondary | ICD-10-CM | POA: Diagnosis not present

## 2016-02-23 NOTE — Progress Notes (Signed)
[employer] Summary Report / Loop Recorder 

## 2016-02-27 LAB — CUP PACEART REMOTE DEVICE CHECK
Date Time Interrogation Session: 20170510173652
MDC IDC SESS DTM: 20170609173617

## 2016-03-02 ENCOUNTER — Encounter: Payer: Self-pay | Admitting: Orthopaedic Surgery

## 2016-03-02 ENCOUNTER — Ambulatory Visit (INDEPENDENT_AMBULATORY_CARE_PROVIDER_SITE_OTHER): Payer: Medicare Other

## 2016-03-02 ENCOUNTER — Ambulatory Visit (INDEPENDENT_AMBULATORY_CARE_PROVIDER_SITE_OTHER): Payer: Medicare Other | Admitting: Orthopaedic Surgery

## 2016-03-02 ENCOUNTER — Ambulatory Visit: Payer: Medicare Other

## 2016-03-02 VITALS — BP 106/56 | HR 79 | Temp 97.5°F | Ht 67.0 in | Wt 162.6 lb

## 2016-03-02 DIAGNOSIS — S62609A Fracture of unspecified phalanx of unspecified finger, initial encounter for closed fracture: Secondary | ICD-10-CM | POA: Diagnosis not present

## 2016-03-02 DIAGNOSIS — M79644 Pain in right finger(s): Secondary | ICD-10-CM | POA: Diagnosis not present

## 2016-03-02 DIAGNOSIS — S62329A Displaced fracture of shaft of unspecified metacarpal bone, initial encounter for closed fracture: Secondary | ICD-10-CM | POA: Diagnosis not present

## 2016-03-02 NOTE — Progress Notes (Signed)
Subjective:  I hurt my right thumb    Patient ID: Lucas EatonJohn C Morgan, male    DOB: 09/16/1940, 75 y.o.   MRN: [id number]  HPI He hurt his right thumb a couple of days ago.  He fell.  He uses a walker.  He was [facility] yesterday and an appointment was made for today.  I have not seen the notes from [facility] as they are not available today.  He has pain in the right thumb base and has swelling and pain with ecchymosis of the thumb thenar area.    He has diabetes, on insulin, controlled.  Last A1C was 6.7  He has hypertension, well controlled.  He has been on a walker for some time.  He is followed by chronic pain management. He will use the pain medicine from them.   Review of Systems  HENT: Negative for congestion.   Respiratory: Negative for cough and shortness of breath.   Cardiovascular: Negative for chest pain and leg swelling.  Endocrine: Positive for cold intolerance.  Musculoskeletal: Positive for arthralgias and gait problem.  Allergic/Immunologic: Positive for environmental allergies.   Past Medical History  Diagnosis Date  . Diabetes mellitus   . Dyslipidemia   . MI (myocardial infarction) (HCC) 11/09/2008    2.5 x 23 Xience V DES to the CFX  . AAA (abdominal aortic aneurysm) (HCC)     a. Abd U/S 7/14: mild aneurysmal dilatation 3x3 cm; cholelithiasis without evid of cholecystitis => repeat 1 year  . Coronary artery disease   . Arthritis     stenosis, lumbar region  . Renal insufficiency   . Stroke (HCC)   . CHF (congestive heart failure) (HCC)   . Osteoporosis     Past Surgical History  Procedure Laterality Date  . Knee surgery    . Coronary stent placement    . Back surgery  2015    lumbar fusion  . Joint replacement Left   . 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 Lumbar laminectomy/decompression microdiscectomy N/A 10/18/2014    Procedure: LUMBAR LAMINECTOMY/DECOMPRESSION MICRODISCECTOMYLUMBAR THREE-FOUR ;   Surgeon: Temple PaciniHenry A Pool, MD;  Location: [facility]UR[facility]S;  Service: Neurosurgery;  Laterality: N/A;  . Coronary angioplasty    . Ep implantable device N/A 05/26/2015    Procedure: Loop Recorder Insertion;  Surgeon: Marinus MawGregg W Taylor, MD;  [facility];  Service: Cardiovascular;  Laterality: N/A;  . Tee without cardioversion N/A 05/26/2015    Procedure: TRANSESOPHAGEAL ECHOCARDIOGRAM (TEE);  Surgeon: Vesta MixerPhilip J Nahser, MD;  [facility];  Service: Cardiovascular;  Laterality: N/A;  . Endarterectomy Left 06/17/2015    Procedure: Left Carotid ENDARTERECTOMY with Livia SnellenXenosure Patch;  Surgeon: Chuck Hinthristopher S Dickson, MD;  [facility];  Service: Vascular;  Laterality: Left;    Current Outpatient Prescriptions on File Prior to Visit  Medication Sig Dispense Refill  . aspirin 81 MG tablet Take 1 tablet (81 mg total) by mouth daily.    Marland Kitchen. atorvastatin (LIPITOR) 80 MG tablet Take 1 tablet (80 mg total) by mouth daily. 90 tablet 3  . bisacodyl (DULCOLAX) 5 MG EC tablet Take 5 mg by mouth daily as needed for moderate constipation.    . carvedilol (COREG) 3.125 MG tablet Take 1 tablet (3.125 mg total) by mouth 2 (two) times daily with a meal. 60 tablet 0  . clopidogrel (PLAVIX) 75 MG tablet Take 1 tablet (75 mg  total) by mouth daily. 30 tablet 0  . gabapentin (NEURONTIN) 100 MG capsule TAKE 3 CAPSULES(300 MG) BY MOUTH THREE TIMES DAILY 90 capsule 3  . HYDROcodone-acetaminophen (NORCO) 10-325 MG tablet Take 1 tablet by mouth 4 (four) times daily as needed. 30 tablet 0  . hydrocortisone cream 0.5 % Apply 1 application topically 2 (two) times daily.    . insulin glargine (LANTUS) 100 UNIT/ML injection Inject 0.4 mLs (40 Units total) into the skin at bedtime. 10 mL 11  . Liniments (BLUE-EMU SUPER STRENGTH) CREA Apply topically.    Marland Kitchen lisinopril (PRINIVIL,ZESTRIL) 2.5 MG tablet Take 2.5 mg by mouth daily.    . mirtazapine (REMERON) 7.5 MG tablet Take 1 tablet (7.5 mg total) by mouth at bedtime. For  depression/sleep 30 tablet 1  . pantoprazole (PROTONIX) 40 MG tablet Take 1 tablet (40 mg total) by mouth daily. 30 tablet 0  . tamsulosin (FLOMAX) 0.4 MG CAPS capsule Take 1 capsule (0.4 mg total) by mouth daily. 30 capsule 0   No current facility-administered medications on file prior to visit.    Social History   Social History  . Marital Status: Widowed    Spouse Name: N/A  . Number of Children: N/A  . Years of Education: N/A   Occupational History  . Retired    Social History Main Topics  . Smoking status: Never Smoker   . Smokeless tobacco: Never Used  . Alcohol Use: No  . Drug Use: No  . Sexual Activity: Not on file   Other Topics Concern  . Not on file   Social History Narrative   The patient lives in [place] with the family. He denies any tobacco use.    BP 106/56 mmHg  Pulse 79  Temp(Src) 97.5 F (36.4 C)  Ht  (1.702 m)  Wt 162 lb 9.6 oz (73.755 kg)  BMI 25.46 kg/m2     Objective:   Physical Exam  Constitutional: He is oriented to person, place, and time. He appears well-developed and well-nourished.  HENT:  Head: Normocephalic and atraumatic.  Eyes: Conjunctivae and EOM are normal. Pupils are equal, round, and reactive to light.  Neck: Normal range of motion. Neck supple.  Cardiovascular: Normal rate, regular rhythm and intact distal pulses.   Pulmonary/Chest: Effort normal.  Abdominal: Soft.  Musculoskeletal: He exhibits tenderness (Pain right thumb with ecchymosis and swelling of the thenar eminence area, decreased motion, NV intact.  Left hand negative.).  Neurological: He is alert and oriented to person, place, and time. He has normal reflexes. No cranial nerve deficit. He exhibits normal muscle tone. Coordination normal.  Skin: Skin is warm and dry.  Psychiatric: He has a normal mood and affect. His behavior is normal. Judgment and thought content normal.    X-rays were done of the right thumb, see separate report.  Post  reduction x-rays were done, see separate report.      Assessment & Plan:   Encounter Diagnoses  Name Primary?  . Fracture, metacarpal shaft, closed, initial encounter   . Thumb pain, right Yes  . Closed fracture of phalanx or phalanges of hand, initial encounter    A closed reduction was done of the first metacarpal after sterile prep, 1% Xylocaine block tolerated well.  Post reduction x-rays were done.  He is to keep the splint intact.  He is to return in one week for x-rays in the cast.  Call if any problem.  Precautions discussed.  Electronically Signed Darreld Mclean, MD 6/20/20171:18  PM

## 2016-03-04 ENCOUNTER — Emergency Department (HOSPITAL_COMMUNITY): Payer: Medicare Other

## 2016-03-04 ENCOUNTER — Emergency Department (HOSPITAL_COMMUNITY)
Admission: EM | Admit: 2016-03-04 | Discharge: 2016-03-05 | Disposition: A | Discharge status: Home / Self Care | Payer: Medicare Other | Attending: Emergency Medicine | Admitting: Emergency Medicine

## 2016-03-04 ENCOUNTER — Encounter (HOSPITAL_COMMUNITY): Payer: Self-pay | Admitting: *Deleted

## 2016-03-04 DIAGNOSIS — Z7982 Long term (current) use of aspirin: Secondary | ICD-10-CM | POA: Insufficient documentation

## 2016-03-04 DIAGNOSIS — I509 Heart failure, unspecified: Secondary | ICD-10-CM | POA: Insufficient documentation

## 2016-03-04 DIAGNOSIS — Z79899 Other long term (current) drug therapy: Secondary | ICD-10-CM | POA: Diagnosis not present

## 2016-03-04 DIAGNOSIS — I252 Old myocardial infarction: Secondary | ICD-10-CM | POA: Insufficient documentation

## 2016-03-04 DIAGNOSIS — Z8673 Personal history of transient ischemic attack (TIA), and cerebral infarction without residual deficits: Secondary | ICD-10-CM | POA: Diagnosis not present

## 2016-03-04 DIAGNOSIS — Z7984 Long term (current) use of oral hypoglycemic drugs: Secondary | ICD-10-CM | POA: Diagnosis not present

## 2016-03-04 DIAGNOSIS — E119 Type 2 diabetes mellitus without complications: Secondary | ICD-10-CM | POA: Diagnosis not present

## 2016-03-04 DIAGNOSIS — R1084 Generalized abdominal pain: Secondary | ICD-10-CM | POA: Insufficient documentation

## 2016-03-04 DIAGNOSIS — E785 Hyperlipidemia, unspecified: Secondary | ICD-10-CM | POA: Diagnosis not present

## 2016-03-04 DIAGNOSIS — Z794 Long term (current) use of insulin: Secondary | ICD-10-CM | POA: Diagnosis not present

## 2016-03-04 DIAGNOSIS — R1110 Vomiting, unspecified: Secondary | ICD-10-CM | POA: Diagnosis not present

## 2016-03-04 LAB — CBC WITH DIFFERENTIAL/PLATELET
Basophils Absolute: 0 10*3/uL (ref 0.0–0.1)
Basophils Relative: 0 %
Eosinophils Absolute: 0.2 10*3/uL (ref 0.0–0.7)
Eosinophils Relative: 2 %
HEMATOCRIT: 45.4 % (ref 39.0–52.0)
HEMOGLOBIN: 16.3 g/dL (ref 13.0–17.0)
LYMPHS ABS: 2.3 10*3/uL (ref 0.7–4.0)
LYMPHS PCT: 15 %
MCH: 32 pg (ref 26.0–34.0)
MCHC: 35.9 g/dL (ref 30.0–36.0)
MCV: 89.2 fL (ref 78.0–100.0)
MONO ABS: 1.3 10*3/uL — AB (ref 0.1–1.0)
MONOS PCT: 9 %
NEUTROS ABS: 11.3 10*3/uL — AB (ref 1.7–7.7)
Neutrophils Relative %: 74 %
Platelets: 216 10*3/uL (ref 150–400)
RBC: 5.09 MIL/uL (ref 4.22–5.81)
RDW: 12 % (ref 11.5–15.5)
WBC: 15.2 10*3/uL — ABNORMAL HIGH (ref 4.0–10.5)

## 2016-03-04 LAB — CBG MONITORING, ED: Glucose-Capillary: 248 mg/dL — ABNORMAL HIGH (ref 65–99)

## 2016-03-04 LAB — COMPREHENSIVE METABOLIC PANEL
ALBUMIN: 3.7 g/dL (ref 3.5–5.0)
ALT: 16 U/L — ABNORMAL LOW (ref 17–63)
AST: 17 U/L (ref 15–41)
Alkaline Phosphatase: 83 U/L (ref 38–126)
Anion gap: 8 (ref 5–15)
BILIRUBIN TOTAL: 1.1 mg/dL (ref 0.3–1.2)
BUN: 25 mg/dL — AB (ref 6–20)
CO2: 24 mmol/L (ref 22–32)
Calcium: 9 mg/dL (ref 8.9–10.3)
Chloride: 98 mmol/L — ABNORMAL LOW (ref 101–111)
Creatinine, Ser: 1.36 mg/dL — ABNORMAL HIGH (ref 0.61–1.24)
GFR calc Af Amer: 58 mL/min — ABNORMAL LOW (ref >60)
GFR calc non Af Amer: 50 mL/min — ABNORMAL LOW (ref >60)
GLUCOSE: 317 mg/dL — AB (ref 65–99)
POTASSIUM: 4.7 mmol/L (ref 3.5–5.1)
SODIUM: 130 mmol/L — AB (ref 135–145)
TOTAL PROTEIN: 7.6 g/dL (ref 6.5–8.1)

## 2016-03-04 LAB — LIPASE, BLOOD: Lipase: 18 U/L (ref 11–51)

## 2016-03-04 MED ORDER — ONDANSETRON HCL 4 MG/2ML IJ SOLN
4.0000 mg | Freq: Once | INTRAMUSCULAR | Status: AC
  Administered 2016-03-04: 4 mg via INTRAVENOUS
  Filled 2016-03-04 (×2): qty 2

## 2016-03-04 MED ORDER — IOPAMIDOL (ISOVUE-300) INJECTION 61%
100.0000 mL | Freq: Once | INTRAVENOUS | Status: AC | PRN
  Administered 2016-03-05: 100 mL via INTRAVENOUS

## 2016-03-04 MED ORDER — SODIUM CHLORIDE 0.9 % IV SOLN
INTRAVENOUS | Status: DC
  Administered 2016-03-04: 23:00:00 via INTRAVENOUS

## 2016-03-04 MED ORDER — MORPHINE SULFATE (PF) 4 MG/ML IV SOLN
4.0000 mg | Freq: Once | INTRAVENOUS | Status: AC
  Administered 2016-03-04: 4 mg via INTRAVENOUS
  Filled 2016-03-04 (×2): qty 1

## 2016-03-04 MED ORDER — DIATRIZOATE MEGLUMINE & SODIUM 66-10 % PO SOLN
ORAL | Status: AC
  Administered 2016-03-04: 30 mL
  Filled 2016-03-04: qty 30

## 2016-03-04 MED ORDER — SODIUM CHLORIDE 0.9 % IV BOLUS (SEPSIS)
1000.0000 mL | Freq: Once | INTRAVENOUS | Status: AC
  Administered 2016-03-04: 1000 mL via INTRAVENOUS

## 2016-03-04 NOTE — ED Provider Notes (Signed)
CSN: [id number]     Arrival date & time 03/04/16  1916 History  By signing my name below, I, Vista Minkobert Ross, attest that this documentation has been prepared under the direction and in the presence of Linwood DibblesJon Darely Becknell, MD. Electronically signed, Vista Minkobert Ross, ED Scribe. 03/04/2016. 8:27 PM.   Chief Complaint  Patient presents with  . Abdominal Pain   The history is provided by the patient and a relative. No language interpreter was used.  HPI Comments: Lucas Morgan is a 75 y.o. male with a PMHx of DM, CHF, MI, AAA, who presents to the Emergency Department complaining of constant generalized abdominal pain that radiates into his back. Pt states he has not had a bowel movement in 3 days and feels bloated. Pt also reports nausea and vomiting with repeated dry heaving. Pt's relative states she gave him an enema, and 2 Duralax with no relief. Pt states that he takes hydrocodone for chronic back pain. Pt denies any chest pain or shortness of breath. Pt further denies any Hx of hernia.  Past Medical History  Diagnosis Date  . Diabetes mellitus   . Dyslipidemia   . MI (myocardial infarction) (HCC) 11/09/2008    2.5 x 23 Xience V DES to the CFX  . AAA (abdominal aortic aneurysm) (HCC)     a. Abd U/S 7/14: mild aneurysmal dilatation 3x3 cm; cholelithiasis without evid of cholecystitis => repeat 1 year  . Coronary artery disease   . Arthritis     stenosis, lumbar region  . Renal insufficiency   . Stroke (HCC)   . CHF (congestive heart failure) (HCC)   . Osteoporosis    Past Surgical History  Procedure Laterality Date  . Knee surgery    . Coronary stent placement    . Back surgery  2015    lumbar fusion  . Joint replacement Left   . 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 Lumbar laminectomy/decompression microdiscectomy N/A 10/18/2014    Procedure: LUMBAR LAMINECTOMY/DECOMPRESSION MICRODISCECTOMYLUMBAR THREE-FOUR ;  Surgeon: Temple PaciniHenry A Pool, MD;  Location: [facility];   Service: Neurosurgery;  Laterality: N/A;  . Coronary angioplasty    . Ep implantable device N/A 05/26/2015    Procedure: Loop Recorder Insertion;  Surgeon: Marinus MawGregg W Taylor, MD;  [facility];  Service: Cardiovascular;  Laterality: N/A;  . Tee without cardioversion N/A 05/26/2015    Procedure: TRANSESOPHAGEAL ECHOCARDIOGRAM (TEE);  Surgeon: Vesta MixerPhilip J Nahser, MD;  [facility];  Service: Cardiovascular;  Laterality: N/A;  . Endarterectomy Left 06/17/2015    Procedure: Left Carotid ENDARTERECTOMY with Livia SnellenXenosure Patch;  Surgeon: Chuck Hinthristopher S Dickson, MD;  [facility];  Service: Vascular;  Laterality: Left;   Family History  Problem Relation Age of Onset  . Heart attack Brother     x2 brothers  . CAD Brother   . Stroke Mother    Social History  Substance Use Topics  . Smoking status: Never Smoker   . Smokeless tobacco: Never Used  . Alcohol Use: No    Review of Systems  Respiratory: Negative for shortness of breath.   Cardiovascular: Negative for chest pain.  Gastrointestinal: Positive for vomiting and abdominal pain.  All other systems reviewed and are negative.  Allergies  Trazodone and nefazodone  Home Medications   Prior to Admission medications   Medication Sig Start Date End Date Taking? Authorizing Provider  aspirin 81 MG tablet Take 1 tablet (81  mg total) by mouth daily. 06/19/15  Yes Shanker Levora Dredge, MD  atorvastatin (LIPITOR) 80 MG tablet Take 1 tablet (80 mg total) by mouth daily. 09/17/15  Yes Antoine Poche, MD  carvedilol (COREG) 3.125 MG tablet Take 1 tablet (3.125 mg total) by mouth 2 (two) times daily with a meal. 06/19/15  Yes Shanker Levora Dredge, MD  clopidogrel (PLAVIX) 75 MG tablet Take 1 tablet (75 mg total) by mouth daily. 06/19/15  Yes Shanker Levora Dredge, MD  cyclobenzaprine (FLEXERIL) 5 MG tablet Take 5 mg by mouth daily as needed for muscle spasms.   Yes Historical Provider, MD  escitalopram (LEXAPRO) 5 MG tablet Take 5 mg by mouth  daily.   Yes Historical Provider, MD  gabapentin (NEURONTIN) 100 MG capsule TAKE 3 CAPSULES(300 MG) BY MOUTH THREE TIMES DAILY 09/15/15  Yes Marvel Plan, MD  hydrocortisone cream 0.5 % Apply 1 application topically 2 (two) times daily.   Yes Historical Provider, MD  LANTUS SOLOSTAR 100 UNIT/ML Solostar Pen Inject 20 Units into the skin at bedtime. 02/23/16  Yes Historical Provider, MD  lisinopril (PRINIVIL,ZESTRIL) 5 MG tablet Take 5 mg by mouth daily.   Yes Historical Provider, MD  metFORMIN (GLUCOPHAGE) 500 MG tablet Take 250 mg by mouth 2 (two) times daily with a meal.   Yes Historical Provider, MD  mirtazapine (REMERON) 7.5 MG tablet Take 1 tablet (7.5 mg total) by mouth at bedtime. For depression/sleep 06/11/15  Yes Evlyn Kanner Love, PA-C  pantoprazole (PROTONIX) 40 MG tablet Take 1 tablet (40 mg total) by mouth daily. 06/19/15  Yes Shanker Levora Dredge, MD  tamsulosin (FLOMAX) 0.4 MG CAPS capsule Take 1 capsule (0.4 mg total) by mouth daily. 06/19/15  Yes Shanker Levora Dredge, MD  bisacodyl (DULCOLAX) 5 MG EC tablet Take 5 mg by mouth daily as needed for moderate constipation.    Historical Provider, MD  HYDROcodone-acetaminophen (NORCO) 10-325 MG tablet Take 1 tablet by mouth 4 (four) times daily as needed. Patient taking differently: Take 1 tablet by mouth 4 (four) times daily as needed for moderate pain.  01/16/16   Erick Blinks, MD  insulin glargine (LANTUS) 100 UNIT/ML injection Inject 0.4 mLs (40 Units total) into the skin at bedtime. Patient not taking: Reported on 03/04/2016 01/16/16   Erick Blinks, MD  Liniments (BLUE-EMU SUPER STRENGTH) CREA Apply topically.    Historical Provider, MD   BP 142/78 mmHg  Pulse 85  Temp(Src) 97.4 F (36.3 C) (Temporal)  Resp 20  Wt 75.751 kg  SpO2 97% Physical Exam  Constitutional: No distress.  Elderly   HENT:  Head: Normocephalic and atraumatic.  Right Ear: External ear normal.  Left Ear: External ear normal.  Eyes: Conjunctivae are normal. Right eye  exhibits no discharge. Left eye exhibits no discharge. No scleral icterus.  Neck: Neck supple. No tracheal deviation present.  Cardiovascular: Normal rate, regular rhythm and intact distal pulses.   Pulmonary/Chest: Effort normal and breath sounds normal. No stridor. No respiratory distress. He has no wheezes. He has no rales.  Abdominal: Soft. He exhibits distension. Bowel sounds are increased. There is generalized tenderness. There is no rebound and no guarding. No hernia.  Musculoskeletal: He exhibits no edema or tenderness.  Neurological: He is alert. He has normal strength. No cranial nerve deficit (no facial droop, extraocular movements intact, no slurred speech) or sensory deficit. He exhibits normal muscle tone. He displays no seizure activity. Coordination normal.  Skin: Skin is warm and dry. No rash noted.  Psychiatric:  He has a normal mood and affect.  Nursing note and vitals reviewed.   ED Course  Procedures  DIAGNOSTIC STUDIES: Oxygen Saturation is 98% on RA, normal by my interpretation.  COORDINATION OF CARE: 8:11 PM-Will order blood work and imaging. Discussed treatment plan with pt at bedside and pt agreed to plan.   Medications given in the ED Medications  sodium chloride 0.9 % bolus 1,000 mL (0 mLs Intravenous Stopped 03/04/16 2142)    And  0.9 %  sodium chloride infusion ( Intravenous New Bag/Given 03/04/16 2231)  iopamidol (ISOVUE-300) 61 % injection 100 mL (not administered)  ondansetron (ZOFRAN) injection 4 mg (4 mg Intravenous Given 03/04/16 2234)  morphine 4 MG/ML injection 4 mg (4 mg Intravenous Given 03/04/16 2235)  diatrizoate meglumine-sodium (GASTROGRAFIN) 66-10 % solution (30 mLs  Given 03/04/16 2221)     Labs Review Labs Reviewed  COMPREHENSIVE METABOLIC PANEL - Abnormal; Notable for the following:    Sodium 130 (*)    Chloride 98 (*)    Glucose, Bld 317 (*)    BUN 25 (*)    Creatinine, Ser 1.36 (*)    ALT 16 (*)    GFR calc non Af Amer 50 (*)     GFR calc Af Amer 58 (*)    All other components within normal limits  CBC WITH DIFFERENTIAL/PLATELET - Abnormal; Notable for the following:    WBC 15.2 (*)    Neutro Abs 11.3 (*)    Monocytes Absolute 1.3 (*)    All other components within normal limits  CBG MONITORING, ED - Abnormal; Notable for the following:    Glucose-Capillary 248 (*)    All other components within normal limits  LIPASE, BLOOD  URINALYSIS, ROUTINE W REFLEX MICROSCOPIC (NOT AT [facility])    Imaging Review Dg Abd Acute W/chest  03/04/2016  CLINICAL DATA:  75 year old male with abdominal pain and vomiting EXAM: DG ABDOMEN ACUTE W/ 1V CHEST COMPARISON:  Lumbar spine radiograph dated 02/17/2016 FINDINGS: There is emphysematous changes of the lungs with chronic interstitial coarsening. No focal consolidation, pleural effusion, or pneumothorax. Cardiac silhouette is within normal limits. Cardiac monitor device noted. There is no bowel dilatation or evidence of obstruction. Multiple air-fluid levels noted throughout the small bowel. Clinical correlation is recommended to evaluate for enteritis. Moderate stool throughout the colon. There is no free air. A 4 mm radiopaque focus in the right hemi abdomen laterally may represent a renal calculus or stone within the gallbladder. There is osteopenia with multilevel degenerative changes of the spine. L1 and L4 vertebroplasty changes similar to prior study. No acute fracture. IMPRESSION: No evidence of bowel obstruction. Clinical correlation is recommended to evaluate for enteritis. No acute cardiopulmonary process.  Emphysema. Electronically Signed   By: Elgie CollardArash  Radparvar M.D.   On: 03/04/2016 21:54   I have personally reviewed and evaluated these images and lab results as part of my medical decision-making.   MDM    While in the ED the patient had a bowel movement and is feeling better.  With his elevated WBC and age a ct scan of the abdomen was ordered.  That is currently pending.  I will  turn over the case to Dr Blinda LeatherwoodPollina.  If the CT scan is negative the patient should be able to go home with outpatient follow up.    Linwood DibblesJon Hollace Michelli, MD 03/04/16 [phone]

## 2016-03-04 NOTE — ED Notes (Signed)
Family states pt has not had a BM 3-4 days. Pt has been on hydrocodone for a hairline fracture in his thumb. Pt c/o abdominal pain. Pt has taken 2 Dulcolax tablets last night, 2 suppositories today as well as, a fleet enema, mag citrate and prune juice.

## 2016-03-05 ENCOUNTER — Emergency Department (HOSPITAL_COMMUNITY): Payer: Medicare Other

## 2016-03-05 LAB — URINALYSIS, ROUTINE W REFLEX MICROSCOPIC
Bilirubin Urine: NEGATIVE
GLUCOSE, UA: 500 mg/dL — AB
Hgb urine dipstick: NEGATIVE
Ketones, ur: NEGATIVE mg/dL
LEUKOCYTES UA: NEGATIVE
Nitrite: NEGATIVE
PH: 6 (ref 5.0–8.0)
PROTEIN: NEGATIVE mg/dL
Specific Gravity, Urine: <1.005 — ABNORMAL LOW (ref 1.005–1.030)

## 2016-03-05 MED ORDER — CIPROFLOXACIN HCL 500 MG PO TABS: 500.0000 mg | ORAL_TABLET | Freq: Two times a day (BID) | ORAL | Status: DC

## 2016-03-05 MED ORDER — METRONIDAZOLE 500 MG PO TABS: 500.0000 mg | ORAL_TABLET | Freq: Three times a day (TID) | ORAL | Status: DC

## 2016-03-05 MED ORDER — IOPAMIDOL (ISOVUE-300) INJECTION 61%: 100.0000 mL | Freq: Once | INTRAVENOUS | Status: DC | PRN

## 2016-03-05 MED ORDER — METRONIDAZOLE 500 MG PO TABS
500.0000 mg | ORAL_TABLET | Freq: Once | ORAL | Status: AC
  Administered 2016-03-05: 500 mg via ORAL
  Filled 2016-03-05: qty 1

## 2016-03-05 MED ORDER — CIPROFLOXACIN HCL 250 MG PO TABS
500.0000 mg | ORAL_TABLET | Freq: Once | ORAL | Status: AC
  Administered 2016-03-05: 500 mg via ORAL
  Filled 2016-03-05: qty 2

## 2016-03-05 NOTE — Discharge Instructions (Signed)

## 2016-03-05 NOTE — ED Provider Notes (Signed)
Patient signed out to me to follow-up on CT scan and urinalysis. Patient had been experiencing generalized abdominal pain. He was feeling bloated and reported that he did not have a bowel movement for 3 days. He did have a bowel movement here in the ER and his abdominal pain improved. Urinalysis is normal. CT scan shows possible colitis, although it is favored that the findings are simply underdistended colon. As he did have pain earlier, will treat as possible infectious colitis. She was Cipro and Flagyl, follow-up PCP in the office. Return if his pain worsens.  Gilda Creasehristopher J Pollina, MD 03/05/16 [phone]

## 2016-03-08 ENCOUNTER — Emergency Department (HOSPITAL_COMMUNITY)
Admission: EM | Admit: 2016-03-08 | Discharge: 2016-03-08 | Disposition: A | Discharge status: Home / Self Care | Payer: Medicare Other | Attending: Emergency Medicine | Admitting: Emergency Medicine

## 2016-03-08 ENCOUNTER — Emergency Department (HOSPITAL_COMMUNITY): Payer: Medicare Other

## 2016-03-08 ENCOUNTER — Encounter (HOSPITAL_COMMUNITY): Payer: Self-pay | Admitting: Emergency Medicine

## 2016-03-08 DIAGNOSIS — E1165 Type 2 diabetes mellitus with hyperglycemia: Secondary | ICD-10-CM | POA: Insufficient documentation

## 2016-03-08 DIAGNOSIS — Z8673 Personal history of transient ischemic attack (TIA), and cerebral infarction without residual deficits: Secondary | ICD-10-CM | POA: Diagnosis not present

## 2016-03-08 DIAGNOSIS — Z8679 Personal history of other diseases of the circulatory system: Secondary | ICD-10-CM | POA: Diagnosis not present

## 2016-03-08 DIAGNOSIS — Z7984 Long term (current) use of oral hypoglycemic drugs: Secondary | ICD-10-CM | POA: Insufficient documentation

## 2016-03-08 DIAGNOSIS — Z7982 Long term (current) use of aspirin: Secondary | ICD-10-CM | POA: Diagnosis not present

## 2016-03-08 DIAGNOSIS — I252 Old myocardial infarction: Secondary | ICD-10-CM | POA: Insufficient documentation

## 2016-03-08 DIAGNOSIS — R1032 Left lower quadrant pain: Secondary | ICD-10-CM

## 2016-03-08 DIAGNOSIS — M1990 Unspecified osteoarthritis, unspecified site: Secondary | ICD-10-CM | POA: Diagnosis not present

## 2016-03-08 DIAGNOSIS — I2510 Atherosclerotic heart disease of native coronary artery without angina pectoris: Secondary | ICD-10-CM | POA: Insufficient documentation

## 2016-03-08 DIAGNOSIS — M810 Age-related osteoporosis without current pathological fracture: Secondary | ICD-10-CM | POA: Diagnosis not present

## 2016-03-08 DIAGNOSIS — Z79899 Other long term (current) drug therapy: Secondary | ICD-10-CM | POA: Insufficient documentation

## 2016-03-08 DIAGNOSIS — I509 Heart failure, unspecified: Secondary | ICD-10-CM | POA: Diagnosis not present

## 2016-03-08 DIAGNOSIS — Z955 Presence of coronary angioplasty implant and graft: Secondary | ICD-10-CM | POA: Diagnosis not present

## 2016-03-08 DIAGNOSIS — R739 Hyperglycemia, unspecified: Secondary | ICD-10-CM

## 2016-03-08 LAB — CBC WITH DIFFERENTIAL/PLATELET
BASOS PCT: 1 %
Basophils Absolute: 0.1 10*3/uL (ref 0.0–0.1)
Eosinophils Absolute: 0.3 10*3/uL (ref 0.0–0.7)
Eosinophils Relative: 4 %
HEMATOCRIT: 45.9 % (ref 39.0–52.0)
Hemoglobin: 15.9 g/dL (ref 13.0–17.0)
LYMPHS ABS: 1.6 10*3/uL (ref 0.7–4.0)
Lymphocytes Relative: 18 %
MCH: 31.2 pg (ref 26.0–34.0)
MCHC: 34.6 g/dL (ref 30.0–36.0)
MCV: 90 fL (ref 78.0–100.0)
MONO ABS: 0.8 10*3/uL (ref 0.1–1.0)
MONOS PCT: 9 %
NEUTROS ABS: 6.3 10*3/uL (ref 1.7–7.7)
Neutrophils Relative %: 68 %
Platelets: 233 10*3/uL (ref 150–400)
RBC: 5.1 MIL/uL (ref 4.22–5.81)
RDW: 12.2 % (ref 11.5–15.5)
WBC: 9.1 10*3/uL (ref 4.0–10.5)

## 2016-03-08 LAB — URINE MICROSCOPIC-ADD ON: RBC / HPF: NONE SEEN RBC/hpf (ref 0–5)

## 2016-03-08 LAB — URINALYSIS, ROUTINE W REFLEX MICROSCOPIC
BILIRUBIN URINE: NEGATIVE
HGB URINE DIPSTICK: NEGATIVE
KETONES UR: NEGATIVE mg/dL
Leukocytes, UA: NEGATIVE
Nitrite: NEGATIVE
PH: 5.5 (ref 5.0–8.0)
Protein, ur: NEGATIVE mg/dL
SPECIFIC GRAVITY, URINE: 1.02 (ref 1.005–1.030)

## 2016-03-08 LAB — BASIC METABOLIC PANEL
Anion gap: 7 (ref 5–15)
BUN: 23 mg/dL — ABNORMAL HIGH (ref 6–20)
CO2: 27 mmol/L (ref 22–32)
Calcium: 9 mg/dL (ref 8.9–10.3)
Chloride: 99 mmol/L — ABNORMAL LOW (ref 101–111)
Creatinine, Ser: 1.44 mg/dL — ABNORMAL HIGH (ref 0.61–1.24)
GFR calc Af Amer: 54 mL/min — ABNORMAL LOW (ref >60)
GFR calc non Af Amer: 46 mL/min — ABNORMAL LOW (ref >60)
Glucose, Bld: 350 mg/dL — ABNORMAL HIGH (ref 65–99)
Potassium: 4.8 mmol/L (ref 3.5–5.1)
Sodium: 133 mmol/L — ABNORMAL LOW (ref 135–145)

## 2016-03-08 LAB — CBG MONITORING, ED: Glucose-Capillary: 175 mg/dL — ABNORMAL HIGH (ref 65–99)

## 2016-03-08 LAB — HEPATIC FUNCTION PANEL
ALBUMIN: 3.7 g/dL (ref 3.5–5.0)
ALK PHOS: 78 U/L (ref 38–126)
ALT: 24 U/L (ref 17–63)
AST: 27 U/L (ref 15–41)
BILIRUBIN TOTAL: 0.7 mg/dL (ref 0.3–1.2)
Bilirubin, Direct: 0.1 mg/dL (ref 0.1–0.5)
Indirect Bilirubin: 0.6 mg/dL (ref 0.3–0.9)
TOTAL PROTEIN: 7.2 g/dL (ref 6.5–8.1)

## 2016-03-08 LAB — LIPASE, BLOOD: Lipase: 17 U/L (ref 11–51)

## 2016-03-08 MED ORDER — HYDROMORPHONE HCL 1 MG/ML IJ SOLN
1.0000 mg | Freq: Once | INTRAMUSCULAR | Status: AC
  Administered 2016-03-08: 1 mg via INTRAVENOUS
  Filled 2016-03-08: qty 1

## 2016-03-08 MED ORDER — ONDANSETRON HCL 4 MG/2ML IJ SOLN
4.0000 mg | Freq: Once | INTRAMUSCULAR | Status: AC
  Administered 2016-03-08: 4 mg via INTRAVENOUS
  Filled 2016-03-08: qty 2

## 2016-03-08 MED ORDER — SODIUM CHLORIDE 0.9 % IV SOLN: INTRAVENOUS | Status: DC

## 2016-03-08 MED ORDER — SODIUM CHLORIDE 0.9 % IV BOLUS (SEPSIS)
250.0000 mL | Freq: Once | INTRAVENOUS | Status: AC
  Administered 2016-03-08: 250 mL via INTRAVENOUS

## 2016-03-08 MED ORDER — INSULIN ASPART 100 UNIT/ML ~~LOC~~ SOLN
10.0000 [IU] | Freq: Once | SUBCUTANEOUS | Status: AC
Start: 2016-03-08 — End: 2016-03-08
  Administered 2016-03-08: 10 [IU] via INTRAVENOUS
  Filled 2016-03-08: qty 1

## 2016-03-08 NOTE — Discharge Instructions (Signed)
On today's workup without any significant changes in labs.  Acute abdominal series without any evidence of free air or constipation. CT scan from the 22nd without any acute findings. Would recommend follow-up with GI medicine for consideration for colonoscopy. Continue your current pain medicines.

## 2016-03-08 NOTE — ED Notes (Signed)
Daughter states she gave pt 5 enemas in last 3 days

## 2016-03-08 NOTE — ED Provider Notes (Signed)
CSN: [id number]     Arrival date & time 03/08/16  1204 History   First MD Initiated Contact with Patient 03/08/16 1521     Chief Complaint  Patient presents with  . Abdominal Pain     (Consider location/radiation/quality/duration/timing/severity/associated sxs/prior Treatment) Patient is a 75 y.o. male presenting with abdominal pain. The history is provided by the patient and a relative.  Abdominal Pain Associated symptoms: fatigue   Associated symptoms: no chest pain, no dysuria, no fever and no shortness of breath   [facility]. Patient seen June 22 for left lower quadrant and left flank abdominal pain with eccentric workup to include CT of the abdomen.. CT scan at that time raise some concern may be for colitis. Patient was treated with Cipro and Flagyl. Patient returns today saying no improvement with the antibiotic regimen. Still having pain. Patient not having any diarrhea no nausea no vomiting. Patient does have a history of chronic back pain. Pain seems to be left lower quadrant radiating around to the left back. Possible it could be back pain. According to family patient not eating very well but is drinking.  Past Medical History  Diagnosis Date  . Diabetes mellitus   . Dyslipidemia   . MI (myocardial infarction) (HCC) 11/09/2008    2.5 x 23 Xience V DES to the CFX  . AAA (abdominal aortic aneurysm) (HCC)     a. Abd U/S 7/14: mild aneurysmal dilatation 3x3 cm; cholelithiasis without evid of cholecystitis => repeat 1 year  . Coronary artery disease   . Arthritis     stenosis, lumbar region  . Renal insufficiency   . Stroke (HCC)   . CHF (congestive heart failure) (HCC)   . Osteoporosis    Past Surgical History  Procedure Laterality Date  . Knee surgery    . Coronary stent placement    . Back surgery  2015    lumbar fusion  . Joint replacement Left   . 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 Lumbar  laminectomy/decompression microdiscectomy N/A 10/18/2014    Procedure: LUMBAR LAMINECTOMY/DECOMPRESSION MICRODISCECTOMYLUMBAR THREE-FOUR ;  Surgeon: Temple PaciniHenry A Pool, MD;  Location: [facility] NEURO ORS;  Service: Neurosurgery;  Laterality: N/A;  . Coronary angioplasty    . Ep implantable device N/A 05/26/2015    Procedure: Loop Recorder Insertion;  Surgeon: Marinus MawGregg W Taylor, MD;  [facility];  Service: Cardiovascular;  Laterality: N/A;  . Tee without cardioversion N/A 05/26/2015    Procedure: TRANSESOPHAGEAL ECHOCARDIOGRAM (TEE);  Surgeon: Vesta MixerPhilip J Nahser, MD;  [facility];  Service: Cardiovascular;  Laterality: N/A;  . Endarterectomy Left 06/17/2015    Procedure: Left Carotid ENDARTERECTOMY with Livia SnellenXenosure Patch;  Surgeon: Chuck Hinthristopher S Dickson, MD;  [facility];  Service: Vascular;  Laterality: Left;   Family History  Problem Relation Age of Onset  . Heart attack Brother     x2 brothers  . CAD Brother   . Stroke Mother    Social History  Substance Use Topics  . Smoking status: Never Smoker   . Smokeless tobacco: Never Used  . Alcohol Use: No    Review of Systems  Constitutional: Positive for appetite change and fatigue. Negative for fever.  HENT: Negative for congestion.   Respiratory: Negative for shortness of breath.   Cardiovascular: Negative for chest pain.  Gastrointestinal: Positive for abdominal pain.  Genitourinary: Negative for dysuria.  Musculoskeletal: Positive for  back pain.  Skin: Negative for rash.  Neurological: Negative for headaches.  Hematological: Does not bruise/bleed easily.      Allergies  Trazodone and nefazodone  Home Medications   Prior to Admission medications   Medication Sig Start Date End Date Taking? Authorizing Provider  aspirin 81 MG tablet Take 1 tablet (81 mg total) by mouth daily. 06/19/15  Yes Shanker Levora Dredge, MD  atorvastatin (LIPITOR) 80 MG tablet Take 1 tablet (80 mg total) by mouth daily. 09/17/15  Yes Antoine Poche, MD  bisacodyl (DULCOLAX) 5 MG EC tablet Take 5 mg by mouth daily as needed for moderate constipation.   Yes Historical Provider, MD  bisacodyl (FLEET) 10 MG/30ML ENEM Place 10 mg rectally once.   Yes Historical Provider, MD  carvedilol (COREG) 3.125 MG tablet Take 1 tablet (3.125 mg total) by mouth 2 (two) times daily with a meal. 06/19/15  Yes Shanker Levora Dredge, MD  ciprofloxacin (CIPRO) 500 MG tablet Take 1 tablet (500 mg total) by mouth 2 (two) times daily. 03/05/16  Yes Gilda Crease, MD  clopidogrel (PLAVIX) 75 MG tablet Take 1 tablet (75 mg total) by mouth daily. 06/19/15  Yes Shanker Levora Dredge, MD  cyclobenzaprine (FLEXERIL) 5 MG tablet Take 5 mg by mouth daily as needed for muscle spasms.   Yes Historical Provider, MD  docusate sodium (COLACE) 100 MG capsule Take 100 mg by mouth daily as needed for mild constipation.   Yes Historical Provider, MD  escitalopram (LEXAPRO) 5 MG tablet Take 5 mg by mouth daily.   Yes Historical Provider, MD  gabapentin (NEURONTIN) 100 MG capsule TAKE 3 CAPSULES(300 MG) BY MOUTH THREE TIMES DAILY 09/15/15  Yes Marvel Plan, MD  HYDROcodone-acetaminophen (NORCO) 10-325 MG tablet Take 1 tablet by mouth 4 (four) times daily as needed. Patient taking differently: Take 1 tablet by mouth 4 (four) times daily as needed for moderate pain.  01/16/16  Yes Erick Blinks, MD  hydrocortisone cream 0.5 % Apply 1 application topically 2 (two) times daily.   Yes Historical Provider, MD  LANTUS SOLOSTAR 100 UNIT/ML Solostar Pen Inject 20 Units into the skin at bedtime. 02/23/16  Yes Historical Provider, MD  lisinopril (PRINIVIL,ZESTRIL) 5 MG tablet Take 5 mg by mouth daily.   Yes Historical Provider, MD  metFORMIN (GLUCOPHAGE) 500 MG tablet Take 250 mg by mouth 2 (two) times daily with a meal.   Yes Historical Provider, MD  metroNIDAZOLE (FLAGYL) 500 MG tablet Take 1 tablet (500 mg total) by mouth 3 (three) times daily. 03/05/16  Yes Gilda Crease, MD  mirtazapine  (REMERON) 7.5 MG tablet Take 1 tablet (7.5 mg total) by mouth at bedtime. For depression/sleep 06/11/15  Yes Evlyn Kanner Love, PA-C  pantoprazole (PROTONIX) 40 MG tablet Take 1 tablet (40 mg total) by mouth daily. 06/19/15  Yes Shanker Levora Dredge, MD  tamsulosin (FLOMAX) 0.4 MG CAPS capsule Take 1 capsule (0.4 mg total) by mouth daily. 06/19/15  Yes Shanker Levora Dredge, MD  insulin glargine (LANTUS) 100 UNIT/ML injection Inject 0.4 mLs (40 Units total) into the skin at bedtime. Patient not taking: Reported on 03/04/2016 01/16/16   Erick Blinks, MD  Liniments (BLUE-EMU SUPER STRENGTH) CREA Apply topically.    Historical Provider, MD   BP 165/91 mmHg  Pulse 85  Temp(Src) 97.9 F (36.6 C) (Oral)  Resp 18  Ht  (1.702 m)  Wt 73.483 kg  BMI 25.37 kg/m2  SpO2 98% Physical Exam  Constitutional: He is oriented to  person, place, and time. He appears well-developed and well-nourished. No distress.  HENT:  Head: Normocephalic and atraumatic.  Mouth/Throat: Oropharynx is clear and moist.  Eyes: Conjunctivae and EOM are normal. Pupils are equal, round, and reactive to light.  Neck: Normal range of motion. Neck supple.  Cardiovascular: Normal rate, regular rhythm and normal heart sounds.   No murmur heard. Pulmonary/Chest: Effort normal and breath sounds normal. He has no wheezes.  Abdominal: Soft. Bowel sounds are normal. There is no tenderness.  Musculoskeletal: Normal range of motion.  Neurological: He is alert and oriented to person, place, and time. No cranial nerve deficit. He exhibits normal muscle tone. Coordination normal.  Skin: Skin is warm.  Nursing note and vitals reviewed.   ED Course  Procedures (including critical care time) Labs Review Labs Reviewed  BASIC METABOLIC PANEL - Abnormal; Notable for the following:    Sodium 133 (*)    Chloride 99 (*)    Glucose, Bld 350 (*)    BUN 23 (*)    Creatinine, Ser 1.44 (*)    GFR calc non Af Amer 46 (*)    GFR calc Af Amer 54 (*)    All  other components within normal limits  URINALYSIS, ROUTINE W REFLEX MICROSCOPIC (NOT AT [facility]) - Abnormal; Notable for the following:    Glucose, UA >1000 (*)    All other components within normal limits  URINE MICROSCOPIC-ADD ON - Abnormal; Notable for the following:    Squamous Epithelial / LPF 0-5 (*)    Bacteria, UA RARE (*)    All other components within normal limits  CBG MONITORING, ED - Abnormal; Notable for the following:    Glucose-Capillary 175 (*)    All other components within normal limits  CBC WITH DIFFERENTIAL/PLATELET  HEPATIC FUNCTION PANEL  LIPASE, BLOOD   Results for orders placed or performed during the hospital encounter of 03/08/16  CBC with Differential  Result Value Ref Range   WBC 9.1 4.0 - 10.5 K/uL   RBC 5.10 4.22 - 5.81 MIL/uL   Hemoglobin 15.9 13.0 - 17.0 g/dL   HCT 40.9 81.1 - 91.4 %   MCV 90.0 78.0 - 100.0 fL   MCH 31.2 26.0 - 34.0 pg   MCHC 34.6 30.0 - 36.0 g/dL   RDW 78.2 95.6 - 21.3 %   Platelets 233 150 - 400 K/uL   Neutrophils Relative % 68 %   Neutro Abs 6.3 1.7 - 7.7 K/uL   Lymphocytes Relative 18 %   Lymphs Abs 1.6 0.7 - 4.0 K/uL   Monocytes Relative 9 %   Monocytes Absolute 0.8 0.1 - 1.0 K/uL   Eosinophils Relative 4 %   Eosinophils Absolute 0.3 0.0 - 0.7 K/uL   Basophils Relative 1 %   Basophils Absolute 0.1 0.0 - 0.1 K/uL  Basic metabolic panel  Result Value Ref Range   Sodium 133 (L) 135 - 145 mmol/L   Potassium 4.8 3.5 - 5.1 mmol/L   Chloride 99 (L) 101 - 111 mmol/L   CO2 27 22 - 32 mmol/L   Glucose, Bld 350 (H) 65 - 99 mg/dL   BUN 23 (H) 6 - 20 mg/dL   Creatinine, Ser 0.86 (H) 0.61 - 1.24 mg/dL   Calcium 9.0 8.9 - 57.8 mg/dL   GFR calc non Af Amer 46 (L) >60 mL/min   GFR calc Af Amer 54 (L) >60 mL/min   Anion gap 7 5 - 15  Urinalysis, Routine w reflex microscopic (not at  [facility])  Result Value Ref Range   Color, Urine YELLOW YELLOW   APPearance CLEAR CLEAR   Specific Gravity, Urine 1.020 1.005 - 1.030   pH 5.5 5.0 - 8.0    Glucose, UA >1000 (A) NEGATIVE mg/dL   Hgb urine dipstick NEGATIVE NEGATIVE   Bilirubin Urine NEGATIVE NEGATIVE   Ketones, ur NEGATIVE NEGATIVE mg/dL   Protein, ur NEGATIVE NEGATIVE mg/dL   Nitrite NEGATIVE NEGATIVE   Leukocytes, UA NEGATIVE NEGATIVE  Urine microscopic-add on  Result Value Ref Range   Squamous Epithelial / LPF 0-5 (A) NONE SEEN   WBC, UA 0-5 0 - 5 WBC/hpf   RBC / HPF NONE SEEN 0 - 5 RBC/hpf   Bacteria, UA RARE (A) NONE SEEN  Hepatic function panel  Result Value Ref Range   Total Protein 7.2 6.5 - 8.1 g/dL   Albumin 3.7 3.5 - 5.0 g/dL   AST 27 15 - 41 U/L   ALT 24 17 - 63 U/L   Alkaline Phosphatase 78 38 - 126 U/L   Total Bilirubin 0.7 0.3 - 1.2 mg/dL   Bilirubin, Direct 0.1 0.1 - 0.5 mg/dL   Indirect Bilirubin 0.6 0.3 - 0.9 mg/dL  Lipase, blood  Result Value Ref Range   Lipase 17 11 - 51 U/L  CBG monitoring, ED  Result Value Ref Range   Glucose-Capillary 175 (H) 65 - 99 mg/dL     Imaging Review Dg Abd Acute W/chest  03/08/2016  CLINICAL DATA:  Left-sided abdominal pain.  Constipation. EXAM: DG ABDOMEN ACUTE W/ 1V CHEST COMPARISON:  CT scans dated 03/05/2016 and 12/15/2015 and radiographs dated 03/04/2016 and 04/08/2015 FINDINGS: There are no dilated loops of large or small bowel. No excessive stool in the colon. There is barium contrast in the colon from the recent CT scan. 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 No acute abnormality of the chest. Heart size is normal. Aortic atherosclerosis. Slight compression fracture of the superior endplate of T11 is noted as described on the prior CT scan. Prior fractures of L1 and L4 treated with vertebroplasty. No free air or free fluid. IMPRESSION: No acute abnormalities. Specifically, no evidence of excessive stool in the colon. Aortic atherosclerosis.  Emphysema. Electronically Signed   By: Francene BoyersJames  Maxwell M.D.   On: 03/08/2016 12:44   I have personally  reviewed and evaluated these images and lab results as part of my medical decision-making.   EKG Interpretation None      MDM   Final diagnoses:  Left lower quadrant pain  Hyperglycemia    Patient evaluated June 22 for same complaint. With negative CT and lab workup. Here today of patient's acute abdominal series without evidence of free air or constipation. Labs without significant abnormalities other than hyperglycemia. Patient given some IV insulin and blood sugar has returned to 175. No evidence of ketoacidosis. Patient seems to be having some chronic left lower quadrant abdominal pain colonoscopy at this point would be appropriate. Follow-up with primary care doctor for blood sugar control.   As noted in the history of present illness. Patient was treated with Cipro and Flagyl based on CT finding of questionable colitis. The patient is not having any diarrhea. In addition white blood cell count is normal. Patient had no improvement with the antibiotics. Again would recommend GI follow-up and colonoscopy.  Vanetta MuldersScott Danil Wedge, MD 03/08/16 1759

## 2016-03-08 NOTE — ED Notes (Signed)
Patient given discharge instruction, verbalized understand. IV removed, band aid applied. Patient ambulatory out of the department.  

## 2016-03-08 NOTE — ED Notes (Signed)
Patient's family member states patient was seen here on Thursday for constipation. States patient had bowel movement immediately after discharge but has not had one since. Patient complaining of abdominal pain at triage.

## 2016-03-09 ENCOUNTER — Ambulatory Visit (INDEPENDENT_AMBULATORY_CARE_PROVIDER_SITE_OTHER): Payer: Medicare Other | Admitting: Orthopaedic Surgery

## 2016-03-09 ENCOUNTER — Encounter: Payer: Self-pay | Admitting: Orthopaedic Surgery

## 2016-03-09 ENCOUNTER — Ambulatory Visit (INDEPENDENT_AMBULATORY_CARE_PROVIDER_SITE_OTHER): Payer: Medicare Other

## 2016-03-09 VITALS — BP 96/55 | HR 87 | Temp 97.0°F | Resp 16 | Ht 67.0 in | Wt 165.0 lb

## 2016-03-09 DIAGNOSIS — S62501D Fracture of unspecified phalanx of right thumb, subsequent encounter for fracture with routine healing: Secondary | ICD-10-CM

## 2016-03-09 DIAGNOSIS — S62309D Unspecified fracture of unspecified metacarpal bone, subsequent encounter for fracture with routine healing: Secondary | ICD-10-CM

## 2016-03-09 NOTE — Progress Notes (Signed)
CC:  My thumb is better  He has been in the splint for the left thumb metacarpal fracture.  He has little pain.  NV is intact.  X-rays were done reported separately.  Encounter Diagnoses  Name Primary?  . Thumb fracture, right, with routine healing, subsequent encounter Yes  . Fracture of metacarpal of left hand, closed, with routine healing, subsequent encounter     A new thumb gauntlet short arm cast applied.  I will see him in 8 days, x-rays in the cast.  Call if any problems.  Precautions discussed.  Electronically Signed Darreld McleanWayne Lavon Bothwell, MD 6/27/20173:00 PM

## 2016-03-17 ENCOUNTER — Ambulatory Visit: Payer: Medicare Other | Admitting: Internal Medicine

## 2016-03-18 ENCOUNTER — Ambulatory Visit: Payer: Medicare Other | Admitting: Orthopaedic Surgery

## 2016-03-18 ENCOUNTER — Encounter: Payer: Self-pay | Admitting: Orthopaedic Surgery

## 2016-03-18 ENCOUNTER — Ambulatory Visit (INDEPENDENT_AMBULATORY_CARE_PROVIDER_SITE_OTHER): Payer: Medicare Other

## 2016-03-18 VITALS — BP 92/58 | HR 73 | Temp 97.5°F | Ht 67.0 in | Wt 163.6 lb

## 2016-03-18 DIAGNOSIS — S62502D Fracture of unspecified phalanx of left thumb, subsequent encounter for fracture with routine healing: Secondary | ICD-10-CM | POA: Diagnosis not present

## 2016-03-18 DIAGNOSIS — S62309D Unspecified fracture of unspecified metacarpal bone, subsequent encounter for fracture with routine healing: Secondary | ICD-10-CM

## 2016-03-18 NOTE — Progress Notes (Signed)
CC:  It does not hurt  He has a fracture of the first metacarpal on the left and has been in a cast.  The fracture has displaced and is not aligned well in the cast.  The cast was removed and a splint applied.  I am concerned about the displacement.  He can move the thumb well but it is displaced.  I would like to have hand surgeon review x-rays and examine patient and see if this can be improved.  Patient aware.  He concurs.  He will continue the thumb splint for now to allow better evaluation.  Call if any problem.  Electronically Signed Darreld McleanWayne Reagyn Facemire, MD 7/6/201711:05 AM

## 2016-03-22 ENCOUNTER — Ambulatory Visit: Payer: Medicare Other | Admitting: Neurology

## 2016-03-22 ENCOUNTER — Ambulatory Visit (INDEPENDENT_AMBULATORY_CARE_PROVIDER_SITE_OTHER): Payer: Medicare Other | Admitting: *Deleted

## 2016-03-22 DIAGNOSIS — I639 Cerebral infarction, unspecified: Secondary | ICD-10-CM

## 2016-03-22 NOTE — Progress Notes (Signed)
[employer] Summary Report / Loop Recorder 

## 2016-03-23 ENCOUNTER — Encounter: Payer: Self-pay | Admitting: Neurology

## 2016-04-07 ENCOUNTER — Ambulatory Visit: Payer: Medicare Other | Admitting: "Endocrinology

## 2016-04-09 ENCOUNTER — Encounter (HOSPITAL_COMMUNITY): Payer: Self-pay

## 2016-04-09 ENCOUNTER — Emergency Department (HOSPITAL_COMMUNITY)
Admission: EM | Admit: 2016-04-09 | Discharge: 2016-04-10 | Disposition: A | Discharge status: Home / Self Care | Payer: Medicare Other | Attending: Emergency Medicine | Admitting: Emergency Medicine

## 2016-04-09 ENCOUNTER — Emergency Department (HOSPITAL_COMMUNITY): Payer: Medicare Other

## 2016-04-09 DIAGNOSIS — Y929 Unspecified place or not applicable: Secondary | ICD-10-CM | POA: Insufficient documentation

## 2016-04-09 DIAGNOSIS — Z7982 Long term (current) use of aspirin: Secondary | ICD-10-CM | POA: Insufficient documentation

## 2016-04-09 DIAGNOSIS — I2510 Atherosclerotic heart disease of native coronary artery without angina pectoris: Secondary | ICD-10-CM | POA: Diagnosis not present

## 2016-04-09 DIAGNOSIS — R079 Chest pain, unspecified: Secondary | ICD-10-CM

## 2016-04-09 DIAGNOSIS — Y999 Unspecified external cause status: Secondary | ICD-10-CM | POA: Insufficient documentation

## 2016-04-09 DIAGNOSIS — R739 Hyperglycemia, unspecified: Secondary | ICD-10-CM

## 2016-04-09 DIAGNOSIS — E1122 Type 2 diabetes mellitus with diabetic chronic kidney disease: Secondary | ICD-10-CM | POA: Insufficient documentation

## 2016-04-09 DIAGNOSIS — W19XXXA Unspecified fall, initial encounter: Secondary | ICD-10-CM | POA: Insufficient documentation

## 2016-04-09 DIAGNOSIS — Z794 Long term (current) use of insulin: Secondary | ICD-10-CM | POA: Insufficient documentation

## 2016-04-09 DIAGNOSIS — E1165 Type 2 diabetes mellitus with hyperglycemia: Secondary | ICD-10-CM | POA: Insufficient documentation

## 2016-04-09 DIAGNOSIS — Y939 Activity, unspecified: Secondary | ICD-10-CM | POA: Diagnosis not present

## 2016-04-09 DIAGNOSIS — N182 Chronic kidney disease, stage 2 (mild): Secondary | ICD-10-CM | POA: Insufficient documentation

## 2016-04-09 DIAGNOSIS — Z7984 Long term (current) use of oral hypoglycemic drugs: Secondary | ICD-10-CM | POA: Diagnosis not present

## 2016-04-09 DIAGNOSIS — S22000A Wedge compression fracture of unspecified thoracic vertebra, initial encounter for closed fracture: Secondary | ICD-10-CM

## 2016-04-09 DIAGNOSIS — S22080A Wedge compression fracture of T11-T12 vertebra, initial encounter for closed fracture: Secondary | ICD-10-CM | POA: Diagnosis not present

## 2016-04-09 DIAGNOSIS — I509 Heart failure, unspecified: Secondary | ICD-10-CM | POA: Diagnosis not present

## 2016-04-09 LAB — CBC
HCT: 39.3 % (ref 39.0–52.0)
Hemoglobin: 13.7 g/dL (ref 13.0–17.0)
MCH: 31.6 pg (ref 26.0–34.0)
MCHC: 34.9 g/dL (ref 30.0–36.0)
MCV: 90.6 fL (ref 78.0–100.0)
Platelets: 168 10*3/uL (ref 150–400)
RBC: 4.34 MIL/uL (ref 4.22–5.81)
RDW: 12.7 % (ref 11.5–15.5)
WBC: 6.6 10*3/uL (ref 4.0–10.5)

## 2016-04-09 LAB — BASIC METABOLIC PANEL
Anion gap: 6 (ref 5–15)
BUN: 35 mg/dL — ABNORMAL HIGH (ref 6–20)
CO2: 21 mmol/L — ABNORMAL LOW (ref 22–32)
Calcium: 8.6 mg/dL — ABNORMAL LOW (ref 8.9–10.3)
Chloride: 102 mmol/L (ref 101–111)
Creatinine, Ser: 1.58 mg/dL — ABNORMAL HIGH (ref 0.61–1.24)
GFR calc Af Amer: 48 mL/min — ABNORMAL LOW (ref >60)
GFR calc non Af Amer: 41 mL/min — ABNORMAL LOW (ref >60)
Glucose, Bld: 590 mg/dL (ref 65–99)
Potassium: 5.4 mmol/L — ABNORMAL HIGH (ref 3.5–5.1)
Sodium: 129 mmol/L — ABNORMAL LOW (ref 135–145)

## 2016-04-09 LAB — I-STAT TROPONIN, ED: Troponin i, poc: 0.03 ng/mL (ref 0.00–0.08)

## 2016-04-09 MED ORDER — SODIUM CHLORIDE 0.9 % IV BOLUS (SEPSIS)
500.0000 mL | Freq: Once | INTRAVENOUS | Status: AC
Start: 2016-04-09 — End: 2016-04-10
  Administered 2016-04-09: 500 mL via INTRAVENOUS

## 2016-04-09 MED ORDER — SODIUM CHLORIDE 0.9 % IV SOLN
INTRAVENOUS | Status: DC
  Administered 2016-04-10: 01:00:00 via INTRAVENOUS

## 2016-04-09 MED ORDER — INSULIN ASPART 100 UNIT/ML ~~LOC~~ SOLN
10.0000 [IU] | Freq: Once | SUBCUTANEOUS | Status: AC
  Administered 2016-04-09: 10 [IU] via SUBCUTANEOUS
  Filled 2016-04-09: qty 1

## 2016-04-09 MED ORDER — FENTANYL CITRATE (PF) 100 MCG/2ML IJ SOLN
50.0000 ug | Freq: Once | INTRAMUSCULAR | Status: AC
  Administered 2016-04-09: 50 ug via INTRAVENOUS
  Filled 2016-04-09: qty 2

## 2016-04-09 MED ORDER — ONDANSETRON HCL 4 MG/2ML IJ SOLN
4.0000 mg | Freq: Once | INTRAMUSCULAR | Status: AC
  Administered 2016-04-09: 4 mg via INTRAVENOUS
  Filled 2016-04-09: qty 2

## 2016-04-09 NOTE — ED Provider Notes (Signed)
[facility]-EM[facility]Y DEPT Provider Note   CSN: [id number] Arrival date & time: 04/09/16  2134  First Provider Contact:  None       History   Chief Complaint Chief Complaint  Patient presents with  . Chest Pain    HPI Lucas Morgan is a 75 y.o. male.  Patient is a 75 year old male who presents to the emergency department with a complaint of right chest pain.  The history is obtained through the patient and family members. The patient has a history of degenerative joint disease, recent fracture of the right thumb. He has a history of coronary artery disease, abdominal aortic aneurysm that is 3 cm x 3 cm on. Systolic heart failure, and type 2 diabetes.  The family reports that the patient has been having some problems with back pain since his fall 2 WEEKS ago. His pain got more intense on last evening and he was seen at the primary care physician office earlier today. The patient was given an injection of steroid medication, as he had recently received a tablet of Norco for pain. Later this evening, around 5:00 the patient developed pain in the chest on the right side. The patient was then brought to the emergency department for evaluation. The patient denies shortness of breath. The patient and the family deny any hemoptysis reported. There's been no reported difficulty with breathing or speaking. Patient has tried hydrocodone, but this has done little for his pain. It is of note that the patient is on Plavix.        Past Medical History:  Diagnosis Date  . AAA (abdominal aortic aneurysm) (HCC)    a. Abd U/S 7/14: mild aneurysmal dilatation 3x3 cm; cholelithiasis without evid of cholecystitis => repeat 1 year  . Arthritis    stenosis, lumbar region  . CHF (congestive heart failure) (HCC)   . Coronary artery disease   . Diabetes mellitus   . Dyslipidemia   . MI (myocardial infarction) (HCC) 11/09/2008   2.5 x 23 Xience V DES to the CFX  . Osteoporosis   . Renal insufficiency   .  Stroke [facility])     Patient Active Problem List   Diagnosis Date Noted  . Left leg weakness   . Acute CVA (cerebrovascular accident) (HCC) 01/14/2016  . Fall at home 01/13/2016  . Ataxia 01/13/2016  . Orthostatic hypotension 01/13/2016  . Hip pain, acute   . 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 Tremor of both hands 11/18/2015  . HLD (hyperlipidemia) 11/18/2015  . Type 2 diabetes mellitus with stage 2 chronic kidney disease, with long-term current use of insulin (HCC) 08/15/2015  . S/P carotid endarterectomy 08/15/2015  . Acute on chronic systolic congestive heart failure (HCC) 08/15/2015  . Cardiomyopathy, ischemic 06/18/2015  . Dyslipidemia 06/15/2015  . Cerebral infarction due to unspecified mechanism   . Chronic pain syndrome 06/11/2015  . Adjustment disorder with anxious mood 06/11/2015  . Aphasia S/P CVA 05/28/2015  . Right hemiparesis (HCC) 05/28/2015  . Stroke, acute, embolic (HCC) 05/27/2015  . Carotid stenosis   . Stroke (HCC) 05/24/2015  . Acute on chronic renal failure (HCC) 05/24/2015  . CVA (cerebral infarction) 05/24/2015  . Leukocytosis 05/24/2015  . Foreign body in colon 04/08/2015  . Spinal stenosis of lumbar region 10/18/2014  . Lumbar stenosis with neurogenic claudication 10/18/2014  . Thyroid nodule 03/12/2013  . AAA (abdominal aortic aneurysm) (HCC) 03/12/2013  . PULMONARY NODULE 08/14/2010  . COUGH 06/30/2010  .  HYPOTENSION 05/04/2010  . AODM 01/27/2009  . HYPERCHOLESTEROLEMIA  IIA 01/27/2009  . CAD, NATIVE VESSEL 12/26/2008    Past Surgical History:  Procedure Laterality Date  . BACK SURGERY  2015   lumbar fusion  . CORONARY ANGIOPLASTY    . CORONARY STENT PLACEMENT    . ENDARTERECTOMY Left 06/17/2015   Procedure: Left Carotid ENDARTERECTOMY with Livia Snellen Patch;  Surgeon: Chuck Hint, MD;  Location: [facility] OR;  Service: Vascular;  Laterality: Left;  . EP IMPLANTABLE DEVICE N/A 05/26/2015   Procedure:  Loop Recorder Insertion;  Surgeon: Marinus Maw, MD;  Location: [facility] INVASIVE CV LAB;  Service: Cardiovascular;  Laterality: N/A;  . 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 JOINT REPLACEMENT Left   . KNEE SURGERY    . LUMBAR LAMINECTOMY/DECOMPRESSION MICRODISCECTOMY N/A 10/18/2014   Procedure: LUMBAR LAMINECTOMY/DECOMPRESSION MICRODISCECTOMYLUMBAR THREE-FOUR ;  Surgeon: Temple Pacini, MD;  Location: [facility] NEURO ORS;  Service: Neurosurgery;  Laterality: N/A;  . TEE WITHOUT CARDIOVERSION N/A 05/26/2015   Procedure: TRANSESOPHAGEAL ECHOCARDIOGRAM (TEE);  Surgeon: Vesta Mixer, MD;  Location: [facility] ENDOSCOPY;  Service: Cardiovascular;  Laterality: N/A;       Home Medications    Prior to Admission medications   Medication Sig Start Date End Date Taking? Authorizing Provider  aspirin 81 MG tablet Take 1 tablet (81 mg total) by mouth daily. 06/19/15   Shanker Levora Dredge, MD  atorvastatin (LIPITOR) 80 MG tablet Take 1 tablet (80 mg total) by mouth daily. 09/17/15   Antoine Poche, MD  bisacodyl (DULCOLAX) 5 MG EC tablet Take 5 mg by mouth daily as needed for moderate constipation.    Historical Provider, MD  bisacodyl (FLEET) 10 MG/30ML ENEM Place 10 mg rectally once.    Historical Provider, MD  carvedilol (COREG) 3.125 MG tablet Take 1 tablet (3.125 mg total) by mouth 2 (two) times daily with a meal. 06/19/15   Shanker Levora Dredge, MD  ciprofloxacin (CIPRO) 500 MG tablet Take 1 tablet (500 mg total) by mouth 2 (two) times daily. 03/05/16   Gilda Crease, MD  clopidogrel (PLAVIX) 75 MG tablet Take 1 tablet (75 mg total) by mouth daily. 06/19/15   Shanker Levora Dredge, MD  cyclobenzaprine (FLEXERIL) 5 MG tablet Take 5 mg by mouth daily as needed for muscle spasms.    Historical Provider, MD  docusate sodium (COLACE) 100 MG capsule Take 100 mg by mouth daily as needed for mild constipation.    Historical Provider, MD  escitalopram (LEXAPRO) 5 MG tablet Take 5 mg by mouth daily.     Historical Provider, MD  gabapentin (NEURONTIN) 100 MG capsule TAKE 3 CAPSULES(300 MG) BY MOUTH THREE TIMES DAILY 09/15/15   Marvel Plan, MD  HYDROcodone-acetaminophen (NORCO) 10-325 MG tablet Take 1 tablet by mouth 4 (four) times daily as needed. Patient taking differently: Take 1 tablet by mouth 4 (four) times daily as needed for moderate pain.  01/16/16   Erick Blinks, MD  hydrocortisone cream 0.5 % Apply 1 application topically 2 (two) times daily.    Historical Provider, MD  insulin glargine (LANTUS) 100 UNIT/ML injection Inject 0.4 mLs (40 Units total) into the skin at bedtime. 01/16/16   Erick Blinks, MD  LANTUS SOLOSTAR 100 UNIT/ML Solostar Pen Inject 20 Units into the skin at bedtime. 02/23/16   Historical Provider, MD  Liniments (BLUE-EMU SUPER STRENGTH) CREA Apply topically.    Historical Provider, MD  lisinopril (PRINIVIL,ZESTRIL) 5 MG tablet  Take 5 mg by mouth daily.    Historical Provider, MD  metFORMIN (GLUCOPHAGE) 500 MG tablet Take 250 mg by mouth 2 (two) times daily with a meal.    Historical Provider, MD  metroNIDAZOLE (FLAGYL) 500 MG tablet Take 1 tablet (500 mg total) by mouth 3 (three) times daily. 03/05/16   Gilda Crease, MD  mirtazapine (REMERON) 7.5 MG tablet Take 1 tablet (7.5 mg total) by mouth at bedtime. For depression/sleep 06/11/15   Evlyn Kanner Love, PA-C  pantoprazole (PROTONIX) 40 MG tablet Take 1 tablet (40 mg total) by mouth daily. 06/19/15   Shanker Levora Dredge, MD  tamsulosin (FLOMAX) 0.4 MG CAPS capsule Take 1 capsule (0.4 mg total) by mouth daily. 06/19/15   Shanker Levora Dredge, MD    Family History Family History  Problem Relation Age of Onset  . Heart attack Brother     x2 brothers  . CAD Brother   . Stroke Mother     Social History Social History  Substance Use Topics  . Smoking status: Never Smoker  . Smokeless tobacco: Never Used  . Alcohol use No     Allergies   Trazodone and nefazodone   Review of Systems Review of Systems    Constitutional: Negative for chills and fever.  Respiratory: Negative for shortness of breath, wheezing and stridor.   Musculoskeletal: Positive for arthralgias and back pain.  All other systems reviewed and are negative.    Physical Exam Updated Vital Signs BP 135/86 (BP Location: Right Arm)   Pulse 93   Temp 98.2 F (36.8 C) (Oral)   Resp 17   Ht 5\' 9"  (1.753 m)   Wt 75.8 kg   SpO2 96%   BMI 24.66 kg/m   Physical Exam  Constitutional: He is oriented to person, place, and time. He appears well-developed and well-nourished.  Non-toxic appearance.  HENT:  Head: Normocephalic.  Right Ear: Tympanic membrane and external ear normal.  Left Ear: Tympanic membrane and external ear normal.  Eyes: EOM and lids are normal. Pupils are equal, round, and reactive to light.  Neck: Normal range of motion. Neck supple. Carotid bruit is not present.  Cardiovascular: Normal rate, regular rhythm, normal heart sounds, intact distal pulses and normal pulses.  Exam reveals no gallop and no friction rub.   No murmur heard. Pulmonary/Chest: Effort normal. No respiratory distress. He has rhonchi.  Abdominal: Soft. Bowel sounds are normal. He exhibits no distension, no fluid wave, no ascites and no pulsatile midline mass. There is no tenderness. There is no guarding.  Musculoskeletal: Normal range of motion.  Lymphadenopathy:       Head (right side): No submandibular adenopathy present.       Head (left side): No submandibular adenopathy present.    He has no cervical adenopathy.  Neurological: He is alert and oriented to person, place, and time. He has normal strength. No cranial nerve deficit or sensory deficit.  Speech slightly slurred. This is not new according to the family.  Skin: Skin is warm and dry.  Psychiatric: He has a normal mood and affect. His speech is normal.  Nursing note and vitals reviewed.    ED Treatments / Results  Labs (all labs ordered are listed, but only abnormal  results are displayed) Labs Reviewed  BASIC METABOLIC PANEL - Abnormal; Notable for the following:       Result Value   Sodium 129 (*)    Potassium 5.4 (*)    CO2 21 (*)  Glucose, Bld 590 (*)    BUN 35 (*)    Creatinine, Ser 1.58 (*)    Calcium 8.6 (*)    GFR calc non Af Amer 41 (*)    GFR calc Af Amer 48 (*)    All other components within normal limits  CBC  I-STAT TROPOININ, ED    EKG  EKG Interpretation  Date/Time:  Friday April 09 2016 21:40:27 EDT Ventricular Rate:  101 PR Interval:    QRS Duration: 143 QT Interval:  370 QTC Calculation: 480 R Axis:   -77 Text Interpretation:  Sinus tachycardia Right bundle branch block Inferolateral infarct, age indeterminate Confirmed by [name]  MD, STEPHEN ([id number]) on 04/09/2016 9:54:23 PM       Radiology Dg Chest 2 View  Result Date: 04/09/2016 CLINICAL DATA:  Initial evaluation for acute chest pain. EXAM: CHEST  2 VIEW COMPARISON:  Prior radiograph ribs/ 26/17. FINDINGS: Cardiac and mediastinal silhouettes are stable in size and contour, and remain within normal limits. Electronic loop recorder device overlies the left heart, stable. Lungs are normally inflated. Scattered emphysematous changes with pulmonary fibrotic changes noted, stable. No pulmonary edema or pleural effusion. No consolidative airspace disease. Remote compression deformity with sequela prior vertebral augmentation noted within the lumbar spine. There is new anterior wedging of the T11 vertebral body, suspicious for possible acute or subacute compression fracture. No other definite acute osseous abnormality. Metallic density overlying the right lower neck noted. IMPRESSION: 1. 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 No other active cardiopulmonary disease. 2. New compression deformity involving the T11 vertebral body. Correlation with physical exam for possible pain at this location recommended. Electronically Signed   By: Rise Mu M.D.   On:  04/09/2016 22:37   Procedures Procedures (including critical care time)  Medications Ordered in ED Medications  fentaNYL (SUBLIMAZE) injection 50 mcg (not administered)  ondansetron (ZOFRAN) injection 4 mg (not administered)     Initial Impression / Assessment and Plan / ED Course  I have reviewed the triage vital signs and the nursing notes.  Pertinent labs & imaging results that were available during my care of the patient were reviewed by me and considered in my medical decision making (see chart for details).  Clinical Course    *I have reviewed nursing notes, vital signs, and all appropriate lab and imaging results for this patient.**  Final Clinical Impressions(s) / ED Diagnoses  Vital signs reviewed on. Pulse oximetry is within normal limits at 97% on room air. Chest x-ray shows stable emphysema and chronic fibrotic changes. There is noted a new compression deformity involving the T11 vertebral body. The complete blood count is well within normal limits. Troponin is less than 0.03. The basic metabolic panel reveals a low sodium of 129 the potassium is elevated at 5.4, CO2 is low at 21 the glucose is elevated at 590. The BUN is elevated at 35, and the creatinine is elevated at 1.58.  Patient is given a bolus of 500 mL of normal saline, and 10 units of subcutaneous on insulin.   Pt to be observed for pain control. 2nd troponin pending. Hyperglycemia to me monitored. D-Dimer pending. Pt's care will be continued by Dr. Elesa Massed.   Final diagnoses:  None    New Prescriptions New Prescriptions   No medications on file     Ivery Quale, [place] 04/11/16 2236

## 2016-04-09 NOTE — ED Triage Notes (Signed)
Right sided chest pain started at 1700. Denies SOB, or any other symptoms.

## 2016-04-09 NOTE — ED Notes (Signed)
CRITICAL VALUE ALERT  Critical value received:  Glucose 590  Date of notification:  04/09/09  Time of notification: 2310  Critical value read back:Yes.    Nurse who received alert:  Rudene Anda, RN  Responding MD: Beverely Pace, [place]  Time MD responded:  [phone]

## 2016-04-10 LAB — D-DIMER, QUANTITATIVE: D-Dimer, Quant: 0.68 ug/mL-FEU — ABNORMAL HIGH (ref 0.00–0.50)

## 2016-04-10 LAB — TROPONIN I: Troponin I: <0.03 ng/mL (ref <0.03)

## 2016-04-10 LAB — CBG MONITORING, ED
GLUCOSE-CAPILLARY: 335 mg/dL — AB (ref 65–99)
GLUCOSE-CAPILLARY: 312 mg/dL — AB (ref 65–99)
Glucose-Capillary: 417 mg/dL — ABNORMAL HIGH (ref 65–99)

## 2016-04-10 MED ORDER — OXYCODONE-ACETAMINOPHEN 5-325 MG PO TABS: 1.0000 | ORAL_TABLET | Freq: Four times a day (QID) | ORAL | 0 refills | Status: DC | PRN

## 2016-04-10 MED ORDER — FENTANYL CITRATE (PF) 100 MCG/2ML IJ SOLN
50.0000 ug | Freq: Once | INTRAMUSCULAR | Status: AC
  Administered 2016-04-10: 50 ug via INTRAVENOUS
  Filled 2016-04-10: qty 2

## 2016-04-10 MED ORDER — INSULIN GLARGINE 100 UNIT/ML ~~LOC~~ SOLN
20.0000 [IU] | Freq: Once | SUBCUTANEOUS | Status: AC
  Administered 2016-04-10: 20 [IU] via SUBCUTANEOUS
  Filled 2016-04-10: qty 0.2

## 2016-04-10 NOTE — ED Provider Notes (Signed)
Medical screening examination/treatment/procedure(s) were conducted as a shared visit with non-physician practitioner(s) and myself.  I personally evaluated the patient during the encounter.  Pt is a 75 y.o. male with history of CVA, diabetes, hyperlipidemia, CAD who presents to the emergency department with right-sided chest pain. Patient describes as a sharp pain worse with movement.  Started earlier today. No associated shortness of breath, nausea, vomiting, diaphoresis or dizziness. Pain is nonexertional or pleuritic. Daughter reports he did have a fall 3 weeks ago and has been complaining of back pain. He is chronically on hydrocodone. No new numbness, tingling or focal weakness. No bowel or bladder incontinence. No urinary retention. On exam, patient has tenderness over the right chest wall without crepitus, ecchymosis or deformity. He does have some lower thoracic spine tenderness that is very mild on exam. Neurologically intact. Hemodynamically stable. EKG shows no new ischemic changes. 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 He has had 2 negative troponins. His chest x-ray showed a T11 compression fracture. Suspect this is subacute from his fall 3 weeks ago. No other injury. No neurologic deficits. I do not feel he needs emergent imaging. He is scheduled for an MRI as an outpatient. His age adjusted d-dimer is negative. Doubt dissection, pulmonary embolus. His pain has improved with fentanyl. He was hyperglycemic but not in DKA. This improved with IV fluids, insulin. I feel patient has chest wall pain and a compression fracture that is subacute that can be followed as an outpatient. His pain has been very well controlled with fentanyl and he is smiling, pain-free at the moment. Daughter is requesting admission for pain control which I do not think he needs. I feel that he can be treated as an outpatient and will discharge with Percocet. Patient's daughter is upset that we are not admitting him. Have  discussed with her at length that I do not feel he meets admission criteria. He has PCP follow-up. Discussed return precautions.   EKG Interpretation  Date/Time:  Friday April 09 2016 21:40:27 EDT Ventricular Rate:  101 PR Interval:    QRS Duration: 143 QT Interval:  370 QTC Calculation: 480 R Axis:   -77 Text Interpretation:  Sinus tachycardia Right bundle branch block Inferolateral infarct, age indeterminate Confirmed by [name]  MD, STEPHEN ([id number]) on 04/09/2016 9:54:23 PM          EKG Interpretation  Date/Time:  Saturday April 10 2016 00:36:26 EDT Ventricular Rate:  86 PR Interval:    QRS Duration: 140 QT Interval:  378 QTC Calculation: 453 R Axis:   -48 Text Interpretation:  Sinus rhythm IVCD, consider atypical RBBB Probable inferior infarct, age indeterminate No significant change since last tracing Confirmed by WARD,  DO, KRISTEN [phone]) on 04/10/2016 1:22:10 AM         Layla Maw Ward, DO 04/10/16 0321

## 2016-04-10 NOTE — ED Notes (Signed)
CRITICAL VALUE ALERT  Critical value received:  Potassium 2.7  Date of notification  04/10/16:    Time of notification:  0225  Critical value read back:Yes.    Nurse who received alert:  bkn  MD notified (1st page):  Ward  Time of first page:  0225  MD notified (2nd page):  Time of second page:  Responding MD:    Time MD responded:

## 2016-04-20 ENCOUNTER — Ambulatory Visit (INDEPENDENT_AMBULATORY_CARE_PROVIDER_SITE_OTHER): Payer: Medicare Other | Admitting: *Deleted

## 2016-04-20 DIAGNOSIS — I639 Cerebral infarction, unspecified: Secondary | ICD-10-CM | POA: Diagnosis not present

## 2016-04-20 NOTE — Progress Notes (Signed)
[employer] Summary Report / Loop Recorder 

## 2016-04-22 ENCOUNTER — Telehealth: Payer: Self-pay | Admitting: Cardiology

## 2016-04-22 LAB — CUP PACEART REMOTE DEVICE CHECK: MDC IDC SESS DTM: 20170709181147

## 2016-04-22 NOTE — Telephone Encounter (Signed)
LMOVM requesting that pt send manual transmission b/c home monitor has not updated in at least 14 days.    

## 2016-05-12 LAB — CUP PACEART REMOTE DEVICE CHECK: Date Time Interrogation Session: 20170808181129

## 2016-05-12 NOTE — Progress Notes (Signed)
[facility] summary report received. Battery status OK. Normal device function. No new symptom episodes, brady, or pause episodes. No new AF episodes. 15 tachy- all available ECGs previously addressed. Monthly summary reports and ROV/PRN

## 2016-05-19 ENCOUNTER — Ambulatory Visit: Payer: Medicare Other | Admitting: Cardiology

## 2016-05-19 NOTE — Progress Notes (Deleted)
Clinical Summary Mr. Platte is a 75 y.o.male  1. CAD/ICM/Chronic systolic HF - prior inferior MI 10/2008 with DES to LCX and staged PCI of the LAD with a DES.  - last cath 12/2009 with patent stents, severe diagonal disease that was opened with cutting balloon angioplasty. - echo 05/2014 LVEF 50-55%, abnormal diastolic function, multiple WMAs.  - 05/2015 TEE LVEF 30-35% - 06/2015 TTE LVEF 40-45%, grade I diastolic dysfunction - medications for CHF limited due to soft bp's. From neuro notes trying to keep bp reasonable due to continued obstructive cerebral vascular disease.    - no SOB or DOE. No LE edema. No recent chest pain. Limiting sodium intake. Compliant with meds. Avoiding NSAIDS  2. Hyperlipidemia - compliant with statin  3. Abdominal aortic aneurysm - incidental finding on lumbar MRI 02/2013 - AAA [place] 09/2014 2.9 x 2.9 cm - AAA [place] 09/2015 2.9 x 2.9 cm  - denies any recent abdominal symptoms  4. 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 He subsequently had left carotid endarectomy done as this was the likely etiology. TEE without source of emboli, loop recorder placed and has had no significant arrhythmias.  - he is on ASA and plavix for secondary prevention per neuro   5. Carotid stenosis - s/p left CEA 06/2015 in setting of CVA. He continues to be followed by vascular.  Past Medical History:  Diagnosis Date  . AAA (abdominal aortic aneurysm) (HCC)    a. Abd U/S 7/14: mild aneurysmal dilatation 3x3 cm; cholelithiasis without evid of cholecystitis => repeat 1 year  . Arthritis    stenosis, lumbar region  . CHF (congestive heart failure) (HCC)   . Coronary artery disease   . Diabetes mellitus   . Dyslipidemia   . MI (myocardial infarction) (HCC) 11/09/2008   2.5 x 23 Xience V DES to the CFX  . Osteoporosis   . Renal insufficiency   . Stroke [facility])      Allergies  Allergen Reactions  . Trazodone And Nefazodone Other (See Comments)    High blood  sugar     Current Outpatient Prescriptions  Medication Sig Dispense Refill  . aspirin 81 MG tablet Take 1 tablet (81 mg total) by mouth daily.    Marland Kitchen atorvastatin (LIPITOR) 80 MG tablet Take 1 tablet (80 mg total) by mouth daily. 90 tablet 3  . bisacodyl (DULCOLAX) 5 MG EC tablet Take 5 mg by mouth daily as needed for moderate constipation.    . bisacodyl (FLEET) 10 MG/30ML ENEM Place 10 mg rectally once.    . carvedilol (COREG) 3.125 MG tablet Take 1 tablet (3.125 mg total) by mouth 2 (two) times daily with a meal. 60 tablet 0  . ciprofloxacin (CIPRO) 500 MG tablet Take 1 tablet (500 mg total) by mouth 2 (two) times daily. 20 tablet 0  . clopidogrel (PLAVIX) 75 MG tablet Take 1 tablet (75 mg total) by mouth daily. 30 tablet 0  . cyclobenzaprine (FLEXERIL) 5 MG tablet Take 5 mg by mouth daily as needed for muscle spasms.    Marland Kitchen docusate sodium (COLACE) 100 MG capsule Take 100 mg by mouth daily as needed for mild constipation.    Marland Kitchen escitalopram (LEXAPRO) 5 MG tablet Take 5 mg by mouth daily.    Marland Kitchen gabapentin (NEURONTIN) 100 MG capsule TAKE 3 CAPSULES(300 MG) BY MOUTH THREE TIMES DAILY 90 capsule 3  . hydrocortisone cream 0.5 % Apply 1 application topically 2 (two) times daily.    Marland Kitchen  insulin glargine (LANTUS) 100 UNIT/ML injection Inject 0.4 mLs (40 Units total) into the skin at bedtime. 10 mL 11  . LANTUS SOLOSTAR 100 UNIT/ML Solostar Pen Inject 20 Units into the skin at bedtime.  1  . Liniments (BLUE-EMU SUPER STRENGTH) CREA Apply topically.    Marland Kitchen lisinopril (PRINIVIL,ZESTRIL) 5 MG tablet Take 5 mg by mouth daily.    . metFORMIN (GLUCOPHAGE) 500 MG tablet Take 250 mg by mouth 2 (two) times daily with a meal.    . metroNIDAZOLE (FLAGYL) 500 MG tablet Take 1 tablet (500 mg total) by mouth 3 (three) times daily. 30 tablet 0  . mirtazapine (REMERON) 7.5 MG tablet Take 1 tablet (7.5 mg total) by mouth at bedtime. For depression/sleep 30 tablet 1  . oxyCODONE-acetaminophen (PERCOCET/ROXICET) 5-325 MG  tablet Take 1-2 tablets by mouth every 6 (six) hours as needed. 20 tablet 0  . pantoprazole (PROTONIX) 40 MG tablet Take 1 tablet (40 mg total) by mouth daily. 30 tablet 0  . tamsulosin (FLOMAX) 0.4 MG CAPS capsule Take 1 capsule (0.4 mg total) by mouth daily. 30 capsule 0   No current facility-administered medications for this visit.      Past Surgical History:  Procedure Laterality Date  . BACK SURGERY  2015   lumbar fusion  . CORONARY ANGIOPLASTY    . CORONARY STENT PLACEMENT    . ENDARTERECTOMY Left 06/17/2015   Procedure: Left Carotid ENDARTERECTOMY with Livia Snellen Patch;  Surgeon: Chuck Hint, MD;  Location: [facility] OR;  Service: Vascular;  Laterality: Left;  . EP IMPLANTABLE DEVICE N/A 05/26/2015   Procedure: Loop Recorder Insertion;  Surgeon: Marinus Maw, MD;  Location: [facility] INVASIVE CV LAB;  Service: Cardiovascular;  Laterality: N/A;  . 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 JOINT REPLACEMENT Left   . KNEE SURGERY    . LUMBAR LAMINECTOMY/DECOMPRESSION MICRODISCECTOMY N/A 10/18/2014   Procedure: LUMBAR LAMINECTOMY/DECOMPRESSION MICRODISCECTOMYLUMBAR THREE-FOUR ;  Surgeon: Temple Pacini, MD;  Location: [facility] NEURO ORS;  Service: Neurosurgery;  Laterality: N/A;  . TEE WITHOUT CARDIOVERSION N/A 05/26/2015   Procedure: TRANSESOPHAGEAL ECHOCARDIOGRAM (TEE);  Surgeon: Vesta Mixer, MD;  Location: [facility] ENDOSCOPY;  Service: Cardiovascular;  Laterality: N/A;     Allergies  Allergen Reactions  . Trazodone And Nefazodone Other (See Comments)    High blood sugar      Family History  Problem Relation Age of Onset  . Heart attack Brother     x2 brothers  . CAD Brother   . Stroke Mother      Social History Mr. Stickels reports that he has never smoked. He has never used smokeless tobacco. Mr. Brotherton reports that he does not drink alcohol.   Review of Systems CONSTITUTIONAL: No weight loss, fever, chills, weakness or fatigue.  HEENT: Eyes: No  visual loss, blurred vision, double vision or yellow sclerae.No hearing loss, sneezing, congestion, runny nose or sore throat.  SKIN: No rash or itching.  CARDIOVASCULAR:  RESPIRATORY: No shortness of breath, cough or sputum.  GASTROINTESTINAL: No anorexia, nausea, vomiting or diarrhea. No abdominal pain or blood.  GENITOURINARY: No burning on urination, no polyuria NEUROLOGICAL: No headache, dizziness, syncope, paralysis, ataxia, numbness or tingling in the extremities. No change in bowel or bladder control.  MUSCULOSKELETAL: No muscle, back pain, joint pain or stiffness.  LYMPHATICS: No enlarged nodes. No history of splenectomy.  PSYCHIATRIC: No history of depression or anxiety.  ENDOCRINOLOGIC: No reports of sweating, cold or  heat intolerance. No polyuria or polydipsia.  Marland Kitchen.   Physical Examination There were no vitals filed for this visit. There were no vitals filed for this visit.  Gen: resting comfortably, no acute distress HEENT: no scleral icterus, pupils equal round and reactive, no palptable cervical adenopathy,  CV Resp: Clear to auscultation bilaterally GI: abdomen is soft, non-tender, non-distended, normal bowel sounds, no hepatosplenomegaly MSK: extremities are warm, no edema.  Skin: warm, no rash Neuro:  no focal deficits Psych: appropriate affect   Diagnostic Studies Cath 12/2009 PROCEDURAL FINDINGS: The right coronary artery is small and nondominant. There is no significant obstructive disease.  Left mainstem: The left main is widely patent. There is no significant stenosis. The left main divides into the LAD and left circumflex.  LAD: The LAD is patent throughout its course. There is a widely patent stent in the proximal LAD with no significant in-stent restenosis. There are luminal irregularities throughout the mid LAD without obstructive disease. The first diagonal is a large Meaghen Vecchiarelli with severe ostial narrowing. There is TIMI III flow but the ostium  of the diagonal appears hypodense with 95-99% stenosis.  Left circumflex: The circumflex is patent. The first OM Lyda Colcord has a patent stent and it is a large Graelyn Bihl. The stent has no significant restenosis. The AV groove circumflex courses down and has minor luminal irregularities but no significant stenosis is noted. The left circumflex is dominant and supplies a small left PDA.  FINAL ASSESSMENT: 1. Severe ostial diagonal stenosis with successful cutting balloon  angioplasty. 2. Continued patency of the left anterior descending (coronary artery)  and obtuse marginal stents with otherwise nonobstructive coronary  artery disease.  10/2009 MPI IMPRESSION: Large perfusion defect identified at the lateral and inferior walls of the left ventricle with evidence of significant reperfusion at the lateral wall portion of the defect on resting exam. Mildly decreased left ventricle ejection fraction of 43%. Hypokinesia of the lateral and inferior walls of the left ventricle.  05/2014 Echo LVEF 50-55%, abnormal diastolic function.   02/2015 ABI Normal bilaterally  06/2015 echo Study Conclusions  - Left ventricle: The cavity size was normal. Wall thickness was normal. Systolic function was mildly to moderately reduced. The estimated ejection fraction was in the range of 40% to 45%. Diffuse hypokinesis. LV apical false tendon. Doppler parameters are consistent with abnormal left ventricular relaxation (grade 1 diastolic dysfunction). The E/e&' ratio is between 8-15, suggesting indeterminate LV filling pressure. - Aortic valve: Calcified with a mobile density that appears attached to the non-coronary cusp. There was no regurgitation. - Mitral valve: Sclerotic leaflet tips. Trivial MR. - Left atrium: The atrium was normal in size. - Right atrium: The atrium was mildly dilated. - Atrial septum: Mobile IAS. PFO cannot be excluded. - Tricuspid valve: There  was trivial regurgitation.  Impressions:  - Compared to a prior study in 05/2015, the EF has improved to 40-45%. There is calcification of the aortic valve and a possible small mobile density on the non-coronary cusp that could be a sounce of embolism.   05/2015 Carotid US Summary: Findings suggest 1-39% right internal carotid artery stenosis and 60-79% left internal carotid artery stenosis. The right vertebral artery exhibits an atypical antegrade waveform. which suggests distal stenosis likely. The left vertebral artery is patent with antegrade flow.  05/2015 TEE Study Conclusions  - Left ventricle: Systolic function was moderately to severely reduced. The estimated ejection fraction was in the range of 30% to 35%. - Aortic valve: No evidence of vegetation. -  Mitral valve: No evidence of vegetation. There was mild regurgitation. - Left atrium: No evidence of thrombus in the atrial cavity or appendage. - Atrial septum: No defect or patent foramen ovale was identified. - Tricuspid valve: No evidence of vegetation.      Assessment and Plan  1.CAD/ICM/Chronic systolic HF - medical therapy limited by low bp's -  no current symptoms - continue current meds  2. Hyperlipidemia - continue statin, we will request labs from pcp  3. AAA - stable by last [place], continue to monitor  4. CVA - secondary prevention per neuro - do not recommend repeating TEE, previous showed calcified lambl's excresence, would not change management  5. Carotid stenosis - continue to follow with vascular   F/u 3 months      Antoine Poche, M.D., F.A.C.C.

## 2016-05-20 ENCOUNTER — Ambulatory Visit (INDEPENDENT_AMBULATORY_CARE_PROVIDER_SITE_OTHER): Payer: Medicare Other | Admitting: *Deleted

## 2016-05-20 ENCOUNTER — Telehealth: Payer: Self-pay | Admitting: *Deleted

## 2016-05-20 DIAGNOSIS — I639 Cerebral infarction, unspecified: Secondary | ICD-10-CM | POA: Diagnosis not present

## 2016-05-20 NOTE — Progress Notes (Signed)
[employer] Summary Report / Loop Recorder 

## 2016-05-20 NOTE — Telephone Encounter (Signed)
-----   Message from Antoine PocheJonathan F Branch, MD sent at 05/19/2016  2:44 PM EDT ----- Nov is fine   Dominga FerryJ Branch MD ----- Message ----- From: Albertine PatriciaStaci T Newt Levingston, CMA Sent: 05/18/2016   9:42 AM To: Antoine PocheJonathan F Branch, MD  This pt is scheduled to see you tomorrow, looks like he is due in November, I don't see any notes as to why he was scheduled. Do you want to see him or reschedule ?  Obbie Lewallen

## 2016-05-26 ENCOUNTER — Ambulatory Visit: Payer: Medicare Other | Admitting: Internal Medicine

## 2016-06-19 LAB — CUP PACEART REMOTE DEVICE CHECK: Date Time Interrogation Session: 20170907180738

## 2016-06-19 NOTE — Progress Notes (Signed)
[facility] summary report received. Battery status OK. Normal device function. No new symptom episodes, brady, or pause episodes. Tachy and AF episodes oversensing.  Monthly summary reports and ROV/PRN

## 2016-06-21 ENCOUNTER — Encounter (HOSPITAL_COMMUNITY): Payer: Self-pay | Admitting: Emergency Medicine

## 2016-06-21 ENCOUNTER — Inpatient Hospital Stay (HOSPITAL_COMMUNITY)
Admission: EM | Admit: 2016-06-21 | Discharge: 2016-06-25 | DRG: 637 | Disposition: A | Discharge status: Skilled Nursing Facility | Payer: Medicare Other | Attending: Internal Medicine | Admitting: Internal Medicine

## 2016-06-21 ENCOUNTER — Emergency Department (HOSPITAL_COMMUNITY): Payer: Medicare Other

## 2016-06-21 ENCOUNTER — Ambulatory Visit (INDEPENDENT_AMBULATORY_CARE_PROVIDER_SITE_OTHER): Payer: Medicare Other | Admitting: *Deleted

## 2016-06-21 DIAGNOSIS — I2510 Atherosclerotic heart disease of native coronary artery without angina pectoris: Secondary | ICD-10-CM | POA: Diagnosis present

## 2016-06-21 DIAGNOSIS — J849 Interstitial pulmonary disease, unspecified: Secondary | ICD-10-CM | POA: Diagnosis present

## 2016-06-21 DIAGNOSIS — R4182 Altered mental status, unspecified: Secondary | ICD-10-CM | POA: Diagnosis present

## 2016-06-21 DIAGNOSIS — N182 Chronic kidney disease, stage 2 (mild): Secondary | ICD-10-CM

## 2016-06-21 DIAGNOSIS — R1311 Dysphagia, oral phase: Secondary | ICD-10-CM

## 2016-06-21 DIAGNOSIS — N179 Acute kidney failure, unspecified: Secondary | ICD-10-CM | POA: Diagnosis not present

## 2016-06-21 DIAGNOSIS — N390 Urinary tract infection, site not specified: Secondary | ICD-10-CM | POA: Diagnosis present

## 2016-06-21 DIAGNOSIS — N183 Chronic kidney disease, stage 3 (moderate): Secondary | ICD-10-CM | POA: Diagnosis present

## 2016-06-21 DIAGNOSIS — R739 Hyperglycemia, unspecified: Secondary | ICD-10-CM

## 2016-06-21 DIAGNOSIS — G894 Chronic pain syndrome: Secondary | ICD-10-CM | POA: Diagnosis present

## 2016-06-21 DIAGNOSIS — I252 Old myocardial infarction: Secondary | ICD-10-CM

## 2016-06-21 DIAGNOSIS — M6281 Muscle weakness (generalized): Secondary | ICD-10-CM

## 2016-06-21 DIAGNOSIS — I5042 Chronic combined systolic (congestive) and diastolic (congestive) heart failure: Secondary | ICD-10-CM | POA: Diagnosis present

## 2016-06-21 DIAGNOSIS — I248 Other forms of acute ischemic heart disease: Secondary | ICD-10-CM | POA: Diagnosis present

## 2016-06-21 DIAGNOSIS — I959 Hypotension, unspecified: Secondary | ICD-10-CM | POA: Diagnosis present

## 2016-06-21 DIAGNOSIS — R338 Other retention of urine: Secondary | ICD-10-CM | POA: Diagnosis present

## 2016-06-21 DIAGNOSIS — Z7902 Long term (current) use of antithrombotics/antiplatelets: Secondary | ICD-10-CM

## 2016-06-21 DIAGNOSIS — Z79899 Other long term (current) drug therapy: Secondary | ICD-10-CM

## 2016-06-21 DIAGNOSIS — I4891 Unspecified atrial fibrillation: Secondary | ICD-10-CM | POA: Diagnosis present

## 2016-06-21 DIAGNOSIS — M810 Age-related osteoporosis without current pathological fracture: Secondary | ICD-10-CM | POA: Diagnosis present

## 2016-06-21 DIAGNOSIS — E785 Hyperlipidemia, unspecified: Secondary | ICD-10-CM | POA: Diagnosis not present

## 2016-06-21 DIAGNOSIS — F0390 Unspecified dementia without behavioral disturbance: Secondary | ICD-10-CM | POA: Diagnosis present

## 2016-06-21 DIAGNOSIS — I639 Cerebral infarction, unspecified: Secondary | ICD-10-CM | POA: Diagnosis not present

## 2016-06-21 DIAGNOSIS — G9340 Encephalopathy, unspecified: Secondary | ICD-10-CM | POA: Diagnosis present

## 2016-06-21 DIAGNOSIS — Z7982 Long term (current) use of aspirin: Secondary | ICD-10-CM

## 2016-06-21 DIAGNOSIS — R0989 Other specified symptoms and signs involving the circulatory and respiratory systems: Secondary | ICD-10-CM

## 2016-06-21 DIAGNOSIS — Z23 Encounter for immunization: Secondary | ICD-10-CM

## 2016-06-21 DIAGNOSIS — D72829 Elevated white blood cell count, unspecified: Secondary | ICD-10-CM

## 2016-06-21 DIAGNOSIS — E860 Dehydration: Secondary | ICD-10-CM | POA: Diagnosis present

## 2016-06-21 DIAGNOSIS — Z8673 Personal history of transient ischemic attack (TIA), and cerebral infarction without residual deficits: Secondary | ICD-10-CM

## 2016-06-21 DIAGNOSIS — R778 Other specified abnormalities of plasma proteins: Secondary | ICD-10-CM | POA: Diagnosis present

## 2016-06-21 DIAGNOSIS — N401 Enlarged prostate with lower urinary tract symptoms: Secondary | ICD-10-CM | POA: Diagnosis present

## 2016-06-21 DIAGNOSIS — E1100 Type 2 diabetes mellitus with hyperosmolarity without nonketotic hyperglycemic-hyperosmolar coma (NKHHC): Secondary | ICD-10-CM | POA: Diagnosis not present

## 2016-06-21 DIAGNOSIS — Z9660 Presence of unspecified orthopedic joint implant: Secondary | ICD-10-CM | POA: Diagnosis present

## 2016-06-21 DIAGNOSIS — E876 Hypokalemia: Secondary | ICD-10-CM | POA: Diagnosis present

## 2016-06-21 DIAGNOSIS — I6529 Occlusion and stenosis of unspecified carotid artery: Secondary | ICD-10-CM

## 2016-06-21 DIAGNOSIS — R339 Retention of urine, unspecified: Secondary | ICD-10-CM | POA: Diagnosis present

## 2016-06-21 DIAGNOSIS — G92 Toxic encephalopathy: Secondary | ICD-10-CM | POA: Diagnosis present

## 2016-06-21 DIAGNOSIS — Z981 Arthrodesis status: Secondary | ICD-10-CM

## 2016-06-21 DIAGNOSIS — R7989 Other specified abnormal findings of blood chemistry: Secondary | ICD-10-CM

## 2016-06-21 DIAGNOSIS — Z955 Presence of coronary angioplasty implant and graft: Secondary | ICD-10-CM

## 2016-06-21 DIAGNOSIS — Z794 Long term (current) use of insulin: Secondary | ICD-10-CM

## 2016-06-21 DIAGNOSIS — F0150 Vascular dementia without behavioral disturbance: Secondary | ICD-10-CM | POA: Diagnosis present

## 2016-06-21 DIAGNOSIS — N189 Chronic kidney disease, unspecified: Secondary | ICD-10-CM

## 2016-06-21 DIAGNOSIS — E1122 Type 2 diabetes mellitus with diabetic chronic kidney disease: Secondary | ICD-10-CM | POA: Diagnosis present

## 2016-06-21 HISTORY — DX: Interstitial pulmonary disease, unspecified: J84.9

## 2016-06-21 LAB — ACETAMINOPHEN LEVEL: Acetaminophen (Tylenol), Serum: <10 ug/mL — ABNORMAL LOW (ref 10–30)

## 2016-06-21 LAB — GLUCOSE, CAPILLARY
GLUCOSE-CAPILLARY: 253 mg/dL — AB (ref 65–99)
GLUCOSE-CAPILLARY: 196 mg/dL — AB (ref 65–99)
Glucose-Capillary: 289 mg/dL — ABNORMAL HIGH (ref 65–99)
Glucose-Capillary: 145 mg/dL — ABNORMAL HIGH (ref 65–99)
Glucose-Capillary: 156 mg/dL — ABNORMAL HIGH (ref 65–99)

## 2016-06-21 LAB — CBC WITH DIFFERENTIAL/PLATELET
BASOS PCT: 0 %
Basophils Absolute: 0 10*3/uL (ref 0.0–0.1)
Eosinophils Absolute: 0 10*3/uL (ref 0.0–0.7)
Eosinophils Relative: 0 %
HEMATOCRIT: 39.5 % (ref 39.0–52.0)
Hemoglobin: 14 g/dL (ref 13.0–17.0)
Lymphocytes Relative: 11 %
Lymphs Abs: 1.5 10*3/uL (ref 0.7–4.0)
MCH: 32.6 pg (ref 26.0–34.0)
MCHC: 35.4 g/dL (ref 30.0–36.0)
MCV: 92.1 fL (ref 78.0–100.0)
MONO ABS: 1.5 10*3/uL — AB (ref 0.1–1.0)
MONOS PCT: 11 %
NEUTROS ABS: 11 10*3/uL — AB (ref 1.7–7.7)
Neutrophils Relative %: 78 %
Platelets: 194 10*3/uL (ref 150–400)
RBC: 4.29 MIL/uL (ref 4.22–5.81)
RDW: 12.9 % (ref 11.5–15.5)
WBC: 14.1 10*3/uL — ABNORMAL HIGH (ref 4.0–10.5)

## 2016-06-21 LAB — RAPID URINE DRUG SCREEN, HOSP PERFORMED
AMPHETAMINES: NOT DETECTED
BENZODIAZEPINES: NOT DETECTED
Barbiturates: NOT DETECTED
COCAINE: NOT DETECTED
OPIATES: POSITIVE — AB
TETRAHYDROCANNABINOL: NOT DETECTED

## 2016-06-21 LAB — URINALYSIS, ROUTINE W REFLEX MICROSCOPIC
Bilirubin Urine: NEGATIVE
Glucose, UA: >1000 mg/dL — AB
Ketones, ur: 15 mg/dL — AB
LEUKOCYTES UA: NEGATIVE
NITRITE: NEGATIVE
PROTEIN: NEGATIVE mg/dL
SPECIFIC GRAVITY, URINE: 1.01 (ref 1.005–1.030)
pH: 5.5 (ref 5.0–8.0)

## 2016-06-21 LAB — COMPREHENSIVE METABOLIC PANEL
ALBUMIN: 3.9 g/dL (ref 3.5–5.0)
ALT: 18 U/L (ref 17–63)
ANION GAP: 13 (ref 5–15)
AST: 28 U/L (ref 15–41)
Alkaline Phosphatase: 79 U/L (ref 38–126)
BUN: 33 mg/dL — AB (ref 6–20)
CHLORIDE: 96 mmol/L — AB (ref 101–111)
CO2: 22 mmol/L (ref 22–32)
Calcium: 9.2 mg/dL (ref 8.9–10.3)
Creatinine, Ser: 1.88 mg/dL — ABNORMAL HIGH (ref 0.61–1.24)
GFR calc Af Amer: 39 mL/min — ABNORMAL LOW (ref >60)
GFR, EST NON AFRICAN AMERICAN: 34 mL/min — AB (ref >60)
Glucose, Bld: 584 mg/dL (ref 65–99)
POTASSIUM: 4.4 mmol/L (ref 3.5–5.1)
Sodium: 131 mmol/L — ABNORMAL LOW (ref 135–145)
Total Bilirubin: 1.7 mg/dL — ABNORMAL HIGH (ref 0.3–1.2)
Total Protein: 6.9 g/dL (ref 6.5–8.1)

## 2016-06-21 LAB — MRSA PCR SCREENING: MRSA by PCR: POSITIVE — AB

## 2016-06-21 LAB — BASIC METABOLIC PANEL
Anion gap: 10 (ref 5–15)
BUN: 27 mg/dL — AB (ref 6–20)
CO2: 22 mmol/L (ref 22–32)
CREATININE: 1.36 mg/dL — AB (ref 0.61–1.24)
Calcium: 8.6 mg/dL — ABNORMAL LOW (ref 8.9–10.3)
Chloride: 104 mmol/L (ref 101–111)
GFR calc Af Amer: 58 mL/min — ABNORMAL LOW (ref >60)
GFR, EST NON AFRICAN AMERICAN: 50 mL/min — AB (ref >60)
Glucose, Bld: 146 mg/dL — ABNORMAL HIGH (ref 65–99)
Potassium: 3.2 mmol/L — ABNORMAL LOW (ref 3.5–5.1)
Sodium: 136 mmol/L (ref 135–145)

## 2016-06-21 LAB — URINE MICROSCOPIC-ADD ON

## 2016-06-21 LAB — CBG MONITORING, ED
GLUCOSE-CAPILLARY: 583 mg/dL — AB (ref 65–99)
GLUCOSE-CAPILLARY: 491 mg/dL — AB (ref 65–99)
Glucose-Capillary: 349 mg/dL — ABNORMAL HIGH (ref 65–99)

## 2016-06-21 LAB — SALICYLATE LEVEL: Salicylate Lvl: <7 mg/dL (ref 2.8–30.0)

## 2016-06-21 LAB — MAGNESIUM: Magnesium: 1.1 mg/dL — ABNORMAL LOW (ref 1.7–2.4)

## 2016-06-21 LAB — TROPONIN I
TROPONIN I: 0.06 ng/mL — AB (ref <0.03)
TROPONIN I: 0.1 ng/mL — AB (ref <0.03)

## 2016-06-21 LAB — ETHANOL

## 2016-06-21 MED ORDER — SODIUM CHLORIDE 0.9 % IV SOLN
INTRAVENOUS | Status: DC
  Administered 2016-06-21: 18:00:00 via INTRAVENOUS

## 2016-06-21 MED ORDER — ONDANSETRON HCL 4 MG PO TABS: 4.0000 mg | ORAL_TABLET | Freq: Four times a day (QID) | ORAL | Status: DC | PRN

## 2016-06-21 MED ORDER — FENTANYL CITRATE (PF) 100 MCG/2ML IJ SOLN
6.5000 ug | INTRAMUSCULAR | Status: DC | PRN
  Administered 2016-06-21: 6.5 ug via INTRAVENOUS
  Filled 2016-06-21 (×2): qty 2

## 2016-06-21 MED ORDER — CARVEDILOL 3.125 MG PO TABS
3.1250 mg | ORAL_TABLET | Freq: Two times a day (BID) | ORAL | Status: DC
  Administered 2016-06-21 – 2016-06-25 (×7): 3.125 mg via ORAL
  Filled 2016-06-21 (×7): qty 1

## 2016-06-21 MED ORDER — DEXTROSE 5 % IV SOLN
1.0000 g | Freq: Once | INTRAVENOUS | Status: AC
Start: 2016-06-21 — End: 2016-06-21
  Administered 2016-06-21: 1 g via INTRAVENOUS
  Filled 2016-06-21: qty 10

## 2016-06-21 MED ORDER — ENOXAPARIN SODIUM 40 MG/0.4ML ~~LOC~~ SOLN
40.0000 mg | SUBCUTANEOUS | Status: DC
  Administered 2016-06-21 – 2016-06-24 (×4): 40 mg via SUBCUTANEOUS
  Filled 2016-06-21 (×4): qty 0.4

## 2016-06-21 MED ORDER — DEXTROSE-NACL 5-0.45 % IV SOLN
INTRAVENOUS | Status: DC
  Administered 2016-06-21: 19:00:00 via INTRAVENOUS

## 2016-06-21 MED ORDER — INSULIN REGULAR BOLUS VIA INFUSION
0.0000 [IU] | Freq: Three times a day (TID) | INTRAVENOUS | Status: DC
  Filled 2016-06-21: qty 10

## 2016-06-21 MED ORDER — CITALOPRAM HYDROBROMIDE 20 MG PO TABS
20.0000 mg | ORAL_TABLET | Freq: Every day | ORAL | Status: DC
  Administered 2016-06-21 – 2016-06-25 (×5): 20 mg via ORAL
  Filled 2016-06-21 (×5): qty 1

## 2016-06-21 MED ORDER — TAMSULOSIN HCL 0.4 MG PO CAPS
0.4000 mg | ORAL_CAPSULE | Freq: Every day | ORAL | Status: DC
  Administered 2016-06-22 – 2016-06-25 (×4): 0.4 mg via ORAL
  Filled 2016-06-21 (×4): qty 1

## 2016-06-21 MED ORDER — INSULIN DETEMIR 100 UNIT/ML ~~LOC~~ SOLN
20.0000 [IU] | Freq: Every day | SUBCUTANEOUS | Status: DC
  Administered 2016-06-22: 20 [IU] via SUBCUTANEOUS
  Filled 2016-06-21: qty 0.2

## 2016-06-21 MED ORDER — SODIUM CHLORIDE 0.9 % IV SOLN: INTRAVENOUS | Status: DC

## 2016-06-21 MED ORDER — ONDANSETRON HCL 4 MG/2ML IJ SOLN: 4.0000 mg | Freq: Four times a day (QID) | INTRAMUSCULAR | Status: DC | PRN

## 2016-06-21 MED ORDER — INSULIN DETEMIR 100 UNIT/ML ~~LOC~~ SOLN
20.0000 [IU] | Freq: Once | SUBCUTANEOUS | Status: AC
  Administered 2016-06-21: 20 [IU] via SUBCUTANEOUS
  Filled 2016-06-21: qty 0.2

## 2016-06-21 MED ORDER — SODIUM CHLORIDE 0.9 % IV BOLUS (SEPSIS)
1000.0000 mL | Freq: Once | INTRAVENOUS | Status: AC
Start: 2016-06-21 — End: 2016-06-21
  Administered 2016-06-21: 1000 mL via INTRAVENOUS

## 2016-06-21 MED ORDER — DEXTROSE 50 % IV SOLN: 25.0000 mL | INTRAVENOUS | Status: DC | PRN

## 2016-06-21 MED ORDER — SODIUM CHLORIDE 0.9 % IV SOLN
INTRAVENOUS | Status: DC
  Administered 2016-06-21: 22:00:00 via INTRAVENOUS

## 2016-06-21 MED ORDER — CYCLOBENZAPRINE HCL 10 MG PO TABS: 5.0000 mg | ORAL_TABLET | Freq: Every day | ORAL | Status: DC | PRN

## 2016-06-21 MED ORDER — ACETAMINOPHEN 650 MG RE SUPP: 650.0000 mg | Freq: Four times a day (QID) | RECTAL | Status: DC | PRN

## 2016-06-21 MED ORDER — SODIUM CHLORIDE 0.9 % IV SOLN
INTRAVENOUS | Status: DC
  Administered 2016-06-21: 4.3 [IU]/h via INTRAVENOUS
  Filled 2016-06-21: qty 2.5

## 2016-06-21 MED ORDER — MUPIROCIN 2 % EX OINT
1.0000 "application " | TOPICAL_OINTMENT | Freq: Two times a day (BID) | CUTANEOUS | Status: DC
  Administered 2016-06-22 – 2016-06-25 (×8): 1 via NASAL
  Filled 2016-06-21 (×3): qty 22

## 2016-06-21 MED ORDER — CLOPIDOGREL BISULFATE 75 MG PO TABS
75.0000 mg | ORAL_TABLET | Freq: Every day | ORAL | Status: DC
  Administered 2016-06-21 – 2016-06-25 (×5): 75 mg via ORAL
  Filled 2016-06-21 (×5): qty 1

## 2016-06-21 MED ORDER — DEXTROSE 5 % IV SOLN
1.0000 g | INTRAVENOUS | Status: DC
  Administered 2016-06-22: 1 g via INTRAVENOUS
  Filled 2016-06-21 (×2): qty 10

## 2016-06-21 MED ORDER — MAGNESIUM SULFATE 2 GM/50ML IV SOLN
2.0000 g | Freq: Once | INTRAVENOUS | Status: AC
  Administered 2016-06-21: 2 g via INTRAVENOUS
  Filled 2016-06-21: qty 50

## 2016-06-21 MED ORDER — ATORVASTATIN CALCIUM 40 MG PO TABS
80.0000 mg | ORAL_TABLET | Freq: Every day | ORAL | Status: DC
  Administered 2016-06-21 – 2016-06-24 (×5): 80 mg via ORAL
  Filled 2016-06-21 (×4): qty 2

## 2016-06-21 MED ORDER — INSULIN ASPART 100 UNIT/ML ~~LOC~~ SOLN
0.0000 [IU] | SUBCUTANEOUS | Status: DC
  Administered 2016-06-22: 4 [IU] via SUBCUTANEOUS

## 2016-06-21 MED ORDER — OXYCODONE-ACETAMINOPHEN 5-325 MG PO TABS
1.0000 | ORAL_TABLET | Freq: Four times a day (QID) | ORAL | Status: DC | PRN
  Administered 2016-06-21 – 2016-06-22 (×2): 1 via ORAL
  Filled 2016-06-21 (×2): qty 1

## 2016-06-21 MED ORDER — ACETAMINOPHEN 325 MG PO TABS
650.0000 mg | ORAL_TABLET | Freq: Four times a day (QID) | ORAL | Status: DC | PRN
  Administered 2016-06-22: 650 mg via ORAL
  Filled 2016-06-21: qty 2

## 2016-06-21 MED ORDER — GABAPENTIN 300 MG PO CAPS
300.0000 mg | ORAL_CAPSULE | Freq: Three times a day (TID) | ORAL | Status: DC
  Administered 2016-06-21 – 2016-06-25 (×11): 300 mg via ORAL
  Filled 2016-06-21 (×7): qty 1
  Filled 2016-06-21: qty 3
  Filled 2016-06-21 (×3): qty 1

## 2016-06-21 MED ORDER — BISACODYL 5 MG PO TBEC: 5.0000 mg | DELAYED_RELEASE_TABLET | Freq: Every day | ORAL | Status: DC | PRN

## 2016-06-21 MED ORDER — SODIUM CHLORIDE 0.9 % IV SOLN
INTRAVENOUS | Status: DC
  Administered 2016-06-21: 19:00:00 via INTRAVENOUS
  Filled 2016-06-21: qty 2.5

## 2016-06-21 MED ORDER — SODIUM CHLORIDE 0.9% FLUSH
3.0000 mL | Freq: Two times a day (BID) | INTRAVENOUS | Status: DC
  Administered 2016-06-21: 22:00:00 via INTRAVENOUS
  Administered 2016-06-22 – 2016-06-25 (×4): 3 mL via INTRAVENOUS

## 2016-06-21 MED ORDER — ASPIRIN 81 MG PO CHEW
81.0000 mg | CHEWABLE_TABLET | Freq: Every day | ORAL | Status: DC
  Administered 2016-06-22 – 2016-06-25 (×4): 81 mg via ORAL
  Filled 2016-06-21 (×4): qty 1

## 2016-06-21 MED ORDER — SODIUM CHLORIDE 0.9 % IV SOLN
INTRAVENOUS | Status: DC
  Administered 2016-06-21: 16:00:00 via INTRAVENOUS

## 2016-06-21 MED ORDER — CHLORHEXIDINE GLUCONATE CLOTH 2 % EX PADS
6.0000 | MEDICATED_PAD | Freq: Every day | CUTANEOUS | Status: DC
  Administered 2016-06-22 – 2016-06-24 (×3): 6 via TOPICAL

## 2016-06-21 MED ORDER — PANTOPRAZOLE SODIUM 40 MG PO TBEC
40.0000 mg | DELAYED_RELEASE_TABLET | Freq: Every day | ORAL | Status: DC
  Administered 2016-06-22 – 2016-06-25 (×4): 40 mg via ORAL
  Filled 2016-06-21 (×4): qty 1

## 2016-06-21 NOTE — ED Provider Notes (Signed)
[facility] DEPT Provider Note   CSN: [id number] Arrival date & time: 06/21/16  1204     History   Chief Complaint Chief Complaint  Patient presents with  . Altered Mental Status  . Hyperglycemia    HPI SHAFTER Morgan is a 75 y.o. male.  The history is provided by the patient, a caregiver and a relative. The history is limited by the condition of the patient (AMS).  Altered Mental Status    Hyperglycemia  Associated symptoms: altered mental status   Pt was seen at 1220. Per pt's family: Pt's family states pt's CBG was "400's" last night before pt went to bed and they gave him his lantus. Pt's family states pt fell this morning at 0400. When they went to help him up he "was hallucinating" and "talking about seeing bugs and people that weren't there." Family checked pt's CBG PTA to the ED and it "read high" so they gave him "20 units of lantus" as well as "one of his pain pills."  Pt continues confused on arrival to ED. No reported LOC/syncope, no fevers, no focal motor weakness.   Past Medical History:  Diagnosis Date  . AAA (abdominal aortic aneurysm) (HCC)    a. Abd U/S 7/14: mild aneurysmal dilatation 3x3 cm; cholelithiasis without evid of cholecystitis => repeat 1 year  . Arthritis    stenosis, lumbar region  . CHF (congestive heart failure) (HCC)   . Coronary artery disease   . Diabetes mellitus   . Dyslipidemia   . MI (myocardial infarction) 11/09/2008   2.5 x 23 Xience V DES to the CFX  . Osteoporosis   . Renal insufficiency   . Stroke [facility])     Patient Active Problem List   Diagnosis Date Noted  . Left leg weakness   . Acute CVA (cerebrovascular accident) (HCC) 01/14/2016  . Fall at home 01/13/2016  . Ataxia 01/13/2016  . Orthostatic hypotension 01/13/2016  . Hip pain, acute   . 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 Tremor of both hands 11/18/2015  . HLD (hyperlipidemia) 11/18/2015  . Type 2 diabetes mellitus  with stage 2 chronic kidney disease, with long-term current use of insulin (HCC) 08/15/2015  . S/P carotid endarterectomy 08/15/2015  . Acute on chronic systolic congestive heart failure (HCC) 08/15/2015  . Cardiomyopathy, ischemic 06/18/2015  . Dyslipidemia 06/15/2015  . Cerebral infarction due to unspecified mechanism   . Chronic pain syndrome 06/11/2015  . Adjustment disorder with anxious mood 06/11/2015  . Aphasia S/P CVA 05/28/2015  . Right hemiparesis (HCC) 05/28/2015  . Stroke, acute, embolic (HCC) 05/27/2015  . Carotid stenosis   . Stroke (HCC) 05/24/2015  . Acute on chronic renal failure (HCC) 05/24/2015  . CVA (cerebral infarction) 05/24/2015  . Leukocytosis 05/24/2015  . Foreign body in colon 04/08/2015  . Spinal stenosis of lumbar region 10/18/2014  . Lumbar stenosis with neurogenic claudication 10/18/2014  . Thyroid nodule 03/12/2013  . AAA (abdominal aortic aneurysm) (HCC) 03/12/2013  . PULMONARY NODULE 08/14/2010  . COUGH 06/30/2010  . HYPOTENSION 05/04/2010  . AODM 01/27/2009  . HYPERCHOLESTEROLEMIA  IIA 01/27/2009  . CAD, NATIVE VESSEL 12/26/2008    Past Surgical History:  Procedure Laterality Date  . BACK SURGERY  2015   lumbar fusion  . CORONARY ANGIOPLASTY    . CORONARY STENT PLACEMENT    . ENDARTERECTOMY Left 06/17/2015   Procedure: Left Carotid ENDARTERECTOMY with Livia Snellen Patch;  Surgeon: Chuck Hint, MD;  Location: [facility] OR;  Service: Vascular;  Laterality: Left;  . EP IMPLANTABLE DEVICE N/A 05/26/2015   Procedure: Loop Recorder Insertion;  Surgeon: Marinus Maw, MD;  Location: [facility] INVASIVE CV LAB;  Service: Cardiovascular;  Laterality: N/A;  . 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 JOINT REPLACEMENT Left   . KNEE SURGERY    . LUMBAR LAMINECTOMY/DECOMPRESSION MICRODISCECTOMY N/A 10/18/2014   Procedure: LUMBAR LAMINECTOMY/DECOMPRESSION MICRODISCECTOMYLUMBAR THREE-FOUR ;  Surgeon: Temple Pacini, MD;  Location: [facility] NEURO  ORS;  Service: Neurosurgery;  Laterality: N/A;  . TEE WITHOUT CARDIOVERSION N/A 05/26/2015   Procedure: TRANSESOPHAGEAL ECHOCARDIOGRAM (TEE);  Surgeon: Vesta Mixer, MD;  Location: [facility] ENDOSCOPY;  Service: Cardiovascular;  Laterality: N/A;       Home Medications    Prior to Admission medications   Medication Sig Start Date End Date Taking? Authorizing Provider  aspirin 81 MG tablet Take 1 tablet (81 mg total) by mouth daily. 06/19/15  Yes Shanker Levora Dredge, MD  atorvastatin (LIPITOR) 80 MG tablet Take 1 tablet (80 mg total) by mouth daily. 09/17/15  Yes Antoine Poche, MD  bisacodyl (DULCOLAX) 5 MG EC tablet Take 5 mg by mouth daily as needed for moderate constipation.   Yes Historical Provider, MD  carvedilol (COREG) 3.125 MG tablet Take 1 tablet (3.125 mg total) by mouth 2 (two) times daily with a meal. 06/19/15  Yes Shanker Levora Dredge, MD  clopidogrel (PLAVIX) 75 MG tablet Take 1 tablet (75 mg total) by mouth daily. 06/19/15  Yes Shanker Levora Dredge, MD  cyclobenzaprine (FLEXERIL) 5 MG tablet Take 5 mg by mouth daily as needed for muscle spasms.   Yes Historical Provider, MD  escitalopram (LEXAPRO) 5 MG tablet Take 5 mg by mouth daily.   Yes Historical Provider, MD  gabapentin (NEURONTIN) 100 MG capsule TAKE 3 CAPSULES(300 MG) BY MOUTH THREE TIMES DAILY 09/15/15  Yes Marvel Plan, MD  insulin glargine (LANTUS) 100 UNIT/ML injection Inject 0.4 mLs (40 Units total) into the skin at bedtime. Patient taking differently: Inject 20 Units into the skin at bedtime.  01/16/16  Yes Erick Blinks, MD  Liniments (BLUE-EMU SUPER STRENGTH) CREA Apply topically.   Yes Historical Provider, MD  lisinopril (PRINIVIL,ZESTRIL) 5 MG tablet Take 5 mg by mouth daily.   Yes Historical Provider, MD  metFORMIN (GLUCOPHAGE) 500 MG tablet Take 250 mg by mouth 2 (two) times daily with a meal.   Yes Historical Provider, MD  oxyCODONE-acetaminophen (PERCOCET/ROXICET) 5-325 MG tablet Take 1-2 tablets by mouth every 6 (six) hours  as needed. 04/10/16  Yes Kristen N Ward, DO  pantoprazole (PROTONIX) 40 MG tablet Take 40 mg by mouth daily.   Yes Historical Provider, MD  tamsulosin (FLOMAX) 0.4 MG CAPS capsule Take 1 capsule (0.4 mg total) by mouth daily. 06/19/15  Yes Shanker Levora Dredge, MD  ciprofloxacin (CIPRO) 500 MG tablet Take 1 tablet (500 mg total) by mouth 2 (two) times daily. Patient not taking: Reported on 06/21/2016 03/05/16   Gilda Crease, MD  metroNIDAZOLE (FLAGYL) 500 MG tablet Take 1 tablet (500 mg total) by mouth 3 (three) times daily. Patient not taking: Reported on 06/21/2016 03/05/16   Gilda Crease, MD  mirtazapine (REMERON) 7.5 MG tablet Take 1 tablet (7.5 mg total) by mouth at bedtime. For depression/sleep Patient not taking: Reported on 06/21/2016 06/11/15   Jacquelynn Cree, PA-C    Family History Family History  Problem Relation Age of Onset  .  Heart attack Brother     x2 brothers  . CAD Brother   . Stroke Mother     Social History Social History  Substance Use Topics  . Smoking status: Never Smoker  . Smokeless tobacco: Never Used  . Alcohol use No     Allergies   Trazodone and nefazodone   Review of Systems Review of Systems  Unable to perform ROS: Mental status change     Physical Exam Updated Vital Signs BP (!) 133/101 (BP Location: Left Arm)   Pulse 119   Temp 98.1 F (36.7 C) (Oral)   Resp 22   Ht 5\' 7"  (1.702 m)   Wt 150 lb (68 kg)   SpO2 98%   BMI 23.49 kg/m   Patient Vitals for the past 24 hrs:  BP Temp Temp src Pulse Resp SpO2 Height Weight  06/21/16 1330 105/57 - - 113 19 98 % - -  06/21/16 1300 106/90 - - 116 (!) 28 99 % - -  06/21/16 1245 - - - 118 - 99 % - -  06/21/16 1230 93/56 - - 115 23 99 % - -  06/21/16 1218 (!) 133/101 98.1 F (36.7 C) Oral 119 22 98 % - -  06/21/16 1215 - - - - - - 5\' 7"  (1.702 m) 150 lb (68 kg)      Physical Exam 1225: Physical examination:  Nursing notes reviewed; Vital signs and O2 SAT reviewed;   Constitutional: Well developed, Well nourished, In no acute distress; Head:  Normocephalic, atraumatic; Eyes: EOMI, PERRL, No scleral icterus; ENMT: Mouth and pharynx normal, Mucous membranes dry; Neck: Supple, Full range of motion, No lymphadenopathy; Cardiovascular: Tachycardic rate and rhythm, No gallop; Respiratory: Breath sounds clear & equal bilaterally, No wheezes.  Speaking full sentences with ease, Normal respiratory effort/excursion; Chest: Nontender, Movement normal; Abdomen: Soft, Nontender, Nondistended, Normal bowel sounds; Genitourinary: No CVA tenderness; Spine:  No midline CS, TS, LS tenderness.;; Extremities: Pulses normal, No tenderness, No edema, No calf edema or asymmetry.; Neuro: Awake, alert, confused re: time, place, events. No facial droop. Speech clear. Moves all extremities spontaneously on stretcher without apparent gross focal motor deficits..; Skin: Color normal, Warm, Dry.   ED Treatments / Results  Labs (all labs ordered are listed, but only abnormal results are displayed)   EKG  EKG Interpretation  Date/Time:  Monday June 21 2016 12:58:46 EDT Ventricular Rate:  118 PR Interval:    QRS Duration: 138 QT Interval:  376 QTC Calculation: 527 R Axis:   -76 Text Interpretation:  Sinus tachycardia Multiform ventricular premature complexes Right bundle branch block Left axis deviation Artifact When compared with ECG of 04/10/2016 Premature ventricular complexes are present Confirmed by [facility]  MD, Nicholos Johns [phone]) on 06/21/2016 1:06:27 PM       Radiology   Procedures Procedures (including critical care time)  Medications Ordered in ED Medications  sodium chloride 0.9 % bolus 1,000 mL (not administered)  0.9 %  sodium chloride infusion (not administered)     Initial Impression / Assessment and Plan / ED Course  I have reviewed the triage vital signs and the nursing notes.  Pertinent labs & imaging results that were available during my care of the  patient were reviewed by me and considered in my medical decision making (see chart for details).  MDM Reviewed: previous chart, nursing note and vitals Reviewed previous: labs and ECG Interpretation: labs, ECG, x-ray and CT scan   ED ECG REPORT   Date: 06/21/2016  Rate: 116  Rhythm: sinus tachycardia and premature atrial contractions (PAC)  QRS Axis: left  Intervals: normal  ST/T Wave abnormalities: nonspecific ST/T changes  Conduction Disutrbances:right bundle branch block  Narrative Interpretation: baseline wander, artifact  Old EKG Reviewed: changes noted; compared to EKG 04/10/2016 rate is faster.   Results for orders placed or performed during the hospital encounter of 06/21/16  Comprehensive metabolic panel  Result Value Ref Range   Sodium 131 (L) 135 - 145 mmol/L   Potassium 4.4 3.5 - 5.1 mmol/L   Chloride 96 (L) 101 - 111 mmol/L   CO2 22 22 - 32 mmol/L   Glucose, Bld 584 (HH) 65 - 99 mg/dL   BUN 33 (H) 6 - 20 mg/dL   Creatinine, Ser 1.61 (H) 0.61 - 1.24 mg/dL   Calcium 9.2 8.9 - 09.6 mg/dL   Total Protein 6.9 6.5 - 8.1 g/dL   Albumin 3.9 3.5 - 5.0 g/dL   AST 28 15 - 41 U/L   ALT 18 17 - 63 U/L   Alkaline Phosphatase 79 38 - 126 U/L   Total Bilirubin 1.7 (H) 0.3 - 1.2 mg/dL   GFR calc non Af Amer 34 (L) >60 mL/min   GFR calc Af Amer 39 (L) >60 mL/min   Anion gap 13 5 - 15  CBC with Differential  Result Value Ref Range   WBC 14.1 (H) 4.0 - 10.5 K/uL   RBC 4.29 4.22 - 5.81 MIL/uL   Hemoglobin 14.0 13.0 - 17.0 g/dL   HCT 04.5 40.9 - 81.1 %   MCV 92.1 78.0 - 100.0 fL   MCH 32.6 26.0 - 34.0 pg   MCHC 35.4 30.0 - 36.0 g/dL   RDW 91.4 78.2 - 95.6 %   Platelets 194 150 - 400 K/uL   Neutrophils Relative % 78 %   Neutro Abs 11.0 (H) 1.7 - 7.7 K/uL   Lymphocytes Relative 11 %   Lymphs Abs 1.5 0.7 - 4.0 K/uL   Monocytes Relative 11 %   Monocytes Absolute 1.5 (H) 0.1 - 1.0 K/uL   Eosinophils Relative 0 %   Eosinophils Absolute 0.0 0.0 - 0.7 K/uL   Basophils  Relative 0 %   Basophils Absolute 0.0 0.0 - 0.1 K/uL  Troponin I  Result Value Ref Range   Troponin I 0.06 (HH) <0.03 ng/mL  Urinalysis, Routine w reflex microscopic  Result Value Ref Range   Color, Urine YELLOW YELLOW   APPearance CLEAR CLEAR   Specific Gravity, Urine 1.010 1.005 - 1.030   pH 5.5 5.0 - 8.0   Glucose, UA >1000 (A) NEGATIVE mg/dL   Hgb urine dipstick MODERATE (A) NEGATIVE   Bilirubin Urine NEGATIVE NEGATIVE   Ketones, ur 15 (A) NEGATIVE mg/dL   Protein, ur NEGATIVE NEGATIVE mg/dL   Nitrite NEGATIVE NEGATIVE   Leukocytes, UA NEGATIVE NEGATIVE  Urine rapid drug screen (hosp performed)  Result Value Ref Range   Opiates POSITIVE (A) NONE DETECTED   Cocaine NONE DETECTED NONE DETECTED   Benzodiazepines NONE DETECTED NONE DETECTED   Amphetamines NONE DETECTED NONE DETECTED   Tetrahydrocannabinol NONE DETECTED NONE DETECTED   Barbiturates NONE DETECTED NONE DETECTED  Acetaminophen level  Result Value Ref Range   Acetaminophen (Tylenol), Serum <10 (L) 10 - 30 ug/mL  Ethanol  Result Value Ref Range   Alcohol, Ethyl (B) <5 <5 mg/dL  Salicylate level  Result Value Ref Range   Salicylate Lvl <7.0 2.8 - 30.0 mg/dL  Magnesium  Result Value Ref Range  Magnesium 1.1 (L) 1.7 - 2.4 mg/dL  Urine microscopic-add on  Result Value Ref Range   Squamous Epithelial / LPF 0-5 (A) NONE SEEN   WBC, UA 0-5 0 - 5 WBC/hpf   RBC / HPF 0-5 0 - 5 RBC/hpf   Bacteria, UA MANY (A) NONE SEEN  CBG monitoring, ED  Result Value Ref Range   Glucose-Capillary 583 (HH) 65 - 99 mg/dL   Comment 1 Notify RN   CBG monitoring, ED  Result Value Ref Range   Glucose-Capillary 491 (H) 65 - 99 mg/dL   Comment 1 Notify RN    Dg Chest 2 View Result Date: 06/21/2016 CLINICAL DATA:  Altered mental status.  The patient fell today. EXAM: CHEST  2 VIEW COMPARISON:  04/09/2016 FINDINGS: The heart size and mediastinal contours are within normal limits. Chronic accentuation of the interstitial markings. No  acute infiltrates or effusions. No acute bone abnormality. IMPRESSION: No acute abnormality.  Chronic interstitial disease. Electronically Signed   By: Francene BoyersJames  Maxwell M.D.   On: 06/21/2016 13:01   Ct Head Wo Contrast Result Date: 06/21/2016 CLINICAL DATA:  Altered mental status. Fall. Posterior head injury. EXAM: CT HEAD WITHOUT CONTRAST TECHNIQUE: Contiguous axial images were obtained from the base of the skull through the vertex without intravenous contrast. COMPARISON:  01/13/2016 head CT. FINDINGS: Brain: No evidence of parenchymal hemorrhage or extra-axial fluid collection. No mass lesion, mass effect, or midline shift. No CT evidence of acute infarction. Stable encephalomalacia in the left frontal and left occipital lobes. Intracranial atherosclerosis. Nonspecific mild to moderate subcortical and periventricular white matter hypodensity, most in keeping with chronic small vessel ischemic change. Underlying generalized cerebral volume loss. Cerebral ventricle sizes are stable and concordant with the degree of cerebral volume loss. Vascular: No hyperdense vessel or unexpected calcification. Skull: No evidence of calvarial fracture. Sinuses/Orbits: The visualized paranasal sinuses are essentially clear. Stable chronic atrophy and calcification of the left globe. Other:  The mastoid air cells are unopacified. IMPRESSION: 1. No evidence of acute intracranial abnormality. No evidence of calvarial fracture. 2. Stable left frontal and left occipital lobe encephalomalacia. 3. Mild-to-moderate chronic small vessel ischemia. Electronically Signed   By: Delbert PhenixJason A Poff M.D.   On: 06/21/2016 13:08    Results for Krystal EatonLYNSKEY, Avir C (MRN [id number]) as of 06/21/2016 14:27  Ref. Range 01/14/2016 05:49 01/15/2016 05:56 03/04/2016 20:51 03/08/2016 12:33 04/09/2016 21:53 06/21/2016 12:18  BUN Latest Ref Range: 6 - 20 mg/dL 17 18 25  (H) 23 (H) 35 (H) 33 (H)  Creatinine Latest Ref Range: 0.61 - 1.24 mg/dL 4.091.18 8.111.28 (H) 9.141.36 (H) 1.44 (H)  1.58 (H) 1.88 (H)    1535:  BP labile, CBG elevated; IVF bolus given as well as IV insulin drip started.  Magnesium repleted IV. Possible UTI, UC is pending; IV rocephin ordered. Pt requesting his "pain pills," (pt takes percocet QID). Will give small dose of IV fentanyl. T/C to [facility]. Ophelia CharterYates, case discussed, including:  HPI, pertinent PM/SHx, VS/PE, dx testing, ED course and treatment:  Agreeable to admit, requests to write temporary orders, obtain stepdown bed to team APAdmits.   Final Clinical Impressions(s) / ED Diagnoses   Final diagnoses:  None    New Prescriptions New Prescriptions   No medications on file     Samuel JesterKathleen Deborah Dondero, DO 06/24/16 [id number]

## 2016-06-21 NOTE — H&P (Addendum)
History and Physical    Lucas Morgan ZOX:[id number] DOB: [birth year] DOA: 06/21/2016  PCP: [facility] Consultants:  ? Patient coming from: home  Chief Complaint: AMS  HPI: Lucas Morgan is a 75 y.o. male with medical history significant of *CVA, dementia, CKD, DM, CAD, CHF presenting with AMS.  At the time of my evaluation, he was alone in the room and unable to provide appropriate history.    HPI as per Dr. Clarene Duke: Pt was seen at 1220. Per pt's family: Pt's family states pt's CBG was "400's" last night before pt went to bed and they gave him his lantus. Pt's family states pt fell this morning at 0400. When they went to help him up he "was hallucinating" and "talking about seeing bugs and people that weren't there." Family checked pt's CBG PTA to the ED and it "read high" so they gave him "20 units of lantus" as well as "one of his pain pills."  Pt continues confused on arrival to ED. No reported LOC/syncope, no fevers, no focal motor weakness.     ED Course: Per Dr. Clarene Duke: 1535:  BP labile, CBG elevated; IVF bolus given as well as IV insulin drip started.  Magnesium repleted IV. Possible UTI, UC is pending; IV rocephin ordered. Pt requesting his "pain pills," (pt takes percocet QID). Will give small dose of IV fentanyl. T/C to [facility]. Ophelia Charter, case discussed, including:  HPI, pertinent PM/SHx, VS/PE, dx testing, ED course and treatment:  Agreeable to admit, requests to write temporary orders, obtain stepdown bed to team APAdmits.  Review of Systems: Unable to obtain; denies pain    Past Medical History:  Diagnosis Date  . AAA (abdominal aortic aneurysm) (HCC)    a. Abd U/S 7/14: mild aneurysmal dilatation 3x3 cm; cholelithiasis without evid of cholecystitis => repeat 1 year  . Arthritis    stenosis, lumbar region  . CHF (congestive heart failure) (HCC)   . Coronary artery disease   . Diabetes mellitus   . Dyslipidemia   . MI (myocardial infarction)  11/09/2008   2.5 x 23 Xience V DES to the CFX  . Osteoporosis   . Renal insufficiency   . Stroke [facility])     Past Surgical History:  Procedure Laterality Date  . BACK SURGERY  2015   lumbar fusion  . CORONARY ANGIOPLASTY    . CORONARY STENT PLACEMENT    . ENDARTERECTOMY Left 06/17/2015   Procedure: Left Carotid ENDARTERECTOMY with Livia Snellen Patch;  Surgeon: Chuck Hint, MD;  Location: [facility] OR;  Service: Vascular;  Laterality: Left;  . EP IMPLANTABLE DEVICE N/A 05/26/2015   Procedure: Loop Recorder Insertion;  Surgeon: Marinus Maw, MD;  Location: [facility] INVASIVE CV LAB;  Service: Cardiovascular;  Laterality: N/A;  . 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 JOINT REPLACEMENT Left   . KNEE SURGERY    . LUMBAR LAMINECTOMY/DECOMPRESSION MICRODISCECTOMY N/A 10/18/2014   Procedure: LUMBAR LAMINECTOMY/DECOMPRESSION MICRODISCECTOMYLUMBAR THREE-FOUR ;  Surgeon: Temple Pacini, MD;  Location: [facility] NEURO ORS;  Service: Neurosurgery;  Laterality: N/A;  . TEE WITHOUT CARDIOVERSION N/A 05/26/2015   Procedure: TRANSESOPHAGEAL ECHOCARDIOGRAM (TEE);  Surgeon: Vesta Mixer, MD;  Location: [facility] ENDOSCOPY;  Service: Cardiovascular;  Laterality: N/A;    Social History   Social History  . Marital status: Widowed    Spouse name: N/A  . Number of children: N/A  . Years of education: N/A  Occupational History  . Retired    Social History Main Topics  . Smoking status: Never Smoker  . Smokeless tobacco: Never Used  . Alcohol use No  . Drug use: No  . Sexual activity: Not on file   Other Topics Concern  . Not on file   Social History Narrative   The patient lives in [place] with the family. He denies any tobacco use.    Allergies  Allergen Reactions  . Trazodone And Nefazodone Other (See Comments)    High blood sugar    Family History  Problem Relation Age of Onset  . Heart attack Brother     x2 brothers  . CAD Brother   . Stroke Mother      Prior to Admission medications   Medication Sig Start Date End Date Taking? Authorizing Provider  aspirin 81 MG tablet Take 1 tablet (81 mg total) by mouth daily. 06/19/15  Yes Shanker Levora Dredge, MD  atorvastatin (LIPITOR) 80 MG tablet Take 1 tablet (80 mg total) by mouth daily. 09/17/15  Yes Antoine Poche, MD  bisacodyl (DULCOLAX) 5 MG EC tablet Take 5 mg by mouth daily as needed for moderate constipation.   Yes Historical Provider, MD  carvedilol (COREG) 3.125 MG tablet Take 1 tablet (3.125 mg total) by mouth 2 (two) times daily with a meal. 06/19/15  Yes Shanker Levora Dredge, MD  clopidogrel (PLAVIX) 75 MG tablet Take 1 tablet (75 mg total) by mouth daily. 06/19/15  Yes Shanker Levora Dredge, MD  cyclobenzaprine (FLEXERIL) 5 MG tablet Take 5 mg by mouth daily as needed for muscle spasms.   Yes Historical Provider, MD  escitalopram (LEXAPRO) 5 MG tablet Take 5 mg by mouth daily.   Yes Historical Provider, MD  gabapentin (NEURONTIN) 100 MG capsule TAKE 3 CAPSULES(300 MG) BY MOUTH THREE TIMES DAILY 09/15/15  Yes Marvel Plan, MD  insulin glargine (LANTUS) 100 UNIT/ML injection Inject 0.4 mLs (40 Units total) into the skin at bedtime. Patient taking differently: Inject 20 Units into the skin at bedtime.  01/16/16  Yes Erick Blinks, MD  Liniments (BLUE-EMU SUPER STRENGTH) CREA Apply topically.   Yes Historical Provider, MD  lisinopril (PRINIVIL,ZESTRIL) 5 MG tablet Take 5 mg by mouth daily.   Yes Historical Provider, MD  metFORMIN (GLUCOPHAGE) 500 MG tablet Take 250 mg by mouth 2 (two) times daily with a meal.   Yes Historical Provider, MD  oxyCODONE-acetaminophen (PERCOCET/ROXICET) 5-325 MG tablet Take 1-2 tablets by mouth every 6 (six) hours as needed. 04/10/16  Yes Kristen N Ward, DO  pantoprazole (PROTONIX) 40 MG tablet Take 40 mg by mouth daily.   Yes Historical Provider, MD  tamsulosin (FLOMAX) 0.4 MG CAPS capsule Take 1 capsule (0.4 mg total) by mouth daily. 06/19/15  Yes Shanker Levora Dredge, MD   ciprofloxacin (CIPRO) 500 MG tablet Take 1 tablet (500 mg total) by mouth 2 (two) times daily. Patient not taking: Reported on 06/21/2016 03/05/16   Gilda Crease, MD  metroNIDAZOLE (FLAGYL) 500 MG tablet Take 1 tablet (500 mg total) by mouth 3 (three) times daily. Patient not taking: Reported on 06/21/2016 03/05/16   Gilda Crease, MD  mirtazapine (REMERON) 7.5 MG tablet Take 1 tablet (7.5 mg total) by mouth at bedtime. For depression/sleep Patient not taking: Reported on 06/21/2016 06/11/15   Jacquelynn Cree, PA-C    Physical Exam: Vitals:   06/21/16 1700 06/21/16 1943 06/21/16 2000 06/21/16 2100  BP:    (!) 145/113  Pulse: Marland Kitchen)  122  (!) 123 (!) 116  Resp: (!) 23  17 (!) 22  Temp:   98.6 F (37 C)   TempSrc:   Oral   SpO2: 98% 98% 97% 99%  Weight:      Height:         General: Curled up in a ball, minimally interactive Eyes:  EOMI, normal lids, iris ENT:  grossly normal hearing, lips & tongue, mmm Neck:  no LAD, masses or thyromegaly Cardiovascular:  Tachycardia, no m/r/g. No LE edema.  Respiratory:  CTA bilaterally, no w/r/r. Normal respiratory effort. Abdomen:  soft, ntnd, NABS Skin:  no rash or induration seen on limited exam Musculoskeletal:  grossly normal tone BUE/BLE, good ROM, no bony abnormality Psychiatric:  Oriented to person, place, year.  Otherwise, minimally interactive. Neurologic:  Unable to assess  Labs on Admission: I have personally reviewed following labs and imaging studies  CBC:  Recent Labs Lab 06/21/16 1224  WBC 14.1*  NEUTROABS 11.0*  HGB 14.0  HCT 39.5  MCV 92.1  PLT 194   Basic Metabolic Panel:  Recent Labs Lab 06/21/16 1218 06/21/16 1235 06/21/16 1953  NA 131*  --  136  K 4.4  --  3.2*  CL 96*  --  104  CO2 22  --  22  GLUCOSE 584*  --  146*  BUN 33*  --  27*  CREATININE 1.88*  --  1.36*  CALCIUM 9.2  --  8.6*  MG  --  1.1*  --    GFR: Estimated Creatinine Clearance: 45.8 mL/min (by C-G formula based on SCr of  1.36 mg/dL (H)). Liver Function Tests:  Recent Labs Lab 06/21/16 1218  AST 28  ALT 18  ALKPHOS 79  BILITOT 1.7*  PROT 6.9  ALBUMIN 3.9   No results for input(s): LIPASE, AMYLASE in the last 168 hours. No results for input(s): AMMONIA in the last 168 hours. Coagulation Profile: No results for input(s): INR, PROTIME in the last 168 hours. Cardiac Enzymes:  Recent Labs Lab 06/21/16 1224 06/21/16 1953  TROPONINI 0.06* 0.10*   BNP (last 3 results) No results for input(s): PROBNP in the last 8760 hours. HbA1C: No results for input(s): HGBA1C in the last 72 hours. CBG:  Recent Labs Lab 06/21/16 1658 06/21/16 1759 06/21/16 1853 06/21/16 2001 06/21/16 2103  GLUCAP 289* 253* 196* 145* 156*   Lipid Profile: No results for input(s): CHOL, HDL, LDLCALC, TRIG, CHOLHDL, LDLDIRECT in the last 72 hours. Thyroid Function Tests: No results for input(s): TSH, T4TOTAL, FREET4, T3FREE, THYROIDAB in the last 72 hours. Anemia Panel: No results for input(s): VITAMINB12, FOLATE, FERRITIN, TIBC, IRON, RETICCTPCT in the last 72 hours. Urine analysis:    Component Value Date/Time   COLORURINE YELLOW 06/21/2016 1225   APPEARANCEUR CLEAR 06/21/2016 1225   LABSPEC 1.010 06/21/2016 1225   PHURINE 5.5 06/21/2016 1225   GLUCOSEU >1000 (A) 06/21/2016 1225   HGBUR MODERATE (A) 06/21/2016 1225   BILIRUBINUR NEGATIVE 06/21/2016 1225   KETONESUR 15 (A) 06/21/2016 1225   PROTEINUR NEGATIVE 06/21/2016 1225   UROBILINOGEN 1.0 05/30/2015 1022   NITRITE NEGATIVE 06/21/2016 1225   LEUKOCYTESUR NEGATIVE 06/21/2016 1225    Creatinine Clearance: Estimated Creatinine Clearance: 45.8 mL/min (by C-G formula based on SCr of 1.36 mg/dL (H)).  Sepsis Labs: @LABRCNTIP (procalcitonin:4,lacticidven:4) )No results found for this or any previous visit (from the past 240 hour(s)).   Radiological Exams on Admission: Dg Chest 2 View  Result Date: 06/21/2016 CLINICAL DATA:  Altered mental status.  The  patient fell today. EXAM: CHEST  2 VIEW COMPARISON:  04/09/2016 FINDINGS: The heart size and mediastinal contours are within normal limits. Chronic accentuation of the interstitial markings. No acute infiltrates or effusions. No acute bone abnormality. IMPRESSION: No acute abnormality.  Chronic interstitial disease. Electronically Signed   By: Francene Boyers M.D.   On: 06/21/2016 13:01   Ct Head Wo Contrast  Result Date: 06/21/2016 CLINICAL DATA:  Altered mental status. Fall. Posterior head injury. EXAM: CT HEAD WITHOUT CONTRAST TECHNIQUE: Contiguous axial images were obtained from the base of the skull through the vertex without intravenous contrast. COMPARISON:  01/13/2016 head CT. FINDINGS: Brain: No evidence of parenchymal hemorrhage or extra-axial fluid collection. No mass lesion, mass effect, or midline shift. No CT evidence of acute infarction. Stable encephalomalacia in the left frontal and left occipital lobes. Intracranial atherosclerosis. Nonspecific mild to moderate subcortical and periventricular white matter hypodensity, most in keeping with chronic small vessel ischemic change. Underlying generalized cerebral volume loss. Cerebral ventricle sizes are stable and concordant with the degree of cerebral volume loss. Vascular: No hyperdense vessel or unexpected calcification. Skull: No evidence of calvarial fracture. Sinuses/Orbits: The visualized paranasal sinuses are essentially clear. Stable chronic atrophy and calcification of the left globe. Other:  The mastoid air cells are unopacified. IMPRESSION: 1. No evidence of acute intracranial abnormality. No evidence of calvarial fracture. 2. Stable left frontal and left occipital lobe encephalomalacia. 3. Mild-to-moderate chronic small vessel ischemia. Electronically Signed   By: Delbert Phenix M.D.   On: 06/21/2016 13:08    EKG: Independently reviewed.  Sinus tachycardia with rate 118; nonspecific ST changes with no evidence of acute ischemia, +RBBB,  +PVCs  Assessment/Plan Principal Problem:   Altered mental status Active Problems:   Acute on chronic renal failure (HCC)   Chronic pain syndrome   Type 2 diabetes mellitus with stage 2 chronic kidney disease, with long-term current use of insulin (HCC)   HLD (hyperlipidemia)   Hypomagnesemia   Elevated troponin   AMS/Acute encephalopathy -Uncertain etiology -Patient with apparent acute/subacute onset of hallucinations, delirium -Initial thought by ED physician was that hyperosmolar hyperglycemic syndrome was most likely etiology and so she started Glucostabilizer protocol -UTI was also a consideration and so he was given Rocephin; UCx is pending, but UA appears negative -UDS positive for opiates but family acknowledges giving him his pain medication; medication/drugs are a possible culprit -Other acute medical problems are improving but mental status remains altered -Head CT was negative; would consider MRI and/or neuro consult if patient does not clear his sensorium by tomorrow  Acute kidney injury on CKD -Improving with IVF -Continue to follow  DM, HHS -Patient markedly hyperglycemic on admission but normal gap and no acidosis -Patient was started on the Glucostabilizer protocol -His glucose has normalized and I have written transition orders (as per order set) - will give Levemir (Lantus is not part of order set) and allow patient to eat and then turn off insulin drip 1-2 hours later -Based on rapid improvement in glucose without impact on sensorium, it seems unlikely that this was the cause of his encephalopathy -Will admit to SDU for close ongoing monitoring  Elevated troponin -May be related to AKI, but as creatinine is improving, troponin is mildly increasing -Patient without c/o chest pain, but not sure how reliable this is -EKG is negative for acute change -Will continue to trend troponin and monitor on telemetry in SDU -If ongoing troponin elevation, will need to  consider cardiology consultation -  either in AM or tonight  Hypomagnesium -Repleted in ER  Chronic pain -I attempted to review this patient in the [place] Controlled Substances Reporting System.  I was unable to locate him.  DVT prophylaxis: Lovenox  Code Status:  Full - as per prior hospitalization Family Communication: None present  Disposition Plan: To be determined Consults called: None Admission status: Admit - It is my clinical opinion that admission to INPATIENT is reasonable and necessary because this patient will require at least 2 midnights in the hospital to treat this condition based on the medical complexity of the problems presented.  Given the aforementioned information, the predictability of an adverse outcome is felt to be significant.    Jonah Blue MD [facility]  If 7PM-7AM, please contact night-coverage [email or web address] Password [email or web address]  06/21/2016, 10:08 PM

## 2016-06-21 NOTE — Progress Notes (Signed)
[employer] Summary Report / Loop Recorder 

## 2016-06-21 NOTE — ED Notes (Signed)
CRITICAL VALUE ALERT  Critical value received:  Glucose 584  Date of notification:  06/21/16  Time of notification:  1334  Critical value read back:Yes.    Nurse who received alert:  c Kelina Beauchamp rn  MD notified (1st page):    Time of first page:    MD notified (2nd page):  Time of second page:  Responding MD:  Dr Clarene Dukemcmanus  Time MD responded:  [phone]

## 2016-06-21 NOTE — ED Notes (Signed)
Pt very restless. Unable to obtain bp due to tenseness. Pt ususally takes pain pills x 4 a day. Pt keeps repeated "pain pill". edp aware.

## 2016-06-21 NOTE — ED Notes (Signed)
CRITICAL VALUE ALERT  Critical value received:  Troponin 0.06  Date of notification:  06/21/2016 Time of notification:  1345  Critical value read back:Yes.    Nurse who received alert:  [facility] RN MD notified (1st page):  Dr. Clarene DukeMcmanus  Time of first page:  1345  MD notified (2nd page):  Time of second page:  Responding MD:  Dr. Clarene DukeMcmanus  Time MD responded:  [phone]

## 2016-06-21 NOTE — ED Notes (Signed)
lnbm this am per daughter

## 2016-06-21 NOTE — ED Triage Notes (Signed)
Pt family reports fall this morning around 0400, pt hit posterior head without LOC. Pt has bruising to area.  Pt has also been altered mentally and has been having hallucinations, seeing bugs and people that are not there.  Pt is diabetic, meter read "HIGH", pt takes daily medication for diabetes.  Pt altered at this time.

## 2016-06-21 NOTE — Progress Notes (Signed)
Pharmacy Antibiotic Note  Krystal EatonJohn C Ogden is a 75 y.o. male admitted on 06/21/2016 with UTI.  Pharmacy has been consulted for Rocephin dosing.  Plan: Rocephin 1gm IV every 24 hours. Monitor labs, micro and vitals.   Height: 5\' 8"  (172.7 cm) Weight: 149 lb 11.1 oz (67.9 kg) IBW/kg (Calculated) : 68.4  Temp (24hrs), Avg:98.3 F (36.8 C), Min:98.1 F (36.7 C), Max:98.7 F (37.1 C)   Recent Labs Lab 06/21/16 1218 06/21/16 1224  WBC  --  14.1*  CREATININE 1.88*  --     Estimated Creatinine Clearance: 33.1 mL/min (by C-G formula based on SCr of 1.88 mg/dL (H)).    Allergies  Allergen Reactions  . Trazodone And Nefazodone Other (See Comments)    High blood sugar    Antimicrobials this admission: Rocephin 10/9 >>   Dose adjustments this admission: n/a  Microbiology results: 10/9 UCx: pending   Thank you for allowing pharmacy to be a part of this patient's care.  Mady GemmaHayes, Dasan Hardman R 06/21/2016 7:45 PM

## 2016-06-21 NOTE — ED Notes (Signed)
Pt showing runs of v tach on monitor. Hr 115. 2nd ekg obtained and given to dr mcmanus. No other changes

## 2016-06-22 ENCOUNTER — Observation Stay (HOSPITAL_BASED_OUTPATIENT_CLINIC_OR_DEPARTMENT_OTHER): Payer: Medicare Other

## 2016-06-22 ENCOUNTER — Observation Stay (HOSPITAL_COMMUNITY): Payer: Medicare Other

## 2016-06-22 DIAGNOSIS — R079 Chest pain, unspecified: Secondary | ICD-10-CM

## 2016-06-22 DIAGNOSIS — N181 Chronic kidney disease, stage 1: Secondary | ICD-10-CM

## 2016-06-22 DIAGNOSIS — N179 Acute kidney failure, unspecified: Secondary | ICD-10-CM | POA: Diagnosis present

## 2016-06-22 DIAGNOSIS — R404 Transient alteration of awareness: Secondary | ICD-10-CM

## 2016-06-22 DIAGNOSIS — R4182 Altered mental status, unspecified: Secondary | ICD-10-CM | POA: Diagnosis not present

## 2016-06-22 DIAGNOSIS — N170 Acute kidney failure with tubular necrosis: Secondary | ICD-10-CM | POA: Diagnosis not present

## 2016-06-22 LAB — CBC
HCT: 36.4 % — ABNORMAL LOW (ref 39.0–52.0)
HEMOGLOBIN: 13 g/dL (ref 13.0–17.0)
MCH: 32.4 pg (ref 26.0–34.0)
MCHC: 35.7 g/dL (ref 30.0–36.0)
MCV: 90.8 fL (ref 78.0–100.0)
Platelets: 207 10*3/uL (ref 150–400)
RBC: 4.01 MIL/uL — AB (ref 4.22–5.81)
RDW: 12.6 % (ref 11.5–15.5)
WBC: 15.8 10*3/uL — ABNORMAL HIGH (ref 4.0–10.5)

## 2016-06-22 LAB — HEPATIC FUNCTION PANEL
ALBUMIN: 3.6 g/dL (ref 3.5–5.0)
ALK PHOS: 66 U/L (ref 38–126)
ALT: 20 U/L (ref 17–63)
AST: 38 U/L (ref 15–41)
BILIRUBIN TOTAL: 1 mg/dL (ref 0.3–1.2)
Bilirubin, Direct: 0.1 mg/dL (ref 0.1–0.5)
Indirect Bilirubin: 0.9 mg/dL (ref 0.3–0.9)
TOTAL PROTEIN: 6.9 g/dL (ref 6.5–8.1)

## 2016-06-22 LAB — BASIC METABOLIC PANEL
ANION GAP: 6 (ref 5–15)
BUN: 27 mg/dL — ABNORMAL HIGH (ref 6–20)
CHLORIDE: 107 mmol/L (ref 101–111)
CO2: 22 mmol/L (ref 22–32)
Calcium: 8.7 mg/dL — ABNORMAL LOW (ref 8.9–10.3)
Creatinine, Ser: 1.33 mg/dL — ABNORMAL HIGH (ref 0.61–1.24)
GFR calc non Af Amer: 51 mL/min — ABNORMAL LOW (ref >60)
GFR, EST AFRICAN AMERICAN: 59 mL/min — AB (ref >60)
Glucose, Bld: 103 mg/dL — ABNORMAL HIGH (ref 65–99)
Potassium: 3.3 mmol/L — ABNORMAL LOW (ref 3.5–5.1)
Sodium: 135 mmol/L (ref 135–145)

## 2016-06-22 LAB — GLUCOSE, CAPILLARY
GLUCOSE-CAPILLARY: 106 mg/dL — AB (ref 65–99)
GLUCOSE-CAPILLARY: 183 mg/dL — AB (ref 65–99)
GLUCOSE-CAPILLARY: 215 mg/dL — AB (ref 65–99)
GLUCOSE-CAPILLARY: 73 mg/dL (ref 65–99)
Glucose-Capillary: 60 mg/dL — ABNORMAL LOW (ref 65–99)
Glucose-Capillary: 109 mg/dL — ABNORMAL HIGH (ref 65–99)
Glucose-Capillary: 303 mg/dL — ABNORMAL HIGH (ref 65–99)
Glucose-Capillary: 235 mg/dL — ABNORMAL HIGH (ref 65–99)
Glucose-Capillary: 107 mg/dL — ABNORMAL HIGH (ref 65–99)

## 2016-06-22 LAB — PROTIME-INR
INR: 0.95
PROTHROMBIN TIME: 12.7 s (ref 11.4–15.2)

## 2016-06-22 LAB — TROPONIN I
TROPONIN I: 0.23 ng/mL — AB (ref <0.03)
TROPONIN I: 0.14 ng/mL — AB (ref <0.03)
Troponin I: 0.24 ng/mL (ref <0.03)
Troponin I: 0.16 ng/mL (ref <0.03)

## 2016-06-22 LAB — PROCALCITONIN: Procalcitonin: 53.03 ng/mL

## 2016-06-22 LAB — LACTIC ACID, PLASMA
Lactic Acid, Venous: 2.8 mmol/L (ref 0.5–1.9)
Lactic Acid, Venous: 1.9 mmol/L (ref 0.5–1.9)

## 2016-06-22 LAB — CORTISOL: Cortisol, Plasma: 28.9 ug/dL

## 2016-06-22 LAB — APTT: APTT: 31 s (ref 24–36)

## 2016-06-22 MED ORDER — ENSURE ENLIVE PO LIQD
237.0000 mL | Freq: Two times a day (BID) | ORAL | Status: DC
  Administered 2016-06-23 – 2016-06-25 (×5): 237 mL via ORAL

## 2016-06-22 MED ORDER — INSULIN DETEMIR 100 UNIT/ML ~~LOC~~ SOLN
10.0000 [IU] | Freq: Once | SUBCUTANEOUS | Status: AC
  Administered 2016-06-22: 10 [IU] via SUBCUTANEOUS
  Filled 2016-06-22: qty 0.1

## 2016-06-22 MED ORDER — VANCOMYCIN HCL IN DEXTROSE 1-5 GM/200ML-% IV SOLN
1000.0000 mg | Freq: Once | INTRAVENOUS | Status: AC
  Administered 2016-06-22: 1000 mg via INTRAVENOUS
  Filled 2016-06-22: qty 200

## 2016-06-22 MED ORDER — PIPERACILLIN-TAZOBACTAM 3.375 G IVPB 30 MIN
3.3750 g | Freq: Once | INTRAVENOUS | Status: DC
  Filled 2016-06-22: qty 50

## 2016-06-22 MED ORDER — SODIUM CHLORIDE 0.9 % IV BOLUS (SEPSIS)
250.0000 mL | Freq: Once | INTRAVENOUS | Status: AC
  Administered 2016-06-22: 250 mL via INTRAVENOUS

## 2016-06-22 MED ORDER — DEXTROSE 50 % IV SOLN
25.0000 mL | Freq: Once | INTRAVENOUS | Status: AC
  Administered 2016-06-22: 25 mL via INTRAVENOUS

## 2016-06-22 MED ORDER — DEXTROSE 50 % IV SOLN
INTRAVENOUS | Status: AC
  Filled 2016-06-22: qty 50

## 2016-06-22 MED ORDER — VANCOMYCIN HCL 500 MG IV SOLR
500.0000 mg | Freq: Two times a day (BID) | INTRAVENOUS | Status: DC
  Administered 2016-06-23 – 2016-06-25 (×5): 500 mg via INTRAVENOUS
  Filled 2016-06-22 (×12): qty 500

## 2016-06-22 MED ORDER — PIPERACILLIN-TAZOBACTAM 3.375 G IVPB
3.3750 g | Freq: Once | INTRAVENOUS | Status: AC
  Administered 2016-06-22: 3.375 g via INTRAVENOUS
  Filled 2016-06-22: qty 50

## 2016-06-22 MED ORDER — SODIUM CHLORIDE 0.9 % IV BOLUS (SEPSIS)
500.0000 mL | Freq: Once | INTRAVENOUS | Status: AC
  Administered 2016-06-22: 500 mL via INTRAVENOUS

## 2016-06-22 MED ORDER — ASPIRIN 81 MG PO CHEW
324.0000 mg | CHEWABLE_TABLET | Freq: Once | ORAL | Status: AC
  Administered 2016-06-22: 324 mg via ORAL
  Filled 2016-06-22: qty 4

## 2016-06-22 MED ORDER — PIPERACILLIN-TAZOBACTAM 3.375 G IVPB
3.3750 g | Freq: Three times a day (TID) | INTRAVENOUS | Status: DC
  Administered 2016-06-22 – 2016-06-24 (×5): 3.375 g via INTRAVENOUS
  Filled 2016-06-22 (×5): qty 50

## 2016-06-22 MED ORDER — INFLUENZA VAC SPLIT QUAD 0.5 ML IM SUSY
0.5000 mL | PREFILLED_SYRINGE | INTRAMUSCULAR | Status: AC
  Administered 2016-06-23: 0.5 mL via INTRAMUSCULAR
  Filled 2016-06-22: qty 0.5

## 2016-06-22 MED ORDER — PNEUMOCOCCAL VAC POLYVALENT 25 MCG/0.5ML IJ INJ
0.5000 mL | INJECTION | INTRAMUSCULAR | Status: AC
Start: 2016-06-23 — End: 2016-06-23
  Administered 2016-06-23: 0.5 mL via INTRAMUSCULAR
  Filled 2016-06-22: qty 0.5

## 2016-06-22 MED ORDER — SODIUM CHLORIDE 0.9 % IV SOLN
INTRAVENOUS | Status: DC
  Administered 2016-06-22: 18:00:00 via INTRAVENOUS

## 2016-06-22 MED ORDER — INSULIN ASPART 100 UNIT/ML ~~LOC~~ SOLN
0.0000 [IU] | SUBCUTANEOUS | Status: DC
  Administered 2016-06-22: 8 [IU] via SUBCUTANEOUS
  Administered 2016-06-22: 11 [IU] via SUBCUTANEOUS

## 2016-06-22 NOTE — Progress Notes (Signed)
Inpatient Diabetes Program Recommendations  AACE/ADA: New Consensus Statement on Inpatient Glycemic Control (2015)  Target Ranges:  Prepandial:   less than 140 mg/dL      Peak postprandial:   less than 180 mg/dL (1-2 hours)      Critically ill patients:  140 - 180 mg/dL  Results for Lucas Morgan, [name] (MRN [id number]) as of 06/22/2016 10:02  Ref. Range 06/22/2016 00:08 06/22/2016 04:13 06/22/2016 07:22 06/22/2016 08:02  Glucose-Capillary Latest Ref Range: 65 - 99 mg/dL 409183 (H)  Novolog 4 units 73 60 (L) 109 (H)  Results for Lucas Morgan, Lucas Morgan (MRN [id number]) as of 06/22/2016 10:02  Ref. Range 06/21/2016 12:28 06/21/2016 14:09 06/21/2016 15:28 06/21/2016 16:58 06/21/2016 17:59 06/21/2016 18:53 06/21/2016 20:01 06/21/2016 21:03  Glucose-Capillary Latest Ref Range: 65 - 99 mg/dL 956583 (HH) 213491 (H) 086349 (H) 289 (H) 253 (H) 196 (H) 145 (H) 156 (H)  Levemir 20 units @ 22:16   Review of Glycemic Control  Diabetes history: DM2 Outpatient Diabetes medications: Lantus 20 units QHS, Metformin 250 mg BID Current orders for Inpatient glycemic control: Levemir 20 units QHS, Novolog 0-24 units Q4H  Inpatient Diabetes Program Recommendations: Insulin - Basal: Fasting glucose 60 mg/dl this morning. If patient is not eating well, may want to consider decreasing Levemir to 18 units QHS. Correction (SSI): Patient is ordered TCTS GlucoStabilizer transition order set which is an aggressive insulin scale. Please discontinue order set and use Glycemic Control order set to order CBGs and Novolog moderate correction scale (0-15 units) Q4H. HgbA1C: Please consider ordering an A1C to evaluate glycemic control over the past 2-3 months.  Thanks, Orlando PennerMarie Add Dinapoli, RN, MSN, CDE Diabetes Coordinator Inpatient Diabetes Program [phone] (Team Pager from 8am to 5pm) [phone] ([facility] office) [phone] [facility] office) [phone] [facility] office)

## 2016-06-22 NOTE — Progress Notes (Signed)
Pharmacy Antibiotic Note  Lucas Morgan is Morgan 75 y.o. male admitted on 06/21/2016 with UTI / sepsis.  Pharmacy has been consulted for Vancomycin and Zosyn dosing.  Rocephin has been stopped.    Plan:  Vancomycin 1000mg  x 1 then 500mg  IV q12h Check trough at steady state Zosyn 3.375gm IV q8h, EID Monitor labs, renal fxn, progress and c/s Deescalate ABX when improved / appropriate.    Height: 5\' 8"  (172.7 cm) Weight: 150 lb 2.1 oz (68.1 kg) IBW/kg (Calculated) : 68.4  Temp (24hrs), Avg:97.9 F (36.6 C), Min:96.8 F (36 C), Max:98.7 F (37.1 C)   Recent Labs Lab 06/21/16 1218 06/21/16 1224 06/21/16 1953 06/22/16 0150  WBC  --  14.1*  --  15.8*  CREATININE 1.88*  --  1.36* 1.33*    Estimated Creatinine Clearance: 46.9 mL/min (by C-G formula based on SCr of 1.33 mg/dL (H)).    Allergies  Allergen Reactions  . Trazodone And Nefazodone Other (See Comments)    High blood sugar   Antimicrobials this admission: Rocephin 10/9 >>  Vancomycin 10/10 >> Zosyn 10/10 >>  Dose adjustments this admission: n/Morgan  Microbiology results: 10/9 UCx: pending   Thank you for allowing pharmacy to be Morgan part of this patient's care.  Lucas Morgan, Lucas Morgan 06/22/2016 2:31 PM

## 2016-06-22 NOTE — Progress Notes (Signed)
Initial Nutrition Assessment   INTERVENTION:  Ensure Enlive po BID, each supplement provides 350 kcal and 20 grams of protein   Recommend check current  A1C-%   Provide diet/nutriton education for caregiver's as needed  NUTRITION DIAGNOSIS:   Increased nutrient needs related to  multiple chronic diseases as evidenced by estimated needs.  Unplanned weight loss related to hyperglycemia??? AEB (12% decrease)  since May and family reports blood glucose meter unable to register value at home.   GOAL:   Patient will meet greater than or equal to 90% of their needs   MONITOR:   PO intake, Labs, Weight trends, Supplement acceptance  REASON FOR ASSESSMENT:   Malnutrition Screening Tool    ASSESSMENT: Patient presents with altered mental status. Hx of CVA, dementia, CHF, CAD. He has been evaluated by ST for swallow function and cleared for regular consistency and thin liquids. Patient is eating his lunch and being fed by his daughter. He has eaten almost all of his meal (90%). His 2 brothers are also here and a daughter in law. They all affirm that he usually eats very well. He usually is able to feed himself.   His weight is down significantly 12% over the past 5 months.  Unable to complete Nutrition-Focused physical exam at this time.     Recent Labs Lab 06/21/16 1218 06/21/16 1235 06/21/16 1953 06/22/16 0150  NA 131*  --  136 135  K 4.4  --  3.2* 3.3*  CL 96*  --  104 107  CO2 22  --  22 22  BUN 33*  --  27* 27*  CREATININE 1.88*  --  1.36* 1.33*  CALCIUM 9.2  --  8.6* 8.7*  MG  --  1.1*  --   --   GLUCOSE 584*  --  146* 103*   labs: potassium 3.3, BUN 27, Cr. 1.33 , glu 103     Ref. Range 11/10/2008 01:30 12/21/2009 04:50 05/25/2015 07:05 01/13/2016 06:07  Hemoglobin A1C Latest Ref Range: 4.8 - 5.6 % 8.9... (H) 10.4... (H) 7.5 (H) 8.6 (H)   Meds: insulin, statin  Diet Order:  Diet Carb Modified Fluid consistency: Thin; Room service appropriate? Yes  Skin:  Reviewed,  no issues  Last BM:  10/10   Height:   Ht Readings from Last 1 Encounters:  06/21/16 5\' 8"  (1.727 m)    Weight:   Wt Readings from Last 1 Encounters:  06/22/16 150 lb 2.1 oz (68.1 kg)    Ideal Body Weight:  70 kg  BMI:  Body mass index is 22.83 kg/m.  Estimated Nutritional Needs:   Kcal:  4098-11911904-2140  Protein:  80-88 gr  Fluid:  1.7 liters daily  EDUCATION NEEDS:   Education needs no appropriate at this time  Royann ShiversLynn Amar Sippel MS,RD,CSG,LDN Office: [phone] Pager: [phone]

## 2016-06-22 NOTE — Progress Notes (Signed)
*  PRELIMINARY RESULTS* Echocardiogram 2D Echocardiogram has been performed.  Stacey DrainWhite, Lada Fulbright J 06/22/2016, 3:56 PM

## 2016-06-22 NOTE — Care Management Obs Status (Signed)
MEDICARE OBSERVATION STATUS NOTIFICATION   Patient Details  Name: Lucas EatonJohn C Morgan MRN: [id number] Date of Birth: 03/23/1941   Medicare Observation Status Notification Given:  Yes    Malcolm MetroChildress, Momo Braun Demske, RN 06/22/2016, 2:24 PM

## 2016-06-22 NOTE — Clinical Social Work Note (Signed)
CSW advised Ms. Lucas Morgan that [facility] [place], therefore clinicals could not be sent to this facility. CSW advised that [facility].     [name], Juleen ChinaHeather D, LCSW

## 2016-06-22 NOTE — Progress Notes (Signed)
CRITICAL VALUE ALERT  Critical value received:  Troponin 0.24  Date of notification:  06/22/16 Time of notification:  0245  Critical value read back:yes  Nurse who received alert:  [email or web address]  MD notified (1st page):  Opyd Time of first page: 0250  MD notified (2nd page):  Time of second page:  Responding MD:  Opyd Time MD responded: 255

## 2016-06-22 NOTE — Evaluation (Signed)
Clinical/Bedside Swallow Evaluation Patient Details  Name: Lucas Morgan MRN: [id number] Date of Birth: 1941-03-25  Today's Date: 06/22/2016 Time: SLP Start Time (ACUTE ONLY): 1631 SLP Stop Time (ACUTE ONLY): 1645 SLP Time Calculation (min) (ACUTE ONLY): 14 min  Past Medical History:  Past Medical History:  Diagnosis Date  . AAA (abdominal aortic aneurysm) (HCC)    a. Abd U/S 7/14: mild aneurysmal dilatation 3x3 cm; cholelithiasis without evid of cholecystitis => repeat 1 year  . Arthritis    stenosis, lumbar region  . CHF (congestive heart failure) (HCC)   . Coronary artery disease   . Diabetes mellitus   . Dyslipidemia   . MI (myocardial infarction) 11/09/2008   2.5 x 23 Xience V DES to the CFX  . Osteoporosis   . Renal insufficiency   . Stroke [facility])    Past Surgical History:  Past Surgical History:  Procedure Laterality Date  . BACK SURGERY  2015   lumbar fusion  . CORONARY ANGIOPLASTY    . CORONARY STENT PLACEMENT    . ENDARTERECTOMY Left 06/17/2015   Procedure: Left Carotid ENDARTERECTOMY with Lucas Morgan Patch;  Surgeon: Lucas Hint, MD;  Location: [facility] OR;  Service: Vascular;  Laterality: Left;  . EP IMPLANTABLE DEVICE N/A 05/26/2015   Procedure: Loop Recorder Insertion;  Surgeon: Lucas Maw, MD;  Location: [facility] INVASIVE CV LAB;  Service: Cardiovascular;  Laterality: N/A;  . 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 JOINT REPLACEMENT Left   . KNEE SURGERY    . LUMBAR LAMINECTOMY/DECOMPRESSION MICRODISCECTOMY N/A 10/18/2014   Procedure: LUMBAR LAMINECTOMY/DECOMPRESSION MICRODISCECTOMYLUMBAR THREE-FOUR ;  Surgeon: Lucas Pacini, MD;  Location: [facility] NEURO ORS;  Service: Neurosurgery;  Laterality: N/A;  . TEE WITHOUT CARDIOVERSION N/A 05/26/2015   Procedure: TRANSESOPHAGEAL ECHOCARDIOGRAM (TEE);  Surgeon: Lucas Mixer, MD;  Location: [facility] ENDOSCOPY;  Service: Cardiovascular;  Laterality: N/A;   HPI:   Lucas Morgan a 75 y.o.malewith  medical history significant of CVA, dementia, CKD, DM, CAD, CHF presenting with AMS. CT of head with no evidence of acute intracranial abnormality and stable left frontal and left occipital lobe encephalomalacia. ST to evaluate swallow function. Note prior ST involvement from residual CVA (2016) with deficits including expressive aphasia and mild dysarthria. Chest x ray without acute cardioplumonary change.    Assessment / Plan / Recommendation Clinical Impression  Pt presents with mild oral dysphagia. Pt with mild right sided oral motor deficits including reduced lingual strength, felt as residual deficit of prior CVA (2016: left hemispheric regions). This resulted in mildy prolonged mastication of solid PO. No overt signs or symptoms of aspiration with any PO this date. Nursing denies acute swallow difficutly. Recommend continue regular thin diet. No further ST needs identified.     Aspiration Risk  Mild aspiration risk    Diet Recommendation   Regular thin liquids  Medication Administration: Whole meds with liquid    Other  Recommendations Oral Care Recommendations: Oral care BID   Follow up Recommendations 24 hour supervision/assistance      Frequency and Duration            Prognosis Prognosis for Safe Diet Advancement: Good Barriers to Reach Goals: Cognitive deficits      Swallow Study   General Date of Onset: 06/21/16 HPI:  Lucas Morgan a 75 y.o.malewith medical history significant of CVA, dementia, CKD, DM, CAD, CHF presenting with AMS. CT of head with no evidence of acute  intracranial abnormality and stable left frontal and left occipital lobe encephalomalacia. ST to evaluate swallow function. Note prior ST involvement from residual CVA (2016) with deficits including expressive aphasia and mild dysarthria. Chest x ray without acute cardioplumonary change.  Type of Study: Bedside Swallow Evaluation Previous Swallow Assessment: none on file Diet Prior to this Study:  Regular;Thin liquids Temperature Spikes Noted: No Respiratory Status: Room air History of Recent Intubation: No Behavior/Cognition: Alert Oral Cavity Assessment: Within Functional Limits Oral Cavity - Dentition: Dentures, top;Edentulous;Other (Comment) (edentulous lower oral cavity ) Self-Feeding Abilities: Needs assist;Able to feed self Patient Positioning: Upright in bed Baseline Vocal Quality: Low vocal intensity Volitional Cough: Strong Volitional Swallow: Able to elicit    Oral/Motor/Sensory Function Overall Oral Motor/Sensory Function: Mild impairment (suspect residual right sided oral deficits from past CVA ) Lingual Strength: Reduced (right sided )   Ice Chips Ice chips: Not tested   Thin Liquid Thin Liquid: Within functional limits Presentation: Straw;Cup;Spoon    Nectar Thick Nectar Thick Liquid: Not tested   Honey Thick Honey Thick Liquid: Not tested   Puree Puree: Within functional limits   Solid   GO   Solid: Impaired Presentation: Self Fed Oral Phase Impairments: Reduced lingual movement/coordination Oral Phase Functional Implications: Prolonged oral transit Pharyngeal Phase Impairments: Suspected delayed Swallow;Multiple swallows    Functional Assessment Tool Used: skilled observation Functional Limitations: Swallowing Swallow Current Status ([id number]): At least 1 percent but less than 20 percent impaired, limited or restricted Swallow Goal Status [phone]([id number]): At least 1 percent but less than 20 percent impaired, limited or restricted Swallow Discharge Status [phone]): At least 1 percent but less than 20 percent impaired, limited or restricted       Lucas Duoshelsea Sumney MA, CCC-SLP Acute Care Speech Language Pathologist    Lucas Morgan, Lucas Morgan 06/22/2016,4:56 PM

## 2016-06-22 NOTE — Care Management Note (Signed)
Case Management Note  Patient Details  Name: Krystal EatonJohn C Dipinto MRN: [id number] Date of Birth: 04/10/1941  Subjective/Objective:                  Pt admitted with AMS. He is from home, lives with his daughter and grandson. Pt is ind with ADL's at baseline Family anticipates SNF at DC. CSw is aware PT eval pending. No HH services PTA.   Action/Plan: Will cont to follow.   Expected Discharge Date:    06/25/2016              Expected Discharge Plan:  Skilled Nursing Facility  In-House Referral:  Clinical Social Work  Discharge planning Services  CM Consult  Post Acute Care Choice:  NA Choice offered to:  NA  DME Arranged:    DME Agency:     HH Arranged:    HH Agency:     Status of Service:  In process, will continue to follow  If discussed at Long Length of Stay Meetings, dates discussed:    Additional Comments:  Malcolm MetroChildress, Elmer Boutelle Demske, RN 06/22/2016, 2:23 PM

## 2016-06-22 NOTE — NC FL2 (Signed)
[place] MEDICAID FL2 LEVEL OF CARE SCREENING TOOL     IDENTIFICATION  Patient Name: Lucas Morgan Birthdate: 1941/04/02 Sex: male Admission Date (Current Location): 06/21/2016  [facility] and [place] Number:      Facility and Address:         Provider Number: [id number]  Attending Physician Name and Address:  Kathlen Mody, MD  Relative Name and Phone Number:       Current Level of Care: Hospital Recommended Level of Care: Skilled Nursing Facility Prior Approval Number:    Date Approved/Denied:   PASRR Number:    Discharge Plan: Home    Current Diagnoses: Patient Active Problem List   Diagnosis Date Noted  . AKI (acute kidney injury) (HCC)   . Altered mental status 06/21/2016  . Hypomagnesemia 06/21/2016  . Elevated troponin 06/21/2016  . Left leg weakness   . Acute CVA (cerebrovascular accident) (HCC) 01/14/2016  . Fall at home 01/13/2016  . Ataxia 01/13/2016  . Orthostatic hypotension 01/13/2016  . Hip pain, acute   . 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 Tremor of both hands 11/18/2015  . HLD (hyperlipidemia) 11/18/2015  . Type 2 diabetes mellitus with stage 2 chronic kidney disease, with long-term current use of insulin (HCC) 08/15/2015  . S/P carotid endarterectomy 08/15/2015  . Acute on chronic systolic congestive heart failure (HCC) 08/15/2015  . Cardiomyopathy, ischemic 06/18/2015  . Cerebral infarction due to unspecified mechanism   . Chronic pain syndrome 06/11/2015  . Adjustment disorder with anxious mood 06/11/2015  . Aphasia S/P CVA 05/28/2015  . Right hemiparesis (HCC) 05/28/2015  . Stroke, acute, embolic (HCC) 05/27/2015  . Carotid stenosis   . Acute on chronic renal failure (HCC) 05/24/2015  . CVA (cerebral infarction) 05/24/2015  . Leukocytosis 05/24/2015  . Foreign body in colon 04/08/2015  . Spinal stenosis of lumbar region 10/18/2014  . Lumbar stenosis with neurogenic claudication 10/18/2014   . Thyroid nodule 03/12/2013  . AAA (abdominal aortic aneurysm) (HCC) 03/12/2013  . PULMONARY NODULE 08/14/2010  . COUGH 06/30/2010  . HYPOTENSION 05/04/2010  . AODM 01/27/2009  . CAD, NATIVE VESSEL 12/26/2008    Orientation RESPIRATION BLADDER Height & Weight     Self, Situation, Place  Normal Continent Weight: 150 lb 2.1 oz (68.1 kg) Height:  5\' 8"  (172.7 cm)  BEHAVIORAL SYMPTOMS/MOOD NEUROLOGICAL BOWEL NUTRITION STATUS      Continent Diet (Carb Modified)  AMBULATORY STATUS COMMUNICATION OF NEEDS Skin   Extensive Assist Verbally Normal                       Personal Care Assistance Level of Assistance  Bathing, Feeding, Dressing Bathing Assistance: Limited assistance Feeding assistance: Independent Dressing Assistance: Limited assistance     Functional Limitations Info  Sight, Hearing, Speech Sight Info: Adequate Hearing Info: Adequate Speech Info: Adequate    SPECIAL CARE FACTORS FREQUENCY                       Contractures Contractures Info: Not present    Additional Factors Info  Code Status, Allergies, Isolation Precautions Code Status Info: Full Codd Allergies Info: Trazadone and Nefazodone     Isolation Precautions Info: 06/21/16 mrsa by pcr     Current Medications (06/22/2016):  This is the current hospital active medication list Current Facility-Administered Medications  Medication Dose Route Frequency Provider Last Rate Last Dose  . acetaminophen (TYLENOL) tablet 650 mg  650 mg  Oral Q6H PRN Jonah BlueJennifer Yates, MD   650 mg at 06/22/16 16100027   Or  . acetaminophen (TYLENOL) suppository 650 mg  650 mg Rectal Q6H PRN Jonah BlueJennifer Yates, MD      . aspirin chewable tablet 81 mg  81 mg Oral Daily Jonah BlueJennifer Yates, MD   81 mg at 06/22/16 0900  . atorvastatin (LIPITOR) tablet 80 mg  80 mg Oral q1800 Jonah BlueJennifer Yates, MD   80 mg at 06/21/16 2014  . bisacodyl (DULCOLAX) EC tablet 5 mg  5 mg Oral Daily PRN Jonah BlueJennifer Yates, MD      . carvedilol (COREG) tablet 3.125  mg  3.125 mg Oral BID WC Briscoe Deutscherimothy S Opyd, MD   3.125 mg at 06/21/16 2013  . cefTRIAXone (ROCEPHIN) 1 g in dextrose 5 % 50 mL IVPB  1 g Intravenous Q24H Jonah BlueJennifer Yates, MD      . Chlorhexidine Gluconate Cloth 2 % PADS 6 each  6 each Topical Q0600 Jonah BlueJennifer Yates, MD      . citalopram (CELEXA) tablet 20 mg  20 mg Oral Daily Jonah BlueJennifer Yates, MD   20 mg at 06/22/16 0900  . clopidogrel (PLAVIX) tablet 75 mg  75 mg Oral Daily Jonah BlueJennifer Yates, MD   75 mg at 06/22/16 0900  . cyclobenzaprine (FLEXERIL) tablet 5 mg  5 mg Oral Daily PRN Jonah BlueJennifer Yates, MD      . enoxaparin (LOVENOX) injection 40 mg  40 mg Subcutaneous Q24H Jonah BlueJennifer Yates, MD   40 mg at 06/21/16 2113  . gabapentin (NEURONTIN) capsule 300 mg  300 mg Oral TID Jonah BlueJennifer Yates, MD   300 mg at 06/22/16 1000  . insulin aspart (novoLOG) injection 0-15 Units  0-15 Units Subcutaneous Q4H Kathlen ModyVijaya Akula, MD   11 Units at 06/22/16 1130  . insulin detemir (LEVEMIR) injection 20 Units  20 Units Subcutaneous QHS Jonah BlueJennifer Yates, MD      . mupirocin ointment (BACTROBAN) 2 % 1 application  1 application Nasal BID Jonah BlueJennifer Yates, MD   1 application at 06/22/16 0900  . ondansetron (ZOFRAN) tablet 4 mg  4 mg Oral Q6H PRN Jonah BlueJennifer Yates, MD       Or  . ondansetron [facility](ZOFRAN) injection 4 mg  4 mg Intravenous Q6H PRN Jonah BlueJennifer Yates, MD      . oxyCODONE-acetaminophen (PERCOCET/ROXICET) 5-325 MG per tablet 1-2 tablet  1-2 tablet Oral Q6H PRN Jonah BlueJennifer Yates, MD   1 tablet at 06/21/16 2014  . pantoprazole (PROTONIX) EC tablet 40 mg  40 mg Oral Daily Jonah BlueJennifer Yates, MD   40 mg at 06/22/16 0900  . sodium chloride flush (NS) 0.9 % injection 3 mL  3 mL Intravenous Q12H Jonah BlueJennifer Yates, MD   3 mL at 06/22/16 0900  . tamsulosin (FLOMAX) capsule 0.4 mg  0.4 mg Oral Daily Jonah BlueJennifer Yates, MD   0.4 mg at 06/22/16 0900     Discharge Medications: Please see discharge summary for a list of discharge medications.  Relevant Imaging Results:  Relevant Lab Results:   Additional  Information SS#: [id number]  Annice NeedySettle, Aseret Hoffman D, LCSW

## 2016-06-22 NOTE — Progress Notes (Signed)
PT Cancellation Note  Patient Details Name: Lucas EatonJohn C Twiford MRN: [id number] DOB: 08/20/1941   Cancelled Treatment:    Reason Eval/Treat Not Completed: Patient at procedure or test/unavailable;Patient not medically ready (Pt with elevated troponin 0.23, as well as hypotension with BP: 80/44, and currently having an US done.  Will hold PT today, and check back tomorrow. )   Waynetta SandyBeth Selden Noteboom, PT, DPT X: [phone]

## 2016-06-22 NOTE — Consult Note (Signed)
[facility]  A. , MD     [email or web address]          Lucas Morgan is an 75 y.o. male.   ASSESSMENT/PLAN: 1. Multifactorial toxic metabolic encephalopathy with the most significant etiology being severe hyperglycemia. Dehydration and medication effect is also an issue. Agree with the current management. No additional workup with stress at this time.  2. Low back pain status post fall. As needed analgesics are suggested although we would suggest avoiding opioids if possible.   3. Multiple cortical infarcts. Etiology is thought to be cryptogenic at this time of though there are concerns for cardioembolic stroke. He does have a low EF but Loop recorder have failed to yield atrial fibrillation. We will suggest that he continue his dual antiplatelet agents at this time.      This is a 75-year-old white male who presents with confusion, hallucination and also mental status. He was noted by family to have high blood sugars of 400 at home. The confusion persisted the patient was taken to the hospital for further evaluation where he was noted to have significant hyperglycemia with a glucose of 560. The patient is also on pain medications Percocet 7.5 4 times a day. This is being prescribed an as-needed basis. He was given pain medications last night before presented to the hospital. The patient's confusion has improved since he has been hospitalized. He has been started on a insulin drip. The patient complains of significant low back pain. Tells me that he fell before presenting to the hospital. He appears to have chronic low back pain and other pain syndromes however. 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 The patient has had extensive workup for previous infarcts. He was seen here a few months ago and workup was unrevealing. He did have a 30 day event monitor. The results are reviewed and no atrial fibrillation is  recorded. He also has had a loop recorder was so far as being negative for atrial fibrillation. ROS is otherwise unrevealing.  GENERAL: Pleasant in no acute distress.  HEENT: Mild left ptosis noted. There is clouding of the cornea on the left.  ABDOMEN: soft  EXTREMITIES: No edema   BACK: Mild superficial bruising left buttock region next to midline.  SKIN: Normal by inspection.    MENTAL STATUS: He is awake and alert. Oriented to month and year. He also oriented to location. He is mostly lucid and coherent at this time. Follows commands well. There is no dysarthria.  CRANIAL NERVES: Pupils are equal, round and reactive to light and accomodation; extra ocular movements are full, there is no significant nystagmus; visual fields are full; upper and lower facial muscles are normal in strength and symmetric, there is no flattening of the nasolabial folds; tongue is midline; uvula is midline; shoulder elevation is normal.  MOTOR: Normal tone, bulk and strength; no pronator drift.  COORDINATION: Left finger to nose is normal, right finger to nose is normal, No rest tremor; no intention tremor; no postural tremor; no bradykinesia.  REFLEXES: Deep tendon reflexes are symmetrical and normal. Babinski reflexes are flexor bilaterally.   SENSATION: Normal to light touch and temperature.     Blood pressure (!) 129/99, pulse 82, temperature 97.9 F (36.6 C), temperature source Oral, resp. rate (!) 26, height 5' 8" (1.727 m), weight 150 lb 2.1 oz (68.1 kg), SpO2 100 %.  Past Medical History:  Diagnosis Date  .   AAA (abdominal aortic aneurysm) (HCC)    a. Abd U/S 7/14: mild aneurysmal dilatation 3x3 cm; cholelithiasis without evid of cholecystitis => repeat 1 year  . Arthritis    stenosis, lumbar region  . CHF (congestive heart failure) (HCC)   . Coronary artery disease   . Diabetes mellitus   . Dyslipidemia   . MI (myocardial infarction) 11/09/2008   2.5 x 23 Xience V DES to the CFX  .  Osteoporosis   . Renal insufficiency   . Stroke (HCC)     Past Surgical History:  Procedure Laterality Date  . BACK SURGERY  2015   lumbar fusion  . CORONARY ANGIOPLASTY    . CORONARY STENT PLACEMENT    . ENDARTERECTOMY Left 06/17/2015   Procedure: Left Carotid ENDARTERECTOMY with Xenosure Patch;  Surgeon: Christopher S Dickson, MD;  Location: [facility] OR;  Service: Vascular;  Laterality: Left;  . EP IMPLANTABLE DEVICE N/A 05/26/2015   Procedure: Loop Recorder Insertion;  Surgeon: Gregg W Taylor, MD;  Location: [facility] INVASIVE CV LAB;  Service: Cardiovascular;  Laterality: N/A;  . 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 JOINT REPLACEMENT Left   . KNEE SURGERY    . LUMBAR LAMINECTOMY/DECOMPRESSION MICRODISCECTOMY N/A 10/18/2014   Procedure: LUMBAR LAMINECTOMY/DECOMPRESSION MICRODISCECTOMYLUMBAR THREE-FOUR ;  Surgeon: Henry A Pool, MD;  Location: [facility] NEURO ORS;  Service: Neurosurgery;  Laterality: N/A;  . TEE WITHOUT CARDIOVERSION N/A 05/26/2015   Procedure: TRANSESOPHAGEAL ECHOCARDIOGRAM (TEE);  Surgeon: Philip J Nahser, MD;  Location: [facility] ENDOSCOPY;  Service: Cardiovascular;  Laterality: N/A;    Family History  Problem Relation Age of Onset  . Heart attack Brother     x2 brothers  . CAD Brother   . Stroke Mother     Social History:  reports that he has never smoked. He has never used smokeless tobacco. He reports that he does not drink alcohol or use drugs.  Allergies:  Allergies  Allergen Reactions  . Trazodone And Nefazodone Other (See Comments)    High blood sugar    Medications: Prior to Admission medications   Medication Sig Start Date End Date Taking? Authorizing Provider  aspirin 81 MG tablet Take 1 tablet (81 mg total) by mouth daily. 06/19/15  Yes Shanker M Ghimire, MD  atorvastatin (LIPITOR) 80 MG tablet Take 1 tablet (80 mg total) by mouth daily. 09/17/15  Yes Jonathan F Branch, MD  bisacodyl (DULCOLAX) 5 MG EC tablet Take 5 mg by mouth daily as  needed for moderate constipation.   Yes Historical Provider, MD  carvedilol (COREG) 3.125 MG tablet Take 1 tablet (3.125 mg total) by mouth 2 (two) times daily with a meal. 06/19/15  Yes Shanker M Ghimire, MD  clopidogrel (PLAVIX) 75 MG tablet Take 1 tablet (75 mg total) by mouth daily. 06/19/15  Yes Shanker M Ghimire, MD  cyclobenzaprine (FLEXERIL) 5 MG tablet Take 5 mg by mouth daily as needed for muscle spasms.   Yes Historical Provider, MD  escitalopram (LEXAPRO) 5 MG tablet Take 5 mg by mouth daily.   Yes Historical Provider, MD  gabapentin (NEURONTIN) 100 MG capsule TAKE 3 CAPSULES(300 MG) BY MOUTH THREE TIMES DAILY 09/15/15  Yes Jindong Xu, MD  insulin glargine (LANTUS) 100 UNIT/ML injection Inject 0.4 mLs (40 Units total) into the skin at bedtime. Patient taking differently: Inject 20 Units into the skin at bedtime.  01/16/16  Yes Jehanzeb Memon, MD  Liniments (BLUE-EMU SUPER STRENGTH) CREA Apply topically.     Yes Historical Provider, MD  lisinopril (PRINIVIL,ZESTRIL) 5 MG tablet Take 5 mg by mouth daily.   Yes Historical Provider, MD  metFORMIN (GLUCOPHAGE) 500 MG tablet Take 250 mg by mouth 2 (two) times daily with a meal.   Yes Historical Provider, MD  oxyCODONE-acetaminophen (PERCOCET/ROXICET) 5-325 MG tablet Take 1-2 tablets by mouth every 6 (six) hours as needed. 04/10/16  Yes Kristen N Ward, DO  pantoprazole (PROTONIX) 40 MG tablet Take 40 mg by mouth daily.   Yes Historical Provider, MD  tamsulosin (FLOMAX) 0.4 MG CAPS capsule Take 1 capsule (0.4 mg total) by mouth daily. 06/19/15  Yes Shanker Kristeen Mans, MD    Scheduled Meds: . aspirin  81 mg Oral Daily  . atorvastatin  80 mg Oral q1800  . carvedilol  3.125 mg Oral BID WC  . Chlorhexidine Gluconate Cloth  6 each Topical Q0600  . citalopram  20 mg Oral Daily  . clopidogrel  75 mg Oral Daily  . enoxaparin (LOVENOX) injection  40 mg Subcutaneous Q24H  . [START ON 06/23/2016] feeding supplement (ENSURE ENLIVE)  237 mL Oral BID BM  .  gabapentin  300 mg Oral TID  . [START ON 06/23/2016] Influenza vac split quadrivalent PF  0.5 mL Intramuscular Tomorrow-1000  . insulin aspart  0-15 Units Subcutaneous Q4H  . insulin detemir  20 Units Subcutaneous QHS  . mupirocin ointment  1 application Nasal BID  . pantoprazole  40 mg Oral Daily  . piperacillin-tazobactam (ZOSYN)  IV  3.375 g Intravenous Once  . piperacillin-tazobactam (ZOSYN)  IV  3.375 g Intravenous Q8H  . [START ON 06/23/2016] pneumococcal 23 valent vaccine  0.5 mL Intramuscular Tomorrow-1000  . sodium chloride flush  3 mL Intravenous Q12H  . tamsulosin  0.4 mg Oral Daily  . [START ON 06/23/2016] vancomycin  500 mg Intravenous Q12H   Continuous Infusions:  PRN Meds:.acetaminophen **OR** acetaminophen, bisacodyl, cyclobenzaprine, ondansetron **OR** ondansetron (ZOFRAN) IV, oxyCODONE-acetaminophen     Results for orders placed or performed during the hospital encounter of 06/21/16 (from the past 48 hour(s))  Comprehensive metabolic panel     Status: Abnormal   Collection Time: 06/21/16 12:18 PM  Result Value Ref Range   Sodium 131 (L) 135 - 145 mmol/L   Potassium 4.4 3.5 - 5.1 mmol/L   Chloride 96 (L) 101 - 111 mmol/L   CO2 22 22 - 32 mmol/L   Glucose, Bld 584 (HH) 65 - 99 mg/dL    Comment: CRITICAL RESULT CALLED TO, READ BACK BY AND VERIFIED WITH: EDWARDS,C AT 1330 ON 06/21/2016 BY ISLEY,B    BUN 33 (H) 6 - 20 mg/dL   Creatinine, Ser 1.88 (H) 0.61 - 1.24 mg/dL   Calcium 9.2 8.9 - 10.3 mg/dL   Total Protein 6.9 6.5 - 8.1 g/dL   Albumin 3.9 3.5 - 5.0 g/dL   AST 28 15 - 41 U/L   ALT 18 17 - 63 U/L   Alkaline Phosphatase 79 38 - 126 U/L   Total Bilirubin 1.7 (H) 0.3 - 1.2 mg/dL   GFR calc non Af Amer 34 (L) >60 mL/min   GFR calc Af Amer 39 (L) >60 mL/min    Comment: (NOTE) The eGFR has been calculated using the CKD EPI equation. This calculation has not been validated in all clinical situations. eGFR's persistently <60 mL/min signify possible Chronic  Kidney Disease.    Anion gap 13 5 - 15  CBC with Differential     Status: Abnormal   Collection Time: 06/21/16  12:24 PM  Result Value Ref Range   WBC 14.1 (H) 4.0 - 10.5 K/uL   RBC 4.29 4.22 - 5.81 MIL/uL   Hemoglobin 14.0 13.0 - 17.0 g/dL   HCT 39.5 39.0 - 52.0 %   MCV 92.1 78.0 - 100.0 fL   MCH 32.6 26.0 - 34.0 pg   MCHC 35.4 30.0 - 36.0 g/dL   RDW 12.9 11.5 - 15.5 %   Platelets 194 150 - 400 K/uL   Neutrophils Relative % 78 %   Neutro Abs 11.0 (H) 1.7 - 7.7 K/uL   Lymphocytes Relative 11 %   Lymphs Abs 1.5 0.7 - 4.0 K/uL   Monocytes Relative 11 %   Monocytes Absolute 1.5 (H) 0.1 - 1.0 K/uL   Eosinophils Relative 0 %   Eosinophils Absolute 0.0 0.0 - 0.7 K/uL   Basophils Relative 0 %   Basophils Absolute 0.0 0.0 - 0.1 K/uL  Troponin I     Status: Abnormal   Collection Time: 06/21/16 12:24 PM  Result Value Ref Range   Troponin I 0.06 (HH) <0.03 ng/mL    Comment: CRITICAL RESULT CALLED TO, READ BACK BY AND VERIFIED WITH: CARDWELL,L AT 1350 ON 06/21/2016 BY ISLEY,B   Urinalysis, Routine w reflex microscopic     Status: Abnormal   Collection Time: 06/21/16 12:25 PM  Result Value Ref Range   Color, Urine YELLOW YELLOW   APPearance CLEAR CLEAR   Specific Gravity, Urine 1.010 1.005 - 1.030   pH 5.5 5.0 - 8.0   Glucose, UA >1000 (A) NEGATIVE mg/dL   Hgb urine dipstick MODERATE (A) NEGATIVE   Bilirubin Urine NEGATIVE NEGATIVE   Ketones, ur 15 (A) NEGATIVE mg/dL   Protein, ur NEGATIVE NEGATIVE mg/dL   Nitrite NEGATIVE NEGATIVE   Leukocytes, UA NEGATIVE NEGATIVE  Urine rapid drug screen (hosp performed)     Status: Abnormal   Collection Time: 06/21/16 12:25 PM  Result Value Ref Range   Opiates POSITIVE (A) NONE DETECTED   Cocaine NONE DETECTED NONE DETECTED   Benzodiazepines NONE DETECTED NONE DETECTED   Amphetamines NONE DETECTED NONE DETECTED   Tetrahydrocannabinol NONE DETECTED NONE DETECTED   Barbiturates NONE DETECTED NONE DETECTED    Comment:        DRUG SCREEN FOR  MEDICAL PURPOSES ONLY.  IF CONFIRMATION IS NEEDED FOR ANY PURPOSE, NOTIFY LAB WITHIN 5 DAYS.        LOWEST DETECTABLE LIMITS FOR URINE DRUG SCREEN Drug Class       Cutoff (ng/mL) Amphetamine      1000 Barbiturate      200 Benzodiazepine   200 Tricyclics       300 Opiates          300 Cocaine          300 THC              50   Acetaminophen level     Status: Abnormal   Collection Time: 06/21/16 12:25 PM  Result Value Ref Range   Acetaminophen (Tylenol), Serum <10 (L) 10 - 30 ug/mL    Comment:        THERAPEUTIC CONCENTRATIONS VARY SIGNIFICANTLY. A RANGE OF 10-30 ug/mL MAY BE AN EFFECTIVE CONCENTRATION FOR MANY PATIENTS. HOWEVER, SOME ARE BEST TREATED AT CONCENTRATIONS OUTSIDE THIS RANGE. ACETAMINOPHEN CONCENTRATIONS >150 ug/mL AT 4 HOURS AFTER INGESTION AND >50 ug/mL AT 12 HOURS AFTER INGESTION ARE OFTEN ASSOCIATED WITH TOXIC REACTIONS.   Ethanol     Status: None   Collection Time:   06/21/16 12:25 PM  Result Value Ref Range   Alcohol, Ethyl (B) <5 <5 mg/dL    Comment:        LOWEST DETECTABLE LIMIT FOR SERUM ALCOHOL IS 5 mg/dL FOR MEDICAL PURPOSES ONLY   Salicylate level     Status: None   Collection Time: 06/21/16 12:25 PM  Result Value Ref Range   Salicylate Lvl <7.0 2.8 - 30.0 mg/dL  Urine microscopic-add on     Status: Abnormal   Collection Time: 06/21/16 12:25 PM  Result Value Ref Range   Squamous Epithelial / LPF 0-5 (A) NONE SEEN   WBC, UA 0-5 0 - 5 WBC/hpf   RBC / HPF 0-5 0 - 5 RBC/hpf   Bacteria, UA MANY (A) NONE SEEN  CBG monitoring, ED     Status: Abnormal   Collection Time: 06/21/16 12:28 PM  Result Value Ref Range   Glucose-Capillary 583 (HH) 65 - 99 mg/dL   Comment 1 Notify RN   Magnesium     Status: Abnormal   Collection Time: 06/21/16 12:35 PM  Result Value Ref Range   Magnesium 1.1 (L) 1.7 - 2.4 mg/dL  CBG monitoring, ED     Status: Abnormal   Collection Time: 06/21/16  2:09 PM  Result Value Ref Range   Glucose-Capillary 491 (H) 65 -  99 mg/dL   Comment 1 Notify RN   CBG monitoring, ED     Status: Abnormal   Collection Time: 06/21/16  3:28 PM  Result Value Ref Range   Glucose-Capillary 349 (H) 65 - 99 mg/dL  MRSA PCR Screening     Status: Abnormal   Collection Time: 06/21/16  4:35 PM  Result Value Ref Range   MRSA by PCR POSITIVE (A) NEGATIVE    Comment:        The GeneXpert MRSA Assay (FDA approved for NASAL specimens only), is one component of a comprehensive MRSA colonization surveillance program. It is not intended to diagnose MRSA infection nor to guide or monitor treatment for MRSA infections. RESULT CALLED TO, READ BACK BY AND VERIFIED WITH: HEARN,J ON 06/21/16 AT 2300 BY LOY,C   Glucose, capillary     Status: Abnormal   Collection Time: 06/21/16  4:58 PM  Result Value Ref Range   Glucose-Capillary 289 (H) 65 - 99 mg/dL  Glucose, capillary     Status: Abnormal   Collection Time: 06/21/16  5:59 PM  Result Value Ref Range   Glucose-Capillary 253 (H) 65 - 99 mg/dL  Glucose, capillary     Status: Abnormal   Collection Time: 06/21/16  6:53 PM  Result Value Ref Range   Glucose-Capillary 196 (H) 65 - 99 mg/dL  Basic metabolic panel     Status: Abnormal   Collection Time: 06/21/16  7:53 PM  Result Value Ref Range   Sodium 136 135 - 145 mmol/L   Potassium 3.2 (L) 3.5 - 5.1 mmol/L    Comment: DELTA CHECK NOTED   Chloride 104 101 - 111 mmol/L   CO2 22 22 - 32 mmol/L   Glucose, Bld 146 (H) 65 - 99 mg/dL   BUN 27 (H) 6 - 20 mg/dL   Creatinine, Ser 1.36 (H) 0.61 - 1.24 mg/dL   Calcium 8.6 (L) 8.9 - 10.3 mg/dL   GFR calc non Af Amer 50 (L) >60 mL/min   GFR calc Af Amer 58 (L) >60 mL/min    Comment: (NOTE) The eGFR has been calculated using the CKD EPI equation. This calculation has not been   validated in all clinical situations. eGFR's persistently <60 mL/min signify possible Chronic Kidney Disease.    Anion gap 10 5 - 15  Troponin I     Status: Abnormal   Collection Time: 06/21/16  7:53 PM  Result  Value Ref Range   Troponin I 0.10 (HH) <0.03 ng/mL    Comment: CRITICAL RESULT CALLED TO, READ BACK BY AND VERIFIED WITH: SMITH,J AT 2045 ON 06/21/2016 BY ISLEY,B   Glucose, capillary     Status: Abnormal   Collection Time: 06/21/16  8:01 PM  Result Value Ref Range   Glucose-Capillary 145 (H) 65 - 99 mg/dL   Comment 1 Notify RN   Glucose, capillary     Status: Abnormal   Collection Time: 06/21/16  9:03 PM  Result Value Ref Range   Glucose-Capillary 156 (H) 65 - 99 mg/dL   Comment 1 Notify RN   Glucose, capillary     Status: Abnormal   Collection Time: 06/22/16 12:08 AM  Result Value Ref Range   Glucose-Capillary 183 (H) 65 - 99 mg/dL   Comment 1 Notify RN   Basic metabolic panel     Status: Abnormal   Collection Time: 06/22/16  1:50 AM  Result Value Ref Range   Sodium 135 135 - 145 mmol/L   Potassium 3.3 (L) 3.5 - 5.1 mmol/L   Chloride 107 101 - 111 mmol/L   CO2 22 22 - 32 mmol/L   Glucose, Bld 103 (H) 65 - 99 mg/dL   BUN 27 (H) 6 - 20 mg/dL   Creatinine, Ser 1.33 (H) 0.61 - 1.24 mg/dL   Calcium 8.7 (L) 8.9 - 10.3 mg/dL   GFR calc non Af Amer 51 (L) >60 mL/min   GFR calc Af Amer 59 (L) >60 mL/min    Comment: (NOTE) The eGFR has been calculated using the CKD EPI equation. This calculation has not been validated in all clinical situations. eGFR's persistently <60 mL/min signify possible Chronic Kidney Disease.    Anion gap 6 5 - 15  CBC     Status: Abnormal   Collection Time: 06/22/16  1:50 AM  Result Value Ref Range   WBC 15.8 (H) 4.0 - 10.5 K/uL   RBC 4.01 (L) 4.22 - 5.81 MIL/uL   Hemoglobin 13.0 13.0 - 17.0 g/dL   HCT 36.4 (L) 39.0 - 52.0 %   MCV 90.8 78.0 - 100.0 fL   MCH 32.4 26.0 - 34.0 pg   MCHC 35.7 30.0 - 36.0 g/dL   RDW 12.6 11.5 - 15.5 %   Platelets 207 150 - 400 K/uL  Troponin I     Status: Abnormal   Collection Time: 06/22/16  1:50 AM  Result Value Ref Range   Troponin I 0.24 (HH) <0.03 ng/mL    Comment: CRITICAL RESULT CALLED TO, READ BACK BY AND  VERIFIED WITH: WAGONER R AT 0233 ON [id number] BY FORSYTH K   Glucose, capillary     Status: None   Collection Time: 06/22/16  4:13 AM  Result Value Ref Range   Glucose-Capillary 73 65 - 99 mg/dL   Comment 1 Notify RN   Glucose, capillary     Status: Abnormal   Collection Time: 06/22/16  7:22 AM  Result Value Ref Range   Glucose-Capillary 60 (L) 65 - 99 mg/dL   Comment 1 Notify RN    Comment 2 Document in Chart   Glucose, capillary     Status: Abnormal   Collection Time: 06/22/16  8:02 AM  Result   Value Ref Range   Glucose-Capillary 109 (H) 65 - 99 mg/dL  Troponin I     Status: Abnormal   Collection Time: 06/22/16  8:05 AM  Result Value Ref Range   Troponin I 0.23 (HH) <0.03 ng/mL    Comment: CRITICAL VALUE NOTED.  VALUE IS CONSISTENT WITH PREVIOUSLY REPORTED AND CALLED VALUE.  Glucose, capillary     Status: Abnormal   Collection Time: 06/22/16 11:30 AM  Result Value Ref Range   Glucose-Capillary 303 (H) 65 - 99 mg/dL  Glucose, capillary     Status: Abnormal   Collection Time: 06/22/16  1:41 PM  Result Value Ref Range   Glucose-Capillary 235 (H) 65 - 99 mg/dL  Hepatic function panel     Status: None   Collection Time: 06/22/16  2:00 PM  Result Value Ref Range   Total Protein 6.9 6.5 - 8.1 g/dL   Albumin 3.6 3.5 - 5.0 g/dL   AST 38 15 - 41 U/L   ALT 20 17 - 63 U/L   Alkaline Phosphatase 66 38 - 126 U/L   Total Bilirubin 1.0 0.3 - 1.2 mg/dL   Bilirubin, Direct 0.1 0.1 - 0.5 mg/dL   Indirect Bilirubin 0.9 0.3 - 0.9 mg/dL  Troponin I     Status: Abnormal   Collection Time: 06/22/16  4:28 PM  Result Value Ref Range   Troponin I 0.16 (HH) <0.03 ng/mL    Comment: CRITICAL VALUE NOTED.  VALUE IS CONSISTENT WITH PREVIOUSLY REPORTED AND CALLED VALUE.  Lactic acid, plasma     Status: Abnormal   Collection Time: 06/22/16  4:28 PM  Result Value Ref Range   Lactic Acid, Venous 2.8 (HH) 0.5 - 1.9 mmol/L    Comment: CRITICAL RESULT CALLED TO, READ BACK BY AND VERIFIED WITH: SPANGLER,E  ON 06/22/16 AT 1730 BY LOY,C   Procalcitonin     Status: None   Collection Time: 06/22/16  4:28 PM  Result Value Ref Range   Procalcitonin 53.03 ng/mL    Comment:        Interpretation: PCT >= 10 ng/mL: Important systemic inflammatory response, almost exclusively due to severe bacterial sepsis or septic shock. (NOTE)         ICU PCT Algorithm               Non ICU PCT Algorithm    ----------------------------     ------------------------------         PCT < 0.25 ng/mL                 PCT < 0.1 ng/mL     Stopping of antibiotics            Stopping of antibiotics       strongly encouraged.               strongly encouraged.    ----------------------------     ------------------------------       PCT level decrease by               PCT < 0.25 ng/mL       >= 80% from peak PCT       OR PCT 0.25 - 0.5 ng/mL          Stopping of antibiotics  encouraged.     Stopping of antibiotics           encouraged.    ----------------------------     ------------------------------       PCT level decrease by              PCT >= 0.25 ng/mL       < 80% from peak PCT        AND PCT >= 0.5 ng/mL             Continuing antibiotics                                              encouraged.       Continuing antibiotics            encouraged.    ----------------------------     ------------------------------     PCT level increase compared          PCT > 0.5 ng/mL         with peak PCT AND          PCT >= 0.5 ng/mL             Escalation of antibiotics                                          strongly encouraged.      Escalation of antibiotics        strongly encouraged.   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 No evidence of left carotid stenosis following prior endarterectomy.   Amelia Burgard A. Merlene Laughter, M.D.  Diplomate, Tax adviser of Psychiatry and Neurology ( Neurology). 06/22/2016, 5:47 PM

## 2016-06-22 NOTE — Progress Notes (Addendum)
[facility] PROGRESS NOTE  Lucas Morgan GNF:[id number] DOB: 08/27/1941 DOA: 06/21/2016   [facility]     Assessment/Plan: Principal Problem:   Altered mental status Active Problems:   Acute on chronic renal failure (HCC)   Chronic pain syndrome   Type 2 diabetes mellitus with stage 2 chronic kidney disease, with long-term current use of insulin (HCC)   HLD (hyperlipidemia)   Hypomagnesemia   Elevated troponin   AKI (acute kidney injury) (HCC)   75 y.o. male with medical history significant of *CVA, dementia, CKD, DM, CAD, CHF presenting with AMS.  At the time of my evaluation, he was alone in the room and unable to provide appropriate history. CBG elevated 584. Creatinine 1.88. White count 14.1. CT head did not show any evidence of intracranial abnormality.  Assessment and plan   AMS/Acute encephalopathy -Suspicion of acute stroke, patient has an implantable loop recorder, therefore holding off on MRI of the brain -Patient with apparent acute/subacute onset of hallucinations, delirium -Initial thought by ED physician was that hyperosmolar hyperglycemic syndrome was most likely etiology and so she started Glucostabilizer protocol -UDS except for opiates, UA negative Patient noted to have slurred speech, has had previous strokes, not sure if this is the patient's baseline Neurology has been consulted for further evaluation Patient has seen [facility] in the past, has an EF of 30-35% on TEE in 2016 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 Will repeat 2-D echo and carotid Doppler Will consider repeat CT scan tomorrow if symptoms persist Continue aspirin and Plavix  Hypomagnesemia-repleted  Hypokalemia-replete    Acute kidney injury on CKD, stage II -Improving with IVF -Continue to follow  DM, HHS -Patient markedly hyperglycemic on admission but normal gap and no acidosis -Patient was  started on the Glucostabilizer protocol -His glucose has normalized and I have written transition orders (as per order set) - switch to Levemir (Lantus is not part of order set) and sliding scale insulin -Based on rapid improvement in glucose without impact on sensorium, it seems unlikely that this was the cause of his encephalopathy Continue SQ today, consider transferring patient out to telemetry tomorrow    Elevated troponin -May be related to AKI, but as creatinine is improving, troponin is mildly increasing -Patient without c/o chest pain, but not sure how reliable this is -EKG is negative for acute change -Will continue to trend troponin and monitor on telemetry in SDU -If ongoing troponin elevation, will need to consider cardiology consultation -, 2-D echo pending Mild elevation could also be secondary to ischemic cardiomyopathy Patient hypotensive on arrival, blood pressure borderline, continue Coreg,    Chronic pain -I attempted to review this patient in the [place] Controlled Substances Reporting System.  I was unable to locate him.  Leukocytosis/probable sepsis - Unclear source, focused sepsis order set initiated ,started broad spectrum abx, BC times 2 chest x-ray negative, white count persists, patient is afebrile, repeat chest x-ray, speech therapy evaluation   DVT prophylaxsis Lovenox  Code Status:  Full code   Family Communication: Discussed in detail with the patient, all imaging results, lab results explained to the patient   Disposition Plan: Continue stepdown, monitor CBGs closely, neurochecks,     Consultants:  Neurology    Procedures:  None  Antibiotics: Anti-infectives    Start     Dose/Rate Route Frequency Ordered Stop   06/22/16 1400  cefTRIAXone (ROCEPHIN) 1 g in dextrose 5 %  50 mL IVPB     1 g 100 mL/hr over 30 Minutes Intravenous Every 24 hours 06/21/16 1950     06/21/16 1430  cefTRIAXone (ROCEPHIN) 1 g in dextrose 5 % 50 mL IVPB     1  g 100 mL/hr over 30 Minutes Intravenous  Once 06/21/16 1427 06/21/16 1649         HPI/Subjective: Patient has dysarthria, unable to speak in full sentences but awake  Objective: Vitals:   06/22/16 0730 06/22/16 0800 06/22/16 0830 06/22/16 0834  BP: 96/61 92/61 127/75   Pulse: 83 86 96   Resp: 17 14 (!) 30   Temp:    97.8 F (36.6 C)  TempSrc:    Axillary  SpO2: 96% 97% 99%   Weight:      Height:        Intake/Output Summary (Last 24 hours) at 06/22/16 0930 Last data filed at 06/22/16 0649  Gross per 24 hour  Intake            758.7 ml  Output              350 ml  Net            408.7 ml    Exam:  Examination:  General exam: Appears calm and comfortable  Respiratory system: Clear to auscultation. Respiratory effort normal. Cardiovascular system: S1 & S2 heard, RRR. No JVD, murmurs, rubs, gallops or clicks. No pedal edema. Gastrointestinal system: Abdomen is nondistended, soft and nontender. No organomegaly or masses felt. Normal bowel sounds heard. Central nervous system: Alert and oriented.Noted to have dysarthria Extremities: Symmetric 5 x 5 power. Skin: No rashes, lesions or ulcers Psychiatry: Judgement and insight appear normal. Mood & affect appropriate.     Data Reviewed: I have personally reviewed following labs and imaging studies  Micro Results Recent Results (from the past 240 hour(s))  MRSA PCR Screening     Status: Abnormal   Collection Time: 06/21/16  4:35 PM  Result Value Ref Range Status   MRSA by PCR POSITIVE (A) NEGATIVE Final    Comment:        The GeneXpert MRSA Assay (FDA approved for NASAL specimens only), is one component of a comprehensive MRSA colonization surveillance program. It is not intended to diagnose MRSA infection nor to guide or monitor treatment for MRSA infections. RESULT CALLED TO, READ BACK BY AND VERIFIED WITH: HEARN,J ON 06/21/16 AT 2300 BY LOY,C     Radiology Reports Dg Chest 2 View  Result Date:  06/21/2016 CLINICAL DATA:  Altered mental status.  The patient fell today. EXAM: CHEST  2 VIEW COMPARISON:  04/09/2016 FINDINGS: The heart size and mediastinal contours are within normal limits. Chronic accentuation of the interstitial markings. No acute infiltrates or effusions. No acute bone abnormality. IMPRESSION: No acute abnormality.  Chronic interstitial disease. Electronically Signed   By: Francene Boyers M.D.   On: 06/21/2016 13:01   Ct Head Wo Contrast  Result Date: 06/21/2016 CLINICAL DATA:  Altered mental status. Fall. Posterior head injury. EXAM: CT HEAD WITHOUT CONTRAST TECHNIQUE: Contiguous axial images were obtained from the base of the skull through the vertex without intravenous contrast. COMPARISON:  01/13/2016 head CT. FINDINGS: Brain: No evidence of parenchymal hemorrhage or extra-axial fluid collection. No mass lesion, mass effect, or midline shift. No CT evidence of acute infarction. Stable encephalomalacia in the left frontal and left occipital lobes. Intracranial atherosclerosis. Nonspecific mild to moderate subcortical and periventricular white matter hypodensity, most in keeping  with chronic small vessel ischemic change. Underlying generalized cerebral volume loss. Cerebral ventricle sizes are stable and concordant with the degree of cerebral volume loss. Vascular: No hyperdense vessel or unexpected calcification. Skull: No evidence of calvarial fracture. Sinuses/Orbits: The visualized paranasal sinuses are essentially clear. Stable chronic atrophy and calcification of the left globe. Other:  The mastoid air cells are unopacified. IMPRESSION: 1. No evidence of acute intracranial abnormality. No evidence of calvarial fracture. 2. Stable left frontal and left occipital lobe encephalomalacia. 3. Mild-to-moderate chronic small vessel ischemia. Electronically Signed   By: Delbert Phenix M.D.   On: 06/21/2016 13:08     CBC  Recent Labs Lab 06/21/16 1224 06/22/16 0150  WBC 14.1* 15.8*   HGB 14.0 13.0  HCT 39.5 36.4*  PLT 194 207  MCV 92.1 90.8  MCH 32.6 32.4  MCHC 35.4 35.7  RDW 12.9 12.6  LYMPHSABS 1.5  --   MONOABS 1.5*  --   EOSABS 0.0  --   BASOSABS 0.0  --     Chemistries   Recent Labs Lab 06/21/16 1218 06/21/16 1235 06/21/16 1953 06/22/16 0150  NA 131*  --  136 135  K 4.4  --  3.2* 3.3*  CL 96*  --  104 107  CO2 22  --  22 22  GLUCOSE 584*  --  146* 103*  BUN 33*  --  27* 27*  CREATININE 1.88*  --  1.36* 1.33*  CALCIUM 9.2  --  8.6* 8.7*  MG  --  1.1*  --   --   AST 28  --   --   --   ALT 18  --   --   --   ALKPHOS 79  --   --   --   BILITOT 1.7*  --   --   --    ------------------------------------------------------------------------------------------------------------------ estimated creatinine clearance is 46.9 mL/min (by C-G formula based on SCr of 1.33 mg/dL (H)). ------------------------------------------------------------------------------------------------------------------ No results for input(s): HGBA1C in the last 72 hours. ------------------------------------------------------------------------------------------------------------------ No results for input(s): CHOL, HDL, LDLCALC, TRIG, CHOLHDL, LDLDIRECT in the last 72 hours. ------------------------------------------------------------------------------------------------------------------ No results for input(s): TSH, T4TOTAL, T3FREE, THYROIDAB in the last 72 hours.  Invalid input(s): FREET3 ------------------------------------------------------------------------------------------------------------------ No results for input(s): VITAMINB12, FOLATE, FERRITIN, TIBC, IRON, RETICCTPCT in the last 72 hours.  Coagulation profile No results for input(s): INR, PROTIME in the last 168 hours.  No results for input(s): DDIMER in the last 72 hours.  Cardiac Enzymes  Recent Labs Lab 06/21/16 1953 06/22/16 0150 06/22/16 0805  TROPONINI 0.10* 0.24* 0.23*    ------------------------------------------------------------------------------------------------------------------ Invalid input(s): POCBNP   CBG:  Recent Labs Lab 06/21/16 2103 06/22/16 0008 06/22/16 0413 06/22/16 0722 06/22/16 0802  GLUCAP 156* 183* 73 60* 109*       Studies: Dg Chest 2 View  Result Date: 06/21/2016 CLINICAL DATA:  Altered mental status.  The patient fell today. EXAM: CHEST  2 VIEW COMPARISON:  04/09/2016 FINDINGS: The heart size and mediastinal contours are within normal limits. Chronic accentuation of the interstitial markings. No acute infiltrates or effusions. No acute bone abnormality. IMPRESSION: No acute abnormality.  Chronic interstitial disease. Electronically Signed   By: Francene Boyers M.D.   On: 06/21/2016 13:01   Ct Head Wo Contrast  Result Date: 06/21/2016 CLINICAL DATA:  Altered mental status. Fall. Posterior head injury. EXAM: CT HEAD WITHOUT CONTRAST TECHNIQUE: Contiguous axial images were obtained from the base of the skull through the vertex without intravenous contrast. COMPARISON:  01/13/2016 head CT.  FINDINGS: Brain: No evidence of parenchymal hemorrhage or extra-axial fluid collection. No mass lesion, mass effect, or midline shift. No CT evidence of acute infarction. Stable encephalomalacia in the left frontal and left occipital lobes. Intracranial atherosclerosis. Nonspecific mild to moderate subcortical and periventricular white matter hypodensity, most in keeping with chronic small vessel ischemic change. Underlying generalized cerebral volume loss. Cerebral ventricle sizes are stable and concordant with the degree of cerebral volume loss. Vascular: No hyperdense vessel or unexpected calcification. Skull: No evidence of calvarial fracture. Sinuses/Orbits: The visualized paranasal sinuses are essentially clear. Stable chronic atrophy and calcification of the left globe. Other:  The mastoid air cells are unopacified. IMPRESSION: 1. No evidence  of acute intracranial abnormality. No evidence of calvarial fracture. 2. Stable left frontal and left occipital lobe encephalomalacia. 3. Mild-to-moderate chronic small vessel ischemia. Electronically Signed   By: Delbert Phenix M.D.   On: 06/21/2016 13:08      Lab Results  Component Value Date   HGBA1C 8.6 (H) 01/13/2016   HGBA1C 7.5 (H) 05/25/2015   HGBA1C (H) 12/21/2009    10.4 (NOTE) The ADA recommends the following therapeutic goal for glycemic control related to Hgb A1c measurement: Goal of therapy: <6.5 Hgb A1c  Reference: American Diabetes Association: Clinical Practice Recommendations 2010, Diabetes Care, 2010, 33: (Suppl  1).   Lab Results  Component Value Date   LDLCALC 29 01/16/2016   CREATININE 1.33 (H) 06/22/2016       Scheduled Meds: . aspirin  81 mg Oral Daily  . atorvastatin  80 mg Oral q1800  . carvedilol  3.125 mg Oral BID WC  . cefTRIAXone (ROCEPHIN)  IV  1 g Intravenous Q24H  . Chlorhexidine Gluconate Cloth  6 each Topical Q0600  . citalopram  20 mg Oral Daily  . clopidogrel  75 mg Oral Daily  . enoxaparin (LOVENOX) injection  40 mg Subcutaneous Q24H  . gabapentin  300 mg Oral TID  . insulin aspart  0-24 Units Subcutaneous Q4H  . insulin detemir  20 Units Subcutaneous QHS  . mupirocin ointment  1 application Nasal BID  . pantoprazole  40 mg Oral Daily  . sodium chloride flush  3 mL Intravenous Q12H  . tamsulosin  0.4 mg Oral Daily   Continuous Infusions:    LOS: 0 days    Time spent: >30 MINS    [facility]  [facility] Pager [phone]. If 7PM-7AM, please contact night-coverage at [email or web address], password [email or web address] 06/22/2016, 9:30 AM  LOS: 0 days

## 2016-06-22 NOTE — Clinical Social Work Note (Signed)
Clinical Social Work Assessment  Patient Details  Name: Lucas EatonJohn C Morgan MRN: [id number] Date of Birth: 08/11/1941  Date of referral:  06/22/16               Reason for consult:  Facility Placement                Permission sought to share information with:    Permission granted to share information::     Name::        Agency::     Relationship::     Contact Information:     Housing/Transportation Living arrangements for the past 2 months:  Single Family Home Source of Information:  Adult Children Patient Interpreter Needed:  None Criminal Activity/Legal Involvement Pertinent to Current Situation/Hospitalization:  No - Comment as needed Significant Relationships:  None Lives with:  Adult Children Do you feel safe going back to the place where you live?  Yes Need for family participation in patient care:  Yes (Comment)  Care giving concerns:  Daughter reports that she provides care for patient.    Social Worker assessment / plan:  Patient's daughter, Lucas Morgan, stated that patient lives with her, falls a lot and is forgetful.  She assists him with his ADLs.  CSW discussed referral reason being SNF.  CSW discussed the need to get patient a PT evaluation to determine need for insurance purposes. Ms. Lucas Morgan advised that [facility] is [facility] [place] [facility] [place].   Employment status:  Retired Database administratornsurance information:  Managed Medicare PT Recommendations:  Not assessed at this time Information / Referral to community resources:     Patient/Family's Response to care:  Daughter is agreeable for patient to go to SNF if PT recommends.   Patient/Family's Understanding of and Emotional Response to Diagnosis, Current Treatment, and Prognosis:  Ms .Lucas Morgan understands patient's diagnosis, treatment and prognosis.   Emotional Assessment Appearance:  Appears stated age Attitude/Demeanor/Rapport:  Unable to Assess Affect (typically observed):  Unable to  Assess Orientation:  Oriented to Self Alcohol / Substance use:  Not Applicable Psych involvement (Current and /or in the community):  No (Comment)  Discharge Needs  Concerns to be addressed:  Discharge Planning Concerns Readmission within the last 30 days:  No Current discharge risk:  None Barriers to Discharge:  No Barriers Identified   Lucas Morgan, Lucas Chenier D, LCSW 06/22/2016, 11:44 AM

## 2016-06-23 ENCOUNTER — Inpatient Hospital Stay (HOSPITAL_COMMUNITY): Payer: Medicare Other

## 2016-06-23 DIAGNOSIS — R748 Abnormal levels of other serum enzymes: Secondary | ICD-10-CM | POA: Diagnosis not present

## 2016-06-23 DIAGNOSIS — J849 Interstitial pulmonary disease, unspecified: Secondary | ICD-10-CM | POA: Diagnosis present

## 2016-06-23 DIAGNOSIS — N179 Acute kidney failure, unspecified: Secondary | ICD-10-CM

## 2016-06-23 DIAGNOSIS — R4182 Altered mental status, unspecified: Secondary | ICD-10-CM | POA: Diagnosis present

## 2016-06-23 DIAGNOSIS — E785 Hyperlipidemia, unspecified: Secondary | ICD-10-CM | POA: Diagnosis present

## 2016-06-23 DIAGNOSIS — I959 Hypotension, unspecified: Secondary | ICD-10-CM | POA: Diagnosis present

## 2016-06-23 DIAGNOSIS — G894 Chronic pain syndrome: Secondary | ICD-10-CM | POA: Diagnosis not present

## 2016-06-23 DIAGNOSIS — E1122 Type 2 diabetes mellitus with diabetic chronic kidney disease: Secondary | ICD-10-CM | POA: Diagnosis present

## 2016-06-23 DIAGNOSIS — Z794 Long term (current) use of insulin: Secondary | ICD-10-CM

## 2016-06-23 DIAGNOSIS — Z9660 Presence of unspecified orthopedic joint implant: Secondary | ICD-10-CM | POA: Diagnosis present

## 2016-06-23 DIAGNOSIS — N183 Chronic kidney disease, stage 3 (moderate): Secondary | ICD-10-CM | POA: Diagnosis present

## 2016-06-23 DIAGNOSIS — I252 Old myocardial infarction: Secondary | ICD-10-CM | POA: Diagnosis not present

## 2016-06-23 DIAGNOSIS — Z981 Arthrodesis status: Secondary | ICD-10-CM | POA: Diagnosis not present

## 2016-06-23 DIAGNOSIS — G9340 Encephalopathy, unspecified: Secondary | ICD-10-CM | POA: Diagnosis not present

## 2016-06-23 DIAGNOSIS — N182 Chronic kidney disease, stage 2 (mild): Secondary | ICD-10-CM

## 2016-06-23 DIAGNOSIS — Z8673 Personal history of transient ischemic attack (TIA), and cerebral infarction without residual deficits: Secondary | ICD-10-CM | POA: Diagnosis not present

## 2016-06-23 DIAGNOSIS — E876 Hypokalemia: Secondary | ICD-10-CM | POA: Diagnosis present

## 2016-06-23 DIAGNOSIS — I5042 Chronic combined systolic (congestive) and diastolic (congestive) heart failure: Secondary | ICD-10-CM | POA: Diagnosis present

## 2016-06-23 DIAGNOSIS — G92 Toxic encephalopathy: Secondary | ICD-10-CM | POA: Diagnosis present

## 2016-06-23 DIAGNOSIS — N390 Urinary tract infection, site not specified: Secondary | ICD-10-CM | POA: Diagnosis present

## 2016-06-23 DIAGNOSIS — Z23 Encounter for immunization: Secondary | ICD-10-CM | POA: Diagnosis not present

## 2016-06-23 DIAGNOSIS — I248 Other forms of acute ischemic heart disease: Secondary | ICD-10-CM | POA: Diagnosis present

## 2016-06-23 DIAGNOSIS — I4891 Unspecified atrial fibrillation: Secondary | ICD-10-CM | POA: Diagnosis present

## 2016-06-23 DIAGNOSIS — M810 Age-related osteoporosis without current pathological fracture: Secondary | ICD-10-CM | POA: Diagnosis present

## 2016-06-23 DIAGNOSIS — Z955 Presence of coronary angioplasty implant and graft: Secondary | ICD-10-CM | POA: Diagnosis not present

## 2016-06-23 DIAGNOSIS — F0390 Unspecified dementia without behavioral disturbance: Secondary | ICD-10-CM | POA: Diagnosis present

## 2016-06-23 DIAGNOSIS — E1100 Type 2 diabetes mellitus with hyperosmolarity without nonketotic hyperglycemic-hyperosmolar coma (NKHHC): Secondary | ICD-10-CM | POA: Diagnosis present

## 2016-06-23 DIAGNOSIS — I2510 Atherosclerotic heart disease of native coronary artery without angina pectoris: Secondary | ICD-10-CM | POA: Diagnosis present

## 2016-06-23 LAB — ECHOCARDIOGRAM COMPLETE
AO mean calculated velocity dopler: 120 cm/s
AOPV: 0.54 m/s
AOVTI: 38.4 cm
AV Mean grad: 7 mmHg
AV Peak grad: 12 mmHg
AV area mean vel ind: 1.04 cm2/m2
AV peak Index: 1.14
AV pk vel: 176 cm/s
AV vel: 1.88
AVAREAMEANV: 1.89 cm2
AVAREAVTI: 2.06 cm2
AVAREAVTIIND: 1.04 cm2/m2
AVCELMEANRAT: 0.5
CHL CUP DOP CALC LVOT VTI: 19 cm
CHL CUP STROKE VOLUME: 29 mL
E/e' ratio: 13.16
EWDT: 190 ms
FS: 14 % — AB (ref 28–44)
Height: 68 in
IV/PV OW: 1.03
LA ID, A-P, ES: 45 mm
LA diam end sys: 45 mm
LA diam index: 2.49 cm/m2
LA vol index: 34.5 mL/m2
LA vol: 62.4 mL
LAVOLA4C: 53.7 mL
LV E/e' medial: 13.16
LV E/e'average: 13.16
LV PW d: 12 mm — AB (ref 0.6–1.1)
LV SIMPSON'S DISK: 36
LV TDI E'MEDIAL: 5.11
LV dias vol: 79 mL (ref 62–150)
LV sys vol index: 28 mL/m2
LVDIAVOLIN: 44 mL/m2
LVELAT: 4.46 cm/s
LVOT area: 3.8 cm2
LVOT peak grad rest: 4 mmHg
LVOTD: 22 mm
LVOTPV: 95.3 cm/s
LVOTSV: 72 mL
LVOTVTI: 0.49 cm
LVSYSVOL: 50 mL (ref 21–61)
MV Dec: 190
MV pk E vel: 58.7 m/s
MVPKAVEL: 126 m/s
RV LATERAL S' VELOCITY: 11.4 cm/s
RV TAPSE: 18 mm
RV sys press: 27 mmHg
Reg peak vel: 244 cm/s
TDI e' lateral: 4.46
TR max vel: 244 cm/s
Valve area index: 1.04
Valve area: 1.88 cm2
Weight: 2402.13 oz

## 2016-06-23 LAB — CBC
HEMATOCRIT: 29.9 % — AB (ref 39.0–52.0)
HEMOGLOBIN: 10.5 g/dL — AB (ref 13.0–17.0)
MCH: 32.3 pg (ref 26.0–34.0)
MCHC: 35.1 g/dL (ref 30.0–36.0)
MCV: 92 fL (ref 78.0–100.0)
Platelets: 158 10*3/uL (ref 150–400)
RBC: 3.25 MIL/uL — ABNORMAL LOW (ref 4.22–5.81)
RDW: 12.8 % (ref 11.5–15.5)
WBC: 9.8 10*3/uL (ref 4.0–10.5)

## 2016-06-23 LAB — GLUCOSE, CAPILLARY
GLUCOSE-CAPILLARY: 69 mg/dL (ref 65–99)
GLUCOSE-CAPILLARY: 154 mg/dL — AB (ref 65–99)
GLUCOSE-CAPILLARY: 216 mg/dL — AB (ref 65–99)
Glucose-Capillary: 120 mg/dL — ABNORMAL HIGH (ref 65–99)
Glucose-Capillary: 229 mg/dL — ABNORMAL HIGH (ref 65–99)

## 2016-06-23 LAB — COMPREHENSIVE METABOLIC PANEL
ALBUMIN: 2.6 g/dL — AB (ref 3.5–5.0)
ALT: 16 U/L — ABNORMAL LOW (ref 17–63)
ANION GAP: 4 — AB (ref 5–15)
AST: 28 U/L (ref 15–41)
Alkaline Phosphatase: 48 U/L (ref 38–126)
BUN: 25 mg/dL — AB (ref 6–20)
CHLORIDE: 108 mmol/L (ref 101–111)
CO2: 23 mmol/L (ref 22–32)
Calcium: 7.6 mg/dL — ABNORMAL LOW (ref 8.9–10.3)
Creatinine, Ser: 1.43 mg/dL — ABNORMAL HIGH (ref 0.61–1.24)
GFR calc Af Amer: 54 mL/min — ABNORMAL LOW (ref >60)
GFR, EST NON AFRICAN AMERICAN: 47 mL/min — AB (ref >60)
Glucose, Bld: 166 mg/dL — ABNORMAL HIGH (ref 65–99)
POTASSIUM: 3.4 mmol/L — AB (ref 3.5–5.1)
Sodium: 135 mmol/L (ref 135–145)
Total Bilirubin: 0.7 mg/dL (ref 0.3–1.2)
Total Protein: 5.1 g/dL — ABNORMAL LOW (ref 6.5–8.1)

## 2016-06-23 LAB — HEMOGLOBIN A1C
HEMOGLOBIN A1C: 8.2 % — AB (ref 4.8–5.6)
Mean Plasma Glucose: 189 mg/dL

## 2016-06-23 LAB — URINE CULTURE: Culture: NO GROWTH

## 2016-06-23 LAB — BRAIN NATRIURETIC PEPTIDE: B NATRIURETIC PEPTIDE 5: 105 pg/mL — AB (ref 0.0–100.0)

## 2016-06-23 LAB — TROPONIN I: Troponin I: 0.13 ng/mL (ref <0.03)

## 2016-06-23 MED ORDER — INSULIN ASPART 100 UNIT/ML ~~LOC~~ SOLN
0.0000 [IU] | Freq: Three times a day (TID) | SUBCUTANEOUS | Status: DC
  Administered 2016-06-23 – 2016-06-24 (×3): 5 [IU] via SUBCUTANEOUS
  Administered 2016-06-24: 3 [IU] via SUBCUTANEOUS
  Administered 2016-06-24: 8 [IU] via SUBCUTANEOUS

## 2016-06-23 MED ORDER — INSULIN GLARGINE 100 UNIT/ML ~~LOC~~ SOLN
10.0000 [IU] | Freq: Every day | SUBCUTANEOUS | Status: DC
  Administered 2016-06-23: 10 [IU] via SUBCUTANEOUS
  Filled 2016-06-23: qty 0.1

## 2016-06-23 MED ORDER — INSULIN ASPART 100 UNIT/ML ~~LOC~~ SOLN
0.0000 [IU] | Freq: Every day | SUBCUTANEOUS | Status: DC
  Administered 2016-06-23: 2 [IU] via SUBCUTANEOUS
  Administered 2016-06-24: 3 [IU] via SUBCUTANEOUS

## 2016-06-23 MED ORDER — POTASSIUM CHLORIDE CRYS ER 20 MEQ PO TBCR
20.0000 meq | EXTENDED_RELEASE_TABLET | Freq: Two times a day (BID) | ORAL | Status: DC
  Administered 2016-06-23 – 2016-06-25 (×5): 20 meq via ORAL
  Filled 2016-06-23 (×5): qty 1

## 2016-06-23 NOTE — Evaluation (Signed)
Physical Therapy Evaluation Patient Details Name: Lucas EatonJohn C Morgan MRN: [id number] DOB: 03/01/1941 Today's Date: 06/23/2016   History of Present Illness  Patient is a 75 year old man with a history of previous left brain CVA, dementia, CKD, chronic pain-on chronic opiates, combined CHF, CAD, carotid artery disease, and diabetes mellitus, who presented on 06/21/16 with altered mental status and visual hallucinations. Pt also reported that he had a fall.  In the ED, he was tachycardic, his venous glucose was 583. His urine drug screen was positive for opiates. His UA revealed many bacteria. Troponin I was slightly elevated at 0.06 and elevated up to 0.24, but now trending back down at 0.13 - this is likely from infection and acute renal failure. Head CT revealed no acute intracranial abnormality, but with stable left frontal and left occipital lobe encephalomalacia and chronic small vessel disease. Chest x-ray (-) for acute process. He was admitted for further evaluation and management.  Dx: AMS/acute encephalopathy, DM2 with hyperosmolar hyperglycemia, UTI.  Clinical Impression  Pt received in bed, and was agreeable to PT evaluation.  No family present, and pt is not a reliable historian, therefore, PLOF information obtained from chart review.  During PT evaluation today, he required supervision for supine<>sit, and Min guard for sit<>stand.  He ambulated 5540ft with RW and Min A due to 1 moderate LOB which required assist from PT to prevent pt from falling.  Pt has a significant history of falls, and demonstrated poor balance today.  He is at high risk for future falls.  During his last admission in May, 2017 he was recommended for SNF.  Will continue to recommend SNF due to pt needing assistance at all times when mobilizing.      Follow Up Recommendations SNF    Equipment Recommendations  None recommended by PT    Recommendations for Other Services       Precautions / Restrictions  Precautions Precautions: Fall Precaution Comments: Pt reports falling just prior to admission.  Pt also has a history of falling.   Restrictions Weight Bearing Restrictions: No      Mobility  Bed Mobility Overal bed mobility: Needs Assistance Bed Mobility: Supine to Sit     Supine to sit: Supervision;HOB elevated        Transfers Overall transfer level: Needs assistance Equipment used: Rolling walker (2 wheeled) Transfers: Sit to/from Stand Sit to Stand: Min guard (vc's for hand placement)            Ambulation/Gait Ambulation/Gait assistance: Min guard;Min assist Ambulation Distance (Feet): 40 Feet Assistive device: Rolling walker (2 wheeled) Gait Pattern/deviations: Step-through pattern;Decreased dorsiflexion - left   Gait velocity interpretation: <1.8 ft/sec, indicative of risk for recurrent falls General Gait Details: Pt demonstrated 1 moderated LOB which required Min A from PT to prevent falling.    Stairs            Wheelchair Mobility    Modified Rankin (Stroke Patients Only)       Balance Overall balance assessment: Needs assistance Sitting-balance support: Bilateral upper extremity supported Sitting balance-Leahy Scale: Good     Standing balance support: Bilateral upper extremity supported Standing balance-Leahy Scale: Fair                               Pertinent Vitals/Pain Pain Assessment: 0-10 Faces Pain Scale: Hurts even more Pain Location: back/tailbone  Pain Descriptors / Indicators: Constant Pain Intervention(s): Limited activity within patient's tolerance;Repositioned;Monitored during session  Home Living   Living Arrangements: Children (Pt's dtr lives with him. )   Type of Home: House Home Access: Ramped entrance     Home Layout: One level Home Equipment: Walker - 2 wheels;Bedside commode;Shower seat;Wheelchair - manual;Electric scooter;Grab bars - tub/shower      Prior Function Level of Independence:  Needs assistance   Gait / Transfers Assistance Needed: Pt reports using a RW for ambulation PTA.    ADL's / Homemaking Assistance Needed: Prior to admission in May 2017, he reportedly was independent with ADL's.  Dtr was assisting with meal prep, household tasks, and medication management.    Comments: Prior to admission in May 2017, pt reported that he was driving.      Hand Dominance   Dominant Hand: Right    Extremity/Trunk Assessment   Upper Extremity Assessment: Generalized weakness           Lower Extremity Assessment: LLE deficits/detail   LLE Deficits / Details: decreased DF strength noted with slight foot drop during gait      Communication   Communication: Expressive difficulties  Cognition Arousal/Alertness: Awake/alert Behavior During Therapy: WFL for tasks assessed/performed Overall Cognitive Status: Impaired/Different from baseline                      General Comments      Exercises     Assessment/Plan    PT Assessment Patient needs continued PT services  PT Problem List Decreased strength;Decreased activity tolerance;Decreased balance;Decreased mobility;Decreased coordination;Decreased knowledge of use of DME;Decreased safety awareness;Decreased knowledge of precautions          PT Treatment Interventions DME instruction;Gait training;Functional mobility training;Therapeutic activities;Therapeutic exercise;Balance training;Patient/family education    PT Goals (Current goals can be found in the Care Plan section)  Acute Rehab PT Goals Patient Stated Goal: None stated Time For Goal Achievement: 06/30/16 Potential to Achieve Goals: Fair    Frequency Min 3X/week   Barriers to discharge        Co-evaluation               End of Session Equipment Utilized During Treatment: Gait belt Activity Tolerance: Patient limited by fatigue Patient left: in chair;with call bell/phone within reach Nurse Communication: Mobility status  Lyman Bishop, RN notified of pt's mobility status)    Functional Assessment Tool Used: [employer] "6-clicks"  Functional Limitation: Mobility: Walking and moving around Mobility: Walking and Moving Around Current Status [phone]): At least 20 percent but less than 40 percent impaired, limited or restricted Mobility: Walking and Moving Around Goal Status [phone]): At least 1 percent but less than 20 percent impaired, limited or restricted    Time: 1027-1052 PT Time Calculation (min) (ACUTE ONLY): 25 min   Charges:   PT Evaluation $PT Eval Moderate Complexity: 1 Procedure PT Treatments $Gait Training: 8-22 mins   PT G Codes:   PT G-Codes **NOT FOR INPATIENT CLASS** Functional Assessment Tool Used: [employer] "6-clicks"  Functional Limitation: Mobility: Walking and moving around Mobility: Walking and Moving Around Current Status [phone]): At least 20 percent but less than 40 percent impaired, limited or restricted Mobility: Walking and Moving Around Goal Status [phone]): At least 1 percent but less than 20 percent impaired, limited or restricted   Beth Destry Bezdek, PT, DPT X: [phone]

## 2016-06-23 NOTE — Progress Notes (Signed)
PROGRESS NOTE    Lucas Morgan  ZOX:[id number] DOB: 02-May-1941 DOA: 06/21/2016 PCP: [facility]    Brief Narrative:  Patient is a 75 year old man with a history of previous left brain CVA, dementia, CKD, chronic pain-on chronic opiates, combined CHF, CAD, carotid artery disease, and diabetes mellitus, who presented on 06/21/16 with altered mental status and visual hallucinations. In the ED, he was tachycardic, but with a normal blood pressure. He was afebrile. His venous glucose was 583. His anion gap was normal. His urine drug screen was positive for opiates. His UA revealed many bacteria. His white blood cell count was 14.1. Troponin I was slightly elevated at 0.06. Sodium was 131. Head CT revealed no acute intracranial abnormality, but with stable left frontal and left occipital lobe encephalomalacia and chronic small vessel disease. Chest x-ray revealed no acute abnormality, but with chronic interstitial disease. He was admitted for further evaluation and management.   Assessment & Plan:   Principal Problem:   Altered mental status Active Problems:   Acute on chronic renal failure (HCC)   Chronic pain syndrome   Type 2 diabetes mellitus with stage 2 chronic kidney disease, with long-term current use of insulin (HCC)   HLD (hyperlipidemia)   Hypomagnesemia   Elevated troponin   AKI (acute kidney injury) (HCC)   1. Altered mental status/acute encephalopathy with reported hallucinations. The patient was started on supportive treatment and gentle IV fluids and treatment for accelerated hyperglycemia. Rocephin was started empirically for what was thought to be a UTI. Diagnostic studies revealed an echo with an EF of 35-40% which is virtually stable compared to TEE 2016. Carotid ultrasound reveals stable 50-69% right ICA stenosis/no left carotid artery stenosis following prior endarterectomy. Neurology was consulted and believed that the patient's encephalopathy was  likely from severe hyperglycemia and volume depletion. -With treatment of his hyperglycemia, the patient's encephalopathy has resolved. This was the likely etiology of his acute encephalopathy, however, will order a TSH and vitamin B12 level to rule out deficiencies.  History of multiple cortical infarcts. Patient had a recent loop recorder which failed to reveal atrial fibrillation. He was continued on Plavix and aspirin.  Type 2 diabetes with hyperosmolar hyperglycemia. The patient is treated with metformin and Lantus chronically. His venous glucose was greater than 580 on admission. He was started on the blue commander protocol. His blood glucose improved quickly. His CBGs started running on the low side of normal. -His A1c was found to be 8.2. -We'll de-escalate sliding-scale insulin, discontinue Levemir, and start Lantus at a lower dose daily at bedtime.  UTI. The patient's urinalysis revealed many bacteria. His white blood cell count was 15.8 on admission.  He was started on Rocephin which was eventually broadened to Zosyn and vancomycin empirically. -Blood cultures are negative to date. Urine culture is pending. MRSA screen was positive.  -His white blood cell count has improved and he remains afebrile. -I do not believe the patient had sepsis on admission or now. -Continue current management with the intention of narrowing antibiotics soon.  Elevated troponin I in the setting of CAD and chronic combined CHF.  Patient's troponin I was 0.10 on admission and reached a high of 0.23. It has since trended down. Patient had no endorsement of chest pain. Echo noted for known reduced EF. EKG reveals stable right bundle branch block but with a few PVCs. -Patient was continued on Plavix, aspirin, and carvedilol. Lisinopril was held due to AKI. -The etiology of the  elevation was likely from infection and acute on chronic renal failure.  Acute kidney injury superimposed on stage III chronic  kidney disease. Per chart review, the patient's baseline creatinine appears to range from 1.3-1.4. It was 1.88 on admission. -Lisinopril was withheld. He was started on gentle IV fluids. His creatinine has improved to baseline.  Hypokalemia. Patient's serum potassium was 3.2 on admission. He was given potassium chloride. -We'll continue potassium chloride supplementation as his serum potassium is still low.  Chronic pain syndrome. Percocet every 6 hours as needed was restarted. Currently stable.  Pulmonary crackles Due to of the pulmonary crackles auscultated on exam, will order a chest x-ray. -Decrease IV fluids as his blood pressure tolerates it.     DVT prophylaxis: Lovenox Code Status: Full code Family Communication: Family not available Disposition Plan: Discharge when clinically appropriate; possibly need for SNF placement.   Consultants:   Neurology  Procedures:   -2-D echo 06/22/16:Study Conclusions - Left ventricle: The cavity size was normal. There was mild   concentric hypertrophy. Systolic function was moderately reduced.   The estimated ejection fraction was in the range of 35% to 40%.   Doppler parameters are consistent with abnormal left ventricular   relaxation (grade 1 diastolic dysfunction). Indeterminate filling   pressures. - Regional wall motion abnormality: Akinesis of the mid inferior   myocardium; severe hypokinesis of the basal inferolateral   myocardium. Diffuse hypokinesis also noted. - Aortic valve: Mildly to moderately calcified annulus. Mildly   thickened, mildly calcified leaflets. Sclerosis without stenosis. - Mitral valve: Calcified annulus. Mildly thickened leaflets .   There was mild regurgitation. - Left atrium: The atrium was mildly to moderately dilated.  Antimicrobials:  Zosyn 10/10  Vancomycin 10/10  Rocephin 10/9>> 06/22/16    Subjective: Patient denies chest pain, shortness of breath, or abdominal pain. He knows that  his blood sugar was low this morning, but denies feeling sweaty or shaky.  Objective: Vitals:   06/23/16 0200 06/23/16 0300 06/23/16 0400 06/23/16 0500  BP:   97/63   Pulse: 82 79 78 78  Resp: (!) 22 19 (!) 26 (!) 26  Temp:   98.9 F (37.2 C)   TempSrc:   Oral   SpO2: 97% 98% 97% 98%  Weight:    70.6 kg (155 lb 10.3 oz)  Height:        Intake/Output Summary (Last 24 hours) at 06/23/16 0840 Last data filed at 06/23/16 0500  Gross per 24 hour  Intake          1386.25 ml  Output             2000 ml  Net          -613.75 ml   Filed Weights   06/21/16 1649 06/22/16 0400 06/23/16 0500  Weight: 67.9 kg (149 lb 11.1 oz) 68.1 kg (150 lb 2.1 oz) 70.6 kg (155 lb 10.3 oz)    Examination:  General exam: Elderly alert 75 year old Caucasian man who appears calm and comfortable  Respiratory system: A few scattered crackles bilaterally Respiratory effort normal. Cardiovascular system: S1 & S2 heard, RRR. No JVD, murmurs, rubs, gallops or clicks. No pedal edema. Gastrointestinal system: Abdomen is nondistended, soft and nontender. No organomegaly or masses felt. Normal bowel sounds heard. Central nervous system: Alert and oriented 3-to himself, "[place]" and president. No focal neurological deficits. Extremities: Symmetric 5 x 5 power. Skin: No rashes, lesions or ulcers Psychiatry: Insight appears appropriate. Mood & affect appropriate.  Data Reviewed: I have personally reviewed following labs and imaging studies  CBC:  Recent Labs Lab 06/21/16 1224 06/22/16 0150 06/23/16 0158  WBC 14.1* 15.8* 9.8  NEUTROABS 11.0*  --   --   HGB 14.0 13.0 10.5*  HCT 39.5 36.4* 29.9*  MCV 92.1 90.8 92.0  PLT 194 207 158   Basic Metabolic Panel:  Recent Labs Lab 06/21/16 1218 06/21/16 1235 06/21/16 1953 06/22/16 0150 06/23/16 0158  NA 131*  --  136 135 135  K 4.4  --  3.2* 3.3* 3.4*  CL 96*  --  104 107 108  CO2 22  --  22 22 23   GLUCOSE 584*  --  146* 103* 166*  BUN 33*   --  27* 27* 25*  CREATININE 1.88*  --  1.36* 1.33* 1.43*  CALCIUM 9.2  --  8.6* 8.7* 7.6*  MG  --  1.1*  --   --   --    GFR: Estimated Creatinine Clearance: 43.8 mL/min (by C-G formula based on SCr of 1.43 mg/dL (H)). Liver Function Tests:  Recent Labs Lab 06/21/16 1218 06/22/16 1400 06/23/16 0158  AST 28 38 28  ALT 18 20 16*  ALKPHOS 79 66 48  BILITOT 1.7* 1.0 0.7  PROT 6.9 6.9 5.1*  ALBUMIN 3.9 3.6 2.6*   No results for input(s): LIPASE, AMYLASE in the last 168 hours. No results for input(s): AMMONIA in the last 168 hours. Coagulation Profile:  Recent Labs Lab 06/22/16 1628  INR 0.95   Cardiac Enzymes:  Recent Labs Lab 06/22/16 0150 06/22/16 0805 06/22/16 1628 06/22/16 1935 06/23/16 0158  TROPONINI 0.24* 0.23* 0.16* 0.14* 0.13*   BNP (last 3 results) No results for input(s): PROBNP in the last 8760 hours. HbA1C:  Recent Labs  06/22/16 0150  HGBA1C 8.2*   CBG:  Recent Labs Lab 06/22/16 1759 06/22/16 1947 06/22/16 2350 06/23/16 0349 06/23/16 0733  GLUCAP 106* 107* 215* 120* 69   Lipid Profile: No results for input(s): CHOL, HDL, LDLCALC, TRIG, CHOLHDL, LDLDIRECT in the last 72 hours. Thyroid Function Tests: No results for input(s): TSH, T4TOTAL, FREET4, T3FREE, THYROIDAB in the last 72 hours. Anemia Panel: No results for input(s): VITAMINB12, FOLATE, FERRITIN, TIBC, IRON, RETICCTPCT in the last 72 hours. Sepsis Labs:  Recent Labs Lab 06/22/16 1628 06/22/16 1935  PROCALCITON 53.03  --   LATICACIDVEN 2.8* 1.9    Recent Results (from the past 240 hour(s))  MRSA PCR Screening     Status: Abnormal   Collection Time: 06/21/16  4:35 PM  Result Value Ref Range Status   MRSA by PCR POSITIVE (A) NEGATIVE Final    Comment:        The GeneXpert MRSA Assay (FDA approved for NASAL specimens only), is one component of a comprehensive MRSA colonization surveillance program. It is not intended to diagnose MRSA infection nor to guide or monitor  treatment for MRSA infections. RESULT CALLED TO, READ BACK BY AND VERIFIED WITH: HEARN,J ON 06/21/16 AT 2300 BY LOY,C   Culture, blood (x 2)     Status: None (Preliminary result)   Collection Time: 06/22/16  4:28 PM  Result Value Ref Range Status   Specimen Description BLOOD LEFT ANTECUBITAL  Final   Special Requests BOTTLES DRAWN AEROBIC AND ANAEROBIC 10CC EACH  Final   Culture PENDING  Incomplete   Report Status PENDING  Incomplete  Culture, blood (x 2)     Status: None (Preliminary result)   Collection Time: 06/22/16  4:32 PM  Result Value Ref Range Status   Specimen Description BLOOD LEFT HAND  Final   Special Requests BOTTLES DRAWN AEROBIC AND ANAEROBIC [facility] EACH  Final   Culture PENDING  Incomplete   Report Status PENDING  Incomplete         Radiology Studies: Dg Chest 2 View  Result Date: 06/22/2016 CLINICAL DATA:  Leukocytosis.  Diabetes. EXAM: CHEST  2 VIEW COMPARISON:  06/21/2016. FINDINGS: Cardiomegaly. Chronic lung markings but no consolidation. Loop recorder. Thoracic atherosclerosis. No effusion or pneumothorax. Osteopenia. Improved aeration from priors. IMPRESSION: No active infiltrates or failure. Electronically Signed   By: Elsie Stain M.D.   On: 06/22/2016 14:33   Dg Chest 2 View  Result Date: 06/21/2016 CLINICAL DATA:  Altered mental status.  The patient fell today. EXAM: CHEST  2 VIEW COMPARISON:  04/09/2016 FINDINGS: The heart size and mediastinal contours are within normal limits. Chronic accentuation of the interstitial markings. No acute infiltrates or effusions. No acute bone abnormality. IMPRESSION: No acute abnormality.  Chronic interstitial disease. Electronically Signed   By: Francene Boyers M.D.   On: 06/21/2016 13:01   Ct Head Wo Contrast  Result Date: 06/21/2016 CLINICAL DATA:  Altered mental status. Fall. Posterior head injury. EXAM: CT HEAD WITHOUT CONTRAST TECHNIQUE: Contiguous axial images were obtained from the base of the skull through the  vertex without intravenous contrast. COMPARISON:  01/13/2016 head CT. FINDINGS: Brain: No evidence of parenchymal hemorrhage or extra-axial fluid collection. No mass lesion, mass effect, or midline shift. No CT evidence of acute infarction. Stable encephalomalacia in the left frontal and left occipital lobes. Intracranial atherosclerosis. Nonspecific mild to moderate subcortical and periventricular white matter hypodensity, most in keeping with chronic small vessel ischemic change. Underlying generalized cerebral volume loss. Cerebral ventricle sizes are stable and concordant with the degree of cerebral volume loss. Vascular: No hyperdense vessel or unexpected calcification. Skull: No evidence of calvarial fracture. Sinuses/Orbits: The visualized paranasal sinuses are essentially clear. Stable chronic atrophy and calcification of the left globe. Other:  The mastoid air cells are unopacified. IMPRESSION: 1. No evidence of acute intracranial abnormality. No evidence of calvarial fracture. 2. Stable left frontal and left occipital lobe encephalomalacia. 3. Mild-to-moderate chronic small vessel ischemia. Electronically Signed   By: Delbert Phenix M.D.   On: 06/21/2016 13:08   US Carotid Bilateral  Result Date: 06/22/2016 CLINICAL DATA:  Mental status changes, hyperlipidemia, diabetes, coronary artery disease and history of stroke. History of prior left carotid on 06/17/2015. EXAM: BILATERAL CAROTID DUPLEX ULTRASOUND TECHNIQUE: Wallace Cullens scale imaging, color Doppler and duplex ultrasound were performed of bilateral carotid and vertebral arteries in the neck. COMPARISON:  01/15/2016 FINDINGS: Criteria: Quantification of carotid stenosis is based on velocity parameters that correlate the residual internal carotid diameter with NASCET-based stenosis levels, using the diameter of the distal internal carotid lumen as the denominator for stenosis measurement. The following velocity measurements were obtained: RIGHT ICA:  176/28  cm/sec CCA:  84/13 cm/sec SYSTOLIC ICA/CCA RATIO:  2.6 DIASTOLIC ICA/CCA RATIO:  2.7 ECA:  130 seconds cm/sec LEFT ICA:  63/15 cm/sec CCA:  94/14 cm/sec SYSTOLIC ICA/CCA RATIO:  0.8 DIASTOLIC ICA/CCA RATIO:  0.9 ECA:  82 cm/sec RIGHT CAROTID ARTERY: Moderate amount of partially calcified plaque again noted at the level of the carotid bulb and proximal ICA. Based on velocities today, estimated right ICA stenosis is 50- 69%. RIGHT VERTEBRAL ARTERY: Antegrade flow with normal waveform and velocity. LEFT CAROTID ARTERY: No significant left-sided plaque identified after prior endarterectomy. No evidence of  carotid stenosis on the left. LEFT VERTEBRAL ARTERY: Antegrade flow with normal waveform and velocity. IMPRESSION: Moderate partially calcified plaque at the right carotid bifurcation again noted with estimated 50- 69% right ICA stenosis. No evidence of left carotid stenosis following prior endarterectomy. Electronically Signed   By: Irish Lack M.D.   On: 06/22/2016 15:43        Scheduled Meds: . aspirin  81 mg Oral Daily  . atorvastatin  80 mg Oral q1800  . carvedilol  3.125 mg Oral BID WC  . Chlorhexidine Gluconate Cloth  6 each Topical Q0600  . citalopram  20 mg Oral Daily  . clopidogrel  75 mg Oral Daily  . enoxaparin (LOVENOX) injection  40 mg Subcutaneous Q24H  . feeding supplement (ENSURE ENLIVE)  237 mL Oral BID BM  . gabapentin  300 mg Oral TID  . Influenza vac split quadrivalent PF  0.5 mL Intramuscular Tomorrow-1000  . insulin aspart  0-15 Units Subcutaneous Q4H  . insulin detemir  20 Units Subcutaneous QHS  . mupirocin ointment  1 application Nasal BID  . pantoprazole  40 mg Oral Daily  . piperacillin-tazobactam (ZOSYN)  IV  3.375 g Intravenous Q8H  . pneumococcal 23 valent vaccine  0.5 mL Intramuscular Tomorrow-1000  . sodium chloride flush  3 mL Intravenous Q12H  . tamsulosin  0.4 mg Oral Daily  . vancomycin  500 mg Intravenous Q12H   Continuous Infusions: . sodium  chloride 75 mL/hr at 06/22/16 1815     LOS: 0 days    Time spent: 30 minutes    Elliot Cousin, MD [facility] Pager [phone]  If 7PM-7AM, please contact night-coverage [email or web address] Password [email or web address] 06/23/2016, 8:40 AM

## 2016-06-24 ENCOUNTER — Encounter (HOSPITAL_COMMUNITY): Payer: Self-pay | Admitting: Internal Medicine

## 2016-06-24 DIAGNOSIS — J849 Interstitial pulmonary disease, unspecified: Secondary | ICD-10-CM

## 2016-06-24 DIAGNOSIS — G9340 Encephalopathy, unspecified: Secondary | ICD-10-CM

## 2016-06-24 DIAGNOSIS — E876 Hypokalemia: Secondary | ICD-10-CM

## 2016-06-24 HISTORY — DX: Interstitial pulmonary disease, unspecified: J84.9

## 2016-06-24 LAB — GLUCOSE, CAPILLARY
GLUCOSE-CAPILLARY: 214 mg/dL — AB (ref 65–99)
GLUCOSE-CAPILLARY: 198 mg/dL — AB (ref 65–99)
GLUCOSE-CAPILLARY: 260 mg/dL — AB (ref 65–99)
GLUCOSE-CAPILLARY: 252 mg/dL — AB (ref 65–99)
GLUCOSE-CAPILLARY: 286 mg/dL — AB (ref 65–99)

## 2016-06-24 LAB — BASIC METABOLIC PANEL
Anion gap: 7 (ref 5–15)
BUN: 13 mg/dL (ref 6–20)
CALCIUM: 8.3 mg/dL — AB (ref 8.9–10.3)
CO2: 25 mmol/L (ref 22–32)
CREATININE: 1.17 mg/dL (ref 0.61–1.24)
Chloride: 103 mmol/L (ref 101–111)
GFR calc non Af Amer: 60 mL/min — ABNORMAL LOW (ref >60)
Glucose, Bld: 223 mg/dL — ABNORMAL HIGH (ref 65–99)
Potassium: 3.8 mmol/L (ref 3.5–5.1)
SODIUM: 135 mmol/L (ref 135–145)

## 2016-06-24 LAB — CBC
HCT: 36 % — ABNORMAL LOW (ref 39.0–52.0)
Hemoglobin: 12.5 g/dL — ABNORMAL LOW (ref 13.0–17.0)
MCH: 32.4 pg (ref 26.0–34.0)
MCHC: 34.7 g/dL (ref 30.0–36.0)
MCV: 93.3 fL (ref 78.0–100.0)
PLATELETS: 183 10*3/uL (ref 150–400)
RBC: 3.86 MIL/uL — AB (ref 4.22–5.81)
RDW: 12.3 % (ref 11.5–15.5)
WBC: 8.6 10*3/uL (ref 4.0–10.5)

## 2016-06-24 LAB — TSH: TSH: 0.264 u[IU]/mL — ABNORMAL LOW (ref 0.350–4.500)

## 2016-06-24 LAB — VITAMIN B12: Vitamin B-12: 283 pg/mL (ref 180–914)

## 2016-06-24 LAB — MAGNESIUM: MAGNESIUM: 1.3 mg/dL — AB (ref 1.7–2.4)

## 2016-06-24 MED ORDER — DEXTROSE 5 % IV SOLN
1.0000 g | INTRAVENOUS | Status: DC
  Administered 2016-06-25: 1 g via INTRAVENOUS
  Filled 2016-06-24 (×4): qty 10

## 2016-06-24 MED ORDER — INSULIN GLARGINE 100 UNIT/ML ~~LOC~~ SOLN
20.0000 [IU] | Freq: Every day | SUBCUTANEOUS | Status: DC
  Administered 2016-06-24: 20 [IU] via SUBCUTANEOUS
  Filled 2016-06-24: qty 0.2

## 2016-06-24 MED ORDER — MAGNESIUM SULFATE 50 % IJ SOLN
1.0000 g | Freq: Once | INTRAMUSCULAR | Status: AC
  Administered 2016-06-24: 1 g via INTRAVENOUS
  Filled 2016-06-24: qty 2

## 2016-06-24 NOTE — Progress Notes (Signed)
Physical Therapy Treatment Patient Details Name: Lucas Morgan MRN: [id number] DOB: Aug 15, 1941 Today's Date: 06/24/2016    History of Present Illness Patient is a 75 year old man with a history of previous left brain CVA, dementia, CKD, chronic pain-on chronic opiates, combined CHF, CAD, carotid artery disease, and diabetes mellitus, who presented on 06/21/16 with altered mental status and visual hallucinations. Pt also reported that he had a fall.  In the ED, he was tachycardic, his venous glucose was 583. His urine drug screen was positive for opiates. His UA revealed many bacteria. Troponin I was slightly elevated at 0.06 and elevated up to 0.24, but now trending back down at 0.13 - this is likely from infection and acute renal failure. Head CT revealed no acute intracranial abnormality, but with stable left frontal and left occipital lobe encephalomalacia and chronic small vessel disease. Chest x-ray (-) for acute process. He was admitted for further evaluation and management.  Dx: AMS/acute encephalopathy, DM2 with hyperosmolar hyperglycemia, UTI.    PT Comments    Pt received in bed, and was agreeable to PT tx.  Pt was able to improve gait distance tremendously today, however he continues to require Min A due to unsteadiness with even slight perturbation.  Pt was also able to complete exercises, but requires cues for technique.  Pt would still benefit from SNF at d/c.  If he goes home, he is recommended for HHPT, and 24/7 supervision/assistance.    Follow Up Recommendations  SNF;Home health PT;Supervision/Assistance - 24 hour     Equipment Recommendations  None recommended by PT    Recommendations for Other Services       Precautions / Restrictions Precautions Precautions: Fall Precaution Comments: Pt reports falling just prior to admission.  Pt also has a history of falling.   Restrictions Weight Bearing Restrictions: No    Mobility  Bed Mobility Overal bed mobility: Needs  Assistance Bed Mobility: Supine to Sit     Supine to sit: Min assist (increased time)        Transfers Overall transfer level: Needs assistance Equipment used: Rolling walker (2 wheeled) Transfers: Sit to/from Stand (pt attempted multiple times to stand via pulling on RW.  Pt then given tc's to push from the bed.  ) Sit to Stand: Min assist            Ambulation/Gait   Ambulation Distance (Feet): 200 Feet Assistive device: Rolling walker (2 wheeled) Gait Pattern/deviations: Step-through pattern;Trunk flexed     General Gait Details: Pt continues to demonstrate unsteadiness during gait, more so when turning the corner.   Stairs            Wheelchair Mobility    Modified Rankin (Stroke Patients Only)       Balance   Sitting-balance support: No upper extremity supported Sitting balance-Leahy Scale: Fair     Standing balance support: Bilateral upper extremity supported Standing balance-Leahy Scale: Fair                      Cognition Arousal/Alertness: Awake/alert Behavior During Therapy: Impulsive Overall Cognitive Status: Within Functional Limits for tasks assessed                      Exercises General Exercises - Upper Extremity Chair Push Up: Strengthening;10 reps;Seated General Exercises - Lower Extremity Long Arc Quad: Strengthening;20 reps;Seated Hip Flexion/Marching: Strengthening;20 reps;Seated (Pt required visual cues and verbal cues to slow down and complete full range)  General Comments        Pertinent Vitals/Pain Pain Assessment: No/denies pain    Home Living                      Prior Function            PT Goals (current goals can now be found in the care plan section) Acute Rehab PT Goals Patient Stated Goal: None stated Time For Goal Achievement: 06/30/16 Potential to Achieve Goals: Fair Progress towards PT goals: Progressing toward goals    Frequency    Min 3X/week      PT Plan  Current plan remains appropriate    Co-evaluation             End of Session Equipment Utilized During Treatment: Gait belt Activity Tolerance: Patient limited by fatigue Patient left: in chair;with call bell/phone within reach     Time: [id number] PT Time Calculation (min) (ACUTE ONLY): 27 min  Charges:  $Gait Training: 8-22 mins $Therapeutic Exercise: 8-22 mins                    G Codes:      Beth Yesly Gerety, PT, DPT X: [phone]

## 2016-06-24 NOTE — Evaluation (Signed)
Occupational Therapy Evaluation Patient Details Name: Lucas EatonJohn C Behanna MRN: [id number] DOB: 09/23/1940 Today's Date: 06/24/2016    History of Present Illness Patient is a 75 year old man with a history of previous left brain CVA, dementia, CKD, chronic pain-on chronic opiates, combined CHF, CAD, carotid artery disease, and diabetes mellitus, who presented on 06/21/16 with altered mental status and visual hallucinations. Pt also reported that he had a fall.  In the ED, he was tachycardic, his venous glucose was 583. His urine drug screen was positive for opiates. His UA revealed many bacteria. Troponin I was slightly elevated at 0.06 and elevated up to 0.24, but now trending back down at 0.13 - this is likely from infection and acute renal failure. Head CT revealed no acute intracranial abnormality, but with stable left frontal and left occipital lobe encephalomalacia and chronic small vessel disease. Chest x-ray (-) for acute process. He was admitted for further evaluation and management.  Dx: AMS/acute encephalopathy, DM2 with hyperosmolar hyperglycemia, UTI.   Clinical Impression   Pt awake, alert, oriented x3 this am, agreeable to OT evaluation. Pt requires verbal cuing for sequencing with ADL and mobility tasks during evaluation. Pt demonstrates poor balance, sitting and standing, requiring assistance at all times for ADL completion and mobility purposes. Pt reports daughter has been assisting with dressing and bathing tasks at home. Grandson stays with pt while daughter works (no family present to confirm pt statements). Recommend SNF on discharge to improve safety and independence in ADL and mobility tasks. No further acute care OT needs at this time, will defer further treatment to SNF post discharge.     Follow Up Recommendations  SNF    Equipment Recommendations  None recommended by OT       Precautions / Restrictions Precautions Precautions: Fall Precaution Comments: Pt reports  falling just prior to admission.  Pt also has a history of falling.   Restrictions Weight Bearing Restrictions: No      Mobility Bed Mobility Overal bed mobility: Needs Assistance Bed Mobility: Supine to Sit     Supine to sit: Supervision;HOB elevated        Transfers Overall transfer level: Needs assistance Equipment used: Rolling walker (2 wheeled) Transfers: Sit to/from Stand Sit to Stand: Min guard (pt requires verbal cuing for hand placement to push from bed)                   ADL Overall ADL's : Needs assistance/impaired Eating/Feeding: Set up;Bed level                   Lower Body Dressing: Maximal assistance;Sitting/lateral leans                 General ADL Comments: Pt requiring assistance with ADL tasks due poor balance and instability in sitting and standing               Pertinent Vitals/Pain Pain Assessment: No/denies pain     Hand Dominance Right   Extremity/Trunk Assessment Upper Extremity Assessment Upper Extremity Assessment: Generalized weakness (RUE slightly weaker than LUE)   Lower Extremity Assessment Lower Extremity Assessment: Defer to PT evaluation       Communication Communication Communication: Expressive difficulties   Cognition Arousal/Alertness: Awake/alert Behavior During Therapy: WFL for tasks assessed/performed Overall Cognitive Status: Impaired/Different from baseline  Home Living   Living Arrangements: Children (lives with dtr, grandson there when dtr is away)   Type of Home: House Home Access: Ramped entrance     Home Layout: One level     Bathroom Shower/Tub: Chief Strategy Officer: Handicapped height     Home Equipment: Environmental consultant - 2 wheels;Bedside commode;Shower seat;Wheelchair - manual;Electric scooter;Grab bars - tub/shower          Prior Functioning/Environment Level of Independence: Needs assistance  Gait / Transfers  Assistance Needed: Pt reports using a RW for ambulation PTA.   ADL's / Homemaking Assistance Needed: Per pt report: dtr assisting with dressing and bathing, meal preparation, and household tasks Communication / Swallowing Assistance Needed: aphasia Comments: Per pt report, hasn't driven since approximately May 2017        OT Problem List: Decreased strength;Decreased activity tolerance;Impaired balance (sitting and/or standing);Decreased cognition;Decreased safety awareness    End of Session Equipment Utilized During Treatment: Gait belt;Rolling walker  Activity Tolerance: Patient tolerated treatment well Patient left: in bed;with call bell/phone within reach   Time: 0831-0855 OT Time Calculation (min): 24 min Charges:  OT General Charges $OT Visit: 1 Procedure OT Evaluation $OT Eval Low Complexity: 1 Procedure Ezra Sites, OTR/L  [phone] 06/24/2016, 9:01 AM

## 2016-06-24 NOTE — Progress Notes (Addendum)
PROGRESS NOTE    Lucas Morgan  ZOX:[id number] DOB: 06-20-1941 DOA: 06/21/2016 PCP: [facility]    Brief Narrative:  Patient is a 75 year old man with a history of previous left brain CVA, dementia, CKD, chronic pain-on chronic opiates, combined CHF, CAD, carotid artery disease, and diabetes mellitus, who presented on 06/21/16 with altered mental status and visual hallucinations. In the ED, he was tachycardic, but with a normal blood pressure. He was afebrile. His venous glucose was 583. His anion gap was normal. His urine drug screen was positive for opiates. His UA revealed many bacteria. His white blood cell count was 14.1. Troponin I was slightly elevated at 0.06. Sodium was 131. Head CT revealed no acute intracranial abnormality, but with stable left frontal and left occipital lobe encephalomalacia and chronic small vessel disease. Chest x-ray revealed no acute abnormality, but with chronic interstitial disease. He was admitted for further evaluation and management.   Assessment & Plan:   Principal Problem:   Acute encephalopathy Active Problems:   Acute on chronic renal failure (HCC)   Chronic pain syndrome   Type 2 diabetes mellitus with stage 2 chronic kidney disease, with long-term current use of insulin (HCC)   HLD (hyperlipidemia)   Altered mental status   Hypomagnesemia   Elevated troponin   AKI (acute kidney injury) (HCC)   Hypokalemia   Chronic interstitial lung disease (HCC)   1. Altered mental status/acute encephalopathy with reported hallucinations. The patient was started on supportive treatment and gentle IV fluids and treatment for accelerated hyperglycemia. Rocephin was started empirically for what was thought to be a UTI. Diagnostic studies revealed an echo with an EF of 35-40% which is virtually stable compared to TEE 2016. Carotid ultrasound reveals stable 50-69% right ICA stenosis/no left carotid artery stenosis following prior  endarterectomy. -TSH was ordered and was modestly low at 0.26. Will order free T4, but this was not likely the etiology of his confusion. Vitamin B12 level is pending.  Neurology was consulted and believed that the patient's encephalopathy was likely from severe hyperglycemia and volume depletion. -With treatment of his hyperglycemia, the patient's encephalopathy has resolved. This was the likely etiology of his acute encephalopathy.  History of multiple cortical infarcts. Patient had a recent loop recorder which failed to reveal atrial fibrillation. He was continued on Plavix and aspirin.  Type 2 diabetes with hyperosmolar hyperglycemia. The patient is treated with metformin and Lantus chronically. His venous glucose was greater than 580 on admission. He was started on an insulin drip. His blood glucose improved quickly. His CBGs started running on the low side of normal, so Lantus and sliding scale NovoLog were decreased. -His A1c was found to be 8.2. -We'll continue to monitor for continued adjustments to try to avoid wide swings in his CBGs.  UTI. The patient's urinalysis revealed many bacteria. His white blood cell count was 15.8 on admission.  He was started on Rocephin which was eventually broadened to Zosyn and vancomycin empirically. -Blood cultures are negative to date. Urine culture revealed no growth. MRSA screen was positive.  -His white blood cell count has improved and he remains afebrile. -I do not believe the patient had sepsis on admission or now. --We'll narrow antibiotic back to Rocephin. We'll continue vancomycin due to positive MRSA screen and will likely discontinue it tomorrow.  Elevated troponin I in the setting of CAD and chronic combined CHF.  Patient's troponin I was 0.10 on admission and reached a high of 0.23.  It has since trended down. Patient had no endorsement of chest pain. Echo noted for known reduced EF. EKG revealed stable right bundle branch block but with  a few PVCs. -Patient was continued on Plavix, aspirin, and carvedilol. Lisinopril was held due to AKI. -The etiology of the elevation was likely from infection and acute on chronic renal failure.  Acute kidney injury superimposed on stage III chronic kidney disease. Per chart review, the patient's baseline creatinine appears to range from 1.3-1.4. It was 1.88 on admission. -Lisinopril was withheld. He was started on gentle IV fluids. His creatinine has improved to baseline.  Hypokalemia and hypomagnesemia. Patient's serum potassium was 3.2 on admission. He was given potassium chloride. His serum potassium has improved. Magnesium level was assessed and was slow at 1.3. -We'll give 1 g of magnesium sulfate.  Chronic pain syndrome. Percocet every 6 hours as needed was restarted. Currently stable.  Pulmonary crackles, secondary to chronic interstitial lung disease. Due to of the pulmonary crackles auscultated on exam, a chest x-ray was ordered on 06/23/16. It revealed stable chronic mild interstitial prominence, but no infiltrate or edema.  Deconditioning. PT evaluated the patient and recommended SNF placement.    DVT prophylaxis: Lovenox Code Status: Full code Family Communication: Family not available Disposition Plan: Discharge when clinically appropriate to SNF, likely tomorrow.   Consultants:   Neurology  Procedures:   -2-D echo 06/22/16:Study Conclusions - Left ventricle: The cavity size was normal. There was mild   concentric hypertrophy. Systolic function was moderately reduced.   The estimated ejection fraction was in the range of 35% to 40%.   Doppler parameters are consistent with abnormal left ventricular   relaxation (grade 1 diastolic dysfunction). Indeterminate filling   pressures. - Regional wall motion abnormality: Akinesis of the mid inferior   myocardium; severe hypokinesis of the basal inferolateral   myocardium. Diffuse hypokinesis also noted. - Aortic  valve: Mildly to moderately calcified annulus. Mildly   thickened, mildly calcified leaflets. Sclerosis without stenosis. - Mitral valve: Calcified annulus. Mildly thickened leaflets .   There was mild regurgitation. - Left atrium: The atrium was mildly to moderately dilated.  Antimicrobials:  Zosyn 10/10>> 06/24/16  Vancomycin 10/10  Rocephin 10/9>> 06/22/16>> restart 06/24/16    Subjective: Patient denies chest pain, shortness of breath, or abdominal pain. He knows that his blood sugar was low this morning, but denies feeling sweaty or shaky.  Objective: Vitals:   06/24/16 0300 06/24/16 0400 06/24/16 0500 06/24/16 0754  BP: (!) 127/100 (!) 127/97    Pulse: 82 86    Resp: (!) 24 18    Temp:  99.2 F (37.3 C)  97.7 F (36.5 C)  TempSrc:  Oral  Oral  SpO2: 100% 100%    Weight:   70.6 kg (155 lb 10.3 oz)   Height:        Intake/Output Summary (Last 24 hours) at 06/24/16 0828 Last data filed at 06/24/16 0500  Gross per 24 hour  Intake             1436 ml  Output             3751 ml  Net            -2315 ml   Filed Weights   06/22/16 0400 06/23/16 0500 06/24/16 0500  Weight: 68.1 kg (150 lb 2.1 oz) 70.6 kg (155 lb 10.3 oz) 70.6 kg (155 lb 10.3 oz)    Examination:  General exam: Elderly alert 75 year old Caucasian man  who appears calm and comfortable  Respiratory system: A few scattered crackles bilaterally Respiratory effort normal. Cardiovascular system: S1 & S2 heard, RRR. No JVD, murmurs, rubs, gallops or clicks. No pedal edema. Gastrointestinal system: Abdomen is nondistended, soft and nontender. No organomegaly or masses felt. Normal bowel sounds heard. Central nervous system: Alert and oriented 3-to himself, "Carbon" and president. No focal neurological deficits. Extremities: Symmetric 5 x 5 power. Skin: No rashes, lesions or ulcers Psychiatry: Insight appears appropriate. Mood & affect appropriate.     Data Reviewed: I have personally reviewed  following labs and imaging studies  CBC:  Recent Labs Lab 06/21/16 1224 06/22/16 0150 06/23/16 0158 06/24/16 0449  WBC 14.1* 15.8* 9.8 8.6  NEUTROABS 11.0*  --   --   --   HGB 14.0 13.0 10.5* 12.5*  HCT 39.5 36.4* 29.9* 36.0*  MCV 92.1 90.8 92.0 93.3  PLT 194 207 158 183   Basic Metabolic Panel:  Recent Labs Lab 06/21/16 1218 06/21/16 1235 06/21/16 1953 06/22/16 0150 06/23/16 0158 06/24/16 0449  NA 131*  --  136 135 135 135  K 4.4  --  3.2* 3.3* 3.4* 3.8  CL 96*  --  104 107 108 103  CO2 22  --  22 22 23 25   GLUCOSE 584*  --  146* 103* 166* 223*  BUN 33*  --  27* 27* 25* 13  CREATININE 1.88*  --  1.36* 1.33* 1.43* 1.17  CALCIUM 9.2  --  8.6* 8.7* 7.6* 8.3*  MG  --  1.1*  --   --   --  1.3*   GFR: Estimated Creatinine Clearance: 53.6 mL/min (by C-G formula based on SCr of 1.17 mg/dL). Liver Function Tests:  Recent Labs Lab 06/21/16 1218 06/22/16 1400 06/23/16 0158  AST 28 38 28  ALT 18 20 16*  ALKPHOS 79 66 48  BILITOT 1.7* 1.0 0.7  PROT 6.9 6.9 5.1*  ALBUMIN 3.9 3.6 2.6*   No results for input(s): LIPASE, AMYLASE in the last 168 hours. No results for input(s): AMMONIA in the last 168 hours. Coagulation Profile:  Recent Labs Lab 06/22/16 1628  INR 0.95   Cardiac Enzymes:  Recent Labs Lab 06/22/16 0150 06/22/16 0805 06/22/16 1628 06/22/16 1935 06/23/16 0158  TROPONINI 0.24* 0.23* 0.16* 0.14* 0.13*   BNP (last 3 results) No results for input(s): PROBNP in the last 8760 hours. HbA1C:  Recent Labs  06/22/16 0150  HGBA1C 8.2*   CBG:  Recent Labs Lab 06/23/16 0840 06/23/16 1156 06/23/16 1636 06/23/16 2146 06/24/16 0730  GLUCAP 154* 214* 216* 229* 198*   Lipid Profile: No results for input(s): CHOL, HDL, LDLCALC, TRIG, CHOLHDL, LDLDIRECT in the last 72 hours. Thyroid Function Tests:  Recent Labs  06/24/16 0449  TSH 0.264*   Anemia Panel: No results for input(s): VITAMINB12, FOLATE, FERRITIN, TIBC, IRON, RETICCTPCT in the  last 72 hours. Sepsis Labs:  Recent Labs Lab 06/22/16 1628 06/22/16 1935  PROCALCITON 53.03  --   LATICACIDVEN 2.8* 1.9    Recent Results (from the past 240 hour(s))  Urine culture     Status: None   Collection Time: 06/21/16 12:25 PM  Result Value Ref Range Status   Specimen Description URINE, RANDOM  Final   Special Requests NONE  Final   Culture NO GROWTH Performed at [facility]   Final   Report Status 06/23/2016 FINAL  Final  MRSA PCR Screening     Status: Abnormal   Collection Time: 06/21/16  4:35  PM  Result Value Ref Range Status   MRSA by PCR POSITIVE (A) NEGATIVE Final    Comment:        The GeneXpert MRSA Assay (FDA approved for NASAL specimens only), is one component of a comprehensive MRSA colonization surveillance program. It is not intended to diagnose MRSA infection nor to guide or monitor treatment for MRSA infections. RESULT CALLED TO, READ BACK BY AND VERIFIED WITH: HEARN,J ON 06/21/16 AT 2300 BY LOY,C   Culture, blood (x 2)     Status: None (Preliminary result)   Collection Time: 06/22/16  4:28 PM  Result Value Ref Range Status   Specimen Description BLOOD LEFT ANTECUBITAL  Final   Special Requests BOTTLES DRAWN AEROBIC AND ANAEROBIC 10CC EACH  Final   Culture NO GROWTH < 24 HOURS  Final   Report Status PENDING  Incomplete  Culture, blood (x 2)     Status: None (Preliminary result)   Collection Time: 06/22/16  4:32 PM  Result Value Ref Range Status   Specimen Description BLOOD LEFT HAND  Final   Special Requests BOTTLES DRAWN AEROBIC AND ANAEROBIC 5CC EACH  Final   Culture NO GROWTH < 24 HOURS  Final   Report Status PENDING  Incomplete         Radiology Studies: Dg Chest 1 View  Result Date: 06/23/2016 CLINICAL DATA:  Chest congestion EXAM: CHEST 1 VIEW COMPARISON:  06/22/2016 and 04/09/2016 FINDINGS: Cardiomediastinal silhouette is stable. Chronic mild interstitial prominence again noted. No infiltrate or pleural effusion. No  pulmonary edema. Degenerative changes thoracic spine again noted. There is mild progression of T11 vertebral body compression fracture. Clinical correlation is necessary. IMPRESSION: Chronic mild interstitial prominence again noted. No infiltrate or pleural effusion. No pulmonary edema. Degenerative changes thoracic spine again noted. There is mild progression of T11 vertebral body compression fracture. Clinical correlation is necessary. Electronically Signed   By: Natasha Mead M.D.   On: 06/23/2016 16:33   Dg Chest 2 View  Result Date: 06/22/2016 CLINICAL DATA:  Leukocytosis.  Diabetes. EXAM: CHEST  2 VIEW COMPARISON:  06/21/2016. FINDINGS: Cardiomegaly. Chronic lung markings but no consolidation. Loop recorder. Thoracic atherosclerosis. No effusion or pneumothorax. Osteopenia. Improved aeration from priors. IMPRESSION: No active infiltrates or failure. Electronically Signed   By: Elsie Stain M.D.   On: 06/22/2016 14:33   US Carotid Bilateral  Result Date: 06/22/2016 CLINICAL DATA:  Mental status changes, hyperlipidemia, diabetes, coronary artery disease and history of stroke. History of prior left carotid on 06/17/2015. EXAM: BILATERAL CAROTID DUPLEX ULTRASOUND TECHNIQUE: Wallace Cullens scale imaging, color Doppler and duplex ultrasound were performed of bilateral carotid and vertebral arteries in the neck. COMPARISON:  01/15/2016 FINDINGS: Criteria: Quantification of carotid stenosis is based on velocity parameters that correlate the residual internal carotid diameter with NASCET-based stenosis levels, using the diameter of the distal internal carotid lumen as the denominator for stenosis measurement. The following velocity measurements were obtained: RIGHT ICA:  176/28 cm/sec CCA:  84/13 cm/sec SYSTOLIC ICA/CCA RATIO:  2.6 DIASTOLIC ICA/CCA RATIO:  2.7 ECA:  130 seconds cm/sec LEFT ICA:  63/15 cm/sec CCA:  94/14 cm/sec SYSTOLIC ICA/CCA RATIO:  0.8 DIASTOLIC ICA/CCA RATIO:  0.9 ECA:  82 cm/sec RIGHT CAROTID  ARTERY: Moderate amount of partially calcified plaque again noted at the level of the carotid bulb and proximal ICA. Based on velocities today, estimated right ICA stenosis is 50- 69%. RIGHT VERTEBRAL ARTERY: Antegrade flow with normal waveform and velocity. LEFT CAROTID ARTERY: No significant left-sided plaque identified after  prior endarterectomy. No evidence of carotid stenosis on the left. LEFT VERTEBRAL ARTERY: Antegrade flow with normal waveform and velocity. IMPRESSION: Moderate partially calcified plaque at the right carotid bifurcation again noted with estimated 50- 69% right ICA stenosis. No evidence of left carotid stenosis following prior endarterectomy. Electronically Signed   By: Irish LackGlenn  Yamagata M.D.   On: 06/22/2016 15:43        Scheduled Meds: . aspirin  81 mg Oral Daily  . atorvastatin  80 mg Oral q1800  . carvedilol  3.125 mg Oral BID WC  . Chlorhexidine Gluconate Cloth  6 each Topical Q0600  . citalopram  20 mg Oral Daily  . clopidogrel  75 mg Oral Daily  . enoxaparin (LOVENOX) injection  40 mg Subcutaneous Q24H  . feeding supplement (ENSURE ENLIVE)  237 mL Oral BID BM  . gabapentin  300 mg Oral TID  . insulin aspart  0-15 Units Subcutaneous TID WC  . insulin aspart  0-5 Units Subcutaneous QHS  . insulin glargine  10 Units Subcutaneous QHS  . mupirocin ointment  1 application Nasal BID  . pantoprazole  40 mg Oral Daily  . piperacillin-tazobactam (ZOSYN)  IV  3.375 g Intravenous Q8H  . potassium chloride  20 mEq Oral BID  . sodium chloride flush  3 mL Intravenous Q12H  . tamsulosin  0.4 mg Oral Daily  . vancomycin  500 mg Intravenous Q12H   Continuous Infusions: . sodium chloride 10 mL (06/23/16 0924)     LOS: 1 day    Time spent: 30 minutes    Elliot CousinFISHER,Jaydah Stahle, MD [facility] Pager [phone]  If 7PM-7AM, please contact night-coverage [email or web address] Password [email or web address] 06/24/2016, 8:28 AM

## 2016-06-24 NOTE — Care Management Important Message (Signed)
Important Message  Patient Details  Name: Lucas EatonJohn C Morgan MRN: [id number] Date of Birth: 04/18/1941   Medicare Important Message Given:  Yes    Malcolm MetroChildress, Anila Bojarski Demske, RN 06/24/2016, 2:29 PM

## 2016-06-25 DIAGNOSIS — R339 Retention of urine, unspecified: Secondary | ICD-10-CM

## 2016-06-25 LAB — CBC
HCT: 36 % — ABNORMAL LOW (ref 39.0–52.0)
Hemoglobin: 12.6 g/dL — ABNORMAL LOW (ref 13.0–17.0)
MCH: 32.6 pg (ref 26.0–34.0)
MCHC: 35 g/dL (ref 30.0–36.0)
MCV: 93 fL (ref 78.0–100.0)
PLATELETS: 215 10*3/uL (ref 150–400)
RBC: 3.87 MIL/uL — AB (ref 4.22–5.81)
RDW: 12.2 % (ref 11.5–15.5)
WBC: 9.9 10*3/uL (ref 4.0–10.5)

## 2016-06-25 LAB — BASIC METABOLIC PANEL
Anion gap: 6 (ref 5–15)
BUN: 14 mg/dL (ref 6–20)
CHLORIDE: 103 mmol/L (ref 101–111)
CO2: 24 mmol/L (ref 22–32)
CREATININE: 1.12 mg/dL (ref 0.61–1.24)
Calcium: 8.6 mg/dL — ABNORMAL LOW (ref 8.9–10.3)
Glucose, Bld: 228 mg/dL — ABNORMAL HIGH (ref 65–99)
Potassium: 4.2 mmol/L (ref 3.5–5.1)
SODIUM: 133 mmol/L — AB (ref 135–145)

## 2016-06-25 LAB — GLUCOSE, CAPILLARY
GLUCOSE-CAPILLARY: 215 mg/dL — AB (ref 65–99)
Glucose-Capillary: 191 mg/dL — ABNORMAL HIGH (ref 65–99)

## 2016-06-25 LAB — MAGNESIUM: Magnesium: 1.6 mg/dL — ABNORMAL LOW (ref 1.7–2.4)

## 2016-06-25 LAB — T4, FREE: FREE T4: 1.23 ng/dL — AB (ref 0.61–1.12)

## 2016-06-25 MED ORDER — INSULIN ASPART 100 UNIT/ML ~~LOC~~ SOLN
5.0000 [IU] | Freq: Three times a day (TID) | SUBCUTANEOUS | Status: DC
  Administered 2016-06-25 (×2): 5 [IU] via SUBCUTANEOUS

## 2016-06-25 MED ORDER — INSULIN ASPART 100 UNIT/ML ~~LOC~~ SOLN: 0.0000 [IU] | Freq: Every day | SUBCUTANEOUS | Status: DC

## 2016-06-25 MED ORDER — INSULIN GLARGINE 100 UNIT/ML ~~LOC~~ SOLN
15.0000 [IU] | Freq: Two times a day (BID) | SUBCUTANEOUS | Status: DC
  Administered 2016-06-25: 15 [IU] via SUBCUTANEOUS
  Filled 2016-06-25 (×7): qty 0.15

## 2016-06-25 MED ORDER — INSULIN ASPART 100 UNIT/ML ~~LOC~~ SOLN: SUBCUTANEOUS | 11 refills | Status: DC

## 2016-06-25 MED ORDER — POTASSIUM CHLORIDE CRYS ER 10 MEQ PO TBCR: 10.0000 meq | EXTENDED_RELEASE_TABLET | Freq: Every day | ORAL | 1 refills | Status: DC

## 2016-06-25 MED ORDER — MAGNESIUM OXIDE 400 (241.3 MG) MG PO TABS
400.0000 mg | ORAL_TABLET | Freq: Every day | ORAL | Status: DC
  Administered 2016-06-25: 400 mg via ORAL
  Filled 2016-06-25: qty 1

## 2016-06-25 MED ORDER — OXYCODONE-ACETAMINOPHEN 5-325 MG PO TABS: 1.0000 | ORAL_TABLET | Freq: Four times a day (QID) | ORAL | 0 refills | Status: DC | PRN

## 2016-06-25 MED ORDER — INSULIN ASPART 100 UNIT/ML ~~LOC~~ SOLN
0.0000 [IU] | Freq: Three times a day (TID) | SUBCUTANEOUS | Status: DC
  Administered 2016-06-25: 4 [IU] via SUBCUTANEOUS
  Administered 2016-06-25: 7 [IU] via SUBCUTANEOUS

## 2016-06-25 MED ORDER — INSULIN GLARGINE 100 UNIT/ML ~~LOC~~ SOLN: 40.0000 [IU] | Freq: Every day | SUBCUTANEOUS | 11 refills | Status: DC

## 2016-06-25 MED ORDER — MAGNESIUM OXIDE 400 (241.3 MG) MG PO TABS: 400.0000 mg | ORAL_TABLET | Freq: Every day | ORAL | Status: DC

## 2016-06-25 NOTE — Discharge Summary (Signed)
Physician Discharge Summary  Lucas Morgan ZOX:[id number] DOB: [birth year] DOA: 06/21/2016  PCP: [facility]  Admit date: 06/21/2016 Discharge date: 06/25/2016  Time spent: Greater than 30 minutes  Recommendations for Outpatient Follow-up:  1. Recommend referral to outpatient urology in [place] for further evaluation and management of urinary retention. Patient is being discharged with the indwelling Foley catheter in place. 2. Patient is being discharged to [facility] in [place].    Discharge Diagnoses:  1. Acute encephalopathy, secondary to hyperglycemia. 2. History of multiple cortical infarcts. Recent outpatient loop recorder failed to reveal for fibrillation. 3. Type 2 diabetes mellitus with hyperosmolar hyperglycemia. Patient's hemoglobin A1c was 8.2. 4. Urinary tract infection. 5. Elevated troponin I in the setting of CAD and chronic stable combined CHF; thought to be secondary to mild demand ischemia. 6. 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 7. Right ICA carotid artery stenosis and history of left carotid endarterectomy. 8. Acute kidney injury superimposed on stage III chronic kidney disease. 9. Hypokalemia and hypomagnesemia. 10. Chronic pain syndrome, on Percocet. 11. Radiographic evidence of chronic interstitial lung disease. 12. 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 13. Urinary retention, with a history of BPH.    Discharge Condition: Improved  Diet recommendation: Carbohydrate modified/heart healthy  Filed Weights   06/23/16 0500 06/24/16 0500 06/25/16 0500  Weight: 70.6 kg (155 lb 10.3 oz) 70.6 kg (155 lb 10.3 oz) 69.1 kg (152 lb 5.4 oz)    History of present illness:  Patient is a 75 year old man with a history of previous left brain CVA, dementia, CKD, chronic pain-on chronic opiates, combined CHF, CAD, carotid artery disease, and diabetes mellitus,  who presented on 06/21/16 with altered mental status and visual hallucinations. In the ED, he was tachycardic, but with a normal blood pressure. He was afebrile. His venous glucose was 583. His anion gap was normal. His urine drug screen was positive for opiates. His UA revealed many bacteria. His white blood cell count was 14.1. Troponin I was slightly elevated at 0.06. Sodium was 131. Head CT revealed no acute intracranial abnormality, but with stable left frontal and left occipital lobe encephalomalacia and chronic small vessel disease. Chest x-ray revealed no acute abnormality, but with chronic interstitial disease. He was admitted for further evaluation and management.   Hospital Course:  1. Altered mental status/acute encephalopathy with reported hallucinations. The patient was started on supportive treatment and gentle IV fluids and treatment for accelerated hyperglycemia. Rocephin was started empirically for what was thought to be a UTI. Diagnostic studies revealed an echo with an EF of 35-40% which was virtually stable compared to TEE 2016. Carotid ultrasound revealed stable 50-69% right ICA stenosis/no left carotid artery stenosis following prior endarterectomy. -TSH was ordered and was modestly low at 0.26. Free T4 ordered and is pending. His Vitamin B12 level was within normal limits.  Neurology was consulted and believed that the patient's encephalopathy was likely from severe hyperglycemia and volume depletion. -With treatment of his hyperglycemia, the patient's encephalopathy has resolved. This was the likely etiology of his acute encephalopathy. Of note, the patient probably has some mild dementia given some episodes of what appears to be sundowning, but the patient was never agitated or belligerent.  History of multiple cortical infarcts. Patient had a recent loop recorder which failed to reveal atrial fibrillation. He was continued on Plavix and aspirin.  Type 2 diabetes with  hyperosmolar hyperglycemia. The patient is treated  with metformin and Lantus chronically. His venous glucose was greater than 580 on admission. He was started on an insulin drip. His blood glucose improved quickly. His CBGs started running on the low side of normal, so Lantus and sliding scale NovoLog were decreased. -His A1c was found to be 8.2. -Home dosing of Lantus and metformin were resumed at the time of discharge. Sliding scale NovoLog was added.  Urinary retention with history of BPH. Patient was restarted on Flomax. Indwelling Foley catheter was inserted following admission as the patient had some urinary retention. It was discontinued for a voiding trial. He was able to void spontaneously for couple days, but then developed urinary retention again. Indwelling Foley catheter was placed again prior to discharge. -Recommend outpatient referral to urology in [place] at the skilled nursing facility for further evaluation and management.  UTI. The patient's urinalysis revealed many bacteria. His white blood cell count was 15.8 on admission.  He was started on Rocephin which was eventually broadened to Zosyn and vancomycin empirically. -Blood cultures are negative to date. Urine culture revealed no growth. MRSA screen was positive.  -His white blood cell count normalized and he remained afebrile. -I do not believe the patient had sepsis on admission or now. -Antibiotic therapy was narrowed back to Rocephin and vancomycin. -Antibiotics were discontinued at the time of discharge after 5 days of therapy. There was no evidence of an active infection at the time of discharge.  Elevated troponin I in the setting of CAD and chronic combined CHF.  Patient's troponin I was 0.10 on admission and reached a high of 0.23. It has since trended down. Patient gave no endorsement of chest pain. Echo noted for known reduced EF. EKG revealed stable right bundle branch block but with a few PVCs. -Patient was  continued on Plavix, aspirin, and carvedilol. Lisinopril was held due to AKI. -The etiology of the elevation was likely from infection and acute on chronic renal failure.  Acute kidney injury superimposed on stage III chronic kidney disease. Per chart review, the patient's baseline creatinine appears to range from 1.3-1.4. It was 1.88 on admission. -Lisinopril was withheld. He was started on gentle IV fluids. His creatinine improved to baseline.  Hypokalemia and hypomagnesemia. Patient's serum potassium was 3.2 on admission. He was given potassium chloride. His serum potassium has improved. Magnesium level was assessed and was low at 1.3. He was given 1 g of magnesium sulfate with improvement. He was discharged on magnesium oxide and potassium chloride supplementation.  Chronic pain syndrome. Percocet every 6 hours as needed was restarted.   Pulmonary crackles, secondary to chronic interstitial lung disease. Due to of the pulmonary crackles auscultated on exam, a chest x-ray was ordered on 06/23/16. It revealed stable chronic mild interstitial prominence, but no infiltrate or edema.  Deconditioning. PT/OT evaluated the patient and recommended SNF placement.    Procedures: -2-D echo 06/22/16:Study Conclusions - Left ventricle: The cavity size was normal. There was mild concentric hypertrophy. Systolic function was moderately reduced. The estimated ejection fraction was in the range of 35% to 40%. Doppler parameters are consistent with abnormal left ventricular relaxation (grade 1 diastolic dysfunction). Indeterminate filling pressures. - Regional wall motion abnormality: Akinesis of the mid inferior myocardium; severe hypokinesis of the basal inferolateral myocardium. Diffuse hypokinesis also noted. - Aortic valve: Mildly to moderately calcified annulus. Mildly thickened, mildly calcified leaflets. Sclerosis without stenosis. - Mitral valve: Calcified annulus.  Mildly thickened leaflets . There was mild regurgitation. - Left atrium: The atrium was  mildly to moderately dilated.  Consultations:  Neurologist, Dr. Gerilyn Pilgrim  Discharge Exam: Vitals:   06/25/16 0733 06/25/16 0836  BP:  124/69  Pulse: 89 (!) 102  Resp: (!) 22   Temp: 99.2 F (37.3 C)   O2 sat 96% on room air.  General exam: Elderly alert 75 year old Caucasian man who appears calm and comfortable  Respiratory system: A few fine bilaterally Respiratory effort normal. Cardiovascular system: S1 & S2 with borderline tachycardia. No JVD, murmurs, rubs, gallops or clicks. No pedal edema. Gastrointestinal system: Abdomen is nondistended, soft and nontender. No organomegaly or masses felt. Normal bowel sounds heard. Central nervous system: Alert and oriented  2. No focal neurological deficits. Extremities: Symmetric 5 x 5 power. Skin: No rashes, lesions or ulcers   Discharge Instructions    Current Discharge Medication List    START taking these medications   Details  insulin aspart (NOVOLOG) 100 UNIT/ML injection Sliding scale 3 times daily: For blood sugar 70-120 do not give insulin. Blood sugar 121-150 give 2 units. Blood sugar 151-200 give 3 units. Blood sugar 201-250 give 6 units. Blood sugar 251-300 give 8 units. Blood sugar 301-350 give 10 units. Blood sugar 351-400 give 12 units. Blood sugar greater than 400 give 14 units and call your doctor. Qty: 10 mL, Refills: 11    magnesium oxide (MAG-OX) 400 (241.3 Mg) MG tablet Take 1 tablet (400 mg total) by mouth daily.    potassium chloride SA (K-DUR,KLOR-CON) 10 MEQ tablet Take 1 tablet (10 mEq total) by mouth daily. Qty: 30 tablet, Refills: 1      CONTINUE these medications which have CHANGED   Details  insulin glargine (LANTUS) 100 UNIT/ML injection Inject 0.4 mLs (40 Units total) into the skin at bedtime. Qty: 10 mL, Refills: 11    oxyCODONE-acetaminophen (PERCOCET/ROXICET) 5-325 MG tablet Take 1-2 tablets by mouth  every 6 (six) hours as needed. Qty: 20 tablet, Refills: 0      CONTINUE these medications which have NOT CHANGED   Details  aspirin 81 MG tablet Take 1 tablet (81 mg total) by mouth daily.    atorvastatin (LIPITOR) 80 MG tablet Take 1 tablet (80 mg total) by mouth daily. Qty: 90 tablet, Refills: 3    bisacodyl (DULCOLAX) 5 MG EC tablet Take 5 mg by mouth daily as needed for moderate constipation.    carvedilol (COREG) 3.125 MG tablet Take 1 tablet (3.125 mg total) by mouth 2 (two) times daily with a meal. Qty: 60 tablet, Refills: 0    clopidogrel (PLAVIX) 75 MG tablet Take 1 tablet (75 mg total) by mouth daily. Qty: 30 tablet, Refills: 0    escitalopram (LEXAPRO) 5 MG tablet Take 5 mg by mouth daily.    gabapentin (NEURONTIN) 100 MG capsule TAKE 3 CAPSULES(300 MG) BY MOUTH THREE TIMES DAILY Qty: 90 capsule, Refills: 3    Liniments (BLUE-EMU SUPER STRENGTH) CREA Apply topically.    lisinopril (PRINIVIL,ZESTRIL) 5 MG tablet Take 5 mg by mouth daily.    metFORMIN (GLUCOPHAGE) 500 MG tablet Take 250 mg by mouth 2 (two) times daily with a meal.    pantoprazole (PROTONIX) 40 MG tablet Take 40 mg by mouth daily.    tamsulosin (FLOMAX) 0.4 MG CAPS capsule Take 1 capsule (0.4 mg total) by mouth daily. Qty: 30 capsule, Refills: 0      STOP taking these medications     cyclobenzaprine (FLEXERIL) 5 MG tablet        Allergies  Allergen Reactions  . Trazodone  And Nefazodone Other (See Comments)    High blood sugar   Contact information for after-discharge care    Destination    [facility] .   Specialty:  Skilled Nursing Facility Contact information: [address] [place] [id number] [phone]               The results of significant diagnostics from this hospitalization (including imaging, microbiology, ancillary and laboratory) are listed below for reference.    Significant Diagnostic Studies: Dg Chest 1 View  Result Date:  06/23/2016 CLINICAL DATA:  Chest congestion EXAM: CHEST 1 VIEW COMPARISON:  06/22/2016 and 04/09/2016 FINDINGS: Cardiomediastinal silhouette is stable. Chronic mild interstitial prominence again noted. No infiltrate or pleural effusion. No pulmonary edema. Degenerative changes thoracic spine again noted. There is mild progression of T11 vertebral body compression fracture. Clinical correlation is necessary. IMPRESSION: Chronic mild interstitial prominence again noted. No infiltrate or pleural effusion. No pulmonary edema. Degenerative changes thoracic spine again noted. There is mild progression of T11 vertebral body compression fracture. Clinical correlation is necessary. Electronically Signed   By: Natasha MeadLiviu  Pop M.D.   On: 06/23/2016 16:33   Dg Chest 2 View  Result Date: 06/22/2016 CLINICAL DATA:  Leukocytosis.  Diabetes. EXAM: CHEST  2 VIEW COMPARISON:  06/21/2016. FINDINGS: Cardiomegaly. Chronic lung markings but no consolidation. Loop recorder. Thoracic atherosclerosis. No effusion or pneumothorax. Osteopenia. Improved aeration from priors. IMPRESSION: No active infiltrates or failure. Electronically Signed   By: Elsie StainJohn T Curnes M.D.   On: 06/22/2016 14:33   Dg Chest 2 View  Result Date: 06/21/2016 CLINICAL DATA:  Altered mental status.  The patient fell today. EXAM: CHEST  2 VIEW COMPARISON:  04/09/2016 FINDINGS: The heart size and mediastinal contours are within normal limits. Chronic accentuation of the interstitial markings. No acute infiltrates or effusions. No acute bone abnormality. IMPRESSION: No acute abnormality.  Chronic interstitial disease. Electronically Signed   By: Francene BoyersJames  Maxwell M.D.   On: 06/21/2016 13:01   Ct Head Wo Contrast  Result Date: 06/21/2016 CLINICAL DATA:  Altered mental status. Fall. Posterior head injury. EXAM: CT HEAD WITHOUT CONTRAST TECHNIQUE: Contiguous axial images were obtained from the base of the skull through the vertex without intravenous contrast. COMPARISON:   01/13/2016 head CT. FINDINGS: Brain: No evidence of parenchymal hemorrhage or extra-axial fluid collection. No mass lesion, mass effect, or midline shift. No CT evidence of acute infarction. Stable encephalomalacia in the left frontal and left occipital lobes. Intracranial atherosclerosis. Nonspecific mild to moderate subcortical and periventricular white matter hypodensity, most in keeping with chronic small vessel ischemic change. Underlying generalized cerebral volume loss. Cerebral ventricle sizes are stable and concordant with the degree of cerebral volume loss. Vascular: No hyperdense vessel or unexpected calcification. Skull: No evidence of calvarial fracture. Sinuses/Orbits: The visualized paranasal sinuses are essentially clear. Stable chronic atrophy and calcification of the left globe. Other:  The mastoid air cells are unopacified. IMPRESSION: 1. No evidence of acute intracranial abnormality. No evidence of calvarial fracture. 2. Stable left frontal and left occipital lobe encephalomalacia. 3. Mild-to-moderate chronic small vessel ischemia. Electronically Signed   By: Delbert PhenixJason A Poff M.D.   On: 06/21/2016 13:08   [place] Carotid Bilateral  Result Date: 06/22/2016 CLINICAL DATA:  Mental status changes, hyperlipidemia, diabetes, coronary artery disease and history of stroke. History of prior left carotid on 06/17/2015. EXAM: BILATERAL CAROTID DUPLEX ULTRASOUND TECHNIQUE: Wallace CullensGray scale imaging, color Doppler and duplex ultrasound were performed of bilateral carotid and vertebral arteries in the neck. COMPARISON:  01/15/2016 FINDINGS: Criteria: Quantification of carotid stenosis is based on velocity parameters that correlate the residual internal carotid diameter with NASCET-based stenosis levels, using the diameter of the distal internal carotid lumen as the denominator for stenosis measurement. The following velocity measurements were obtained: RIGHT ICA:  176/28 cm/sec CCA:  84/13 cm/sec SYSTOLIC ICA/CCA RATIO:   2.6 DIASTOLIC ICA/CCA RATIO:  2.7 ECA:  130 seconds cm/sec LEFT ICA:  63/15 cm/sec CCA:  94/14 cm/sec SYSTOLIC ICA/CCA RATIO:  0.8 DIASTOLIC ICA/CCA RATIO:  0.9 ECA:  82 cm/sec RIGHT CAROTID ARTERY: Moderate amount of partially calcified plaque again noted at the level of the carotid bulb and proximal ICA. Based on velocities today, estimated right ICA stenosis is 50- 69%. RIGHT VERTEBRAL ARTERY: Antegrade flow with normal waveform and velocity. LEFT CAROTID ARTERY: No significant left-sided plaque identified after prior endarterectomy. No evidence of carotid stenosis on the left. LEFT VERTEBRAL ARTERY: Antegrade flow with normal waveform and velocity. IMPRESSION: Moderate partially calcified plaque at the right carotid bifurcation again noted with estimated 50- 69% right ICA stenosis. No evidence of left carotid stenosis following prior endarterectomy. Electronically Signed   By: Irish Lack M.D.   On: 06/22/2016 15:43    Microbiology: Recent Results (from the past 240 hour(s))  Urine culture     Status: None   Collection Time: 06/21/16 12:25 PM  Result Value Ref Range Status   Specimen Description URINE, RANDOM  Final   Special Requests NONE  Final   Culture NO GROWTH Performed at [facility]   Final   Report Status 06/23/2016 FINAL  Final  MRSA PCR Screening     Status: Abnormal   Collection Time: 06/21/16  4:35 PM  Result Value Ref Range Status   MRSA by PCR POSITIVE (A) NEGATIVE Final    Comment:        The GeneXpert MRSA Assay (FDA approved for NASAL specimens only), is one component of a comprehensive MRSA colonization surveillance program. It is not intended to diagnose MRSA infection nor to guide or monitor treatment for MRSA infections. RESULT CALLED TO, READ BACK BY AND VERIFIED WITH: HEARN,J ON 06/21/16 AT 2300 BY LOY,C   Culture, blood (x 2)     Status: None (Preliminary result)   Collection Time: 06/22/16  4:28 PM  Result Value Ref Range Status   Specimen  Description BLOOD LEFT ANTECUBITAL  Final   Special Requests BOTTLES DRAWN AEROBIC AND ANAEROBIC 10CC EACH  Final   Culture NO GROWTH 2 DAYS  Final   Report Status PENDING  Incomplete  Culture, blood (x 2)     Status: None (Preliminary result)   Collection Time: 06/22/16  4:32 PM  Result Value Ref Range Status   Specimen Description BLOOD LEFT HAND  Final   Special Requests BOTTLES DRAWN AEROBIC AND ANAEROBIC 5CC EACH  Final   Culture NO GROWTH 2 DAYS  Final   Report Status PENDING  Incomplete     Labs: Basic Metabolic Panel:  Recent Labs Lab 06/21/16 1235 06/21/16 1953 06/22/16 0150 06/23/16 0158 06/24/16 0449 06/25/16 0427  NA  --  136 135 135 135 133*  K  --  3.2* 3.3* 3.4* 3.8 4.2  CL  --  104 107 108 103 103  CO2  --  22 22 23 25 24   GLUCOSE  --  146* 103* 166* 223* 228*  BUN  --  27* 27* 25* 13 14  CREATININE  --  1.36* 1.33* 1.43* 1.17 1.12  CALCIUM  --  8.6* 8.7* 7.6* 8.3* 8.6*  MG 1.1*  --   --   --  1.3* 1.6*   Liver Function Tests:  Recent Labs Lab 06/21/16 1218 06/22/16 1400 06/23/16 0158  AST 28 38 28  ALT 18 20 16*  ALKPHOS 79 66 48  BILITOT 1.7* 1.0 0.7  PROT 6.9 6.9 5.1*  ALBUMIN 3.9 3.6 2.6*   No results for input(s): LIPASE, AMYLASE in the last 168 hours. No results for input(s): AMMONIA in the last 168 hours. CBC:  Recent Labs Lab 06/21/16 1224 06/22/16 0150 06/23/16 0158 06/24/16 0449 06/25/16 0427  WBC 14.1* 15.8* 9.8 8.6 9.9  NEUTROABS 11.0*  --   --   --   --   HGB 14.0 13.0 10.5* 12.5* 12.6*  HCT 39.5 36.4* 29.9* 36.0* 36.0*  MCV 92.1 90.8 92.0 93.3 93.0  PLT 194 207 158 183 215   Cardiac Enzymes:  Recent Labs Lab 06/22/16 0150 06/22/16 0805 06/22/16 1628 06/22/16 1935 06/23/16 0158  TROPONINI 0.24* 0.23* 0.16* 0.14* 0.13*   BNP: BNP (last 3 results)  Recent Labs  06/23/16 0158  BNP 105.0*    ProBNP (last 3 results) No results for input(s): PROBNP in the last 8760 hours.  CBG:  Recent Labs Lab  06/24/16 0730 06/24/16 1112 06/24/16 1620 06/24/16 2057 06/25/16 0730  GLUCAP 198* 260* 252* 286* 215*       Signed:  Edom Schmuhl MD.  [facility] 06/25/2016, 9:14 AM

## 2016-06-25 NOTE — NC FL2 (Signed)
[place] MEDICAID FL2 LEVEL OF CARE SCREENING TOOL     IDENTIFICATION  Patient Name: Lucas Morgan Birthdate: 14-Apr-1941 Sex: male Admission Date (Current Location): 06/21/2016  [facility] and [place] Number:      Facility and Address:         Provider Number: [id number]  Attending Physician Name and Address:  Elliot Cousin, MD  Relative Name and Phone Number:       Current Level of Care: Hospital Recommended Level of Care: Skilled Nursing Facility Prior Approval Number:    Date Approved/Denied:   PASRR Number:    Discharge Plan: SNF    Current Diagnoses: Patient Active Problem List   Diagnosis Date Noted  . Chronic interstitial lung disease (HCC) 06/24/2016  . Acute encephalopathy 06/23/2016  . Hypokalemia 06/23/2016  . AKI (acute kidney injury) (HCC)   . Altered mental status 06/21/2016  . Hypomagnesemia 06/21/2016  . Elevated troponin 06/21/2016  . Left leg weakness   . Acute CVA (cerebrovascular accident) (HCC) 01/14/2016  . Fall at home 01/13/2016  . Ataxia 01/13/2016  . Orthostatic hypotension 01/13/2016  . Hip pain, acute   . 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 Tremor of both hands 11/18/2015  . HLD (hyperlipidemia) 11/18/2015  . Type 2 diabetes mellitus with stage 2 chronic kidney disease, with long-term current use of insulin (HCC) 08/15/2015  . S/P carotid endarterectomy 08/15/2015  . Acute on chronic systolic congestive heart failure (HCC) 08/15/2015  . Cardiomyopathy, ischemic 06/18/2015  . Cerebral infarction due to unspecified mechanism   . Chronic pain syndrome 06/11/2015  . Adjustment disorder with anxious mood 06/11/2015  . Aphasia S/P CVA 05/28/2015  . Right hemiparesis (HCC) 05/28/2015  . Stroke, acute, embolic (HCC) 05/27/2015  . Carotid stenosis   . Acute on chronic renal failure (HCC) 05/24/2015  . CVA (cerebral infarction) 05/24/2015  . Leukocytosis 05/24/2015  . Foreign body in colon  04/08/2015  . Spinal stenosis of lumbar region 10/18/2014  . Lumbar stenosis with neurogenic claudication 10/18/2014  . Thyroid nodule 03/12/2013  . AAA (abdominal aortic aneurysm) (HCC) 03/12/2013  . PULMONARY NODULE 08/14/2010  . COUGH 06/30/2010  . HYPOTENSION 05/04/2010  . AODM 01/27/2009  . CAD, NATIVE VESSEL 12/26/2008    Orientation RESPIRATION BLADDER Height & Weight     Self, Situation, Place  Normal Indwelling catheter Weight: 152 lb 5.4 oz (69.1 kg) Height:  5\' 8"  (172.7 cm)  BEHAVIORAL SYMPTOMS/MOOD NEUROLOGICAL BOWEL NUTRITION STATUS      Continent Diet (Carb Modified)  AMBULATORY STATUS COMMUNICATION OF NEEDS Skin   Limited Assist Verbally Normal                       Personal Care Assistance Level of Assistance  Bathing, Feeding, Dressing Bathing Assistance: Limited assistance Feeding assistance: Independent Dressing Assistance: Limited assistance     Functional Limitations Info  Sight, Hearing, Speech Sight Info: Adequate Hearing Info: Adequate Speech Info: Adequate    SPECIAL CARE FACTORS FREQUENCY                       Contractures Contractures Info: Not present    Additional Factors Info  Code Status, Allergies, Isolation Precautions Code Status Info: Full Codd Allergies Info: Trazadone and Nefazodone     Isolation Precautions Info: 06/21/16 mrsa by pcr     Current Medications (06/25/2016):  This is the current hospital active medication list Current Facility-Administered Medications  Medication  Dose Route Frequency Provider Last Rate Last Dose  . 0.9 %  sodium chloride infusion   Intravenous Continuous Elliot Cousin, MD 10 mL/hr at 06/23/16 0924 10 mL at 06/23/16 0924  . acetaminophen (TYLENOL) tablet 650 mg  650 mg Oral Q6H PRN Jonah Blue, MD   650 mg at 06/22/16 1308   Or  . acetaminophen (TYLENOL) suppository 650 mg  650 mg Rectal Q6H PRN Jonah Blue, MD      . aspirin chewable tablet 81 mg  81 mg Oral Daily Jonah Blue, MD   81 mg at 06/24/16 1122  . atorvastatin (LIPITOR) tablet 80 mg  80 mg Oral q1800 Jonah Blue, MD   80 mg at 06/24/16 1713  . bisacodyl (DULCOLAX) EC tablet 5 mg  5 mg Oral Daily PRN Jonah Blue, MD      . carvedilol (COREG) tablet 3.125 mg  3.125 mg Oral BID WC Briscoe Deutscher, MD   3.125 mg at 06/25/16 0836  . cefTRIAXone (ROCEPHIN) 1 g in dextrose 5 % 50 mL IVPB  1 g Intravenous Q24H Elliot Cousin, MD   1 g at 06/25/16 0839  . Chlorhexidine Gluconate Cloth 2 % PADS 6 each  6 each Topical Q0600 Jonah Blue, MD   6 each at 06/24/16 0600  . citalopram (CELEXA) tablet 20 mg  20 mg Oral Daily Jonah Blue, MD   20 mg at 06/24/16 1123  . clopidogrel (PLAVIX) tablet 75 mg  75 mg Oral Daily Jonah Blue, MD   75 mg at 06/24/16 1121  . cyclobenzaprine (FLEXERIL) tablet 5 mg  5 mg Oral Daily PRN Jonah Blue, MD      . enoxaparin (LOVENOX) injection 40 mg  40 mg Subcutaneous Q24H Jonah Blue, MD   40 mg at 06/24/16 2335  . feeding supplement (ENSURE ENLIVE) (ENSURE ENLIVE) liquid 237 mL  237 mL Oral BID BM Kathlen Mody, MD   237 mL at 06/24/16 1714  . gabapentin (NEURONTIN) capsule 300 mg  300 mg Oral TID Jonah Blue, MD   300 mg at 06/24/16 2335  . insulin aspart (novoLOG) injection 0-20 Units  0-20 Units Subcutaneous TID WC Elliot Cousin, MD   7 Units at 06/25/16 [phone]  . insulin aspart (novoLOG) injection 0-5 Units  0-5 Units Subcutaneous QHS Elliot Cousin, MD      . insulin aspart (novoLOG) injection 5 Units  5 Units Subcutaneous TID WC Elliot Cousin, MD   5 Units at 06/25/16 0844  . insulin glargine (LANTUS) injection 15 Units  15 Units Subcutaneous BID Elliot Cousin, MD      . magnesium oxide (MAG-OX) tablet 400 mg  400 mg Oral Daily Elliot Cousin, MD      . mupirocin ointment (BACTROBAN) 2 % 1 application  1 application Nasal BID Jonah Blue, MD   1 application at 06/24/16 2334  . ondansetron (ZOFRAN) tablet 4 mg  4 mg Oral Q6H PRN Jonah Blue, MD       Or  .  ondansetron [facility]) injection 4 mg  4 mg Intravenous Q6H PRN Jonah Blue, MD      . oxyCODONE-acetaminophen (PERCOCET/ROXICET) 5-325 MG per tablet 1-2 tablet  1-2 tablet Oral Q6H PRN Jonah Blue, MD   1 tablet at 06/22/16 2023  . pantoprazole (PROTONIX) EC tablet 40 mg  40 mg Oral Daily Jonah Blue, MD   40 mg at 06/24/16 1119  . potassium chloride SA (K-DUR,KLOR-CON) CR tablet 20 mEq  20 mEq Oral BID Elliot Cousin, MD  20 mEq at 06/24/16 2335  . sodium chloride flush (NS) 0.9 % injection 3 mL  3 mL Intravenous Q12H Jonah BlueJennifer Yates, MD   3 mL at 06/24/16 2337  . tamsulosin (FLOMAX) capsule 0.4 mg  0.4 mg Oral Daily Jonah BlueJennifer Yates, MD   0.4 mg at 06/24/16 1122  . vancomycin (VANCOCIN) 500 mg in sodium chloride 0.9 % 100 mL IVPB  500 mg Intravenous Q12H Kathlen ModyVijaya Akula, MD   500 mg at 06/25/16 0139     Discharge Medications: Please see discharge summary for a list of discharge medications.  Relevant Imaging Results:  Relevant Lab Results:   Additional Information SS#: [id number]  Karn CassisStultz, [facility] Wisby [place], [place] [phone]

## 2016-06-25 NOTE — Progress Notes (Signed)
[facility]. Pt left unit via wheelchair to daughters vehicle [facility]

## 2016-06-25 NOTE — Clinical Social Work Placement (Signed)
   CLINICAL SOCIAL WORK PLACEMENT  NOTE  Date:  06/25/2016  Patient Details  Name: Lucas Morgan MRN: [id number] Date of Birth: 02/11/1941  Clinical Social Work is seeking post-discharge placement for this patient at the Skilled  Nursing Facility level of care (*CSW will initial, date and re-position this form in  chart as items are completed):  Yes   Patient/family provided with [facility] Clinical Social Work Department's list of facilities offering this level of care within the geographic area requested by the patient (or if unable, by the patient's family).  Yes   Patient/family informed of their freedom to choose among providers that offer the needed level of care, that participate in Medicare, Medicaid or managed care program needed by the patient, have an available bed and are willing to accept the patient.  Yes   Patient/family informed of [facility]'s ownership interest in [facility] and [facility], as well as of the fact that they are under no obligation to receive care at these facilities.  PASRR submitted to EDS on       PASRR number received on       Existing PASRR number confirmed on       FL2 transmitted to all facilities in geographic area requested by pt/family on 06/22/16     FL2 transmitted to all facilities within larger geographic area on       Patient informed that his/her managed care company has contracts with or will negotiate with certain facilities, including the following:        Yes   Patient/family informed of bed offers received.  [facility]     Physician recommends and patient chooses bed at      [facility] 06/25/16.  Patient to be transferred to facility by Family     Patient family notified on 06/25/16 of transfer.  Name of family member notified:  [name]- daughter     PHYSICIAN       Additional Comment:     _______________________________________________ Karn CassisStultz, Joycie Aerts [place], LCSW 06/25/2016, 10:16 AM [phone]

## 2016-06-27 LAB — CULTURE, BLOOD (ROUTINE X 2)
CULTURE: NO GROWTH
Culture: NO GROWTH

## 2016-07-07 ENCOUNTER — Telehealth: Payer: Self-pay | Admitting: Cardiology

## 2016-07-07 NOTE — Telephone Encounter (Signed)
Spoke w/ pt daughter and she informed me that pt is at a facility for rehab. It is temporary and his monitor is still at home. Pt daughter is aware that when pt returns home she will need to send a manual transmission with his home monitor.

## 2016-07-16 ENCOUNTER — Encounter: Payer: Self-pay | Admitting: Cardiology

## 2016-07-19 ENCOUNTER — Ambulatory Visit (INDEPENDENT_AMBULATORY_CARE_PROVIDER_SITE_OTHER): Payer: Medicare Other | Admitting: *Deleted

## 2016-07-19 DIAGNOSIS — I639 Cerebral infarction, unspecified: Secondary | ICD-10-CM | POA: Diagnosis not present

## 2016-07-20 NOTE — Progress Notes (Signed)
[employer] Summary Report / Loop Recorder 

## 2016-07-25 LAB — CUP PACEART REMOTE DEVICE CHECK
Date Time Interrogation Session: 20171007181032
MDC IDC PG IMPLANT DT: 20160912

## 2016-07-25 NOTE — Progress Notes (Signed)
Carelink summary report received. Battery status OK. Normal device function. No new symptom episodes, brady, or pause episodes. No new AF episodes. 65 tachy episodes, oversensing. Monthly summary reports and ROV/PRN

## 2016-08-18 ENCOUNTER — Ambulatory Visit: Payer: Medicare Other | Admitting: Cardiology

## 2016-08-18 ENCOUNTER — Encounter: Payer: Medicare Other | Admitting: *Deleted

## 2016-08-18 NOTE — Progress Notes (Deleted)
Clinical Summary [name] is a 75 y.o.male seen today for follow up of the following medical problems.    1. CAD/ICM/Chronic systolic HF - prior inferior MI 10/2008 with DES to LCX and staged PCI of the LAD with a DES.  - last cath 12/2009 with patent stents, severe diagonal disease that was opened with cutting balloon angioplasty. - echo 05/2014 LVEF 50-55%, abnormal diastolic function, multiple WMAs.  - 05/2015 TEE LVEF 30-35% - 06/2015 TTE LVEF 40-45%, grade I diastolic dysfunction - echo 06/2016 LVEF 35-40%, grade I diastolic dysfunction, multiple WMAs - medications for CHF limited due to soft bp's. From neuro notes trying to keep bp reasonable due to continued obstructive cerebral vascular disease.    - no SOB or DOE. No LE edema. No recent chest pain. Limiting sodium intake. Compliant with meds. Avoiding NSAIDS  2. Hyperlipidemia - compliant with statin  3. Abdominal aortic aneurysm - incidental finding on lumbar MRI 02/2013 - AAA US 09/2014 2.9 x 2.9 cm - AAA US 09/2015 2.9 x 2.9 cm  - denies any recent abdominal symptoms  4. 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 He subsequently had left carotid endarectomy done as this was the likely etiology. TEE without source of emboli, loop recorder placed and has had no significant arrhythmias.  - he is on ASA and plavix for secondary prevention per neuro   5. Carotid stenosis - s/p left CEA 06/2015 in setting of CVA. He continues to be followed by vascular.  - 06/2016 carotis US with 50-69% RICA, no significant left sided disease.    Past Medical History:  Diagnosis Date  . AAA (abdominal aortic aneurysm) (HCC)    a. Abd U/S 7/14: mild aneurysmal dilatation 3x3 cm; cholelithiasis without evid of cholecystitis => repeat 1 year  . Arthritis    stenosis, lumbar region  . CHF (congestive heart failure) (HCC)   . Chronic interstitial lung disease (HCC) 06/24/2016  . Coronary artery disease   . Diabetes  mellitus   . Dyslipidemia   . MI (myocardial infarction) 11/09/2008   2.5 x 23 Xience V DES to the CFX  . Osteoporosis   . Renal insufficiency   . Stroke [facility](HCC)      Allergies  Allergen Reactions  . Trazodone And Nefazodone Other (See Comments)    High blood sugar     Current Outpatient Prescriptions  Medication Sig Dispense Refill  . aspirin 81 MG tablet Take 1 tablet (81 mg total) by mouth daily.    Marland Kitchen. atorvastatin (LIPITOR) 80 MG tablet Take 1 tablet (80 mg total) by mouth daily. 90 tablet 3  . bisacodyl (DULCOLAX) 5 MG EC tablet Take 5 mg by mouth daily as needed for moderate constipation.    . carvedilol (COREG) 3.125 MG tablet Take 1 tablet (3.125 mg total) by mouth 2 (two) times daily with a meal. 60 tablet 0  . clopidogrel (PLAVIX) 75 MG tablet Take 1 tablet (75 mg total) by mouth daily. 30 tablet 0  . escitalopram (LEXAPRO) 5 MG tablet Take 5 mg by mouth daily.    Marland Kitchen. gabapentin (NEURONTIN) 100 MG capsule TAKE 3 CAPSULES(300 MG) BY MOUTH THREE TIMES DAILY 90 capsule 3  . insulin aspart (NOVOLOG) 100 UNIT/ML injection Sliding scale 3 times daily: For blood sugar 70-120 do not give insulin. Blood sugar 121-150 give 2 units. Blood sugar 151-200 give 3 units. Blood sugar 201-250 give 6 units. Blood sugar 251-300 give 8 units. Blood sugar 301-350 give  10 units. Blood sugar 351-400 give 12 units. Blood sugar greater than 400 give 14 units and call your doctor. 10 mL 11  . insulin glargine (LANTUS) 100 UNIT/ML injection Inject 0.4 mLs (40 Units total) into the skin at bedtime. 10 mL 11  . Liniments (BLUE-EMU SUPER STRENGTH) CREA Apply topically.    Marland Kitchen. lisinopril (PRINIVIL,ZESTRIL) 5 MG tablet Take 5 mg by mouth daily.    . magnesium oxide (MAG-OX) 400 (241.3 Mg) MG tablet Take 1 tablet (400 mg total) by mouth daily.    . metFORMIN (GLUCOPHAGE) 500 MG tablet Take 250 mg by mouth 2 (two) times daily with a meal.    . oxyCODONE-acetaminophen (PERCOCET/ROXICET) 5-325 MG tablet Take 1-2  tablets by mouth every 6 (six) hours as needed. 20 tablet 0  . pantoprazole (PROTONIX) 40 MG tablet Take 40 mg by mouth daily.    . potassium chloride SA (K-DUR,KLOR-CON) 10 MEQ tablet Take 1 tablet (10 mEq total) by mouth daily. 30 tablet 1  . tamsulosin (FLOMAX) 0.4 MG CAPS capsule Take 1 capsule (0.4 mg total) by mouth daily. 30 capsule 0   No current facility-administered medications for this visit.      Past Surgical History:  Procedure Laterality Date  . BACK SURGERY  2015   lumbar fusion  . CORONARY ANGIOPLASTY    . CORONARY STENT PLACEMENT    . ENDARTERECTOMY Left 06/17/2015   Procedure: Left Carotid ENDARTERECTOMY with Livia SnellenXenosure Patch;  Surgeon: Chuck Hinthristopher S Dickson, MD;  [facility];  Service: Vascular;  Laterality: Left;  . EP IMPLANTABLE DEVICE N/A 05/26/2015   Procedure: Loop Recorder Insertion;  Surgeon: Marinus MawGregg W Taylor, MD;  Location: [facility] INVASIVE CV LAB;  Service: Cardiovascular;  Laterality: N/A;  . 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 JOINT REPLACEMENT Left   . KNEE SURGERY    . LUMBAR LAMINECTOMY/DECOMPRESSION MICRODISCECTOMY N/A 10/18/2014   Procedure: LUMBAR LAMINECTOMY/DECOMPRESSION MICRODISCECTOMYLUMBAR THREE-FOUR ;  Surgeon: Temple PaciniHenry A Pool, MD;  Location: [facility] NEURO ORS;  Service: Neurosurgery;  Laterality: N/A;  . TEE WITHOUT CARDIOVERSION N/A 05/26/2015   Procedure: TRANSESOPHAGEAL ECHOCARDIOGRAM (TEE);  Surgeon: Vesta MixerPhilip J Nahser, MD;  [facility];  Service: Cardiovascular;  Laterality: N/A;     Allergies  Allergen Reactions  . Trazodone And Nefazodone Other (See Comments)    High blood sugar      Family History  Problem Relation Age of Onset  . Heart attack Brother     x2 brothers  . CAD Brother   . Stroke Mother      Social History [name] reports that he has never smoked. He has never used smokeless tobacco. [name] reports that he does not drink alcohol.   Review of Systems CONSTITUTIONAL: No  weight loss, fever, chills, weakness or fatigue.  HEENT: Eyes: No visual loss, blurred vision, double vision or yellow sclerae.No hearing loss, sneezing, congestion, runny nose or sore throat.  SKIN: No rash or itching.  CARDIOVASCULAR:  RESPIRATORY: No shortness of breath, cough or sputum.  GASTROINTESTINAL: No anorexia, nausea, vomiting or diarrhea. No abdominal pain or blood.  GENITOURINARY: No burning on urination, no polyuria NEUROLOGICAL: No headache, dizziness, syncope, paralysis, ataxia, numbness or tingling in the extremities. No change in bowel or bladder control.  MUSCULOSKELETAL: No muscle, back pain, joint pain or stiffness.  LYMPHATICS: No enlarged nodes. No history of splenectomy.  PSYCHIATRIC: No history of depression or anxiety.  ENDOCRINOLOGIC: No reports of sweating, cold  or heat intolerance. No polyuria or polydipsia.  Marland Kitchen   Physical Examination There were no vitals filed for this visit. There were no vitals filed for this visit.  Gen: resting comfortably, no acute distress HEENT: no scleral icterus, pupils equal round and reactive, no palptable cervical adenopathy,  CV Resp: Clear to auscultation bilaterally GI: abdomen is soft, non-tender, non-distended, normal bowel sounds, no hepatosplenomegaly MSK: extremities are warm, no edema.  Skin: warm, no rash Neuro:  no focal deficits Psych: appropriate affect   Diagnostic Studies  Cath 12/2009 PROCEDURAL FINDINGS: The right coronary artery is small and nondominant. There is no significant obstructive disease.  Left mainstem: The left main is widely patent. There is no significant stenosis. The left main divides into the LAD and left circumflex.  LAD: The LAD is patent throughout its course. There is a widely patent stent in the proximal LAD with no significant in-stent restenosis. There are luminal irregularities throughout the mid LAD without obstructive disease. The first diagonal is a large Stefannie Defeo  with severe ostial narrowing. There is TIMI III flow but the ostium of the diagonal appears hypodense with 95-99% stenosis.  Left circumflex: The circumflex is patent. The first OM Makaylen Thieme has a patent stent and it is a large Elizabelle Fite. The stent has no significant restenosis. The AV groove circumflex courses down and has minor luminal irregularities but no significant stenosis is noted. The left circumflex is dominant and supplies a small left PDA.  FINAL ASSESSMENT: 1. Severe ostial diagonal stenosis with successful cutting balloon  angioplasty. 2. Continued patency of the left anterior descending (coronary artery)  and obtuse marginal stents with otherwise nonobstructive coronary  artery disease.  10/2009 MPI IMPRESSION: Large perfusion defect identified at the lateral and inferior walls of the left ventricle with evidence of significant reperfusion at the lateral wall portion of the defect on resting exam. Mildly decreased left ventricle ejection fraction of 43%. Hypokinesia of the lateral and inferior walls of the left ventricle.  05/2014 Echo LVEF 50-55%, abnormal diastolic function.   02/2015 ABI Normal bilaterally  06/2015 echo Study Conclusions  - Left ventricle: The cavity size was normal. Wall thickness was normal. Systolic function was mildly to moderately reduced. The estimated ejection fraction was in the range of 40% to 45%. Diffuse hypokinesis. LV apical false tendon. Doppler parameters are consistent with abnormal left ventricular relaxation (grade 1 diastolic dysfunction). The E/e&' ratio is between 8-15, suggesting indeterminate LV filling pressure. - Aortic valve: Calcified with a mobile density that appears attached to the non-coronary cusp. There was no regurgitation. - Mitral valve: Sclerotic leaflet tips. Trivial MR. - Left atrium: The atrium was normal in size. - Right atrium: The atrium was mildly dilated. - Atrial  septum: Mobile IAS. PFO cannot be excluded. - Tricuspid valve: There was trivial regurgitation.  Impressions:  - Compared to a prior study in 05/2015, the EF has improved to 40-45%. There is calcification of the aortic valve and a possible small mobile density on the non-coronary cusp that could be a sounce of embolism.   05/2015 Carotid US Summary: Findings suggest 1-39% right internal carotid artery stenosis and 60-79% left internal carotid artery stenosis. The right vertebral artery exhibits an atypical antegrade waveform. which suggests distal stenosis likely. The left vertebral artery is patent with antegrade flow.  05/2015 TEE Study Conclusions  - Left ventricle: Systolic function was moderately to severely reduced. The estimated ejection fraction was in the range of 30% to 35%. - Aortic valve: No evidence  of vegetation. - Mitral valve: No evidence of vegetation. There was mild regurgitation. - Left atrium: No evidence of thrombus in the atrial cavity or appendage. - Atrial septum: No defect or patent foramen ovale was identified. - Tricuspid valve: No evidence of vegetation.  06/2016 echo Study Conclusions  - Left ventricle: The cavity size was normal. There was mild   concentric hypertrophy. Systolic function was moderately reduced.   The estimated ejection fraction was in the range of 35% to 40%.   Doppler parameters are consistent with abnormal left ventricular   relaxation (grade 1 diastolic dysfunction). Indeterminate filling   pressures. - Regional wall motion abnormality: Akinesis of the mid inferior   myocardium; severe hypokinesis of the basal inferolateral   myocardium. Diffuse hypokinesis also noted. - Aortic valve: Mildly to moderately calcified annulus. Mildly   thickened, mildly calcified leaflets. Sclerosis without stenosis. - Mitral valve: Calcified annulus. Mildly thickened leaflets .   There was mild regurgitation. - Left  atrium: The atrium was mildly to moderately dilated.  06/2016 Carotid US IMPRESSION: Moderate partially calcified plaque at the right carotid bifurcation again noted with estimated 50- 69% right ICA stenosis. No evidence of left carotid stenosis following prior endarterectomy. Assessment and Plan   1.CAD/ICM/Chronic systolic HF - medical therapy limited by low bp's -  no current symptoms - continue current meds  2. Hyperlipidemia - continue statin, we will request labs from pcp  3. AAA - stable by last [place], continue to monitor  4. CVA - secondary prevention per neuro - do not recommend repeating TEE, previous showed calcified lambl's excresence, would not change management  5. Carotid stenosis - continue to follow with vascular   F/u 3 months     Antoine Poche, M.D., F.A.C.C.

## 2016-09-03 LAB — CUP PACEART REMOTE DEVICE CHECK
Implantable Pulse Generator Implant Date: 20160912
MDC IDC SESS DTM: 20171106191206

## 2016-09-03 NOTE — Progress Notes (Signed)
Carelink summary report received. Battery status OK. Normal device function. No new symptom episodes, brady, or pause episodes. No new AF episodes. 1 tachy- ECG appears SR w/ BBB, TWOS. Monthly summary reports and ROV/PRN

## 2016-09-16 ENCOUNTER — Telehealth: Payer: Self-pay | Admitting: Cardiovascular Disease

## 2016-09-16 NOTE — Telephone Encounter (Signed)
Numerous attempts to contact patient with recall letters. Unable to reach by telephone. with no success.   Lucas Morgan, CMA [[id number]][[id number]] 01/23/2016 12:54 PM New [10]    [System] 02/12/2016 11:04 PM Notification Sent [20]   Lucas Morgan [[id number]][[id number]] 03/30/2016 9:21 AM Scheduled/Linked [30]   Lucas Morgan [[id number]][[id number]] 05/19/2016 3:05 PM Notification Sent [20]   Lucas Morgan [[id number]][[id number]] 05/19/2016 3:05 PM Scheduled/Linked [30]   [System] 08/22/2016 12:36 AM Notification Sent [20]   Lucas Morgan [[id number]][[id number]] 09/02/2016 8:11 AM Notification Sent [20]

## 2016-10-20 ENCOUNTER — Other Ambulatory Visit: Payer: Self-pay | Admitting: Internal Medicine

## 2017-06-23 ENCOUNTER — Encounter (HOSPITAL_COMMUNITY): Payer: Self-pay

## 2017-06-23 ENCOUNTER — Observation Stay (HOSPITAL_COMMUNITY)
Admission: EM | Admit: 2017-06-23 | Discharge: 2017-06-25 | Disposition: A | Discharge status: Home / Self Care | Payer: Medicare Other | Attending: Family Medicine | Admitting: Family Medicine

## 2017-06-23 ENCOUNTER — Emergency Department (HOSPITAL_COMMUNITY): Payer: Medicare Other

## 2017-06-23 DIAGNOSIS — I714 Abdominal aortic aneurysm, without rupture, unspecified: Secondary | ICD-10-CM | POA: Diagnosis present

## 2017-06-23 DIAGNOSIS — J849 Interstitial pulmonary disease, unspecified: Secondary | ICD-10-CM | POA: Diagnosis present

## 2017-06-23 DIAGNOSIS — I2510 Atherosclerotic heart disease of native coronary artery without angina pectoris: Secondary | ICD-10-CM | POA: Diagnosis present

## 2017-06-23 DIAGNOSIS — N3000 Acute cystitis without hematuria: Secondary | ICD-10-CM | POA: Diagnosis not present

## 2017-06-23 DIAGNOSIS — R7989 Other specified abnormal findings of blood chemistry: Secondary | ICD-10-CM | POA: Diagnosis present

## 2017-06-23 DIAGNOSIS — R52 Pain, unspecified: Secondary | ICD-10-CM | POA: Insufficient documentation

## 2017-06-23 DIAGNOSIS — Z794 Long term (current) use of insulin: Secondary | ICD-10-CM | POA: Diagnosis not present

## 2017-06-23 DIAGNOSIS — N182 Chronic kidney disease, stage 2 (mild): Secondary | ICD-10-CM | POA: Diagnosis not present

## 2017-06-23 DIAGNOSIS — R0602 Shortness of breath: Principal | ICD-10-CM | POA: Insufficient documentation

## 2017-06-23 DIAGNOSIS — G894 Chronic pain syndrome: Secondary | ICD-10-CM | POA: Diagnosis present

## 2017-06-23 DIAGNOSIS — I509 Heart failure, unspecified: Secondary | ICD-10-CM | POA: Diagnosis not present

## 2017-06-23 DIAGNOSIS — R778 Other specified abnormalities of plasma proteins: Secondary | ICD-10-CM | POA: Diagnosis present

## 2017-06-23 DIAGNOSIS — E1122 Type 2 diabetes mellitus with diabetic chronic kidney disease: Secondary | ICD-10-CM

## 2017-06-23 DIAGNOSIS — Z8673 Personal history of transient ischemic attack (TIA), and cerebral infarction without residual deficits: Secondary | ICD-10-CM | POA: Diagnosis not present

## 2017-06-23 DIAGNOSIS — N390 Urinary tract infection, site not specified: Secondary | ICD-10-CM | POA: Diagnosis present

## 2017-06-23 DIAGNOSIS — R509 Fever, unspecified: Secondary | ICD-10-CM | POA: Insufficient documentation

## 2017-06-23 LAB — CBC WITH DIFFERENTIAL/PLATELET
Basophils Absolute: 0 10*3/uL (ref 0.0–0.1)
Basophils Relative: 0 %
EOS PCT: 6 %
Eosinophils Absolute: 0.6 10*3/uL (ref 0.0–0.7)
HCT: 40.2 % (ref 39.0–52.0)
HEMOGLOBIN: 13.6 g/dL (ref 13.0–17.0)
LYMPHS PCT: 19 %
Lymphs Abs: 1.8 10*3/uL (ref 0.7–4.0)
MCH: 31.3 pg (ref 26.0–34.0)
MCHC: 33.8 g/dL (ref 30.0–36.0)
MCV: 92.6 fL (ref 78.0–100.0)
Monocytes Absolute: 1 10*3/uL (ref 0.1–1.0)
Monocytes Relative: 11 %
NEUTROS PCT: 64 %
Neutro Abs: 6 10*3/uL (ref 1.7–7.7)
PLATELETS: 149 10*3/uL — AB (ref 150–400)
RBC: 4.34 MIL/uL (ref 4.22–5.81)
RDW: 13 % (ref 11.5–15.5)
WBC: 9.4 10*3/uL (ref 4.0–10.5)

## 2017-06-23 LAB — URINALYSIS, ROUTINE W REFLEX MICROSCOPIC
BILIRUBIN URINE: NEGATIVE
Glucose, UA: 50 mg/dL — AB
HGB URINE DIPSTICK: NEGATIVE
Ketones, ur: NEGATIVE mg/dL
NITRITE: POSITIVE — AB
PH: 6 (ref 5.0–8.0)
Protein, ur: NEGATIVE mg/dL
SPECIFIC GRAVITY, URINE: 1.01 (ref 1.005–1.030)
Squamous Epithelial / LPF: NONE SEEN

## 2017-06-23 LAB — COMPREHENSIVE METABOLIC PANEL
ALT: 12 U/L — ABNORMAL LOW (ref 17–63)
ANION GAP: 11 (ref 5–15)
AST: 18 U/L (ref 15–41)
Albumin: 3.3 g/dL — ABNORMAL LOW (ref 3.5–5.0)
Alkaline Phosphatase: 81 U/L (ref 38–126)
BILIRUBIN TOTAL: 0.5 mg/dL (ref 0.3–1.2)
BUN: 13 mg/dL (ref 6–20)
CO2: 25 mmol/L (ref 22–32)
Calcium: 8.6 mg/dL — ABNORMAL LOW (ref 8.9–10.3)
Chloride: 103 mmol/L (ref 101–111)
Creatinine, Ser: 1.18 mg/dL (ref 0.61–1.24)
GFR calc Af Amer: >60 mL/min (ref >60)
GFR, EST NON AFRICAN AMERICAN: 59 mL/min — AB (ref >60)
Glucose, Bld: 206 mg/dL — ABNORMAL HIGH (ref 65–99)
POTASSIUM: 3.8 mmol/L (ref 3.5–5.1)
Sodium: 139 mmol/L (ref 135–145)
TOTAL PROTEIN: 6.9 g/dL (ref 6.5–8.1)

## 2017-06-23 LAB — TROPONIN I: TROPONIN I: 0.03 ng/mL — AB (ref <0.03)

## 2017-06-23 MED ORDER — OXYCODONE-ACETAMINOPHEN 5-325 MG PO TABS
1.0000 | ORAL_TABLET | Freq: Once | ORAL | Status: AC
  Administered 2017-06-23: 1 via ORAL
  Filled 2017-06-23: qty 1

## 2017-06-23 MED ORDER — SODIUM CHLORIDE 0.9 % IV BOLUS (SEPSIS)
500.0000 mL | Freq: Once | INTRAVENOUS | Status: AC
  Administered 2017-06-23: 500 mL via INTRAVENOUS

## 2017-06-23 MED ORDER — DEXTROSE 5 % IV SOLN
1.0000 g | Freq: Once | INTRAVENOUS | Status: AC
  Administered 2017-06-23: 1 g via INTRAVENOUS
  Filled 2017-06-23: qty 10

## 2017-06-23 NOTE — ED Provider Notes (Signed)
Emergency Department Provider Note   I have reviewed the triage vital signs and the nursing notes.   HISTORY  Chief Complaint Shortness of Breath   HPI Lucas Morgan is a 76 y.o. male with PMH of AAA, CHF, interstitial lung disease, CAD, DM, HLD, and CAD presents to the emergency department for evaluation of fever at home with shortness of breath and fatigue. Patient had recent hospitalization for rib fracture on the right after fall. Afterwards he is discharged to a rehabilitation facility and recently returned home. Over the past several days he's been intermittently complaining of shortness of breath and yesterday developed fever according to family member at bedside. Shortness of breath worsened today and a home health nurse appreciated some abnormal lung sounds on exam. The family member returned home from work patient was continued to complain of shortness of breath. They called their primary care provider who referred them to the emergency department for evaluation and rule out of pneumonia. Of note, patient does have an indwelling Foley catheter which has been in place for the past 5 months because of urinary retention. Was last changed during his recent hospitalization. Family member also states that the patient has been complaining about acute on chronic back pain. Denies any weakness or numbness.   Patient has no complaints at this time but his history in particular is limited by confusion which is at baseline for him.   Level 5 caveat: Baseline confusion.   Past Medical History:  Diagnosis Date  . AAA (abdominal aortic aneurysm) (HCC)    a. Abd U/S 7/14: mild aneurysmal dilatation 3x3 cm; cholelithiasis without evid of cholecystitis => repeat 1 year  . Arthritis    stenosis, lumbar region  . CHF (congestive heart failure) (HCC)   . Chronic interstitial lung disease (HCC) 06/24/2016  . Coronary artery disease   . Diabetes mellitus   . Dyslipidemia   . MI (myocardial  infarction) (HCC) 11/09/2008   2.5 x 23 Xience V DES to the CFX  . Osteoporosis   . Renal insufficiency   . Stroke [facility])     Patient Active Problem List   Diagnosis Date Noted  . Urinary retention 06/25/2016  . Chronic interstitial lung disease (HCC) 06/24/2016  . Acute encephalopathy 06/23/2016  . Hypokalemia 06/23/2016  . AKI (acute kidney injury) (HCC)   . Altered mental status 06/21/2016  . Hypomagnesemia 06/21/2016  . Elevated troponin 06/21/2016  . Left leg weakness   . Acute CVA (cerebrovascular accident) (HCC) 01/14/2016  . Fall at home 01/13/2016  . Ataxia 01/13/2016  . Orthostatic hypotension 01/13/2016  . Hip pain, acute   . 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 Tremor of both hands 11/18/2015  . HLD (hyperlipidemia) 11/18/2015  . Type 2 diabetes mellitus with stage 2 chronic kidney disease, with long-term current use of insulin (HCC) 08/15/2015  . S/P carotid endarterectomy 08/15/2015  . Acute on chronic systolic congestive heart failure (HCC) 08/15/2015  . Cardiomyopathy, ischemic 06/18/2015  . Cerebral infarction due to unspecified mechanism   . Chronic pain syndrome 06/11/2015  . Adjustment disorder with anxious mood 06/11/2015  . Aphasia S/P CVA 05/28/2015  . Right hemiparesis (HCC) 05/28/2015  . Stroke, acute, embolic (HCC) 05/27/2015  . Carotid stenosis   . Acute on chronic renal failure (HCC) 05/24/2015  . CVA (cerebral infarction) 05/24/2015  . Leukocytosis 05/24/2015  . Foreign body in colon 04/08/2015  . Spinal stenosis of lumbar region 10/18/2014  .  Lumbar stenosis with neurogenic claudication 10/18/2014  . Thyroid nodule 03/12/2013  . AAA (abdominal aortic aneurysm) (HCC) 03/12/2013  . PULMONARY NODULE 08/14/2010  . COUGH 06/30/2010  . HYPOTENSION 05/04/2010  . AODM 01/27/2009  . CAD, NATIVE VESSEL 12/26/2008    Past Surgical History:  Procedure Laterality Date  . BACK SURGERY  2015   lumbar fusion   . CORONARY ANGIOPLASTY    . CORONARY STENT PLACEMENT    . ENDARTERECTOMY Left 06/17/2015   Procedure: Left Carotid ENDARTERECTOMY with Livia Snellen Patch;  Surgeon: Chuck Hint, MD;  Location: [facility] OR;  Service: Vascular;  Laterality: Left;  . EP IMPLANTABLE DEVICE N/A 05/26/2015   Procedure: Loop Recorder Insertion;  Surgeon: Marinus Maw, MD;  Location: [facility] INVASIVE CV LAB;  Service: Cardiovascular;  Laterality: N/A;  . 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 JOINT REPLACEMENT Left   . KNEE SURGERY    . LUMBAR LAMINECTOMY/DECOMPRESSION MICRODISCECTOMY N/A 10/18/2014   Procedure: LUMBAR LAMINECTOMY/DECOMPRESSION MICRODISCECTOMYLUMBAR THREE-FOUR ;  Surgeon: Temple Pacini, MD;  Location: [facility] NEURO ORS;  Service: Neurosurgery;  Laterality: N/A;  . TEE WITHOUT CARDIOVERSION N/A 05/26/2015   Procedure: TRANSESOPHAGEAL ECHOCARDIOGRAM (TEE);  Surgeon: Vesta Mixer, MD;  Location[facility] ENDOSCOPY;  Service: Cardiovascular;  Laterality: N/A;    Current Outpatient Rx  . Order #: [id number] Class: Historical Med  . Order #: [id number] Class: Historical Med  . Order #: [id number] Class: No Print  . Order #: [id number] Class: Normal  . Order #: [id number] Class: Historical Med  . Order #: [id number] Class: Historical Med  . Order #: [id number] Class: Print  . Order #: [id number] Class: Historical Med  . Order #: [id number] Class: Historical Med  . Order #: [id number] Class: Normal  . Order #: [id number] Class: Historical Med  . Order #: [id number] Class: No Print  . Order #: [id number] Class: Historical Med  . Order #: [id number] Class: Historical Med  . Order #: [id number] Class: Historical Med  . Order #: [id number] Class: Historical Med  . Order #: [id number] Class: Historical Med    Allergies Trazodone and nefazodone  Family History  Problem Relation Age of Onset  . Heart attack Brother        x2 brothers  . CAD Brother   . Stroke Mother     Social History Social History  Substance  Use Topics  . Smoking status: Never Smoker  . Smokeless tobacco: Never Used  . Alcohol use No    Review of Systems  Constitutional: Positive fever/chills Eyes: No visual changes. ENT: No sore throat. Cardiovascular: Denies chest pain. Respiratory: Positive shortness of breath. Gastrointestinal: No abdominal pain.  No nausea, no vomiting.  No diarrhea.  No constipation. Genitourinary: Negative for dysuria. Musculoskeletal: Positive for back pain. Skin: Negative for rash. Neurological: Negative for headaches, focal weakness or numbness.  10-point ROS otherwise negative.  ____________________________________________   PHYSICAL EXAM:  VITAL SIGNS: ED Triage Vitals  Enc Vitals Group     BP 06/23/17 2206 134/75     Pulse Rate 06/23/17 2206 90     Resp 06/23/17 2206 (!) 22     Temp 06/23/17 2206 98.2 F (36.8 C)     Temp Source 06/23/17 2206 Oral     SpO2 06/23/17 2206 97 %     Weight 06/23/17 2208 152 lb (68.9 kg)     Height 06/23/17 2208  (1.753 m)     Pain Score 06/23/17 2220 7   Constitutional: Alert with some confusion. Well appearing and in no  acute distress. Eyes: Conjunctivae are normal.  Head: Atraumatic. Nose: No congestion/rhinnorhea. Mouth/Throat: Mucous membranes are moist. Neck: No stridor. Cardiovascular: Normal rate, regular rhythm. Good peripheral circulation. Grossly normal heart sounds.   Respiratory: Normal respiratory effort.  No retractions. Lungs CTAB. Gastrointestinal: Soft and nontender. Musculoskeletal: No lower extremity tenderness nor edema. No gross deformities of extremities. Neurologic:  Normal speech and language. No gross focal neurologic deficits are appreciated.  Skin:  Skin is warm, dry and intact. No rash noted.  ____________________________________________   LABS (all labs ordered are listed, but only abnormal results are displayed)  Labs Reviewed  COMPREHENSIVE METABOLIC PANEL - Abnormal; Notable for the following:        Result Value   Glucose, Bld 206 (*)    Calcium 8.6 (*)    Albumin 3.3 (*)    ALT 12 (*)    GFR calc non Af Amer 59 (*)    All other components within normal limits  CBC WITH DIFFERENTIAL/PLATELET - Abnormal; Notable for the following:    Platelets 149 (*)    All other components within normal limits  TROPONIN I - Abnormal; Notable for the following:    Troponin I 0.03 (*)    All other components within normal limits  URINALYSIS, ROUTINE W REFLEX MICROSCOPIC - Abnormal; Notable for the following:    Color, Urine STRAW (*)    APPearance HAZY (*)    Glucose, UA 50 (*)    Nitrite POSITIVE (*)    Leukocytes, UA LARGE (*)    Bacteria, UA RARE (*)    All other components within normal limits  URINE CULTURE   ____________________________________________  EKG   EKG Interpretation  Date/Time:  Thursday June 23 2017 22:04:43 EDT Ventricular Rate:  90 PR Interval:    QRS Duration: 140 QT Interval:  408 QTC Calculation: 500 R Axis:   -131 Text Interpretation:  Sinus rhythm IVCD, consider atypical RBBB No STEMI.  Confirmed by Alona Bene [phone]) on 06/23/2017 10:42:44 PM       ____________________________________________  RADIOLOGY  Dg Chest 2 View  Result Date: 06/23/2017 CLINICAL DATA:  Dyspnea and productive cough.  Fever for 2 days. EXAM: CHEST  2 VIEW COMPARISON:  06/23/2016 FINDINGS: Diffuse interstitial coarsening, unchanged and likely fibrotic. No consolidation. No effusions. Hilar, mediastinal and cardiac contours are unremarkable and unchanged. IMPRESSION: Diffuse chronic appearing interstitial coarsening. No consolidation or effusion. Electronically Signed   By: Ellery Plunk M.D.   On: 06/23/2017 23:10    ____________________________________________   PROCEDURES  Procedure(s) performed:   Procedures  None ____________________________________________   INITIAL IMPRESSION / ASSESSMENT AND PLAN / ED COURSE  Pertinent labs & imaging results that  were available during my care of the patient were reviewed by me and considered in my medical decision making (see chart for details).  Patient resents to the emergency department with fever, shortness of breath, generalized weakness in the setting of recent rib fractures requiring hospitalization and rehabilitation stay. Patient also with indwelling Foley catheter. Well appearing and in no acute distress. No hypoxemia. Plan for labs, CXR, and reassess.   Patient with UTI on UA with catheter just changed last week. No PNA. No O2 requirement. Patient with worsening weakness and confusion per family. Plan for admission for UTI treatment, IV, and monitoring for improvement of fever and encephalopathy.   Discussed patient's case with Hospitalist, Dr. Antionette Char to request admission. Patient and family (if present) updated with plan. Care transferred to Hospitalist service.  I reviewed all  nursing notes, vitals, pertinent old records, EKGs, labs, imaging (as available).  ____________________________________________  FINAL CLINICAL IMPRESSION(S) / ED DIAGNOSES  Final diagnoses:  Acute cystitis without hematuria  SOB (shortness of breath)  Fever, unspecified fever cause     MEDICATIONS GIVEN DURING THIS VISIT:  Medications  cefTRIAXone (ROCEPHIN) 1 g in dextrose 5 % 50 mL IVPB (not administered)  sodium chloride 0.9 % bolus 500 mL (500 mLs Intravenous New Bag/Given 06/23/17 2254)  oxyCODONE-acetaminophen (PERCOCET/ROXICET) 5-325 MG per tablet 1 tablet (1 tablet Oral Given 06/23/17 2254)     NEW OUTPATIENT MEDICATIONS STARTED DURING THIS VISIT:  None  Note:  This document was prepared using Dragon voice recognition software and may include unintentional dictation errors.  Alona Bene, MD Emergency Medicine    Long, Arlyss Repress, MD 06/23/17 2322

## 2017-06-23 NOTE — ED Triage Notes (Signed)
Pt is from home, arrives via [facility] for c/o sob.  Pt denies complaints at this time and appears comfortable.

## 2017-06-24 ENCOUNTER — Encounter (HOSPITAL_COMMUNITY): Payer: Self-pay | Admitting: Family Medicine

## 2017-06-24 ENCOUNTER — Observation Stay (HOSPITAL_COMMUNITY): Payer: Medicare Other

## 2017-06-24 DIAGNOSIS — Z8673 Personal history of transient ischemic attack (TIA), and cerebral infarction without residual deficits: Secondary | ICD-10-CM | POA: Diagnosis not present

## 2017-06-24 DIAGNOSIS — N182 Chronic kidney disease, stage 2 (mild): Secondary | ICD-10-CM | POA: Diagnosis not present

## 2017-06-24 DIAGNOSIS — N390 Urinary tract infection, site not specified: Secondary | ICD-10-CM

## 2017-06-24 DIAGNOSIS — J849 Interstitial pulmonary disease, unspecified: Secondary | ICD-10-CM | POA: Diagnosis not present

## 2017-06-24 DIAGNOSIS — E1122 Type 2 diabetes mellitus with diabetic chronic kidney disease: Secondary | ICD-10-CM

## 2017-06-24 DIAGNOSIS — I714 Abdominal aortic aneurysm, without rupture: Secondary | ICD-10-CM

## 2017-06-24 DIAGNOSIS — R748 Abnormal levels of other serum enzymes: Secondary | ICD-10-CM

## 2017-06-24 DIAGNOSIS — Z794 Long term (current) use of insulin: Secondary | ICD-10-CM

## 2017-06-24 DIAGNOSIS — G894 Chronic pain syndrome: Secondary | ICD-10-CM | POA: Diagnosis not present

## 2017-06-24 LAB — CBC WITH DIFFERENTIAL/PLATELET
BASOS ABS: 0.1 10*3/uL (ref 0.0–0.1)
BASOS PCT: 0 %
EOS ABS: 0.8 10*3/uL — AB (ref 0.0–0.7)
Eosinophils Relative: 7 %
HEMATOCRIT: 40 % (ref 39.0–52.0)
HEMOGLOBIN: 13.4 g/dL (ref 13.0–17.0)
Lymphocytes Relative: 19 %
Lymphs Abs: 2.2 10*3/uL (ref 0.7–4.0)
MCH: 31.3 pg (ref 26.0–34.0)
MCHC: 33.5 g/dL (ref 30.0–36.0)
MCV: 93.5 fL (ref 78.0–100.0)
Monocytes Absolute: 1 10*3/uL (ref 0.1–1.0)
Monocytes Relative: 9 %
NEUTROS PCT: 65 %
Neutro Abs: 7.3 10*3/uL (ref 1.7–7.7)
Platelets: 155 10*3/uL (ref 150–400)
RBC: 4.28 MIL/uL (ref 4.22–5.81)
RDW: 13.2 % (ref 11.5–15.5)
WBC: 11.3 10*3/uL — AB (ref 4.0–10.5)

## 2017-06-24 LAB — GLUCOSE, CAPILLARY
GLUCOSE-CAPILLARY: 254 mg/dL — AB (ref 65–99)
Glucose-Capillary: 219 mg/dL — ABNORMAL HIGH (ref 65–99)

## 2017-06-24 LAB — TROPONIN I

## 2017-06-24 LAB — CBG MONITORING, ED
GLUCOSE-CAPILLARY: 186 mg/dL — AB (ref 65–99)
Glucose-Capillary: 226 mg/dL — ABNORMAL HIGH (ref 65–99)

## 2017-06-24 MED ORDER — CITALOPRAM HYDROBROMIDE 20 MG PO TABS
20.0000 mg | ORAL_TABLET | Freq: Every day | ORAL | Status: DC
  Administered 2017-06-24 – 2017-06-25 (×2): 20 mg via ORAL
  Filled 2017-06-24 (×5): qty 1

## 2017-06-24 MED ORDER — INSULIN ASPART 100 UNIT/ML ~~LOC~~ SOLN
0.0000 [IU] | Freq: Three times a day (TID) | SUBCUTANEOUS | Status: DC
  Administered 2017-06-24: 2 [IU] via SUBCUTANEOUS
  Administered 2017-06-24 (×2): 3 [IU] via SUBCUTANEOUS
  Administered 2017-06-25: 2 [IU] via SUBCUTANEOUS
  Filled 2017-06-24 (×2): qty 1

## 2017-06-24 MED ORDER — CLOPIDOGREL BISULFATE 75 MG PO TABS
75.0000 mg | ORAL_TABLET | Freq: Every day | ORAL | Status: DC
  Administered 2017-06-24 – 2017-06-25 (×2): 75 mg via ORAL
  Filled 2017-06-24 (×2): qty 1

## 2017-06-24 MED ORDER — ONDANSETRON HCL 4 MG PO TABS: 4.0000 mg | ORAL_TABLET | Freq: Four times a day (QID) | ORAL | Status: DC | PRN

## 2017-06-24 MED ORDER — ALBUTEROL SULFATE (2.5 MG/3ML) 0.083% IN NEBU
2.5000 mg | INHALATION_SOLUTION | RESPIRATORY_TRACT | Status: DC | PRN
  Filled 2017-06-24: qty 3

## 2017-06-24 MED ORDER — DOCUSATE SODIUM 100 MG PO CAPS
100.0000 mg | ORAL_CAPSULE | Freq: Two times a day (BID) | ORAL | Status: DC
Start: 2017-06-24 — End: 2017-06-25
  Administered 2017-06-24 – 2017-06-25 (×3): 100 mg via ORAL
  Filled 2017-06-24 (×3): qty 1

## 2017-06-24 MED ORDER — ALBUTEROL SULFATE (2.5 MG/3ML) 0.083% IN NEBU
2.5000 mg | INHALATION_SOLUTION | RESPIRATORY_TRACT | Status: DC | PRN
  Administered 2017-06-24: 2.5 mg via RESPIRATORY_TRACT

## 2017-06-24 MED ORDER — ASPIRIN EC 81 MG PO TBEC
81.0000 mg | DELAYED_RELEASE_TABLET | Freq: Every day | ORAL | Status: DC
  Administered 2017-06-24 – 2017-06-25 (×2): 81 mg via ORAL
  Filled 2017-06-24 (×2): qty 1

## 2017-06-24 MED ORDER — ENOXAPARIN SODIUM 40 MG/0.4ML ~~LOC~~ SOLN
40.0000 mg | SUBCUTANEOUS | Status: DC
  Administered 2017-06-24 – 2017-06-25 (×2): 40 mg via SUBCUTANEOUS
  Filled 2017-06-24 (×2): qty 0.4

## 2017-06-24 MED ORDER — OXYCODONE HCL 5 MG PO TABS
10.0000 mg | ORAL_TABLET | ORAL | Status: DC | PRN
Start: 2017-06-23 — End: 2017-06-25
  Administered 2017-06-24 – 2017-06-25 (×6): 10 mg via ORAL
  Filled 2017-06-24 (×6): qty 2

## 2017-06-24 MED ORDER — INSULIN ASPART 100 UNIT/ML ~~LOC~~ SOLN
0.0000 [IU] | Freq: Every day | SUBCUTANEOUS | Status: DC
  Administered 2017-06-24: 3 [IU] via SUBCUTANEOUS

## 2017-06-24 MED ORDER — SODIUM CHLORIDE 0.9% FLUSH
3.0000 mL | Freq: Two times a day (BID) | INTRAVENOUS | Status: DC
  Administered 2017-06-24 – 2017-06-25 (×2): 3 mL via INTRAVENOUS

## 2017-06-24 MED ORDER — ATORVASTATIN CALCIUM 40 MG PO TABS
80.0000 mg | ORAL_TABLET | Freq: Every day | ORAL | Status: DC
  Administered 2017-06-24: 80 mg via ORAL
  Filled 2017-06-24: qty 2

## 2017-06-24 MED ORDER — ACETAMINOPHEN 650 MG RE SUPP: 650.0000 mg | Freq: Four times a day (QID) | RECTAL | Status: DC | PRN

## 2017-06-24 MED ORDER — PANTOPRAZOLE SODIUM 40 MG PO TBEC
40.0000 mg | DELAYED_RELEASE_TABLET | Freq: Every day | ORAL | Status: DC
  Administered 2017-06-24 – 2017-06-25 (×2): 40 mg via ORAL
  Filled 2017-06-24 (×2): qty 1

## 2017-06-24 MED ORDER — ACETAMINOPHEN 325 MG PO TABS: 650.0000 mg | ORAL_TABLET | Freq: Four times a day (QID) | ORAL | Status: DC | PRN

## 2017-06-24 MED ORDER — SODIUM CHLORIDE 0.9 % IV SOLN: 250.0000 mL | INTRAVENOUS | Status: DC | PRN

## 2017-06-24 MED ORDER — GABAPENTIN 100 MG PO CAPS
100.0000 mg | ORAL_CAPSULE | Freq: Three times a day (TID) | ORAL | Status: DC
  Administered 2017-06-24 – 2017-06-25 (×3): 100 mg via ORAL
  Filled 2017-06-24 (×4): qty 1

## 2017-06-24 MED ORDER — PRIMIDONE 50 MG PO TABS
50.0000 mg | ORAL_TABLET | Freq: Every day | ORAL | Status: DC
  Administered 2017-06-24: 50 mg via ORAL
  Filled 2017-06-24 (×5): qty 1

## 2017-06-24 MED ORDER — INSULIN DETEMIR 100 UNIT/ML ~~LOC~~ SOLN
20.0000 [IU] | Freq: Every day | SUBCUTANEOUS | Status: DC
  Administered 2017-06-24: 20 [IU] via SUBCUTANEOUS
  Filled 2017-06-24 (×4): qty 0.2

## 2017-06-24 MED ORDER — SODIUM CHLORIDE 0.9 % IV SOLN
INTRAVENOUS | Status: AC
  Administered 2017-06-24 – 2017-06-25 (×3): via INTRAVENOUS

## 2017-06-24 MED ORDER — ONDANSETRON HCL 4 MG/2ML IJ SOLN: 4.0000 mg | Freq: Four times a day (QID) | INTRAMUSCULAR | Status: DC | PRN

## 2017-06-24 MED ORDER — ALPRAZOLAM 0.25 MG PO TABS
0.2500 mg | ORAL_TABLET | Freq: Every day | ORAL | Status: DC
  Administered 2017-06-24: 0.25 mg via ORAL
  Filled 2017-06-24: qty 1

## 2017-06-24 MED ORDER — BACLOFEN 10 MG PO TABS
20.0000 mg | ORAL_TABLET | Freq: Every day | ORAL | Status: DC
  Administered 2017-06-24: 20 mg via ORAL
  Filled 2017-06-24: qty 2

## 2017-06-24 MED ORDER — MAGNESIUM OXIDE 400 (241.3 MG) MG PO TABS
400.0000 mg | ORAL_TABLET | Freq: Every day | ORAL | Status: DC
  Administered 2017-06-24 – 2017-06-25 (×2): 400 mg via ORAL
  Filled 2017-06-24 (×2): qty 1

## 2017-06-24 MED ORDER — KETOTIFEN FUMARATE 0.025 % OP SOLN
2.0000 [drp] | Freq: Two times a day (BID) | OPHTHALMIC | Status: DC
  Administered 2017-06-24: 2 [drp] via OPHTHALMIC
  Filled 2017-06-24: qty 5

## 2017-06-24 MED ORDER — SODIUM CHLORIDE 0.9% FLUSH: 3.0000 mL | INTRAVENOUS | Status: DC | PRN

## 2017-06-24 MED ORDER — DEXTROSE 5 % IV SOLN
1.0000 g | INTRAVENOUS | Status: DC
  Administered 2017-06-24: 1 g via INTRAVENOUS
  Filled 2017-06-24 (×3): qty 10

## 2017-06-24 MED ORDER — SENNOSIDES-DOCUSATE SODIUM 8.6-50 MG PO TABS
2.0000 | ORAL_TABLET | Freq: Every day | ORAL | Status: DC
  Administered 2017-06-24 – 2017-06-25 (×2): 2 via ORAL
  Filled 2017-06-24 (×5): qty 2

## 2017-06-24 NOTE — Evaluation (Signed)
Physical Therapy Evaluation Patient Details Name: Lucas Morgan MRN: [id number] DOB: 1940-10-14 Today's Date: 06/24/2017   History of Present Illness  Lucas Morgan is a 76 y.o. male with medical history significant for coronary artery disease with stents, history of CVA, chronic interstitial lung disease, insulin-dependent diabetes mellitus, and asymptomatic AAA, now presenting to the emergency department for evaluation of lethargy and dyspnea. Patient is brought in by his family after noting him to be increasingly lethargic over the past couple days and complaining of some dyspnea today. Patient reports some mild dyspnea upon arrival, but denies any other complaints. He has been sleeping more than usual, but family is not noted any labored breathing or significant cough. He reports that he had a fever earlier today and they treated him with Tylenol. No other interventions attempted prior to coming in.    Clinical Impression  Patient limited for functional mobility as stated below secondary to BLE weakness, fatigue and poor standing balance.  Patient will benefit from continued physical therapy in hospital and recommended venue below to increase strength, balance, endurance for safe ADLs and gait.    Follow Up Recommendations SNF;Supervision/Assistance - 24 hour    Equipment Recommendations  None recommended by PT    Recommendations for Other Services       Precautions / Restrictions Precautions Precautions: Fall Restrictions Weight Bearing Restrictions: No      Mobility  Bed Mobility Overal bed mobility: Needs Assistance Bed Mobility: Supine to Sit;Sit to Supine     Supine to sit: Min guard Sit to supine: Min guard      Transfers Overall transfer level: Needs assistance Equipment used: Rolling walker (2 wheeled) Transfers: Sit to/from Stand Sit to Stand: Min assist            Ambulation/Gait Ambulation/Gait assistance: Architect (Feet): 15  Feet Assistive device: Rolling walker (2 wheeled) Gait Pattern/deviations: Decreased step length - right;Decreased step length - left;Decreased stride length Gait velocity: slow Gait velocity interpretation: Below normal speed for age/gender General Gait Details: demonstrates slow labored cadence, limited due to c/o fatigue  Stairs            Wheelchair Mobility    Modified Rankin (Stroke Patients Only)       Balance Overall balance assessment: Needs assistance Sitting-balance support: Bilateral upper extremity supported;Feet supported Sitting balance-Leahy Scale: Fair     Standing balance support: Bilateral upper extremity supported;During functional activity Standing balance-Leahy Scale: Poor                               Pertinent Vitals/Pain Pain Assessment: 0-10 Pain Score: 10-Worst pain ever Pain Location: right shoulder Pain Descriptors / Indicators: Constant;Sharp Pain Intervention(s): Limited activity within patient's tolerance;Monitored during session    Home Living Family/patient expects to be discharged to:: Private residence Living Arrangements: Children   Type of Home: House       Home Layout: One level Home Equipment: Environmental consultant - 2 wheels;Bedside commode;Shower seat;Wheelchair - manual;Hospital bed      Prior Function Level of Independence: Independent with assistive device(s)               Hand Dominance        Extremity/Trunk Assessment   Upper Extremity Assessment Upper Extremity Assessment: Overall WFL for tasks assessed;RUE deficits/detail RUE Deficits / Details: grossly 3/5    Lower Extremity Assessment Lower Extremity Assessment: Generalized weakness  Communication   Communication: Expressive difficulties  Cognition Arousal/Alertness: Awake/alert Behavior During Therapy: WFL for tasks assessed/performed Overall Cognitive Status: Within Functional Limits for tasks assessed                                         General Comments      Exercises     Assessment/Plan    PT Assessment Patient needs continued PT services  PT Problem List Decreased strength;Decreased activity tolerance;Decreased balance;Decreased mobility;Pain       PT Treatment Interventions Gait training;Functional mobility training;Therapeutic activities;Therapeutic exercise;Patient/family education    PT Goals (Current goals can be found in the Care Plan section)  Acute Rehab PT Goals Patient Stated Goal: Return home PT Goal Formulation: With patient Time For Goal Achievement: 07/01/17 Potential to Achieve Goals: Good    Frequency Min 3X/week   Barriers to discharge        Co-evaluation               AM-PAC PT "6 Clicks" Daily Activity  Outcome Measure   Difficulty moving from lying on back to sitting on the side of the bed? : A Little Difficulty sitting down on and standing up from a chair with arms (e.g., wheelchair, bedside commode, etc,.)?: A Little Help needed moving to and from a bed to chair (including a wheelchair)?: A Little Help needed walking in hospital room?: A Little Help needed climbing 3-5 steps with a railing? : A Lot 6 Click Score: 14    End of Session Equipment Utilized During Treatment: Gait belt   Patient left: in bed;with call bell/phone within reach Nurse Communication: Mobility status;Patient requests pain meds PT Visit Diagnosis: Unsteadiness on feet (R26.81);Other abnormalities of gait and mobility (R26.89);Muscle weakness (generalized) (M62.81)    Time: 1101-1130 PT Time Calculation (min) (ACUTE ONLY): 29 min   Charges:   PT Evaluation $PT Eval Low Complexity: 1 Low PT Treatments $Therapeutic Activity: 23-37 mins   PT G Codes:   PT G-Codes **NOT FOR INPATIENT CLASS** Functional Assessment Tool Used: AM-PAC 6 Clicks Basic Mobility Functional Limitation: Mobility: Walking and moving around Mobility: Walking and Moving Around Current Status  (Z6109): At least 40 percent but less than 60 percent impaired, limited or restricted Mobility: Walking and Moving Around Goal Status [phone]): At least 40 percent but less than 60 percent impaired, limited or restricted Mobility: Walking and Moving Around Discharge Status [phone]): At least 40 percent but less than 60 percent impaired, limited or restricted    1:53 PM, 06/24/17 Ocie Bob, MPT Physical Therapist with [facility] 336 [phone] office [phone] mobile phone

## 2017-06-24 NOTE — ED Notes (Signed)
PT ambulating pt in the hall at this time

## 2017-06-24 NOTE — ED Notes (Signed)
Pt calling to the desk asking for pain medication. Pt informed it was not time for him to have any additional medication and that it would be about an hour before I can given him anything else. Pt closed eyes and no further request voiced.

## 2017-06-24 NOTE — Care Management Obs Status (Signed)
MEDICARE OBSERVATION STATUS NOTIFICATION   Patient Details  Name: Lucas Morgan MRN: [id number] Date of Birth: 1941/03/25   Medicare Observation Status Notification Given:  Yes    Malcolm Metro, RN 06/24/2017, 9:47 AM

## 2017-06-24 NOTE — ED Notes (Signed)
Pt able to feed himself. Ate approx 75 % of lunch. Hospitalist at bedside

## 2017-06-24 NOTE — Progress Notes (Signed)
06/23/2017  9:58 PM  06/24/2017 11:26 AM  Lucas Morgan was seen and examined.  The H&P by the admitting provider, orders, imaging was reviewed.  Please see new orders.  Will continue to follow.   [name], MD [facility]

## 2017-06-24 NOTE — ED Notes (Signed)
Lunch tray given.  Daughter at bedside

## 2017-06-24 NOTE — H&P (Signed)
History and Physical    Lucas Morgan:[id number] DOB: 31-Dec-1940 DOA: 06/23/2017  PCP: [facility]   Patient coming from: Home  Chief Complaint: SOB, lethargy   HPI: Lucas Morgan is a 76 y.o. male with medical history significant for coronary artery disease with stents, history of CVA, chronic interstitial lung disease, insulin-dependent diabetes mellitus, and asymptomatic AAA, now presenting to the emergency department for evaluation of lethargy and dyspnea. Patient is brought in by his family after noting him to be increasingly lethargic over the past couple days and complaining of some dyspnea today. Patient reports some mild dyspnea upon arrival, but denies any other complaints. He has been sleeping more than usual, but family is not noted any labored breathing or significant cough. He reports that he had a fever earlier today and they treated him with Tylenol. No other interventions attempted prior to coming in.    ED Course: Upon arrival to the ED, patient is found to be afebrile, saturating well on room air, and with vitals otherwise stable. EKG features a sinus rhythm with nonspecific IVCD. Chest x-ray is notable for diffuse stable chronic interstitial coarsening. Chemistry panel reveals a glucose of 206. CBC is notable for slight thrombus cytopenia with platelets 149,000. Troponin is at the borderline of elevation at 0.03. Urinalysis is suggestive of infection and sample was sent for culture. Patient was treated with 500 mL normal saline, Percocet, and empiric Rocephin in the ED. He remained hemodynamically stable, has not been in any apparent respiratory distress, and will be observed on medical/surgical unit for ongoing evaluation and management of lethargy and fever at home, suspected secondary to UTI.   Review of Systems:  All other systems reviewed and apart from HPI, are negative.  Past Medical History:  Diagnosis Date  . AAA (abdominal aortic  aneurysm) (HCC)    a. Abd U/S 7/14: mild aneurysmal dilatation 3x3 cm; cholelithiasis without evid of cholecystitis => repeat 1 year  . Arthritis    stenosis, lumbar region  . CHF (congestive heart failure) (HCC)   . Chronic interstitial lung disease (HCC) 06/24/2016  . Coronary artery disease   . Diabetes mellitus   . Dyslipidemia   . MI (myocardial infarction) (HCC) 11/09/2008   2.5 x 23 Xience V DES to the CFX  . Osteoporosis   . Renal insufficiency   . Stroke [facility])     Past Surgical History:  Procedure Laterality Date  . BACK SURGERY  2015   lumbar fusion  . CORONARY ANGIOPLASTY    . CORONARY STENT PLACEMENT    . ENDARTERECTOMY Left 06/17/2015   Procedure: Left Carotid ENDARTERECTOMY with Livia Snellen Patch;  Surgeon: Chuck Hint, MD;  Location[facility] OR;  Service: Vascular;  Laterality: Left;  . EP IMPLANTABLE DEVICE N/A 05/26/2015   Procedure: Loop Recorder Insertion;  Surgeon: Marinus Maw, MD;  Location: [facility] INVASIVE CV LAB;  Service: Cardiovascular;  Laterality: N/A;  . 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 JOINT REPLACEMENT Left   . KNEE SURGERY    . LUMBAR LAMINECTOMY/DECOMPRESSION MICRODISCECTOMY N/A 10/18/2014   Procedure: LUMBAR LAMINECTOMY/DECOMPRESSION MICRODISCECTOMYLUMBAR THREE-FOUR ;  Surgeon: Temple Pacini, MD;  Location: [facility] NEURO ORS;  Service: Neurosurgery;  Laterality: N/A;  . TEE WITHOUT CARDIOVERSION N/A 05/26/2015   Procedure: TRANSESOPHAGEAL ECHOCARDIOGRAM (TEE);  Surgeon: Vesta Mixer, MD;  Location: [facility] ENDOSCOPY;  Service: Cardiovascular;  Laterality: N/A;     reports  that he has never smoked. He has never used smokeless tobacco. He reports that he does not drink alcohol or use drugs.  Allergies  Allergen Reactions  . Trazodone And Nefazodone Other (See Comments)    High blood sugar    Family History  Problem Relation Age of Onset  . Heart attack Brother        x2 brothers  . CAD Brother   . Stroke Mother       Prior to Admission medications   Medication Sig Start Date End Date Taking? Authorizing Provider  albuterol (PROVENTIL HFA;VENTOLIN HFA) 108 (90 Base) MCG/ACT inhaler Inhale 2 puffs into the lungs every 6 (six) hours as needed for wheezing or shortness of breath.   Yes [provider]  ALPRAZolam (XANAX) 0.25 MG tablet Take 0.25 mg by mouth at bedtime.   Yes [provider]  aspirin 81 MG tablet Take 1 tablet (81 mg total) by mouth daily. 06/19/15  Yes Ghimire, Werner Lean, MD  atorvastatin (LIPITOR) 80 MG tablet Take 1 tablet (80 mg total) by mouth daily. 09/17/15  Yes BranchDorothe Pea, MD  azelastine (OPTIVAR) 0.05 % ophthalmic solution Place 1 drop into both eyes 2 (two) times daily.    Yes [provider]  baclofen (LIORESAL) 20 MG tablet Take 20 mg by mouth at bedtime.   Yes [provider]  clopidogrel (PLAVIX) 75 MG tablet Take 1 tablet (75 mg total) by mouth daily. 06/19/15  Yes Ghimire, Werner Lean, MD  docusate sodium (COLACE) 100 MG capsule Take 100 mg by mouth 2 (two) times daily.   Yes [provider]  escitalopram (LEXAPRO) 5 MG tablet Take 5 mg by mouth daily.   Yes [provider]  gabapentin (NEURONTIN) 100 MG capsule TAKE 3 CAPSULES(300 MG) BY MOUTH THREE TIMES DAILY Patient taking differently: TAKE 1 CAPSULE (100 MG) BY MOUTH THREE TIMES DAILY 09/15/15  Yes Marvel Plan, MD  insulin detemir (LEVEMIR) 100 UNIT/ML injection Inject 20 Units into the skin at bedtime.   Yes [provider]  magnesium oxide (MAG-OX) 400 (241.3 Mg) MG tablet Take 1 tablet (400 mg total) by mouth daily. 06/25/16  Yes Elliot Cousin, MD  metFORMIN (GLUCOPHAGE) 500 MG tablet Take 250 mg by mouth 2 (two) times daily with a meal.   Yes [provider]  Oxycodone HCl 10 MG TABS Take 10 mg by mouth every 4 (four) hours.    Yes [provider]  pantoprazole (PROTONIX) 40 MG tablet Take 40 mg by mouth daily.   Yes [provider]  primidone (MYSOLINE) 50 MG tablet Take 50 mg by mouth at bedtime.   Yes [provider]  senna-docusate (SENOKOT-S) 8.6-50 MG tablet Take 2 tablets by mouth daily.   Yes [provider]    Physical Exam: Vitals:   06/23/17 2208 06/23/17 2226 06/23/17 2230 06/23/17 2300  BP:  134/75 137/74 122/70  Pulse:  88 92 87  Resp:  19 (!) 24 17  Temp:  98.2 F (36.8 C)    TempSrc:  Oral    SpO2:   98% 95%  Weight: 68.9 kg (152 lb)     Height:  (1.753 m)         Constitutional: NAD, calm, chronically-ill appearing Eyes: PERTLA, lids and conjunctivae normal ENMT: Mucous membranes are moist. Posterior pharynx clear of any exudate or lesions.   Neck: normal, supple, no masses, no thyromegaly Respiratory: Slightly diminished bilaterally with expiratory wheezes. Normal respiratory effort.  No accessory muscle use.  Cardiovascular: S1 & S2 heard, regular rate and rhythm. No extremity edema. No significant JVD. Abdomen: No distension, no tenderness, no masses palpated. Bowel sounds normal.  Musculoskeletal: no clubbing / cyanosis. No joint deformity upper and lower extremities.   Skin: no significant rashes, lesions, ulcers. Warm, dry, well-perfused. Neurologic: No gross facial asymmetry. Sensation intact. Moving all extremities.  Psychiatric: Alert and oriented to person and place only. Calm, cooperative.     Labs on Admission: I have personally reviewed following labs and imaging studies  CBC:  Recent Labs Lab 06/23/17 2219  WBC 9.4  NEUTROABS 6.0  HGB 13.6  HCT 40.2  MCV 92.6  PLT 149*   Basic Metabolic Panel:  Recent Labs Lab 06/23/17 2219  NA 139  K 3.8  CL 103  CO2 25  GLUCOSE 206*  BUN 13  CREATININE 1.18  CALCIUM 8.6*   GFR: Estimated Creatinine Clearance: 52.7 mL/min (by C-G formula based on SCr of 1.18 mg/dL). Liver Function Tests:  Recent Labs Lab 06/23/17 2219  AST 18  ALT 12*  ALKPHOS 81  BILITOT 0.5  PROT 6.9   ALBUMIN 3.3*   No results for input(s): LIPASE, AMYLASE in the last 168 hours. No results for input(s): AMMONIA in the last 168 hours. Coagulation Profile: No results for input(s): INR, PROTIME in the last 168 hours. Cardiac Enzymes:  Recent Labs Lab 06/23/17 2219  TROPONINI 0.03*   BNP (last 3 results) No results for input(s): PROBNP in the last 8760 hours. HbA1C: No results for input(s): HGBA1C in the last 72 hours. CBG: No results for input(s): GLUCAP in the last 168 hours. Lipid Profile: No results for input(s): CHOL, HDL, LDLCALC, TRIG, CHOLHDL, LDLDIRECT in the last 72 hours. Thyroid Function Tests: No results for input(s): TSH, T4TOTAL, FREET4, T3FREE, THYROIDAB in the last 72 hours. Anemia Panel: No results for input(s): VITAMINB12, FOLATE, FERRITIN, TIBC, IRON, RETICCTPCT in the last 72 hours. Urine analysis:    Component Value Date/Time   COLORURINE STRAW (A) 06/23/2017 2220   APPEARANCEUR HAZY (A) 06/23/2017 2220   LABSPEC 1.010 06/23/2017 2220   PHURINE 6.0 06/23/2017 2220   GLUCOSEU 50 (A) 06/23/2017 2220   HGBUR NEGATIVE 06/23/2017 2220   BILIRUBINUR NEGATIVE 06/23/2017 2220   KETONESUR NEGATIVE 06/23/2017 2220   PROTEINUR NEGATIVE 06/23/2017 2220   UROBILINOGEN 1.0 05/30/2015 1022   NITRITE POSITIVE (A) 06/23/2017 2220   LEUKOCYTESUR LARGE (A) 06/23/2017 2220   Sepsis Labs: (procalcitonin:4,lacticidven:4) )No results found for this or any previous visit (from the past 240 hour(s)).   Radiological Exams on Admission: Dg Chest 2 View  Result Date: 06/23/2017 CLINICAL DATA:  Dyspnea and productive cough.  Fever for 2 days. EXAM: CHEST  2 VIEW COMPARISON:  06/23/2016 FINDINGS: Diffuse interstitial coarsening, unchanged and likely fibrotic. No consolidation. No effusions. Hilar, mediastinal and cardiac contours are unremarkable and unchanged. IMPRESSION: Diffuse chronic appearing interstitial coarsening. No consolidation or effusion.  Electronically Signed   By: Ellery Plunk M.D.   On: 06/23/2017 23:10    EKG: Independently reviewed. Sinus rhythm, non-specific IVCD.   Assessment/Plan  1. UTI  - Pt presents with lethargy and SOB, workup suggests UTI  - Urine sent for culture and empiric Rocephin given in ED  - No hx of MDR organisms  - Plan to continue empiric Rocephin pending culture data   2. Chronic interstitial lung disease  - CXR appears unchanged, no hypoxia or distress  - Mild wheezes on exam, respirations unlabored  -  Complained of SOB on arrival, none now, will treat with prn albuterol nebs   3. CAD, elevated troponin  - No anginal complaint  - Troponin slightly elevated to 0.03 on admission - Continue ASA, Plavix, and Lipitor; repeat troponin in several hours    4. Hx of CVA  - No new deficits  - Continue ASA, Plavix, and Lipitor    5. Insulin-dependent DM  - A1c was 8.2% one yr ago  - Managed at home with Levemir 20 units qHS  - Check CBG with meals and qHS, continue Levemir 20 units qHS, and start a low-intensity SSI    6. Chronic pain  - Stable, no pain complaints on admission  - Continue home regimen with baclofen, gabapentin, and oxycodone  - Continue bowel regimen    7. AAA  - Asymptomatic  - Following with cardiology for annual [place], has been stable at 2.9 x 2.9 cm    DVT prophylaxis: sq Lovenox  Code Status: Full  Family Communication: Discussed with patient Disposition Plan: Observe on med-surg Consults called: None Admission status: Observation    Briscoe Deutscher, MD [facility] Pager [phone]  If 7PM-7AM, please contact night-coverage [email or web address] Password [email or web address]  06/24/2017, 12:04 AM

## 2017-06-24 NOTE — ED Notes (Signed)
Respiratory called for breathing tx

## 2017-06-25 DIAGNOSIS — N390 Urinary tract infection, site not specified: Secondary | ICD-10-CM | POA: Diagnosis not present

## 2017-06-25 DIAGNOSIS — G894 Chronic pain syndrome: Secondary | ICD-10-CM | POA: Diagnosis not present

## 2017-06-25 DIAGNOSIS — J849 Interstitial pulmonary disease, unspecified: Secondary | ICD-10-CM | POA: Diagnosis not present

## 2017-06-25 DIAGNOSIS — I2510 Atherosclerotic heart disease of native coronary artery without angina pectoris: Secondary | ICD-10-CM

## 2017-06-25 LAB — CBC WITH DIFFERENTIAL/PLATELET
BASOS ABS: 0.1 10*3/uL (ref 0.0–0.1)
Basophils Relative: 1 %
EOS PCT: 10 %
Eosinophils Absolute: 1 10*3/uL — ABNORMAL HIGH (ref 0.0–0.7)
HEMATOCRIT: 40.7 % (ref 39.0–52.0)
HEMOGLOBIN: 13.9 g/dL (ref 13.0–17.0)
LYMPHS ABS: 2 10*3/uL (ref 0.7–4.0)
LYMPHS PCT: 20 %
MCH: 31.2 pg (ref 26.0–34.0)
MCHC: 34.2 g/dL (ref 30.0–36.0)
MCV: 91.5 fL (ref 78.0–100.0)
Monocytes Absolute: 1 10*3/uL (ref 0.1–1.0)
Monocytes Relative: 10 %
NEUTROS ABS: 6 10*3/uL (ref 1.7–7.7)
Neutrophils Relative %: 59 %
PLATELETS: 183 10*3/uL (ref 150–400)
RBC: 4.45 MIL/uL (ref 4.22–5.81)
RDW: 12.9 % (ref 11.5–15.5)
WBC: 10.1 10*3/uL (ref 4.0–10.5)

## 2017-06-25 LAB — GLUCOSE, CAPILLARY
GLUCOSE-CAPILLARY: 174 mg/dL — AB (ref 65–99)
Glucose-Capillary: 111 mg/dL — ABNORMAL HIGH (ref 65–99)

## 2017-06-25 LAB — MRSA PCR SCREENING: MRSA BY PCR: NEGATIVE

## 2017-06-25 MED ORDER — GABAPENTIN 100 MG PO CAPS: ORAL_CAPSULE | ORAL | 3 refills | Status: AC

## 2017-06-25 MED ORDER — CEFPODOXIME PROXETIL 200 MG PO TABS: 200.0000 mg | ORAL_TABLET | Freq: Two times a day (BID) | ORAL | 0 refills | Status: DC

## 2017-06-25 NOTE — Discharge Instructions (Signed)
Follow with Primary MD  [facility]  and other consultant's as instructed your Hospitalist MD ° °Please get a complete blood count and chemistry panel checked by your Primary MD at your next visit, and again as instructed by your Primary MD. ° °Get Medicines reviewed and adjusted: °Please take all your medications with you for your next visit with your Primary MD ° °Laboratory/radiological data: °Please request your Primary MD to go over all hospital tests and procedure/radiological results at the follow up, please ask your Primary MD to get all Hospital records sent to his/her office. ° °In some cases, they will be blood work, cultures and biopsy results pending at the time of your discharge. Please request that your primary care M.D. follows up on these results. ° °Also Note the following: °If you experience worsening of your admission symptoms, develop shortness of breath, life threatening emergency, suicidal or homicidal thoughts you must seek medical attention immediately by calling 911 or calling your MD immediately  if symptoms less severe. ° °You must read complete instructions/literature along with all the possible adverse reactions/side effects for all the Medicines you take and that have been prescribed to you. Take any new Medicines after you have completely understood and accpet all the possible adverse reactions/side effects.  ° °Do not drive when taking Pain medications or sleeping medications (Benzodaizepines) ° °Do not take more than prescribed Pain, Sleep and Anxiety Medications. It is not advisable to combine anxiety,sleep and pain medications without talking with your primary care practitioner ° °Special Instructions: If you have smoked or chewed Tobacco  in the last 2 yrs please stop smoking, stop any regular Alcohol  and or any Recreational drug use. ° °Wear Seat belts while driving. ° °Please note: °You were cared for by a hospitalist during your hospital stay. Once  you are discharged, your primary care physician will handle any further medical issues. Please note that NO REFILLS for any discharge medications will be authorized once you are discharged, as it is imperative that you return to your primary care physician (or establish a relationship with a primary care physician if you do not have one) for your post hospital discharge needs so that they can reassess your need for medications and monitor your lab values. ° ° ° ° °

## 2017-06-25 NOTE — Discharge Summary (Signed)
Physician Discharge Summary  Lucas Morgan ZOX:[id number] DOB: Oct 21, 1940 DOA: 06/23/2017  PCP: [facility]  Admit date: 06/23/2017 Discharge date: 06/25/2017  Admitted From: Home  Disposition: Home with home health services  Recommendations for Outpatient Follow-up:  1. Follow up with PCP in 1 weeks 2. Please obtain BMP/CBC in one week 3. Please assist patient with outpatient SNF placement 4. Please follow up on the following pending results: final urine culture results  Home Health: PT, RN, SW  Discharge Condition: STABLE   CODE STATUS: FULL    Brief Hospitalization Summary: Please see all hospital notes, images, labs for full details of the hospitalization. HPI: Lucas Morgan is a 76 y.o. male with medical history significant for coronary artery disease with stents, history of CVA, chronic interstitial lung disease, insulin-dependent diabetes mellitus, and asymptomatic AAA, now presenting to the emergency department for evaluation of lethargy and dyspnea. Patient is brought in by his family after noting him to be increasingly lethargic over the past couple days and complaining of some dyspnea today. Patient reports some mild dyspnea upon arrival, but denies any other complaints. He has been sleeping more than usual, but family is not noted any labored breathing or significant cough. He reports that he had a fever earlier today and they treated him with Tylenol. No other interventions attempted prior to coming in.    ED Course: Upon arrival to the ED, patient is found to be afebrile, saturating well on room air, and with vitals otherwise stable. EKG features a sinus rhythm with nonspecific IVCD. Chest x-ray is notable for diffuse stable chronic interstitial coarsening. Chemistry panel reveals a glucose of 206. CBC is notable for slight thrombus cytopenia with platelets 149,000. Troponin is at the borderline of elevation at 0.03. Urinalysis is suggestive of  infection and sample was sent for culture. Patient was treated with 500 mL normal saline, Percocet, and empiric Rocephin in the ED. He remained hemodynamically stable, has not been in any apparent respiratory distress, and will be observed on medical/surgical unit for ongoing evaluation and management of lethargy and fever at home, suspected secondary to UTI.   The patient was admitted with a severe UTI and started on IV ceftriaxone. The patient responded well to this treatment. He was given supportive therapy with IV fluids and responded well to that. Final culture data is still pending at this time. He is going to be discharged on oral Cefpodoxime 200 mg twice a day 7 days. His chronic interstitial lung disease remains stable chest x-ray appeared unchanged. He had an x-ray of his shoulder which revealed before meals osteoarthritis and degenerative changes. He was continued on his home medications for cerebrovascular disease. His diabetes was monitored and remained stable in the hospital. He is discharged with follow-up with primary care physician next week to follow up final culture results. Physical therapy evaluated him and recommended skilled nursing. Because the patient was admitted under observation and does not have a 3 night qualifying stay he cannot be admitted to a skilled nursing facility from the hospital. We will arrange for home health PT, RN and social work to help assist him with outpatient arrangement of skilled nursing placement.  Discharge Diagnoses:  Principal Problem:   Acute lower UTI Active Problems:   CAD, NATIVE VESSEL   AAA (abdominal aortic aneurysm) (HCC)   Chronic pain syndrome   Type 2 diabetes mellitus with stage 2 chronic kidney disease, with long-term current use of insulin (HCC)   Elevated  troponin   Chronic interstitial lung disease (HCC)   UTI (urinary tract infection)   History of CVA (cerebrovascular accident)  Discharge Instructions: Discharge Instructions     Call MD for:  difficulty breathing, headache or visual disturbances    Complete by:  As directed    Call MD for:  extreme fatigue    Complete by:  As directed    Call MD for:  hives    Complete by:  As directed    Call MD for:  persistant dizziness or light-headedness    Complete by:  As directed    Call MD for:  persistant nausea and vomiting    Complete by:  As directed    Call MD for:  severe uncontrolled pain    Complete by:  As directed    Call MD for:  temperature >100.4    Complete by:  As directed    Increase activity slowly    Complete by:  As directed      Allergies as of 06/25/2017      Reactions   Trazodone And Nefazodone Other (See Comments)   High blood sugar      Medication List    TAKE these medications   albuterol 108 (90 Base) MCG/ACT inhaler Commonly known as:  PROVENTIL HFA;VENTOLIN HFA Inhale 2 puffs into the lungs every 6 (six) hours as needed for wheezing or shortness of breath.   ALPRAZolam 0.25 MG tablet Commonly known as:  XANAX Take 0.25 mg by mouth at bedtime.   aspirin 81 MG tablet Take 1 tablet (81 mg total) by mouth daily.   atorvastatin 80 MG tablet Commonly known as:  LIPITOR Take 1 tablet (80 mg total) by mouth daily.   azelastine 0.05 % ophthalmic solution Commonly known as:  OPTIVAR Place 1 drop into both eyes 2 (two) times daily.   baclofen 20 MG tablet Commonly known as:  LIORESAL Take 20 mg by mouth at bedtime.   cefpodoxime 200 MG tablet Commonly known as:  VANTIN Take 1 tablet (200 mg total) by mouth 2 (two) times daily.   clopidogrel 75 MG tablet Commonly known as:  PLAVIX Take 1 tablet (75 mg total) by mouth daily.   docusate sodium 100 MG capsule Commonly known as:  COLACE Take 100 mg by mouth 2 (two) times daily.   escitalopram 5 MG tablet Commonly known as:  LEXAPRO Take 5 mg by mouth daily.   gabapentin 100 MG capsule Commonly known as:  NEURONTIN TAKE 1 CAPSULE (100 MG) BY MOUTH THREE TIMES DAILY    insulin detemir 100 UNIT/ML injection Commonly known as:  LEVEMIR Inject 20 Units into the skin at bedtime.   magnesium oxide 400 (241.3 Mg) MG tablet Commonly known as:  MAG-OX Take 1 tablet (400 mg total) by mouth daily.   metFORMIN 500 MG tablet Commonly known as:  GLUCOPHAGE Take 250 mg by mouth 2 (two) times daily with a meal.   Oxycodone HCl 10 MG Tabs Take 10 mg by mouth every 4 (four) hours.   pantoprazole 40 MG tablet Commonly known as:  PROTONIX Take 40 mg by mouth daily.   primidone 50 MG tablet Commonly known as:  MYSOLINE Take 50 mg by mouth at bedtime.   senna-docusate 8.6-50 MG tablet Commonly known as:  Senokot-S Take 2 tablets by mouth daily.      Follow-up Information    [facility] Schedule an appointment as soon as possible for a visit in 5  day(s).   Why:  Hospital Follow Up Contact information: [address] [place] [address] [phone]          Allergies  Allergen Reactions  . Trazodone And Nefazodone Other (See Comments)    High blood sugar   Current Discharge Medication List    START taking these medications   Details  cefpodoxime (VANTIN) 200 MG tablet Take 1 tablet (200 mg total) by mouth 2 (two) times daily. Qty: 14 tablet, Refills: 0      CONTINUE these medications which have CHANGED   Details  gabapentin (NEURONTIN) 100 MG capsule TAKE 1 CAPSULE (100 MG) BY MOUTH THREE TIMES DAILY Qty: 90 capsule, Refills: 3      CONTINUE these medications which have NOT CHANGED   Details  albuterol (PROVENTIL HFA;VENTOLIN HFA) 108 (90 Base) MCG/ACT inhaler Inhale 2 puffs into the lungs every 6 (six) hours as needed for wheezing or shortness of breath.    ALPRAZolam (XANAX) 0.25 MG tablet Take 0.25 mg by mouth at bedtime.    aspirin 81 MG tablet Take 1 tablet (81 mg total) by mouth daily.    atorvastatin (LIPITOR) 80 MG tablet Take 1 tablet (80 mg total) by mouth daily. Qty: 90 tablet, Refills: 3     azelastine (OPTIVAR) 0.05 % ophthalmic solution Place 1 drop into both eyes 2 (two) times daily.     baclofen (LIORESAL) 20 MG tablet Take 20 mg by mouth at bedtime.    clopidogrel (PLAVIX) 75 MG tablet Take 1 tablet (75 mg total) by mouth daily. Qty: 30 tablet, Refills: 0    docusate sodium (COLACE) 100 MG capsule Take 100 mg by mouth 2 (two) times daily.    escitalopram (LEXAPRO) 5 MG tablet Take 5 mg by mouth daily.    insulin detemir (LEVEMIR) 100 UNIT/ML injection Inject 20 Units into the skin at bedtime.    magnesium oxide (MAG-OX) 400 (241.3 Mg) MG tablet Take 1 tablet (400 mg total) by mouth daily.    metFORMIN (GLUCOPHAGE) 500 MG tablet Take 250 mg by mouth 2 (two) times daily with a meal.    Oxycodone HCl 10 MG TABS Take 10 mg by mouth every 4 (four) hours.     pantoprazole (PROTONIX) 40 MG tablet Take 40 mg by mouth daily.    primidone (MYSOLINE) 50 MG tablet Take 50 mg by mouth at bedtime.    senna-docusate (SENOKOT-S) 8.6-50 MG tablet Take 2 tablets by mouth daily.        Procedures/Studies: Dg Chest 2 View  Result Date: 06/23/2017 CLINICAL DATA:  Dyspnea and productive cough.  Fever for 2 days. EXAM: CHEST  2 VIEW COMPARISON:  06/23/2016 FINDINGS: Diffuse interstitial coarsening, unchanged and likely fibrotic. No consolidation. No effusions. Hilar, mediastinal and cardiac contours are unremarkable and unchanged. IMPRESSION: Diffuse chronic appearing interstitial coarsening. No consolidation or effusion. Electronically Signed   By: Ellery Plunk M.D.   On: 06/23/2017 23:10   Dg Shoulder Right  Result Date: 06/24/2017 CLINICAL DATA:  RIGHT shoulder pain, fell 1 week ago, pain since EXAM: RIGHT SHOULDER - 2+ VIEW COMPARISON:  None available; prior exam on the time line from 12/15/2015 is not in PACs for comparison FINDINGS: Osseous demineralization. Degenerative changes at RIGHT [facility] joint with joint space narrowing and spur formation/hypertrophy. No acute  fracture, dislocation, or bone destruction. Visualized RIGHT ribs intact. Potential radiopaque foreign body on the AP view is not seen on remaining views consistent with superimposed artifact. IMPRESSION: Degenerative changes RIGHT AC joint.  No definite acute bony abnormalities. Electronically Signed   By: Ulyses Southward M.D.   On: 06/24/2017 17:03      Subjective: The patient is without complaints he reports he is feeling much better.  Discharge Exam: Vitals:   06/24/17 2035 06/25/17 0404  BP: 140/76 111/68  Pulse: 99 92  Resp: 20 18  Temp: 98.3 F (36.8 C) 98.2 F (36.8 C)  SpO2: 96% 99%   Vitals:   06/24/17 1246 06/24/17 1706 06/24/17 2035 06/25/17 0404  BP:  (!) 142/72 140/76 111/68  Pulse: 95 96 99 92  Resp: 20 20 20 18   Temp: 98.1 F (36.7 C) 98.4 F (36.9 C) 98.3 F (36.8 C) 98.2 F (36.8 C)  TempSrc: Oral Oral Oral Oral  SpO2: 95% 96% 96% 99%  Weight:  69.7 kg (153 lb 10.6 oz)  69 kg (152 lb 1.9 oz)  Height:  5\' 9"  (1.753 m)     General: Pt is alert, awake, not in acute distress Cardiovascular: Normal S1/S2 +, no rubs, no gallops Respiratory: CTA bilaterally, no wheezing, no rhonchi Abdominal: Soft, NT, ND, bowel sounds + Extremities: no cyanosis   The results of significant diagnostics from this hospitalization (including imaging, microbiology, ancillary and laboratory) are listed below for reference.     Microbiology: Recent Results (from the past 240 hour(s))  MRSA PCR Screening     Status: None   Collection Time: 06/24/17 10:39 PM  Result Value Ref Range Status   MRSA by PCR NEGATIVE NEGATIVE Final    Comment:        The GeneXpert MRSA Assay (FDA approved for NASAL specimens only), is one component of a comprehensive MRSA colonization surveillance program. It is not intended to diagnose MRSA infection nor to guide or monitor treatment for MRSA infections.      Labs: BNP (last 3 results) No results for input(s): BNP in the last 8760 hours. Basic  Metabolic Panel:  Recent Labs Lab 06/23/17 2219  NA 139  K 3.8  CL 103  CO2 25  GLUCOSE 206*  BUN 13  CREATININE 1.18  CALCIUM 8.6*   Liver Function Tests:  Recent Labs Lab 06/23/17 2219  AST 18  ALT 12*  ALKPHOS 81  BILITOT 0.5  PROT 6.9  ALBUMIN 3.3*   No results for input(s): LIPASE, AMYLASE in the last 168 hours. No results for input(s): AMMONIA in the last 168 hours. CBC:  Recent Labs Lab 06/23/17 2219 06/24/17 0544 06/25/17 0911  WBC 9.4 11.3* 10.1  NEUTROABS 6.0 7.3 6.0  HGB 13.6 13.4 13.9  HCT 40.2 40.0 40.7  MCV 92.6 93.5 91.5  PLT 149* 155 183   Cardiac Enzymes:  Recent Labs Lab 06/23/17 2219 06/24/17 0544  TROPONINI 0.03* <0.03   BNP: Invalid input(s): POCBNP CBG:  Recent Labs Lab 06/24/17 0721 06/24/17 1249 06/24/17 1719 06/24/17 2039 06/25/17 0746  GLUCAP 186* 226* 219* 254* 111*   D-Dimer No results for input(s): DDIMER in the last 72 hours. Hgb A1c No results for input(s): HGBA1C in the last 72 hours. Lipid Profile No results for input(s): CHOL, HDL, LDLCALC, TRIG, CHOLHDL, LDLDIRECT in the last 72 hours. Thyroid function studies No results for input(s): TSH, T4TOTAL, T3FREE, THYROIDAB in the last 72 hours.  Invalid input(s): FREET3 Anemia work up No results for input(s): VITAMINB12, FOLATE, FERRITIN, TIBC, IRON, RETICCTPCT in the last 72 hours. Urinalysis    Component Value Date/Time   COLORURINE STRAW (A) 06/23/2017 2220   APPEARANCEUR HAZY (A) 06/23/2017 2220  LABSPEC 1.010 06/23/2017 2220   PHURINE 6.0 06/23/2017 2220   GLUCOSEU 50 (A) 06/23/2017 2220   HGBUR NEGATIVE 06/23/2017 2220   BILIRUBINUR NEGATIVE 06/23/2017 2220   KETONESUR NEGATIVE 06/23/2017 2220   PROTEINUR NEGATIVE 06/23/2017 2220   UROBILINOGEN 1.0 05/30/2015 1022   NITRITE POSITIVE (A) 06/23/2017 2220   LEUKOCYTESUR LARGE (A) 06/23/2017 2220   Sepsis Labs Invalid input(s): PROCALCITONIN,  WBC,  LACTICIDVEN Microbiology Recent Results  (from the past 240 hour(s))  MRSA PCR Screening     Status: None   Collection Time: 06/24/17 10:39 PM  Result Value Ref Range Status   MRSA by PCR NEGATIVE NEGATIVE Final    Comment:        The GeneXpert MRSA Assay (FDA approved for NASAL specimens only), is one component of a comprehensive MRSA colonization surveillance program. It is not intended to diagnose MRSA infection nor to guide or monitor treatment for MRSA infections.     Time coordinating discharge:   SIGNED:  Standley Dakins, MD  [facility] 06/25/2017, 11:08 AM Pager [phone]  If 7PM-7AM, please contact night-coverage [email or web address] Password [email or web address]

## 2017-06-25 NOTE — Care Management Note (Signed)
Case Management Note  Patient Details  Name: RYON LAYTON MRN: [id number] Date of Birth: 06/18/1941  Subjective/Objective:  76 y.o. M to be discharged with resumption of [facility] services with [facility]. Spoke with Eunice Blase, daughter at bedside who is willing to call contact for that agency when they get home to let them know they will be staying at another residence until power is restored at patients residence. No other CM needs at this time. Jamse Arn, RN.                   Action/Plan:CM will sign off for now but will be available should additional discharge needs arise or disposition change.    Expected Discharge Date:  06/25/17               Expected Discharge Plan:     In-House Referral:  NA  Discharge planning Services  CM Consult  Post Acute Care Choice:  Home Health, Resumption of Svcs/PTA Provider Choice offered to:  Patient, Adult Children ([name] (Daughter) at bedside)  DME Arranged:  N/A DME Agency:  NA  HH Arranged:  PT, OT, Social Work [employer] Agency:  [facility]  Status of Service:  Completed, signed off  If discussed at [employer], dates discussed:    Additional Comments:  Yvone Neu, RN 06/25/2017, 12:49 PM

## 2017-06-26 ENCOUNTER — Telehealth: Payer: Self-pay | Admitting: Family Medicine

## 2017-06-26 LAB — URINE CULTURE
Culture: >=100000 — AB
Special Requests: NORMAL

## 2017-06-26 MED ORDER — NITROFURANTOIN MONOHYD MACRO 100 MG PO CAPS: 100.0000 mg | ORAL_CAPSULE | Freq: Two times a day (BID) | ORAL | 0 refills | Status: AC

## 2017-06-26 NOTE — Progress Notes (Signed)
Discharge instructions given to daughter who verbalized understanding, out in stable condition via w/c with staff. 

## 2017-06-26 NOTE — Telephone Encounter (Signed)
Reviewed urine culture results and changed antibiotic to nitrofurantoin.  I called and left message for patient to go and pick up new antibiotic prescription and discontinue previous antibiotic.  [name] MD

## 2017-06-26 NOTE — Progress Notes (Signed)
06/26/2017 9:06 AM  I received urine culture and needed to change antibiotics.  I sent patient new prescription for nitrofurantoin based on c&s results.  I called and left message for patient to pick up new RX and discontinue previous antibiotic.   [name] MD

## 2017-07-12 ENCOUNTER — Emergency Department (HOSPITAL_COMMUNITY): Payer: Medicare Other

## 2017-07-12 ENCOUNTER — Encounter (HOSPITAL_COMMUNITY): Payer: Self-pay | Admitting: *Deleted

## 2017-07-12 ENCOUNTER — Inpatient Hospital Stay (HOSPITAL_COMMUNITY)
Admission: EM | Admit: 2017-07-12 | Discharge: 2017-07-16 | DRG: 872 | Disposition: A | Discharge status: Skilled Nursing Facility | Payer: Medicare Other | Attending: Family Medicine | Admitting: Family Medicine

## 2017-07-12 DIAGNOSIS — I2510 Atherosclerotic heart disease of native coronary artery without angina pectoris: Secondary | ICD-10-CM | POA: Diagnosis not present

## 2017-07-12 DIAGNOSIS — I5042 Chronic combined systolic (congestive) and diastolic (congestive) heart failure: Secondary | ICD-10-CM | POA: Diagnosis present

## 2017-07-12 DIAGNOSIS — Z955 Presence of coronary angioplasty implant and graft: Secondary | ICD-10-CM | POA: Diagnosis not present

## 2017-07-12 DIAGNOSIS — R4182 Altered mental status, unspecified: Secondary | ICD-10-CM | POA: Diagnosis present

## 2017-07-12 DIAGNOSIS — Z8249 Family history of ischemic heart disease and other diseases of the circulatory system: Secondary | ICD-10-CM | POA: Diagnosis not present

## 2017-07-12 DIAGNOSIS — I130 Hypertensive heart and chronic kidney disease with heart failure and stage 1 through stage 4 chronic kidney disease, or unspecified chronic kidney disease: Secondary | ICD-10-CM | POA: Diagnosis present

## 2017-07-12 DIAGNOSIS — Z888 Allergy status to other drugs, medicaments and biological substances status: Secondary | ICD-10-CM | POA: Diagnosis not present

## 2017-07-12 DIAGNOSIS — Z96652 Presence of left artificial knee joint: Secondary | ICD-10-CM | POA: Diagnosis present

## 2017-07-12 DIAGNOSIS — N390 Urinary tract infection, site not specified: Secondary | ICD-10-CM | POA: Diagnosis not present

## 2017-07-12 DIAGNOSIS — E860 Dehydration: Secondary | ICD-10-CM | POA: Diagnosis present

## 2017-07-12 DIAGNOSIS — A419 Sepsis, unspecified organism: Secondary | ICD-10-CM | POA: Diagnosis not present

## 2017-07-12 DIAGNOSIS — E1122 Type 2 diabetes mellitus with diabetic chronic kidney disease: Secondary | ICD-10-CM

## 2017-07-12 DIAGNOSIS — Z981 Arthrodesis status: Secondary | ICD-10-CM

## 2017-07-12 DIAGNOSIS — Z8673 Personal history of transient ischemic attack (TIA), and cerebral infarction without residual deficits: Secondary | ICD-10-CM | POA: Diagnosis not present

## 2017-07-12 DIAGNOSIS — Z7982 Long term (current) use of aspirin: Secondary | ICD-10-CM | POA: Diagnosis not present

## 2017-07-12 DIAGNOSIS — E785 Hyperlipidemia, unspecified: Secondary | ICD-10-CM | POA: Diagnosis present

## 2017-07-12 DIAGNOSIS — R823 Hemoglobinuria: Secondary | ICD-10-CM | POA: Diagnosis present

## 2017-07-12 DIAGNOSIS — N182 Chronic kidney disease, stage 2 (mild): Secondary | ICD-10-CM | POA: Diagnosis present

## 2017-07-12 DIAGNOSIS — J849 Interstitial pulmonary disease, unspecified: Secondary | ICD-10-CM | POA: Diagnosis not present

## 2017-07-12 DIAGNOSIS — M1990 Unspecified osteoarthritis, unspecified site: Secondary | ICD-10-CM | POA: Diagnosis present

## 2017-07-12 DIAGNOSIS — N179 Acute kidney failure, unspecified: Secondary | ICD-10-CM | POA: Diagnosis not present

## 2017-07-12 DIAGNOSIS — M549 Dorsalgia, unspecified: Secondary | ICD-10-CM | POA: Diagnosis present

## 2017-07-12 DIAGNOSIS — Y92009 Unspecified place in unspecified non-institutional (private) residence as the place of occurrence of the external cause: Secondary | ICD-10-CM

## 2017-07-12 DIAGNOSIS — M810 Age-related osteoporosis without current pathological fracture: Secondary | ICD-10-CM | POA: Diagnosis present

## 2017-07-12 DIAGNOSIS — G8929 Other chronic pain: Secondary | ICD-10-CM | POA: Diagnosis present

## 2017-07-12 DIAGNOSIS — I255 Ischemic cardiomyopathy: Secondary | ICD-10-CM | POA: Diagnosis present

## 2017-07-12 DIAGNOSIS — I252 Old myocardial infarction: Secondary | ICD-10-CM | POA: Diagnosis not present

## 2017-07-12 DIAGNOSIS — I69351 Hemiplegia and hemiparesis following cerebral infarction affecting right dominant side: Secondary | ICD-10-CM | POA: Diagnosis not present

## 2017-07-12 DIAGNOSIS — Z7902 Long term (current) use of antithrombotics/antiplatelets: Secondary | ICD-10-CM | POA: Diagnosis not present

## 2017-07-12 DIAGNOSIS — N10 Acute tubulo-interstitial nephritis: Secondary | ICD-10-CM | POA: Diagnosis present

## 2017-07-12 DIAGNOSIS — Z823 Family history of stroke: Secondary | ICD-10-CM

## 2017-07-12 DIAGNOSIS — Z794 Long term (current) use of insulin: Secondary | ICD-10-CM | POA: Diagnosis not present

## 2017-07-12 DIAGNOSIS — E1151 Type 2 diabetes mellitus with diabetic peripheral angiopathy without gangrene: Secondary | ICD-10-CM | POA: Diagnosis present

## 2017-07-12 DIAGNOSIS — I714 Abdominal aortic aneurysm, without rupture: Secondary | ICD-10-CM | POA: Diagnosis present

## 2017-07-12 DIAGNOSIS — R809 Proteinuria, unspecified: Secondary | ICD-10-CM | POA: Diagnosis present

## 2017-07-12 DIAGNOSIS — W19XXXD Unspecified fall, subsequent encounter: Secondary | ICD-10-CM

## 2017-07-12 LAB — COMPREHENSIVE METABOLIC PANEL
ALBUMIN: 3.5 g/dL (ref 3.5–5.0)
ALK PHOS: 94 U/L (ref 38–126)
ALT: 12 U/L — AB (ref 17–63)
ANION GAP: 9 (ref 5–15)
AST: 16 U/L (ref 15–41)
BUN: 22 mg/dL — ABNORMAL HIGH (ref 6–20)
CALCIUM: 8.9 mg/dL (ref 8.9–10.3)
CO2: 27 mmol/L (ref 22–32)
CREATININE: 1.66 mg/dL — AB (ref 0.61–1.24)
Chloride: 101 mmol/L (ref 101–111)
GFR calc Af Amer: 45 mL/min — ABNORMAL LOW (ref >60)
GFR calc non Af Amer: 39 mL/min — ABNORMAL LOW (ref >60)
GLUCOSE: 224 mg/dL — AB (ref 65–99)
Potassium: 4.4 mmol/L (ref 3.5–5.1)
SODIUM: 137 mmol/L (ref 135–145)
Total Bilirubin: 0.4 mg/dL (ref 0.3–1.2)
Total Protein: 7.5 g/dL (ref 6.5–8.1)

## 2017-07-12 LAB — CBC WITH DIFFERENTIAL/PLATELET
BASOS PCT: 0 %
Basophils Absolute: 0 10*3/uL (ref 0.0–0.1)
EOS ABS: 0.4 10*3/uL (ref 0.0–0.7)
Eosinophils Relative: 2 %
HCT: 40.7 % (ref 39.0–52.0)
HEMOGLOBIN: 14.1 g/dL (ref 13.0–17.0)
Lymphocytes Relative: 8 %
Lymphs Abs: 1.5 10*3/uL (ref 0.7–4.0)
MCH: 31.4 pg (ref 26.0–34.0)
MCHC: 34.6 g/dL (ref 30.0–36.0)
MCV: 90.6 fL (ref 78.0–100.0)
Monocytes Absolute: 1.4 10*3/uL — ABNORMAL HIGH (ref 0.1–1.0)
Monocytes Relative: 8 %
NEUTROS PCT: 82 %
Neutro Abs: 15.5 10*3/uL — ABNORMAL HIGH (ref 1.7–7.7)
Platelets: 223 10*3/uL (ref 150–400)
RBC: 4.49 MIL/uL (ref 4.22–5.81)
RDW: 12.8 % (ref 11.5–15.5)
WBC: 18.8 10*3/uL — AB (ref 4.0–10.5)

## 2017-07-12 LAB — URINALYSIS, ROUTINE W REFLEX MICROSCOPIC
Bilirubin Urine: NEGATIVE
GLUCOSE, UA: 50 mg/dL — AB
Ketones, ur: NEGATIVE mg/dL
NITRITE: POSITIVE — AB
PH: 5 (ref 5.0–8.0)
PROTEIN: 30 mg/dL — AB
Specific Gravity, Urine: 1.013 (ref 1.005–1.030)

## 2017-07-12 LAB — LACTIC ACID, PLASMA: Lactic Acid, Venous: 2 mmol/L (ref 0.5–1.9)

## 2017-07-12 LAB — BRAIN NATRIURETIC PEPTIDE: B NATRIURETIC PEPTIDE 5: 99 pg/mL (ref 0.0–100.0)

## 2017-07-12 LAB — TROPONIN I: Troponin I: <0.03 ng/mL (ref <0.03)

## 2017-07-12 LAB — GLUCOSE, CAPILLARY: GLUCOSE-CAPILLARY: 171 mg/dL — AB (ref 65–99)

## 2017-07-12 MED ORDER — SODIUM CHLORIDE 0.9 % IV SOLN
1.0000 g | Freq: Two times a day (BID) | INTRAVENOUS | Status: DC
  Administered 2017-07-12 – 2017-07-16 (×8): 1 g via INTRAVENOUS
  Filled 2017-07-12 (×12): qty 1

## 2017-07-12 MED ORDER — CEFTRIAXONE SODIUM 1 G IJ SOLR
1.0000 g | Freq: Once | INTRAMUSCULAR | Status: AC
  Administered 2017-07-12: 1 g via INTRAVENOUS
  Filled 2017-07-12: qty 10

## 2017-07-12 MED ORDER — IPRATROPIUM-ALBUTEROL 0.5-2.5 (3) MG/3ML IN SOLN: 3.0000 mL | Freq: Four times a day (QID) | RESPIRATORY_TRACT | Status: DC | PRN

## 2017-07-12 MED ORDER — DOCUSATE SODIUM 100 MG PO CAPS
100.0000 mg | ORAL_CAPSULE | Freq: Two times a day (BID) | ORAL | Status: DC
  Administered 2017-07-13 – 2017-07-16 (×7): 100 mg via ORAL
  Filled 2017-07-12 (×8): qty 1

## 2017-07-12 MED ORDER — GABAPENTIN 100 MG PO CAPS
100.0000 mg | ORAL_CAPSULE | Freq: Three times a day (TID) | ORAL | Status: DC
  Administered 2017-07-13 – 2017-07-14 (×6): 100 mg via ORAL
  Filled 2017-07-12 (×7): qty 1

## 2017-07-12 MED ORDER — PRIMIDONE 50 MG PO TABS
50.0000 mg | ORAL_TABLET | Freq: Every day | ORAL | Status: DC
  Administered 2017-07-13 – 2017-07-15 (×3): 50 mg via ORAL
  Filled 2017-07-12 (×4): qty 1

## 2017-07-12 MED ORDER — ATORVASTATIN CALCIUM 40 MG PO TABS
80.0000 mg | ORAL_TABLET | Freq: Every day | ORAL | Status: DC
  Administered 2017-07-13 – 2017-07-15 (×3): 80 mg via ORAL
  Filled 2017-07-12 (×3): qty 2

## 2017-07-12 MED ORDER — CLOPIDOGREL BISULFATE 75 MG PO TABS
75.0000 mg | ORAL_TABLET | Freq: Every day | ORAL | Status: DC
  Administered 2017-07-13 – 2017-07-16 (×4): 75 mg via ORAL
  Filled 2017-07-12 (×4): qty 1

## 2017-07-12 MED ORDER — SENNOSIDES-DOCUSATE SODIUM 8.6-50 MG PO TABS
2.0000 | ORAL_TABLET | Freq: Every day | ORAL | Status: DC
  Administered 2017-07-13 – 2017-07-16 (×4): 2 via ORAL
  Filled 2017-07-12 (×4): qty 2

## 2017-07-12 MED ORDER — PANTOPRAZOLE SODIUM 40 MG PO TBEC
40.0000 mg | DELAYED_RELEASE_TABLET | Freq: Every day | ORAL | Status: DC
  Administered 2017-07-13 – 2017-07-16 (×4): 40 mg via ORAL
  Filled 2017-07-12 (×4): qty 1

## 2017-07-12 MED ORDER — HEPARIN SODIUM (PORCINE) 5000 UNIT/ML IJ SOLN
5000.0000 [IU] | Freq: Three times a day (TID) | INTRAMUSCULAR | Status: DC
  Administered 2017-07-12 – 2017-07-16 (×11): 5000 [IU] via SUBCUTANEOUS
  Filled 2017-07-12 (×11): qty 1

## 2017-07-12 MED ORDER — CITALOPRAM HYDROBROMIDE 20 MG PO TABS
10.0000 mg | ORAL_TABLET | Freq: Every day | ORAL | Status: DC
  Administered 2017-07-13 – 2017-07-16 (×4): 10 mg via ORAL
  Filled 2017-07-12 (×4): qty 1

## 2017-07-12 MED ORDER — ONDANSETRON HCL 4 MG/2ML IJ SOLN: 4.0000 mg | Freq: Four times a day (QID) | INTRAMUSCULAR | Status: DC | PRN

## 2017-07-12 MED ORDER — INSULIN ASPART 100 UNIT/ML ~~LOC~~ SOLN
0.0000 [IU] | Freq: Three times a day (TID) | SUBCUTANEOUS | Status: DC
  Administered 2017-07-13: 2 [IU] via SUBCUTANEOUS
  Administered 2017-07-13: 1 [IU] via SUBCUTANEOUS
  Administered 2017-07-14: 2 [IU] via SUBCUTANEOUS
  Administered 2017-07-14 – 2017-07-15 (×2): 1 [IU] via SUBCUTANEOUS
  Administered 2017-07-15: 2 [IU] via SUBCUTANEOUS
  Administered 2017-07-16: 3 [IU] via SUBCUTANEOUS

## 2017-07-12 MED ORDER — ONDANSETRON HCL 4 MG PO TABS: 4.0000 mg | ORAL_TABLET | Freq: Four times a day (QID) | ORAL | Status: DC | PRN

## 2017-07-12 MED ORDER — KETOTIFEN FUMARATE 0.025 % OP SOLN
1.0000 [drp] | Freq: Two times a day (BID) | OPHTHALMIC | Status: DC
  Administered 2017-07-12 – 2017-07-16 (×8): 1 [drp] via OPHTHALMIC
  Filled 2017-07-12: qty 5

## 2017-07-12 MED ORDER — LORAZEPAM 2 MG/ML IJ SOLN
0.5000 mg | Freq: Once | INTRAMUSCULAR | Status: DC
  Filled 2017-07-12: qty 1

## 2017-07-12 MED ORDER — MAGNESIUM OXIDE 400 (241.3 MG) MG PO TABS
400.0000 mg | ORAL_TABLET | Freq: Every day | ORAL | Status: DC
  Administered 2017-07-13 – 2017-07-16 (×4): 400 mg via ORAL
  Filled 2017-07-12 (×4): qty 1

## 2017-07-12 MED ORDER — BACLOFEN 10 MG PO TABS
20.0000 mg | ORAL_TABLET | Freq: Every day | ORAL | Status: DC
  Administered 2017-07-13 – 2017-07-15 (×3): 20 mg via ORAL
  Filled 2017-07-12 (×4): qty 2

## 2017-07-12 MED ORDER — ASPIRIN 81 MG PO CHEW
81.0000 mg | CHEWABLE_TABLET | Freq: Every day | ORAL | Status: DC
  Administered 2017-07-13 – 2017-07-16 (×4): 81 mg via ORAL
  Filled 2017-07-12 (×5): qty 1

## 2017-07-12 MED ORDER — ALPRAZOLAM 0.25 MG PO TABS: 0.2500 mg | ORAL_TABLET | Freq: Every day | ORAL | Status: DC

## 2017-07-12 MED ORDER — SODIUM CHLORIDE 0.45 % IV SOLN
INTRAVENOUS | Status: AC
  Administered 2017-07-12 – 2017-07-13 (×2): via INTRAVENOUS

## 2017-07-12 MED ORDER — LORAZEPAM 2 MG/ML IJ SOLN
0.7500 mg | Freq: Once | INTRAMUSCULAR | Status: AC
  Administered 2017-07-12: 0.75 mg via INTRAVENOUS

## 2017-07-12 MED ORDER — DIPHENHYDRAMINE HCL 50 MG/ML IJ SOLN
12.5000 mg | Freq: Once | INTRAMUSCULAR | Status: AC
  Administered 2017-07-12: 12.5 mg via INTRAVENOUS
  Filled 2017-07-12: qty 1

## 2017-07-12 MED ORDER — INSULIN DETEMIR 100 UNIT/ML ~~LOC~~ SOLN
20.0000 [IU] | Freq: Every day | SUBCUTANEOUS | Status: DC
  Administered 2017-07-12 – 2017-07-15 (×4): 20 [IU] via SUBCUTANEOUS
  Filled 2017-07-12 (×6): qty 0.2

## 2017-07-12 MED ORDER — SODIUM CHLORIDE 0.9 % IV BOLUS (SEPSIS)
500.0000 mL | Freq: Once | INTRAVENOUS | Status: AC
  Administered 2017-07-12: 500 mL via INTRAVENOUS

## 2017-07-12 MED ORDER — ALPRAZOLAM 0.25 MG PO TABS
0.2500 mg | ORAL_TABLET | Freq: Three times a day (TID) | ORAL | Status: DC | PRN
  Administered 2017-07-13 – 2017-07-14 (×2): 0.25 mg via ORAL
  Filled 2017-07-12 (×2): qty 1

## 2017-07-12 NOTE — H&P (Signed)
History and Physical    Lucas Morgan ZOX:[id number] DOB: Oct 14, 1940 DOA: 07/12/2017  PCP: [facility]   Patient coming from: Home.  I have personally briefly reviewed patient's old medical records in [facility] Link  Chief Complaint: Altered mental status and weakness.  HPI: Lucas Morgan is a 76 y.o. male with medical history significant of AAA, osteoarthritis, CAD, chronic combined systolic and diastolic CHF, chronic interstitial lung disease, type 2 diabetes, hypertension, osteoporosis, renal insufficiency, history of CVA with residual right-sided hemiparesis who was brought to the emergency department via [facility] after the patient became increasingly confused at home for the past 2 days. He was recently admitted and discharged earlier this month due to acute lower UTI. He has an indwelling Foley. No further information available from the patient at this time.  ED Course: Initial vital signs in the emergency department temperature 97.22F, pulse 89, respirations 18, blood pressure 135/67 with O2 sat of 93% on room air. He was given Rocephin IVPB in the emergency department.  His workup shows an urinalysis with TNTC WBC, positive nitrites, small hemoglobinuria with 6-30 RBC on microscopic, mild proteinuria at 30 mg/dL. WBC of 18.8 with 82% neutrophils, hemoglobin 14.1 g/dL and platelets 811. Troponin level and BMP were normal. Lactic acid was 2.0. His BUN was 22, creatinine 1.66 and glucose 224 mg/dL, the rest of the CMP was unremarkable.  Review of Systems: Unable to obtain due to the patient's mental status..    Past Medical History:  Diagnosis Date  . AAA (abdominal aortic aneurysm) (HCC)    a. Abd U/S 7/14: mild aneurysmal dilatation 3x3 cm; cholelithiasis without evid of cholecystitis => repeat 1 year  . Arthritis    stenosis, lumbar region  . CHF (congestive heart failure) (HCC)   . Chronic interstitial lung disease (HCC) 06/24/2016  .  Coronary artery disease   . Diabetes mellitus   . Dyslipidemia   . MI (myocardial infarction) (HCC) 11/09/2008   2.5 x 23 Xience V DES to the CFX  . Osteoporosis   . Renal insufficiency   . Stroke [facility])     Past Surgical History:  Procedure Laterality Date  . BACK SURGERY  2015   lumbar fusion  . CORONARY ANGIOPLASTY    . CORONARY STENT PLACEMENT    . ENDARTERECTOMY Left 06/17/2015   Procedure: Left Carotid ENDARTERECTOMY with Livia Snellen Patch;  Surgeon: Chuck Hint, MD;  Location: [facility] OR;  Service: Vascular;  Laterality: Left;  . EP IMPLANTABLE DEVICE N/A 05/26/2015   Procedure: Loop Recorder Insertion;  Surgeon: Marinus Maw, MD;  Location: [facility] INVASIVE CV LAB;  Service: Cardiovascular;  Laterality: N/A;  . 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 JOINT REPLACEMENT Left   . KNEE SURGERY    . LUMBAR LAMINECTOMY/DECOMPRESSION MICRODISCECTOMY N/A 10/18/2014   Procedure: LUMBAR LAMINECTOMY/DECOMPRESSION MICRODISCECTOMYLUMBAR THREE-FOUR ;  Surgeon: Temple Pacini, MD;  Location: [facility] NEURO ORS;  Service: Neurosurgery;  Laterality: N/A;  . TEE WITHOUT CARDIOVERSION N/A 05/26/2015   Procedure: TRANSESOPHAGEAL ECHOCARDIOGRAM (TEE);  Surgeon: Vesta Mixer, MD;  Location: [facility] ENDOSCOPY;  Service: Cardiovascular;  Laterality: N/A;     reports that he has never smoked. He has never used smokeless tobacco. He reports that he does not drink alcohol or use drugs.  Allergies  Allergen Reactions  . Trazodone And Nefazodone Other (See Comments)    High blood sugar    Family  History  Problem Relation Age of Onset  . Heart attack Brother        x2 brothers  . CAD Brother   . Stroke Mother     Prior to Admission medications   Medication Sig Start Date End Date Taking? Authorizing Provider  albuterol (PROVENTIL HFA;VENTOLIN HFA) 108 (90 Base) MCG/ACT inhaler Inhale 2 puffs into the lungs every 6 (six) hours as needed for wheezing or shortness of breath.     [provider]  ALPRAZolam Prudy Feeler) 0.25 MG tablet Take 0.25 mg by mouth at bedtime.    [provider]  aspirin 81 MG tablet Take 1 tablet (81 mg total) by mouth daily. 06/19/15   Ghimire, Werner Lean, MD  atorvastatin (LIPITOR) 80 MG tablet Take 1 tablet (80 mg total) by mouth daily. 09/17/15   Antoine Poche, MD  azelastine (OPTIVAR) 0.05 % ophthalmic solution Place 1 drop into both eyes 2 (two) times daily.     [provider]  baclofen (LIORESAL) 20 MG tablet Take 20 mg by mouth at bedtime.    [provider]  clopidogrel (PLAVIX) 75 MG tablet Take 1 tablet (75 mg total) by mouth daily. 06/19/15   Ghimire, Werner Lean, MD  docusate sodium (COLACE) 100 MG capsule Take 100 mg by mouth 2 (two) times daily.    [provider]  escitalopram (LEXAPRO) 5 MG tablet Take 5 mg by mouth daily.    [provider]  gabapentin (NEURONTIN) 100 MG capsule TAKE 1 CAPSULE (100 MG) BY MOUTH THREE TIMES DAILY 06/25/17   Johnson, Clanford L, MD  insulin detemir (LEVEMIR) 100 UNIT/ML injection Inject 20 Units into the skin at bedtime.    [provider]  magnesium oxide (MAG-OX) 400 (241.3 Mg) MG tablet Take 1 tablet (400 mg total) by mouth daily. 06/25/16   Elliot Cousin, MD  metFORMIN (GLUCOPHAGE) 500 MG tablet Take 250 mg by mouth 2 (two) times daily with a meal.    [provider]  Oxycodone HCl 10 MG TABS Take 10 mg by mouth every 4 (four) hours.     [provider]  pantoprazole (PROTONIX) 40 MG tablet Take 40 mg by mouth daily.    [provider]  primidone (MYSOLINE) 50 MG tablet Take 50 mg by mouth at bedtime.    [provider]  senna-docusate (SENOKOT-S) 8.6-50 MG tablet Take 2 tablets by mouth daily.    [provider]    Physical Exam: Vitals:   07/12/17 1638 07/12/17 1730 07/12/17 1830  BP: 135/67 104/70 (!) 107/92  Pulse: 89 87 97  Resp: 18 18 20   Temp: (!) 97.5 F (36.4 C)    TempSrc:  Oral    SpO2: 93% 92% 99%  Weight: 68.9 kg (152 lb)    Height: 5\' 9"  (1.753 m)      Constitutional: Restless, disoriented, answers simple questions. Eyes: PERRL, lids and conjunctivae normal ENMT: Mucous membranes are dry. Posterior pharynx clear of any exudate or lesions. Neck: normal, supple, no masses, no thyromegaly Respiratory: clear to auscultation bilaterally, no wheezing, no crackles. Normal respiratory effort. No accessory muscle use.  Cardiovascular: Regular rate and rhythm, no murmurs / rubs / gallops. No extremity edema. 2+ pedal pulses. No carotid bruits.  Abdomen: Soft, no tenderness, no masses palpated. No hepatosplenomegaly. Bowel sounds positive.  Musculoskeletal: no clubbing / cyanosis. Good ROM, no contractures. Normal muscle tone.  Skin: no significant rashes, lesions, ulcers on limited skin exam Neurologic: CN 2-12 grossly intact.  Sensation intact, DTR normal. Right-sided hemiparesis. Psychiatric: Alert and oriented x 1. Disoriented to place, time, date and situation. Restless mood.   Labs on Admission: I have personally reviewed following labs and imaging studies  CBC:  Recent Labs Lab 07/12/17 1657  WBC 18.8*  NEUTROABS 15.5*  HGB 14.1  HCT 40.7  MCV 90.6  PLT 223   Basic Metabolic Panel:  Recent Labs Lab 07/12/17 1657  NA 137  K 4.4  CL 101  CO2 27  GLUCOSE 224*  BUN 22*  CREATININE 1.66*  CALCIUM 8.9   GFR: Estimated Creatinine Clearance: 37.5 mL/min (A) (by C-G formula based on SCr of 1.66 mg/dL (H)). Liver Function Tests:  Recent Labs Lab 07/12/17 1657  AST 16  ALT 12*  ALKPHOS 94  BILITOT 0.4  PROT 7.5  ALBUMIN 3.5   No results for input(s): LIPASE, AMYLASE in the last 168 hours. No results for input(s): AMMONIA in the last 168 hours. Coagulation Profile: No results for input(s): INR, PROTIME in the last 168 hours. Cardiac Enzymes:  Recent Labs Lab 07/12/17 1657  TROPONINI <0.03   BNP (last 3 results) No results for  input(s): PROBNP in the last 8760 hours. HbA1C: No results for input(s): HGBA1C in the last 72 hours. CBG: No results for input(s): GLUCAP in the last 168 hours. Lipid Profile: No results for input(s): CHOL, HDL, LDLCALC, TRIG, CHOLHDL, LDLDIRECT in the last 72 hours. Thyroid Function Tests: No results for input(s): TSH, T4TOTAL, FREET4, T3FREE, THYROIDAB in the last 72 hours. Anemia Panel: No results for input(s): VITAMINB12, FOLATE, FERRITIN, TIBC, IRON, RETICCTPCT in the last 72 hours. Urine analysis:    Component Value Date/Time   COLORURINE YELLOW 07/12/2017 1737   APPEARANCEUR CLOUDY (A) 07/12/2017 1737   LABSPEC 1.013 07/12/2017 1737   PHURINE 5.0 07/12/2017 1737   GLUCOSEU 50 (A) 07/12/2017 1737   HGBUR SMALL (A) 07/12/2017 1737   BILIRUBINUR NEGATIVE 07/12/2017 1737   KETONESUR NEGATIVE 07/12/2017 1737   PROTEINUR 30 (A) 07/12/2017 1737   UROBILINOGEN 1.0 05/30/2015 1022   NITRITE POSITIVE (A) 07/12/2017 1737   LEUKOCYTESUR LARGE (A) 07/12/2017 1737    Radiological Exams on Admission: Ct Head Wo Contrast  Result Date: 07/12/2017 CLINICAL DATA:  Weakness all over, worse than normal. Treated for urinary tract infection. EXAM: CT HEAD WITHOUT CONTRAST TECHNIQUE: Contiguous axial images were obtained from the base of the skull through the vertex without intravenous contrast. COMPARISON:  Most recent CT 06/21/2016. FINDINGS: Brain: No evidence for acute infarction, hemorrhage, mass lesion, hydrocephalus, or extra-axial fluid. Generalized atrophy. Evidence for multiple chronic medium to large vessel infarcts, bihemispheric, clearly worst in the LEFT frontal and posterior frontal region, stable from prior scans. Scattered areas of lacunar infarction, most notable RIGHT caudate. No definite acute cerebral ischemia. Chronic microvascular ischemic change throughout the white matter. Vascular: Calcification of the cavernous internal carotid arteries consistent with cerebrovascular  atherosclerotic disease. No signs of intracranial large vessel occlusion. Skull: Normal. Negative for fracture or focal lesion. Sinuses/Orbits: No layering sinus fluid. Dense lenticular opacity on the RIGHT. Calcified shrunken globe on the LEFT. Other: Mastoids are clear. IMPRESSION: Atrophy and small vessel disease.  Multiple old infarcts. No definite acute cerebral ischemia. No intracranial hemorrhage or shift. Calcified shrunken LEFT globe. Electronically Signed   By: Elsie StainJohn T Curnes M.D.   On: 07/12/2017 18:24   Dg Chest Portable 1 View  Result Date: 07/12/2017 CLINICAL DATA:  76 y/o  M; shortness of breath. EXAM: PORTABLE CHEST 1 VIEW COMPARISON:  06/23/2017 chest radiograph. FINDINGS: Stable cardiac silhouette given rotation and technique. Aortic atherosclerosis with calcification. Stable coarse reticular opacities of the lungs. No focal consolidation. No pleural effusion or pneumothorax. IMPRESSION: Stable coarse reticular opacities of the lung which may represent chronic interstitial edema or pneumonitis. No focal consolidation. Electronically Signed   By: Mitzi Hansen M.D.   On: 07/12/2017 17:16  06/22/2016 echocardiogram complete  ------------------------------------------------------------------- LV EF: 35% -   40%  ------------------------------------------------------------------- Indications:      Chest pain 786.51.  ------------------------------------------------------------------- History:   PMH:   Coronary artery disease.  PMH:  Altered Mental Status. AAA, CVA, Aphasia, Right Hemiparesis, Adjust disorder with anxious mood, Ischemic Cardiomyopathy.  Risk factors:  Diabetes mellitus. Dyslipidemia.  ------------------------------------------------------------------- Study Conclusions  - Left ventricle: The cavity size was normal. There was mild   concentric hypertrophy. Systolic function was moderately reduced.   The estimated ejection fraction was in the range  of 35% to 40%.   Doppler parameters are consistent with abnormal left ventricular   relaxation (grade 1 diastolic dysfunction). Indeterminate filling   pressures. - Regional wall motion abnormality: Akinesis of the mid inferior   myocardium; severe hypokinesis of the basal inferolateral   myocardium. Diffuse hypokinesis also noted. - Aortic valve: Mildly to moderately calcified annulus. Mildly   thickened, mildly calcified leaflets. Sclerosis without stenosis. - Mitral valve: Calcified annulus. Mildly thickened leaflets .   There was mild regurgitation. - Left atrium: The atrium was mildly to moderately dilated.  EKG: Independently reviewed. Vent. rate 88 BPM PR interval * ms QRS duration 139 ms QT/QTc 392/475 ms P-R-T axes 50 -53 65 Sinus rhythm Probable left atrial enlargement Right bundle branch block Not significantly changed from previous tracings.  Assessment/Plan Principal Problem:   Sepsis secondary to UTI [facility]) Admitted to telemetry/inpatient. Continue supplemental oxygen. Continue time-limited IV hydration. Will use meropenem per pharmacy due to most recent UC&S growing ESBL Escherichia coli. Follow-up urine culture and sensitivity. Adjust antibiotic therapy depending on these results.  Active Problems:   Altered mental status Secondary to above. Continue time-limited IV hydration. Continue IV antibiotics. Supportive care.    CAD, NATIVE VESSEL Continue aspirin, clopidogrel and atorvastatin.    Chronic combined systolic and diastolic congestive heart failure (HCC) Seems to be compensated at this time. BNP is normal at 99 pg/mL Monitor intake and output.    Chronic interstitial lung disease (HCC) Continue supplemental oxygen. Continue bronchodilators as needed.    Type 2 diabetes mellitus with stage 2 chronic kidney disease,     with long-term current use of insulin (HCC) Carbohydrate modified diet. Continue Levemir 20 units SQ at bedtime. Hold  metformin due to mild lactic acidosis. Check hemoglobin A1c in the morning.    HLD (hyperlipidemia) Continue atorvastatin 80 mg by mouth daily. LFTs were normal.    AKI (acute kidney injury) (HCC) Continue time-limited IV hydration. Monitor intake and output. Follow-up renal function and electrolytes.    History of CVA (cerebrovascular accident) Supportive care. Consider PT evaluation once clinically improved. May need temporary placement.    Generalized weakness Secondary to dehydration and UTI. Consider PT evaluation in 24-48 hours if clinically improved.    DVT prophylaxis: Heparin SQ. Code Status: Full code. Family Communication:  Disposition Plan: Admit for IV antibiotic therapy for several days. Consults called:  Admission status: Inpatient/telemetry.   Bobette Mo MD [facility] Pager [phone].  If 7PM-7AM, please contact night-coverage [email or web address] Password [email or web address]  07/12/2017, 8:28 PM

## 2017-07-12 NOTE — ED Notes (Signed)
Lab at bedside

## 2017-07-12 NOTE — Progress Notes (Signed)
Pharmacy Antibiotic Note  Lucas EatonJohn C Bautch is a 76 y.o. male admitted on 07/12/2017 with UTI.  Pharmacy has been consulted for Meropenem dosing.  Plan: Meropenem 1gm IV every 12 hours. Monitor labs, micro and vitals.   Height: 5\' 9"  (175.3 cm) Weight: 152 lb (68.9 kg) IBW/kg (Calculated) : 70.7  Temp (24hrs), Avg:97.5 F (36.4 C), Min:97.5 F (36.4 C), Max:97.5 F (36.4 C)   Recent Labs Lab 07/12/17 1657 07/12/17 1705  WBC 18.8*  --   CREATININE 1.66*  --   LATICACIDVEN  --  2.0*    Estimated Creatinine Clearance: 37.5 mL/min (A) (by C-G formula based on SCr of 1.66 mg/dL (H)).    Allergies  Allergen Reactions  . Trazodone And Nefazodone Other (See Comments)    High blood sugar    Antimicrobials this admission: Meropenem 10/30 >>    >>   Dose adjustments this admission: n/a   Microbiology results:  BCx:  10/30 UCx: pending 10/12 UCx: ESBL E.Coli   Sputum:    MRSA PCR:   Thank you for allowing pharmacy to be a part of this patient's care.  Mady GemmaHayes, Daden Mahany R 07/12/2017 8:24 PM

## 2017-07-12 NOTE — ED Provider Notes (Signed)
[facility] EMERGENCY DEPARTMENT Provider Note   CSN: [id number] Arrival date & time: 07/12/17  1625     History   Chief Complaint Chief Complaint  Patient presents with  . Weakness    HPI Lucas Morgan is a 76 y.o. male.  Patient's daughter states the patient has become more confused over the last couple days and the last time this happened he had a bad bladder infection patient has an indwelling Foley   The history is provided by a relative. No language interpreter was used.  Weakness  Primary symptoms include no focal weakness. This is a new problem. The current episode started 2 days ago. The problem has not changed since onset.There was no focality noted. There has been no fever. Pertinent negatives include no shortness of breath. There were no medications administered prior to arrival.    Past Medical History:  Diagnosis Date  . AAA (abdominal aortic aneurysm) (HCC)    a. Abd U/S 7/14: mild aneurysmal dilatation 3x3 cm; cholelithiasis without evid of cholecystitis => repeat 1 year  . Arthritis    stenosis, lumbar region  . CHF (congestive heart failure) (HCC)   . Chronic interstitial lung disease (HCC) 06/24/2016  . Coronary artery disease   . Diabetes mellitus   . Dyslipidemia   . MI (myocardial infarction) (HCC) 11/09/2008   2.5 x 23 Xience V DES to the CFX  . Osteoporosis   . Renal insufficiency   . Stroke [facility])     Patient Active Problem List   Diagnosis Date Noted  . Sepsis secondary to UTI (HCC) 07/12/2017  . UTI (urinary tract infection) 06/24/2017  . History of CVA (cerebrovascular accident) 06/24/2017  . Acute lower UTI 06/23/2017  . Urinary retention 06/25/2016  . Chronic interstitial lung disease (HCC) 06/24/2016  . Acute encephalopathy 06/23/2016  . Hypokalemia 06/23/2016  . AKI (acute kidney injury) (HCC)   . Altered mental status 06/21/2016  . Hypomagnesemia 06/21/2016  . Elevated troponin 06/21/2016  . Left leg weakness   . Acute CVA  (cerebrovascular accident) (HCC) 01/14/2016  . Fall at home 01/13/2016  . Ataxia 01/13/2016  . Orthostatic hypotension 01/13/2016  . Hip pain, acute   . 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 Tremor of both hands 11/18/2015  . HLD (hyperlipidemia) 11/18/2015  . Type 2 diabetes mellitus with stage 2 chronic kidney disease, with long-term current use of insulin (HCC) 08/15/2015  . S/P carotid endarterectomy 08/15/2015  . Acute on chronic systolic congestive heart failure (HCC) 08/15/2015  . Cardiomyopathy, ischemic 06/18/2015  . Cerebral infarction due to unspecified mechanism   . Chronic pain syndrome 06/11/2015  . Adjustment disorder with anxious mood 06/11/2015  . Aphasia S/P CVA 05/28/2015  . Right hemiparesis (HCC) 05/28/2015  . Stroke, acute, embolic (HCC) 05/27/2015  . Carotid stenosis   . Acute on chronic renal failure (HCC) 05/24/2015  . CVA (cerebral infarction) 05/24/2015  . Leukocytosis 05/24/2015  . Foreign body in colon 04/08/2015  . Spinal stenosis of lumbar region 10/18/2014  . Lumbar stenosis with neurogenic claudication 10/18/2014  . Thyroid nodule 03/12/2013  . AAA (abdominal aortic aneurysm) (HCC) 03/12/2013  . PULMONARY NODULE 08/14/2010  . COUGH 06/30/2010  . HYPOTENSION 05/04/2010  . AODM 01/27/2009  . CAD, NATIVE VESSEL 12/26/2008    Past Surgical History:  Procedure Laterality Date  . BACK SURGERY  2015   lumbar fusion  . CORONARY ANGIOPLASTY    . CORONARY STENT PLACEMENT    .  ENDARTERECTOMY Left 06/17/2015   Procedure: Left Carotid ENDARTERECTOMY with Livia Snellen Patch;  Surgeon: Chuck Hint, MD;  Location: [facility] OR;  Service: Vascular;  Laterality: Left;  . EP IMPLANTABLE DEVICE N/A 05/26/2015   Procedure: Loop Recorder Insertion;  Surgeon: Marinus Maw, MD;  Location: [facility] INVASIVE CV LAB;  Service: Cardiovascular;  Laterality: N/A;  . 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  JOINT REPLACEMENT Left   . KNEE SURGERY    . LUMBAR LAMINECTOMY/DECOMPRESSION MICRODISCECTOMY N/A 10/18/2014   Procedure: LUMBAR LAMINECTOMY/DECOMPRESSION MICRODISCECTOMYLUMBAR THREE-FOUR ;  Surgeon: Temple Pacini, MD;  Location: [facility] NEURO ORS;  Service: Neurosurgery;  Laterality: N/A;  . TEE WITHOUT CARDIOVERSION N/A 05/26/2015   Procedure: TRANSESOPHAGEAL ECHOCARDIOGRAM (TEE);  Surgeon: Vesta Mixer, MD;  Location: [facility] ENDOSCOPY;  Service: Cardiovascular;  Laterality: N/A;       Home Medications    Prior to Admission medications   Medication Sig Start Date End Date Taking? Authorizing Provider  albuterol (PROVENTIL HFA;VENTOLIN HFA) 108 (90 Base) MCG/ACT inhaler Inhale 2 puffs into the lungs every 6 (six) hours as needed for wheezing or shortness of breath.    [provider]  ALPRAZolam Prudy Feeler) 0.25 MG tablet Take 0.25 mg by mouth at bedtime.    [provider]  aspirin 81 MG tablet Take 1 tablet (81 mg total) by mouth daily. 06/19/15   Ghimire, Werner Lean, MD  atorvastatin (LIPITOR) 80 MG tablet Take 1 tablet (80 mg total) by mouth daily. 09/17/15   Antoine Poche, MD  azelastine (OPTIVAR) 0.05 % ophthalmic solution Place 1 drop into both eyes 2 (two) times daily.     [provider]  baclofen (LIORESAL) 20 MG tablet Take 20 mg by mouth at bedtime.    [provider]  clopidogrel (PLAVIX) 75 MG tablet Take 1 tablet (75 mg total) by mouth daily. 06/19/15   Ghimire, Werner Lean, MD  docusate sodium (COLACE) 100 MG capsule Take 100 mg by mouth 2 (two) times daily.    [provider]  escitalopram (LEXAPRO) 5 MG tablet Take 5 mg by mouth daily.    [provider]  gabapentin (NEURONTIN) 100 MG capsule TAKE 1 CAPSULE (100 MG) BY MOUTH THREE TIMES DAILY 06/25/17   Johnson, Clanford L, MD  insulin detemir (LEVEMIR) 100 UNIT/ML injection Inject 20 Units into the skin at bedtime.    [provider]  magnesium oxide (MAG-OX) 400 (241.3 Mg)  MG tablet Take 1 tablet (400 mg total) by mouth daily. 06/25/16   Elliot Cousin, MD  metFORMIN (GLUCOPHAGE) 500 MG tablet Take 250 mg by mouth 2 (two) times daily with a meal.    [provider]  Oxycodone HCl 10 MG TABS Take 10 mg by mouth every 4 (four) hours.     [provider]  pantoprazole (PROTONIX) 40 MG tablet Take 40 mg by mouth daily.    [provider]  primidone (MYSOLINE) 50 MG tablet Take 50 mg by mouth at bedtime.    [provider]  senna-docusate (SENOKOT-S) 8.6-50 MG tablet Take 2 tablets by mouth daily.    [provider]    Family History Family History  Problem Relation Age of Onset  . Heart attack Brother        x2 brothers  . CAD Brother   . Stroke Mother     Social History Social History  Substance Use Topics  . Smoking status: Never  Smoker  . Smokeless tobacco: Never Used  . Alcohol use No     Allergies   Trazodone and nefazodone   Review of Systems Review of Systems  Unable to perform ROS: Dementia  Respiratory: Negative for shortness of breath.   Neurological: Positive for weakness. Negative for focal weakness.     Physical Exam Updated Vital Signs BP (!) 107/92   Pulse 97   Temp (!) 97.5 F (36.4 C) (Oral)   Resp 20   Ht 5\' 9"  (1.753 m)   Wt 68.9 kg (152 lb)   SpO2 99%   BMI 22.45 kg/m   Physical Exam  Constitutional: He appears well-developed.  HENT:  Head: Normocephalic.  Eyes: Conjunctivae and EOM are normal. No scleral icterus.  Neck: Neck supple. No thyromegaly present.  Cardiovascular: Normal rate and regular rhythm.  Exam reveals no gallop and no friction rub.   No murmur heard. Pulmonary/Chest: No stridor. He has no wheezes. He has no rales. He exhibits no tenderness.  Abdominal: He exhibits no distension. There is no tenderness. There is no rebound.  Musculoskeletal: Normal range of motion. He exhibits no edema.  Profound weakness right arm right leg from old stroke    Lymphadenopathy:    He has no cervical adenopathy.  Neurological: He is alert. He exhibits normal muscle tone. Coordination normal.  Patient only oriented to person  Skin: No rash noted. No erythema.  Psychiatric: He has a normal mood and affect. His behavior is normal.     ED Treatments / Results  Labs (all labs ordered are listed, but only abnormal results are displayed) Labs Reviewed  CBC WITH DIFFERENTIAL/PLATELET - Abnormal; Notable for the following:       Result Value   WBC 18.8 (*)    Neutro Abs 15.5 (*)    Monocytes Absolute 1.4 (*)    All other components within normal limits  COMPREHENSIVE METABOLIC PANEL - Abnormal; Notable for the following:    Glucose, Bld 224 (*)    BUN 22 (*)    Creatinine, Ser 1.66 (*)    ALT 12 (*)    GFR calc non Af Amer 39 (*)    GFR calc Af Amer 45 (*)    All other components within normal limits  LACTIC ACID, PLASMA - Abnormal; Notable for the following:    Lactic Acid, Venous 2.0 (*)    All other components within normal limits  URINALYSIS, ROUTINE W REFLEX MICROSCOPIC - Abnormal; Notable for the following:    APPearance CLOUDY (*)    Glucose, UA 50 (*)    Hgb urine dipstick SMALL (*)    Protein, ur 30 (*)    Nitrite POSITIVE (*)    Leukocytes, UA LARGE (*)    Bacteria, UA FEW (*)    Squamous Epithelial / LPF 0-5 (*)    All other components within normal limits  URINE CULTURE  TROPONIN I  BRAIN NATRIURETIC PEPTIDE    EKG  EKG Interpretation  Date/Time:  Tuesday July 12 2017 16:37:57 EDT Ventricular Rate:  88 PR Interval:    QRS Duration: 139 QT Interval:  392 QTC Calculation: 475 R Axis:   -53 Text Interpretation:  Sinus rhythm Probable left atrial enlargement Right bundle branch block Confirmed by Bethann Berkshire [phone]) on 07/12/2017 6:34:15 PM       Radiology Ct Head Wo Contrast  Result Date: 07/12/2017 CLINICAL DATA:  Weakness all over, worse than normal. Treated for urinary tract infection. EXAM: CT HEAD  WITHOUT  CONTRAST TECHNIQUE: Contiguous axial images were obtained from the base of the skull through the vertex without intravenous contrast. COMPARISON:  Most recent CT 06/21/2016. FINDINGS: Brain: No evidence for acute infarction, hemorrhage, mass lesion, hydrocephalus, or extra-axial fluid. Generalized atrophy. Evidence for multiple chronic medium to large vessel infarcts, bihemispheric, clearly worst in the LEFT frontal and posterior frontal region, stable from prior scans. Scattered areas of lacunar infarction, most notable RIGHT caudate. No definite acute cerebral ischemia. Chronic microvascular ischemic change throughout the white matter. Vascular: Calcification of the cavernous internal carotid arteries consistent with cerebrovascular atherosclerotic disease. No signs of intracranial large vessel occlusion. Skull: Normal. Negative for fracture or focal lesion. Sinuses/Orbits: No layering sinus fluid. Dense lenticular opacity on the RIGHT. Calcified shrunken globe on the LEFT. Other: Mastoids are clear. IMPRESSION: Atrophy and small vessel disease.  Multiple old infarcts. No definite acute cerebral ischemia. No intracranial hemorrhage or shift. Calcified shrunken LEFT globe. Electronically Signed   By: Elsie StainJohn T Curnes M.D.   On: 07/12/2017 18:24   Dg Chest Portable 1 View  Result Date: 07/12/2017 CLINICAL DATA:  76 y/o  M; shortness of breath. EXAM: PORTABLE CHEST 1 VIEW COMPARISON:  06/23/2017 chest radiograph. FINDINGS: Stable cardiac silhouette given rotation and technique. Aortic atherosclerosis with calcification. Stable coarse reticular opacities of the lungs. No focal consolidation. No pleural effusion or pneumothorax. IMPRESSION: Stable coarse reticular opacities of the lung which may represent chronic interstitial edema or pneumonitis. No focal consolidation. Electronically Signed   By: Mitzi HansenLance  Furusawa-Stratton M.D.   On: 07/12/2017 17:16    Procedures Procedures (including critical care  time)  Medications Ordered in ED Medications  sodium chloride 0.9 % bolus 500 mL (0 mLs Intravenous Stopped 07/12/17 1919)  cefTRIAXone (ROCEPHIN) 1 g in dextrose 5 % 50 mL IVPB (0 g Intravenous Stopped 07/12/17 1919)     Initial Impression / Assessment and Plan / ED Course  I have reviewed the triage vital signs and the nursing notes.  Pertinent labs & imaging results that were available during my care of the patient were reviewed by me and considered in my medical decision making (see chart for details).    Patient with altered mental status secondary to urinary tract infection.  He will be admitted to medicine for IV antibiotics  Final Clinical Impressions(s) / ED Diagnoses   Final diagnoses:  Acute pyelonephritis    New Prescriptions New Prescriptions   No medications on file     Bethann BerkshireZammit, Norinne Jeane, MD 07/12/17 2024

## 2017-07-12 NOTE — ED Triage Notes (Addendum)
[facility] They were called out for stroke symptoms. He has hx of right cva with right sided deficits. Has weakness all over that is worse than normal. Pt has foley in place. He is 3 days out from being treated for a UTI. 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

## 2017-07-12 NOTE — ED Notes (Signed)
CRITICAL VALUE ALERT  Critical Value:  Lactic Acid 2.0  Date & Time Notied:  07/12/17 at 1755  Provider Notified: Estell HarpinZammit  Orders Received/Actions taken: EDP made aware

## 2017-07-12 NOTE — ED Notes (Signed)
Pts daughter contact, [name], contact is [phone]. They are expecting a sister named Deloris to come as well.

## 2017-07-13 DIAGNOSIS — N182 Chronic kidney disease, stage 2 (mild): Secondary | ICD-10-CM

## 2017-07-13 DIAGNOSIS — Z794 Long term (current) use of insulin: Secondary | ICD-10-CM

## 2017-07-13 DIAGNOSIS — J849 Interstitial pulmonary disease, unspecified: Secondary | ICD-10-CM

## 2017-07-13 DIAGNOSIS — N390 Urinary tract infection, site not specified: Secondary | ICD-10-CM

## 2017-07-13 DIAGNOSIS — A419 Sepsis, unspecified organism: Principal | ICD-10-CM

## 2017-07-13 DIAGNOSIS — E1122 Type 2 diabetes mellitus with diabetic chronic kidney disease: Secondary | ICD-10-CM

## 2017-07-13 DIAGNOSIS — I5042 Chronic combined systolic (congestive) and diastolic (congestive) heart failure: Secondary | ICD-10-CM | POA: Diagnosis present

## 2017-07-13 DIAGNOSIS — Z8673 Personal history of transient ischemic attack (TIA), and cerebral infarction without residual deficits: Secondary | ICD-10-CM

## 2017-07-13 DIAGNOSIS — N179 Acute kidney failure, unspecified: Secondary | ICD-10-CM

## 2017-07-13 DIAGNOSIS — N10 Acute tubulo-interstitial nephritis: Secondary | ICD-10-CM

## 2017-07-13 LAB — CBC WITH DIFFERENTIAL/PLATELET
Basophils Absolute: 0 10*3/uL (ref 0.0–0.1)
Basophils Relative: 0 %
EOS ABS: 0.1 10*3/uL (ref 0.0–0.7)
EOS PCT: 1 %
HCT: 43.9 % (ref 39.0–52.0)
Hemoglobin: 15 g/dL (ref 13.0–17.0)
LYMPHS ABS: 2.3 10*3/uL (ref 0.7–4.0)
LYMPHS PCT: 14 %
MCH: 30.6 pg (ref 26.0–34.0)
MCHC: 34.2 g/dL (ref 30.0–36.0)
MCV: 89.6 fL (ref 78.0–100.0)
MONO ABS: 1.5 10*3/uL — AB (ref 0.1–1.0)
MONOS PCT: 9 %
Neutro Abs: 11.8 10*3/uL — ABNORMAL HIGH (ref 1.7–7.7)
Neutrophils Relative %: 76 %
PLATELETS: 241 10*3/uL (ref 150–400)
RBC: 4.9 MIL/uL (ref 4.22–5.81)
RDW: 12.8 % (ref 11.5–15.5)
WBC: 15.7 10*3/uL — ABNORMAL HIGH (ref 4.0–10.5)

## 2017-07-13 LAB — BASIC METABOLIC PANEL
ANION GAP: 9 (ref 5–15)
BUN: 17 mg/dL (ref 6–20)
CHLORIDE: 104 mmol/L (ref 101–111)
CO2: 27 mmol/L (ref 22–32)
Calcium: 9.2 mg/dL (ref 8.9–10.3)
Creatinine, Ser: 1.38 mg/dL — ABNORMAL HIGH (ref 0.61–1.24)
GFR calc Af Amer: 56 mL/min — ABNORMAL LOW (ref >60)
GFR calc non Af Amer: 48 mL/min — ABNORMAL LOW (ref >60)
GLUCOSE: 90 mg/dL (ref 65–99)
POTASSIUM: 3.9 mmol/L (ref 3.5–5.1)
Sodium: 140 mmol/L (ref 135–145)

## 2017-07-13 LAB — GLUCOSE, CAPILLARY
GLUCOSE-CAPILLARY: 77 mg/dL (ref 65–99)
GLUCOSE-CAPILLARY: 138 mg/dL — AB (ref 65–99)
GLUCOSE-CAPILLARY: 204 mg/dL — AB (ref 65–99)
Glucose-Capillary: 183 mg/dL — ABNORMAL HIGH (ref 65–99)

## 2017-07-13 LAB — HEMOGLOBIN A1C
Hgb A1c MFr Bld: 7.4 % — ABNORMAL HIGH (ref 4.8–5.6)
Mean Plasma Glucose: 165.68 mg/dL

## 2017-07-13 MED ORDER — OXYCODONE-ACETAMINOPHEN 7.5-325 MG PO TABS
1.0000 | ORAL_TABLET | Freq: Four times a day (QID) | ORAL | Status: DC | PRN
  Administered 2017-07-13 – 2017-07-14 (×5): 1 via ORAL
  Filled 2017-07-13 (×5): qty 1

## 2017-07-13 MED ORDER — ACETAMINOPHEN 325 MG PO TABS: 650.0000 mg | ORAL_TABLET | Freq: Four times a day (QID) | ORAL | Status: DC | PRN

## 2017-07-13 NOTE — Progress Notes (Addendum)
PROGRESS NOTE    Lucas Morgan:[id number]:[id number] DOB: 05/24/1941 DOA: 07/12/2017 PCP: [facility]    Brief Narrative:  Lucas Morgan is a 76 y.o. male with medical history significant of AAA, osteoarthritis, CAD, chronic combined systolic and diastolic CHF, chronic interstitial lung disease, type 2 diabetes, hypertension, osteoporosis, renal insufficiency, history of CVA with residual right-sided hemiparesis who was brought to the emergency department [facility] after the patient became increasingly confused at home for the past 2 days. He was recently admitted and discharged earlier this month due to acute lower UTI. He has an indwelling Foley. No further information available from the patient at this time.  ED Course: Initial vital signs in the emergency department temperature 97.78F, pulse 89, respirations 18, blood pressure 135/67 with O2 sat of 93% on room air. He was given Rocephin IVPB in the emergency department.  His workup shows an urinalysis with TNTC WBC, positive nitrites, small hemoglobinuria with 6-30 RBC on microscopic, mild proteinuria at 30 mg/dL. WBC of 18.8 with 82% neutrophils, hemoglobin 14.1 g/dL and platelets 324223. Troponin level and BMP were normal. Lactic acid was 2.0. His BUN was 22, creatinine 1.66 and glucose 224 mg/dL, the rest of the CMP was unremarkable.   Assessment & Plan:   Principal Problem:   Sepsis secondary to UTI [facility](HCC) Active Problems:   CAD, NATIVE VESSEL   Type 2 diabetes mellitus with stage 2 chronic kidney disease, with long-term current use of insulin (HCC)   HLD (hyperlipidemia)   Altered mental status   AKI (acute kidney injury) (HCC)   Chronic interstitial lung disease (HCC)   History of CVA (cerebrovascular accident)   Chronic combined systolic and diastolic congestive heart failure (HCC)   Sepsis secondary to UTI (HCC) Will use meropenem per pharmacy due to most recent UC&S growing ESBL  Escherichia coli. Follow-up urine culture and sensitivity. Adjust antibiotic therapy depending on these results.  Chronic Back Pain Give percocet at lesser dose than home dose to decrease risk of confusion  Altered mental status Secondary to above. Continue time-limited IV hydration. Continue IV antibiotics. Supportive care.    CAD, NATIVE VESSEL Continue aspirin, clopidogrel and atorvastatin.    Chronic combined systolic and diastolic congestive heart failure (HCC) Compensated at this time BNP is normal at 99 pg/mL Monitor I/O    Chronic interstitial lung disease (HCC) Continue supplemental oxygen. Continue bronchodilators as needed.    Type 2 diabetes mellitus with stage 2 chronic kidney disease,     with long-term current use of insulin (HCC) Carbohydrate modified diet. Continue Levemir 20 units SQ at bedtime. HgA1c 7.4    HLD (hyperlipidemia) Continue atorvastatin 80 mg by mouth daily.    AKI (acute kidney injury) (HCC) Cr improved to 1.38    History of CVA (cerebrovascular accident) PT consult placed    Generalized weakness PT consult   DVT prophylaxis: Heparin SQ Code Status: Full Code Family Communication: No family bedside Disposition Plan: pending improvement    Consultants:   None  Procedures:   None  Antimicrobials:   None    Subjective: Patient is laying in bed.  Mittens on hands.  [facility] but does not know why.  States he has to use the restroom.  Objective: Vitals:   07/12/17 1830 07/12/17 2030 07/13/17 0500 07/13/17 1242  BP: (!) 107/92 (!) 145/89 111/69   Pulse: 97 (!) 42 (!) 104   Resp: 20 18 20  Temp:   97.8 F (36.6 C)   TempSrc:   Oral   SpO2: 99% 94% 93% 94%  Weight:      Height:        Intake/Output Summary (Last 24 hours) at 07/13/17 1339 Last data filed at 07/13/17 1200  Gross per 24 hour  Intake          2348.93 ml  Output             2600 ml  Net          -251.07 ml    Filed Weights   07/12/17 1638  Weight: 68.9 kg (152 lb)    Examination:  General exam: Appears calm and comfortable but disoriented  Respiratory system: Clear to auscultation. Respiratory effort normal. Cardiovascular system: S1 & S2 heard, RRR. No JVD, murmurs, rubs, gallops or clicks. No pedal edema. Gastrointestinal system: Abdomen is nondistended, soft and nontender. No organomegaly or masses felt. Normal bowel sounds heard. Central nervous system: Alert and oriented. No focal neurological deficits. Extremities: able to move arms and legs spontaneously and independently but did not follow commands; mittens on hands bilaterally Skin: No rashes, lesions or ulcers Psychiatry: Confused. Oriented to person and place only     Data Reviewed: I have personally reviewed following labs and imaging studies  CBC:  Recent Labs Lab 07/12/17 1657 07/13/17 0447  WBC 18.8* 15.7*  NEUTROABS 15.5* 11.8*  HGB 14.1 15.0  HCT 40.7 43.9  MCV 90.6 89.6  PLT 223 241   Basic Metabolic Panel:  Recent Labs Lab 07/12/17 1657 07/13/17 0447  NA 137 140  K 4.4 3.9  CL 101 104  CO2 27 27  GLUCOSE 224* 90  BUN 22* 17  CREATININE 1.66* 1.38*  CALCIUM 8.9 9.2   GFR: Estimated Creatinine Clearance: 45.1 mL/min (A) (by C-G formula based on SCr of 1.38 mg/dL (H)). Liver Function Tests:  Recent Labs Lab 07/12/17 1657  AST 16  ALT 12*  ALKPHOS 94  BILITOT 0.4  PROT 7.5  ALBUMIN 3.5   No results for input(s): LIPASE, AMYLASE in the last 168 hours. No results for input(s): AMMONIA in the last 168 hours. Coagulation Profile: No results for input(s): INR, PROTIME in the last 168 hours. Cardiac Enzymes:  Recent Labs Lab 07/12/17 1657  TROPONINI <0.03   BNP (last 3 results) No results for input(s): PROBNP in the last 8760 hours. HbA1C:  Recent Labs  07/13/17 0447  HGBA1C 7.4*   CBG:  Recent Labs Lab 07/12/17 2335 07/13/17 0741 07/13/17 1142  GLUCAP 171* 77 138*    Lipid Profile: No results for input(s): CHOL, HDL, LDLCALC, TRIG, CHOLHDL, LDLDIRECT in the last 72 hours. Thyroid Function Tests: No results for input(s): TSH, T4TOTAL, FREET4, T3FREE, THYROIDAB in the last 72 hours. Anemia Panel: No results for input(s): VITAMINB12, FOLATE, FERRITIN, TIBC, IRON, RETICCTPCT in the last 72 hours. Sepsis Labs:  Recent Labs Lab 07/12/17 1705  LATICACIDVEN 2.0*    No results found for this or any previous visit (from the past 240 hour(s)).       Radiology Studies: Ct Head Wo Contrast  Result Date: 07/12/2017 CLINICAL DATA:  Weakness all over, worse than normal. Treated for urinary tract infection. EXAM: CT HEAD WITHOUT CONTRAST TECHNIQUE: Contiguous axial images were obtained from the base of the skull through the vertex without intravenous contrast. COMPARISON:  Most recent CT 06/21/2016. FINDINGS: Brain: No evidence for acute infarction, hemorrhage, mass lesion, hydrocephalus, or extra-axial fluid. Generalized atrophy. Evidence for  multiple chronic medium to large vessel infarcts, bihemispheric, clearly worst in the LEFT frontal and posterior frontal region, stable from prior scans. Scattered areas of lacunar infarction, most notable RIGHT caudate. No definite acute cerebral ischemia. Chronic microvascular ischemic change throughout the white matter. Vascular: Calcification of the cavernous internal carotid arteries consistent with cerebrovascular atherosclerotic disease. No signs of intracranial large vessel occlusion. Skull: Normal. Negative for fracture or focal lesion. Sinuses/Orbits: No layering sinus fluid. Dense lenticular opacity on the RIGHT. Calcified shrunken globe on the LEFT. Other: Mastoids are clear. IMPRESSION: Atrophy and small vessel disease.  Multiple old infarcts. No definite acute cerebral ischemia. No intracranial hemorrhage or shift. Calcified shrunken LEFT globe. Electronically Signed   By: Elsie Stain M.D.   On: 07/12/2017 18:24    Dg Chest Portable 1 View  Result Date: 07/12/2017 CLINICAL DATA:  76 y/o  M; shortness of breath. EXAM: PORTABLE CHEST 1 VIEW COMPARISON:  06/23/2017 chest radiograph. FINDINGS: Stable cardiac silhouette given rotation and technique. Aortic atherosclerosis with calcification. Stable coarse reticular opacities of the lungs. No focal consolidation. No pleural effusion or pneumothorax. IMPRESSION: Stable coarse reticular opacities of the lung which may represent chronic interstitial edema or pneumonitis. No focal consolidation. Electronically Signed   By: Mitzi Hansen M.D.   On: 07/12/2017 17:16        Scheduled Meds: . aspirin  81 mg Oral Daily  . atorvastatin  80 mg Oral q1800  . baclofen  20 mg Oral QHS  . citalopram  10 mg Oral Daily  . clopidogrel  75 mg Oral Daily  . docusate sodium  100 mg Oral BID  . gabapentin  100 mg Oral TID  . heparin  5,000 Units Subcutaneous Q8H  . insulin aspart  0-9 Units Subcutaneous TID WC  . insulin detemir  20 Units Subcutaneous QHS  . ketotifen  1 drop Both Eyes BID  . magnesium oxide  400 mg Oral Daily  . pantoprazole  40 mg Oral Daily  . primidone  50 mg Oral QHS  . senna-docusate  2 tablet Oral Daily   Continuous Infusions: . sodium chloride 88 mL/hr at 07/13/17 1038  . meropenem (MERREM) IV Stopped (07/13/17 1148)     LOS: 1 day    Time spent: 30 minutes    Katrinka Blazing, MD [facility] Pager [phone]  If 7PM-7AM, please contact night-coverage [email or web address] Password [email or web address] 07/13/2017, 1:39 PM

## 2017-07-13 NOTE — Evaluation (Signed)
Physical Therapy Evaluation Patient Details Name: Lucas EatonJohn C Morgan MRN: [id number] DOB: 05/16/1941 Today's Date: 07/13/2017   History of Present Illness  Lucas EatonJohn C Morgan is a 76 y.o. male with medical history significant of AAA, osteoarthritis, CAD, chronic combined systolic and diastolic CHF, chronic interstitial lung disease, type 2 diabetes, hypertension, osteoporosis, renal insufficiency, history of CVA with residual right-sided hemiparesis who was [facility] after the patient became increasingly confused at home for the past 2 days. He was recently admitted and discharged earlier this month due to acute lower UTI. He has an indwelling Foley. No further information available from the patient at this time.    Clinical Impression  [facility] for a bowel movement, demonstrates slow labored cadence with frequent leaning over RW, limited secondary to c/o fatigue and put back to bed after therapy.  Patient will benefit from continued physical therapy in hospital and recommended venue below to increase strength, balance, endurance for safe ADLs and gait.    Follow Up Recommendations SNF;Supervision/Assistance - 24 hour    Equipment Recommendations  None recommended by PT    Recommendations for Other Services       Precautions / Restrictions Precautions Precautions: Fall Restrictions Weight Bearing Restrictions: No      Mobility  Bed Mobility Overal bed mobility: Needs Assistance Bed Mobility: Supine to Sit;Sit to Supine     Supine to sit: Min guard Sit to supine: Min guard      Transfers Overall transfer level: Needs assistance Equipment used: Rolling walker (2 wheeled) Transfers: Sit to/from [employer] CorporationStand;Stand Pivot Transfers Sit to Stand: Min assist Stand pivot transfers: Min assist          Ambulation/Gait Ambulation/Gait assistance: Min assist Ambulation Distance (Feet): 18 Feet Assistive  device: Rolling walker (2 wheeled) Gait Pattern/deviations: Decreased step length - right;Decreased step length - left;Decreased stride length Gait velocity: slow Gait velocity interpretation: Below normal speed for age/gender General Gait Details: demonstrates slow labored movement with kyphotic trunk, limited secondary to fatigue  Stairs            Wheelchair Mobility    Modified Rankin (Stroke Patients Only)       Balance Overall balance assessment: Needs assistance Sitting-balance support: No upper extremity supported;Feet supported Sitting balance-Leahy Scale: Good     Standing balance support: Bilateral upper extremity supported;During functional activity Standing balance-Leahy Scale: Fair                               Pertinent Vitals/Pain Pain Score: 8  Pain Location: low back Pain Descriptors / Indicators: Aching Pain Intervention(s): Limited activity within patient's tolerance;Monitored during session;Premedicated before session    Home Living Family/patient expects to be discharged to:: Private residence Living Arrangements: Children Available Help at Discharge: Family Type of Home: House Home Access: Level entry     Home Layout: One level Home Equipment: Environmental consultantWalker - 2 wheels;Cane - single point;Bedside commode;Shower seat;Wheelchair - manual;Hospital bed      Prior Function Level of Independence: Independent with assistive device(s)               Hand Dominance        Extremity/Trunk Assessment   Upper Extremity Assessment Upper Extremity Assessment: Generalized weakness    Lower Extremity Assessment Lower Extremity Assessment: Generalized weakness    Cervical / Trunk Assessment Cervical / Trunk Assessment: Kyphotic  Communication  Communication: Expressive difficulties  Cognition Arousal/Alertness: Awake/alert Behavior During Therapy: WFL for tasks assessed/performed Overall Cognitive Status: Within Functional Limits  for tasks assessed                                        General Comments      Exercises     Assessment/Plan    PT Assessment Patient needs continued PT services  PT Problem List Decreased strength;Decreased activity tolerance;Decreased balance;Decreased mobility       PT Treatment Interventions Gait training;Functional mobility training;Therapeutic activities;Therapeutic exercise;Patient/family education    PT Goals (Current goals can be found in the Care Plan section)  Acute Rehab PT Goals Patient Stated Goal: Return home PT Goal Formulation: With patient Time For Goal Achievement: 07/20/17 Potential to Achieve Goals: Good    Frequency Min 3X/week   Barriers to discharge        Co-evaluation               AM-PAC PT "6 Clicks" Daily Activity  Outcome Measure Difficulty turning over in bed (including adjusting bedclothes, sheets and blankets)?: None Difficulty moving from lying on back to sitting on the side of the bed? : A Little Difficulty sitting down on and standing up from a chair with arms (e.g., wheelchair, bedside commode, etc,.)?: A Little Help needed moving to and from a bed to chair (including a wheelchair)?: A Little Help needed walking in hospital room?: A Lot Help needed climbing 3-5 steps with a railing? : A Lot 6 Click Score: 17    End of Session   Activity Tolerance: Patient tolerated treatment well;Patient limited by fatigue Patient left: in bed;with call bell/phone within reach Nurse Communication: Mobility status PT Visit Diagnosis: Unsteadiness on feet (R26.81);Other abnormalities of gait and mobility (R26.89);Muscle weakness (generalized) (M62.81)    Time: 1610-9604 PT Time Calculation (min) (ACUTE ONLY): 30 min   Charges:   PT Evaluation $PT Eval Low Complexity: 1 Low PT Treatments $Therapeutic Activity: 8-22 mins   PT G Codes:   PT G-Codes **NOT FOR INPATIENT CLASS** Functional Assessment Tool Used: AM-PAC 6  Clicks Basic Mobility Functional Limitation: Mobility: Walking and moving around Mobility: Walking and Moving Around Current Status (V4098): At least 40 percent but less than 60 percent impaired, limited or restricted Mobility: Walking and Moving Around Goal Status [phone]): At least 40 percent but less than 60 percent impaired, limited or restricted Mobility: Walking and Moving Around Discharge Status [phone]): At least 40 percent but less than 60 percent impaired, limited or restricted    4:03 PM, 07/13/17 Ocie Bob, MPT Physical Therapist with [facility] 336 [phone] office [phone] mobile phone

## 2017-07-14 DIAGNOSIS — I2510 Atherosclerotic heart disease of native coronary artery without angina pectoris: Secondary | ICD-10-CM

## 2017-07-14 DIAGNOSIS — E785 Hyperlipidemia, unspecified: Secondary | ICD-10-CM

## 2017-07-14 DIAGNOSIS — R4182 Altered mental status, unspecified: Secondary | ICD-10-CM

## 2017-07-14 LAB — CBC WITH DIFFERENTIAL/PLATELET
BASOS PCT: 1 %
Basophils Absolute: 0.1 10*3/uL (ref 0.0–0.1)
EOS ABS: 0.6 10*3/uL (ref 0.0–0.7)
Eosinophils Relative: 6 %
HCT: 39.7 % (ref 39.0–52.0)
HEMOGLOBIN: 13.5 g/dL (ref 13.0–17.0)
Lymphocytes Relative: 31 %
Lymphs Abs: 3.1 10*3/uL (ref 0.7–4.0)
MCH: 30.8 pg (ref 26.0–34.0)
MCHC: 34 g/dL (ref 30.0–36.0)
MCV: 90.6 fL (ref 78.0–100.0)
MONOS PCT: 13 %
Monocytes Absolute: 1.2 10*3/uL — ABNORMAL HIGH (ref 0.1–1.0)
NEUTROS PCT: 49 %
Neutro Abs: 4.8 10*3/uL (ref 1.7–7.7)
Platelets: 230 10*3/uL (ref 150–400)
RBC: 4.38 MIL/uL (ref 4.22–5.81)
RDW: 13 % (ref 11.5–15.5)
WBC: 9.8 10*3/uL (ref 4.0–10.5)

## 2017-07-14 LAB — GLUCOSE, CAPILLARY
GLUCOSE-CAPILLARY: 72 mg/dL (ref 65–99)
GLUCOSE-CAPILLARY: 103 mg/dL — AB (ref 65–99)
GLUCOSE-CAPILLARY: 160 mg/dL — AB (ref 65–99)
Glucose-Capillary: 139 mg/dL — ABNORMAL HIGH (ref 65–99)
Glucose-Capillary: 201 mg/dL — ABNORMAL HIGH (ref 65–99)

## 2017-07-14 LAB — BASIC METABOLIC PANEL
Anion gap: 9 (ref 5–15)
BUN: 18 mg/dL (ref 6–20)
CHLORIDE: 101 mmol/L (ref 101–111)
CO2: 25 mmol/L (ref 22–32)
CREATININE: 1.32 mg/dL — AB (ref 0.61–1.24)
Calcium: 8.7 mg/dL — ABNORMAL LOW (ref 8.9–10.3)
GFR calc non Af Amer: 51 mL/min — ABNORMAL LOW (ref >60)
GFR, EST AFRICAN AMERICAN: 59 mL/min — AB (ref >60)
Glucose, Bld: 67 mg/dL (ref 65–99)
Potassium: 3.4 mmol/L — ABNORMAL LOW (ref 3.5–5.1)
SODIUM: 135 mmol/L (ref 135–145)

## 2017-07-14 MED ORDER — OXYCODONE-ACETAMINOPHEN 7.5-325 MG PO TABS
1.0000 | ORAL_TABLET | ORAL | Status: DC | PRN
  Administered 2017-07-14 – 2017-07-16 (×10): 1 via ORAL
  Filled 2017-07-14 (×10): qty 1

## 2017-07-14 NOTE — Progress Notes (Signed)
Inpatient Diabetes Program Recommendations  AACE/ADA: New Consensus Statement on Inpatient Glycemic Control (2015)  Target Ranges:  Prepandial:   less than 140 mg/dL      Peak postprandial:   less than 180 mg/dL (1-2 hours)      Critically ill patients:  140 - 180 mg/dL   Results for Lucas Morgan, [name] (MRN [id number]) as of 07/14/2017 10:35  Ref. Range 07/13/2017 07:41 07/13/2017 11:42 07/13/2017 16:19 07/13/2017 21:25 07/14/2017 07:59 07/14/2017 09:02  Glucose-Capillary Latest Ref Range: 65 - 99 mg/dL 77 782138 (H) 956183 (H) 213204 (H) 72 103 (H)   Review of Glycemic Control  Diabetes history: DM2 Outpatient Diabetes medications: Levemir 20 units QHS, Metformin 500 mg BID Current orders for Inpatient glycemic control: Levemir 20 units QHS, Novolog 0-9 units TID with meals  Inpatient Diabetes Program Recommendations:  Insulin - Basal: Fasting glucose 77 mg/dl on 04/6509/31 and 72 mg/dl today. Please consider decreasing Levemir to 18 units QHS.  Thanks, Orlando PennerMarie Feliz Lincoln, RN, MSN, CDE Diabetes Coordinator Inpatient Diabetes Program [phone] (Team Pager from 8am to 5pm)

## 2017-07-14 NOTE — Progress Notes (Signed)
Physical Therapy Treatment Patient Details Name: Lucas Morgan MRN: [id number] DOB: 12/14/1940 Today's Date: 07/14/2017    History of Present Illness Lucas Morgan is a 76 y.o. male with medical history significant of AAA, osteoarthritis, CAD, chronic combined systolic and diastolic CHF, chronic interstitial lung disease, type 2 diabetes, hypertension, osteoporosis, renal insufficiency, history of CVA with residual right-sided hemiparesis who [facility] after the patient became increasingly confused at home for the past 2 days. He was recently admitted and discharged earlier this month due to acute lower UTI. He has an indwelling Foley. No further information available from the patient at this time.    PT Comments    [facility], then able to ambulate in hallway, but limited secondary to c/o fatigue.  Patient tolerated sitting up in chair after therapy - RN aware.  Patient will benefit from continued physical therapy in hospital and recommended venue below to increase strength, balance, endurance for safe ADLs and gait.    Follow Up Recommendations  SNF;Supervision/Assistance - 24 hour     Equipment Recommendations  None recommended by PT    Recommendations for Other Services       Precautions / Restrictions Precautions Precautions: Fall Restrictions Weight Bearing Restrictions: No    Mobility  Bed Mobility Overal bed mobility: Needs Assistance Bed Mobility: Supine to Sit;Sit to Supine     Supine to sit: Min guard Sit to supine: Min guard      Transfers Overall transfer level: Needs assistance Equipment used: Rolling walker (2 wheeled) Transfers: Sit to/from [employer] CorporationStand;Stand Pivot Transfers Sit to Stand: Min guard Stand pivot transfers: Min guard       General transfer comment: required verbal cues and occasional tactile cueing   Ambulation/Gait Ambulation/Gait  assistance: Min assist Ambulation Distance (Feet): 25 Feet Assistive device: Rolling walker (2 wheeled) Gait Pattern/deviations: Decreased step length - right;Decreased step length - left;Decreased stride length Gait velocity: slow Gait velocity interpretation: Below normal speed for age/gender General Gait Details: demonstrates slow labored cadence with much time to make turns, limited secondary to c/o fatigue   Stairs            Wheelchair Mobility    Modified Rankin (Stroke Patients Only)       Balance Overall balance assessment: Needs assistance Sitting-balance support: No upper extremity supported;Feet supported Sitting balance-Leahy Scale: Good     Standing balance support: Bilateral upper extremity supported;During functional activity Standing balance-Leahy Scale: Fair                              Cognition Arousal/Alertness: Awake/alert Behavior During Therapy: WFL for tasks assessed/performed Overall Cognitive Status: Within Functional Limits for tasks assessed                                        Exercises      General Comments        Pertinent Vitals/Pain Pain Assessment: 0-10 Pain Score: 10-Worst pain ever Pain Location: low back, however patient's expression not consistent with 10/10 pain Pain Descriptors / Indicators: Aching    Home Living                      Prior Function  PT Goals (current goals can now be found in the care plan section) Acute Rehab PT Goals Patient Stated Goal: Return home PT Goal Formulation: With patient Time For Goal Achievement: 07/20/17 Potential to Achieve Goals: Good Progress towards PT goals: Progressing toward goals    Frequency    Min 3X/week      PT Plan Current plan remains appropriate    Co-evaluation              AM-PAC PT "6 Clicks" Daily Activity  Outcome Measure  Difficulty turning over in bed (including adjusting bedclothes,  sheets and blankets)?: None Difficulty moving from lying on back to sitting on the side of the bed? : A Little Difficulty sitting down on and standing up from a chair with arms (e.g., wheelchair, bedside commode, etc,.)?: A Little Help needed moving to and from a bed to chair (including a wheelchair)?: A Little Help needed walking in hospital room?: A Little Help needed climbing 3-5 steps with a railing? : A Little 6 Click Score: 19    End of Session Equipment Utilized During Treatment: Gait belt Activity Tolerance: Patient tolerated treatment well;Patient limited by fatigue Patient left: in chair;with call bell/phone within reach Nurse Communication: Mobility status PT Visit Diagnosis: Unsteadiness on feet (R26.81);Other abnormalities of gait and mobility (R26.89);Muscle weakness (generalized) (M62.81)     Time: 1610-9604 PT Time Calculation (min) (ACUTE ONLY): 28 min  Charges:  $Therapeutic Activity: 23-37 mins                    G Codes:  Functional Assessment Tool Used: AM-PAC 6 Clicks Basic Mobility Functional Limitation: Mobility: Walking and moving around Mobility: Walking and Moving Around Current Status (V4098): At least 20 percent but less than 40 percent impaired, limited or restricted Mobility: Walking and Moving Around Goal Status [phone]): At least 20 percent but less than 40 percent impaired, limited or restricted Mobility: Walking and Moving Around Discharge Status [phone]): At least 20 percent but less than 40 percent impaired, limited or restricted    1:37 PM, 07/14/17 Ocie Bob, MPT Physical Therapist with [facility] 336 [phone] office [phone] mobile phone

## 2017-07-14 NOTE — Progress Notes (Signed)
PROGRESS NOTE    Lucas EatonJohn C Boran  ZOX:[id number] DOB: 09/18/1940 DOA: 07/12/2017 PCP: [facility]    Brief Narrative:  Lucas Morgan is a 76 y.o. male with medical history significant of AAA, osteoarthritis, CAD, chronic combined systolic and diastolic CHF, chronic interstitial lung disease, type 2 diabetes, hypertension, osteoporosis, renal insufficiency, history of CVA with residual right-sided hemiparesis who was [facility] after the patient became increasingly confused at home for the past 2 days. He was recently admitted and discharged earlier this month due to acute lower UTI. He has an indwelling Foley. No further information available from the patient at this time.  ED Course: Initial vital signs in the emergency department temperature 97.73F, pulse 89, respirations 18, blood pressure 135/67 with O2 sat of 93% on room air. He was given Rocephin IVPB in the emergency department.  His workup shows an urinalysis with TNTC WBC, positive nitrites, small hemoglobinuria with 6-30 RBC on microscopic, mild proteinuria at 30 mg/dL. WBC of 18.8 with 82% neutrophils, hemoglobin 14.1 g/dL and platelets 811223. Troponin level and BMP were normal. Lactic acid was 2.0. His BUN was 22, creatinine 1.66 and glucose 224 mg/dL, the rest of the CMP was unremarkable.   Assessment & Plan:   Principal Problem:   Sepsis secondary to UTI [facility](HCC) Active Problems:   CAD, NATIVE VESSEL   Type 2 diabetes mellitus with stage 2 chronic kidney disease, with long-term current use of insulin (HCC)   HLD (hyperlipidemia)   Altered mental status   AKI (acute kidney injury) (HCC)   Chronic interstitial lung disease (HCC)   History of CVA (cerebrovascular accident)   Chronic combined systolic and diastolic congestive heart failure (HCC)   Sepsis secondary to UTI (HCC) Will use meropenem per pharmacy due to most recent UC&S growing ESBL  Escherichia coli. Culture growing E.coli and Citrobacter Follow up sensitivities   Chronic Back Pain percocet  Altered mental status Secondary to above. Continue time-limited IV hydration. Continue IV antibiotics. Supportive care.    CAD, NATIVE VESSEL Continue aspirin, clopidogrel and atorvastatin.    Chronic combined systolic and diastolic congestive heart failure (HCC) Compensated at this time BNP is normal at 99 pg/mL Monitor I/O    Chronic interstitial lung disease (HCC) Continue supplemental oxygen. Continue bronchodilators as needed.    Type 2 diabetes mellitus with stage 2 chronic kidney disease,     with long-term current use of insulin (HCC) Carbohydrate modified diet. Continue Levemir 20 units SQ at bedtime. HgA1c 7.4    HLD (hyperlipidemia) Continue atorvastatin 80 mg by mouth daily.    AKI (acute kidney injury) (HCC) Cr improved to 1.32 today    History of CVA (cerebrovascular accident) PT consult placed    Generalized weakness PT consult   DVT prophylaxis: Heparin SQ Code Status: Full Code Family Communication: No family bedside Disposition Plan: pending improvement    Consultants:   None  Procedures:   None  Antimicrobials:   Meropenem   Subjective: Patient awake and watching television.  Reports that he has had some intermittent back pain.  Oriented to place, situation and person.  Objective: Vitals:   07/13/17 2030 07/14/17 0500 07/14/17 1300 07/14/17 1450  BP:  (!) 93/56 116/81 (!) 111/58  Pulse:  83 74 80  Resp:  18 19   Temp:   (!) 97.5 F (36.4 C) (!) 97.5 F (36.4 C)  TempSrc:   Oral Oral  SpO2: 92% 98%  95% 96%  Weight:      Height:        Intake/Output Summary (Last 24 hours) at 07/14/17 1629 Last data filed at 07/14/17 0925  Gross per 24 hour  Intake             1110 ml  Output             1225 ml  Net             -115 ml   Filed Weights   07/12/17 1638  Weight: 68.9 kg (152 lb)     Examination:  General exam: Appears calm and comfortable  Respiratory system: Clear to auscultation. Respiratory effort normal. Cardiovascular system: S1 & S2 heard, RRR. No JVD, murmurs, rubs, gallops or clicks. No pedal edema. Gastrointestinal system: Abdomen is nondistended, soft and nontender. No organomegaly or masses felt. Normal bowel sounds heard. Central nervous system: Alert and oriented. No focal neurological deficits. Extremities: able to move arms and legs spontaneously and independently; ecchymosis on the left midback Skin: No rashes, lesions or ulcers Psychiatry: Oriented to person, situation and place only     Data Reviewed: I have personally reviewed following labs and imaging studies  CBC:  Recent Labs Lab 07/12/17 1657 07/13/17 0447 07/14/17 0433  WBC 18.8* 15.7* 9.8  NEUTROABS 15.5* 11.8* 4.8  HGB 14.1 15.0 13.5  HCT 40.7 43.9 39.7  MCV 90.6 89.6 90.6  PLT 223 241 230   Basic Metabolic Panel:  Recent Labs Lab 07/12/17 1657 07/13/17 0447 07/14/17 0433  NA 137 140 135  K 4.4 3.9 3.4*  CL 101 104 101  CO2 27 27 25   GLUCOSE 224* 90 67  BUN 22* 17 18  CREATININE 1.66* 1.38* 1.32*  CALCIUM 8.9 9.2 8.7*   GFR: Estimated Creatinine Clearance: 47.1 mL/min (A) (by C-G formula based on SCr of 1.32 mg/dL (H)). Liver Function Tests:  Recent Labs Lab 07/12/17 1657  AST 16  ALT 12*  ALKPHOS 94  BILITOT 0.4  PROT 7.5  ALBUMIN 3.5   No results for input(s): LIPASE, AMYLASE in the last 168 hours. No results for input(s): AMMONIA in the last 168 hours. Coagulation Profile: No results for input(s): INR, PROTIME in the last 168 hours. Cardiac Enzymes:  Recent Labs Lab 07/12/17 1657  TROPONINI <0.03   BNP (last 3 results) No results for input(s): PROBNP in the last 8760 hours. HbA1C:  Recent Labs  07/13/17 0447  HGBA1C 7.4*   CBG:  Recent Labs Lab 07/13/17 1619 07/13/17 2125 07/14/17 0759 07/14/17 0902 07/14/17 1203  GLUCAP  183* 204* 72 103* 139*   Lipid Profile: No results for input(s): CHOL, HDL, LDLCALC, TRIG, CHOLHDL, LDLDIRECT in the last 72 hours. Thyroid Function Tests: No results for input(s): TSH, T4TOTAL, FREET4, T3FREE, THYROIDAB in the last 72 hours. Anemia Panel: No results for input(s): VITAMINB12, FOLATE, FERRITIN, TIBC, IRON, RETICCTPCT in the last 72 hours. Sepsis Labs:  Recent Labs Lab 07/12/17 1705  LATICACIDVEN 2.0*    Recent Results (from the past 240 hour(s))  Urine Culture     Status: Abnormal (Preliminary result)   Collection Time: 07/12/17  5:37 PM  Result Value Ref Range Status   Specimen Description URINE, RANDOM  Final   Special Requests NONE  Final   Culture (A)  Final    >=100,000 COLONIES/mL ESCHERICHIA COLI >=100,000 COLONIES/mL CITROBACTER FREUNDII    Report Status PENDING  Incomplete         Radiology Studies: Ct Head Wo  Contrast  Result Date: 07/12/2017 CLINICAL DATA:  Weakness all over, worse than normal. Treated for urinary tract infection. EXAM: CT HEAD WITHOUT CONTRAST TECHNIQUE: Contiguous axial images were obtained from the base of the skull through the vertex without intravenous contrast. COMPARISON:  Most recent CT 06/21/2016. FINDINGS: Brain: No evidence for acute infarction, hemorrhage, mass lesion, hydrocephalus, or extra-axial fluid. Generalized atrophy. Evidence for multiple chronic medium to large vessel infarcts, bihemispheric, clearly worst in the LEFT frontal and posterior frontal region, stable from prior scans. Scattered areas of lacunar infarction, most notable RIGHT caudate. No definite acute cerebral ischemia. Chronic microvascular ischemic change throughout the white matter. Vascular: Calcification of the cavernous internal carotid arteries consistent with cerebrovascular atherosclerotic disease. No signs of intracranial large vessel occlusion. Skull: Normal. Negative for fracture or focal lesion. Sinuses/Orbits: No layering sinus fluid.  Dense lenticular opacity on the RIGHT. Calcified shrunken globe on the LEFT. Other: Mastoids are clear. IMPRESSION: Atrophy and small vessel disease.  Multiple old infarcts. No definite acute cerebral ischemia. No intracranial hemorrhage or shift. Calcified shrunken LEFT globe. Electronically Signed   By: Elsie Stain M.D.   On: 07/12/2017 18:24   Dg Chest Portable 1 View  Result Date: 07/12/2017 CLINICAL DATA:  76 y/o  M; shortness of breath. EXAM: PORTABLE CHEST 1 VIEW COMPARISON:  06/23/2017 chest radiograph. FINDINGS: Stable cardiac silhouette given rotation and technique. Aortic atherosclerosis with calcification. Stable coarse reticular opacities of the lungs. No focal consolidation. No pleural effusion or pneumothorax. IMPRESSION: Stable coarse reticular opacities of the lung which may represent chronic interstitial edema or pneumonitis. No focal consolidation. Electronically Signed   By: Mitzi Hansen M.D.   On: 07/12/2017 17:16        Scheduled Meds: . aspirin  81 mg Oral Daily  . atorvastatin  80 mg Oral q1800  . baclofen  20 mg Oral QHS  . citalopram  10 mg Oral Daily  . clopidogrel  75 mg Oral Daily  . docusate sodium  100 mg Oral BID  . gabapentin  100 mg Oral TID  . heparin  5,000 Units Subcutaneous Q8H  . insulin aspart  0-9 Units Subcutaneous TID WC  . insulin detemir  20 Units Subcutaneous QHS  . ketotifen  1 drop Both Eyes BID  . magnesium oxide  400 mg Oral Daily  . pantoprazole  40 mg Oral Daily  . primidone  50 mg Oral QHS  . senna-docusate  2 tablet Oral Daily   Continuous Infusions: . meropenem (MERREM) IV Stopped (07/14/17 9604)     LOS: 2 days    Time spent: 30 minutes    Katrinka Blazing, MD [facility] Pager [phone]  If 7PM-7AM, please contact night-coverage [email or web address] Password [facility] 07/14/2017, 4:29 PM

## 2017-07-15 LAB — URINE CULTURE

## 2017-07-15 LAB — CBC WITH DIFFERENTIAL/PLATELET
BASOS PCT: 1 %
Basophils Absolute: 0.1 10*3/uL (ref 0.0–0.1)
Eosinophils Absolute: 0.7 10*3/uL (ref 0.0–0.7)
Eosinophils Relative: 8 %
HEMATOCRIT: 38.3 % — AB (ref 39.0–52.0)
HEMOGLOBIN: 12.9 g/dL — AB (ref 13.0–17.0)
Lymphocytes Relative: 34 %
Lymphs Abs: 3 10*3/uL (ref 0.7–4.0)
MCH: 30.4 pg (ref 26.0–34.0)
MCHC: 33.7 g/dL (ref 30.0–36.0)
MCV: 90.3 fL (ref 78.0–100.0)
MONOS PCT: 12 %
Monocytes Absolute: 1 10*3/uL (ref 0.1–1.0)
NEUTROS ABS: 4 10*3/uL (ref 1.7–7.7)
NEUTROS PCT: 45 %
Platelets: 222 10*3/uL (ref 150–400)
RBC: 4.24 MIL/uL (ref 4.22–5.81)
RDW: 13.1 % (ref 11.5–15.5)
WBC: 8.8 10*3/uL (ref 4.0–10.5)

## 2017-07-15 LAB — GLUCOSE, CAPILLARY
GLUCOSE-CAPILLARY: 60 mg/dL — AB (ref 65–99)
GLUCOSE-CAPILLARY: 246 mg/dL — AB (ref 65–99)
Glucose-Capillary: 140 mg/dL — ABNORMAL HIGH (ref 65–99)
Glucose-Capillary: 172 mg/dL — ABNORMAL HIGH (ref 65–99)
Glucose-Capillary: 168 mg/dL — ABNORMAL HIGH (ref 65–99)

## 2017-07-15 LAB — BASIC METABOLIC PANEL
ANION GAP: 7 (ref 5–15)
BUN: 24 mg/dL — ABNORMAL HIGH (ref 6–20)
CALCIUM: 8.5 mg/dL — AB (ref 8.9–10.3)
CHLORIDE: 104 mmol/L (ref 101–111)
CO2: 26 mmol/L (ref 22–32)
Creatinine, Ser: 1.56 mg/dL — ABNORMAL HIGH (ref 0.61–1.24)
GFR calc non Af Amer: 42 mL/min — ABNORMAL LOW (ref >60)
GFR, EST AFRICAN AMERICAN: 48 mL/min — AB (ref >60)
Glucose, Bld: 76 mg/dL (ref 65–99)
Potassium: 3.8 mmol/L (ref 3.5–5.1)
SODIUM: 137 mmol/L (ref 135–145)

## 2017-07-15 MED ORDER — DEXTROSE 50 % IV SOLN
INTRAVENOUS | Status: AC
  Administered 2017-07-15: 50 mL
  Filled 2017-07-15: qty 50

## 2017-07-15 NOTE — Care Management Note (Signed)
Case Management Note  Patient Details  Name: Krystal EatonJohn C Macmurray MRN: [id number] Date of Birth: 07/20/1941    Expected Discharge Date:                  Expected Discharge Plan:  Skilled Nursing Facility  In-House Referral:  Clinical Social Work  Discharge planning Services  CM Consult  Post Acute Care Choice:  NA Choice offered to:  Patient, Adult Children  DME Arranged:    DME Agency:     HH Arranged:    HH Agency:     Status of Service:  Completed, signed off  If discussed at [employer] Meetings, dates discussed:    Additional Comments: Per attending, family may be interested in hospice services. Went to talk with family in regards to what hospice services would entail. Once family realized hospice services would not mean someone would be with him 24/7 they are no longer interested. CSW working on SNF placement. No other CM needs.   Fleda Pagel, Chrystine OilerSharley Diane, RN 07/15/2017, 1:22 PM

## 2017-07-15 NOTE — Progress Notes (Signed)
Pharmacy Antibiotic Note  Lucas EatonJohn C Bernard is a 76 y.o. male admitted on 07/12/2017 with UTI.  Pharmacy has been consulted for Meropenem dosing. He has ESBL E coli and citrobacter in UC  Plan: Continue Meropenem 1gm IV every 12 hours. Monitor labs, micro and vitals.   Height: 5\' 9"  (175.3 cm) Weight: 152 lb (68.9 kg) IBW/kg (Calculated) : 70.7  Temp (24hrs), Avg:97.6 F (36.4 C), Min:97.4 F (36.3 C), Max:97.9 F (36.6 C)   Recent Labs Lab 07/12/17 1657 07/12/17 1705 07/13/17 0447 07/14/17 0433 07/15/17 0511  WBC 18.8*  --  15.7* 9.8 8.8  CREATININE 1.66*  --  1.38* 1.32* 1.56*  LATICACIDVEN  --  2.0*  --   --   --     Estimated Creatinine Clearance: 39.9 mL/min (A) (by C-G formula based on SCr of 1.56 mg/dL (H)).    Allergies  Allergen Reactions  . Trazodone And Nefazodone Other (See Comments)    High blood sugar    Antimicrobials this admission: Meropenem 10/30 >>     Dose adjustments this admission: n/a   Microbiology results:  BCx:  10/30 UCx: ESBL E coli, Citrobacter 10/12 UCx: ESBL E.Coli   Sputum:    MRSA PCR:   Thank you for allowing pharmacy to be a part of this patient's care.  Talbert CageSeay, Shawndell Varas Poteet 07/15/2017 10:16 AM

## 2017-07-15 NOTE — Progress Notes (Signed)
PROGRESS NOTE    Lucas EatonJohn C Tuckett  ZOX:[id number] DOB: 07/01/1941 DOA: 07/12/2017 PCP: [facility]    Brief Narrative:  Lucas Morgan is a 76 y.o. male with medical history significant of AAA, osteoarthritis, CAD, chronic combined systolic and diastolic CHF, chronic interstitial lung disease, type 2 diabetes, hypertension, osteoporosis, renal insufficiency, history of CVA with residual right-sided hemiparesis who was [facility] to the emergency department [facility] after the patient became increasingly confused at home for the past 2 days. He was recently admitted and discharged earlier this month due to acute lower UTI. He has an indwelling Foley. No further information available from the patient at this time.  ED Course: Initial vital signs in the emergency department temperature 97.70F, pulse 89, respirations 18, blood pressure 135/67 with O2 sat of 93% on room air. He was given Rocephin IVPB in the emergency department.  His workup shows an urinalysis with TNTC WBC, positive nitrites, small hemoglobinuria with 6-30 RBC on microscopic, mild proteinuria at 30 mg/dL. WBC of 18.8 with 82% neutrophils, hemoglobin 14.1 g/dL and platelets [id number]. Troponin level and BMP were normal. Lactic acid was 2.0. His BUN was 22, creatinine 1.66 and glucose 224 mg/dL, the rest of the CMP was unremarkable.   Assessment & Plan:   Principal Problem:   Sepsis secondary to UTI [facility](HCC) Active Problems:   CAD, NATIVE VESSEL   Type 2 diabetes mellitus with stage 2 chronic kidney disease, with long-term current use of insulin (HCC)   HLD (hyperlipidemia)   Altered mental status   AKI (acute kidney injury) (HCC)   Chronic interstitial lung disease (HCC)   History of CVA (cerebrovascular accident)   Chronic combined systolic and diastolic congestive heart failure (HCC)   Sepsis secondary to UTI (HCC) Continue meropenem per pharmacy due to most recent UC&S growing ESBL  Escherichia coli. Culture growing E.coli and Citrobacter Sensitivities showing sensitive to meropenem and nitrofurantoin  Chronic Back Pain percocet  Altered mental status Appears to be at baseline per children. Continue IV antibiotics. Supportive care.    CAD, NATIVE VESSEL Continue aspirin, clopidogrel and atorvastatin.    Chronic combined systolic and diastolic congestive heart failure (HCC) Compensated at this time BNP is normal at 99 pg/mL Monitor I/O Stop IVF    Chronic interstitial lung disease (HCC) Continue supplemental oxygen. Continue bronchodilators as needed.    Type 2 diabetes mellitus with stage 2 chronic kidney disease,     with long-term current use of insulin (HCC) Carbohydrate modified diet. Continue Levemir 20 units SQ at bedtime. HgA1c 7.4    HLD (hyperlipidemia) Continue atorvastatin 80 mg by mouth daily.    AKI (acute kidney injury) (HCC) Cr at 1.56 Monitor urine output and sparingly use IVF given h/o CHF    History of CVA (cerebrovascular accident) PT consult placed SW working on SNF placement    Generalized weakness PT consult   DVT prophylaxis: Heparin SQ Code Status: Full Code Family Communication: son is bedside Disposition Plan: pending improvement, will likely need SNF at discharge    Consultants:   PT  SW  CM  Procedures:   None  Antimicrobials:   Meropenem   Subjective: Patient asleep at time of evaluation.  He awoke to name.  Son is bedside.  Patient reports he feels better.  Only complains of back pain when specifically asked.  Objective: Vitals:   07/14/17 1450 07/14/17 1954 07/14/17 2102 07/15/17 0537  BP: (!) 111/58  113/68 (!) 91/4792/58  Pulse: 80  79 78  Resp:   18 18  Temp: (!) 97.5 F (36.4 C)  97.9 F (36.6 C) (!) 97.4 F (36.3 C)  TempSrc: Oral  Oral Oral  SpO2: 96% 95% 97% 97%  Weight:      Height:        Intake/Output Summary (Last 24 hours) at 07/15/17 1549 Last data filed at  07/15/17 0535  Gross per 24 hour  Intake              360 ml  Output             1250 ml  Net             -890 ml   Filed Weights   07/12/17 1638  Weight: 68.9 kg (152 lb)    Examination:  General exam: Appears calm and comfortable  Respiratory system: Clear to auscultation. Respiratory effort normal. Cardiovascular system: S1 & S2 heard, RRR. No JVD, murmurs, rubs, gallops or clicks. No pedal edema. Gastrointestinal system: Abdomen is nondistended, soft and nontender. No organomegaly or masses felt. Normal bowel sounds heard. Central nervous system: Alert and oriented to person, place, situation. No focal neurological deficits. Extremities: able to move arms and legs spontaneously and independently; ecchymosis on the left midback Skin: No rashes, lesions or ulcers Psychiatry: Oriented to person, situation and place only     Data Reviewed: I have personally reviewed following labs and imaging studies  CBC:  Recent Labs Lab 07/12/17 1657 07/13/17 0447 07/14/17 0433 07/15/17 0511  WBC 18.8* 15.7* 9.8 8.8  NEUTROABS 15.5* 11.8* 4.8 4.0  HGB 14.1 15.0 13.5 12.9*  HCT 40.7 43.9 39.7 38.3*  MCV 90.6 89.6 90.6 90.3  PLT 223 241 230 222   Basic Metabolic Panel:  Recent Labs Lab 07/12/17 1657 07/13/17 0447 07/14/17 0433 07/15/17 0511  NA 137 140 135 137  K 4.4 3.9 3.4* 3.8  CL 101 104 101 104  CO2 27 27 25 26   GLUCOSE 224* 90 67 76  BUN 22* 17 18 24*  CREATININE 1.66* 1.38* 1.32* 1.56*  CALCIUM 8.9 9.2 8.7* 8.5*   GFR: Estimated Creatinine Clearance: 39.9 mL/min (A) (by C-G formula based on SCr of 1.56 mg/dL (H)). Liver Function Tests:  Recent Labs Lab 07/12/17 1657  AST 16  ALT 12*  ALKPHOS 94  BILITOT 0.4  PROT 7.5  ALBUMIN 3.5   No results for input(s): LIPASE, AMYLASE in the last 168 hours. No results for input(s): AMMONIA in the last 168 hours. Coagulation Profile: No results for input(s): INR, PROTIME in the last 168 hours. Cardiac  Enzymes:  Recent Labs Lab 07/12/17 1657  TROPONINI <0.03   BNP (last 3 results) No results for input(s): PROBNP in the last 8760 hours. HbA1C:  Recent Labs  07/13/17 0447  HGBA1C 7.4*   CBG:  Recent Labs Lab 07/14/17 1644 07/14/17 2107 07/15/17 0751 07/15/17 0900 07/15/17 1152  GLUCAP 160* 201* 60* 246* 140*   Lipid Profile: No results for input(s): CHOL, HDL, LDLCALC, TRIG, CHOLHDL, LDLDIRECT in the last 72 hours. Thyroid Function Tests: No results for input(s): TSH, T4TOTAL, FREET4, T3FREE, THYROIDAB in the last 72 hours. Anemia Panel: No results for input(s): VITAMINB12, FOLATE, FERRITIN, TIBC, IRON, RETICCTPCT in the last 72 hours. Sepsis Labs:  Recent Labs Lab 07/12/17 1705  LATICACIDVEN 2.0*    Recent Results (from the past 240 hour(s))  Urine Culture     Status: Abnormal   Collection Time: 07/12/17  5:37 PM  Result Value Ref Range Status   Specimen Description URINE, RANDOM  Final   Special Requests NONE  Final   Culture (A)  Final    >=100,000 COLONIES/mL ESCHERICHIA COLI >=100,000 COLONIES/mL CITROBACTER FREUNDII    Report Status 07/15/2017 FINAL  Final   Organism ID, Bacteria ESCHERICHIA COLI (A)  Final   Organism ID, Bacteria CITROBACTER FREUNDII (A)  Final      Susceptibility   Citrobacter freundii - MIC*    CEFAZOLIN >=64 RESISTANT Resistant     CEFTRIAXONE >=64 RESISTANT Resistant     CIPROFLOXACIN 1 SENSITIVE Sensitive     GENTAMICIN <=1 SENSITIVE Sensitive     IMIPENEM 1 SENSITIVE Sensitive     NITROFURANTOIN <=16 SENSITIVE Sensitive     TRIMETH/SULFA >=320 RESISTANT Resistant     PIP/TAZO 32 INTERMEDIATE Intermediate     * >=100,000 COLONIES/mL CITROBACTER FREUNDII   Escherichia coli - MIC*    AMPICILLIN >=32 RESISTANT Resistant     CEFAZOLIN >=64 RESISTANT Resistant     CEFTRIAXONE >=64 RESISTANT Resistant     CIPROFLOXACIN >=4 RESISTANT Resistant     GENTAMICIN <=1 SENSITIVE Sensitive     IMIPENEM <=0.25 SENSITIVE Sensitive      NITROFURANTOIN <=16 SENSITIVE Sensitive     TRIMETH/SULFA <=20 SENSITIVE Sensitive     AMPICILLIN/SULBACTAM >=32 RESISTANT Resistant     PIP/TAZO 64 INTERMEDIATE Intermediate     Extended ESBL POSITIVE Resistant     * >=100,000 COLONIES/mL ESCHERICHIA COLI         Radiology Studies: No results found.      Scheduled Meds: . aspirin  81 mg Oral Daily  . atorvastatin  80 mg Oral q1800  . baclofen  20 mg Oral QHS  . citalopram  10 mg Oral Daily  . clopidogrel  75 mg Oral Daily  . docusate sodium  100 mg Oral BID  . heparin  5,000 Units Subcutaneous Q8H  . insulin aspart  0-9 Units Subcutaneous TID WC  . insulin detemir  20 Units Subcutaneous QHS  . ketotifen  1 drop Both Eyes BID  . magnesium oxide  400 mg Oral Daily  . pantoprazole  40 mg Oral Daily  . primidone  50 mg Oral QHS  . senna-docusate  2 tablet Oral Daily   Continuous Infusions: . meropenem (MERREM) IV Stopped (07/15/17 1049)     LOS: 3 days    Time spent: 30 minutes    Katrinka Blazing, MD [facility] Pager [phone]  If 7PM-7AM, please contact night-coverage [email or web address] Password [facility] 07/15/2017, 3:49 PM

## 2017-07-15 NOTE — Progress Notes (Signed)
Physical Therapy Treatment Patient Details Name: Lucas EatonJohn C Didonato MRN: [id number] DOB: 07/25/1941 Today's Date: 07/15/2017    History of Present Illness Lucas Morgan is a 76 y.o. male with medical history significant of AAA, osteoarthritis, CAD, chronic combined systolic and diastolic CHF, chronic interstitial lung disease, type 2 diabetes, hypertension, osteoporosis, renal insufficiency, history of CVA with residual right-sided hemiparesis who [facility] after the patient became increasingly confused at home for the past 2 days. He was recently admitted and discharged earlier this month due to acute lower UTI. He has an indwelling Foley. No further information available from the patient at this time.    PT Comments    Patient demonstrates increased tolerance for gait training without loss of balance, limited secondary to c/o fatigue.  Patient will benefit from continued physical therapy in hospital and recommended venue below to increase strength, balance, endurance for safe ADLs and gait.   Follow Up Recommendations  SNF;Supervision/Assistance - 24 hour     Equipment Recommendations  None recommended by PT    Recommendations for Other Services       Precautions / Restrictions Precautions Precautions: Fall Restrictions Weight Bearing Restrictions: No    Mobility  Bed Mobility Overal bed mobility: Needs Assistance Bed Mobility: Supine to Sit;Sit to Supine     Supine to sit: Supervision Sit to supine: Supervision      Transfers Overall transfer level: Needs assistance Equipment used: Rolling walker (2 wheeled) Transfers: Sit to/from Stand Sit to Stand: Min guard            Ambulation/Gait Ambulation/Gait assistance: Min guard Ambulation Distance (Feet): 45 Feet Assistive device: Rolling walker (2 wheeled) Gait Pattern/deviations: Decreased step length - right;Decreased step length - left;Decreased stride  length Gait velocity: slow Gait velocity interpretation: Below normal speed for age/gender General Gait Details: demonstrates increased endurance with slightly labored slow cadence, no loss of balance, limited secondary to c/o fatigue   Stairs            Wheelchair Mobility    Modified Rankin (Stroke Patients Only)       Balance Overall balance assessment: Needs assistance Sitting-balance support: No upper extremity supported;Feet supported Sitting balance-Leahy Scale: Good     Standing balance support: Bilateral upper extremity supported;During functional activity Standing balance-Leahy Scale: Fair                              Cognition Arousal/Alertness: Awake/alert Behavior During Therapy: WFL for tasks assessed/performed Overall Cognitive Status: Within Functional Limits for tasks assessed                                        Exercises General Exercises - Lower Extremity Ankle Circles/Pumps: Seated;AROM;Strengthening;Both;10 reps Long Arc Quad: Seated;AROM;Strengthening;Both;10 reps Hip Flexion/Marching: Seated;AROM;Strengthening;Both;5 reps    General Comments        Pertinent Vitals/Pain Pain Assessment: Faces Faces Pain Scale: Hurts a little bit Pain Location: low back Pain Descriptors / Indicators: Aching    Home Living                      Prior Function            PT Goals (current goals can now be found in the care plan section) Acute Rehab PT Goals Patient Stated Goal:  Return home PT Goal Formulation: With patient Time For Goal Achievement: 07/20/17 Potential to Achieve Goals: Good Progress towards PT goals: Progressing toward goals    Frequency    Min 3X/week      PT Plan Current plan remains appropriate    Co-evaluation              AM-PAC PT "6 Clicks" Daily Activity  Outcome Measure  Difficulty turning over in bed (including adjusting bedclothes, sheets and blankets)?:  None Difficulty moving from lying on back to sitting on the side of the bed? : None Difficulty sitting down on and standing up from a chair with arms (e.g., wheelchair, bedside commode, etc,.)?: A Little Help needed moving to and from a bed to chair (including a wheelchair)?: A Little Help needed walking in hospital room?: A Little Help needed climbing 3-5 steps with a railing? : A Little 6 Click Score: 20    End of Session Equipment Utilized During Treatment: Gait belt Activity Tolerance: Patient tolerated treatment well;Patient limited by fatigue Patient left: with call bell/phone within reach;with bed alarm set;in bed Nurse Communication: Mobility status PT Visit Diagnosis: Unsteadiness on feet (R26.81);Other abnormalities of gait and mobility (R26.89);Muscle weakness (generalized) (M62.81)     Time: 1610-9604 PT Time Calculation (min) (ACUTE ONLY): 25 min  Charges:  $Therapeutic Activity: 23-37 mins                    G Codes:       10:19 AM, 07-29-2017 Ocie Bob, MPT Physical Therapist with [facility] [phone] office [phone] mobile phone

## 2017-07-15 NOTE — Clinical Social Work Note (Signed)
Clinical Social Work Assessment  Patient Details  Name: Lucas EatonJohn C Morgan MRN: [id number] Date of Birth: 02/20/1941  Date of referral:  07/15/17               Reason for consult:  Facility Placement                Permission sought to share information with:    Permission granted to share information::     Name::        Agency::     Relationship::     Contact Information:  Lucas Morgan, daughter  Housing/Transportation Living arrangements for the past 2 months:  Single Family Home Source of Information:  Patient, Adult Children Patient Interpreter Needed:  None Criminal Activity/Legal Involvement Pertinent to Current Situation/Hospitalization:  No - Comment as needed Significant Relationships:  Adult Children Lives with:  Adult Children Do you feel safe going back to the place where you live?  Yes Need for family participation in patient care:  Yes (Comment)  Care giving concerns:  No concerns identified.    Social Worker assessment / plan:  Patient resides in the home with his daughter, Lucas Morgan. Patient ambulates with a walker and Lucas Morgan assists with ADLs.  Patient and Lucas Morgan are agreeable to SNF.   Employment status:  Retired Database administratornsurance information:  Managed Medicare PT Recommendations:  Skilled Nursing Facility Information / Referral to community resources:  Skilled Nursing Facility  Patient/Family's Response to care:  Patient and family are agreeable to SNF.   Patient/Family's Understanding of and Emotional Response to Diagnosis, Current Treatment, and Prognosis:  Patient and family understand patient's diagnosis, treatment and prognosis.   Emotional Assessment Appearance:  Appears stated age Attitude/Demeanor/Rapport:    Affect (typically observed):  Accepting Orientation:  Oriented to Self, Oriented to Place, Oriented to  Time, Oriented to Situation Alcohol / Substance use:  Not Applicable Psych involvement (Current and /or in the community):  No (Comment)  Discharge Needs   Concerns to be addressed:  Discharge Planning Concerns Readmission within the last 30 days:  Yes Current discharge risk:  None Barriers to Discharge:  No Barriers Identified   Lucas Morgan, Lucas Abdulaziz D, LCSW 07/15/2017, 10:26 AM

## 2017-07-15 NOTE — Progress Notes (Signed)
CRITICAL VALUE ALERT  Critical Value:  CBG 60  Date & Time Notied:  07/15/2017 @0825   Provider Notified: Dr Rinaldo RatelKadolph  Orders Received/Actions taken: yes

## 2017-07-15 NOTE — Care Management Important Message (Signed)
Important Message  Patient Details  Name: Lucas EatonJohn C Morgan MRN: [id number] Date of Birth: 08/17/1941   Medicare Important Message Given:  Yes    Marshae Azam, Chrystine OilerSharley Diane, RN 07/15/2017, 1:29 PM

## 2017-07-15 NOTE — [place] FL2 (Signed)
[place] MEDICAID FL2 LEVEL OF CARE SCREENING TOOL     IDENTIFICATION  Patient Name: Lucas EatonJohn C Artus Birthdate: 05/20/1941 Sex: male Admission Date (Current Location): 07/12/2017  [place]   Facility and Address:  [facility],  [address], [place] [id number]      Provider Number: [id number]  Attending Physician Name and Address:  Filbert SchilderKadolph, [place] U, MD  Relative Name and Phone Number:       Current Level of Care: Hospital Recommended Level of Care: Skilled Nursing Facility Prior Approval Number:    Date Approved/Denied:   PASRR Number: [id number] ([id number])  Discharge Plan: SNF    Current Diagnoses: Patient Active Problem List   Diagnosis Date Noted  . Chronic combined systolic and diastolic congestive heart failure (HCC) 07/13/2017  . Sepsis secondary to UTI (HCC) 07/12/2017  . UTI (urinary tract infection) 06/24/2017  . History of CVA (cerebrovascular accident) 06/24/2017  . Acute lower UTI 06/23/2017  . Urinary retention 06/25/2016  . Chronic interstitial lung disease (HCC) 06/24/2016  . Acute encephalopathy 06/23/2016  . Hypokalemia 06/23/2016  . AKI (acute kidney injury) (HCC)   . Altered mental status 06/21/2016  . Hypomagnesemia 06/21/2016  . Elevated troponin 06/21/2016  . Left leg weakness   . Acute CVA (cerebrovascular accident) (HCC) 01/14/2016  . Fall at home 01/13/2016  . Ataxia 01/13/2016  . Orthostatic hypotension 01/13/2016  . Hip pain, acute   . 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 Tremor of both hands 11/18/2015  . HLD (hyperlipidemia) 11/18/2015  . Type 2 diabetes mellitus with stage 2 chronic kidney disease, with long-term current use of insulin (HCC) 08/15/2015  . S/P carotid endarterectomy 08/15/2015  . Acute on chronic systolic congestive heart failure (HCC) 08/15/2015  . Cardiomyopathy, ischemic 06/18/2015  . Cerebral infarction due to  unspecified mechanism   . Chronic pain syndrome 06/11/2015  . Adjustment disorder with anxious mood 06/11/2015  . Aphasia S/P CVA 05/28/2015  . Right hemiparesis (HCC) 05/28/2015  . Stroke, acute, embolic (HCC) 05/27/2015  . Carotid stenosis   . Acute on chronic renal failure (HCC) 05/24/2015  . CVA (cerebral infarction) 05/24/2015  . Leukocytosis 05/24/2015  . Foreign body in colon 04/08/2015  . Spinal stenosis of lumbar region 10/18/2014  . Lumbar stenosis with neurogenic claudication 10/18/2014  . Thyroid nodule 03/12/2013  . AAA (abdominal aortic aneurysm) (HCC) 03/12/2013  . PULMONARY NODULE 08/14/2010  . COUGH 06/30/2010  . HYPOTENSION 05/04/2010  . AODM 01/27/2009  . CAD, NATIVE VESSEL 12/26/2008    Orientation RESPIRATION BLADDER Height & Weight     Self, Time, Situation, Place  Normal Continent Weight: 152 lb (68.9 kg) Height:  5\' 9"  (175.3 cm)  BEHAVIORAL SYMPTOMS/MOOD NEUROLOGICAL BOWEL NUTRITION STATUS      Continent Diet (Heart Healthy/carb modified)  AMBULATORY STATUS COMMUNICATION OF NEEDS Skin   Limited Assist Verbally Normal                       Personal Care Assistance Level of Assistance  Bathing, Feeding, Dressing Bathing Assistance: Limited assistance Feeding assistance: Independent Dressing Assistance: Limited assistance     Functional Limitations Info  Sight, Hearing, Speech Sight Info: Adequate Hearing Info: Adequate Speech Info: Adequate    SPECIAL CARE FACTORS FREQUENCY  PT (By licensed PT)     PT Frequency: 5x/week              Contractures Contractures Info:  Not present    Additional Factors Info  Isolation Precautions Code Status Info: Full Code Allergies Info: Trazodone and Nefazodone Psychotropic Info: Xanax   Isolation Precautions Info: 06/23/17 E coli ESBL in urine. Contact precautions indicated for all admissions     Current Medications (07/15/2017):  This is the current hospital active medication list Current  Facility-Administered Medications  Medication Dose Route Frequency Provider Last Rate Last Dose  . ALPRAZolam Prudy Feeler) tablet 0.25 mg  0.25 mg Oral TID PRN Bobette Mo, MD   0.25 mg at 07/14/17 0309  . aspirin chewable tablet 81 mg  81 mg Oral Daily Bobette Mo, MD   81 mg at 07/15/17 1020  . atorvastatin (LIPITOR) tablet 80 mg  80 mg Oral q1800 Bobette Mo, MD   80 mg at 07/14/17 1716  . baclofen (LIORESAL) tablet 20 mg  20 mg Oral QHS Bobette Mo, MD   20 mg at 07/14/17 2211  . citalopram (CELEXA) tablet 10 mg  10 mg Oral Daily Bobette Mo, MD   10 mg at 07/15/17 1020  . clopidogrel (PLAVIX) tablet 75 mg  75 mg Oral Daily Bobette Mo, MD   75 mg at 07/15/17 1020  . docusate sodium (COLACE) capsule 100 mg  100 mg Oral BID Bobette Mo, MD   100 mg at 07/15/17 1020  . heparin injection 5,000 Units  5,000 Units Subcutaneous Q8H Bobette Mo, MD   5,000 Units at 07/15/17 [phone]  . insulin aspart (novoLOG) injection 0-9 Units  0-9 Units Subcutaneous TID WC Bobette Mo, MD   2 Units at 07/14/17 1716  . insulin detemir (LEVEMIR) injection 20 Units  20 Units Subcutaneous QHS Bobette Mo, MD   20 Units at 07/14/17 2209  . ipratropium-albuterol (DUONEB) 0.5-2.5 (3) MG/3ML nebulizer solution 3 mL  3 mL Nebulization Q6H PRN Bobette Mo, MD      . ketotifen (ZADITOR) 0.025 % ophthalmic solution 1 drop  1 drop Both Eyes BID Bobette Mo, MD   1 drop at 07/15/17 1021  . magnesium oxide (MAG-OX) tablet 400 mg  400 mg Oral Daily Bobette Mo, MD   400 mg at 07/15/17 1020  . meropenem (MERREM) 1 g in sodium chloride 0.9 % 100 mL IVPB  1 g Intravenous Q12H Bobette Mo, MD 200 mL/hr at 07/15/17 1019 1 g at 07/15/17 1019  . ondansetron (ZOFRAN) tablet 4 mg  4 mg Oral Q6H PRN Bobette Mo, MD       Or  . ondansetron [facility]) injection 4 mg  4 mg Intravenous Q6H PRN Bobette Mo, MD      .  oxyCODONE-acetaminophen (PERCOCET) 7.5-325 MG per tablet 1 tablet  1 tablet Oral Q4H PRN Filbert Schilder, MD   1 tablet at 07/15/17 0507  . pantoprazole (PROTONIX) EC tablet 40 mg  40 mg Oral Daily Bobette Mo, MD   40 mg at 07/15/17 1020  . primidone (MYSOLINE) tablet 50 mg  50 mg Oral QHS Bobette Mo, MD   50 mg at 07/14/17 2210  . senna-docusate (Senokot-S) tablet 2 tablet  2 tablet Oral Daily Bobette Mo, MD   2 tablet at 07/15/17 1020     Discharge Medications: Please see discharge summary for a list of discharge medications.  Relevant Imaging Results:  Relevant Lab Results:   Additional Information SS#: [id number]  Annice Needy, LCSW

## 2017-07-16 LAB — GLUCOSE, CAPILLARY
GLUCOSE-CAPILLARY: 118 mg/dL — AB (ref 65–99)
GLUCOSE-CAPILLARY: 231 mg/dL — AB (ref 65–99)
Glucose-Capillary: 156 mg/dL — ABNORMAL HIGH (ref 65–99)

## 2017-07-16 MED ORDER — ALPRAZOLAM 0.25 MG PO TABS: 0.2500 mg | ORAL_TABLET | Freq: Every day | ORAL | 0 refills | Status: AC

## 2017-07-16 MED ORDER — SIMVASTATIN 10 MG PO TABS: 10.0000 mg | ORAL_TABLET | Freq: Every day | ORAL | 0 refills | Status: DC

## 2017-07-16 MED ORDER — METFORMIN HCL 500 MG PO TABS: 500.0000 mg | ORAL_TABLET | Freq: Two times a day (BID) | ORAL | Status: AC

## 2017-07-16 MED ORDER — OXYCODONE-ACETAMINOPHEN 10-325 MG PO TABS: 1.0000 | ORAL_TABLET | Freq: Four times a day (QID) | ORAL | 0 refills | Status: AC | PRN

## 2017-07-16 MED ORDER — FOSFOMYCIN TROMETHAMINE 3 G PO PACK
3.0000 g | PACK | Freq: Once | ORAL | Status: AC
  Administered 2017-07-16: 3 g via ORAL
  Filled 2017-07-16: qty 3

## 2017-07-16 NOTE — Discharge Summary (Signed)
Physician Discharge Summary  Lucas Morgan:[id number] DOB: 1941/07/12 DOA: 07/12/2017  PCP: [facility]  Admit date: 07/12/2017 Discharge date: 07/16/2017  Admitted From: Home  Disposition:  SNF- [facility]  Recommendations for Outpatient Follow-up:  1. Follow up with PCP in 1-2 weeks 2. Follow up with urology concerning repeated urine infections 3. Monitor for signs and symptoms of infection 4. Please obtain BMP/CBC in one week   Home Health: No Equipment/Devices: Foley  Discharge Condition: Stable CODE STATUS: Full Code  Diet recommendation: Heart Healthy / Carb Modified  Brief/Interim Summary: Lucas Morgan a 76 y.o.malewith medical history significant of AAA, osteoarthritis, CAD, chronic combined systolic and diastolic CHF, chronic interstitial lung disease, type 2 diabetes, hypertension, osteoporosis, renal insufficiency, history of CVA with residual right-sided hemiparesis who was brought to the emergency department via [facility] after the patient became increasingly confused at home for the past 2 days. He was recently admitted and discharged earlier this month due to acute lower UTI. He has an indwelling Foley. No further information available from the patient at this time.  ED Course:Initial vital signs in the emergency department temperature 97.38F, pulse 89, respirations 18, blood pressure 135/67 with O2 sat of 93% on room air. He was given Rocephin IVPB in the emergency department.  His workup shows an urinalysis with TNTC WBC, positive nitrites, small hemoglobinuria with 6-30 RBC on microscopic, mild proteinuria at 30 mg/dL. WBC of 18.8 with 82% neutrophils, hemoglobin 14.1 g/dL and platelets 811. Troponin level and BMP were normal. Lactic acid was 2.0. His BUN was 22, creatinine 1.66 and glucose 224 mg/dL, the rest of the CMP was unremarkable.  Patient was admitted and started on Meropenem 2/2 recent ESBL urine culture.   His confusion improved on antibiotics but he was seen by PT and was found to be a candidate for SNF.  Patient was given 1 dose of Fosfomycin on day of discharge and he was discharged to the [facility].  Family encouraged to have patient follow up with urology.  Discharge Diagnoses:  Principal Problem:   Sepsis secondary to UTI [facility]) Active Problems:   CAD, NATIVE VESSEL   Type 2 diabetes mellitus with stage 2 chronic kidney disease, with long-term current use of insulin (HCC)   HLD (hyperlipidemia)   Altered mental status   AKI (acute kidney injury) (HCC)   Chronic interstitial lung disease (HCC)   History of CVA (cerebrovascular accident)   Chronic combined systolic and diastolic congestive heart failure [facility])    Discharge Instructions  Discharge Instructions    Call MD for:  difficulty breathing, headache or visual disturbances    Complete by:  As directed    Call MD for:  extreme fatigue    Complete by:  As directed    Call MD for:  hives    Complete by:  As directed    Call MD for:  persistant dizziness or light-headedness    Complete by:  As directed    Call MD for:  persistant nausea and vomiting    Complete by:  As directed    Call MD for:  redness, tenderness, or signs of infection (pain, swelling, redness, odor or green/yellow discharge around incision site)    Complete by:  As directed    Call MD for:  severe uncontrolled pain    Complete by:  As directed    Call MD for:  temperature >100.4    Complete by:  As directed  Diet - low sodium heart healthy    Complete by:  As directed    Diet Carb Modified    Complete by:  As directed    Discharge instructions    Complete by:  As directed    3 more days of antibiotic Get BMP in 2 days- if Cr improved restart Metformin Follow up with Urology   Increase activity slowly    Complete by:  As directed    Reason for NOT prescribing anticoagulant therapy at discharge    Complete by:  As directed    Reason for not  prescribing antIcoagulant at discharge?:  Medical Contraindication   Comment:  numerous falls     Allergies as of 07/16/2017      Reactions   Trazodone And Nefazodone Other (See Comments)   High blood sugar      Medication List    TAKE these medications   albuterol 108 (90 Base) MCG/ACT inhaler Commonly known as:  PROVENTIL HFA;VENTOLIN HFA Inhale 2 puffs into the lungs every 6 (six) hours as needed for wheezing or shortness of breath.   ALPRAZolam 0.25 MG tablet Commonly known as:  XANAX Take 1 tablet (0.25 mg total) by mouth at bedtime.   aspirin 81 MG tablet Take 1 tablet (81 mg total) by mouth daily.   atorvastatin 80 MG tablet Commonly known as:  LIPITOR Take 1 tablet (80 mg total) by mouth daily.   baclofen 20 MG tablet Commonly known as:  LIORESAL Take 20 mg by mouth at bedtime.   clopidogrel 75 MG tablet Commonly known as:  PLAVIX Take 1 tablet (75 mg total) by mouth daily.   docusate sodium 100 MG capsule Commonly known as:  COLACE Take 100 mg by mouth 2 (two) times daily.   escitalopram 5 MG tablet Commonly known as:  LEXAPRO Take 5 mg by mouth daily.   gabapentin 100 MG capsule Commonly known as:  NEURONTIN TAKE 1 CAPSULE (100 MG) BY MOUTH THREE TIMES DAILY   insulin detemir 100 UNIT/ML injection Commonly known as:  LEVEMIR Inject 20 Units into the skin at bedtime.   metFORMIN 500 MG tablet Commonly known as:  GLUCOPHAGE Take 1 tablet (500 mg total) by mouth 2 (two) times daily with a meal. Start taking on:  07/18/2017 What changed:  These instructions start on 07/18/2017. If you are unsure what to do until then, ask your doctor or other care provider.   oxyCODONE-acetaminophen 10-325 MG tablet Commonly known as:  PERCOCET Take 1 tablet by mouth every 6 (six) hours as needed for pain. What changed:  See the new instructions.   pantoprazole 40 MG tablet Commonly known as:  PROTONIX Take 40 mg by mouth daily.   primidone 50 MG tablet Commonly  known as:  MYSOLINE Take 50 mg by mouth at bedtime.   senna-docusate 8.6-50 MG tablet Commonly known as:  Senokot-S Take 2 tablets by mouth daily.   simvastatin 10 MG tablet Commonly known as:  ZOCOR Take 1 tablet (10 mg total) by mouth daily at 6 PM.       Allergies  Allergen Reactions  . Trazodone And Nefazodone Other (See Comments)    High blood sugar    Consultations:  None   Procedures/Studies: Dg Chest 2 View  Result Date: 06/23/2017 CLINICAL DATA:  Dyspnea and productive cough.  Fever for 2 days. EXAM: CHEST  2 VIEW COMPARISON:  06/23/2016 FINDINGS: Diffuse interstitial coarsening, unchanged and likely fibrotic. No consolidation. No effusions. Hilar, mediastinal and cardiac  contours are unremarkable and unchanged. IMPRESSION: Diffuse chronic appearing interstitial coarsening. No consolidation or effusion. Electronically Signed   By: Ellery Plunkaniel R Mitchell M.D.   On: 06/23/2017 23:10   Dg Shoulder Right  Result Date: 06/24/2017 CLINICAL DATA:  RIGHT shoulder pain, fell 1 week ago, pain since EXAM: RIGHT SHOULDER - 2+ VIEW COMPARISON:  None available; prior exam on the time line from 12/15/2015 is not in PACs for comparison FINDINGS: Osseous demineralization. Degenerative changes at RIGHT [facility] joint with joint space narrowing and spur formation/hypertrophy. No acute fracture, dislocation, or bone destruction. Visualized RIGHT ribs intact. Potential radiopaque foreign body on the AP view is not seen on remaining views consistent with superimposed artifact. IMPRESSION: Degenerative changes RIGHT AC joint. No definite acute bony abnormalities. Electronically Signed   By: Ulyses SouthwardMark  Boles M.D.   On: 06/24/2017 17:03   Ct Head Wo Contrast  Result Date: 07/12/2017 CLINICAL DATA:  Weakness all over, worse than normal. Treated for urinary tract infection. EXAM: CT HEAD WITHOUT CONTRAST TECHNIQUE: Contiguous axial images were obtained from the base of the skull through the vertex without  intravenous contrast. COMPARISON:  Most recent CT 06/21/2016. FINDINGS: Brain: No evidence for acute infarction, hemorrhage, mass lesion, hydrocephalus, or extra-axial fluid. Generalized atrophy. Evidence for multiple chronic medium to large vessel infarcts, bihemispheric, clearly worst in the LEFT frontal and posterior frontal region, stable from prior scans. Scattered areas of lacunar infarction, most notable RIGHT caudate. No definite acute cerebral ischemia. Chronic microvascular ischemic change throughout the white matter. Vascular: Calcification of the cavernous internal carotid arteries consistent with cerebrovascular atherosclerotic disease. No signs of intracranial large vessel occlusion. Skull: Normal. Negative for fracture or focal lesion. Sinuses/Orbits: No layering sinus fluid. Dense lenticular opacity on the RIGHT. Calcified shrunken globe on the LEFT. Other: Mastoids are clear. IMPRESSION: Atrophy and small vessel disease.  Multiple old infarcts. No definite acute cerebral ischemia. No intracranial hemorrhage or shift. Calcified shrunken LEFT globe. Electronically Signed   By: Elsie StainJohn T Curnes M.D.   On: 07/12/2017 18:24   Dg Chest Portable 1 View  Result Date: 07/12/2017 CLINICAL DATA:  76 y/o  M; shortness of breath. EXAM: PORTABLE CHEST 1 VIEW COMPARISON:  06/23/2017 chest radiograph. FINDINGS: Stable cardiac silhouette given rotation and technique. Aortic atherosclerosis with calcification. Stable coarse reticular opacities of the lungs. No focal consolidation. No pleural effusion or pneumothorax. IMPRESSION: Stable coarse reticular opacities of the lung which may represent chronic interstitial edema or pneumonitis. No focal consolidation. Electronically Signed   By: Mitzi HansenLance  Furusawa-Stratton M.D.   On: 07/12/2017 17:16     Subjective: Patient laying in bed and reports no pain.  Watching television comfortably.    Discharge Exam: Vitals:   07/16/17 0400 07/16/17 1300  BP: 130/62 126/71   Pulse:  80  Resp:  18  Temp: 97.9 F (36.6 C) 97.8 F (36.6 C)  SpO2: 100% 98%   Vitals:   07/15/17 1400 07/15/17 2212 07/16/17 0400 07/16/17 1300  BP: 115/74 118/62 130/62 126/71  Pulse: 80 84  80  Resp: 19 20  18   Temp: 97.9 F (36.6 C) 98.2 F (36.8 C) 97.9 F (36.6 C) 97.8 F (36.6 C)  TempSrc:  Oral Oral Oral  SpO2: 98% 97% 100% 98%  Weight:      Height:        General: Pt is alert, awake, not in acute distress Cardiovascular: RRR, S1/S2 +, no rubs, no gallops Respiratory: CTA bilaterally, no wheezing, no rhonchi Abdominal: Soft, NT, ND, bowel sounds +  Extremities: no edema, no cyanosis, right sided paralysis    The results of significant diagnostics from this hospitalization (including imaging, microbiology, ancillary and laboratory) are listed below for reference.     Microbiology: Recent Results (from the past 240 hour(s))  Urine Culture     Status: Abnormal   Collection Time: 07/12/17  5:37 PM  Result Value Ref Range Status   Specimen Description URINE, RANDOM  Final   Special Requests NONE  Final   Culture (A)  Final    >=100,000 COLONIES/mL ESCHERICHIA COLI >=100,000 COLONIES/mL CITROBACTER FREUNDII    Report Status 07/15/2017 FINAL  Final   Organism ID, Bacteria ESCHERICHIA COLI (A)  Final   Organism ID, Bacteria CITROBACTER FREUNDII (A)  Final      Susceptibility   Citrobacter freundii - MIC*    CEFAZOLIN >=64 RESISTANT Resistant     CEFTRIAXONE >=64 RESISTANT Resistant     CIPROFLOXACIN 1 SENSITIVE Sensitive     GENTAMICIN <=1 SENSITIVE Sensitive     IMIPENEM 1 SENSITIVE Sensitive     NITROFURANTOIN <=16 SENSITIVE Sensitive     TRIMETH/SULFA >=320 RESISTANT Resistant     PIP/TAZO 32 INTERMEDIATE Intermediate     * >=100,000 COLONIES/mL CITROBACTER FREUNDII   Escherichia coli - MIC*    AMPICILLIN >=32 RESISTANT Resistant     CEFAZOLIN >=64 RESISTANT Resistant     CEFTRIAXONE >=64 RESISTANT Resistant     CIPROFLOXACIN >=4 RESISTANT  Resistant     GENTAMICIN <=1 SENSITIVE Sensitive     IMIPENEM <=0.25 SENSITIVE Sensitive     NITROFURANTOIN <=16 SENSITIVE Sensitive     TRIMETH/SULFA <=20 SENSITIVE Sensitive     AMPICILLIN/SULBACTAM >=32 RESISTANT Resistant     PIP/TAZO 64 INTERMEDIATE Intermediate     Extended ESBL POSITIVE Resistant     * >=100,000 COLONIES/mL ESCHERICHIA COLI     Labs: BNP (last 3 results)  Recent Labs  07/12/17 1705  BNP 99.0   Basic Metabolic Panel:  Recent Labs Lab 07/12/17 1657 07/13/17 0447 07/14/17 0433 07/15/17 0511  NA 137 140 135 137  K 4.4 3.9 3.4* 3.8  CL 101 104 101 104  CO2 27 27 25 26   GLUCOSE 224* 90 67 76  BUN 22* 17 18 24*  CREATININE 1.66* 1.38* 1.32* 1.56*  CALCIUM 8.9 9.2 8.7* 8.5*   Liver Function Tests:  Recent Labs Lab 07/12/17 1657  AST 16  ALT 12*  ALKPHOS 94  BILITOT 0.4  PROT 7.5  ALBUMIN 3.5   No results for input(s): LIPASE, AMYLASE in the last 168 hours. No results for input(s): AMMONIA in the last 168 hours. CBC:  Recent Labs Lab 07/12/17 1657 07/13/17 0447 07/14/17 0433 07/15/17 0511  WBC 18.8* 15.7* 9.8 8.8  NEUTROABS 15.5* 11.8* 4.8 4.0  HGB 14.1 15.0 13.5 12.9*  HCT 40.7 43.9 39.7 38.3*  MCV 90.6 89.6 90.6 90.3  PLT 223 241 230 222   Cardiac Enzymes:  Recent Labs Lab 07/12/17 1657  TROPONINI <0.03   BNP: Invalid input(s): POCBNP CBG:  Recent Labs Lab 07/15/17 1152 07/15/17 1703 07/15/17 2156 07/16/17 0732 07/16/17 1128  GLUCAP 140* 172* 168* 118* 231*   D-Dimer No results for input(s): DDIMER in the last 72 hours. Hgb A1c No results for input(s): HGBA1C in the last 72 hours. Lipid Profile No results for input(s): CHOL, HDL, LDLCALC, TRIG, CHOLHDL, LDLDIRECT in the last 72 hours. Thyroid function studies No results for input(s): TSH, T4TOTAL, T3FREE, THYROIDAB in the last 72 hours.  Invalid input(s): FREET3  Anemia work up No results for input(s): VITAMINB12, FOLATE, FERRITIN, TIBC, IRON, RETICCTPCT  in the last 72 hours. Urinalysis    Component Value Date/Time   COLORURINE YELLOW 07/12/2017 1737   APPEARANCEUR CLOUDY (A) 07/12/2017 1737   LABSPEC 1.013 07/12/2017 1737   PHURINE 5.0 07/12/2017 1737   GLUCOSEU 50 (A) 07/12/2017 1737   HGBUR SMALL (A) 07/12/2017 1737   BILIRUBINUR NEGATIVE 07/12/2017 1737   KETONESUR NEGATIVE 07/12/2017 1737   PROTEINUR 30 (A) 07/12/2017 1737   UROBILINOGEN 1.0 05/30/2015 1022   NITRITE POSITIVE (A) 07/12/2017 1737   LEUKOCYTESUR LARGE (A) 07/12/2017 1737   Sepsis Labs Invalid input(s): PROCALCITONIN,  WBC,  LACTICIDVEN Microbiology Recent Results (from the past 240 hour(s))  Urine Culture     Status: Abnormal   Collection Time: 07/12/17  5:37 PM  Result Value Ref Range Status   Specimen Description URINE, RANDOM  Final   Special Requests NONE  Final   Culture (A)  Final    >=100,000 COLONIES/mL ESCHERICHIA COLI >=100,000 COLONIES/mL CITROBACTER FREUNDII    Report Status 07/15/2017 FINAL  Final   Organism ID, Bacteria ESCHERICHIA COLI (A)  Final   Organism ID, Bacteria CITROBACTER FREUNDII (A)  Final      Susceptibility   Citrobacter freundii - MIC*    CEFAZOLIN >=64 RESISTANT Resistant     CEFTRIAXONE >=64 RESISTANT Resistant     CIPROFLOXACIN 1 SENSITIVE Sensitive     GENTAMICIN <=1 SENSITIVE Sensitive     IMIPENEM 1 SENSITIVE Sensitive     NITROFURANTOIN <=16 SENSITIVE Sensitive     TRIMETH/SULFA >=320 RESISTANT Resistant     PIP/TAZO 32 INTERMEDIATE Intermediate     * >=100,000 COLONIES/mL CITROBACTER FREUNDII   Escherichia coli - MIC*    AMPICILLIN >=32 RESISTANT Resistant     CEFAZOLIN >=64 RESISTANT Resistant     CEFTRIAXONE >=64 RESISTANT Resistant     CIPROFLOXACIN >=4 RESISTANT Resistant     GENTAMICIN <=1 SENSITIVE Sensitive     IMIPENEM <=0.25 SENSITIVE Sensitive     NITROFURANTOIN <=16 SENSITIVE Sensitive     TRIMETH/SULFA <=20 SENSITIVE Sensitive     AMPICILLIN/SULBACTAM >=32 RESISTANT Resistant     PIP/TAZO 64  INTERMEDIATE Intermediate     Extended ESBL POSITIVE Resistant     * >=100,000 COLONIES/mL ESCHERICHIA COLI     Time coordinating discharge: 35 minutes  SIGNED:   Katrinka Blazing, MD  [facility] 07/16/2017, 2:42 PM Pager [phone] If 7PM-7AM, please contact night-coverage [email or web address] Password [email or web address]

## 2017-07-16 NOTE — Clinical Social Work Placement (Signed)
   CLINICAL SOCIAL WORK PLACEMENT  NOTE  Date:  07/16/2017  Patient Details  Name: Lucas Morgan MRN: [id number] Date of Birth: 04/05/1941  Clinical Social Work is seeking post-discharge placement for this patient at the Skilled  Nursing Facility level of care (*CSW will initial, date and re-position this form in  chart as items are completed):  Yes   Patient/family provided with [facility] Clinical Social Work Department's list of facilities offering this level of care within the geographic area requested by the patient (or if unable, by the patient's family).  Yes   Patient/family informed of their freedom to choose among providers that offer the needed level of care, that participate in Medicare, Medicaid or managed care program needed by the patient, have an available bed and are willing to accept the patient.  Yes   Patient/family informed of [facility] ownership interest in [facility] and [facility], as well as of the fact that they are under no obligation to receive care at these facilities.  PASRR submitted to EDS on       PASRR number received on       Existing PASRR number confirmed on 07/15/17     FL2 transmitted to all facilities in geographic area requested by pt/family on 07/15/17     FL2 transmitted to all facilities within larger geographic area on       Patient informed that his/her managed care company has contracts with or will negotiate with certain facilities, including the following:        Yes   Patient/family informed of bed offers received.  Patient chooses bed at [facility] [place]     Physician recommends and patient chooses bed at      Patient to be transferred to [facility] [place] on  .  Patient to be transferred to facility by Ambulance     Patient family notified on 07/16/17 of transfer.  Name of family member notified:  Patient daughter Eather Colas-  [name]     PHYSICIAN Please prepare priority discharge summary, including  medications     Additional Comment:   Macario GoldsJesse Luvinia Lucy, [place] [phone]

## 2017-07-16 NOTE — Progress Notes (Signed)
EMS present to pick up pt.

## 2017-07-16 NOTE — Progress Notes (Signed)
IV removed, WNL. D/C Instructions given to pt and family member. Verbalized understanding. Pt awaiting EMS arrival for transport.

## 2017-07-16 NOTE — Progress Notes (Signed)
Spoke with MD, alternate antibiotic form discussed with pharmacy. PICC line no longer needed. Order discontinued. Order D/C with Vascular wellness as well.

## 2017-07-16 NOTE — Progress Notes (Signed)
Vascular wellness team consulted for request of PICC line insertion. ETA is approx. 2 hours. Will continue to monitor.

## 2017-07-16 NOTE — Progress Notes (Signed)
[facility] [place].

## 2017-07-16 NOTE — Clinical Social Work Note (Signed)
Clinical Social Worker continuing to follow patient and family for support and discharge planning needs.  CSW spoke with patient daughter Eather ColasDelores over the phone who is agreeable with bed offer from [facility] [place].  CSW spoke with facility who is agreeable with admission today.  Clinical Social Worker facilitated patient discharge including contacting patient family and facility to confirm patient discharge plans.  Clinical information faxed to facility and family agreeable with plan.  RN to arrange ambulance transport to [facility] [place].  RN to call report prior to discharge [phone]  [phone]).  Clinical Social Worker will sign off for now as social work intervention is no longer needed. Please consult us again if new need arises.  Macario GoldsJesse Phinneas Shakoor, [place] [phone]

## 2017-07-24 ENCOUNTER — Emergency Department (HOSPITAL_COMMUNITY): Payer: Medicare Other

## 2017-07-24 ENCOUNTER — Other Ambulatory Visit: Payer: Self-pay

## 2017-07-24 ENCOUNTER — Emergency Department (HOSPITAL_COMMUNITY)
Admission: EM | Admit: 2017-07-24 | Discharge: 2017-07-25 | Disposition: A | Discharge status: Home / Self Care | Payer: Medicare Other | Attending: Emergency Medicine | Admitting: Emergency Medicine

## 2017-07-24 ENCOUNTER — Encounter (HOSPITAL_COMMUNITY): Payer: Self-pay | Admitting: *Deleted

## 2017-07-24 DIAGNOSIS — Z96652 Presence of left artificial knee joint: Secondary | ICD-10-CM | POA: Insufficient documentation

## 2017-07-24 DIAGNOSIS — I69351 Hemiplegia and hemiparesis following cerebral infarction affecting right dominant side: Secondary | ICD-10-CM | POA: Diagnosis not present

## 2017-07-24 DIAGNOSIS — E1122 Type 2 diabetes mellitus with diabetic chronic kidney disease: Secondary | ICD-10-CM | POA: Insufficient documentation

## 2017-07-24 DIAGNOSIS — R109 Unspecified abdominal pain: Secondary | ICD-10-CM

## 2017-07-24 DIAGNOSIS — I714 Abdominal aortic aneurysm, without rupture: Secondary | ICD-10-CM | POA: Insufficient documentation

## 2017-07-24 DIAGNOSIS — Z794 Long term (current) use of insulin: Secondary | ICD-10-CM | POA: Insufficient documentation

## 2017-07-24 DIAGNOSIS — I5042 Chronic combined systolic (congestive) and diastolic (congestive) heart failure: Secondary | ICD-10-CM | POA: Diagnosis not present

## 2017-07-24 DIAGNOSIS — N390 Urinary tract infection, site not specified: Secondary | ICD-10-CM | POA: Insufficient documentation

## 2017-07-24 DIAGNOSIS — Z7901 Long term (current) use of anticoagulants: Secondary | ICD-10-CM | POA: Insufficient documentation

## 2017-07-24 DIAGNOSIS — I252 Old myocardial infarction: Secondary | ICD-10-CM | POA: Insufficient documentation

## 2017-07-24 DIAGNOSIS — I69320 Aphasia following cerebral infarction: Secondary | ICD-10-CM | POA: Diagnosis not present

## 2017-07-24 DIAGNOSIS — Z7982 Long term (current) use of aspirin: Secondary | ICD-10-CM | POA: Insufficient documentation

## 2017-07-24 DIAGNOSIS — N189 Chronic kidney disease, unspecified: Secondary | ICD-10-CM | POA: Insufficient documentation

## 2017-07-24 LAB — URINALYSIS, ROUTINE W REFLEX MICROSCOPIC
Bilirubin Urine: NEGATIVE
Glucose, UA: >=500 mg/dL — AB
HGB URINE DIPSTICK: NEGATIVE
Ketones, ur: NEGATIVE mg/dL
Nitrite: POSITIVE — AB
Protein, ur: NEGATIVE mg/dL
SPECIFIC GRAVITY, URINE: 1.009 (ref 1.005–1.030)
pH: 5 (ref 5.0–8.0)

## 2017-07-24 LAB — COMPREHENSIVE METABOLIC PANEL
ALK PHOS: 78 U/L (ref 38–126)
ALT: 15 U/L — AB (ref 17–63)
AST: 18 U/L (ref 15–41)
Albumin: 3.5 g/dL (ref 3.5–5.0)
Anion gap: 7 (ref 5–15)
BUN: 27 mg/dL — AB (ref 6–20)
CALCIUM: 8.9 mg/dL (ref 8.9–10.3)
CHLORIDE: 98 mmol/L — AB (ref 101–111)
CO2: 27 mmol/L (ref 22–32)
CREATININE: 1.59 mg/dL — AB (ref 0.61–1.24)
GFR calc Af Amer: 47 mL/min — ABNORMAL LOW (ref >60)
GFR, EST NON AFRICAN AMERICAN: 41 mL/min — AB (ref >60)
Glucose, Bld: 186 mg/dL — ABNORMAL HIGH (ref 65–99)
Potassium: 4.9 mmol/L (ref 3.5–5.1)
Sodium: 132 mmol/L — ABNORMAL LOW (ref 135–145)
Total Bilirubin: 0.6 mg/dL (ref 0.3–1.2)
Total Protein: 7.2 g/dL (ref 6.5–8.1)

## 2017-07-24 LAB — CBC WITH DIFFERENTIAL/PLATELET
BASOS PCT: 1 %
Basophils Absolute: 0.1 10*3/uL (ref 0.0–0.1)
EOS ABS: 0.5 10*3/uL (ref 0.0–0.7)
Eosinophils Relative: 6 %
HCT: 41.9 % (ref 39.0–52.0)
Hemoglobin: 14.4 g/dL (ref 13.0–17.0)
Lymphocytes Relative: 21 %
Lymphs Abs: 1.8 10*3/uL (ref 0.7–4.0)
MCH: 31.4 pg (ref 26.0–34.0)
MCHC: 34.4 g/dL (ref 30.0–36.0)
MCV: 91.3 fL (ref 78.0–100.0)
MONO ABS: 0.7 10*3/uL (ref 0.1–1.0)
MONOS PCT: 9 %
NEUTROS PCT: 63 %
Neutro Abs: 5.3 10*3/uL (ref 1.7–7.7)
Platelets: 227 10*3/uL (ref 150–400)
RBC: 4.59 MIL/uL (ref 4.22–5.81)
RDW: 12.9 % (ref 11.5–15.5)
WBC: 8.4 10*3/uL (ref 4.0–10.5)

## 2017-07-24 LAB — LIPASE, BLOOD: LIPASE: 25 U/L (ref 11–51)

## 2017-07-24 LAB — LACTIC ACID, PLASMA
LACTIC ACID, VENOUS: 1.5 mmol/L (ref 0.5–1.9)
Lactic Acid, Venous: 2.4 mmol/L (ref 0.5–1.9)

## 2017-07-24 LAB — TROPONIN I: Troponin I: <0.03 ng/mL (ref <0.03)

## 2017-07-24 MED ORDER — SODIUM CHLORIDE 0.9 % IV BOLUS (SEPSIS)
500.0000 mL | Freq: Once | INTRAVENOUS | Status: AC
  Administered 2017-07-24: 500 mL via INTRAVENOUS

## 2017-07-24 MED ORDER — NITROFURANTOIN MONOHYD MACRO 100 MG PO CAPS: 100.0000 mg | ORAL_CAPSULE | Freq: Two times a day (BID) | ORAL | 0 refills | Status: AC

## 2017-07-24 NOTE — ED Notes (Signed)
CRITICAL VALUE ALERT  Critical Value:  Lactic acid 2.4  Date & Time Notied:  07/24/2017 @20 :34 Provider Notified: Dr Clarene Dukemcmanus  Orders Received/Actions taken: no additional orders given

## 2017-07-24 NOTE — ED Notes (Signed)
650 cc returned after irrigating foley.

## 2017-07-24 NOTE — ED Triage Notes (Addendum)
[facility] [place] with c/o right upper quad abd pain, pt has swelling noted to right side of abd area, , staff are also concerned that pt's foley is not draining, was emptied at 3pm and has no urine output since along with  Constipation.

## 2017-07-24 NOTE — ED Provider Notes (Signed)
[facility] EMERGENCY DEPARTMENT Provider Note   CSN: [id number] Arrival date & time: 07/24/17  1834     History   Chief Complaint Chief Complaint  Patient presents with  . Abdominal Pain    HPI EDWING FIGLEY is a 76 y.o. male.  The history is provided by the nursing home, the EMS personnel and the patient. The history is limited by the condition of the patient (Hx confusion).  Abdominal Pain    Pt was seen at 1835. Per EMS, NH report and pt: Pt c/o right sided abd "pain" for the past 2 to 3 days. Has been associated with "constipation" and "foley not draining since 3pm today."  NH told EMS pt has hx of mild confusion. Pt himself denies CP/SOB, no back pain, no N/V/D.   Past Medical History:  Diagnosis Date  . AAA (abdominal aortic aneurysm) (HCC)    a. Abd U/S 7/14: mild aneurysmal dilatation 3x3 cm; cholelithiasis without evid of cholecystitis => repeat 1 year  . Arthritis    stenosis, lumbar region  . CHF (congestive heart failure) (HCC)   . Chronic interstitial lung disease (HCC) 06/24/2016  . Coronary artery disease   . Diabetes mellitus   . Dyslipidemia   . MI (myocardial infarction) (HCC) 11/09/2008   2.5 x 23 Xience V DES to the CFX  . Osteoporosis   . Renal insufficiency   . Stroke [facility])     Patient Active Problem List   Diagnosis Date Noted  . Chronic combined systolic and diastolic congestive heart failure (HCC) 07/13/2017  . Sepsis secondary to UTI (HCC) 07/12/2017  . UTI (urinary tract infection) 06/24/2017  . History of CVA (cerebrovascular accident) 06/24/2017  . Acute lower UTI 06/23/2017  . Urinary retention 06/25/2016  . Chronic interstitial lung disease (HCC) 06/24/2016  . Acute encephalopathy 06/23/2016  . Hypokalemia 06/23/2016  . AKI (acute kidney injury) (HCC)   . Altered mental status 06/21/2016  . Hypomagnesemia 06/21/2016  . Elevated troponin 06/21/2016  . Left leg weakness   . Acute CVA (cerebrovascular accident) (HCC) 01/14/2016    . Fall at home 01/13/2016  . Ataxia 01/13/2016  . Orthostatic hypotension 01/13/2016  . Hip pain, acute   . 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 Tremor of both hands 11/18/2015  . HLD (hyperlipidemia) 11/18/2015  . Type 2 diabetes mellitus with stage 2 chronic kidney disease, with long-term current use of insulin (HCC) 08/15/2015  . S/P carotid endarterectomy 08/15/2015  . Acute on chronic systolic congestive heart failure (HCC) 08/15/2015  . Cardiomyopathy, ischemic 06/18/2015  . Cerebral infarction due to unspecified mechanism   . Chronic pain syndrome 06/11/2015  . Adjustment disorder with anxious mood 06/11/2015  . Aphasia S/P CVA 05/28/2015  . Right hemiparesis (HCC) 05/28/2015  . Stroke, acute, embolic (HCC) 05/27/2015  . Carotid stenosis   . Acute on chronic renal failure (HCC) 05/24/2015  . CVA (cerebral infarction) 05/24/2015  . Leukocytosis 05/24/2015  . Foreign body in colon 04/08/2015  . Spinal stenosis of lumbar region 10/18/2014  . Lumbar stenosis with neurogenic claudication 10/18/2014  . Thyroid nodule 03/12/2013  . AAA (abdominal aortic aneurysm) (HCC) 03/12/2013  . PULMONARY NODULE 08/14/2010  . COUGH 06/30/2010  . HYPOTENSION 05/04/2010  . AODM 01/27/2009  . CAD, NATIVE VESSEL 12/26/2008    Past Surgical History:  Procedure Laterality Date  . BACK SURGERY  2015   lumbar fusion  . CORONARY ANGIOPLASTY    . CORONARY  STENT PLACEMENT    . 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 JOINT REPLACEMENT Left   . KNEE SURGERY         Home Medications    Prior to Admission medications   Medication Sig Start Date End Date Taking? Authorizing Provider  albuterol (PROVENTIL HFA;VENTOLIN HFA) 108 (90 Base) MCG/ACT inhaler Inhale 2 puffs into the lungs every 6 (six) hours as needed for wheezing or shortness of breath.    [provider]  ALPRAZolam Prudy Feeler) 0.25 MG tablet Take 1 tablet  (0.25 mg total) by mouth at bedtime. 07/16/17   Filbert Schilder, MD  aspirin 81 MG tablet Take 1 tablet (81 mg total) by mouth daily. 06/19/15   Ghimire, Werner Lean, MD  atorvastatin (LIPITOR) 80 MG tablet Take 1 tablet (80 mg total) by mouth daily. 09/17/15   Antoine Poche, MD  baclofen (LIORESAL) 20 MG tablet Take 20 mg by mouth at bedtime.    [provider]  clopidogrel (PLAVIX) 75 MG tablet Take 1 tablet (75 mg total) by mouth daily. 06/19/15   Ghimire, Werner Lean, MD  docusate sodium (COLACE) 100 MG capsule Take 100 mg by mouth 2 (two) times daily.    [provider]  escitalopram (LEXAPRO) 5 MG tablet Take 5 mg by mouth daily.    [provider]  gabapentin (NEURONTIN) 100 MG capsule TAKE 1 CAPSULE (100 MG) BY MOUTH THREE TIMES DAILY 06/25/17   Johnson, Clanford L, MD  insulin detemir (LEVEMIR) 100 UNIT/ML injection Inject 20 Units into the skin at bedtime.    [provider]  metFORMIN (GLUCOPHAGE) 500 MG tablet Take 1 tablet (500 mg total) by mouth 2 (two) times daily with a meal. 07/18/17   Filbert Schilder, MD  oxyCODONE-acetaminophen (PERCOCET) 10-325 MG tablet Take 1 tablet by mouth every 6 (six) hours as needed for pain. 07/16/17   Filbert Schilder, MD  pantoprazole (PROTONIX) 40 MG tablet Take 40 mg by mouth daily.    [provider]  primidone (MYSOLINE) 50 MG tablet Take 50 mg by mouth at bedtime.    [provider]  senna-docusate (SENOKOT-S) 8.6-50 MG tablet Take 2 tablets by mouth daily.    [provider]  simvastatin (ZOCOR) 10 MG tablet Take 1 tablet (10 mg total) by mouth daily at 6 PM. 07/16/17   Filbert Schilder, MD    Family History Family History  Problem Relation Age of Onset  . Heart attack Brother        x2 brothers  . CAD Brother   . Stroke Mother     Social History Social History   Tobacco Use  . Smoking status: Never Smoker  . Smokeless tobacco: Never Used  Substance Use  Topics  . Alcohol use: No    Alcohol/week: 0.0 oz  . Drug use: No     Allergies   Trazodone and nefazodone   Review of Systems Review of Systems  Unable to perform ROS: Other  Gastrointestinal: Positive for abdominal pain.     Physical Exam Updated Vital Signs BP 118/70   Pulse 67   Temp 97.9 F (36.6 C) (Oral)   Resp 16   Ht 5\' 9"  (1.753 m)   Wt 68.9 kg (152 lb)   SpO2 100%   BMI 22.45 kg/m   Physical Exam 1840: Physical examination:  Nursing notes reviewed; Vital signs and O2 SAT reviewed;  Constitutional: Well  developed, Well nourished, Well hydrated, In no acute distress; Head:  Normocephalic, atraumatic; Eyes: EOMI, PERRL, No scleral icterus; ENMT: Mouth and pharynx normal, Mucous membranes moist; Neck: Supple, Full range of motion, No lymphadenopathy; Cardiovascular: Regular rate and rhythm, No gallop; Respiratory: Breath sounds clear & equal bilaterally, No wheezes.  Speaking full sentences with ease, Normal respiratory effort/excursion; Chest: Nontender, Movement normal; Abdomen: Soft, +diffuse tenderness to palp. No rebound or guarding. Fading small ecchymosis to lower abd area. Nondistended, Normal bowel sounds; Genitourinary: No CVA tenderness; Extremities: Pulses normal, No tenderness, No edema, No calf edema or asymmetry.; Neuro: Awake, alert, confused re: time. Major CN grossly intact. No facial droop. Speech clear. No gross focal motor or sensory deficits in extremities.; Skin: Color normal, Warm, Dry.   ED Treatments / Results  Labs (all labs ordered are listed, but only abnormal results are displayed)   EKG  EKG Interpretation  Date/Time:  Sunday July 24 2017 19:11:47 EST Ventricular Rate:  70 PR Interval:    QRS Duration: 143 QT Interval:  470 QTC Calculation: 508 R Axis:   -51 Text Interpretation:  Sinus rhythm Left axis deviation Right bundle branch block When compared with ECG of 07/12/2017 No significant change was found Confirmed by  Samuel JesterMcManus, Kleo Dungee [phone]([id number]) on 07/24/2017 8:24:48 PM       Radiology   Procedures Procedures (including critical care time)  Medications Ordered in ED Medications - No data to display   Initial Impression / Assessment and Plan / ED Course  I have reviewed the triage vital signs and the nursing notes.  Pertinent labs & imaging results that were available during my care of the patient were reviewed by me and considered in my medical decision making (see chart for details).  MDM Reviewed: previous chart, nursing note and vitals Reviewed previous: labs, ECG and x-ray Interpretation: labs, ECG, x-ray and CT scan    Results for orders placed or performed during the hospital encounter of 07/24/17  Comprehensive metabolic panel  Result Value Ref Range   Sodium 132 (L) 135 - 145 mmol/L   Potassium 4.9 3.5 - 5.1 mmol/L   Chloride 98 (L) 101 - 111 mmol/L   CO2 27 22 - 32 mmol/L   Glucose, Bld 186 (H) 65 - 99 mg/dL   BUN 27 (H) 6 - 20 mg/dL   Creatinine, Ser 6.041.59 (H) 0.61 - 1.24 mg/dL   Calcium 8.9 8.9 - 54.010.3 mg/dL   Total Protein 7.2 6.5 - 8.1 g/dL   Albumin 3.5 3.5 - 5.0 g/dL   AST 18 15 - 41 U/L   ALT 15 (L) 17 - 63 U/L   Alkaline Phosphatase 78 38 - 126 U/L   Total Bilirubin 0.6 0.3 - 1.2 mg/dL   GFR calc non Af Amer 41 (L) >60 mL/min   GFR calc Af Amer 47 (L) >60 mL/min   Anion gap 7 5 - 15  Lipase, blood  Result Value Ref Range   Lipase 25 11 - 51 U/L  Troponin I  Result Value Ref Range   Troponin I <0.03 <0.03 ng/mL  Lactic acid, plasma  Result Value Ref Range   Lactic Acid, Venous 2.4 (HH) 0.5 - 1.9 mmol/L  CBC with Differential  Result Value Ref Range   WBC 8.4 4.0 - 10.5 K/uL   RBC 4.59 4.22 - 5.81 MIL/uL   Hemoglobin 14.4 13.0 - 17.0 g/dL   HCT 98.141.9 19.139.0 - 47.852.0 %   MCV 91.3 78.0 - 100.0 fL  MCH 31.4 26.0 - 34.0 pg   MCHC 34.4 30.0 - 36.0 g/dL   RDW 45.412.9 09.811.5 - 11.915.5 %   Platelets 227 150 - 400 K/uL   Neutrophils Relative % 63 %   Neutro Abs 5.3 1.7 -  7.7 K/uL   Lymphocytes Relative 21 %   Lymphs Abs 1.8 0.7 - 4.0 K/uL   Monocytes Relative 9 %   Monocytes Absolute 0.7 0.1 - 1.0 K/uL   Eosinophils Relative 6 %   Eosinophils Absolute 0.5 0.0 - 0.7 K/uL   Basophils Relative 1 %   Basophils Absolute 0.1 0.0 - 0.1 K/uL  Urinalysis, Routine w reflex microscopic  Result Value Ref Range   Color, Urine YELLOW YELLOW   APPearance HAZY (A) CLEAR   Specific Gravity, Urine 1.009 1.005 - 1.030   pH 5.0 5.0 - 8.0   Glucose, UA >=500 (A) NEGATIVE mg/dL   Hgb urine dipstick NEGATIVE NEGATIVE   Bilirubin Urine NEGATIVE NEGATIVE   Ketones, ur NEGATIVE NEGATIVE mg/dL   Protein, ur NEGATIVE NEGATIVE mg/dL   Nitrite POSITIVE (A) NEGATIVE   Leukocytes, UA MODERATE (A) NEGATIVE   RBC / HPF TOO NUMEROUS TO COUNT 0 - 5 RBC/hpf   WBC, UA 6-30 0 - 5 WBC/hpf   Bacteria, UA MANY (A) NONE SEEN   Squamous Epithelial / LPF 0-5 (A) NONE SEEN   Budding Yeast PRESENT   Lactic acid, plasma  Result Value Ref Range   Lactic Acid, Venous 1.5 0.5 - 1.9 mmol/L   Ct Abdomen Pelvis Wo Contrast Result Date: 07/24/2017 CLINICAL DATA:  Acute onset of right upper quadrant abdominal pain and swelling. No urinary output. EXAM: CT ABDOMEN AND PELVIS WITHOUT CONTRAST TECHNIQUE: Multidetector CT imaging of the abdomen and pelvis was performed following the standard protocol without IV contrast. COMPARISON:  CT of the abdomen and pelvis performed 03/05/2016 FINDINGS: Lower chest: Bronchiectasis is noted at the lower lung lobes, with associated peripheral opacity, which may reflect sequelae of chronic infection. Mild bilateral honeycombing is noted. The visualized portions of the mediastinum are grossly unremarkable. Hepatobiliary: The mildly nodular contour of the liver likely reflects hepatic cirrhosis. A stone is noted within the gallbladder. The gallbladder is otherwise unremarkable. The common bile duct remains normal in caliber. Pancreas: The pancreas is within normal limits.  Spleen: The spleen is unremarkable in appearance. Adrenals/Urinary Tract: The adrenal glands are unremarkable in appearance. Nonspecific perinephric stranding is noted bilaterally. There is no evidence of hydronephrosis. No renal or ureteral stones are identified. Stomach/Bowel: The stomach is unremarkable in appearance. The small bowel is within normal limits. The appendix is not visualized; there is no evidence for appendicitis. The colon is unremarkable in appearance. Vascular/Lymphatic: There is mild aneurysmal dilatation of the infrarenal abdominal aorta to 3.2 cm in AP dimension. Scattered calcification is seen along the abdominal aorta and its branches. Aneurysmal dilatation resolves proximal to the aortic bifurcation. The inferior vena cava is grossly unremarkable in appearance. No retroperitoneal or pelvic sidewall lymphadenopathy is seen. Reproductive: The bladder is decompressed, with a Foley catheter in place. The prostate is normal in size, with scattered calcification. Other: No additional soft tissue abnormalities are seen. Musculoskeletal: No acute osseous abnormalities are identified. Chronic compression deformities are noted at T11, L1 and L4, with changes of vertebroplasty at L1 and L4. Underlying facet disease is noted at the lower lumbar spine. The visualized musculature is unremarkable in appearance. IMPRESSION: 1. No acute abnormality seen to explain the patient's symptoms. 2. Bronchiectasis at the  lower lung lobes, with associated peripheral opacity, which may reflect sequelae of chronic infection. Mild bilateral honeycombing noted. 3. Aortic aneurysm NOS (ICD10-I71.9). Infrarenal abdominal aorta measures 3.2 cm in AP dimension. Recommend followup by ultrasound in 3 years. This recommendation follows ACR consensus guidelines: White Paper of the ACR Incidental Findings Committee II on Vascular Findings. J Am Coll Radiol 2013; 16:109-604 4.  Aortic Atherosclerosis (ICD10-I70.0). 5. Findings of  hepatic cirrhosis. 6. Cholelithiasis.  Gallbladder otherwise unremarkable. Electronically Signed   By: Roanna Raider M.D.   On: 07/24/2017 22:39   Dg Chest 1 View Result Date: 07/24/2017 CLINICAL DATA:  76 y/o  M; right upper quadrant abdominal pain. EXAM: CHEST 1 VIEW COMPARISON:  07/12/2017 chest radiograph FINDINGS: Stable normal cardiac silhouette. Coarse reticular markings of the lungs compatible with chronic interstitial edema or lung disease. No focal consolidation, effusion, or pneumothorax. No acute osseous abnormality is evident. Osteoarthrosis of the acromioclavicular joints and thoracic spine IMPRESSION: Stable coarse reticular markings of lungs compatible with chronic interstitial edema or lung disease. No acute pulmonary process identified. Electronically Signed   By: Mitzi Hansen M.D.   On: 07/24/2017 22:23    2340:  Pt with chronic indwelling foley; possible UTI, UC is pending. Previous UC sensitive to macrobid; will rx same. Mildly elevated lactic acid cleared with IVF. WBC count normal. BUN/Cr per baseline. CT without acute findings. Pt remains afebrile. No clear indication for admission at this time. Will d/c back to NH. Dx and testing d/w pt and family.  Questions answered.  Verb understanding, agreeable to d/c back to NH with outpt f/u. Family agreeable with plan.     Final Clinical Impressions(s) / ED Diagnoses   Final diagnoses:  None    ED Discharge Orders    None        Samuel Jester, DO 07/26/17 1028

## 2017-07-24 NOTE — Discharge Instructions (Signed)
Take the prescription as directed.  Call your regular medical doctor tomorrow morning to schedule a follow up appointment within the next 2 days.  Return to the Emergency Department immediately sooner if worsening.  ° °

## 2017-07-25 LAB — LACTIC ACID, PLASMA: LACTIC ACID, VENOUS: 1.1 mmol/L (ref 0.5–1.9)

## 2017-07-25 NOTE — ED Notes (Signed)
This RN called both daughters to make them aware that the pt had left and was [facility] for Macrobid.

## 2017-07-27 LAB — URINE CULTURE: Culture: >=100000 — AB

## 2017-07-28 ENCOUNTER — Telehealth (HOSPITAL_BASED_OUTPATIENT_CLINIC_OR_DEPARTMENT_OTHER): Payer: Self-pay

## 2017-07-28 NOTE — Telephone Encounter (Signed)
Post ED Visit - Positive Culture Follow-up  Culture report reviewed by antimicrobial stewardship pharmacist:  []  Lucas Morgan, Pharm.D. []  Lucas Morgan, Pharm.D., BCPS AQ-ID []  Lucas Morgan, Pharm.D., BCPS []  Lucas Morgan, Pharm.D., BCPS []  Lucas Morgan, [address]Pharm[address]D., BCPS, AAHIVP []  Lucas Morgan, Pharm.D., BCPS, AAHIVP []  Lucas Morgan, PharmD, BCPS []  Lucas Morgan, PharmD, BCPS []  Lucas Morgan, PharmD, BCPS X  Lucas Morgan, RPh  Positive urine culture  Treated with Nitrofurantoin, organism sensitive to the same and no further patient follow-up is required at this time.  OK  Lucas Morgan, Lucas Morgan 07/28/2017, 3:54 PM

## 2017-09-19 ENCOUNTER — Encounter (HOSPITAL_COMMUNITY): Payer: Self-pay | Admitting: Cardiology

## 2017-09-19 ENCOUNTER — Other Ambulatory Visit: Payer: Self-pay

## 2017-09-19 ENCOUNTER — Emergency Department (HOSPITAL_COMMUNITY): Payer: Medicare Other

## 2017-09-19 ENCOUNTER — Emergency Department (HOSPITAL_COMMUNITY)
Admission: EM | Admit: 2017-09-19 | Discharge: 2017-09-19 | Disposition: A | Discharge status: Home / Self Care | Payer: Medicare Other | Attending: Emergency Medicine | Admitting: Emergency Medicine

## 2017-09-19 DIAGNOSIS — Z7982 Long term (current) use of aspirin: Secondary | ICD-10-CM | POA: Insufficient documentation

## 2017-09-19 DIAGNOSIS — Z79899 Other long term (current) drug therapy: Secondary | ICD-10-CM | POA: Insufficient documentation

## 2017-09-19 DIAGNOSIS — R251 Tremor, unspecified: Secondary | ICD-10-CM | POA: Diagnosis present

## 2017-09-19 DIAGNOSIS — Z794 Long term (current) use of insulin: Secondary | ICD-10-CM | POA: Diagnosis not present

## 2017-09-19 DIAGNOSIS — N3001 Acute cystitis with hematuria: Secondary | ICD-10-CM | POA: Diagnosis not present

## 2017-09-19 LAB — COMPREHENSIVE METABOLIC PANEL
ALBUMIN: 3.2 g/dL — AB (ref 3.5–5.0)
ALT: 13 U/L — ABNORMAL LOW (ref 17–63)
ANION GAP: 8 (ref 5–15)
AST: 16 U/L (ref 15–41)
Alkaline Phosphatase: 67 U/L (ref 38–126)
BUN: 14 mg/dL (ref 6–20)
CHLORIDE: 101 mmol/L (ref 101–111)
CO2: 28 mmol/L (ref 22–32)
Calcium: 8.9 mg/dL (ref 8.9–10.3)
Creatinine, Ser: 1.3 mg/dL — ABNORMAL HIGH (ref 0.61–1.24)
GFR calc Af Amer: 60 mL/min — ABNORMAL LOW (ref >60)
GFR calc non Af Amer: 52 mL/min — ABNORMAL LOW (ref >60)
GLUCOSE: 189 mg/dL — AB (ref 65–99)
POTASSIUM: 4.3 mmol/L (ref 3.5–5.1)
SODIUM: 137 mmol/L (ref 135–145)
Total Bilirubin: 0.4 mg/dL (ref 0.3–1.2)
Total Protein: 6.9 g/dL (ref 6.5–8.1)

## 2017-09-19 LAB — CBC WITH DIFFERENTIAL/PLATELET
BASOS ABS: 0 10*3/uL (ref 0.0–0.1)
BASOS PCT: 1 %
EOS ABS: 0.5 10*3/uL (ref 0.0–0.7)
EOS PCT: 6 %
HCT: 40.7 % (ref 39.0–52.0)
Hemoglobin: 13.4 g/dL (ref 13.0–17.0)
Lymphocytes Relative: 26 %
Lymphs Abs: 2.2 10*3/uL (ref 0.7–4.0)
MCH: 29.7 pg (ref 26.0–34.0)
MCHC: 32.9 g/dL (ref 30.0–36.0)
MCV: 90.2 fL (ref 78.0–100.0)
MONO ABS: 1 10*3/uL (ref 0.1–1.0)
Monocytes Relative: 12 %
Neutro Abs: 4.9 10*3/uL (ref 1.7–7.7)
Neutrophils Relative %: 57 %
PLATELETS: 193 10*3/uL (ref 150–400)
RBC: 4.51 MIL/uL (ref 4.22–5.81)
RDW: 13.6 % (ref 11.5–15.5)
WBC: 8.6 10*3/uL (ref 4.0–10.5)

## 2017-09-19 LAB — URINALYSIS, ROUTINE W REFLEX MICROSCOPIC
BILIRUBIN URINE: NEGATIVE
GLUCOSE, UA: NEGATIVE mg/dL
Hgb urine dipstick: NEGATIVE
KETONES UR: NEGATIVE mg/dL
Nitrite: POSITIVE — AB
Protein, ur: 30 mg/dL — AB
SPECIFIC GRAVITY, URINE: 1.011 (ref 1.005–1.030)
SQUAMOUS EPITHELIAL / LPF: NONE SEEN
pH: 5 (ref 5.0–8.0)

## 2017-09-19 LAB — RAPID STREP SCREEN (MED CTR MEBANE ONLY): Streptococcus, Group A Screen (Direct): NEGATIVE

## 2017-09-19 MED ORDER — SODIUM CHLORIDE 0.9 % IV BOLUS (SEPSIS)
500.0000 mL | Freq: Once | INTRAVENOUS | Status: AC
  Administered 2017-09-19: 500 mL via INTRAVENOUS

## 2017-09-19 MED ORDER — DEXTROSE 5 % IV SOLN
1.0000 g | Freq: Once | INTRAVENOUS | Status: AC
  Administered 2017-09-19: 1 g via INTRAVENOUS
  Filled 2017-09-19: qty 10

## 2017-09-19 MED ORDER — LORAZEPAM 0.5 MG PO TABS: ORAL_TABLET | ORAL | 0 refills | Status: AC

## 2017-09-19 MED ORDER — LORAZEPAM 0.5 MG PO TABS
0.5000 mg | ORAL_TABLET | Freq: Once | ORAL | Status: AC
  Administered 2017-09-19: 0.5 mg via ORAL
  Filled 2017-09-19: qty 1

## 2017-09-19 MED ORDER — CEPHALEXIN 500 MG PO CAPS: 500.0000 mg | ORAL_CAPSULE | Freq: Four times a day (QID) | ORAL | 0 refills | Status: AC

## 2017-09-19 NOTE — ED Notes (Signed)
PT'S FAMILY CAME TO THE NURSING STATION STATING THAT HE WAS HAVING A SEIZURE WHEN I ENTERED THE ROOM THE PT WAS TIMBERING BUT WAS ABLE TO ANSWER QUESTIONS. HE STATED THAT HIS THROAT IS HURTING HIM AND HE WANTED SOME WATER. DR ZAMMITT WAS NOTIFIED VIA PHONE OF THESE C/O'S.

## 2017-09-19 NOTE — ED Notes (Signed)
REPORT GIVEN TO Y. WALL RN

## 2017-09-19 NOTE — ED Triage Notes (Signed)
[facility] [place].  Per staff ? Seizure.  Per room mate pt has been having tremors.  Per pt he has had tremors since yesterday.

## 2017-09-19 NOTE — ED Provider Notes (Signed)
[facility] EMERGENCY DEPARTMENT Provider Note   CSN: [id number] Arrival date & time: 09/19/17  1718     History   Chief Complaint Chief Complaint  Patient presents with  . Tremors    HPI Lucas Morgan is a 77 y.o. male.  Patient has been having some tremors recently and was sent over from the nursing home   The history is provided by the nursing home. No language interpreter was used.  Illness  This is a new problem. The current episode started more than 1 week ago. The problem occurs rarely. The problem has not changed since onset.Pertinent negatives include no chest pain. Nothing aggravates the symptoms. Nothing relieves the symptoms.    Past Medical History:  Diagnosis Date  . AAA (abdominal aortic aneurysm) (HCC)    a. Abd U/S 7/14: mild aneurysmal dilatation 3x3 cm; cholelithiasis without evid of cholecystitis => repeat 1 year  . Arthritis    stenosis, lumbar region  . CHF (congestive heart failure) (HCC)   . Chronic interstitial lung disease (HCC) 06/24/2016  . Coronary artery disease   . Diabetes mellitus   . Dyslipidemia   . MI (myocardial infarction) (HCC) 11/09/2008   2.5 x 23 Xience V DES to the CFX  . Osteoporosis   . Renal insufficiency   . Stroke [facility])     Patient Active Problem List   Diagnosis Date Noted  . Chronic combined systolic and diastolic congestive heart failure (HCC) 07/13/2017  . Sepsis secondary to UTI (HCC) 07/12/2017  . UTI (urinary tract infection) 06/24/2017  . History of CVA (cerebrovascular accident) 06/24/2017  . Acute lower UTI 06/23/2017  . Urinary retention 06/25/2016  . Chronic interstitial lung disease (HCC) 06/24/2016  . Acute encephalopathy 06/23/2016  . Hypokalemia 06/23/2016  . AKI (acute kidney injury) (HCC)   . Altered mental status 06/21/2016  . Hypomagnesemia 06/21/2016  . Elevated troponin 06/21/2016  . Left leg weakness   . Acute CVA (cerebrovascular accident) (HCC) 01/14/2016  . Fall at home 01/13/2016    . Ataxia 01/13/2016  . Orthostatic hypotension 01/13/2016  . Hip pain, acute   . 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 Tremor of both hands 11/18/2015  . HLD (hyperlipidemia) 11/18/2015  . Type 2 diabetes mellitus with stage 2 chronic kidney disease, with long-term current use of insulin (HCC) 08/15/2015  . S/P carotid endarterectomy 08/15/2015  . Acute on chronic systolic congestive heart failure (HCC) 08/15/2015  . Cardiomyopathy, ischemic 06/18/2015  . Cerebral infarction due to unspecified mechanism   . Chronic pain syndrome 06/11/2015  . Adjustment disorder with anxious mood 06/11/2015  . Aphasia S/P CVA 05/28/2015  . Right hemiparesis (HCC) 05/28/2015  . Stroke, acute, embolic (HCC) 05/27/2015  . Carotid stenosis   . Acute on chronic renal failure (HCC) 05/24/2015  . CVA (cerebral infarction) 05/24/2015  . Leukocytosis 05/24/2015  . Foreign body in colon 04/08/2015  . Spinal stenosis of lumbar region 10/18/2014  . Lumbar stenosis with neurogenic claudication 10/18/2014  . Thyroid nodule 03/12/2013  . AAA (abdominal aortic aneurysm) (HCC) 03/12/2013  . PULMONARY NODULE 08/14/2010  . COUGH 06/30/2010  . HYPOTENSION 05/04/2010  . AODM 01/27/2009  . CAD, NATIVE VESSEL 12/26/2008    Past Surgical History:  Procedure Laterality Date  . BACK SURGERY  2015   lumbar fusion  . CORONARY ANGIOPLASTY    . CORONARY STENT PLACEMENT    . ENDARTERECTOMY Left 06/17/2015   Procedure: Left Carotid ENDARTERECTOMY with  Livia SnellenXenosure Patch;  Surgeon: Chuck Hinthristopher S Dickson, MD;  [facility];  Service: Vascular;  Laterality: Left;  . EP IMPLANTABLE DEVICE N/A 05/26/2015   Procedure: Loop Recorder Insertion;  Surgeon: Marinus MawGregg W Taylor, MD;  Location: [facility] INVASIVE CV LAB;  Service: Cardiovascular;  Laterality: N/A;  . 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 JOINT REPLACEMENT Left   . KNEE SURGERY    . LUMBAR  LAMINECTOMY/DECOMPRESSION MICRODISCECTOMY N/A 10/18/2014   Procedure: LUMBAR LAMINECTOMY/DECOMPRESSION MICRODISCECTOMYLUMBAR THREE-FOUR ;  Surgeon: Temple PaciniHenry A Pool, MD;  Location: [facility] NEURO ORS;  Service: Neurosurgery;  Laterality: N/A;  . TEE WITHOUT CARDIOVERSION N/A 05/26/2015   Procedure: TRANSESOPHAGEAL ECHOCARDIOGRAM (TEE);  Surgeon: Vesta MixerPhilip J Nahser, MD;  [facility];  Service: Cardiovascular;  Laterality: N/A;       Home Medications    Prior to Admission medications   Medication Sig Start Date End Date Taking? Authorizing Provider  albuterol (PROAIR HFA) 108 (90 Base) MCG/ACT inhaler Inhale 2 puffs into the lungs every 6 (six) hours as needed for wheezing or shortness of breath.   Yes [provider]  ALPRAZolam (XANAX) 0.25 MG tablet Take 1 tablet (0.25 mg total) by mouth at bedtime. 07/16/17  Yes Filbert SchilderKadolph, Alexandria U, MD  aspirin 81 MG tablet Take 1 tablet (81 mg total) by mouth daily. 06/19/15  Yes Ghimire, Werner LeanShanker M, MD  atorvastatin (LIPITOR) 80 MG tablet Take 1 tablet (80 mg total) by mouth daily. Patient taking differently: Take 40 mg by mouth every evening.  09/17/15  Yes BranchDorothe Pea, Jonathan F, MD  azelastine (OPTIVAR) 0.05 % ophthalmic solution Place 1 drop into both eyes 2 (two) times daily.   Yes [provider]  baclofen (LIORESAL) 10 MG tablet Take 10 mg by mouth at bedtime.    Yes [provider]  carvedilol (COREG) 3.125 MG tablet Take 3.125 mg by mouth 2 (two) times daily with a meal.   Yes [provider]  clopidogrel (PLAVIX) 75 MG tablet Take 1 tablet (75 mg total) by mouth daily. 06/19/15  Yes Ghimire, Werner LeanShanker M, MD  docusate sodium (COLACE) 100 MG capsule Take 100 mg by mouth 2 (two) times daily.   Yes [provider]  escitalopram (LEXAPRO) 5 MG tablet Take 5 mg by mouth daily.   Yes [provider]  gabapentin (NEURONTIN) 100 MG capsule TAKE 1 CAPSULE (100 MG) BY MOUTH THREE TIMES DAILY Patient taking  differently: Take 200 mg by mouth 3 (three) times daily.  06/25/17  Yes Johnson, Clanford L, MD  insulin detemir (LEVEMIR) 100 UNIT/ML injection Inject 20 Units into the skin at bedtime.   Yes [provider]  lidocaine (LIDODERM) 5 % Place 1 patch onto the skin daily. Remove & Discard patch within 12 hours or as directed by MD   Yes [provider]  magnesium oxide (MAG-OX) 400 MG tablet Take 400 mg by mouth daily.   Yes [provider]  metFORMIN (GLUCOPHAGE) 500 MG tablet Take 1 tablet (500 mg total) by mouth 2 (two) times daily with a meal. 07/18/17  Yes Filbert SchilderKadolph, Alexandria U, MD  oxyCODONE-acetaminophen (PERCOCET) 10-325 MG tablet Take 1 tablet by mouth every 6 (six) hours as needed for pain. Patient taking differently: Take 1 tablet by mouth every 4 (four) hours as needed for pain.  07/16/17  Yes Filbert SchilderKadolph, Alexandria U, MD  pantoprazole (PROTONIX) 40 MG tablet Take 40 mg by mouth daily.   Yes [provider]  primidone (MYSOLINE) 50 MG tablet Take 50 mg by mouth at bedtime.   Yes [provider]  senna-docusate (SENOKOT-S) 8.6-50 MG tablet Take 2 tablets by mouth daily.   Yes [provider]  cephALEXin (KEFLEX) 500 MG capsule Take 1 capsule (500 mg total) by mouth 4 (four) times daily. 09/19/17   Bethann Berkshire, MD  LORazepam (ATIVAN) 0.5 MG tablet Take one every 12 hours as needed for shaking 09/19/17   Bethann Berkshire, MD  nitrofurantoin, macrocrystal-monohydrate, (MACROBID) 100 MG capsule Take 1 capsule (100 mg total) 2 (two) times daily by mouth. Patient not taking: Reported on 09/19/2017 07/24/17   Samuel Jester, DO    Family History Family History  Problem Relation Age of Onset  . Heart attack Brother        x2 brothers  . CAD Brother   . Stroke Mother     Social History Social History   Tobacco Use  . Smoking status: Never Smoker  . Smokeless tobacco: Never Used  Substance Use Topics  . Alcohol use: No    Alcohol/week: 0.0  oz  . Drug use: No     Allergies   Trazodone and nefazodone   Review of Systems Review of Systems  Unable to perform ROS: Dementia  Cardiovascular: Negative for chest pain.     Physical Exam Updated Vital Signs BP (!) 171/158 (BP Location: Right Arm)   Pulse 68   Temp 99.3 F (37.4 C) (Rectal)   Resp 18   Ht 5\' 8"  (1.727 m)   Wt 68.9 kg (152 lb)   SpO2 95%   BMI 23.11 kg/m   Physical Exam  Constitutional: He appears well-developed.  HENT:  Head: Normocephalic.  Eyes: Conjunctivae and EOM are normal. No scleral icterus.  Neck: Neck supple. No thyromegaly present.  Cardiovascular: Normal rate and regular rhythm. Exam reveals no gallop and no friction rub.  No murmur heard. Pulmonary/Chest: No stridor. He has no wheezes. He has no rales. He exhibits no tenderness.  Abdominal: He exhibits no distension. There is no tenderness. There is no rebound.  Musculoskeletal: Normal range of motion. He exhibits no edema.  Lymphadenopathy:    He has no cervical adenopathy.  Neurological: He is alert. He exhibits normal muscle tone. Coordination normal.  Mild tremors  Skin: No rash noted. No erythema.  Psychiatric: He has a normal mood and affect. His behavior is normal.     ED Treatments / Results  Labs (all labs ordered are listed, but only abnormal results are displayed) Labs Reviewed  COMPREHENSIVE METABOLIC PANEL - Abnormal; Notable for the following components:      Result Value   Glucose, Bld 189 (*)    Creatinine, Ser 1.30 (*)    Albumin 3.2 (*)    ALT 13 (*)    GFR calc non Af Amer 52 (*)    GFR calc Af Amer 60 (*)    All other components within normal limits  URINALYSIS, ROUTINE W REFLEX MICROSCOPIC - Abnormal; Notable for the following components:   APPearance CLOUDY (*)    Protein, ur 30 (*)    Nitrite POSITIVE (*)    Leukocytes, UA LARGE (*)    Bacteria, UA MANY (*)    All other components within normal limits  RAPID STREP SCREEN (NOT AT [facility])    CULTURE, GROUP A STREP [facility])  URINE CULTURE  CBC WITH DIFFERENTIAL/PLATELET    EKG  EKG Interpretation None       Radiology Dg  Chest 2 View  Result Date: 09/19/2017 CLINICAL DATA:  Tremors and neck pain. Chronic interstitial lung disease. Congestion. EXAM: CHEST  2 VIEW COMPARISON:  07/24/2017 FINDINGS: Coarse lung markings, right side greater than left. These findings are unchanged. Cardiac recorder device in the left chest is again noted. Heart size is stable and within normal limits. Degenerative changes at the [facility] joints bilaterally. No large pleural effusions. IMPRESSION: Stable chronic lung changes.  No acute findings. Electronically Signed   By: Richarda Overlie M.D.   On: 09/19/2017 20:05   Ct Head Wo Contrast  Result Date: 09/19/2017 CLINICAL DATA:  77 year old male with possible seizure. Increasing tremors. EXAM: CT HEAD WITHOUT CONTRAST TECHNIQUE: Contiguous axial images were obtained from the base of the skull through the vertex without intravenous contrast. COMPARISON:  Head CT without contrast 07/12/2017 and earlier. FINDINGS: Brain: Patchy and confluent bilateral cerebral white matter hypodensity with superimposed chronic cortical encephalomalacia in both the left frontal and parietal lobes. Small chronic infarcts in the right caudate and right cerebellum. Stable gray-white matter differentiation throughout the brain. No midline shift, ventriculomegaly, mass effect, evidence of mass lesion, intracranial hemorrhage or evidence of cortically based acute infarction. Vascular: No suspicious intracranial vascular hyperdensity. Mild Calcified atherosclerosis at the skull base. Skull: Stable osteopenia.  No acute osseous abnormality identified. Sinuses/Orbits: Stable and well pneumatized aside from chronic mucous retention cyst in the dominant left sphenoid sinus. Other: Chronic left phthisis bulbi. No acute orbit or scalp soft tissue finding. IMPRESSION: Chronic ischemic disease appears stable  since 2018. No acute intracranial abnormality. Electronically Signed   By: Odessa Fleming M.D.   On: 09/19/2017 18:15    Procedures Procedures (including critical care time)  Medications Ordered in ED Medications  LORazepam (ATIVAN) tablet 0.5 mg (not administered)  sodium chloride 0.9 % bolus 500 mL (0 mLs Intravenous Stopped 09/19/17 2254)  cefTRIAXone (ROCEPHIN) 1 g in dextrose 5 % 50 mL IVPB (0 g Intravenous Stopped 09/19/17 2254)     Initial Impression / Assessment and Plan / ED Course  I have reviewed the triage vital signs and the nursing notes.  Pertinent labs & imaging results that were available during my care of the patient were reviewed by me and considered in my medical decision making (see chart for details).   Patient dying noticed with urinary tract infection and tremors of unknown etiology.  Patient will be placed on Keflex and also Ativan as needed for tremor    Final Clinical Impressions(s) / ED Diagnoses   Final diagnoses:  Acute cystitis with hematuria    ED Discharge Orders        Ordered    cephALEXin (KEFLEX) 500 MG capsule  4 times daily     09/19/17 2323    LORazepam (ATIVAN) 0.5 MG tablet     09/19/17 2323       Bethann Berkshire, MD 09/19/17 2326

## 2017-09-19 NOTE — Discharge Instructions (Signed)
Follow-up with your doctor later this week to recheck this shaking and urinary tract infection

## 2017-09-22 LAB — CULTURE, GROUP A STREP (THRC)

## 2017-09-24 LAB — URINE CULTURE: Culture: >=100000 — AB

## 2017-09-25 ENCOUNTER — Telehealth: Payer: Self-pay

## 2017-09-25 NOTE — Progress Notes (Signed)
ED Antimicrobial Stewardship Positive Culture Follow Up   Lucas EatonJohn C Morgan is an 77 y.o. male who presented to [facility] 09/19/2017 with a chief complaint of  Chief Complaint  Patient presents with  . Tremors    Recent Results (from the past 720 hour(s))  Rapid strep screen     Status: None   Collection Time: 09/19/17  8:28 PM  Result Value Ref Range Status   Streptococcus, Group A Screen (Direct) NEGATIVE NEGATIVE Final    Comment: (NOTE) A Rapid Antigen test may result negative if the antigen level in the sample is below the detection level of this test. The FDA has not cleared this test as a stand-alone test therefore the rapid antigen negative result has reflexed to a Group A Strep culture.   Culture, group A strep     Status: None   Collection Time: 09/19/17  8:28 PM  Result Value Ref Range Status   Specimen Description THROAT  Final   Special Requests NONE Reflexed from [id number]  Final   Culture   Final    NO GROUP A STREP (S.PYOGENES) ISOLATED Performed at [facility], [address] [place], [place] [address]    Report Status 09/22/2017 FINAL  Final  Urine Culture     Status: Abnormal   Collection Time: 09/19/17  9:45 PM  Result Value Ref Range Status   Specimen Description URINE, CLEAN CATCH  Final   Special Requests NONE  Final   Culture (A)  Final    >=100,000 COLONIES/mL ESCHERICHIA COLI Confirmed Extended Spectrum Beta-Lactamase Producer (ESBL).  In bloodstream infections from ESBL organisms, carbapenems are preferred over piperacillin/tazobactam. They are shown to have a lower risk of mortality. Performed at [facility], [address] [place], [place] [address]    Report Status 09/24/2017 FINAL  Final   Organism ID, Bacteria ESCHERICHIA COLI (A)  Final      Susceptibility   Escherichia coli - MIC*    AMPICILLIN >=32 RESISTANT Resistant     CEFAZOLIN >=64 RESISTANT Resistant     CEFTRIAXONE >=64 RESISTANT Resistant     CIPROFLOXACIN >=4  RESISTANT Resistant     GENTAMICIN <=1 SENSITIVE Sensitive     IMIPENEM <=0.25 SENSITIVE Sensitive     NITROFURANTOIN <=16 SENSITIVE Sensitive     TRIMETH/SULFA <=20 SENSITIVE Sensitive     AMPICILLIN/SULBACTAM >=32 RESISTANT Resistant     PIP/TAZO 8 SENSITIVE Sensitive     Extended ESBL POSITIVE Resistant     * >=100,000 COLONIES/mL ESCHERICHIA COLI     [x]  Treated with keflex, organism resistant to prescribed antimicrobial []  Patient discharged originally without antimicrobial agent and treatment is now indicated  New antibiotic prescription: bactrim ds bid x 1 week  ED Provider: Jodi Geraldskelsey ford, pa-c  Bertram MillardMichael A France Lusty 09/25/2017, 3:29 PM Infectious Diseases Pharmacist Phone# [phone]

## 2017-09-25 NOTE — Telephone Encounter (Signed)
Post ED Visit - Positive Culture Follow-up: Unsuccessful Patient Follow-up  Culture assessed and recommendations reviewed by:  []  Enzo BiNathan Batchelder, Pharm.D. []  Celedonio MiyamotoJeremy Frens, Pharm.D., BCPS AQ-ID [x]  Garvin FilaMike Maccia, Pharm.D., BCPS []  Georgina PillionElizabeth Martin, [address]Ph[address]D., BCPS []  AllertonMinh Pham, [place]D., BCPS, AAHIVP []  Estella HuskMichelle Turner, Pharm.D., BCPS, AAHIVP []  Lysle Pearlachel Rumbarger, PharmD, BCPS []  Casilda Carlsaylor Stone, PharmD, BCPS []  Pollyann SamplesAndy Johnston, PharmD, BCPS  Positive urine culture  []  Patient discharged without antimicrobial prescription and treatment is now indicated [x]  Organism is resistant to prescribed ED discharge antimicrobial []  Patient with positive blood cultures   Unable to contact patient after 3 attempts, letter will be sent to address on file  Jerry CarasCullom, Orrin Yurkovich Burnett 09/25/2017, 3:46 PM

## 2017-10-10 IMAGING — DX DG CERVICAL SPINE 2 OR 3 VIEWS
3 series · 3 of 3 positions shown · non-contrast
Comparison: None.

CLINICAL DATA: Pt was seen in ED last night due to fall. Pt was
walking to the bathroom in his home when he fell against the wall.
Pt c/o popping and stiffness in neck since.

EXAM:
CERVICAL SPINE - 2-3 VIEW

[c-spine lat]
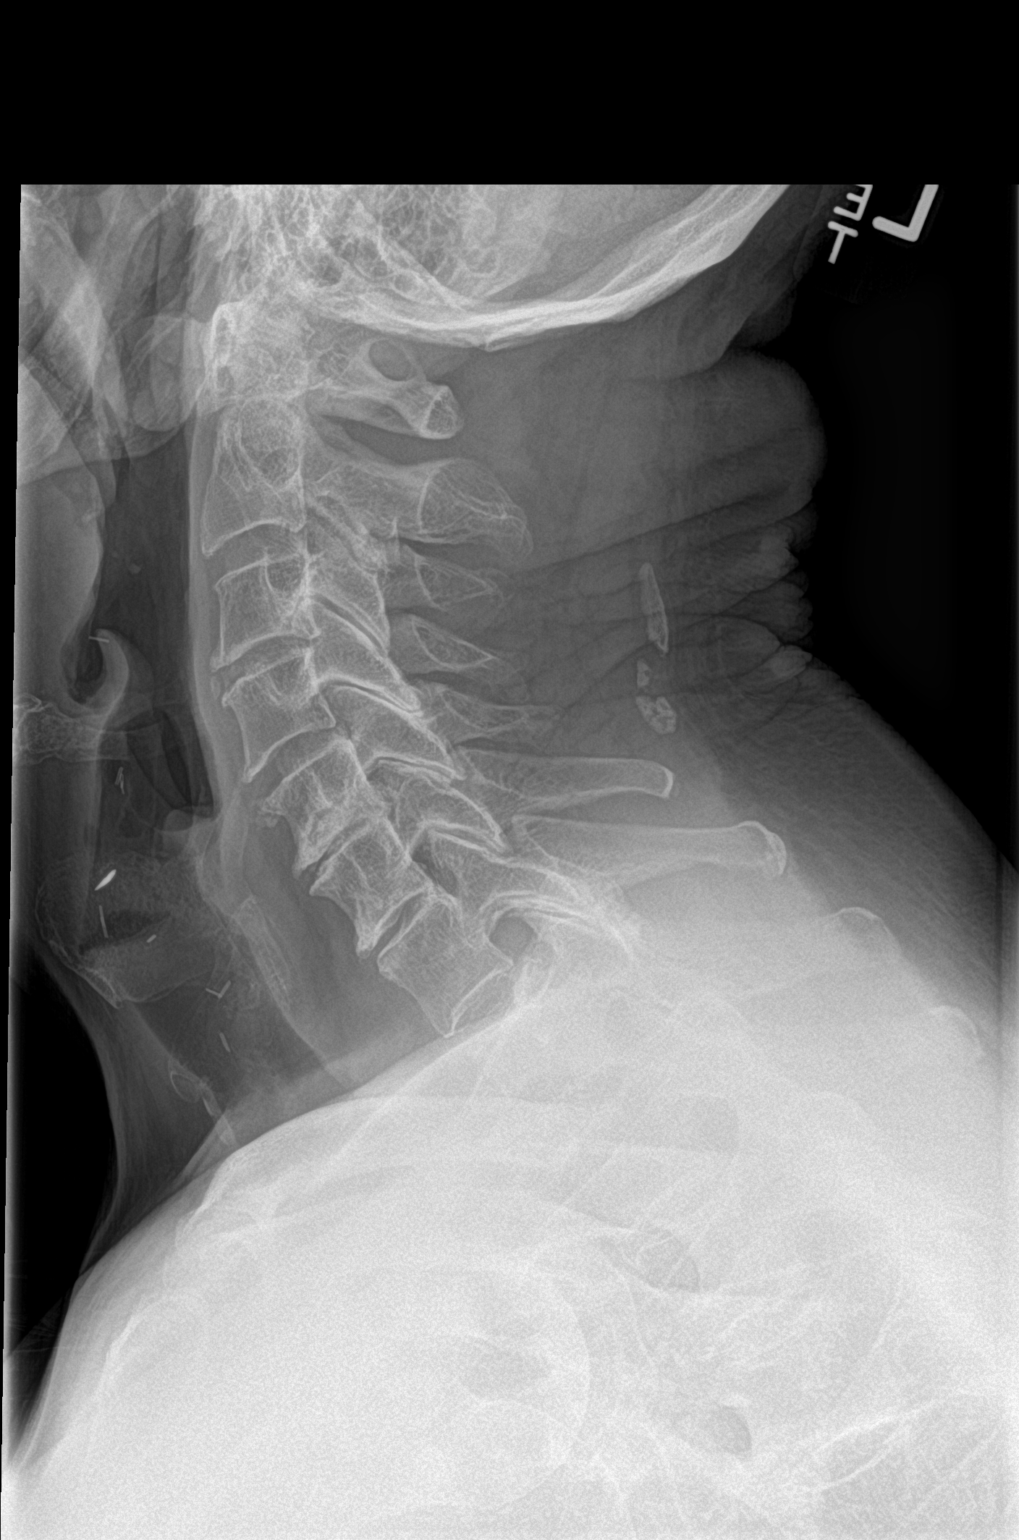

[c-spine ap]
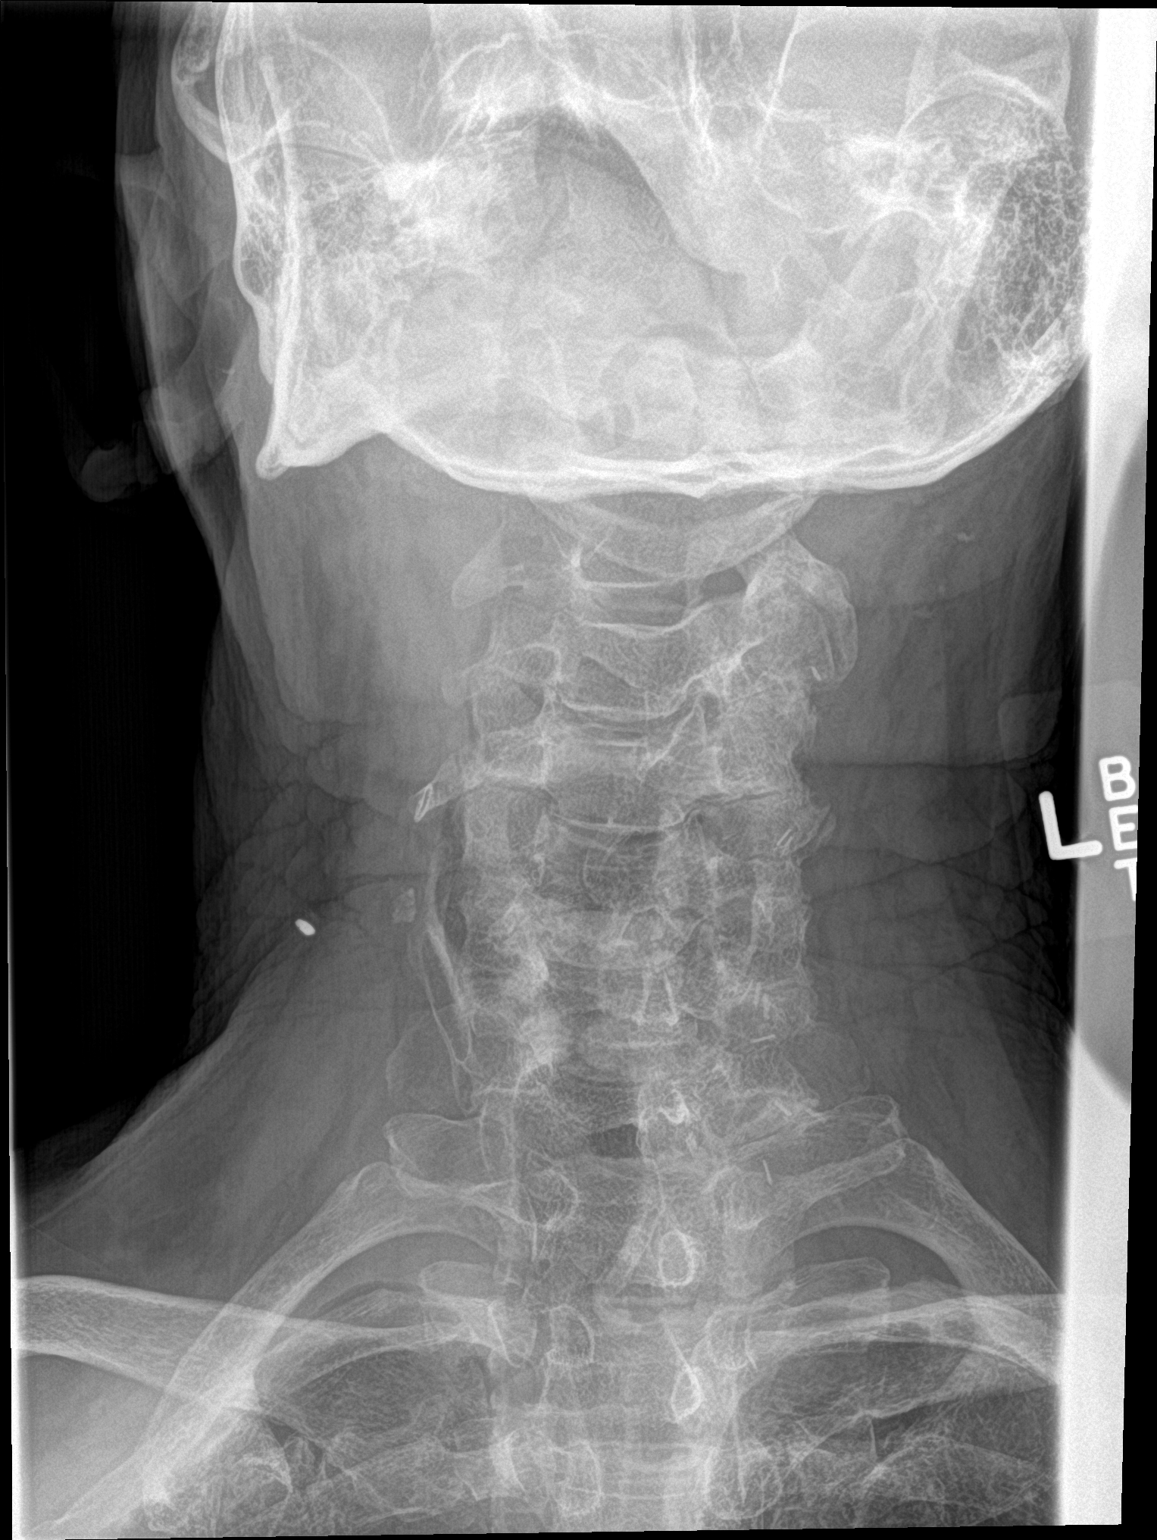

[c-spine open mouth]
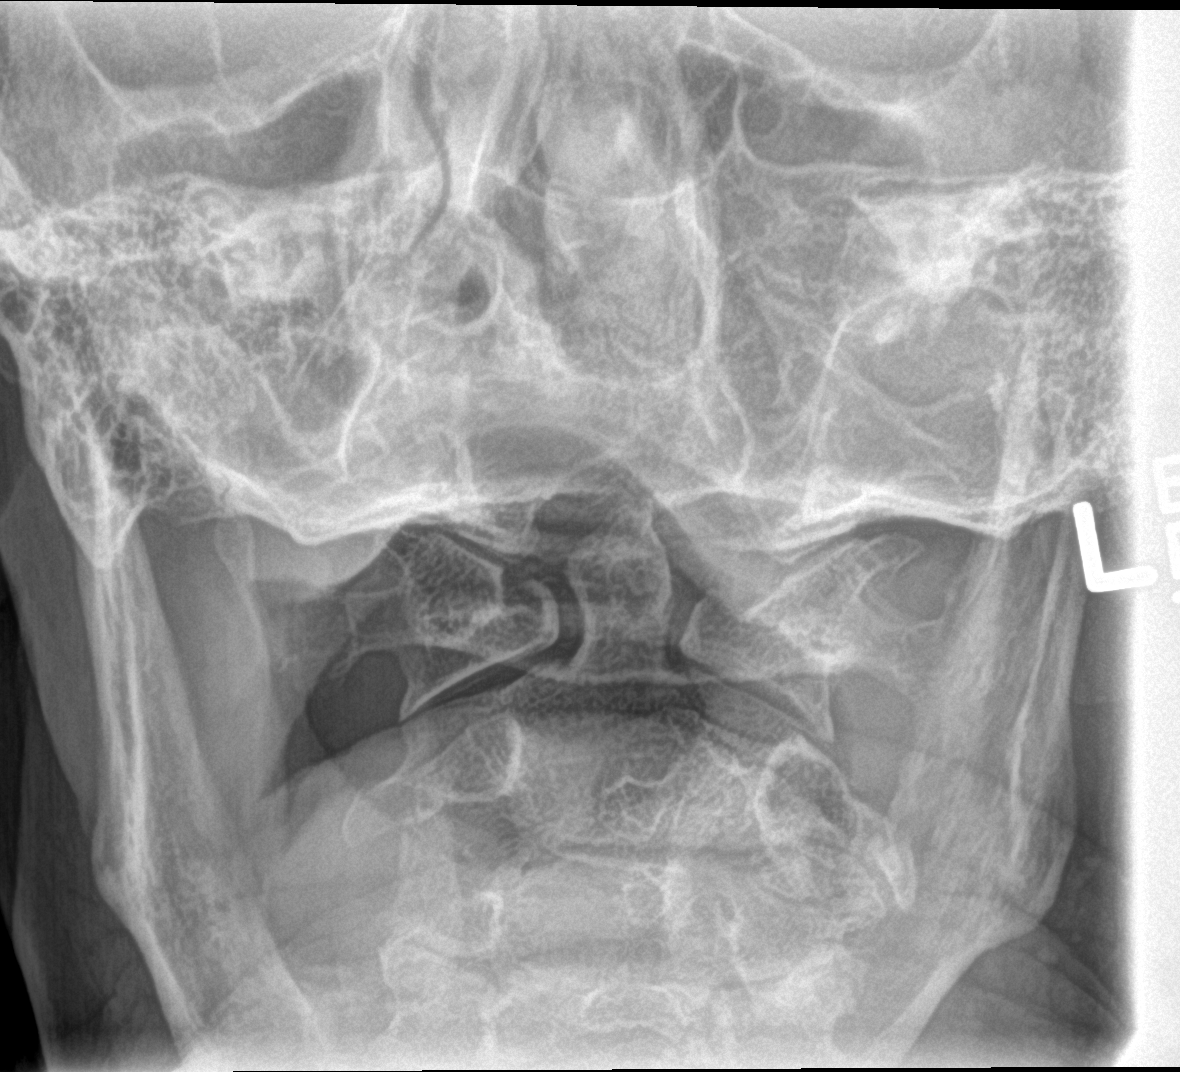

[3 of 3 positions shown; findings below may reference images not displayed]

FINDINGS: There is moderate mid cervical degenerative change, most notable at
C5-6 and C6-7. There is 1 mm retrolisthesis of C3 on C4, likely
degenerative. No evidence for acute fracture or subluxation.
Prevertebral soft tissues are normal in appearance.
IMPRESSION: Mid cervical spondylosis.  No evidence for acute  abnormality.

## 2017-10-10 IMAGING — CT CT HIP*L* W/O CM
3 of 4 series · 17 of 34 positions shown, 19 images · non-contrast
Comparison: Left hip 01/12/2016.  CT abdomen and pelvis 03/03/2015.

CLINICAL DATA: Patient fell at home. Left hip pain on
weight-bearing.

EXAM:
CT OF THE LEFT HIP WITHOUT CONTRAST
TECHNIQUE: Multidetector CT imaging of the left hip was performed according to
the standard protocol. Multiplanar CT image reconstructions were
also generated.

[Series 5: axial soft · axial · 0.39mm/px · z∈[-708,-574]mm · 8 of 81 slices shown, 10 images]
[im 7/81  soft-tissue]
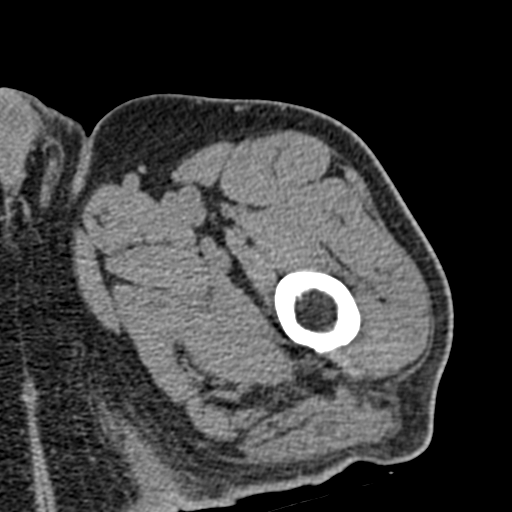
[im 7/81  bone]
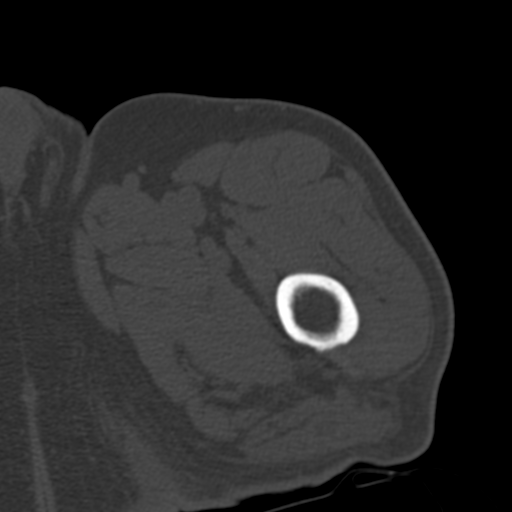
[im 19/81  bone]
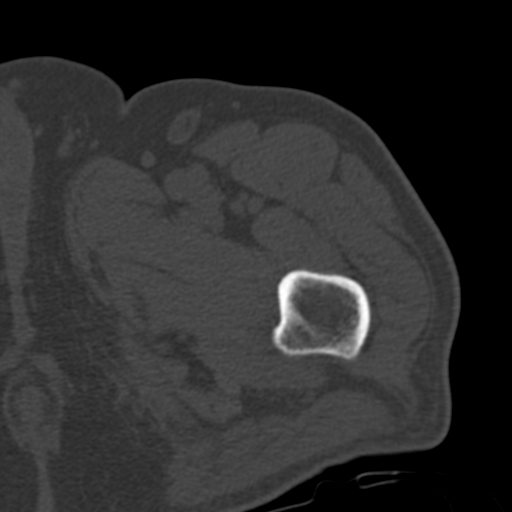
[im 25/81  bone]
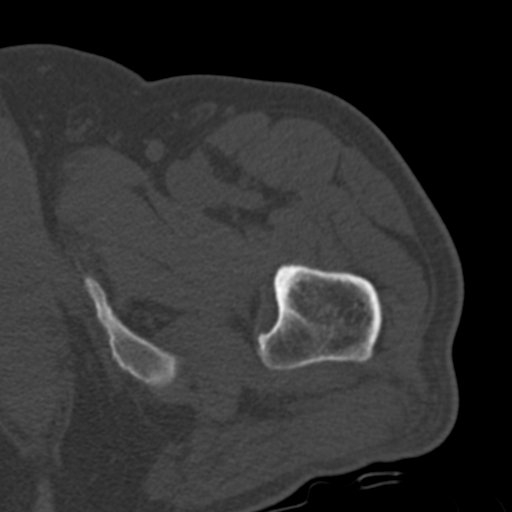
[im 37/81  bone]
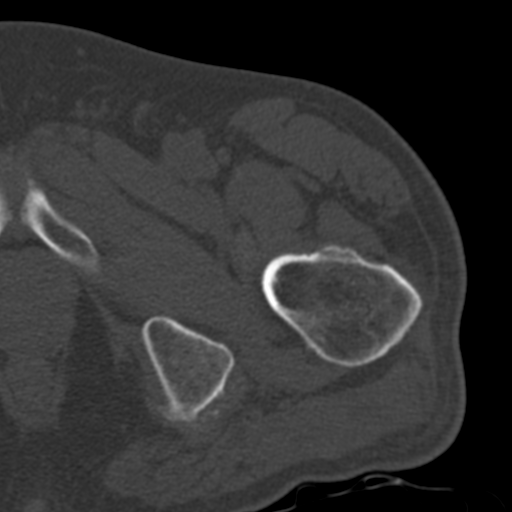
[im 44/81  soft-tissue]
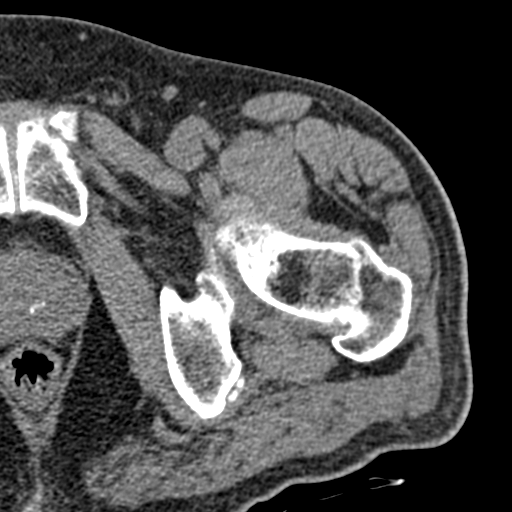
[im 44/81  bone]
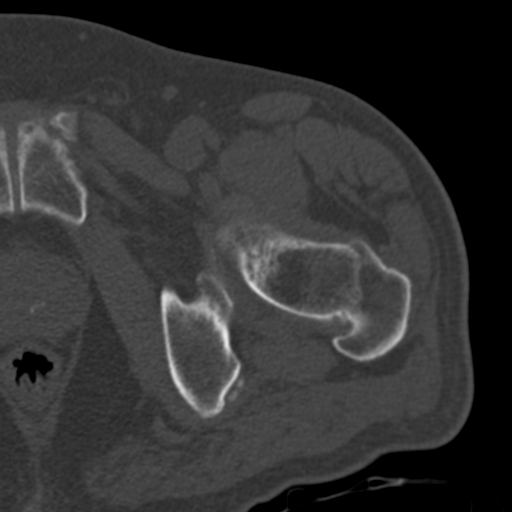
[im 56/81  bone]
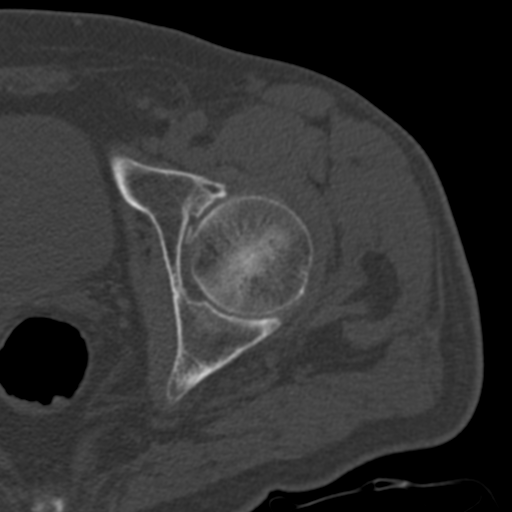
[im 62/81  bone]
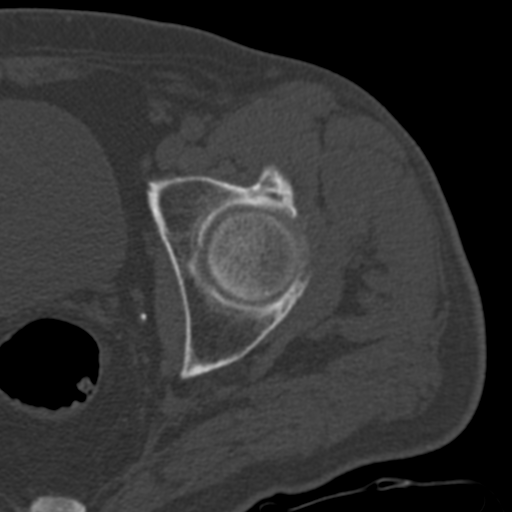
[im 74/81  bone]
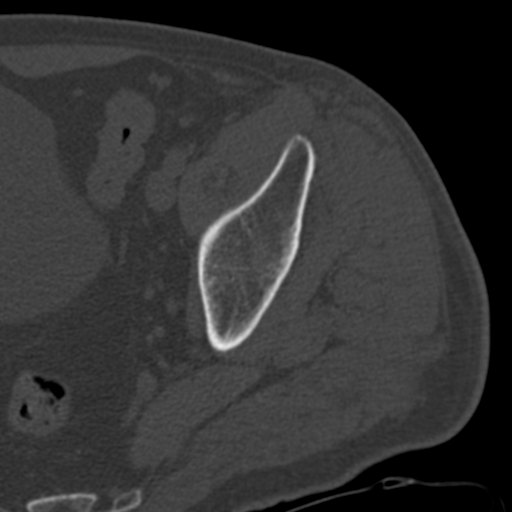

[Series 7: sagittal bone · sagittal · 0.28mm/px · 6 of 92 slices shown]
[im 16/92  bone]
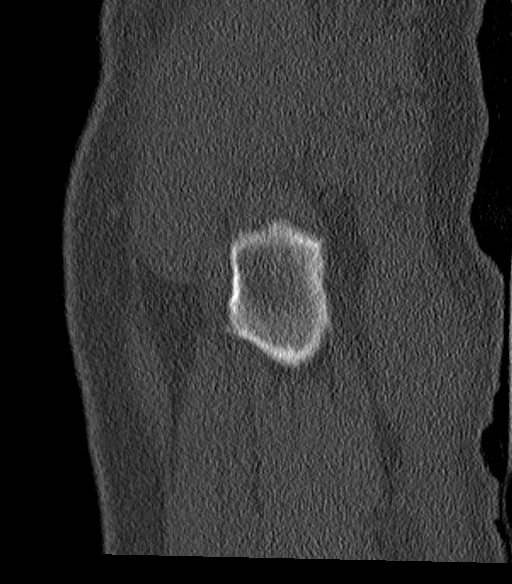
[im 30/92  soft-tissue]
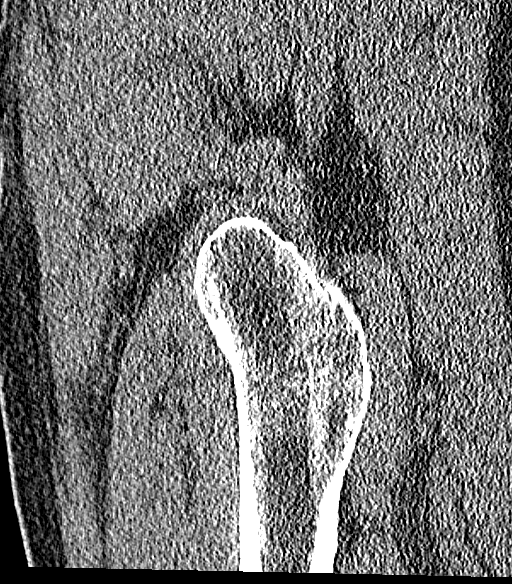
[im 31/92  bone]
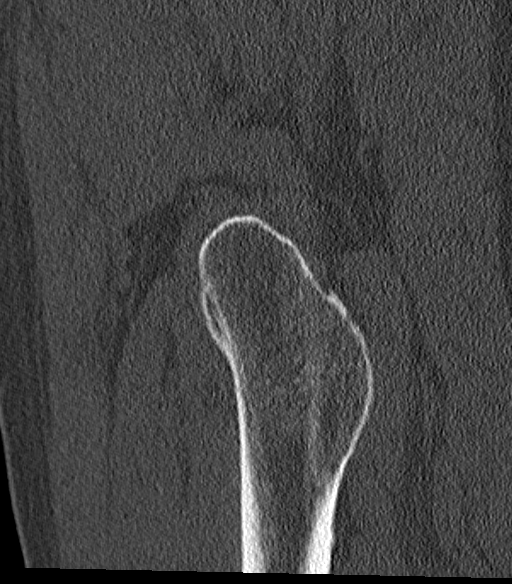
[im 46/92  bone]
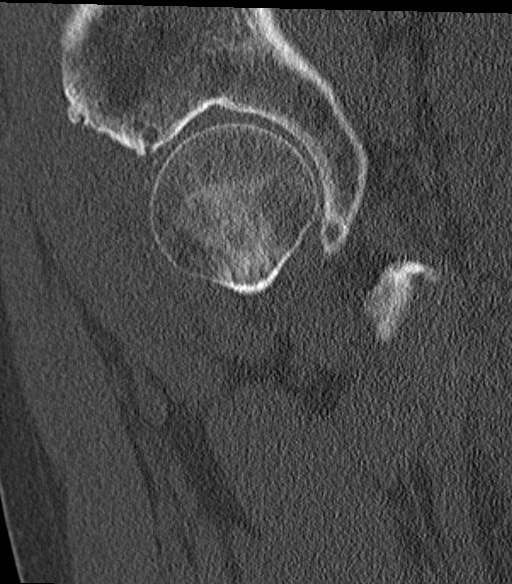
[im 61/92  bone]
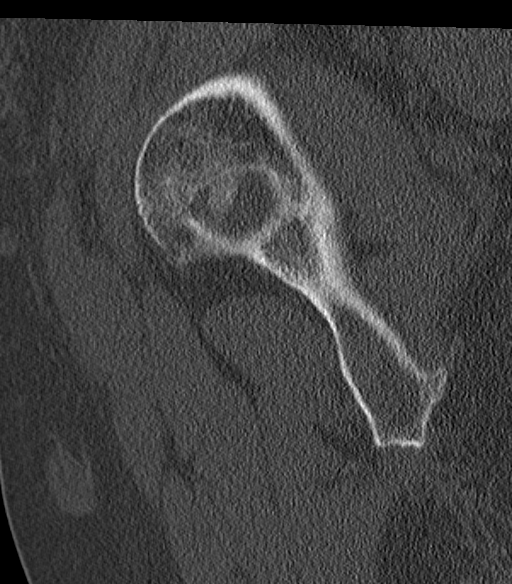
[im 76/92  bone]
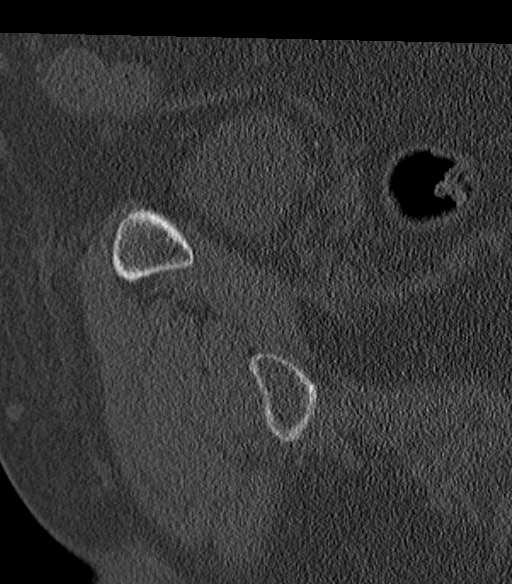

[Series 8: coronal soft · coronal · 0.37mm/px · 3 of 90 slices shown]
[im 18/90  bone]
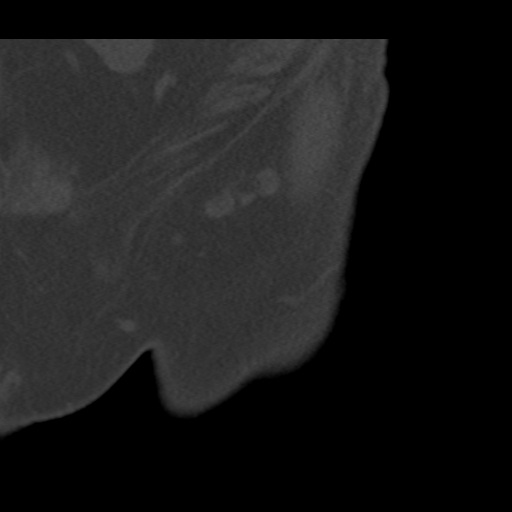
[im 36/90  bone]
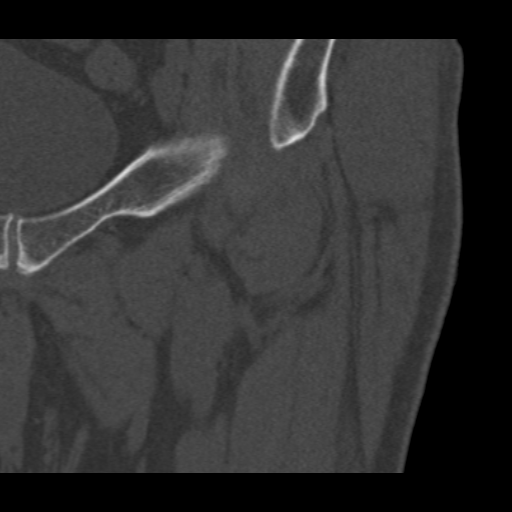
[im 54/90  bone]
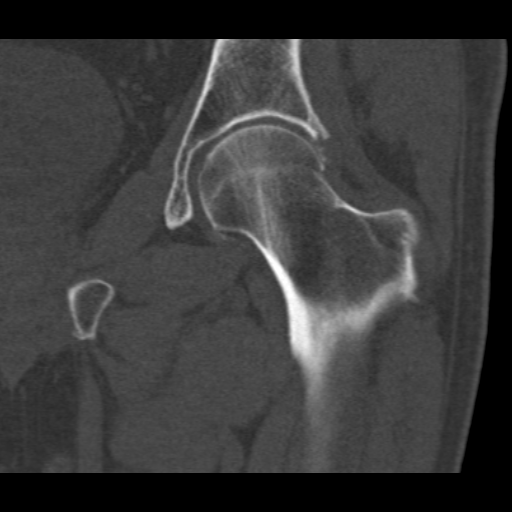

[17 of 34 positions shown; findings below may reference images not displayed]

FINDINGS: Mild degenerative changes in the left hip with narrowing of the
superior acetabular joint space. Mild calcification in the lateral
and medial hip joint suggesting degenerative calcification. Left hip
appears intact. No evidence of acute fracture or dislocation. The
bone cortex appears intact. No focal bone lesion or bone
destruction. Visualized portion of the left pelvis and pubic rami
appear intact. Soft tissues are unremarkable. No evidence of
hematoma or infiltration in the left hip. Incidental note of
enlarged prostate with mild prostate calcification.
IMPRESSION: Degenerative changes in the left hip. No acute or displaced fracture
identified.

## 2017-12-12 DEATH — deceased

## 2018-07-17 ENCOUNTER — Telehealth: Payer: Self-pay | Admitting: Emergency Medicine

## 2019-01-09 NOTE — Progress Notes (Signed)
REVIEWED-NO ADDITIONAL RECOMMENDATIONS.
